# Patient Record
Sex: Male | Born: 1935
Health system: Southern US, Community
[De-identification: ages and names within clinical notes are randomized; demographics above are authoritative.]

## PROBLEM LIST (undated history)

## (undated) DIAGNOSIS — N529 Male erectile dysfunction, unspecified: Secondary | ICD-10-CM

## (undated) DIAGNOSIS — K219 Gastro-esophageal reflux disease without esophagitis: Secondary | ICD-10-CM

## (undated) DIAGNOSIS — N400 Enlarged prostate without lower urinary tract symptoms: Secondary | ICD-10-CM

## (undated) DIAGNOSIS — E785 Hyperlipidemia, unspecified: Secondary | ICD-10-CM

## (undated) DIAGNOSIS — D696 Thrombocytopenia, unspecified: Secondary | ICD-10-CM

## (undated) DIAGNOSIS — R351 Nocturia: Secondary | ICD-10-CM

## (undated) DIAGNOSIS — Z87820 Personal history of traumatic brain injury: Secondary | ICD-10-CM

## (undated) DIAGNOSIS — Z85828 Personal history of other malignant neoplasm of skin: Secondary | ICD-10-CM

## (undated) DIAGNOSIS — K222 Esophageal obstruction: Secondary | ICD-10-CM

## (undated) DIAGNOSIS — K759 Inflammatory liver disease, unspecified: Secondary | ICD-10-CM

## (undated) DIAGNOSIS — K5790 Diverticulosis of intestine, part unspecified, without perforation or abscess without bleeding: Secondary | ICD-10-CM

## (undated) DIAGNOSIS — E039 Hypothyroidism, unspecified: Secondary | ICD-10-CM

## (undated) DIAGNOSIS — I259 Chronic ischemic heart disease, unspecified: Secondary | ICD-10-CM

## (undated) DIAGNOSIS — I10 Essential (primary) hypertension: Secondary | ICD-10-CM

## (undated) DIAGNOSIS — I214 Non-ST elevation (NSTEMI) myocardial infarction: Secondary | ICD-10-CM

## (undated) DIAGNOSIS — Z87442 Personal history of urinary calculi: Secondary | ICD-10-CM

## (undated) DIAGNOSIS — R350 Frequency of micturition: Secondary | ICD-10-CM

## (undated) HISTORY — DX: Hypothyroidism, unspecified: E03.9

## (undated) HISTORY — DX: Essential (primary) hypertension: I10

## (undated) HISTORY — DX: Male erectile dysfunction, unspecified: N52.9

## (undated) HISTORY — DX: Hyperlipidemia, unspecified: E78.5

## (undated) HISTORY — PX: APPENDECTOMY: SHX54

## (undated) HISTORY — DX: Chronic ischemic heart disease, unspecified: I25.9

## (undated) HISTORY — DX: Gastro-esophageal reflux disease without esophagitis: K21.9

## (undated) HISTORY — DX: Benign prostatic hyperplasia without lower urinary tract symptoms: N40.0

## (undated) HISTORY — DX: Non-ST elevation (NSTEMI) myocardial infarction: I21.4

---
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
## 1995-05-16 HISTORY — PX: CORONARY ARTERY BYPASS GRAFT: SHX141

## 1997-09-13 ENCOUNTER — Emergency Department (HOSPITAL_COMMUNITY): Admission: EM | Admit: 1997-09-13 | Discharge: 1997-09-13 | Payer: Self-pay | Admitting: Emergency Medicine

## 2000-09-15 ENCOUNTER — Encounter: Payer: Self-pay | Admitting: Emergency Medicine

## 2000-09-15 ENCOUNTER — Emergency Department (HOSPITAL_COMMUNITY): Admission: EM | Admit: 2000-09-15 | Discharge: 2000-09-16 | Payer: Self-pay | Admitting: Emergency Medicine

## 2000-09-16 ENCOUNTER — Encounter: Payer: Self-pay | Admitting: Emergency Medicine

## 2000-11-02 ENCOUNTER — Encounter: Payer: Self-pay | Admitting: Urology

## 2000-11-02 ENCOUNTER — Encounter: Admission: RE | Admit: 2000-11-02 | Discharge: 2000-11-02 | Payer: Self-pay | Admitting: Urology

## 2000-11-27 ENCOUNTER — Encounter: Admission: RE | Admit: 2000-11-27 | Discharge: 2000-11-27 | Payer: Self-pay | Admitting: Urology

## 2000-11-27 ENCOUNTER — Encounter: Payer: Self-pay | Admitting: Urology

## 2000-12-31 ENCOUNTER — Ambulatory Visit (HOSPITAL_COMMUNITY): Admission: RE | Admit: 2000-12-31 | Discharge: 2000-12-31 | Payer: Self-pay | Admitting: Urology

## 2000-12-31 ENCOUNTER — Encounter: Payer: Self-pay | Admitting: Urology

## 2001-01-17 ENCOUNTER — Encounter: Payer: Self-pay | Admitting: Urology

## 2001-01-17 ENCOUNTER — Encounter: Admission: RE | Admit: 2001-01-17 | Discharge: 2001-01-17 | Payer: Self-pay | Admitting: Urology

## 2003-07-08 ENCOUNTER — Inpatient Hospital Stay (HOSPITAL_COMMUNITY): Admission: RE | Admit: 2003-07-08 | Discharge: 2003-07-12 | Payer: Self-pay | Admitting: Gastroenterology

## 2003-07-13 ENCOUNTER — Inpatient Hospital Stay (HOSPITAL_COMMUNITY): Admission: EM | Admit: 2003-07-13 | Discharge: 2003-07-16 | Payer: Self-pay | Admitting: *Deleted

## 2003-09-18 ENCOUNTER — Ambulatory Visit (HOSPITAL_COMMUNITY): Admission: RE | Admit: 2003-09-18 | Discharge: 2003-09-18 | Payer: Self-pay | Admitting: Gastroenterology

## 2003-09-29 ENCOUNTER — Encounter: Admission: RE | Admit: 2003-09-29 | Discharge: 2003-09-29 | Payer: Self-pay | Admitting: Gastroenterology

## 2006-01-03 ENCOUNTER — Inpatient Hospital Stay (HOSPITAL_BASED_OUTPATIENT_CLINIC_OR_DEPARTMENT_OTHER): Admission: RE | Admit: 2006-01-03 | Discharge: 2006-01-03 | Payer: Self-pay | Admitting: Cardiology

## 2008-05-15 DIAGNOSIS — I214 Non-ST elevation (NSTEMI) myocardial infarction: Secondary | ICD-10-CM

## 2008-05-15 HISTORY — DX: Non-ST elevation (NSTEMI) myocardial infarction: I21.4

## 2008-09-07 ENCOUNTER — Ambulatory Visit: Payer: Self-pay | Admitting: Cardiology

## 2008-09-08 ENCOUNTER — Inpatient Hospital Stay (HOSPITAL_COMMUNITY): Admission: EM | Admit: 2008-09-08 | Discharge: 2008-09-10 | Payer: Self-pay | Admitting: *Deleted

## 2008-09-08 HISTORY — PX: CARDIAC CATHETERIZATION: SHX172

## 2009-03-19 ENCOUNTER — Encounter: Admission: RE | Admit: 2009-03-19 | Discharge: 2009-03-19 | Payer: Self-pay | Admitting: Cardiology

## 2010-03-23 ENCOUNTER — Ambulatory Visit: Payer: Self-pay | Admitting: Cardiology

## 2010-04-27 ENCOUNTER — Ambulatory Visit: Payer: Self-pay | Admitting: Cardiology

## 2010-07-27 ENCOUNTER — Ambulatory Visit (INDEPENDENT_AMBULATORY_CARE_PROVIDER_SITE_OTHER): Payer: Self-pay | Admitting: Cardiology

## 2010-07-27 DIAGNOSIS — I2510 Atherosclerotic heart disease of native coronary artery without angina pectoris: Secondary | ICD-10-CM

## 2010-08-24 LAB — DIFFERENTIAL
Basophils Absolute: 0 10*3/uL (ref 0.0–0.1)
Basophils Relative: 0 % (ref 0–1)
Eosinophils Absolute: 0.1 10*3/uL (ref 0.0–0.7)
Eosinophils Relative: 1 % (ref 0–5)
Monocytes Absolute: 0.6 10*3/uL (ref 0.1–1.0)
Neutro Abs: 4.3 10*3/uL (ref 1.7–7.7)

## 2010-08-24 LAB — CK TOTAL AND CKMB (NOT AT ARMC)
CK, MB: 14.2 ng/mL — ABNORMAL HIGH (ref 0.3–4.0)
Relative Index: 5.2 — ABNORMAL HIGH (ref 0.0–2.5)
Total CK: 271 U/L — ABNORMAL HIGH (ref 7–232)

## 2010-08-24 LAB — LIPID PANEL
HDL: 53 mg/dL (ref >39)
Total CHOL/HDL Ratio: 2.4 RATIO
Triglycerides: 34 mg/dL (ref <150)

## 2010-08-24 LAB — BASIC METABOLIC PANEL
BUN: 17 mg/dL (ref 6–23)
CO2: 26 mEq/L (ref 19–32)
Chloride: 103 mEq/L (ref 96–112)
Chloride: 105 mEq/L (ref 96–112)
Creatinine, Ser: 1.2 mg/dL (ref 0.4–1.5)
Creatinine, Ser: 1.21 mg/dL (ref 0.4–1.5)
GFR calc Af Amer: >60 mL/min (ref >60)
GFR calc Af Amer: >60 mL/min (ref >60)
GFR calc non Af Amer: 58 mL/min — ABNORMAL LOW (ref >60)
Potassium: 3.8 mEq/L (ref 3.5–5.1)
Potassium: 4.2 mEq/L (ref 3.5–5.1)

## 2010-08-24 LAB — CBC
HCT: 40.7 % (ref 39.0–52.0)
Hemoglobin: 14 g/dL (ref 13.0–17.0)
MCHC: 33.4 g/dL (ref 30.0–36.0)
MCHC: 34.3 g/dL (ref 30.0–36.0)
MCV: 97.7 fL (ref 78.0–100.0)
MCV: 97.9 fL (ref 78.0–100.0)
MCV: 98.4 fL (ref 78.0–100.0)
Platelets: 104 10*3/uL — ABNORMAL LOW (ref 150–400)
RBC: 4.19 MIL/uL — ABNORMAL LOW (ref 4.22–5.81)
RBC: 4.77 MIL/uL (ref 4.22–5.81)
RDW: 14.3 % (ref 11.5–15.5)
WBC: 4.4 10*3/uL (ref 4.0–10.5)
WBC: 5 10*3/uL (ref 4.0–10.5)
WBC: 6.1 10*3/uL (ref 4.0–10.5)

## 2010-08-24 LAB — POCT I-STAT, CHEM 8
BUN: 19 mg/dL (ref 6–23)
Calcium, Ion: 1.13 mmol/L (ref 1.12–1.32)
Creatinine, Ser: 1.3 mg/dL (ref 0.4–1.5)
Glucose, Bld: 124 mg/dL — ABNORMAL HIGH (ref 70–99)
TCO2: 24 mmol/L (ref 0–100)

## 2010-08-24 LAB — CARDIAC PANEL(CRET KIN+CKTOT+MB+TROPI)
Relative Index: 3.8 — ABNORMAL HIGH (ref 0.0–2.5)
Troponin I: 1.51 ng/mL (ref 0.00–0.06)

## 2010-08-24 LAB — URINALYSIS, ROUTINE W REFLEX MICROSCOPIC
Hgb urine dipstick: NEGATIVE
Ketones, ur: NEGATIVE mg/dL
Protein, ur: NEGATIVE mg/dL
Urobilinogen, UA: 1 mg/dL (ref 0.0–1.0)

## 2010-08-24 LAB — MAGNESIUM: Magnesium: 1.9 mg/dL (ref 1.5–2.5)

## 2010-08-24 LAB — APTT: aPTT: 34 seconds (ref 24–37)

## 2010-08-24 LAB — TROPONIN I: Troponin I: 2.05 ng/mL (ref 0.00–0.06)

## 2010-09-27 NOTE — Discharge Summary (Signed)
NAME:  Patrick [name], [place]              ACCOUNT NO.:  [id number]   MEDICAL RECORD NO.:  [id number]           PATIENT TYPE:   LOCATION:                                 FACILITY:   PHYSICIAN:  Colleen Can. Deborah Chalk, M.D.DATE OF BIRTH:  26-Dec-1926   DATE OF ADMISSION:  DATE OF DISCHARGE:                               DISCHARGE SUMMARY   DISCHARGE DIAGNOSES:  1. Non-ST elevation myocardial infarction status post cardiac      catheterization showing normal left ventricular function, totally      occluded right coronary, totally occluded left circumflex, totally      occluded left anterior descending in the midportion with a proximal      diffuse 60% narrowing prior to a reasonably large first diagonal      vessel.  The left internal mammary graft is patent in diagonal 2      and the left anterior descending, saphenous vein graft to the acute      margin is patent, and there is a patent saphenous vein graft to the      intermediate obtuse marginal 2 and obtuse marginal 3 sequentially.      In summary, all vein grafts are open with the exception of the limb      of the vein graft to the posterior descending vessel that was a      sequential graft and the anastomosis of the left internal mammary      artery graft to the diagonal 1.  It does appear that both those      areas have what appear to be now satisfactory flow.  Mr. Laws      will continue with medical management.  2. Known ischemic heart disease with remote bypass surgery in 1997.  3. Significant situational stress.  4. Hyperlipidemia.  5. Hypothyroidism.  6. Benign prostatic hypertrophy.  7. Hypertension.  8. Gastroesophageal reflux disease.  9. History of gout.  10.History of peptic ulcer disease.   HISTORY OF PRESENT ILLNESS:  The patient is a very pleasant 77 year old  white male who has multiple medical problems.  He presented for elective  admission.  He several days prior to admission had had an episode of  significant chest  discomfort, described as a crampy and indigestion-like  sensation.  It was associated with shortness of breath.  This occurred  in midst of an argument with his significant other.  He did not take any  medicines for this discomfort and over the course of the 10-20 minutes,  it abated.  On the morning prior to admission, he again had significant  chest discomfort.  He was seen in the office.  His EKG was unremarkable.  He subsequently had labs drawn, and plans were made for him to have an  outpatient stress test.  Unfortunately, his troponins were positive.  The patient was then called and brought to the emergency room and  subsequently admitted.  He was currently pain free.   Please see the history and physical for further patient presentation and  profile.   LABORATORY DATA:  On admission, his white count was  6, hemoglobin 15,  hematocrit 46, platelets were 129.  Chemistry showed sodium 136,  potassium 3.8, chloride 103, CO2 21, BUN 17, creatinine 1.2, and a  glucose of 133.  His peak troponin was 2.0, peak CK-MB was 14.5.   His chest x-ray showed no active disease.  His EKG showed no acute  changes.   HOSPITAL COURSE:  The patient was admitted.  He was placed on IV  nitroglycerin and IV heparin.  We made plans for him to have cardiac  catheterization the following afternoon.  That procedure was performed  without any new known complications, the results are as noted above.  It  is felt that he can be managed medically.  Postprocedure, he was  transferred to 2500.  He has been placed on beta-blocker therapy and has  also had antiplatelet therapy reestablished as well.  He has basically  done well throughout the remainder of his hospitalization with no  further episodes of chest pain.  He has been up and ambulatory without  problems and felt to be a satisfactory candidate for discharge today.   DISCHARGE CONDITION:  Stable.   DISCHARGE DIET:  Low salt, heart healthy.   ACTIVITY:   Increased slowly.  He is not to drive or engage in sexual  activity.   He is to use an ice pack if needed to the groin.   DISCHARGE MEDICINES:  1. Uroxatral 10 mg a day.  2. Allopurinol 300 mg a day.  3. Lexapro 10 mg a day.  4. Synthroid 75 mcg a day.  5. Xanax p.r.n.  6. Calcium tablets as before.  7. Lipitor has been switched over to Crestor 10 mg a day.  8. Plavix 75 mg a day.  9. Enteric-coated aspirin 325 a day.  10.Nitroglycerin p.r.n.  11.Lopressor 25 b.i.d.  12.Pepcid 20 mg each night.   We plan on seeing him back in the office in 1 week, certainly sooner if  any problems would arise in the interim.   Greater than 30 minutes spent for discharge.      Sharlee Blew, N.P.      Colleen Can. Deborah Chalk, M.D.  Electronically Signed    LC/MEDQ  D:  09/10/2008  T:  09/10/2008  Job:  [id number]

## 2010-09-27 NOTE — Cardiovascular Report (Signed)
NAME:  Patrick [name], Patrick [name]              ACCOUNT NO.:  [id number]   MEDICAL RECORD NO.:  [id number]          PATIENT TYPE:  INP   LOCATION:  2502                         FACILITY:  [facility]   PHYSICIAN:  Colleen Can. Deborah Chalk, M.D.DATE OF BIRTH:  07/10/1926   DATE OF PROCEDURE:  09/08/2008  DATE OF DISCHARGE:                            CARDIAC CATHETERIZATION   PROCEDURE:  Left heart catheterization with selective coronary  angiography, left ventricular angiography, and saphenous vein graft  angiography x2 with angiography of the left internal mammary artery.   TYPE AND SITE OF ENTRY:  Percutaneous right femoral artery.   CATHETERS:  A 6-French 4-curved Judkins right and left coronary  catheter, 6-French pigtail ventriculographic catheter, and 6-French left  internal mammary graft catheter.   CONTRAST MATERIAL:  Omnipaque.   MEDICATIONS GIVEN PRIOR TO PROCEDURE:  Valium 10 mg p.o.   MEDICATIONS GIVEN DURING PROCEDURE:  Versed 2 mg IV.   COMMENTS:  The patient tolerated the procedure well.   ANGIOGRAPHIC DATA:  The aortic pressure is 125/64, LV was 133/5-21.  There is no aortic valve gradient noted on pullback.   The left ventricular angiogram was performed in the RAO position.  Overall cardiac size and silhouette were normal.  Left ventricular  function was normal with a global ejection fraction of 60%.   1. Left main coronary artery was normal.  2. Left anterior descending had proximal diffuse disease of      approximately 60% narrowing.  After this narrowing, a moderately      large diagonal vessel rose.  It had 50-60% narrowing in the      proximal portion of it.  The left anterior descending was      subsequently occluded after that but only after the origin of least      2 large septal perforating branches.  3. Left circumflex was totally occluded within the first proximal 2      cm.  4. Right coronary artery was totally occluded within the proximal 3-4      cm.  5. Saphenous  vein graft in a sequential manner to the intermediate,      obtuse marginal 2, and obtuse marginal 3 was widely patent.  In the      body of the graft, after the insertion in the second obtuse      marginal, there was 50% narrowing.  This was located at a valve in      the vein.  This was just proximal to the insertion into the obtuse      marginal 2.  There was good distal runoff.  6. Saphenous vein graft to the acute marginal and posterior      descending.  This saphenous vein graft has somewhat diffuse      proximal disease of 50-60% narrowing.  The vein graft inserted into      the large acute marginal and this supplied blood flow to the      inferior wall of the heart.  There was subsequent collaterals back      to the posterior descending vessel.  The limb of the saphenous  vein      graft that would have appeared to have gone directly to the      posterior descending is occluded just at the level of the acute      marginal vessel.  7. The left internal mammary graft originally went to the diagonal 1,      diagonal 2, and left anterior descending in a sequential manner.      The left internal mammary artery is patent to the diagonal 2 and to      the left anterior descending and has a good distal runoff.  There      does not appear to be any graft flow to the first diagonal vessel.      The first diagonal vessel receives its flow directly from the      native circulation.   OVERALL IMPRESSION:  1. Normal left ventricular function.  2. Totally occluded right coronary artery, totally occluded left      circumflex, and totally occluded left anterior descending in the      midportion with proximal diffuse 60% narrowing prior to a      reasonably large first diagonal vessel.  3. Patent left internal mammary graft to diagonal 2 and left anterior      descending, patent saphenous vein graft to the acute marginal, with      a patent saphenous vein graft to the intermediate, obtuse  marginal      2, and obtuse marginal 3 mL sequentially.   In summary, all the vein grafts are open with the exception of the limb  of the vein graft to the posterior descending vessel that was a  sequential graft and the anastomosis off the left internal mammary  artery graft to the diagonal 1.  It does appear that both of those areas  have what appeared to be satisfactory flow.   DISCUSSION:  It is felt that probably under the situation of stress,  collateral flow was diminished leading to the subendocardial infarction.  We will manage Patrick [name] with medical management.      Colleen Can. Deborah Chalk, M.D.  Electronically Signed     SNT/MEDQ  D:  09/08/2008  T:  09/09/2008  Job:  [id number]

## 2010-09-27 NOTE — H&P (Signed)
NAME:  Patrick [name], [place]              ACCOUNT NO.:  [id number]   MEDICAL RECORD NO.:  [id number]          PATIENT TYPE:  INP   LOCATION:  1823                         FACILITY:  [facility]   PHYSICIAN:  Darryl D. Prime, MD    DATE OF BIRTH:  11-Sep-1926   DATE OF ADMISSION:  09/07/2008  DATE OF DISCHARGE:                              HISTORY & PHYSICAL   CODE STATUS:  Full code.   PRIMARY CARE PHYSICIAN/CARDIOLOGIST:  Colleen Can. Deborah Chalk, M.D.   CHIEF COMPLAINT:  Chest pain.   HISTORY OF PRESENT ILLNESS:  Patrick [name] is a 77 year old male with a  history of coronary artery disease.  He is status post bypass grafting  10-15 years ago who comes in with chest pain.  The patient notes on  September 06, 2008, at around 9 p.m., he was arguing with his wife and notes  severe chest discomfort, cramp, left precordial area with no radiation.  No sweats or sickness to the stomach but did have associated shortness  of breath.  He did not take anything for it over the course of 10-20  minutes.  The patient notes he bent down his head in order to relieve  the pain.  He notes the chest pain, however, is similar to his anginal  equivalent.  The patient, this morning, again had the pain that is less  severe, but it reminded him that he had to get it taken care of.  He  called Dr. Ronnald Nian nurse and then went to clinic there and had EKG  done which was unremarkable.  Labs were drawn including cardiac markers.  Cardiac labs were called to me at around 11:00 p.m. from [facility] which got labs from [employer] who noted his troponin to be  2.6, MV 18.6.  His CK was 296.  I then got a hold of the patient and had  him come to the emergency room.  The patient, in the emergency room,  received 25 mg of aspirin and was placed on heparin drip.  He has had no  chest pain since this morning.   ALLERGIES:  HALCION CAUSES HALLUCINATION, ACE INHIBITOR CAUSE A COUGH.   MEDICATIONS:  He is unsure of what medications  he is on, did not bring  his list.  They are going to go get that list and hopefully we should  have it in the next hour or two.  In 2007, he was on:  1. Lipitor 10 mg daily.  2. Synthroid 0.075 mg daily.  3. Aspirin daily.  4. Diovan 160/12.5 mg daily.  5. Allopurinol daily.  6. Prilosec daily.   PAST MEDICAL HISTORY:  As above.  Last cardiac catheterization was on  January 03, 2006.  It showed total occluded left circumflex, totally  occluded left main coronary artery essentially normal.  LAD had a 70-80%  stenosis approximately just before the level of the first diagonal with  competitive flow distally from the bypass graft.  The saphenous vein  graft to the obtuse marginal PDA and a saphenous vein graft that had  mild proximal narrowing with tortuosity.  He had a saphenous vein graft  going to the obtuse marginal 1 and then to obtuse marginal 2 in a  sequential fashion with sequential saphenous vein graft with the vein  graft then largely patent with 50% narrowing at the level of the  insertion of OM 1.  No intervention was done at that time, although,  there was a question of whether or not he was having angina from the 80%  native LAD proximal lesion versus the lesion on the vein graft.  The  patient has done well since then.  The patient had last stress test he  cannot recall, but the last one he notes was unremarkable.  He was  scheduled for a stress test this Wednesday with the chest pain he was  having.  1. Status post appendectomy, has history of small bowel obstruction,      incarcerated hernia after the appendectomy.  2. History of hypertension.  3. Hyperlipidemia.  4. Gout.  5. Hypothyroidism.  6. Peptic ulcer disease.   SOCIAL HISTORY:  He is retired.  He is a former Conservation officer, nature, former  marine.  He also worked at [employer].  History of 5-6  years of two packs per day tobacco, quit 40 years ago.  No alcohol.   FAMILY HISTORY:  Positive for mother  who died due to complications of  congestive heart failure at the age of 39.   REVIEW OF SYSTEMS:  A 14-point review of systems is negative as stated  above.  He is full code.   PHYSICAL EXAMINATION:  VITAL SIGNS:  Temperature 98.2, pulse 79,  respirations 18, blood pressure 117/60.  GENERAL:  He is thin male lying flat in bed looking much younger than  his stated age in no acute distress.  HEENT:  Normocephalic, atraumatic.  Pupils equal, round and reactive to  light.  Extraocular movements intact.  Oropharynx is dry with no  thyromegaly, no lymphadenopathy.  No carotid bruits over the neck.  No  jugular venous distention.  LUNGS:  Clear to auscultation bilaterally.  CARDIOVASCULAR:  Regular rate and rhythm with no murmurs, rubs or  gallops, normal S1, S2.  No S3 or S4.  ABDOMEN:  Soft, nontender, nondistended.  There is no  hepatosplenomegaly.  EXTREMITIES:  No cyanosis, clubbing or edema .  NEUROLOGICAL:  Alert and oriented x4.  Cranial nerves II-XII grossly  intact.  Strength and sensation are grossly intact.  SKIN:  No rashes.   STUDIES:  Chest x-ray shows no acute cardiopulmonary disease.  EKG  showed normal sinus rhythm with event rate of 74 beats per minute with a  left anterior fascicular block.  There is a possible inferior posterior  infarct.  There is no major change compared to EKG done July 13, 2003.   LABORATORY DATA:  White count 6.1, hemoglobin 15.7, hematocrit 46.9,  platelets 129, segs of 71%, lymphocytes 18%.  Has previously platelet  count on July 14, 2003, showed a platelet count of 211.  Sodium 136,  potassium 3.8, chloride 103, bicarbonate 27, glucose 133.  Cardiac  markers at 30 minutes after midnight showed a CK MB of 9.1, troponin  0.41.  INR 1.1 with PT 14.5, PTT 34.   ASSESSMENT/PLAN:  This is a patient with a history of significant  coronary artery disease status post  coronary artery bypass grafting.  He has a history of his LV gram on August  2007 showed an ejection  fraction of 60% with normal wall  motion.  He has done very well since  his bypass surgery.  He now presents with chest pain, positive  troponins.  The story is concerning for unstable angina/NSTEMI.  At this  time, he will be admitted to the CCU and  be continued on heparin and will start Integrilin.  We will start a beta  blocker, metoprolol, 12.5 mg q.6 h and hold his ARB for now as he may  have a cardiac catheterization.  He will be on telemetry.  We will  continue his statin therapy, will increase Lipitor to 80 mg daily.  DVT  and GI prophylaxis will be ordered.      Darryl D. Prime, MD  Electronically Signed     DDP/MEDQ  D:  09/08/2008  T:  09/08/2008  Job:  [id number]

## 2010-09-30 NOTE — Consult Note (Signed)
NAME:  Patrick [name], Patrick [name] H                        ACCOUNT NO.:  [id number]   MEDICAL RECORD NO.:  [id number]                   PATIENT TYPE:  INP   LOCATION:  3701                                 FACILITY:  [facility]   PHYSICIAN:  Patrick Dare. Janee [name], M.D.             DATE OF BIRTH:  29-May-1926   DATE OF CONSULTATION:  07/09/2003  DATE OF DISCHARGE:                                   CONSULTATION   SURGERY CONSULTATION   REASON FOR CONSULTATION:  Abdominal wall hernia with small bowel  obstruction.   HISTORY OF PRESENT ILLNESS:  The patient is a 77 year old white male  admitted yesterday to Dr. Molly Maduro Buccini's service with nausea, vomiting,  abdominal pain.  This came on a couple of days ago with some associated  generalized weakness and malaise.  He had a large amount of vomiting  yesterday as well.  He has a little bit of dizziness associated with  weakness.  His last bowel movement was very small, was on Tuesday.  The  patient does note that he had a lump over his old appendectomy site down on  his abdomen for many years.  He has not noticed this getting significantly  bigger lately but he states it stuck out.  His work up in the hospital here  has included upper endoscopy by Dr. Matthias Hughs which showed hiatal hernia and a  large retained gastric content but no other significant abnormalities.  He  also underwent a CT scan of the abdomen and pelvis today which shows hernia  of small bowel through the abdominal wall with associated obstruction down  in the left lower quadrant.   PAST MEDICAL HISTORY:  Coronary artery disease, hypertension, kidney stones.   PAST SURGICAL HISTORY:  Coronary artery bypass grafting in 1998.  He had a  previous percutaneous transluminal coronary angioplasty prior to this.   SOCIAL HISTORY:  The patient does not drink or smoke.   CURRENT MEDICATIONS:  1. Toprol XL 25 mg p.o. daily.  2. Allopurinol 100 mg p.o. daily.  3. Protonix intravenously 40 mg daily.  4. Benadryl PRN.  5. Phenergan PRN.  6. Demerol PRN.   ALLERGIES:  HALCION.   REVIEW OF SYMPTOMS:  GENERAL:  He feels weak.  CARDIAC:  No current chest  pain.  PULMONARY:  No shortness of breath or other complaints.  GASTROINTESTINAL:  See history of present illness.  GENITOURINARY:  No  complaints.  MUSCULOSKELETAL:  No complaints.   PHYSICAL EXAMINATION:  VITAL SIGNS:  Temperature 98.8, blood pressure  120/74, heart rate 86, respirations 20, oxygen saturation 96% on room air.  GENERAL:  He is awake, alert and in no acute distress.  HEENT: Pupils equal, round, reactive to light.  Sclerae nonicteric.  NECK:  Supple with no masses.  LUNGS:  Clear to auscultation with normal respiratory excursion.  HEART:  Regular rate and rhythm with PMI palpable in left chest.  Remainder  of  the cardiothoracic examination has good distal pulses and no significant  peripheral edema.  ABDOMEN:  Moderately distended and tympanitic.  There are a few hyperactive  bowel sounds present.  There is mild tenderness throughout and he does have  a palpable incarcerated hernia over his old right lower quadrant incision at  the site of his old appendectomy site.  This does not reduce.  It is not red  or significantly tender to manipulation or palpation.  SKIN:  Warm and dry with no rashes.   LABORATORY DATA:  Review includes white blood cell count 11, hemoglobin  16.6, platelet count 182,000.  Liver function tests within normal limits.  Basic metabolic profile is within normal limits except for BUN of 26 and  glucose of 155.  Amylase is 107, lipase is 29   IMPRESSION:  Incarcerated incisional hernia of abdominal wall.   PLAN:  Plan is to take the patient to the operating room for release of his  small bowel obstruction and repair of this abdominal wall incisional hernia  at his old appendectomy site.  The procedure's risks and benefits were  discussed in detail with the patient as with Dr. Matthias Hughs and Dr.  Matthias Hughs  will speak with Dr. Deborah Chalk who is the patient's cardiologist as well.  The  patient is agreeable to surgery.  Questions were answered and we will  proceed this evening.                                               Patrick [name], M.D.    BET/MEDQ  D:  07/09/2003  T:  07/10/2003  Job:  [id number]

## 2010-09-30 NOTE — H&P (Signed)
NAME:  [place], [place] H                        ACCOUNT NO.:  [id number]   MEDICAL RECORD NO.:  [id number]                   PATIENT TYPE:  INP   LOCATION:  1823                                 FACILITY:  [facility]   PHYSICIAN:  Gabrielle Dare. Janee Morn, M.D.             DATE OF BIRTH:  22-Jul-1926   DATE OF ADMISSION:  07/13/2003  DATE OF DISCHARGE:                                HISTORY & PHYSICAL   CHIEF COMPLAINT:  Nausea and vomiting.   HISTORY OF PRESENT ILLNESS:  The patient is a 77 year old white male who  underwent repair of incarcerated right lower quadrant incisional hernia on  July 09, 2003 and a small bowel loop was trapped in the hernia and that  was reduced back into his abdomen. His hernia was repaired. Postoperatively  he recovered some GI function over the weekend and was quite insistent on  being discharged yesterday. Initially felt okay after he went home, but then  developed some vomiting during the night. He called our office and I advised  him to come to the emergency department where workup has revealed normal  white blood cell count, but an abdominal x-ray is consistent with small-  bowel obstruction. He denies any abdominal pain. There is no pain in his  incision site. No other complaints at this time.   PAST MEDICAL HISTORY:  1. Coronary artery disease, Dr. Duffy Rhody is his cardiologist.  2. Hypertension.  3. Kidney stones.   PAST SURGICAL HISTORY:  1. As above.  2. Coronary artery bypass grafting in 1998.  3. Multiple PTCAs prior to his coronary artery bypass graft.   SOCIAL HISTORY:  Does not drink or smoke.   CURRENT MEDICATIONS:  1. Toprol XL 25 mg p.o. daily.  2. Allopurinol 100 mg p.o. daily.  3. Pain medication he received on discharge.   ALLERGIES:  Halcion.   REVIEW OF SYSTEMS:  CARDIAC:  No chest pain. GENERAL:  Negative. PULMONARY:  No shortness of breath. GI:  Refer to the history of present illness. GU:  Negative. MUSCULOSKELETAL:   Negative.   PHYSICAL EXAMINATION:  GENERAL:  He is awake and alert.  VITAL SIGNS: Temperature 98.3, blood pressure 146/85, pulse 116,  respirations 18, saturations 99% on room air.  HEENT:  Pupils are equal and reactive. Sclerae is anicteric. He has a  nasogastric tube in place. Mucous membranes are somewhat dry.  NECK:  Supple.  LUNGS:  Clear to auscultation with good respiratory excursion.  HEART:  Regular S1, S2. PMI is palpable in the left chest.  ABDOMEN:  Moderately distended. Some bowel sounds are audible. There is no  significant tenderness. His incision is clean, dry, and intact with Steri-  Strips. No hepatosplenomegaly is noted. There is no palpable recurrence of  his hernia at this time.  SKIN:  Warm and dry with no rashes.  EXTREMITIES:  No large, significant edema.   LABORATORY DATA:  White blood cell count  6.0, sodium 136, potassium 3.6,  chloride 102, carbon dioxide 25, glucose 133. BUN 14, creatinine 1.3.  Calcium 8.7, platelets 200, hemoglobin 15.5. Abdominal series as above shows  distal small-bowel obstruction.   IMPRESSION/PLAN:  Recurrent distal small-bowel obstruction postoperatively.  He is not tender and has no white count elevation at this time. I will admit  him to the hospital, continue NG tube decompression, strict monitoring of  ins and outs. We will give some IV fluid resuscitation. I will do some  follow-up studies in the next day or two depending on how he progresses. I  have asked Dr. Roger Shelter to come by for management of his coronary  artery disease and hypertension. The above plan was discussed in detail with  the patient and his friend who is present. Questions were answered.                                                Gabrielle Dare Janee Morn, M.D.    BET/MEDQ  D:  07/13/2003  T:  07/13/2003  Job:  [id number]

## 2010-09-30 NOTE — Cardiovascular Report (Signed)
NAME:  [place], [place]              ACCOUNT NO.:  [id number]   MEDICAL RECORD NO.:  [id number]          PATIENT TYPE:  OIB   LOCATION:  1961                         FACILITY:  [facility]   PHYSICIAN:  Colleen Can. Deborah Chalk, M.D.DATE OF BIRTH:  1927-04-12   DATE OF PROCEDURE:  01/03/2006  DATE OF DISCHARGE:                              CARDIAC CATHETERIZATION   INDICATION FOR PROCEDURE:  Prolonged and severe chest pain with previous  bypass surgery 10 years earlier.   PROCEDURE:  Left heart catheterization with selective coronary angiography,  left ventricular angiography, saphenous vein graft angiography x2,  angiography of the left internal mammary artery.   TYPE OF SITE OF ENTRY:  Percutaneous right femoral artery.   CATHETERS:  A 4-French 4 curved Judkins right and left coronary catheters, 4-  [place] no-torque right catheter, 4-French left internal mammary graft  catheter, 4-French left coronary artery bypass graft catheter.   CONTRAST MATERIAL:  Omnipaque.   MEDICATIONS GIVEN PRIOR TO PROCEDURE:  Valium 10 mg.   MEDICATIONS GIVEN DURING THE PROCEDURE:  Versed 2 mg IV.   COMMENTS:  The patient tolerated the procedure well.   HEMODYNAMIC DATA:  Aortic pressure is 116/72, LV is 112/6-10.  There is no  aortic valve gradient noted on pullback.   ANGIOGRAPHIC DATA:  Left ventricular angiogram was performed in the RAO  position.  Overall cardiac size and silhouette are normal.  Global ejection  fraction is 60%.  Regional wall motion is normal.   CORONARY ARTERIES:  Coronary arteries arise and distribute normally.  1. Right coronary artery is totally occluded.  2. Left circumflex is totally occluded.  3. Left main coronary artery is essentially normal.  4. Left anterior descending has a 70-80% stenosis just before the level of      the first diagonal vessel.  There is competitive flow distally from      bypass graft.   Saphenous vein graft to the acute marginal posterior descending:   The  saphenous vein graft to the acute marginal posterior descending has mild  narrowing proximally with tortuosity.  It looks like there probably was a  native valve at this site.  Overall, it appears to be satisfactory flow into  the saphenous vein graft to the right coronary system with nice insertion  and good distal runoff.   Saphenous vein graft to the intermediate, obtuse marginal 1, and obtuse  marginal 2 in a sequential fashion.  The sequential saphenous vein graft all  three of these distal branches is patent and has a good insertions and  adequate distal runoff.  There is approximately 50% narrowing at the level  of the insertion into the first obtuse marginal (this would be the middle of  three distal anastomoses) but overall it is felt that there is satisfactory  distal flow and no real area of potential ischemia.   The left internal mammary graft in sequential fashion to the diagonal 1,  diagonal 2, and distal left anterior descending -- the sequential left  internal mammary graft has satisfactory flow to the distal left internal  mammary artery and to the second diagonal.  In the first diagonal there  appears to be rather robust antegrade flow and while I think that this  anastomoses is patent, the competitive flow by way of the left internal  mammary graft is relatively sparse.  However, I think that there is a degree  of patency and some late flow could be seen into the first diagonal vessel,  although there is more flow into the first diagonal vessel by injections  into the native coronary circulation.   OVERALL IMPRESSION:  1. Normal left ventricular function.  2. Severe three-vessel disease with totally occluded right coronary      artery, totally occluded left circumflex, and 80% proximal left      anterior descending.  3. Patent saphenous vein graft to sequential anastomoses in the right      coronary artery, patent saphenous vein graft to sequential  anastomosis      in the left circumflex system, but with mild to moderate narrowing with      insertion into what is labeled the first obtuse marginal (the middle      vessel of this sequential series); and patent left internal mammary      graft to the sequential distal lesions in the left anterior descending      system to include adequate flow into the left anterior descending and      second diagonal and probable competitive flow into the first diagonal,      but probably still persistent patency with the grafted vessel.   DISCUSSION:  The potential issues for Mr. Campoy include narrowing at that  anastomotic site of the marginal vessel and the narrowing in the proximal  left anterior descending.  However, it is felt that probably these areas do  not warrant intervention at this point in time.  It would be my impression  that the safest initial intervention would be in the left anterior  descending.  This would enhance antegrade flow into the septal perforating  systems off the left anterior descending.  The potential risk of that would  be occlusion of the first diagonal vessel but it is my opinion that there is  a patent distal left internal mammary graft to this distal diagonal that  would take overflow should that vessel be occluded during a stent placement.  It also raises the question as to whether or not there really is ischemia in  this distribution and whether the stent is really needed.  Overall, I think  it is wise to follow Mr. Resch medically at this point in time.  We may  want to consider a dual platelet inhibition.      Colleen Can. Deborah Chalk, M.D.  Electronically Signed     SNT/MEDQ  D:  01/03/2006  T:  01/03/2006  Job:  [id number]

## 2010-09-30 NOTE — H&P (Signed)
NAME:  [place], [place]              ACCOUNT NO.:  [id number]   MEDICAL RECORD NO.:  [id number]            PATIENT TYPE:   LOCATION:                                 FACILITY:   PHYSICIAN:  Colleen Can. Deborah Chalk, M.D.    DATE OF BIRTH:   DATE OF ADMISSION:  01/03/2006  DATE OF DISCHARGE:                                HISTORY & PHYSICAL   CHIEF COMPLAINT:  Chest pain.   HISTORY OF PRESENT ILLNESS:  Mr. Patrick [name] is a very pleasant 77 year old white  male. He was seen in our office as a work-in appointment on January 01, 2006  with new onset of chest discomfort. [place] It was clearly exertional in nature and was associated with  shortness of breath and profound fatigue. He was unsure if it was like his  previous chest pain syndrome. He does not use nitroglycerin due to severe  headache. He has had no symptoms at rest. He describes his discomfort as a  burning-type sensation. He was subsequently seen in our office and is now  referred for cardiac catheterization.   PAST MEDICAL HISTORY:  1. Known atherosclerotic cardiovascular disease. He had previous coronary      artery bypass grafting per Dr. Andrey Campanile in June of 1997 with left      internal mammary artery to the LAD, sequential graft to the first and      second diagonal, sequential graft to the intermediate first OM and      second OM, and a sequential graft to the acute marginal and posterior      descending. His last Cardiolite study was in September 2006 which      showed normal ejection fraction with no evidence of ischemia.  2. Hyperlipidemia. He is intolerant to Pravachol with myalgias.  3. Hypertension. He is intolerant to ACE inhibitors due to cough.  4. History of peptic ulcer disease.  5. History of gout.  6. Hypothyroidism.  7. Previous history of appendectomy.  8. Previous surgery for incarcerated hernia with subsequent small-bowel      obstruction in 2005.   ALLERGIES:   HALCION.   CURRENT MEDICATIONS:  1. Lipitor 10 mg a day.  2. Synthroid 0.075 mg daily.  3. Baby aspirin daily.  4. Diovan 160/12.5 daily.  5. Allopurinol daily.  6. Prilosec daily.   FAMILY HISTORY:  Noncontributory.   SOCIAL HISTORY:  He is divorced. He has no smoking history. He has social  alcohol use. 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   REVIEW OF SYSTEMS:  Basically as noted above and is otherwise unremarkable.  He has remained active. He has lost weight and notices a decrease in  appetite felt to be presumably due to the extreme weather. He has not been  lightheaded or dizzy and no complaints of syncope.   PHYSICAL EXAMINATION:  GENERAL:  On exam, he is a pleasant white male in no  acute distress.  VITAL SIGNS:  Blood pressure 116/60 sitting, 120/70 standing; heart rate  is  95; his weight is 166, down from 178; he is afebrile.  SKIN:  Warm and dry. Color is unremarkable.  LUNGS:  Clear.  HEART:  Shows a regular rhythm.  ABDOMEN:  Soft, positive bowel sounds, nontender.  EXTREMITIES:  Without edema.  NEUROLOGICAL:  Intact. There are no gross deficits.   STUDIES:  Chest x-ray shows previous bypass surgery with normal heart size.  EKG shows sinus rhythm. Other labs are pending.   OVERALL IMPRESSION:  1. New onset of chest pain.  2. Known atherosclerotic cardiovascular disease with previous history of      coronary artery bypass grafting x8 in June of 1997.  3. Hyperlipidemia.  4. Hypertension.   PLAN:  Will proceed on with repeat cardiac catheterization. The procedure  risks and benefits have been explained, and he is willing to proceed on  Wednesday, January 03, 2006.      Sharlee Blew, N.P.      Colleen Can. Deborah Chalk, M.D.  Electronically Signed    LC/MEDQ  D:  01/02/2006  T:  01/02/2006  Job:  [id number]

## 2010-09-30 NOTE — Discharge Summary (Signed)
NAME:  [place], [place] H                        ACCOUNT NO.:  [id number]   MEDICAL RECORD NO.:  [id number]                   PATIENT TYPE:  INP   LOCATION:  3701                                 FACILITY:  [facility]   PHYSICIAN:  Gabrielle Dare. Janee Morn, M.D.             DATE OF BIRTH:  1926-06-27   DATE OF ADMISSION:  07/08/2003  DATE OF DISCHARGE:  07/12/2003                                 DISCHARGE SUMMARY   DISCHARGE DIAGNOSES:  Status post repair of incarcerated incisional hernia.   HISTORY OF PRESENT ILLNESS:  The patient is a 77 year old white male who was  admitted by Dr. Bernette Redbird with nausea and vomiting with abdominal pain.  Included in his work up was a CT scan of the abdomen and pelvis which  demonstrated abdominal wall hernia at previous incision site of an old  appendectomy.  There was a small loop of small bowel within the hernia and  associated small bowel obstruction.   HOSPITAL COURSE:  I saw the patient in consultation after the above CT scan  was obtained.  He was subsequently taken urgently to the operating room that  evening where he underwent repair of the incisional hernia with release of  the small bowel obstruction.  Postoperatively he remained afebrile and  hemodynamically stable.  He had no further nausea or vomiting.  Diet was  gradually advanced.  Dr. Matthias Hughs signed off.  The patient did develop one  episode of recurrent vomiting on July 11, 2003 that subsequently went  away and he was kept NPO for a short time.  By the next day the patient had  moved his bowels with no further nausea.  His abdomen was flat and soft and  the patient was anxious to leave.  Patient was discharged home with  instructions for advancing his diet gradually on July 12, 2003.   DIET:  Liquids advanced to regular diet.   DISCHARGE MEDICATIONS:  Tylenol for any pain and resume home medications  which include Toprol XL 25 mg p.o. daily, aspirin 81 mg daily, also included  Allopurinol, Diovan and Synthroid.   FOLLOW UP:  The patient is to follow up with myself in one to two weeks.                                                Gabrielle Dare Janee Morn, M.D.    BET/MEDQ  D:  08/03/2003  T:  08/05/2003  Job:  [id number]

## 2010-09-30 NOTE — H&P (Signed)
NAME:  Patrick [name], [place] H                        ACCOUNT NO.:  [id number]   MEDICAL RECORD NO.:  [id number]                   PATIENT TYPE:  AMB   LOCATION:  ENDO                                 FACILITY:  [facility]   PHYSICIAN:  Bernette Redbird, M.D.                DATE OF BIRTH:  11-Sep-1926   DATE OF ADMISSION:  07/08/2003  DATE OF DISCHARGE:                                HISTORY & PHYSICAL   HISTORY OF PRESENT ILLNESS:  This 77 year old gentleman is being admitted to  the hospital because of nausea and severe weakness.   Patrick [name] has enjoyed generally good health apart from some coronary  disease - post previous PTCA's and bypass surgery in 1998, and some remote  ulcer disease.   With that background, he had super last night at [employer], and awakened  this morning with significant nausea and some nonspecific upper abdominal  soreness like a green apple stomach ache.  No actual severe pain.  He felt  like he could get relief if he vomited.  Note that he is on a daily aspirin.  He really could not get himself to vomit initially, but finally vomited once  some liquid brown material, and then subsequently some dark, tarry material.  He has felt very weak and dizzy.   In view of these symptoms and the way he looked at the office, inpatient  management was felt to be warranted.   ALLERGIES:  Halcion.   CURRENT MEDICATIONS:  1. __________.  2. Synthroid.  3. Daily baby aspirin.  4. Diovan.  5. Allopurinol.  6. Toprol XL.   OPERATIONS:  Remote appendectomy and coronary bypass surgery in  approximately 1998.  Also had previously had PTCA's in the early 1990's.   MEDICAL ILLNESSES:  1. Coronary disease, although no history of myocardial infarction.  2. Hypertension.  3. Kidney stones.  4. No history of chronic obstructive pulmonary disease or diabetes.   HABITS:  Nonsmoker, nondrinker.   FAMILY HISTORY:  Negative for gallstones, colon cancer, polyps, ulcers, or  alcoholism.   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   REVIEW OF SYSTEMS:  Negative for loss of appetite, loss of weight, dysphagia  symptoms, melena or any current melena or hematochezia, constipation or  diarrhea.  He had a normal brown stool this morning.  He does admit to some  exertional dyspnea.  To date, he is so weak that he can barely walk from one  chair to the next around his house.   PHYSICAL EXAMINATION:  GENERAL:  Unrevealing.  This is a well-developed,  well-nourished gentleman lying on the exam table when I cam in, appearing  somewhat weak and washed out, and in mild acute distress.  VITAL SIGNS:  Weight 182, blood pressure 130/80, pulse 52.  Temperature  96.7.  HEENT:  Oropharynx is benign.  Mucous  membranes are fairly moist.  He is  anicteric and without evidence pallor.  CHEST:  Clear to auscultation, no rales or wheezes.  HEART:  No gallops, rubs, murmurs, clicks or arrhythmias are appreciated.  ABDOMEN:  Bowel sounds are quiet.  Some slight fullness in the epigastric  area without significant tenderness.  No hepatosplenomegaly.  Certainly a  benign abdomen in the sense of no rebound or significant guarding.  RECTAL:  Shows no masses and light brown hemoccult negative stool.   LABORATORY DATA:  These are pending at the present time, but have been  drawn, including CBC, CMET, amylase and lipase.   Upper endoscopy:  Endoscopic evaluation was pertinent for a fairly-large  gastric residual, but no source of any GI bleeding, no anatomic gastric  outlet obstructing.  No source of he nausea was endoscopically evident.   IMPRESSION:  1. A 12-hour history of nausea, several episodes of emesis.  He has an     episode while here at the endoscopy unit.  Vague but not particularly     severe upper abdominal discomfort.  2. Remote history of ulcer disease, currently on aspirin.  3. Coronary disease, status  post percutaneous transluminal coronary     angioplasty and bypass surgery.  4. History of kidney stones.  5. Remote history of bypass surgery and appendectomy.  6. Large gastric residual without anatomic gastric outlet obstruction.  7. Small hiatal hernia with esophageal ring.   DISCUSSION:  It is not clear why the patient has developed the current  symptoms.  He could possibly have downstream obstruction of the small bowel,  food poisoning with a reactive bilious, or possibly acute pancreatitis,  although the minimal amount of pain would tend to go against that diagnosis.   PLAN:  1. Await lab results to see if they help clarify the picture (for example,     to see if there was elevation of amylase or lipase).  2. Symptomatic management with IV fluids, antiemetics as needed and pain     medication as needed.  3. We will cover the patient with PPI therapy despite the absence of any     ulcers seen on the current endoscopy.  4. Further management will depend on the findings of his labs, and his     clinical evolution, but I would have a low threshold for an abdominal CT     scan looking for re-current kidney stones and/or pancreatic disease if     his symptoms persist.                                                Bernette Redbird, M.D.    RB/MEDQ  D:  07/08/2003  T:  07/08/2003  Job:  [id number]   cc:   Colleen Can. Deborah Chalk, M.D.  Fax: [phone]

## 2010-09-30 NOTE — Consult Note (Signed)
NAME:  [place], [place] H                        ACCOUNT NO.:  [id number]   MEDICAL RECORD NO.:  [id number]                   PATIENT TYPE:  INP   LOCATION:  5742                                 FACILITY:  [facility]   PHYSICIAN:  Colleen Can. Deborah Chalk, M.D.            DATE OF BIRTH:  [birth year]   DATE OF CONSULTATION:  07/13/2003  DATE OF DISCHARGE:                                   CONSULTATION   Thank you very much for asking [place] to see Mr. Cervone.  He is a pleasant 77-  year-old male with known hypertension, coronary disease, and prior coronary  artery bypass grafting who presented last week with nausea, vomiting, and  abdominal pain.  Subsequent GI work-up demonstrated incarcerated hernia with  small-bowel obstruction.  He was operated on a Thursday, July 09, 2003,  and just discharged yesterday.  Unfortunately, he developed nausea and  vomiting again last evening and was readmitted.  He is on bowel rest with NG  drainage. He was referred for evaluation for management of medication.   PAST MEDICAL HISTORY:  1. He has a history of ischemic heart disease.  2. He had bypass surgery x8 in 1997.  His last Cardiolite was in July of     2004, with ejection fraction of 77% and no ischemia.  3. He has a history of hypercholesterolemia.  4. Hypertension.  5. Peptic ulcer disease.  6. Gout.  7. Hypothyroidism.  8. Appendectomy.   ALLERGIES:  None.   CURRENT MEDICATIONS:  1. Toprol XL 25 mg a day.  2. Lipitor 10 mg a day.  3. Synthroid 75 mcg a day.  4. Baby aspirin.  5. Diovan HCT 160-12.5.  6. Allopurinol.   FAMILY HISTORY:  Noncontributory.   SOCIAL HISTORY:  No smoking.  Occasional alcohol use.  He is married.   REVIEW OF SYMPTOMS:  No chest pain.  No shortness of breath.   PHYSICAL EXAMINATION:  VITAL SIGNS:  Blood pressure 146/85, heart rate 100,  respiratory rate 20, temperature 98.3.  SKIN:  Warm and dry.  Color is normal.  LUNGS:  Clear.  CARDIOVASCULAR:  Regular rate and  rhythm with a tachycardia.  ABDOMEN:  Somewhat protuberant.  Bowel sounds are present.  EXTREMITIES:  Without edema.  NEUROLOGIC:  Intact.   LABORATORY DATA:  Hematocrit is 45.  BUN is 14, creatinine 1.3, glucose 133,  potassium 3.6.   EKG shows sinus tachycardia.   IMPRESSION:  1. Recurrent small-bowel obstruction.  2. Recent surgery for incarcerated hernia.  3. Coronary artery disease with prior coronary artery bypass grafting.  4. Hypertension.  5. Hypercholesterolemia.   PLAN:  He is stable from our standpoint but will place him on IV medications  including IV Enalapril and IV beta-blocker.  Hold other medicines for the  short-term at this point in time.  Colleen Can. Deborah Chalk, M.D.    SNT/MEDQ  D:  07/13/2003  T:  07/13/2003  Job:  [id number]

## 2010-09-30 NOTE — Op Note (Signed)
NAME:  [place], [place] H                        ACCOUNT NO.:  [id number]   MEDICAL RECORD NO.:  [id number]                   PATIENT TYPE:  AMB   LOCATION:  ENDO                                 [facility]ACILITY:  [facility]   PHYSICIAN:  Bernette Redbird, M.D.                DATE OF BIRTH:  [birth year]   DATE OF PROCEDURE:  09/18/2003  DATE OF DISCHARGE:                                 OPERATIVE REPORT   PROCEDURE:  Colonoscopy.   INDICATIONS FOR PROCEDURE:  Screening for colon cancer in a 77 year old  asymptomatic individual without prior screening.   FINDINGS:  Extensive diverticulosis.  Possible tiny prostatic nodule.   DESCRIPTION OF PROCEDURE:  The nature, purpose and risk of the procedure had  been discussed with the patient who provided written consent. Sedation was  fentanyl 45 mcg and Versed 4 mg IV without arrhythmias or desaturation.  Digital exam of the prostate appeared to show very tiny superficial nodule  on the right lobe, almost mobile and certainly not tender or large.  The  remainder of the prostate was unremarkable. The Olympus adjustable tension  pediatric video colonoscope was readily advanced to the terminal ileum which  had a normal appearance and pullback was then performed throughout the  colon. The quality of the prep was excellent and it is felt that all areas  were well seen.   The patient had fairly extensive diverticulosis pretty much from the region  of the hepatic flexure distally.  No polyps, cancer, colitis or vascular  malformations were observed. Retroflexion of the rectum and reinspection of  the rectum was unremarkable. No biopsies were obtained.  The patient  tolerated the procedure well and there were no apparent complications.   IMPRESSION:  1. Screening colonoscopy without worrisome findings identified (V76.51).  2. Extensive moderate diverticulosis.  3. Possible right prostatic nodule.   PLAN:  1. Consider sigmoidoscopic evaluation in five years for  ongoing screening.  2. Consider urologic evaluation.                                               Bernette Redbird, M.D.    RB/MEDQ  D:  09/18/2003  T:  09/18/2003  Job:  [id number]   cc:   Colleen Can. Deborah Chalk, M.D.  Fax: [phone]

## 2010-09-30 NOTE — Op Note (Signed)
NAME:  Patrick [name], [place] H                        ACCOUNT NO.:  [id number]   MEDICAL RECORD NO.:  [id number]                   PATIENT TYPE:  INP   LOCATION:  3701                                 FACILITY:  [facility]   PHYSICIAN:  Bernette Redbird, M.D.                DATE OF BIRTH:  05-04-1927   DATE OF PROCEDURE:  07/08/2003  DATE OF DISCHARGE:                                 OPERATIVE REPORT   PROCEDURE:  Upper endoscopy.   ENDOSCOPIST:  Florencia Reasons, M.D.   INDICATIONS:  [age]-year-old gentleman who presented to the office in  referral from Dr. Deborah Chalk, because of a roughly 12 hour history of nausea,  several episodes of emesis (including one which was very dark), a history of  aspirin exposure and severe weakness and nonspecific upper abdominal pain.  The presumption was either GI bleeding secondary to ulcer disease or perhaps  gastric outlet obstruction related to ulcer disease.   FINDINGS:  Moderately large gastric residual but otherwise unremarkable  exam.   INFORMED CONSENT:  The nature, purpose and risks of the procedure have been  discussed with the patient who provided written consent.   DESCRIPTION OF PROCEDURE:  He went from our office to the endoscopy unit on  route to his hospital floor bed assignment.  Sedation was fentanyl 40 mcg  and Versed 6 mg IV without arrhythmias or desaturation.   The Olympus small caliber adult video endoscope was passed under direct  vision.  The vocal cords looked normal.   The esophagus had some material refluxing up into it and had focal red  telangiectatic-appearing lesions scattered here and there.  There was no  frank active esophagitis.  No evidence of Barrett's mucosa, no varices  although there were some prominent esophageal veins present consistent with  his advanced age.  There was a well-defined esophageal mucosal ring at the  squamocolumnar junction and below this was a 2 cm hiatal hernia.   The stomach was entered.   Even though the patient had just vomited about 400  mL of dark green material, there was an additional 200 mL of such fluid  within the stomach, most of which could be suctioned out.  However, there  was some particulate vegetable matter (? corn) present even though the  patient had not eaten for approximately 24 hours.  Thus, the entire residual  could not be suctioned out.   The gastric mucosa was benign.  I saw no evidence of aspirin gastropathy nor  any ulcers, erosions, diffuse gastritis, polyps or masses including a  retroflexed view of the proximal stomach.   Normal antral peristalsis was present.   The pylorus was patent, without any stenosis.  The duodenal bulb was  pristine, without any evidence of scarring from previous ulcer disease.  The  second duodenum looked normal.  These areas were rechecked, and again, no  evidence of ulcer disease could be seen, much  less any obstruction, edema,  inflammatory changes, etc.   The scope was removed from the patient.  He tolerated the procedure well,  and there were no apparent complications.   IMPRESSION:  1. Moderate gastric residual, despite recent vomiting, suggestive of     functional impairment of gastric emptying or less likely a downstream     obstruction.  2. No blood or coffee ground material, ulcer disease or other source of     bleeding despite recent history of black emesis.  3. No anatomic gastric outlet obstruction; the pyloric channel was widely     patent.  4. Small hiatal hernia with esophageal ring, which is apparently     asymptomatic.  5. Possible esophageal telangiectasia.   PLAN:  Await lab results to help guide further management.  In the meantime,  inpatient symptomatic management with IV fluids, anti-emetics and analgesics  if needed, and empiric coverage with PPI therapy.                                               Bernette Redbird, M.D.    RB/MEDQ  D:  07/08/2003  T:  07/09/2003  Job:  [id number]    cc:   Colleen Can. Deborah Chalk, M.D.  Fax: [phone]

## 2010-09-30 NOTE — Discharge Summary (Signed)
NAME:  Jachim, [place] H                        ACCOUNT NO.:  [id number]   MEDICAL RECORD NO.:  [id number]                   PATIENT TYPE:  INP   LOCATION:  5742                                 FACILITY:  [facility]   PHYSICIAN:  Gabrielle Dare. Janee Morn, M.D.             DATE OF BIRTH:  05/01/1927   DATE OF ADMISSION:  07/13/2003  DATE OF DISCHARGE:  07/16/2003                                 DISCHARGE SUMMARY   DISCHARGE DIAGNOSES:  Partial small-bowel obstruction, resolved.   HISTORY OF PRESENT ILLNESS:  The patient is a 77 year old white male who  underwent repair of an incarcerated right lower quadrant ventral hernia on  July 09, 2003. A small bowel loop had been trimmed with hernia. That was  reduced back into his abdomen and his hernia was repaired at that time.  Postoperatively he did well with return of bowel function. He developed some  recurrent nausea and vomiting at home and he was readmitted back to the  hospital with a partial small-bowel obstruction.   HOSPITAL COURSE:  The patient was admitted, given IV fluids, and NG tube to  bulb suction was inserted. Dr. Deborah Chalk, the patient's cardiologist, saw him  in consultation for management of his hypertension and coronary artery  disease. The patient tolerated clamping and removal of his nasogastric tube  the day after admission. He began having several loose bowel movements. He  was started on a clear liquid diet shortly thereafter and he remained  afebrile and hemodynamically stable. Diet was advanced to regular, which he  tolerated nicely. He was discharged home on July 16, 2003 in stable  condition.   DIET:  Regular.   DISCHARGE ACTIVITIES:  No lifting as per his postoperative instructions.   DISCHARGE MEDICATIONS:  He is to resume his previous at home medications,  which include:  1. Toprol XL 25 mg p.o. daily.  2. Allopurinol 100 mg p.o. daily.  3. Some pain medication.   DISCHARGE FOLLOW UP:  Follow-up in two  weeks.                                                Gabrielle Dare Janee Morn, M.D.    BET/MEDQ  D:  08/03/2003  T:  08/05/2003  Job:  [id number]

## 2010-09-30 NOTE — Op Note (Signed)
NAME:  Patrick [name], [place] H                        ACCOUNT NO.:  [id number]   MEDICAL RECORD NO.:  [id number]                   PATIENT TYPE:  INP   LOCATION:  3701                                 FACILITY:  [facility]   PHYSICIAN:  Gabrielle Dare. Janee Morn, M.D.             DATE OF BIRTH:  Feb 03, 1927   DATE OF PROCEDURE:  07/09/2003  DATE OF DISCHARGE:                                 OPERATIVE REPORT   PREOPERATIVE DIAGNOSIS:  Incarcerated incisional hernia with small bowel  obstruction.   POSTOPERATIVE DIAGNOSIS:  Incarcerated incisional hernia with small bowel  obstruction.   OPERATION PERFORMED:  Repair of incarcerated incisional hernia and release  of small bowel obstruction.   SURGEON:  Gabrielle Dare. Janee Morn, M.D.   ANESTHESIA:  General.   INDICATIONS FOR PROCEDURE:  The patient is a 77 year old white male who is  admitted to Dr. Matthias [name] from GI services with nausea and vomiting and  abdominal pain.  [name]  There was a loop of small bowel caught in the hernia  with an associated small bowel obstruction.  The patient is brought to the  operating room for repair of this hernia and release of his small bowel  obstruction.   DESCRIPTION OF PROCEDURE:  Informed consent was obtained.  Patient received  intravenous antibiotics and was brought to the operating room.  General  anesthesia was administered.  His abdomen was prepped and draped in sterile  fashion.  An incision was made along his previous scar at the right lower  quadrant.  Subcutaneous tissues were dissected down.  Meticulous dissection  ensued, circumferentially around the hernia mass which was incarcerated.  This was gradually brought down to the fascial edge which was  circumferentially dissected around the hernia which consisted of a little  bit of some omentum which was excised and  the loop of small bowel which was  caught.  Once this was carefully dissected and spontaneously reduced back  into the abdomen, it was easily brought back down. It was peristalsing  nicely.  It was completely pink and viable.  This was reduced down into the  abdomen and the fascial opening was cleared off on the inside  circumferentially in order to facilitate the repair.  Once there were at  least 2 cm back of fascia circumferentially around the fascial defect which  was about 2 cm in size across, the bowel was reinspected one more time and  appeared in great condition and the hernia repair was completed with a  series of interrupted 0 Prolene sutures.  These were all placed and then  tagged with hemostats in order to not trap any intra-abdominal contents with  each suture and once they were all placed, they were carefully tied again  with care not to trap any  intra-abdominal contents and this was  accomplished.  The area was copiously irrigated.  0.25% Marcaine was used in  the subcutaneous tissue and skin for postoperative pain relief.  Subcutaneous tissues were approximated with a series of interrupted 3-0  Vicryl sutures.  The area was again irrigated and the skin was closed with a  running 4-0 Monocryl subcuticular stitch.  Sponge, needle and instrument  counts were correct.  The patient tolerated the procedure well without  complications and was taken to recovery room in stable condition.                                               Gabrielle Dare Janee Morn, M.D.    BET/MEDQ  D:  07/09/2003  T:  07/10/2003  Job:  [id number]

## 2010-10-25 ENCOUNTER — Encounter: Payer: Self-pay | Admitting: Cardiology

## 2010-10-28 ENCOUNTER — Encounter: Payer: Self-pay | Admitting: Cardiology

## 2010-10-28 ENCOUNTER — Ambulatory Visit (INDEPENDENT_AMBULATORY_CARE_PROVIDER_SITE_OTHER): Payer: Medicare Other | Admitting: Cardiology

## 2010-10-28 VITALS — BP 118/86 | HR 76 | Ht 72.0 in | Wt 187.0 lb

## 2010-10-28 DIAGNOSIS — E785 Hyperlipidemia, unspecified: Secondary | ICD-10-CM

## 2010-10-28 DIAGNOSIS — E039 Hypothyroidism, unspecified: Secondary | ICD-10-CM | POA: Insufficient documentation

## 2010-10-28 DIAGNOSIS — I2510 Atherosclerotic heart disease of native coronary artery without angina pectoris: Secondary | ICD-10-CM

## 2010-10-28 LAB — BASIC METABOLIC PANEL
CO2: 24 mEq/L (ref 19–32)
Calcium: 8.9 mg/dL (ref 8.4–10.5)
Creatinine, Ser: 1.3 mg/dL (ref 0.4–1.5)
Glucose, Bld: 102 mg/dL — ABNORMAL HIGH (ref 70–99)

## 2010-10-28 LAB — HEPATIC FUNCTION PANEL
AST: 28 U/L (ref 0–37)
Alkaline Phosphatase: 53 U/L (ref 39–117)
Bilirubin, Direct: 0.1 mg/dL (ref 0.0–0.3)
Total Bilirubin: 0.8 mg/dL (ref 0.3–1.2)

## 2010-10-28 LAB — LIPID PANEL
Total CHOL/HDL Ratio: 3
Triglycerides: 78 mg/dL (ref 0.0–149.0)

## 2010-10-28 NOTE — Assessment & Plan Note (Signed)
We'll get lab work

## 2010-10-28 NOTE — Progress Notes (Signed)
Subjective:   Patrick [name] comes in today for followup visit. Overall, from a cardiac standpoint Patrick [name] is doing well. Patrick [name] had catheterization last in April 2000 at 12 Patrick [name] had an occluded saphenous vein graft to posterior descending that filled by collaterals and the remainder of his revascularization with satisfactory. Patrick [name] had normal ejection fraction. Patrick [name] is continued to do well since that time.  Patrick [name] walks on a daily basis. Patrick [name] does complain of some fatigue in the early afternoon but overall, Patrick [name] continues to do well. Patrick [name] has not had recurrent Gout. Patrick [name] has a history of hyperlipemia as well as hypothyroidism. Current Outpatient Prescriptions  Medication Sig Dispense Refill  . ALLOPURINOL PO Take by mouth daily.        Marland Kitchen aspirin 81 MG tablet Take 81 mg by mouth daily.        Marland Kitchen CALCIUM PO Take by mouth daily.        . clopidogrel (PLAVIX) 75 MG tablet Take 75 mg by mouth daily.        . Famotidine (PEPCID PO) Take by mouth daily.        Marland Kitchen levothyroxine (SYNTHROID, LEVOTHROID) 75 MCG tablet Take 75 mcg by mouth daily.        . nebivolol (BYSTOLIC) 5 MG tablet Take 5 mg by mouth daily.        . nitroGLYCERIN (NITROSTAT) 0.4 MG SL tablet Place 0.4 mg under the tongue every 5 (five) minutes as needed.        . rosuvastatin (CRESTOR) 10 MG tablet Take 10 mg by mouth daily.        Marland Kitchen alfuzosin (UROXATRAL) 10 MG 24 hr tablet Take 1 tablet by mouth Daily.      . metoprolol succinate (TOPROL-XL) 25 MG 24 hr tablet Take 1 tablet by mouth Daily.      Marland Kitchen DISCONTD: ALPRAZolam (XANAX PO) Take by mouth daily.        Marland Kitchen DISCONTD: MECLIZINE HCL PO Take by mouth as needed.        Marland Kitchen DISCONTD: omeprazole (PRILOSEC OTC) 20 MG tablet Take 20 mg by mouth daily.        Marland Kitchen DISCONTD: Solifenacin Succinate (VESICARE PO) Take by mouth daily.          Allergies  Allergen Reactions  . Pravachol     There is no problem list on file for this patient.   History  Smoking status  . Former Smoker -- 1.0 packs/day for 15 years  . Types:  Cigarettes  . Quit date: 05/15/1958  Smokeless tobacco  . Not on file    History  Alcohol Use No    No family history on file.  Review of Systems:   The patient denies any heat or cold intolerance.  No weight gain or weight loss.  The patient denies headaches or blurry vision.  There is no cough or sputum production.  The patient denies dizziness.  There is no hematuria or hematochezia.  The patient denies any muscle aches or arthritis.  The patient denies any rash.  The patient denies frequent falling or instability.  There is no history of depression or anxiety.  All other systems were reviewed and are negative.   Physical Exam:   Weight is 187. Blood pressure 118/86. Heart rate 76.The head is normocephalic and atraumatic.  Pupils are equally round and reactive to light.  Sclerae nonicteric.  Conjunctiva is clear.  Oropharynx is unremarkable.  There's adequate oral airway.  Neck is supple there  are no masses.  Thyroid is not enlarged.  There is no lymphadenopathy.  Lungs are clear.  Chest is symmetric.  Heart shows a regular rate and rhythm.  S1 and S2 are normal.  There is no murmur click or gallop.  Abdomen is soft normal bowel sounds.  There is no organomegaly.  Genital and rectal deferred.  Extremities are without edema.  Peripheral pulses are adequate.  Neurologically intact.  Full range of motion.  The patient is not depressed.  Skin is warm and dry.  Assessment / Plan:

## 2010-10-28 NOTE — Assessment & Plan Note (Signed)
We'll continue current plan. I will have him see Dr. Elease Hashimoto and Lawson Fiscal in followup.  He will see Lawson Fiscal in 6 months.

## 2010-11-01 ENCOUNTER — Telehealth: Payer: Self-pay | Admitting: *Deleted

## 2010-11-01 NOTE — Telephone Encounter (Signed)
Pt notified of labs and will f/u with Norma Fredrickson NP in six months.

## 2011-01-23 ENCOUNTER — Other Ambulatory Visit: Payer: Self-pay | Admitting: *Deleted

## 2011-01-24 ENCOUNTER — Telehealth: Payer: Self-pay | Admitting: *Deleted

## 2011-01-24 NOTE — Telephone Encounter (Signed)
Pt to call back for medication update, is he taking both bystolic and metoprolol. Received fax from [facility] for clarification, chart on jodette's desk.

## 2011-01-25 ENCOUNTER — Telehealth: Payer: Self-pay | Admitting: Cardiovascular Disease

## 2011-01-25 NOTE — Telephone Encounter (Signed)
echart states he is on bystolic and metoprolol. Pt called and clarified dr Deborah Chalk stopped metoprolol and started him on bystolic. Record changed and [facility] was updated.Alfonso Ramus RN

## 2011-01-25 NOTE — Telephone Encounter (Signed)
Pt rtn call 

## 2011-02-14 ENCOUNTER — Telehealth: Payer: Self-pay | Admitting: *Deleted

## 2011-02-14 DIAGNOSIS — E785 Hyperlipidemia, unspecified: Secondary | ICD-10-CM

## 2011-02-14 NOTE — Telephone Encounter (Signed)
Pt called and informed of app and labs,

## 2011-02-24 ENCOUNTER — Other Ambulatory Visit: Payer: Self-pay | Admitting: Nurse Practitioner

## 2011-03-20 ENCOUNTER — Ambulatory Visit (INDEPENDENT_AMBULATORY_CARE_PROVIDER_SITE_OTHER): Payer: Medicare Other | Admitting: Nurse Practitioner

## 2011-03-20 ENCOUNTER — Encounter: Payer: Self-pay | Admitting: Nurse Practitioner

## 2011-03-20 ENCOUNTER — Other Ambulatory Visit (INDEPENDENT_AMBULATORY_CARE_PROVIDER_SITE_OTHER): Payer: Medicare Other | Admitting: *Deleted

## 2011-03-20 VITALS — BP 132/72 | HR 64 | Ht 72.0 in | Wt 179.1 lb

## 2011-03-20 DIAGNOSIS — I2510 Atherosclerotic heart disease of native coronary artery without angina pectoris: Secondary | ICD-10-CM

## 2011-03-20 DIAGNOSIS — E785 Hyperlipidemia, unspecified: Secondary | ICD-10-CM

## 2011-03-20 DIAGNOSIS — Z8619 Personal history of other infectious and parasitic diseases: Secondary | ICD-10-CM

## 2011-03-20 LAB — BASIC METABOLIC PANEL
BUN: 19 mg/dL (ref 6–23)
CO2: 22 mEq/L (ref 19–32)
Calcium: 8.8 mg/dL (ref 8.4–10.5)
Creatinine, Ser: 1.2 mg/dL (ref 0.4–1.5)
Glucose, Bld: 100 mg/dL — ABNORMAL HIGH (ref 70–99)

## 2011-03-20 LAB — LIPID PANEL
HDL: 45.9 mg/dL (ref >39.00)
Triglycerides: 87 mg/dL (ref 0.0–149.0)

## 2011-03-20 LAB — HEPATIC FUNCTION PANEL
Albumin: 4.4 g/dL (ref 3.5–5.2)
Total Protein: 7 g/dL (ref 6.0–8.3)

## 2011-03-20 MED ORDER — NITROGLYCERIN 0.4 MG SL SUBL: 0.4000 mg | SUBLINGUAL_TABLET | SUBLINGUAL | Status: DC | PRN

## 2011-03-20 NOTE — Assessment & Plan Note (Signed)
Checking labs today

## 2011-03-20 NOTE — Progress Notes (Signed)
Patrick [name] H Kea Date of Birth: 07/02/1926 Medical Record #[id number]  History of Present Illness: Patrick [name] is seen back today for his 6 month visit. He is seen for Dr. Elease Hashimoto. He is a former patient of Dr. Ronnald Nian. He has known ischemic heart disease. Last cath in 2010. He has an occluded SVG to the PD that fills by collaterals and the remainder of his revascularization is satisfactory. He is managed medically.   He is getting ready to leave for [place]. He is doing well. No chest pain. No shortness of breath. Still walking for an hour each day. His only complaint is that of fatigue. He could be taking both Bystolic and Metoprolol. He is not sure. Still has some issues with ED but is working with [facility]. The ED meds give him a headache. He has seen Dr. Clelia Croft for primary medical care.   Current Outpatient Prescriptions on File Prior to Visit  Medication Sig Dispense Refill  . alfuzosin (UROXATRAL) 10 MG 24 hr tablet Take 1 tablet by mouth Daily.      Marland Kitchen allopurinol (ZYLOPRIM) 300 MG tablet TAKE 1 TABLET DAILY  90 tablet  2  . aspirin 81 MG tablet Take 81 mg by mouth daily.        . clopidogrel (PLAVIX) 75 MG tablet Take 75 mg by mouth daily.        . CRESTOR 10 MG tablet TAKE 1 TABLET DAILY  90 tablet  2  . Famotidine (PEPCID PO) Take by mouth daily.        . nebivolol (BYSTOLIC) 5 MG tablet Take 5 mg by mouth daily.        . nitroGLYCERIN (NITROSTAT) 0.4 MG SL tablet Place 0.4 mg under the tongue every 5 (five) minutes as needed.        Marland Kitchen levothyroxine (SYNTHROID, LEVOTHROID) 75 MCG tablet Take 75 mcg by mouth daily.        Marland Kitchen DISCONTD: ALLOPURINOL PO Take by mouth daily.          Allergies  Allergen Reactions  . Halcion (Triazolam)   . Pravachol     Past Medical History  Diagnosis Date  . Hyperlipidemia   . GERD (gastroesophageal reflux disease)   . ED (erectile dysfunction)   . BPH (benign prostatic hypertrophy)   . Gout   . Peptic ulcer disease   . MI, acute, non ST segment  elevation 2010    s/p cath with occluded SVG to PD that fills by collaterals and the remainder of his revasculsarization is satisfactory.   . IHD (ischemic heart disease)     Remote CABG in 1997  . Hypothyroidism   . Hypertension     Past Surgical History  Procedure Date  . Cardiac catheterization 09/08/2008    NORMAL. EF 60%; Occluded SVG to PD that fills by collaterals and the remainder of his revascularization is satisfactory. he is managed medically.   . Coronary artery bypass graft 1997    CABG x 8  . Appendectomy     History  Smoking status  . Former Smoker -- 1.0 packs/day for 15 years  . Types: Cigarettes  . Quit date: 05/15/1958  Smokeless tobacco  . Not on file    History  Alcohol Use No    Family History  Problem Relation Age of Onset  . Heart failure Mother 4    Review of Systems: The review of systems is per the HPI.  Has some fatigue. No gout. Some intermittent numbness  in his right thigh, but he says he is not bothered by it and does not want evaluation. Does have progressive BPH symptoms. All other systems were reviewed and are negative.  Physical Exam: BP 132/72  Pulse 64  Ht 6' (1.829 m)  Wt 179 lb 1.9 oz (81.248 kg)  BMI 24.29 kg/m2 Patient is very pleasant and in no acute distress. Skin is warm and dry. Color is normal.  HEENT is unremarkable. Normocephalic/atraumatic. PERRL. Sclera are nonicteric. Neck is supple. No masses. No JVD. Lungs are clear. Cardiac exam shows a regular rate and rhythm. Abdomen is soft. Extremities are without edema. Gait and ROM are intact. No gross neurologic deficits noted.   LABORATORY DATA: PENDING   Assessment / Plan:

## 2011-03-20 NOTE — Assessment & Plan Note (Signed)
He is doing well from our standpoint. We will see him back in 6 months. I did ask him to look at his medicine bottles. I do suspect that he is on both Metoprolol and Bystolic. If so, he will stop the Metoprolol. We will be checking labs today. He is to stay active. Patient is agreeable to this plan and will call if any problems develop in the interim.

## 2011-03-20 NOTE — Patient Instructions (Addendum)
I think you are doing well.  Stay on your current medicines. Make sure you are not taking the metoprolol. If you are, then stop.   We are going to check your labs today.   We will see you back in 6 months. You will see Dr. Delane Ginger.

## 2011-03-21 MED ORDER — ACYCLOVIR 5 % EX OINT: TOPICAL_OINTMENT | CUTANEOUS | Status: AC | PRN

## 2011-03-21 NOTE — Progress Notes (Signed)
Addended by: Rosalio Macadamia on: 03/21/2011 01:11 PM   Modules accepted: Orders

## 2011-03-22 DIAGNOSIS — Z8619 Personal history of other infectious and parasitic diseases: Secondary | ICD-10-CM | POA: Insufficient documentation

## 2011-03-22 NOTE — Assessment & Plan Note (Signed)
Has had some recurrent cold sores. Has used Acyclovir ointment in the past. I have refilled.

## 2011-04-11 ENCOUNTER — Other Ambulatory Visit: Payer: Self-pay | Admitting: Nurse Practitioner

## 2011-07-06 ENCOUNTER — Other Ambulatory Visit: Payer: Self-pay | Admitting: *Deleted

## 2011-07-06 MED ORDER — NEBIVOLOL HCL 5 MG PO TABS: 5.0000 mg | ORAL_TABLET | Freq: Every day | ORAL | Status: DC

## 2011-07-18 DIAGNOSIS — C4441 Basal cell carcinoma of skin of scalp and neck: Secondary | ICD-10-CM | POA: Diagnosis not present

## 2011-07-18 DIAGNOSIS — L570 Actinic keratosis: Secondary | ICD-10-CM | POA: Diagnosis not present

## 2011-08-08 DIAGNOSIS — C4441 Basal cell carcinoma of skin of scalp and neck: Secondary | ICD-10-CM | POA: Diagnosis not present

## 2011-08-22 DIAGNOSIS — I10 Essential (primary) hypertension: Secondary | ICD-10-CM | POA: Diagnosis not present

## 2011-08-22 DIAGNOSIS — Z0000 Encounter for general adult medical examination without abnormal findings: Secondary | ICD-10-CM | POA: Diagnosis not present

## 2011-08-22 DIAGNOSIS — Z125 Encounter for screening for malignant neoplasm of prostate: Secondary | ICD-10-CM | POA: Diagnosis not present

## 2011-08-22 DIAGNOSIS — M109 Gout, unspecified: Secondary | ICD-10-CM | POA: Diagnosis not present

## 2011-08-22 DIAGNOSIS — G2581 Restless legs syndrome: Secondary | ICD-10-CM | POA: Diagnosis not present

## 2011-08-22 DIAGNOSIS — E039 Hypothyroidism, unspecified: Secondary | ICD-10-CM | POA: Diagnosis not present

## 2011-08-22 DIAGNOSIS — E785 Hyperlipidemia, unspecified: Secondary | ICD-10-CM | POA: Diagnosis not present

## 2011-08-29 ENCOUNTER — Ambulatory Visit (INDEPENDENT_AMBULATORY_CARE_PROVIDER_SITE_OTHER): Payer: Medicare Other | Admitting: Cardiovascular Disease

## 2011-08-29 ENCOUNTER — Encounter: Payer: Self-pay | Admitting: Cardiovascular Disease

## 2011-08-29 VITALS — BP 142/72 | HR 65 | Ht 72.0 in | Wt 179.1 lb

## 2011-08-29 DIAGNOSIS — Z23 Encounter for immunization: Secondary | ICD-10-CM | POA: Diagnosis not present

## 2011-08-29 DIAGNOSIS — R059 Cough, unspecified: Secondary | ICD-10-CM | POA: Diagnosis not present

## 2011-08-29 DIAGNOSIS — R7301 Impaired fasting glucose: Secondary | ICD-10-CM | POA: Diagnosis not present

## 2011-08-29 DIAGNOSIS — R05 Cough: Secondary | ICD-10-CM | POA: Diagnosis not present

## 2011-08-29 DIAGNOSIS — Z0000 Encounter for general adult medical examination without abnormal findings: Secondary | ICD-10-CM | POA: Diagnosis not present

## 2011-08-29 DIAGNOSIS — I2510 Atherosclerotic heart disease of native coronary artery without angina pectoris: Secondary | ICD-10-CM | POA: Diagnosis not present

## 2011-08-29 DIAGNOSIS — E785 Hyperlipidemia, unspecified: Secondary | ICD-10-CM

## 2011-08-29 DIAGNOSIS — E039 Hypothyroidism, unspecified: Secondary | ICD-10-CM | POA: Diagnosis not present

## 2011-08-29 DIAGNOSIS — I10 Essential (primary) hypertension: Secondary | ICD-10-CM | POA: Diagnosis not present

## 2011-08-29 DIAGNOSIS — Z1212 Encounter for screening for malignant neoplasm of rectum: Secondary | ICD-10-CM | POA: Diagnosis not present

## 2011-08-29 NOTE — Assessment & Plan Note (Signed)
[place] was doing well. He's not had any episodes of angina. He complains of fatigue but his symptoms are not consistent with ischemia.  Continue current meds.

## 2011-08-29 NOTE — Progress Notes (Signed)
Patrick [name] H Sass Date of Birth  10-05-1926 [facility] Office  [address]    [address]   [address] [place], [place]  [address]    [place], [place]  [address] [phone]  Fax  [phone]  [phone]  Fax [phone]  Problems: 1. Coronary artery disease- status post CABG 1997.  2. Hyperlipidemia 3. Hypothyroidism   History of Present Illness:  Patrick [name] is an 78 yo with the above noted hx.  He complains of fatigue.  Current Outpatient Prescriptions on File Prior to Visit  Medication Sig Dispense Refill  . acyclovir (ZOVIRAX) 5 % ointment Apply topically as needed.  30 g  0  . alfuzosin (UROXATRAL) 10 MG 24 hr tablet TAKE 1 TABLET DAILY  90 tablet  2  . allopurinol (ZYLOPRIM) 300 MG tablet TAKE 1 TABLET DAILY  90 tablet  2  . aspirin 81 MG tablet Take 81 mg by mouth daily.        . clopidogrel (PLAVIX) 75 MG tablet TAKE 1 TABLET DAILY  90 tablet  2  . CRESTOR 10 MG tablet TAKE 1 TABLET DAILY  90 tablet  2  . famotidine (PEPCID) 20 MG tablet TAKE 1 TABLET DAILY AT BEDTIME  90 tablet  2  . levothyroxine (SYNTHROID, LEVOTHROID) 100 MCG tablet TAKE 1 TABLET DAILY  90 tablet  2  . nebivolol (BYSTOLIC) 5 MG tablet Take 1 tablet (5 mg total) by mouth daily.  90 tablet  2  . nitroGLYCERIN (NITROSTAT) 0.4 MG SL tablet Place 1 tablet (0.4 mg total) under the tongue every 5 (five) minutes as needed.  25 tablet  6    Allergies  Allergen Reactions  . Halcion (Triazolam)   . Pravachol     Past Medical History  Diagnosis Date  . Hyperlipidemia   . GERD (gastroesophageal reflux disease)   . ED (erectile dysfunction)   . BPH (benign prostatic hypertrophy)   . Gout   . Peptic ulcer disease   . MI, acute, non ST segment elevation 2010    s/p cath with occluded SVG to PD that fills by collaterals and the remainder of his revasculsarization is satisfactory.   . IHD (ischemic heart disease)     Remote CABG in 1997  . Hypothyroidism   .  Hypertension     Past Surgical History  Procedure Date  . Cardiac catheterization 09/08/2008    NORMAL. EF 60%; Occluded SVG to PD that fills by collaterals and the remainder of his revascularization is satisfactory. he is managed medically.   . Coronary artery bypass graft 1997    CABG x 8  . Appendectomy     History  Smoking status  . Former Smoker -- 1.0 packs/day for 15 years  . Types: Cigarettes  . Quit date: 05/15/1958  Smokeless tobacco  . Not on file    History  Alcohol Use No    Family History  Problem Relation Age of Onset  . Heart failure Mother 40    Reviw of Systems:  Reviewed in the HPI.  All other systems are negative.  Physical Exam: Blood pressure 142/72, pulse 65, height 6' (1.829 m), weight 179 lb 1.9 oz (81.248 kg). General: Well developed, well nourished, in no acute distress.  Head: Normocephalic, atraumatic, sclera non-icteric, mucus membranes are moist,   Neck: Supple. Carotids are 2 + without bruits. No JVD  Lungs: Clear bilaterally to auscultation.  Heart: regular rate.  normal  S1 S2. No murmurs, gallops or rubs.  Abdomen: Soft, non-tender, non-distended with normal bowel sounds. No hepatomegaly. No rebound/guarding. No masses.  Msk:  Strength and tone are normal  Extremities: No clubbing or cyanosis. No edema.  Distal pedal pulses are 2+ and equal bilaterally.  Neuro: Alert and oriented X 3. Moves all extremities spontaneously.  Psych:  Responds to questions appropriately with a normal affect.  ECG: NSR.  Old Inferior MI.    Assessment / Plan:

## 2011-08-29 NOTE — Assessment & Plan Note (Signed)
Will check fasting lipids when I see him again in 6 months.

## 2011-08-29 NOTE — Patient Instructions (Signed)
Your physician wants you to follow-up in: 6 months You will receive a reminder letter in the mail two months in advance. If you don't receive a letter, please call our office to schedule the follow-up appointment.  Your physician recommends that you return for a FASTING lipid profile: your next visit or bring a copy of cholesterol results with you if they were done else where.

## 2011-08-30 ENCOUNTER — Encounter: Payer: Self-pay | Admitting: Cardiovascular Disease

## 2011-09-06 ENCOUNTER — Ambulatory Visit (INDEPENDENT_AMBULATORY_CARE_PROVIDER_SITE_OTHER): Payer: Medicare Other | Admitting: *Deleted

## 2011-09-06 DIAGNOSIS — I10 Essential (primary) hypertension: Secondary | ICD-10-CM

## 2011-09-06 DIAGNOSIS — M899 Disorder of bone, unspecified: Secondary | ICD-10-CM | POA: Diagnosis not present

## 2011-09-06 DIAGNOSIS — M949 Disorder of cartilage, unspecified: Secondary | ICD-10-CM | POA: Diagnosis not present

## 2011-09-12 NOTE — Procedures (Unsigned)
DUPLEX ULTRASOUND OF ABDOMINAL AORTA  INDICATION:  AAA screening.  HISTORY: Diabetes:  No. Cardiac:  CABG. Hypertension:  Yes. Smoking:  Former smoker. Connective Tissue Disorder: Family History:  None. Previous Surgery:  None.  DUPLEX EXAM:         AP (cm)                   TRANSVERSE (cm) Proximal             1.65 cm                   1.69 cm Mid                  1.68 cm                   1.66 cm Distal               1.38 cm                   1.41 cm Right Iliac          1.13 cm                   1.29 cm Left Iliac           1.01 cm                   1.04 cm  PREVIOUS:  Date:  AP:  TRANSVERSE:  IMPRESSION:  No evidence of abdominal aortic aneurysm.  ___________________________________________ Quita Skye Hart [place], M.D.  EM/MEDQ  D:  09/06/2011  T:  09/06/2011  Job:  [id number]

## 2011-09-20 ENCOUNTER — Other Ambulatory Visit: Payer: Self-pay | Admitting: Gastroenterology

## 2011-09-20 DIAGNOSIS — R1310 Dysphagia, unspecified: Secondary | ICD-10-CM | POA: Diagnosis not present

## 2011-09-20 DIAGNOSIS — K219 Gastro-esophageal reflux disease without esophagitis: Secondary | ICD-10-CM | POA: Diagnosis not present

## 2011-09-22 ENCOUNTER — Ambulatory Visit
Admission: RE | Admit: 2011-09-22 | Discharge: 2011-09-22 | Disposition: A | Discharge status: Home / Self Care | Payer: Medicare Other | Source: Ambulatory Visit | Attending: Gastroenterology | Admitting: Gastroenterology

## 2011-09-22 DIAGNOSIS — K219 Gastro-esophageal reflux disease without esophagitis: Secondary | ICD-10-CM | POA: Diagnosis not present

## 2011-09-22 DIAGNOSIS — R1310 Dysphagia, unspecified: Secondary | ICD-10-CM | POA: Diagnosis not present

## 2011-09-22 DIAGNOSIS — K449 Diaphragmatic hernia without obstruction or gangrene: Secondary | ICD-10-CM | POA: Diagnosis not present

## 2011-10-18 DIAGNOSIS — L570 Actinic keratosis: Secondary | ICD-10-CM | POA: Diagnosis not present

## 2011-11-29 ENCOUNTER — Other Ambulatory Visit: Payer: Self-pay | Admitting: *Deleted

## 2011-11-29 MED ORDER — ROSUVASTATIN CALCIUM 10 MG PO TABS: 10.0000 mg | ORAL_TABLET | Freq: Every day | ORAL | Status: DC

## 2011-11-29 MED ORDER — ALLOPURINOL 300 MG PO TABS: 300.0000 mg | ORAL_TABLET | Freq: Every day | ORAL | Status: DC

## 2011-11-29 NOTE — Telephone Encounter (Signed)
Refilled allopurinol and crestor

## 2011-12-19 ENCOUNTER — Other Ambulatory Visit: Payer: Self-pay | Admitting: Cardiovascular Disease

## 2011-12-19 MED ORDER — LEVOTHYROXINE SODIUM 100 MCG PO TABS: 100.0000 ug | ORAL_TABLET | Freq: Every day | ORAL | Status: DC

## 2011-12-28 ENCOUNTER — Other Ambulatory Visit: Payer: Self-pay | Admitting: *Deleted

## 2011-12-28 MED ORDER — NEBIVOLOL HCL 5 MG PO TABS: 5.0000 mg | ORAL_TABLET | Freq: Every day | ORAL | Status: DC

## 2011-12-28 NOTE — Telephone Encounter (Signed)
Fax Received. Refill Completed. Jazzmin Newbold Chowoe (R.M.A)   

## 2012-01-03 DIAGNOSIS — L570 Actinic keratosis: Secondary | ICD-10-CM | POA: Diagnosis not present

## 2012-01-16 DIAGNOSIS — H518 Other specified disorders of binocular movement: Secondary | ICD-10-CM | POA: Diagnosis not present

## 2012-01-25 DIAGNOSIS — Z23 Encounter for immunization: Secondary | ICD-10-CM | POA: Diagnosis not present

## 2012-02-26 ENCOUNTER — Ambulatory Visit (INDEPENDENT_AMBULATORY_CARE_PROVIDER_SITE_OTHER): Payer: Medicare Other | Admitting: Cardiovascular Disease

## 2012-02-26 ENCOUNTER — Encounter: Payer: Self-pay | Admitting: Cardiovascular Disease

## 2012-02-26 VITALS — BP 130/74 | HR 67 | Ht 72.0 in | Wt 186.4 lb

## 2012-02-26 DIAGNOSIS — I2510 Atherosclerotic heart disease of native coronary artery without angina pectoris: Secondary | ICD-10-CM | POA: Diagnosis not present

## 2012-02-26 NOTE — Patient Instructions (Addendum)
Your physician wants you to follow-up in: 6 months  You will receive a reminder letter in the mail two months in advance. If you don't receive a letter, please call our office to schedule the follow-up appointment.  Your physician recommends that you return for a FASTING lipid profile: 6 months   

## 2012-02-26 NOTE — Progress Notes (Signed)
Patrick [name] H Pruss Date of Birth  29-Nov-1926 [facility] Office  [address]    [address]   [address] [place], [place]  [address]    [place], [place]  [address] [phone]  Fax  [phone]  [phone]  Fax [phone]  Problems: 1. Coronary artery disease- status post CABG 1997.  2. Hyperlipidemia 3. Hypothyroidism   History of Present Illness:  Patrick [name] is an 78 yo with the above noted hx.  He exercises every day ( walks for an hour a day).  He complains of fatigue when doing yard work.  He's not had any cardiac complaints.  Current Outpatient Prescriptions on File Prior to Visit  Medication Sig Dispense Refill  . acyclovir (ZOVIRAX) 5 % ointment Apply topically as needed.  30 g  0  . alfuzosin (UROXATRAL) 10 MG 24 hr tablet TAKE 1 TABLET DAILY  90 tablet  2  . allopurinol (ZYLOPRIM) 300 MG tablet Take 1 tablet (300 mg total) by mouth daily.  90 tablet  3  . aspirin 81 MG tablet Take 81 mg by mouth daily.        Marland Kitchen levothyroxine (SYNTHROID, LEVOTHROID) 100 MCG tablet Take 1 tablet (100 mcg total) by mouth daily.  90 tablet  1  . nebivolol (BYSTOLIC) 5 MG tablet Take 1 tablet (5 mg total) by mouth daily.  90 tablet  3  . nitroGLYCERIN (NITROSTAT) 0.4 MG SL tablet Place 1 tablet (0.4 mg total) under the tongue every 5 (five) minutes as needed.  25 tablet  6  . rosuvastatin (CRESTOR) 10 MG tablet Take 1 tablet (10 mg total) by mouth daily.  90 tablet  3    Allergies  Allergen Reactions  . Halcion (Triazolam)   . Pravachol     Past Medical History  Diagnosis Date  . Hyperlipidemia   . GERD (gastroesophageal reflux disease)   . ED (erectile dysfunction)   . BPH (benign prostatic hypertrophy)   . Gout   . Peptic ulcer disease   . MI, acute, non ST segment elevation 2010    s/p cath with occluded SVG to PD that fills by collaterals and the remainder of his revasculsarization is satisfactory.   . IHD (ischemic heart disease)     Remote  CABG in 1997  . Hypothyroidism   . Hypertension     Past Surgical History  Procedure Date  . Cardiac catheterization 09/08/2008    NORMAL. EF 60%; Occluded SVG to PD that fills by collaterals and the remainder of his revascularization is satisfactory. he is managed medically.   . Coronary artery bypass graft 1997    CABG x 8  . Appendectomy     History  Smoking status  . Former Smoker -- 1.0 packs/day for 15 years  . Types: Cigarettes  . Quit date: 05/15/1958  Smokeless tobacco  . Not on file    History  Alcohol Use No    Family History  Problem Relation Age of Onset  . Heart failure Mother 66    Reviw of Systems:  Reviewed in the HPI.  All other systems are negative.  Physical Exam: Blood pressure 130/74, pulse 67, height 6' (1.829 m), weight 186 lb 6.4 oz (84.55 kg). General: Well developed, well nourished, in no acute distress.  Head: Normocephalic, atraumatic, sclera non-icteric, mucus membranes are moist,   Neck: Supple. Carotids are 2 + without bruits. No JVD  Lungs: Clear bilaterally to auscultation.  Heart:  regular rate.  normal  S1 S2. No murmurs, gallops or rubs.  Abdomen: Soft, non-tender, non-distended with normal bowel sounds. No hepatomegaly. No rebound/guarding. No masses.  Msk:  Strength and tone are normal  Extremities: No clubbing or cyanosis. No edema.  Distal pedal pulses are 2+ and equal bilaterally.  Neuro: Alert and oriented X 3. Moves all extremities spontaneously.  Psych:  Responds to questions appropriately with a normal affect.  ECG:   Assessment / Plan:

## 2012-02-26 NOTE — Assessment & Plan Note (Signed)
Patrick [name] is doing very well. He's not had any episodes of chest pain or shortness of breath.  He has asked if he would be able to use viagra or a similar agent. I think that he is at relatively low risk and should be able to take any of these medications daily has not had to take any nitroglycerin.  I see him again in 6 months for an office visit and fasting lab work.

## 2012-02-28 DIAGNOSIS — R7301 Impaired fasting glucose: Secondary | ICD-10-CM | POA: Diagnosis not present

## 2012-02-28 DIAGNOSIS — I10 Essential (primary) hypertension: Secondary | ICD-10-CM | POA: Diagnosis not present

## 2012-02-28 DIAGNOSIS — E039 Hypothyroidism, unspecified: Secondary | ICD-10-CM | POA: Diagnosis not present

## 2012-02-28 DIAGNOSIS — E785 Hyperlipidemia, unspecified: Secondary | ICD-10-CM | POA: Diagnosis not present

## 2012-02-29 ENCOUNTER — Other Ambulatory Visit: Payer: Self-pay | Admitting: *Deleted

## 2012-02-29 DIAGNOSIS — L570 Actinic keratosis: Secondary | ICD-10-CM | POA: Diagnosis not present

## 2012-02-29 MED ORDER — ALFUZOSIN HCL ER 10 MG PO TB24: 10.0000 mg | ORAL_TABLET | Freq: Every day | ORAL | Status: DC

## 2012-03-04 ENCOUNTER — Encounter: Payer: Self-pay | Admitting: Cardiology

## 2012-04-15 DIAGNOSIS — N401 Enlarged prostate with lower urinary tract symptoms: Secondary | ICD-10-CM | POA: Diagnosis not present

## 2012-04-15 DIAGNOSIS — N529 Male erectile dysfunction, unspecified: Secondary | ICD-10-CM | POA: Diagnosis not present

## 2012-04-30 DIAGNOSIS — L570 Actinic keratosis: Secondary | ICD-10-CM | POA: Diagnosis not present

## 2012-05-06 ENCOUNTER — Other Ambulatory Visit: Payer: Self-pay | Admitting: *Deleted

## 2012-05-06 MED ORDER — LEVOTHYROXINE SODIUM 100 MCG PO TABS: 100.0000 ug | ORAL_TABLET | Freq: Every day | ORAL | Status: DC

## 2012-05-06 NOTE — Telephone Encounter (Signed)
Fax Received. Refill Completed. Patrick [name] (R.M.A)   

## 2012-07-04 ENCOUNTER — Other Ambulatory Visit: Payer: Self-pay

## 2012-07-08 ENCOUNTER — Other Ambulatory Visit: Payer: Self-pay | Admitting: Nurse Practitioner

## 2012-07-08 ENCOUNTER — Telehealth: Payer: Self-pay | Admitting: Nurse Practitioner

## 2012-07-08 NOTE — Telephone Encounter (Signed)
Had some issue with his Pepcid with [employer] He denies GI issues - will stop and see how he does. If needs, may pick up OTC.

## 2012-08-27 ENCOUNTER — Encounter: Payer: Self-pay | Admitting: Cardiovascular Disease

## 2012-08-27 DIAGNOSIS — E785 Hyperlipidemia, unspecified: Secondary | ICD-10-CM | POA: Diagnosis not present

## 2012-08-27 DIAGNOSIS — R7301 Impaired fasting glucose: Secondary | ICD-10-CM | POA: Diagnosis not present

## 2012-08-27 DIAGNOSIS — I2510 Atherosclerotic heart disease of native coronary artery without angina pectoris: Secondary | ICD-10-CM | POA: Diagnosis not present

## 2012-08-27 DIAGNOSIS — E039 Hypothyroidism, unspecified: Secondary | ICD-10-CM | POA: Diagnosis not present

## 2012-08-27 DIAGNOSIS — I10 Essential (primary) hypertension: Secondary | ICD-10-CM | POA: Diagnosis not present

## 2012-08-27 DIAGNOSIS — M109 Gout, unspecified: Secondary | ICD-10-CM | POA: Diagnosis not present

## 2012-08-28 ENCOUNTER — Encounter: Payer: Self-pay | Admitting: Cardiovascular Disease

## 2012-08-28 DIAGNOSIS — I10 Essential (primary) hypertension: Secondary | ICD-10-CM | POA: Diagnosis not present

## 2012-08-30 ENCOUNTER — Encounter: Payer: Self-pay | Admitting: Cardiovascular Disease

## 2012-08-30 ENCOUNTER — Ambulatory Visit (INDEPENDENT_AMBULATORY_CARE_PROVIDER_SITE_OTHER): Payer: Medicare Other | Admitting: Cardiovascular Disease

## 2012-08-30 VITALS — BP 130/72 | HR 75 | Ht 72.0 in | Wt 188.0 lb

## 2012-08-30 DIAGNOSIS — E785 Hyperlipidemia, unspecified: Secondary | ICD-10-CM

## 2012-08-30 DIAGNOSIS — I2510 Atherosclerotic heart disease of native coronary artery without angina pectoris: Secondary | ICD-10-CM

## 2012-08-30 NOTE — Assessment & Plan Note (Signed)
Patrick Bible  is doing well.  He has not had any episodes of chest pain or shortness breath. He's having some fatigue but I don't think that this is related to coronary artery disease. It sounds like what he's gone fatigue is just the fact that he takes a nap frequently in the afternoon. He sent to do all of his normal activities without significant problems. He'll discuss this fatigue with his medical doctor.

## 2012-08-30 NOTE — Patient Instructions (Addendum)
Your physician recommends that you return for a FASTING lipid profile: 6 MONTHS   Your physician wants you to follow-up in: 6 MONTHS  You will receive a reminder letter in the mail two months in advance. If you don't receive a letter, please call our office to schedule the follow-up appointment.

## 2012-08-30 NOTE — Assessment & Plan Note (Signed)
Continue current meds 

## 2012-08-30 NOTE — Progress Notes (Signed)
Patrick [name] H Broadwell Date of Birth  Apr 29, 1927 [facility] Office  [address]    [address]   [address] [place], [place]  [address]    [place], [place]  [address] [phone]  Fax  [phone]  [phone]  Fax [phone]  Problems: 1. Coronary artery disease- status post CABG 1997.  2. Hyperlipidemia 3. Hypothyroidism   History of Present Illness:  Patrick [name] is an 79 yo with the above noted hx.  He exercises every day ( walks for an hour a day).  He complains of fatigue when doing yard work.  He's not had any cardiac complaints.  August 30, 2012:  He still complains of generalized fatigue.   He has been having fatigued for the past several office visits.  He is really frustrated with his fatigue.  Questions whether or not it is due to his medications.  Current Outpatient Prescriptions on File Prior to Visit  Medication Sig Dispense Refill  . alfuzosin (UROXATRAL) 10 MG 24 hr tablet Take 1 tablet (10 mg total) by mouth daily.  90 tablet  2  . allopurinol (ZYLOPRIM) 300 MG tablet Take 1 tablet (300 mg total) by mouth daily.  90 tablet  3  . aspirin 81 MG tablet Take 81 mg by mouth daily.        Marland Kitchen levothyroxine (SYNTHROID, LEVOTHROID) 100 MCG tablet Take 1 tablet (100 mcg total) by mouth daily.  90 tablet  3  . nebivolol (BYSTOLIC) 5 MG tablet Take 1 tablet (5 mg total) by mouth daily.  90 tablet  3  . nitroGLYCERIN (NITROSTAT) 0.4 MG SL tablet Place 1 tablet (0.4 mg total) under the tongue every 5 (five) minutes as needed.  25 tablet  6  . rosuvastatin (CRESTOR) 10 MG tablet Take 1 tablet (10 mg total) by mouth daily.  90 tablet  3   No current facility-administered medications on file prior to visit.    Allergies  Allergen Reactions  . Halcion (Triazolam)   . Pravachol     Past Medical History  Diagnosis Date  . Hyperlipidemia   . GERD (gastroesophageal reflux disease)   . ED (erectile dysfunction)   . BPH (benign prostatic  hypertrophy)   . Gout   . Peptic ulcer disease   . MI, acute, non ST segment elevation 2010    s/p cath with occluded SVG to PD that fills by collaterals and the remainder of his revasculsarization is satisfactory.   . IHD (ischemic heart disease)     Remote CABG in 1997  . Hypothyroidism   . Hypertension     Past Surgical History  Procedure Laterality Date  . Cardiac catheterization  09/08/2008    NORMAL. EF 60%; Occluded SVG to PD that fills by collaterals and the remainder of his revascularization is satisfactory. he is managed medically.   . Coronary artery bypass graft  1997    CABG x 8  . Appendectomy      History  Smoking status  . Former Smoker -- 1.00 packs/day for 15 years  . Types: Cigarettes  . Quit date: 05/15/1958  Smokeless tobacco  . Not on file    History  Alcohol Use No    Family History  Problem Relation Age of Onset  . Heart failure Mother 52    Reviw of Systems:  Reviewed in the HPI.  All other systems are negative.  Physical Exam: Blood pressure 150/74, pulse 75, height 6' (1.829  m), weight 188 lb (85.276 kg). General: Well developed, well nourished, in no acute distress.  Head: Normocephalic, atraumatic, sclera non-icteric, mucus membranes are moist,   Neck: Supple. Carotids are 2 + without bruits. No JVD  Lungs: Clear bilaterally to auscultation.  Heart: regular rate.  normal  S1 S2. No murmurs, gallops or rubs.  Abdomen: Soft, non-tender, non-distended with normal bowel sounds. No hepatomegaly. No rebound/guarding. No masses.  Msk:  Strength and tone are normal  Extremities: No clubbing or cyanosis. No edema.  Distal pedal pulses are 2+ and equal bilaterally.  Neuro: Alert and oriented X 3. Moves all extremities spontaneously.  Psych:  Responds to questions appropriately with a normal affect.  ECG:  08/30/2012: Normal sinus rhythm at 75  with occasional premature atrial contractions. Possible inferolateral myocardial  infarction. Assessment / Plan:

## 2012-09-03 DIAGNOSIS — E039 Hypothyroidism, unspecified: Secondary | ICD-10-CM | POA: Diagnosis not present

## 2012-09-03 DIAGNOSIS — Z951 Presence of aortocoronary bypass graft: Secondary | ICD-10-CM | POA: Diagnosis not present

## 2012-09-03 DIAGNOSIS — Z0000 Encounter for general adult medical examination without abnormal findings: Secondary | ICD-10-CM | POA: Diagnosis not present

## 2012-09-03 DIAGNOSIS — R5381 Other malaise: Secondary | ICD-10-CM | POA: Diagnosis not present

## 2012-09-03 DIAGNOSIS — E538 Deficiency of other specified B group vitamins: Secondary | ICD-10-CM | POA: Diagnosis not present

## 2012-09-03 DIAGNOSIS — Z6825 Body mass index (BMI) 25.0-25.9, adult: Secondary | ICD-10-CM | POA: Diagnosis not present

## 2012-09-03 DIAGNOSIS — D7589 Other specified diseases of blood and blood-forming organs: Secondary | ICD-10-CM | POA: Diagnosis not present

## 2012-09-03 DIAGNOSIS — Z1212 Encounter for screening for malignant neoplasm of rectum: Secondary | ICD-10-CM | POA: Diagnosis not present

## 2012-09-03 DIAGNOSIS — R5383 Other fatigue: Secondary | ICD-10-CM | POA: Diagnosis not present

## 2012-09-03 DIAGNOSIS — M949 Disorder of cartilage, unspecified: Secondary | ICD-10-CM | POA: Diagnosis not present

## 2012-09-03 DIAGNOSIS — R7301 Impaired fasting glucose: Secondary | ICD-10-CM | POA: Diagnosis not present

## 2012-09-03 DIAGNOSIS — Z1331 Encounter for screening for depression: Secondary | ICD-10-CM | POA: Diagnosis not present

## 2012-09-03 DIAGNOSIS — I10 Essential (primary) hypertension: Secondary | ICD-10-CM | POA: Diagnosis not present

## 2012-09-03 DIAGNOSIS — M899 Disorder of bone, unspecified: Secondary | ICD-10-CM | POA: Diagnosis not present

## 2012-09-03 DIAGNOSIS — I259 Chronic ischemic heart disease, unspecified: Secondary | ICD-10-CM | POA: Diagnosis not present

## 2012-09-06 NOTE — Progress Notes (Signed)
Quick Note:  Preliminary report reviewed by triage nurse and sent to MD desk. ______ 

## 2012-09-10 DIAGNOSIS — E538 Deficiency of other specified B group vitamins: Secondary | ICD-10-CM | POA: Diagnosis not present

## 2012-10-03 DIAGNOSIS — Z85828 Personal history of other malignant neoplasm of skin: Secondary | ICD-10-CM | POA: Diagnosis not present

## 2012-10-03 DIAGNOSIS — D239 Other benign neoplasm of skin, unspecified: Secondary | ICD-10-CM | POA: Diagnosis not present

## 2012-10-03 DIAGNOSIS — D485 Neoplasm of uncertain behavior of skin: Secondary | ICD-10-CM | POA: Diagnosis not present

## 2012-10-03 DIAGNOSIS — C4442 Squamous cell carcinoma of skin of scalp and neck: Secondary | ICD-10-CM | POA: Diagnosis not present

## 2012-10-03 DIAGNOSIS — D230 Other benign neoplasm of skin of lip: Secondary | ICD-10-CM | POA: Diagnosis not present

## 2012-10-03 DIAGNOSIS — L570 Actinic keratosis: Secondary | ICD-10-CM | POA: Diagnosis not present

## 2012-10-15 DIAGNOSIS — E538 Deficiency of other specified B group vitamins: Secondary | ICD-10-CM | POA: Diagnosis not present

## 2012-10-31 DIAGNOSIS — E538 Deficiency of other specified B group vitamins: Secondary | ICD-10-CM | POA: Diagnosis not present

## 2012-11-05 ENCOUNTER — Other Ambulatory Visit: Payer: Self-pay | Admitting: *Deleted

## 2012-11-05 MED ORDER — ROSUVASTATIN CALCIUM 10 MG PO TABS: 10.0000 mg | ORAL_TABLET | Freq: Every day | ORAL | Status: DC

## 2012-11-05 MED ORDER — ALLOPURINOL 300 MG PO TABS: 300.0000 mg | ORAL_TABLET | Freq: Every day | ORAL | Status: DC

## 2012-11-05 NOTE — Telephone Encounter (Signed)
Fax Received. Refill Completed. Arlean Thies Chowoe (R.M.A)   

## 2012-11-26 ENCOUNTER — Other Ambulatory Visit: Payer: Self-pay | Admitting: Gastroenterology

## 2012-11-26 DIAGNOSIS — R1310 Dysphagia, unspecified: Secondary | ICD-10-CM

## 2012-12-03 ENCOUNTER — Ambulatory Visit
Admission: RE | Admit: 2012-12-03 | Discharge: 2012-12-03 | Disposition: A | Discharge status: Home / Self Care | Payer: Medicare Other | Source: Ambulatory Visit | Attending: Gastroenterology | Admitting: Gastroenterology

## 2012-12-03 DIAGNOSIS — K219 Gastro-esophageal reflux disease without esophagitis: Secondary | ICD-10-CM | POA: Diagnosis not present

## 2012-12-03 DIAGNOSIS — R1310 Dysphagia, unspecified: Secondary | ICD-10-CM

## 2012-12-03 DIAGNOSIS — K449 Diaphragmatic hernia without obstruction or gangrene: Secondary | ICD-10-CM | POA: Diagnosis not present

## 2012-12-04 DIAGNOSIS — K219 Gastro-esophageal reflux disease without esophagitis: Secondary | ICD-10-CM | POA: Diagnosis not present

## 2012-12-04 DIAGNOSIS — R1310 Dysphagia, unspecified: Secondary | ICD-10-CM | POA: Diagnosis not present

## 2012-12-04 DIAGNOSIS — Z79899 Other long term (current) drug therapy: Secondary | ICD-10-CM | POA: Diagnosis not present

## 2012-12-09 ENCOUNTER — Other Ambulatory Visit: Payer: Self-pay | Admitting: *Deleted

## 2012-12-09 MED ORDER — ALFUZOSIN HCL ER 10 MG PO TB24: 10.0000 mg | ORAL_TABLET | Freq: Every day | ORAL | Status: DC

## 2012-12-17 DIAGNOSIS — E538 Deficiency of other specified B group vitamins: Secondary | ICD-10-CM | POA: Diagnosis not present

## 2013-01-16 DIAGNOSIS — H518 Other specified disorders of binocular movement: Secondary | ICD-10-CM | POA: Diagnosis not present

## 2013-01-17 DIAGNOSIS — Z23 Encounter for immunization: Secondary | ICD-10-CM | POA: Diagnosis not present

## 2013-01-30 DIAGNOSIS — L570 Actinic keratosis: Secondary | ICD-10-CM | POA: Diagnosis not present

## 2013-01-30 DIAGNOSIS — Z85828 Personal history of other malignant neoplasm of skin: Secondary | ICD-10-CM | POA: Diagnosis not present

## 2013-02-12 DIAGNOSIS — E538 Deficiency of other specified B group vitamins: Secondary | ICD-10-CM | POA: Diagnosis not present

## 2013-03-05 ENCOUNTER — Ambulatory Visit: Payer: Medicare Other | Admitting: Cardiovascular Disease

## 2013-03-05 DIAGNOSIS — E538 Deficiency of other specified B group vitamins: Secondary | ICD-10-CM | POA: Diagnosis not present

## 2013-03-05 DIAGNOSIS — R5381 Other malaise: Secondary | ICD-10-CM | POA: Diagnosis not present

## 2013-03-05 DIAGNOSIS — I10 Essential (primary) hypertension: Secondary | ICD-10-CM | POA: Diagnosis not present

## 2013-03-05 DIAGNOSIS — F528 Other sexual dysfunction not due to a substance or known physiological condition: Secondary | ICD-10-CM | POA: Diagnosis not present

## 2013-03-05 DIAGNOSIS — Z6825 Body mass index (BMI) 25.0-25.9, adult: Secondary | ICD-10-CM | POA: Diagnosis not present

## 2013-03-05 DIAGNOSIS — E785 Hyperlipidemia, unspecified: Secondary | ICD-10-CM | POA: Diagnosis not present

## 2013-03-05 DIAGNOSIS — R7301 Impaired fasting glucose: Secondary | ICD-10-CM | POA: Diagnosis not present

## 2013-03-05 DIAGNOSIS — Z23 Encounter for immunization: Secondary | ICD-10-CM | POA: Diagnosis not present

## 2013-03-06 ENCOUNTER — Ambulatory Visit (INDEPENDENT_AMBULATORY_CARE_PROVIDER_SITE_OTHER): Payer: Medicare Other | Admitting: Cardiovascular Disease

## 2013-03-06 ENCOUNTER — Encounter: Payer: Self-pay | Admitting: Cardiovascular Disease

## 2013-03-06 VITALS — BP 118/62 | HR 80 | Ht 72.0 in | Wt 186.0 lb

## 2013-03-06 DIAGNOSIS — I2510 Atherosclerotic heart disease of native coronary artery without angina pectoris: Secondary | ICD-10-CM

## 2013-03-06 LAB — HEPATIC FUNCTION PANEL
AST: 18 U/L (ref 0–37)
Albumin: 4.4 g/dL (ref 3.5–5.2)
Total Bilirubin: 0.7 mg/dL (ref 0.3–1.2)

## 2013-03-06 LAB — LIPID PANEL
HDL: 43.7 mg/dL (ref >39.00)
LDL Cholesterol: 43 mg/dL (ref 0–99)
Total CHOL/HDL Ratio: 2
Triglycerides: 81 mg/dL (ref 0.0–149.0)

## 2013-03-06 LAB — BASIC METABOLIC PANEL
CO2: 26 mEq/L (ref 19–32)
Calcium: 9.1 mg/dL (ref 8.4–10.5)
Chloride: 105 mEq/L (ref 96–112)
Creatinine, Ser: 1.3 mg/dL (ref 0.4–1.5)
Glucose, Bld: 108 mg/dL — ABNORMAL HIGH (ref 70–99)
Sodium: 138 mEq/L (ref 135–145)

## 2013-03-06 NOTE — Patient Instructions (Signed)
Your physician recommends that you return for a FASTING lipid profile: today   Your physician wants you to follow-up in: 6 months  You will receive a reminder letter in the mail two months in advance. If you don't receive a letter, please call our office to schedule the follow-up appointment.   

## 2013-03-06 NOTE — Progress Notes (Signed)
[place] H Courter Date of Birth  1926-09-08 [facility] Office  [address]    [address]   [address] [place], [place]  [address]    [place], [place]  [address] [phone]  Fax  [phone]  [phone]  Fax [phone]  Problems: 1. Coronary artery disease- status post CABG 1997.  2. Hyperlipidemia 3. Hypothyroidism   History of Present Illness:  Patrick [name] is an 79 yo with the above noted hx.  He exercises every day ( walks for an hour a day).  He complains of fatigue when doing yard work.  He's not had any cardiac complaints.  August 30, 2012:  He still complains of generalized fatigue.   He has been having fatigued for the past several office visits.  He is really frustrated with his fatigue.  Questions whether or not it is due to his medications.  Oct. 23, 2014:  Patrick [name] is doing well.  Exercising regularly - energy is ok.    Current Outpatient Prescriptions on File Prior to Visit  Medication Sig Dispense Refill  . alfuzosin (UROXATRAL) 10 MG 24 hr tablet Take 1 tablet (10 mg total) by mouth daily.  90 tablet  3  . allopurinol (ZYLOPRIM) 300 MG tablet Take 1 tablet (300 mg total) by mouth daily.  90 tablet  3  . aspirin 81 MG tablet Take 81 mg by mouth daily.        . Cholecalciferol (VITAMIN D) 2000 UNITS tablet Take 2,000 Units by mouth daily.      Marland Kitchen levothyroxine (SYNTHROID, LEVOTHROID) 100 MCG tablet Take 1 tablet (100 mcg total) by mouth daily.  90 tablet  3  . nebivolol (BYSTOLIC) 5 MG tablet Take 1 tablet (5 mg total) by mouth daily.  90 tablet  3  . pantoprazole (PROTONIX) 40 MG tablet Take 40 mg by mouth daily.      . rosuvastatin (CRESTOR) 10 MG tablet Take 1 tablet (10 mg total) by mouth daily.  90 tablet  3  . acetaminophen (TYLENOL) 325 MG tablet Take 325 mg by mouth every 6 (six) hours as needed for pain.      . nitroGLYCERIN (NITROSTAT) 0.4 MG SL tablet Place 1 tablet (0.4 mg total) under the tongue every 5 (five) minutes as  needed.  25 tablet  6   No current facility-administered medications on file prior to visit.    Allergies  Allergen Reactions  . Halcion [Triazolam]   . Pravachol     Past Medical History  Diagnosis Date  . Hyperlipidemia   . GERD (gastroesophageal reflux disease)   . ED (erectile dysfunction)   . BPH (benign prostatic hypertrophy)   . Gout   . Peptic ulcer disease   . MI, acute, non ST segment elevation 2010    s/p cath with occluded SVG to PD that fills by collaterals and the remainder of his revasculsarization is satisfactory.   . IHD (ischemic heart disease)     Remote CABG in 1997  . Hypothyroidism   . Hypertension     Past Surgical History  Procedure Laterality Date  . Cardiac catheterization  09/08/2008    NORMAL. EF 60%; Occluded SVG to PD that fills by collaterals and the remainder of his revascularization is satisfactory. he is managed medically.   . Coronary artery bypass graft  1997    CABG x 8  . Appendectomy      History  Smoking status  . Former  Smoker -- 1.00 packs/day for 15 years  . Types: Cigarettes  . Quit date: 05/15/1958  Smokeless tobacco  . Not on file    History  Alcohol Use No    Family History  Problem Relation Age of Onset  . Heart failure Mother 26    Reviw of Systems:  Reviewed in the HPI.  All other systems are negative.  Physical Exam: Blood pressure 118/62, pulse 80, height 6' (1.829 m), weight 186 lb (84.369 kg). General: Well developed, well nourished, in no acute distress.  Head: Normocephalic, atraumatic, sclera non-icteric, mucus membranes are moist,   Neck: Supple. Carotids are 2 + without bruits. No JVD  Lungs: Clear bilaterally to auscultation.  Heart: regular rate.  normal  S1 S2. No murmurs, gallops or rubs.  Abdomen: Soft, non-tender, non-distended with normal bowel sounds. No hepatomegaly. No rebound/guarding. No masses.  Msk:  Strength and tone are normal  Extremities: No clubbing or cyanosis. No  edema.  Distal pedal pulses are 2+ and equal bilaterally.  Neuro: Alert and oriented X 3. Moves all extremities spontaneously.  Psych:  Responds to questions appropriately with a normal affect.  ECG:  08/30/2012: Normal sinus rhythm at 75  with occasional premature atrial contractions. Possible inferolateral myocardial infarction.  Assessment / Plan:

## 2013-03-06 NOTE — Assessment & Plan Note (Addendum)
Paranasal are stable. He's not had any episodes of angina. He's exercising on a fairly regular basis. He does complain of some fatigue but none of it sounds like ischemia  We'll make sure that his cholesterol is well controlled. I think that he may have had labs drawn at his medical doctors office yesterday. If not, we'll draw them  today

## 2013-03-18 DIAGNOSIS — E538 Deficiency of other specified B group vitamins: Secondary | ICD-10-CM | POA: Diagnosis not present

## 2013-04-15 DIAGNOSIS — E538 Deficiency of other specified B group vitamins: Secondary | ICD-10-CM | POA: Diagnosis not present

## 2013-04-22 DIAGNOSIS — E559 Vitamin D deficiency, unspecified: Secondary | ICD-10-CM | POA: Diagnosis not present

## 2013-04-22 DIAGNOSIS — N401 Enlarged prostate with lower urinary tract symptoms: Secondary | ICD-10-CM | POA: Diagnosis not present

## 2013-04-22 DIAGNOSIS — N529 Male erectile dysfunction, unspecified: Secondary | ICD-10-CM | POA: Diagnosis not present

## 2013-04-22 DIAGNOSIS — N139 Obstructive and reflux uropathy, unspecified: Secondary | ICD-10-CM | POA: Diagnosis not present

## 2013-04-23 ENCOUNTER — Telehealth: Payer: Self-pay | Admitting: Nurse Practitioner

## 2013-04-23 ENCOUNTER — Other Ambulatory Visit: Payer: Self-pay

## 2013-04-23 MED ORDER — NEBIVOLOL HCL 5 MG PO TABS: 5.0000 mg | ORAL_TABLET | Freq: Every day | ORAL | Status: DC

## 2013-04-23 NOTE — Telephone Encounter (Signed)
Called today to report diffuse aching - questions if it is related to his Crestor - will stop for 2 weeks and then call back with update. If symptoms resolve, will switch statin or restart Crestor at lower dose, if symptoms do not resolve, he may resume his Crestor and will be in touch with his PCP.

## 2013-05-12 ENCOUNTER — Telehealth: Payer: Self-pay | Admitting: Nurse Practitioner

## 2013-05-12 NOTE — Telephone Encounter (Signed)
Received phone call today in follow up of last call - has stopped his statin due to diffuse myalgias - some better but not totally resolved. He will make an appointment with PCP and stay off his statin in the interim.  Rosalio Macadamia, RN, ANP-C [facility] [address] [place], [place]  [address]

## 2013-05-14 DIAGNOSIS — E538 Deficiency of other specified B group vitamins: Secondary | ICD-10-CM | POA: Diagnosis not present

## 2013-05-14 DIAGNOSIS — E039 Hypothyroidism, unspecified: Secondary | ICD-10-CM | POA: Diagnosis not present

## 2013-05-14 DIAGNOSIS — E785 Hyperlipidemia, unspecified: Secondary | ICD-10-CM | POA: Diagnosis not present

## 2013-05-14 DIAGNOSIS — J069 Acute upper respiratory infection, unspecified: Secondary | ICD-10-CM | POA: Diagnosis not present

## 2013-05-14 DIAGNOSIS — IMO0001 Reserved for inherently not codable concepts without codable children: Secondary | ICD-10-CM | POA: Diagnosis not present

## 2013-05-14 DIAGNOSIS — Z6825 Body mass index (BMI) 25.0-25.9, adult: Secondary | ICD-10-CM | POA: Diagnosis not present

## 2013-05-20 DIAGNOSIS — E538 Deficiency of other specified B group vitamins: Secondary | ICD-10-CM | POA: Diagnosis not present

## 2013-06-17 DIAGNOSIS — E538 Deficiency of other specified B group vitamins: Secondary | ICD-10-CM | POA: Diagnosis not present

## 2013-06-19 DIAGNOSIS — Z85828 Personal history of other malignant neoplasm of skin: Secondary | ICD-10-CM | POA: Diagnosis not present

## 2013-06-19 DIAGNOSIS — D485 Neoplasm of uncertain behavior of skin: Secondary | ICD-10-CM | POA: Diagnosis not present

## 2013-06-19 DIAGNOSIS — L570 Actinic keratosis: Secondary | ICD-10-CM | POA: Diagnosis not present

## 2013-06-19 DIAGNOSIS — L821 Other seborrheic keratosis: Secondary | ICD-10-CM | POA: Diagnosis not present

## 2013-06-24 DIAGNOSIS — M2550 Pain in unspecified joint: Secondary | ICD-10-CM | POA: Diagnosis not present

## 2013-06-24 DIAGNOSIS — E039 Hypothyroidism, unspecified: Secondary | ICD-10-CM | POA: Diagnosis not present

## 2013-06-24 DIAGNOSIS — B351 Tinea unguium: Secondary | ICD-10-CM | POA: Diagnosis not present

## 2013-06-24 DIAGNOSIS — Z6825 Body mass index (BMI) 25.0-25.9, adult: Secondary | ICD-10-CM | POA: Diagnosis not present

## 2013-06-24 DIAGNOSIS — E538 Deficiency of other specified B group vitamins: Secondary | ICD-10-CM | POA: Diagnosis not present

## 2013-06-24 DIAGNOSIS — IMO0001 Reserved for inherently not codable concepts without codable children: Secondary | ICD-10-CM | POA: Diagnosis not present

## 2013-07-22 DIAGNOSIS — E538 Deficiency of other specified B group vitamins: Secondary | ICD-10-CM | POA: Diagnosis not present

## 2013-08-13 DIAGNOSIS — E538 Deficiency of other specified B group vitamins: Secondary | ICD-10-CM | POA: Diagnosis not present

## 2013-08-26 ENCOUNTER — Other Ambulatory Visit: Payer: Self-pay

## 2013-08-26 MED ORDER — ALFUZOSIN HCL ER 10 MG PO TB24: 10.0000 mg | ORAL_TABLET | Freq: Every day | ORAL | Status: DC

## 2013-08-27 ENCOUNTER — Ambulatory Visit (INDEPENDENT_AMBULATORY_CARE_PROVIDER_SITE_OTHER): Payer: Medicare Other | Admitting: Cardiovascular Disease

## 2013-08-27 ENCOUNTER — Encounter: Payer: Self-pay | Admitting: Cardiovascular Disease

## 2013-08-27 VITALS — BP 137/71 | HR 59 | Ht 72.0 in | Wt 182.6 lb

## 2013-08-27 DIAGNOSIS — E785 Hyperlipidemia, unspecified: Secondary | ICD-10-CM | POA: Diagnosis not present

## 2013-08-27 DIAGNOSIS — I2510 Atherosclerotic heart disease of native coronary artery without angina pectoris: Secondary | ICD-10-CM

## 2013-08-27 LAB — BASIC METABOLIC PANEL
BUN: 19 mg/dL (ref 6–23)
CALCIUM: 8.9 mg/dL (ref 8.4–10.5)
CO2: 26 mEq/L (ref 19–32)
CREATININE: 1.1 mg/dL (ref 0.4–1.5)
Chloride: 105 mEq/L (ref 96–112)
GFR: 64.97 mL/min (ref >60.00)
Glucose, Bld: 93 mg/dL (ref 70–99)
POTASSIUM: 4.1 meq/L (ref 3.5–5.1)
Sodium: 138 mEq/L (ref 135–145)

## 2013-08-27 LAB — HEPATIC FUNCTION PANEL
ALBUMIN: 4.2 g/dL (ref 3.5–5.2)
ALT: 13 U/L (ref 0–53)
AST: 17 U/L (ref 0–37)
Alkaline Phosphatase: 46 U/L (ref 39–117)
BILIRUBIN TOTAL: 0.9 mg/dL (ref 0.3–1.2)
Bilirubin, Direct: 0.1 mg/dL (ref 0.0–0.3)
Total Protein: 7 g/dL (ref 6.0–8.3)

## 2013-08-27 LAB — LIPID PANEL
CHOLESTEROL: 166 mg/dL (ref 0–200)
HDL: 38.8 mg/dL — ABNORMAL LOW (ref >39.00)
LDL Cholesterol: 108 mg/dL — ABNORMAL HIGH (ref 0–99)
Total CHOL/HDL Ratio: 4
Triglycerides: 94 mg/dL (ref 0.0–149.0)
VLDL: 18.8 mg/dL (ref 0.0–40.0)

## 2013-08-27 NOTE — Patient Instructions (Signed)
Your physician recommends that you have lab work today:  Lipid/liver/basic metabolic panel  Your physician recommends that you return for fasting lab work in: 6 months before you see Dr. Acie Fredrickson  Your physician wants you to follow-up in: 6 months with Dr. Acie Fredrickson.  You will receive a reminder letter in the mail two months in advance. If you don't receive a letter, please call our office to schedule the follow-up appointment.  Your physician recommends that you continue on your current medications as directed. Please refer to the Current Medication list given to you today.

## 2013-08-27 NOTE — Progress Notes (Signed)
[place] H Gentles Date of Birth  Oct 15, 1926 [place] [address]    [place]   [place], [place]  [address]    [place], [place]  [address] [phone]  Fax  [phone]  [phone]  Fax [phone]  Problems: 1. Coronary artery disease- status post CABG 1997.  2. Hyperlipidemia 3. Hypothyroidism   History of Present Illness:  Patrick [name] is an 80 yo with the above noted hx.  He exercises every day ( walks for an hour a day).  He complains of fatigue when doing yard work.  He's not had any cardiac complaints.  August 30, 2012:  He still complains of generalized fatigue.   He has been having fatigued for the past several office visits.  He is really frustrated with his fatigue.  Questions whether or not it is due to his medications.  Oct. 23, 2014:  Patrick [name] is doing well.  Exercising regularly - energy is ok.    August 27, 2013:  Patrick [name] is doing ok.  He walks occasionally but not regularly.  He knows that he needs to get back into it.   Fatigues easily.    Current Outpatient Prescriptions on File Prior to Visit  Medication Sig Dispense Refill  . alfuzosin (UROXATRAL) 10 MG 24 hr tablet Take 1 tablet (10 mg total) by mouth daily.  90 tablet  3  . allopurinol (ZYLOPRIM) 300 MG tablet Take 1 tablet (300 mg total) by mouth daily.  90 tablet  3  . aspirin 81 MG tablet Take 81 mg by mouth daily.        . Cholecalciferol (VITAMIN D) 2000 UNITS tablet Take 2,000 Units by mouth daily.      . nebivolol (BYSTOLIC) 5 MG tablet Take 1 tablet (5 mg total) by mouth daily.  90 tablet  1  . nitroGLYCERIN (NITROSTAT) 0.4 MG SL tablet Place 1 tablet (0.4 mg total) under the tongue every 5 (five) minutes as needed.  25 tablet  6  . pantoprazole (PROTONIX) 40 MG tablet Take 40 mg by mouth daily.       No current facility-administered medications on file prior to visit.    Allergies  Allergen Reactions  . Halcion [Triazolam]   . Pravachol      Past Medical History  Diagnosis Date  . Hyperlipidemia   . GERD (gastroesophageal reflux disease)   . ED (erectile dysfunction)   . BPH (benign prostatic hypertrophy)   . Gout   . Peptic ulcer disease   . MI, acute, non ST segment elevation 2010    s/p cath with occluded SVG to PD that fills by collaterals and the remainder of his revasculsarization is satisfactory.   . IHD (ischemic heart disease)     Remote CABG in 1997  . Hypothyroidism   . Hypertension     Past Surgical History  Procedure Laterality Date  . Cardiac catheterization  09/08/2008    NORMAL. EF 60%; Occluded SVG to PD that fills by collaterals and the remainder of his revascularization is satisfactory. he is managed medically.   . Coronary artery bypass graft  1997    CABG x 8  . Appendectomy      History  Smoking status  . Former Smoker -- 1.00 packs/day for 15 years  . Types: Cigarettes  . Quit date: 05/15/1958  Smokeless tobacco  . Not on file    History  Alcohol Use No  Family History  Problem Relation Age of Onset  . Heart failure Mother 9    Reviw of Systems:  Reviewed in the HPI.  All other systems are negative.  Physical Exam: Blood pressure 137/71, pulse 59, height 6' (1.829 m), weight 182 lb 9.6 oz (82.827 kg). General: Well developed, well nourished, in no acute distress.  Head: Normocephalic, atraumatic, sclera non-icteric, mucus membranes are moist,  Neck: Supple. Carotids are 2 + without bruits. No JVD Lungs: Clear bilaterally to auscultation. Heart: regular rate.  normal  S1 S2. No murmurs, gallops or rubs. Abdomen: Soft, non-tender, non-distended with normal bowel sounds. No hepatomegaly. No rebound/guarding. No masses. Msk:  Strength and tone are normal Extremities: No clubbing or cyanosis. No edema.  Distal pedal pulses are 2+ and equal bilaterally. Neuro: Alert and oriented X 3. Moves all extremities spontaneously. Psych:  Responds to questions appropriately with a  normal affect.  ECG:  August 27, 2013:  Sinus brady at 43.  LAD.   Assessment / Plan:

## 2013-08-27 NOTE — Assessment & Plan Note (Signed)
Patrick [name] has not had any troubles from a cardiac standpoint.   I've encouraged him to walk on a regular basis. . Will check fasting labs today.  I 'll see him in 6 months for ov and fasting labs.

## 2013-09-17 DIAGNOSIS — E538 Deficiency of other specified B group vitamins: Secondary | ICD-10-CM | POA: Diagnosis not present

## 2013-09-17 DIAGNOSIS — Z125 Encounter for screening for malignant neoplasm of prostate: Secondary | ICD-10-CM | POA: Diagnosis not present

## 2013-09-17 DIAGNOSIS — I10 Essential (primary) hypertension: Secondary | ICD-10-CM | POA: Diagnosis not present

## 2013-09-17 DIAGNOSIS — M109 Gout, unspecified: Secondary | ICD-10-CM | POA: Diagnosis not present

## 2013-09-17 DIAGNOSIS — E039 Hypothyroidism, unspecified: Secondary | ICD-10-CM | POA: Diagnosis not present

## 2013-09-17 DIAGNOSIS — M899 Disorder of bone, unspecified: Secondary | ICD-10-CM | POA: Diagnosis not present

## 2013-09-17 DIAGNOSIS — R7301 Impaired fasting glucose: Secondary | ICD-10-CM | POA: Diagnosis not present

## 2013-09-17 DIAGNOSIS — E785 Hyperlipidemia, unspecified: Secondary | ICD-10-CM | POA: Diagnosis not present

## 2013-09-17 DIAGNOSIS — M949 Disorder of cartilage, unspecified: Secondary | ICD-10-CM | POA: Diagnosis not present

## 2013-09-18 DIAGNOSIS — L570 Actinic keratosis: Secondary | ICD-10-CM | POA: Diagnosis not present

## 2013-09-18 DIAGNOSIS — Z85828 Personal history of other malignant neoplasm of skin: Secondary | ICD-10-CM | POA: Diagnosis not present

## 2013-09-18 DIAGNOSIS — I10 Essential (primary) hypertension: Secondary | ICD-10-CM | POA: Diagnosis not present

## 2013-09-19 DIAGNOSIS — M949 Disorder of cartilage, unspecified: Secondary | ICD-10-CM | POA: Diagnosis not present

## 2013-09-19 DIAGNOSIS — Z1331 Encounter for screening for depression: Secondary | ICD-10-CM | POA: Diagnosis not present

## 2013-09-19 DIAGNOSIS — I10 Essential (primary) hypertension: Secondary | ICD-10-CM | POA: Diagnosis not present

## 2013-09-19 DIAGNOSIS — Z0000 Encounter for general adult medical examination without abnormal findings: Secondary | ICD-10-CM | POA: Diagnosis not present

## 2013-09-19 DIAGNOSIS — I259 Chronic ischemic heart disease, unspecified: Secondary | ICD-10-CM | POA: Diagnosis not present

## 2013-09-19 DIAGNOSIS — E538 Deficiency of other specified B group vitamins: Secondary | ICD-10-CM | POA: Diagnosis not present

## 2013-09-19 DIAGNOSIS — E785 Hyperlipidemia, unspecified: Secondary | ICD-10-CM | POA: Diagnosis not present

## 2013-09-19 DIAGNOSIS — M899 Disorder of bone, unspecified: Secondary | ICD-10-CM | POA: Diagnosis not present

## 2013-09-19 DIAGNOSIS — R7301 Impaired fasting glucose: Secondary | ICD-10-CM | POA: Diagnosis not present

## 2013-09-19 DIAGNOSIS — E039 Hypothyroidism, unspecified: Secondary | ICD-10-CM | POA: Diagnosis not present

## 2013-10-21 DIAGNOSIS — E538 Deficiency of other specified B group vitamins: Secondary | ICD-10-CM | POA: Diagnosis not present

## 2013-10-24 ENCOUNTER — Other Ambulatory Visit: Payer: Self-pay | Admitting: *Deleted

## 2013-10-24 MED ORDER — NEBIVOLOL HCL 5 MG PO TABS: 5.0000 mg | ORAL_TABLET | Freq: Every day | ORAL | Status: DC

## 2013-10-28 DIAGNOSIS — M899 Disorder of bone, unspecified: Secondary | ICD-10-CM | POA: Diagnosis not present

## 2013-11-13 ENCOUNTER — Other Ambulatory Visit: Payer: Self-pay

## 2013-11-13 MED ORDER — ALLOPURINOL 300 MG PO TABS: 300.0000 mg | ORAL_TABLET | Freq: Every day | ORAL | Status: DC

## 2013-12-26 DIAGNOSIS — E039 Hypothyroidism, unspecified: Secondary | ICD-10-CM | POA: Diagnosis not present

## 2013-12-26 DIAGNOSIS — I10 Essential (primary) hypertension: Secondary | ICD-10-CM | POA: Diagnosis not present

## 2013-12-26 DIAGNOSIS — I259 Chronic ischemic heart disease, unspecified: Secondary | ICD-10-CM | POA: Diagnosis not present

## 2013-12-26 DIAGNOSIS — Z951 Presence of aortocoronary bypass graft: Secondary | ICD-10-CM | POA: Diagnosis not present

## 2013-12-26 DIAGNOSIS — E785 Hyperlipidemia, unspecified: Secondary | ICD-10-CM | POA: Diagnosis not present

## 2013-12-26 DIAGNOSIS — IMO0002 Reserved for concepts with insufficient information to code with codable children: Secondary | ICD-10-CM | POA: Diagnosis not present

## 2013-12-26 DIAGNOSIS — E538 Deficiency of other specified B group vitamins: Secondary | ICD-10-CM | POA: Diagnosis not present

## 2013-12-26 DIAGNOSIS — R7301 Impaired fasting glucose: Secondary | ICD-10-CM | POA: Diagnosis not present

## 2014-01-07 DIAGNOSIS — L570 Actinic keratosis: Secondary | ICD-10-CM | POA: Diagnosis not present

## 2014-01-07 DIAGNOSIS — Z85828 Personal history of other malignant neoplasm of skin: Secondary | ICD-10-CM | POA: Diagnosis not present

## 2014-01-07 DIAGNOSIS — C4442 Squamous cell carcinoma of skin of scalp and neck: Secondary | ICD-10-CM | POA: Diagnosis not present

## 2014-01-07 DIAGNOSIS — D485 Neoplasm of uncertain behavior of skin: Secondary | ICD-10-CM | POA: Diagnosis not present

## 2014-01-21 ENCOUNTER — Other Ambulatory Visit: Payer: Self-pay

## 2014-01-21 MED ORDER — NEBIVOLOL HCL 5 MG PO TABS: 5.0000 mg | ORAL_TABLET | Freq: Every day | ORAL | Status: DC

## 2014-02-10 DIAGNOSIS — E538 Deficiency of other specified B group vitamins: Secondary | ICD-10-CM | POA: Diagnosis not present

## 2014-02-10 DIAGNOSIS — Z85828 Personal history of other malignant neoplasm of skin: Secondary | ICD-10-CM | POA: Diagnosis not present

## 2014-02-10 DIAGNOSIS — Z23 Encounter for immunization: Secondary | ICD-10-CM | POA: Diagnosis not present

## 2014-02-10 DIAGNOSIS — L570 Actinic keratosis: Secondary | ICD-10-CM | POA: Diagnosis not present

## 2014-03-25 ENCOUNTER — Other Ambulatory Visit: Payer: Self-pay | Admitting: Nurse Practitioner

## 2014-03-25 MED ORDER — NITROGLYCERIN 0.4 MG SL SUBL: 0.4000 mg | SUBLINGUAL_TABLET | SUBLINGUAL | Status: DC | PRN

## 2014-04-28 ENCOUNTER — Other Ambulatory Visit: Payer: Self-pay | Admitting: Cardiovascular Disease

## 2014-05-14 DIAGNOSIS — L570 Actinic keratosis: Secondary | ICD-10-CM | POA: Diagnosis not present

## 2014-05-14 DIAGNOSIS — Z85828 Personal history of other malignant neoplasm of skin: Secondary | ICD-10-CM | POA: Diagnosis not present

## 2014-05-14 DIAGNOSIS — D692 Other nonthrombocytopenic purpura: Secondary | ICD-10-CM | POA: Diagnosis not present

## 2014-05-14 DIAGNOSIS — L821 Other seborrheic keratosis: Secondary | ICD-10-CM | POA: Diagnosis not present

## 2014-05-18 DIAGNOSIS — N401 Enlarged prostate with lower urinary tract symptoms: Secondary | ICD-10-CM | POA: Diagnosis not present

## 2014-05-18 DIAGNOSIS — N5201 Erectile dysfunction due to arterial insufficiency: Secondary | ICD-10-CM | POA: Diagnosis not present

## 2014-07-16 ENCOUNTER — Other Ambulatory Visit: Payer: Self-pay

## 2014-07-16 MED ORDER — NEBIVOLOL HCL 5 MG PO TABS: ORAL_TABLET | ORAL | Status: DC

## 2014-07-24 ENCOUNTER — Telehealth: Payer: Self-pay

## 2014-07-28 NOTE — Telephone Encounter (Signed)
Bystolic PA request to [facility] - Part D Coverage Determination department re-submitted (re-faxed) with both ICD Codes I25.10 and I10. (CAD/[employer] HTN). Forms in Medical Records if needed.

## 2014-08-04 ENCOUNTER — Telehealth: Payer: Self-pay | Admitting: Nurse Practitioner

## 2014-08-04 NOTE — Telephone Encounter (Signed)
Patient was advised to discontinue Bystolic by  Truitt Merle, NP and to monitor blood pressure.  She advised patient to call back with elevated BP or concerns

## 2014-08-04 NOTE — Telephone Encounter (Signed)
Phone call today. [employer] has denied his Bystolic 5 mg  BP has been stable. He has had chronic issues with fatigue.  Will stop the Bystolic and have him monitor at home.   Just Juluis Rainier - getting married tomorrow.  Burtis Junes, RN, [place] [address] [place], [place]  [address] [phone]

## 2014-10-14 DIAGNOSIS — Z85828 Personal history of other malignant neoplasm of skin: Secondary | ICD-10-CM | POA: Diagnosis not present

## 2014-10-14 DIAGNOSIS — L570 Actinic keratosis: Secondary | ICD-10-CM | POA: Diagnosis not present

## 2014-12-07 DIAGNOSIS — H5005 Alternating esotropia: Secondary | ICD-10-CM | POA: Diagnosis not present

## 2014-12-07 DIAGNOSIS — H5203 Hypermetropia, bilateral: Secondary | ICD-10-CM | POA: Diagnosis not present

## 2014-12-08 DIAGNOSIS — R350 Frequency of micturition: Secondary | ICD-10-CM | POA: Diagnosis not present

## 2014-12-08 DIAGNOSIS — N5201 Erectile dysfunction due to arterial insufficiency: Secondary | ICD-10-CM | POA: Diagnosis not present

## 2014-12-08 DIAGNOSIS — N401 Enlarged prostate with lower urinary tract symptoms: Secondary | ICD-10-CM | POA: Diagnosis not present

## 2014-12-14 ENCOUNTER — Other Ambulatory Visit: Payer: Self-pay | Admitting: Cardiovascular Disease

## 2015-01-04 DIAGNOSIS — E039 Hypothyroidism, unspecified: Secondary | ICD-10-CM | POA: Diagnosis not present

## 2015-01-04 DIAGNOSIS — M859 Disorder of bone density and structure, unspecified: Secondary | ICD-10-CM | POA: Diagnosis not present

## 2015-01-04 DIAGNOSIS — E785 Hyperlipidemia, unspecified: Secondary | ICD-10-CM | POA: Diagnosis not present

## 2015-01-04 DIAGNOSIS — Z125 Encounter for screening for malignant neoplasm of prostate: Secondary | ICD-10-CM | POA: Diagnosis not present

## 2015-01-04 DIAGNOSIS — R7301 Impaired fasting glucose: Secondary | ICD-10-CM | POA: Diagnosis not present

## 2015-01-04 DIAGNOSIS — E538 Deficiency of other specified B group vitamins: Secondary | ICD-10-CM | POA: Diagnosis not present

## 2015-01-11 DIAGNOSIS — Z23 Encounter for immunization: Secondary | ICD-10-CM | POA: Diagnosis not present

## 2015-01-11 DIAGNOSIS — Z1389 Encounter for screening for other disorder: Secondary | ICD-10-CM | POA: Diagnosis not present

## 2015-01-11 DIAGNOSIS — I10 Essential (primary) hypertension: Secondary | ICD-10-CM | POA: Diagnosis not present

## 2015-01-11 DIAGNOSIS — Z0000 Encounter for general adult medical examination without abnormal findings: Secondary | ICD-10-CM | POA: Diagnosis not present

## 2015-01-11 DIAGNOSIS — Z6824 Body mass index (BMI) 24.0-24.9, adult: Secondary | ICD-10-CM | POA: Diagnosis not present

## 2015-01-12 ENCOUNTER — Telehealth: Payer: Self-pay | Admitting: Cardiovascular Disease

## 2015-01-12 ENCOUNTER — Encounter: Payer: Self-pay | Admitting: Cardiovascular Disease

## 2015-01-12 ENCOUNTER — Ambulatory Visit (INDEPENDENT_AMBULATORY_CARE_PROVIDER_SITE_OTHER): Payer: Medicare Other | Admitting: Cardiovascular Disease

## 2015-01-12 VITALS — BP 120/80 | HR 65 | Ht 72.0 in | Wt 180.0 lb

## 2015-01-12 DIAGNOSIS — I2510 Atherosclerotic heart disease of native coronary artery without angina pectoris: Secondary | ICD-10-CM

## 2015-01-12 DIAGNOSIS — E785 Hyperlipidemia, unspecified: Secondary | ICD-10-CM | POA: Diagnosis not present

## 2015-01-12 NOTE — Telephone Encounter (Signed)
New message      Calling to tell Patrick [name]---medication list is correct.

## 2015-01-12 NOTE — Telephone Encounter (Signed)
Information given to Lyla Son, [place] who noted agreement

## 2015-01-12 NOTE — Progress Notes (Signed)
[place] H Lu[place] Date of Birth  Nov 25, 1926 [place] [address]    [place], [place]  [address]    [place], [place]  [address] [phone]  Fax  [phone]  [phone]  Fax [phone]  Problems: 1. Coronary artery disease- status post CABG 1997.  2. Hyperlipidemia 3. Hypothyroidism   History of Present Illness:  Patrick [name] is an 81 yo with the above noted hx.  He exercises every day ( walks for an hour a day).  He complains of fatigue when doing yard work.  He's not had any cardiac complaints.  August 30, 2012:  He still complains of generalized fatigue.   He has been having fatigued for the past several office visits.  He is really frustrated with his fatigue.  Questions whether or not it is due to his medications.  Oct. 23, 2014:  Patrick [name] is doing well.  Exercising regularly - energy is ok.    August 27, 2013:  Patrick [name] is doing ok.  He walks occasionally but not regularly.  He knows that he needs to get back into it.   Fatigues easily.    Aug. 30, 2016: Patrick [name] is doing well. No CP  Has had some night sweats - has  occurred several time Does his yard work without problems    Current Outpatient Prescriptions on File Prior to Visit  Medication Sig Dispense Refill  . alfuzosin (UROXATRAL) 10 MG 24 hr tablet Take 1 tablet (10 mg total) by mouth daily. 90 tablet 3  . aspirin 81 MG tablet Take 81 mg by mouth daily.      . Cholecalciferol (VITAMIN D) 2000 UNITS tablet Take 2,000 Units by mouth daily.    . nitroGLYCERIN (NITROSTAT) 0.4 MG SL tablet Place 1 tablet (0.4 mg total) under the tongue every 5 (five) minutes as needed. 25 tablet 6  . pantoprazole (PROTONIX) 40 MG tablet Take 40 mg by mouth daily.     No current facility-administered medications on file prior to visit.    Allergies  Allergen Reactions  . Halcion [Triazolam]   . Pravachol     Past Medical History  Diagnosis Date  . Hyperlipidemia   .  GERD (gastroesophageal reflux disease)   . ED (erectile dysfunction)   . BPH (benign prostatic hypertrophy)   . Gout   . Peptic ulcer disease   . MI, acute, non ST segment elevation 2010    s/p cath with occluded SVG to PD that fills by collaterals and the remainder of his revasculsarization is satisfactory.   . IHD (ischemic heart disease)     Remote CABG in 1997  . Hypothyroidism   . Hypertension     Past Surgical History  Procedure Laterality Date  . Cardiac catheterization  09/08/2008    NORMAL. EF 60%; Occluded SVG to PD that fills by collaterals and the remainder of his revascularization is satisfactory. he is managed medically.   . Coronary artery bypass graft  1997    CABG x 8  . Appendectomy      History  Smoking status  . Former Smoker -- 1.00 packs/day for 15 years  . Types: Cigarettes  . Quit date: 05/15/1958  Smokeless tobacco  . Not on file    History  Alcohol Use No    Family History  Problem Relation Age of Onset  . Heart failure Mother 32    Reviw of Systems:  Reviewed in the HPI.  All other systems are negative.  Physical Exam: Blood pressure 120/80, pulse 65, height 6' (1.829 m), weight 81.647 kg (180 lb). General: Well developed, well nourished, in no acute distress.  Head: Normocephalic, atraumatic, sclera non-icteric, mucus membranes are moist,  Neck: Supple. Carotids are 2 + without bruits. No JVD Lungs: Clear bilaterally to auscultation. Heart: regular rate.  normal  S1 S2. No murmurs, gallops or rubs. Abdomen: Soft, non-tender, non-distended with normal bowel sounds. No hepatomegaly. No rebound/guarding. No masses. Msk:  Strength and tone are normal Extremities: No clubbing or cyanosis. No edema.  Distal pedal pulses are 2+ and equal bilaterally. Neuro: Alert and oriented X 3. Moves all extremities spontaneously. Psych:  Responds to questions appropriately with a normal affect.  ECG:  Aug. 30, NSR at 1, old INf. MI   Assessment /  Plan:   1. Coronary artery disease- status post CABG 1997. - He's not having any episodes of angina. Continue current medications.  2. Hyperlipidemia-  his lipids are followed by Dr. Brigitte Pulse. Continue current medications   Brian Zeitlin, Wonda Cheng, MD  01/12/2015 8:44 AM    [place],  [place], [place]  [address] Pager [phone] Phone: [phone]; Fax: [phone]   [facility]  [address] [place], [place]  [address] [phone]   Fax [phone]

## 2015-01-12 NOTE — Patient Instructions (Signed)
Medication Instructions:  Your physician recommends that you continue on your current medications as directed. Please refer to the Current Medication list given to you today.   Labwork: None Ordered   Testing/Procedures: None Ordered   Follow-Up: Your physician wants you to follow-up in: 1 year with Dr. Nahser.  You will receive a reminder letter in the mail two months in advance. If you don't receive a letter, please call our office to schedule the follow-up appointment.      

## 2015-01-15 ENCOUNTER — Encounter: Payer: Self-pay | Admitting: Cardiovascular Disease

## 2015-02-09 DIAGNOSIS — Z1212 Encounter for screening for malignant neoplasm of rectum: Secondary | ICD-10-CM | POA: Diagnosis not present

## 2015-02-10 DIAGNOSIS — R3915 Urgency of urination: Secondary | ICD-10-CM | POA: Diagnosis not present

## 2015-02-10 DIAGNOSIS — N401 Enlarged prostate with lower urinary tract symptoms: Secondary | ICD-10-CM | POA: Diagnosis not present

## 2015-02-12 ENCOUNTER — Other Ambulatory Visit: Payer: Self-pay | Admitting: Urology

## 2015-02-12 DIAGNOSIS — I10 Essential (primary) hypertension: Secondary | ICD-10-CM | POA: Diagnosis not present

## 2015-02-12 DIAGNOSIS — Z6824 Body mass index (BMI) 24.0-24.9, adult: Secondary | ICD-10-CM | POA: Diagnosis not present

## 2015-03-12 NOTE — Patient Instructions (Addendum)
YOUR PROCEDURE IS SCHEDULED ON :  03/19/15  REPORT TO [facility] MAIN ENTRANCE FOLLOW SIGNS TO EAST ELEVATOR - GO TO 3rd FLOOR CHECK IN AT 3 EAST NURSES STATION (SHORT STAY) AT:   7:00 AM  CALL THIS NUMBER IF YOU HAVE PROBLEMS THE MORNING OF SURGERY [phone]  REMEMBER:ONLY 1 PER PERSON MAY GO TO SHORT STAY WITH YOU TO GET READY THE MORNING OF YOUR SURGERY  DO NOT EAT FOOD OR DRINK LIQUIDS AFTER MIDNIGHT  TAKE THESE MEDICINES THE MORNING OF SURGERY: METOPROLOL / PANTOPRAZOLE / SYNTHROID / ALLOPURINOL  YOU MAY NOT HAVE ANY METAL ON YOUR BODY INCLUDING HAIR PINS AND PIERCING'S. DO NOT WEAR JEWELRY, MAKEUP, LOTIONS, POWDERS OR PERFUMES. DO NOT WEAR NAIL POLISH. DO NOT SHAVE 48 HRS PRIOR TO SURGERY. MEN MAY SHAVE FACE AND NECK.  DO NOT [place]. [facility] IS NOT RESPONSIBLE FOR VALUABLES.  CONTACTS, DENTURES OR PARTIALS MAY NOT BE WORN TO SURGERY. LEAVE SUITCASE IN CAR. CAN BE BROUGHT TO ROOM AFTER SURGERY.  PATIENTS DISCHARGED THE DAY OF SURGERY WILL NOT BE ALLOWED TO DRIVE HOME.  PLEASE READ OVER THE FOLLOWING INSTRUCTION SHEETS _________________________________________________________________________________                                          [facility] - PREPARING FOR SURGERY  Before surgery, you can play an important role.  Because skin is not sterile, your skin needs to be as free of germs as possible.  You can reduce the number of germs on your skin by washing with CHG (chlorahexidine gluconate) soap before surgery.  CHG is an antiseptic cleaner which kills germs and bonds with the skin to continue killing germs even after washing. Please DO NOT use if you have an allergy to CHG or antibacterial soaps.  If your skin becomes reddened/irritated stop using the CHG and inform your nurse when you arrive at Short Stay. Do not shave (including legs and underarms) for at least 48 hours prior to the first CHG shower.  You may shave your  face. Please follow these instructions carefully:   1.  Shower with CHG Soap the night before surgery and the  morning of Surgery.   2.  If you choose to wash your hair, wash your hair first as usual with your  normal  Shampoo.   3.  After you shampoo, rinse your hair and body thoroughly to remove the  shampoo.                                         4.  Use CHG as you would any other liquid soap.  You can apply chg directly  to the skin and wash . Gently wash with scrungie or clean wascloth    5.  Apply the CHG Soap to your body ONLY FROM THE NECK DOWN.   Do not use on open                           Wound or open sores. Avoid contact with eyes, ears mouth and genitals (private parts).                        Genitals (private  parts) with your normal soap.              6.  Wash thoroughly, paying special attention to the area where your surgery  will be performed.   7.  Thoroughly rinse your body with warm water from the neck down.   8.  DO NOT shower/wash with your normal soap after using and rinsing off  the CHG Soap .                9.  Pat yourself dry with a clean towel.             10.  Wear clean night clothes to bed after shower             11.  Place clean sheets on your bed the night of your first shower and do not  sleep with pets.  Day of Surgery : Do not apply any lotions/deodorants the morning of surgery.  Please wear clean clothes to the hospital/surgery center.  FAILURE TO FOLLOW THESE INSTRUCTIONS MAY RESULT IN THE CANCELLATION OF YOUR SURGERY    PATIENT SIGNATURE_________________________________  ______________________________________________________________________

## 2015-03-15 ENCOUNTER — Encounter (HOSPITAL_COMMUNITY): Payer: Self-pay

## 2015-03-15 ENCOUNTER — Encounter (HOSPITAL_COMMUNITY)
Admission: RE | Admit: 2015-03-15 | Discharge: 2015-03-15 | Disposition: A | Discharge status: Home / Self Care | Payer: Medicare Other | Source: Ambulatory Visit | Attending: Urology | Admitting: Urology

## 2015-03-15 DIAGNOSIS — N400 Enlarged prostate without lower urinary tract symptoms: Secondary | ICD-10-CM | POA: Diagnosis not present

## 2015-03-15 DIAGNOSIS — Z01818 Encounter for other preprocedural examination: Secondary | ICD-10-CM | POA: Diagnosis not present

## 2015-03-15 HISTORY — DX: Diverticulosis of intestine, part unspecified, without perforation or abscess without bleeding: K57.90

## 2015-03-15 HISTORY — DX: Nocturia: R35.1

## 2015-03-15 HISTORY — DX: Inflammatory liver disease, unspecified: K75.9

## 2015-03-15 HISTORY — DX: Personal history of urinary calculi: Z87.442

## 2015-03-15 HISTORY — DX: Frequency of micturition: R35.0

## 2015-03-15 HISTORY — DX: Personal history of other malignant neoplasm of skin: Z85.828

## 2015-03-15 LAB — CBC
HCT: 44.1 % (ref 39.0–52.0)
HEMOGLOBIN: 14.9 g/dL (ref 13.0–17.0)
MCH: 32.7 pg (ref 26.0–34.0)
MCHC: 33.8 g/dL (ref 30.0–36.0)
MCV: 96.7 fL (ref 78.0–100.0)
Platelets: 105 10*3/uL — ABNORMAL LOW (ref 150–400)
RBC: 4.56 MIL/uL (ref 4.22–5.81)
RDW: 13.4 % (ref 11.5–15.5)
WBC: 3.6 10*3/uL — ABNORMAL LOW (ref 4.0–10.5)

## 2015-03-15 LAB — BASIC METABOLIC PANEL
Anion gap: 7 (ref 5–15)
BUN: 19 mg/dL (ref 6–20)
CHLORIDE: 105 mmol/L (ref 101–111)
CO2: 25 mmol/L (ref 22–32)
Calcium: 9.3 mg/dL (ref 8.9–10.3)
Creatinine, Ser: 1.2 mg/dL (ref 0.61–1.24)
GFR calc Af Amer: >60 mL/min (ref >60)
GFR calc non Af Amer: 53 mL/min — ABNORMAL LOW (ref >60)
GLUCOSE: 103 mg/dL — AB (ref 65–99)
POTASSIUM: 4 mmol/L (ref 3.5–5.1)
Sodium: 137 mmol/L (ref 135–145)

## 2015-03-15 NOTE — Progress Notes (Signed)
Abnormal CBC faxed to Dr.Tannenbaum

## 2015-03-19 ENCOUNTER — Encounter (HOSPITAL_COMMUNITY): Payer: Self-pay | Admitting: *Deleted

## 2015-03-19 ENCOUNTER — Ambulatory Visit (HOSPITAL_COMMUNITY): Payer: Medicare Other | Admitting: Anesthesiology

## 2015-03-19 ENCOUNTER — Encounter (HOSPITAL_COMMUNITY)
Admission: RE | Disposition: A | Discharge status: Home / Self Care | Payer: Self-pay | Source: Ambulatory Visit | Attending: Urology

## 2015-03-19 ENCOUNTER — Ambulatory Visit (HOSPITAL_COMMUNITY)
Admission: RE | Admit: 2015-03-19 | Discharge: 2015-03-19 | Disposition: A | Discharge status: Home / Self Care | Payer: Medicare Other | Source: Ambulatory Visit | Attending: Urology | Admitting: Urology

## 2015-03-19 DIAGNOSIS — N138 Other obstructive and reflux uropathy: Secondary | ICD-10-CM | POA: Diagnosis not present

## 2015-03-19 DIAGNOSIS — E039 Hypothyroidism, unspecified: Secondary | ICD-10-CM | POA: Insufficient documentation

## 2015-03-19 DIAGNOSIS — R3912 Poor urinary stream: Secondary | ICD-10-CM | POA: Diagnosis not present

## 2015-03-19 DIAGNOSIS — K219 Gastro-esophageal reflux disease without esophagitis: Secondary | ICD-10-CM | POA: Diagnosis not present

## 2015-03-19 DIAGNOSIS — Z7982 Long term (current) use of aspirin: Secondary | ICD-10-CM | POA: Diagnosis not present

## 2015-03-19 DIAGNOSIS — R350 Frequency of micturition: Secondary | ICD-10-CM | POA: Insufficient documentation

## 2015-03-19 DIAGNOSIS — Z79899 Other long term (current) drug therapy: Secondary | ICD-10-CM | POA: Insufficient documentation

## 2015-03-19 DIAGNOSIS — N400 Enlarged prostate without lower urinary tract symptoms: Secondary | ICD-10-CM | POA: Diagnosis not present

## 2015-03-19 DIAGNOSIS — I2510 Atherosclerotic heart disease of native coronary artery without angina pectoris: Secondary | ICD-10-CM | POA: Diagnosis not present

## 2015-03-19 DIAGNOSIS — Z87891 Personal history of nicotine dependence: Secondary | ICD-10-CM | POA: Insufficient documentation

## 2015-03-19 DIAGNOSIS — I252 Old myocardial infarction: Secondary | ICD-10-CM | POA: Insufficient documentation

## 2015-03-19 DIAGNOSIS — Z791 Long term (current) use of non-steroidal anti-inflammatories (NSAID): Secondary | ICD-10-CM | POA: Diagnosis not present

## 2015-03-19 DIAGNOSIS — R3915 Urgency of urination: Secondary | ICD-10-CM | POA: Insufficient documentation

## 2015-03-19 DIAGNOSIS — N401 Enlarged prostate with lower urinary tract symptoms: Secondary | ICD-10-CM | POA: Diagnosis not present

## 2015-03-19 DIAGNOSIS — Z87442 Personal history of urinary calculi: Secondary | ICD-10-CM | POA: Insufficient documentation

## 2015-03-19 DIAGNOSIS — Z951 Presence of aortocoronary bypass graft: Secondary | ICD-10-CM | POA: Insufficient documentation

## 2015-03-19 DIAGNOSIS — R351 Nocturia: Secondary | ICD-10-CM | POA: Insufficient documentation

## 2015-03-19 DIAGNOSIS — K759 Inflammatory liver disease, unspecified: Secondary | ICD-10-CM | POA: Diagnosis not present

## 2015-03-19 DIAGNOSIS — M109 Gout, unspecified: Secondary | ICD-10-CM | POA: Diagnosis not present

## 2015-03-19 DIAGNOSIS — I10 Essential (primary) hypertension: Secondary | ICD-10-CM | POA: Diagnosis not present

## 2015-03-19 HISTORY — PX: CYSTOSCOPY WITH INSERTION OF UROLIFT: SHX6678

## 2015-03-19 SURGERY — CYSTOSCOPY WITH INSERTION OF UROLIFT
Anesthesia: General

## 2015-03-19 MED ORDER — FENTANYL CITRATE (PF) 100 MCG/2ML IJ SOLN
INTRAMUSCULAR | Status: AC
  Filled 2015-03-19: qty 4

## 2015-03-19 MED ORDER — TRAMADOL-ACETAMINOPHEN 37.5-325 MG PO TABS: 1.0000 | ORAL_TABLET | Freq: Four times a day (QID) | ORAL | Status: DC | PRN

## 2015-03-19 MED ORDER — ONDANSETRON HCL 4 MG/2ML IJ SOLN
INTRAMUSCULAR | Status: AC
  Filled 2015-03-19: qty 2

## 2015-03-19 MED ORDER — PHENYLEPHRINE 40 MCG/ML (10ML) SYRINGE FOR IV PUSH (FOR BLOOD PRESSURE SUPPORT)
PREFILLED_SYRINGE | INTRAVENOUS | Status: AC
  Filled 2015-03-19: qty 10

## 2015-03-19 MED ORDER — CEFAZOLIN SODIUM-DEXTROSE 2-3 GM-% IV SOLR
2.0000 g | INTRAVENOUS | Status: AC
  Administered 2015-03-19: 2 g via INTRAVENOUS

## 2015-03-19 MED ORDER — FENTANYL CITRATE (PF) 100 MCG/2ML IJ SOLN
INTRAMUSCULAR | Status: DC | PRN
  Administered 2015-03-19 (×2): 25 ug via INTRAVENOUS

## 2015-03-19 MED ORDER — LIDOCAINE HCL (CARDIAC) 20 MG/ML IV SOLN
INTRAVENOUS | Status: DC | PRN
  Administered 2015-03-19: 50 mg via INTRAVENOUS

## 2015-03-19 MED ORDER — EPHEDRINE SULFATE 50 MG/ML IJ SOLN
INTRAMUSCULAR | Status: AC
  Filled 2015-03-19: qty 1

## 2015-03-19 MED ORDER — EPHEDRINE SULFATE 50 MG/ML IJ SOLN
INTRAMUSCULAR | Status: DC | PRN
  Administered 2015-03-19 (×3): 5 mg via INTRAVENOUS

## 2015-03-19 MED ORDER — FENTANYL CITRATE (PF) 100 MCG/2ML IJ SOLN
INTRAMUSCULAR | Status: AC
  Filled 2015-03-19: qty 2

## 2015-03-19 MED ORDER — TRAMADOL HCL 50 MG PO TABS
50.0000 mg | ORAL_TABLET | Freq: Four times a day (QID) | ORAL | Status: DC | PRN
  Administered 2015-03-19: 50 mg via ORAL
  Filled 2015-03-19: qty 1

## 2015-03-19 MED ORDER — PROPOFOL 10 MG/ML IV BOLUS
INTRAVENOUS | Status: AC
  Filled 2015-03-19: qty 20

## 2015-03-19 MED ORDER — CEFAZOLIN SODIUM-DEXTROSE 2-3 GM-% IV SOLR
INTRAVENOUS | Status: AC
  Filled 2015-03-19: qty 50

## 2015-03-19 MED ORDER — SODIUM CHLORIDE 0.9 % IJ SOLN
INTRAMUSCULAR | Status: AC
  Filled 2015-03-19: qty 10

## 2015-03-19 MED ORDER — FENTANYL CITRATE (PF) 100 MCG/2ML IJ SOLN
25.0000 ug | INTRAMUSCULAR | Status: DC | PRN
  Administered 2015-03-19: 25 ug via INTRAVENOUS

## 2015-03-19 MED ORDER — ONDANSETRON HCL 4 MG/2ML IJ SOLN
INTRAMUSCULAR | Status: DC | PRN
  Administered 2015-03-19: 4 mg via INTRAVENOUS

## 2015-03-19 MED ORDER — BELLADONNA ALKALOIDS-OPIUM 16.2-60 MG RE SUPP
RECTAL | Status: AC
  Filled 2015-03-19: qty 1

## 2015-03-19 MED ORDER — PHENYLEPHRINE HCL 10 MG/ML IJ SOLN
INTRAMUSCULAR | Status: DC | PRN
  Administered 2015-03-19: 40 ug via INTRAVENOUS

## 2015-03-19 MED ORDER — STERILE WATER FOR IRRIGATION IR SOLN
Status: DC | PRN
  Administered 2015-03-19: 6000 mL

## 2015-03-19 MED ORDER — LIDOCAINE HCL (CARDIAC) 20 MG/ML IV SOLN
INTRAVENOUS | Status: AC
  Filled 2015-03-19: qty 5

## 2015-03-19 MED ORDER — BELLADONNA ALKALOIDS-OPIUM 16.2-60 MG RE SUPP
RECTAL | Status: DC | PRN
  Administered 2015-03-19: 1 via RECTAL

## 2015-03-19 MED ORDER — LACTATED RINGERS IV SOLN
INTRAVENOUS | Status: DC
Start: 2015-03-19 — End: 2015-03-19
  Administered 2015-03-19: 10:00:00 via INTRAVENOUS
  Administered 2015-03-19: 1000 mL via INTRAVENOUS

## 2015-03-19 MED ORDER — PHENAZOPYRIDINE HCL 200 MG PO TABS
200.0000 mg | ORAL_TABLET | Freq: Three times a day (TID) | ORAL | Status: DC | PRN
Start: 2015-03-19 — End: 2016-01-14

## 2015-03-19 MED ORDER — CEPHALEXIN 500 MG PO CAPS: 500.0000 mg | ORAL_CAPSULE | Freq: Two times a day (BID) | ORAL | Status: DC

## 2015-03-19 MED ORDER — PROPOFOL 10 MG/ML IV BOLUS
INTRAVENOUS | Status: DC | PRN
  Administered 2015-03-19: 150 mg via INTRAVENOUS
  Administered 2015-03-19: 20 mg via INTRAVENOUS

## 2015-03-19 SURGICAL SUPPLY — 11 items
BAG URINE DRAINAGE (UROLOGICAL SUPPLIES) ×2 IMPLANT
BAG URO CATCHER STRL LF (DRAPE) ×3 IMPLANT
CATH FOLEY 2WAY SLVR  5CC 16FR (CATHETERS) ×2
CATH FOLEY 2WAY SLVR 5CC 16FR (CATHETERS) IMPLANT
GLOVE BIOGEL M STRL SZ7.5 (GLOVE) ×3 IMPLANT
GOWN STRL REUS W/TWL XL LVL3 (GOWN DISPOSABLE) ×6 IMPLANT
MANIFOLD NEPTUNE II (INSTRUMENTS) ×3 IMPLANT
PACK CYSTO (CUSTOM PROCEDURE TRAY) ×3 IMPLANT
SYSTEM UROLIFT (Male Continence) ×8 IMPLANT
TUBING CONNECTING 10 (TUBING) ×2 IMPLANT
TUBING CONNECTING 10' (TUBING) ×1

## 2015-03-19 NOTE — Progress Notes (Addendum)
Instructed patient and wife how to remove foley and care of foley cath and drainage bag. After getting up and dressed c/o epigastric pain.  2-3/10 according to patient. Wife distressed about patient having pain. Requested another pain pill. Pain med written for every 6 hours. Dr. Gaynelle Arabian paged. Offered patient ginger ale. He burped a few times and stated he feels better and wanted to go on home. Patient taken to car by RN.

## 2015-03-19 NOTE — Discharge Instructions (Signed)
Post transurethral resection of the prostate (TURP) instructions  Your recent prostate surgery requires very special post hospital care. Despite the fact that no skin incisions were used the area around the prostate incision is quite raw and is covered with a scab to promote healing and prevent bleeding. Certain cautions are needed to assure that the scab is not disturbed of the next 2-3 weeks while the healing proceeds.  Because the raw surface in your prostate and the irritating effects of urine you may expect frequency of urination and/or urgency (a stronger desire to urinate) and perhaps even getting up at night more often. This will usually resolve or improve slowly over the healing period. You may see some blood in your urine over the first 6 weeks. Do not be alarmed, even if the urine was clear for a while. Get off your feet and drink lots of fluids until clearing occurs. If you start to pass clots or don't improve call us.  Diet:  You may return to your normal diet immediately. Because of the raw surface of your bladder, alcohol, spicy foods, foods high in acid and drinks with caffeine may cause irritation or frequency and should be used in moderation. To keep your urine flowing freely and avoid constipation, drink plenty of fluids during the day (8-10 glasses). Tip: Avoid cranberry juice because it is very acidic.  Activity:  Your physical activity doesn't need to be restricted. However, if you are very active, you may see some blood in the urine. We suggest that you reduce your activity under the circumstances until the bleeding has stopped.  Bowels:  It is important to keep your bowels regular during the postoperative period. Straining with bowel movements can cause bleeding. A bowel movement every other day is reasonable. Use a mild laxative if needed, such as milk of magnesia 2-3 tablespoons, or 2 Dulcolax tablets. Call if you continue to have problems. If you had been taking narcotics  for pain, before, during or after your surgery, you may be constipated. Take a laxative if necessary.  Medication:  You should resume your pre-surgery medications unless told not to. In addition you may be given an antibiotic to prevent or treat infection. Antibiotics are not always necessary. All medication should be taken as prescribed until the bottles are finished unless you are having an unusual reaction to one of the drugs.     Problems you should report to [place]:  a. Fever greater than 101F. b. Heavy bleeding, or clots (see notes above about blood in urine). c. Inability to urinate. d. Drug reactions (hives, rash, nausea, vomiting, diarrhea). e. Severe burning or pain with urination that is not improving.  OK to STOP TAMSULOSIN when voiding well.       General Anesthesia, Adult, Care After Refer to this sheet in the next few weeks. These instructions provide you with information on caring for yourself after your procedure. Your health care provider may also give you more specific instructions. Your treatment has been planned according to current medical practices, but problems sometimes occur. Call your health care provider if you have any problems or questions after your procedure. WHAT TO EXPECT AFTER THE PROCEDURE After the procedure, it is typical to experience:  Sleepiness.  Nausea and vomiting. HOME CARE INSTRUCTIONS  For the first 24 hours after general anesthesia:  Have a responsible person with you.  Do not drive a car. If you are alone, do not take public transportation.  Do not drink alcohol.  Do  not take medicine that has not been prescribed by your health care provider.  Do not sign important papers or make important decisions.  You may resume a normal diet and activities as directed by your health care provider.  Change bandages (dressings) as directed.  If you have questions or problems that seem related to general anesthesia, call the hospital and  ask for the anesthetist or anesthesiologist on call. SEEK MEDICAL CARE IF:  You have nausea and vomiting that continue the day after anesthesia.  You develop a rash. SEEK IMMEDIATE MEDICAL CARE IF:   You have difficulty breathing.  You have chest pain.  You have any allergic problems.   This information is not intended to replace advice given to you by your health care provider. Make sure you discuss any questions you have with your health care provider.   Document Released: 08/07/2000 Document Revised: 05/22/2014 Document Reviewed: 08/30/2011 Elsevier Interactive Patient Education [employer].

## 2015-03-19 NOTE — Transfer of Care (Signed)
Immediate Anesthesia Transfer of Care Note  Patient: Patrick [name]  Procedure(s) Performed: Procedure(s): CYSTOSCOPY WITH INSERTION OF FOUR UROLIFTS (N/A)  Patient Location: PACU  Anesthesia Type:General  Level of Consciousness:  sedated, patient cooperative and responds to stimulation  Airway & Oxygen Therapy:Patient Spontanous Breathing and Patient connected to face mask oxgen  Post-op Assessment:  Report given to PACU RN and Post -op Vital signs reviewed and stable  Post vital signs:  Reviewed and stable  Last Vitals:  Filed Vitals:   03/19/15 0955  BP: 151/68  Pulse: 81  Temp: 36.4 C  Resp: 15    Complications: No apparent anesthesia complications

## 2015-03-19 NOTE — Anesthesia Procedure Notes (Signed)
Procedure Name: LMA Insertion Date/Time: 03/19/2015 9:11 AM Performed by: Anne Fu Pre-anesthesia Checklist: Patient identified, Emergency Drugs available, Suction available, Patient being monitored and Timeout performed Patient Re-evaluated:Patient Re-evaluated prior to inductionOxygen Delivery Method: Circle system utilized Preoxygenation: Pre-oxygenation with 100% oxygen Intubation Type: IV induction Ventilation: Mask ventilation without difficulty LMA: LMA inserted LMA Size: 4.0 Number of attempts: 1 Placement Confirmation: positive ETCO2 and breath sounds checked- equal and bilateral Tube secured with: Tape

## 2015-03-19 NOTE — H&P (Signed)
Reason For Visit Cystoscopy, flowrate, PVR and prostate u/s   Active Problems Problems  1. Benign prostatic hyperplasia with urinary obstruction (N40.1,N13.8) 2. Erectile dysfunction due to arterial insufficiency (N52.01) 3. Osteopenia (M85.80) 4. Urinary frequency (R35.0) 5. Vitamin D deficiency (E55.9)  History of Present Illness 81 yo male returns today for a cystoscopy, flowrate, PVR & prostate u/s because of BPH. Only occasional drip. IPSS 10 with weak stream. He has urinary urge, with voiding within 2 hrs, and occasional urge. We have discussed the anatomy of the prostate, and the evolution of treatments for BPH, including alpha blockers, and possible complications of medications, including dizziness, and retrograde ejaculation. In addition , we have discussed surgical options developed overt the years for BPH, and complications of these, including retrograde ejaculation and incontinence and ED.      We have discussed the recent development of urolift, and he may be a candidate for this, if his anatomy sets uop for it.He should have uroflo, cysto, PUS for size, and pvr by u/s, just to see if he would be a candidate for this.    Marland Kitchen Hx of BPH, ED, Osteopenia, and Vit D deficiency. Note also has a hx of history of kidney stones.    Currently on Tamsulosin and off Cialis. Cialis caused severe side effects, and does not work. He has failed Viagra. Staxyn not working. Currently has Levitra Rx but says they are all expensive.   We have discussed injection therapy also.   Past Medical History Problems  1. History of Coronary Artery Disease 2. History of Gout (M10.9) 3. History of hepatitis (Z86.19)  Surgical History Problems  1. History of Appendectomy 2. History of CABG (CABG) 3. History of Hernia Repair  Current Meds 1. Alfuzosin HCl ER 10 MG Oral Tablet Extended Release 24 Hour; 1HS - TAKE ONE  TABLET BY MOUTH AT BEDTIME; Last [id number] Ordered 2. Allopurinol 300 MG Oral  Tablet;  Therapy: 15Sep2007 to Recorded 3. Aspirin 81 MG TABS;  Therapy: (Recorded:13Feb2008) to Recorded 4. Bystolic 5 MG Oral Tablet;  Therapy: 21Feb2013 to Recorded 5. Celecoxib 200 MG Oral Capsule;  Therapy: ([id number]) to Recorded 6. Pantoprazole Sodium 40 MG Oral Tablet Delayed Release;  Therapy: (Recorded:02Dec2013) to Recorded 7. Prostaglandin E1 20ug/mL; Test dose. 10mg/5ml;  Therapy: [id number] (Last Rx:26Jul2016) Ordered 8. Synthroid 112 MCG Oral Tablet;  Therapy: 15Sep2007 to Recorded 9. Tamsulosin HCl - 0.4 MG Oral Capsule; Take 1 by mouth daily;  Therapy: [id number] (Evaluate:26Mar2017)  Requested for: 29Jun2016; Last  Rx:29Jun2016 Ordered  Allergies Medication  1. Halcion TABS  Family History Problems  1. Family history of Family Health Status Number Of Children   2 sons2 daughters 2. No pertinent family history : Mother  Social History Problems  1. Caffeine Use   2-3 per day 2. Former smoker [phone] 3. Marital History - Single 4. History of Tobacco Use   1 pack per day for 5 yearsquit for 47 years  Review of Systems Genitourinary, constitutional, skin, eye, otolaryngeal, hematologic/lymphatic, cardiovascular, pulmonary, endocrine, musculoskeletal, gastrointestinal, neurological and psychiatric system(s) were reviewed and pertinent findings if present are noted and are otherwise negative.  Genitourinary: urinary frequency, nocturia, weak urinary stream and urinary stream starts and stops, but no feelings of urinary urgency, no dysuria, no incontinence, no difficulty starting the urinary stream, no incomplete emptying of bladder, no post-void dribbling and initiating urination does not require straining.  Gastrointestinal: no nausea, no vomiting, no heartburn, no diarrhea and no constipation.  Constitutional: feeling tired (fatigue),  but no fever, no night sweats and no recent weight loss.  Integumentary: no new skin rashes or lesions and no  pruritus.  Eyes: no blurred vision and no diplopia.  ENT: sinus problems, but no sore throat.  Hematologic/Lymphatic: a tendency to easily bruise, but no swollen glands.  Cardiovascular: no chest pain and no leg swelling.  Respiratory: no shortness of breath and no cough.  Endocrine: no polydipsia.  Musculoskeletal: no back pain and no joint pain.  Neurological: no headache and no dizziness.  Psychiatric: no anxiety and no depression.    Vitals Vital Signs [Data Includes: Last 1 Day]  Recorded: 28Sep2016 11:06AM  Blood Pressure: 162 / 77 Temperature: 97 F Heart Rate: 69  Physical Exam Constitutional: Well nourished and well developed . No acute distress.  ENT:. The ears and nose are normal in appearance.  Neck: The appearance of the neck is normal and no neck mass is present.  Pulmonary: No respiratory distress and normal respiratory rhythm and effort.  Cardiovascular: Heart rate and rhythm are normal . No peripheral edema.  Abdomen: The abdomen is soft and nontender. No masses are palpated. No CVA tenderness. No hernias are palpable. No hepatosplenomegaly noted.  Rectal: Rectal exam demonstrates normal sphincter tone, no tenderness and no masses. Estimated prostate size is 3+. The prostate has no nodularity and is not tender. The left seminal vesicle is nonpalpable. The right seminal vesicle is nonpalpable. The perineum is normal on inspection.  Genitourinary: Examination of the penis demonstrates no discharge, no masses, no lesions and a normal meatus. The scrotum is without lesions. The right epididymis is palpably normal and non-tender. The left epididymis is palpably normal and non-tender. The right testis is non-tender and without masses. The left testis is non-tender and without masses.  Lymphatics: The femoral and inguinal nodes are not enlarged or tender.  Skin: Normal skin turgor, no visible rash and no visible skin lesions.  Neuro/Psych:. Mood and affect are appropriate.     Results/Data Selected Results  CREATININE with eGFR 26Jul2016 09:50AM Carolan Clines  SPECIMEN TYPE: BLOOD   Test Name Result Flag Reference  CREATININE 1.20 mg/dL  0.50-1.50  Est GFR, African American 63 mL/min  >=60  Est GFR, NonAfrican American 55 mL/min L >=60  THE ESTIMATED GFR IS A CALCULATION VALID FOR ADULTS (>=68 YEARS OLD) THAT USES THE CKD-EPI ALGORITHM TO ADJUST FOR AGE AND SEX. IT IS   NOT TO BE USED FOR CHILDREN, PREGNANT WOMEN, HOSPITALIZED PATIENTS,    PATIENTS ON DIALYSIS, OR WITH RAPIDLY CHANGING KIDNEY FUNCTION. ACCORDING TO THE NKDEP, EGFR >89 IS NORMAL, 60-89 SHOWS MILD IMPAIRMENT, 30-59 SHOWS MODERATE IMPAIRMENT, 15-29 SHOWS SEVERE IMPAIRMENT AND <15 IS ESRD.     FOOTNOTES:  (1) ** PLEASE NOTE CHANGE IN UNIT OF MEASURE AND REFERENCE RANGE(S). **   PSA REFLEX TO FREE 26Jul2016 09:50AM Carolan Clines  SPECIMEN TYPE: BLOOD   Test Name Result Flag Reference  PSA 4.15 ng/mL H <=4.00  TEST METHODOLOGY: ECLIA PSA (ELECTROCHEMILUMINESCENCE IMMUNOASSAY)  PSA, FREE 1.44 ng/mL    PSA, %FREE 35 %  > 25  PROBABILITY OF PROSTATE CANCER   (FOR MEN WITH NON-SUSPICIOUS DRE RESULTS AND PSA BETWEEN 4 AND   10 NG/ML, BY PATIENT AGE)     % FREE PSA                          PATIENT AGE  50 TO 59 YEARS  60 TO 69 YEARS  >70 YEARS    <=10%                  49.2%           57.5%          64.5%    11 - 18%               26.9%           33.9%          40.8%    19 - 25%               18.3%           23.9%          29.7%    >25%                    9.1%           12.2%          15.8%   Procedure prostate volume: 58cc. pvr=59cc. Flow rate: peak flow= 5cc/sec.   Procedure: Cystoscopy  Chaperone Present: laura.  Indication: Lower Urinary Tract Symptoms.  Informed Consent: Risks, benefits, and potential adverse events were discussed and informed consent was obtained from the patient.  Prep: The patient was prepped with betadine.  Anesthesia:.  Local anesthesia was administered intraurethrally with 2% lidocaine jelly.  Antibiotic prophylaxis: Ciprofloxacin.  Procedure Note:  Urethral meatus:. No abnormalities.  Anterior urethra: No abnormalities.  Prostatic urethra:. The lateral prostatic lobes were enlarged. No intravesical median lobe was visualized.  Bladder: Visulization was clear. A systematic survey of the bladder demonstrated no bladder tumors or stones. Examination of the bladder demonstrated trabeculation, but no clot within the bladder and no diverticulum cellules, but no fistula, no erythematous mucosa, no ulcer and no edema. The patient tolerated the procedure well.  Complications: None.    Assessment Pt is a good candidate for Urolift.   Plan Health Maintenance  1. UA With REFLEX; [Do Not Release]; Status:In Progress - Specimen/Data Collected;    Done: 28Sep2016  Urolift to tack the lateral lobes of the prostate. OP procedure.   Discussion/Summary cc: Dr. Marton Redwood     Signatures Electronically signed by : Carolan Clines, M.D.; Feb 10 2015 12:15PM EST  IPSS=10.

## 2015-03-19 NOTE — Interval H&P Note (Signed)
History and Physical Interval Note:  03/19/2015 9:46 AM  Patrick [name]  has presented today for surgery, with the diagnosis of bph  The various methods of treatment have been discussed with the patient and family. After consideration of risks, benefits and other options for treatment, the patient has consented to  Procedure(s): CYSTOSCOPY WITH INSERTION OF FOUR UROLIFT (N/A) as a surgical intervention .  The patient's history has been reviewed, patient examined, no change in status, stable for surgery.  I have reviewed the patient's chart and labs.  Questions were answered to the patient's satisfaction.     Lashawna Poche I Juston Goheen

## 2015-03-19 NOTE — Progress Notes (Addendum)
Patient arrives to short stay after urolift shifting around on stretcher. Vague compliants of GI discomfort and pain shifts around from upper abdomen to mid abdomen. Repositioned on stretcher. History of CABG. EKG done.Notified Dr Gaynelle Arabian and Dr Oletta Lamas of above.

## 2015-03-19 NOTE — Anesthesia Postprocedure Evaluation (Signed)
  Anesthesia Post-op Note  Patient: [place] H Betke  Procedure(s) Performed: Procedure(s): CYSTOSCOPY WITH INSERTION OF FOUR UROLIFTS (N/A)  Patient Location: PACU  Anesthesia Type:General  Level of Consciousness: awake  Airway and Oxygen Therapy: Patient Spontanous Breathing  Post-op Pain: mild  Post-op Assessment: Post-op Vital signs reviewed              Post-op Vital Signs: Reviewed  Last Vitals:  Filed Vitals:   03/19/15 1300  BP: 147/54  Pulse:   Temp:   Resp:     Complications: No apparent anesthesia complications

## 2015-03-19 NOTE — Anesthesia Preprocedure Evaluation (Addendum)
Anesthesia Evaluation  Patient identified by MRN, date of birth, ID band Patient awake    Reviewed: Allergy & Precautions, NPO status , Patient's Chart, lab work & pertinent test results  Airway Mallampati: II  TM Distance: >3 FB Neck ROM: Full    Dental   Pulmonary former smoker,    breath sounds clear to auscultation       Cardiovascular hypertension, + CAD and + Past MI   Rhythm:Regular Rate:Normal     Neuro/Psych    GI/Hepatic GERD  ,(+) Hepatitis -  Endo/Other  Hypothyroidism   Renal/GU      Musculoskeletal   Abdominal   Peds  Hematology   Anesthesia Other Findings   Reproductive/Obstetrics                           Anesthesia Physical Anesthesia Plan  ASA: III  Anesthesia Plan: General   Post-op Pain Management:    Induction: Intravenous  Airway Management Planned: LMA  Additional Equipment:   Intra-op Plan:   Post-operative Plan: Extubation in OR  Informed Consent: I have reviewed the patients History and Physical, chart, labs and discussed the procedure including the risks, benefits and alternatives for the proposed anesthesia with the patient or authorized representative who has indicated his/her understanding and acceptance.   Dental advisory given  Plan Discussed with: CRNA and Anesthesiologist  Anesthesia Plan Comments:        Anesthesia Quick Evaluation

## 2015-03-19 NOTE — Progress Notes (Signed)
C/O "feeling pressure", pointing to and rubbing diaphragm area. Abd soft and nondistended. BP 165/72 HR 63 NSR. Foley draining easily. Denies shortness of breath, restless, trying to get comfortable. Dr. Oletta Lamas aware. No new orders at this time.

## 2015-03-19 NOTE — Op Note (Signed)
Pre-operative diagnosis :   BPH  Postoperative diagnosis:Same  Operation:  Implantation of 4 Urolift implants  Surgeon:  S. Gaynelle Arabian, MD  First assistant:  None  Anesthesia:  General LMA  Preparation:  After appropriate preanesthesia, the patient was brought to the operative room, placed on the operating table in the dorsal supine position where general LMA anesthesia was introduced. He was then replaced in the dorsal lithotomy position with pubis was prepped with Betadine solution and draped in usual fashion. The arm and was double checked. The history was double checked.  Review history:  Active Problems Problems  1. Benign prostatic hyperplasia with urinary obstruction (N40.1,N13.8) 2. Erectile dysfunction due to arterial insufficiency (N52.01) 3. Osteopenia (M85.80) 4. Urinary frequency (R35.0) 5. Vitamin D deficiency (E55.9)  History of Present Illness 81 yo male returns today for a cystoscopy, flowrate, PVR & prostate u/s because of BPH. Only occasional drip. IPSS 10 with weak stream. He has urinary urge, with voiding within 2 hrs, and occasional urge. We have discussed the anatomy of the prostate, and the evolution of treatments for BPH, including alpha blockers, and possible complications of medications, including dizziness, and retrograde ejaculation. In addition , we have discussed surgical options developed overt the years for BPH, and complications of these, including retrograde ejaculation and incontinence and ED.   Statement of  Likelihood of Success: Excellent. TIME-OUT observed.:  Procedure:  Cystourethroscopy showed that the patient had bilobar BPH. There was no median lobe noted. 3+ trabeculation was noted, with cellule formation. There was no diverticular formation. There was no bladder stone or tumor formation noted.  The first set of your left implants were placed one 1/2 cm distal to the bladder neck at the 3:00 and 9:00 positions. Repeat cystoscopic examination  showed continued enlargement of the lateral lobes, and therefore, a second set of your left implants were placed by placing the your left implant device at the bladder neck, and withdrawing it to the level of the 0. The 0 was then tapped in standard fashion, raising the implant device to the 2:00 and 10:00 positions, and implanting devices bilaterally. Repeat cystoscopy revealed much improved passageway for urinary flow. Minimal bleeding was noted. I elected to proceed with catheter placement, and 37 French Foley catheter was easily placed into the bladder, without trauma. A 10 mL balloon was inflated and left to straight drainage for 24 hours.  The patient was awakened and taken to recovery room in good condition. He received IV Ancef during the procedure.

## 2015-03-20 ENCOUNTER — Emergency Department (HOSPITAL_COMMUNITY)
Admission: EM | Admit: 2015-03-20 | Discharge: 2015-03-20 | Disposition: A | Discharge status: Home / Self Care | Payer: Medicare Other | Attending: Emergency Medicine | Admitting: Emergency Medicine

## 2015-03-20 ENCOUNTER — Encounter (HOSPITAL_COMMUNITY): Payer: Self-pay | Admitting: Emergency Medicine

## 2015-03-20 DIAGNOSIS — E039 Hypothyroidism, unspecified: Secondary | ICD-10-CM | POA: Insufficient documentation

## 2015-03-20 DIAGNOSIS — Z951 Presence of aortocoronary bypass graft: Secondary | ICD-10-CM | POA: Diagnosis not present

## 2015-03-20 DIAGNOSIS — Z85828 Personal history of other malignant neoplasm of skin: Secondary | ICD-10-CM | POA: Insufficient documentation

## 2015-03-20 DIAGNOSIS — I10 Essential (primary) hypertension: Secondary | ICD-10-CM | POA: Insufficient documentation

## 2015-03-20 DIAGNOSIS — Z9889 Other specified postprocedural states: Secondary | ICD-10-CM | POA: Diagnosis not present

## 2015-03-20 DIAGNOSIS — N400 Enlarged prostate without lower urinary tract symptoms: Secondary | ICD-10-CM | POA: Insufficient documentation

## 2015-03-20 DIAGNOSIS — K219 Gastro-esophageal reflux disease without esophagitis: Secondary | ICD-10-CM | POA: Diagnosis not present

## 2015-03-20 DIAGNOSIS — Z87442 Personal history of urinary calculi: Secondary | ICD-10-CM | POA: Diagnosis not present

## 2015-03-20 DIAGNOSIS — R339 Retention of urine, unspecified: Secondary | ICD-10-CM | POA: Diagnosis not present

## 2015-03-20 DIAGNOSIS — Z79899 Other long term (current) drug therapy: Secondary | ICD-10-CM | POA: Diagnosis not present

## 2015-03-20 DIAGNOSIS — Z87891 Personal history of nicotine dependence: Secondary | ICD-10-CM | POA: Diagnosis not present

## 2015-03-20 DIAGNOSIS — M109 Gout, unspecified: Secondary | ICD-10-CM | POA: Insufficient documentation

## 2015-03-20 DIAGNOSIS — Z791 Long term (current) use of non-steroidal anti-inflammatories (NSAID): Secondary | ICD-10-CM | POA: Diagnosis not present

## 2015-03-20 DIAGNOSIS — Z7982 Long term (current) use of aspirin: Secondary | ICD-10-CM | POA: Insufficient documentation

## 2015-03-20 DIAGNOSIS — I252 Old myocardial infarction: Secondary | ICD-10-CM | POA: Diagnosis not present

## 2015-03-20 LAB — URINALYSIS, ROUTINE W REFLEX MICROSCOPIC
Glucose, UA: NEGATIVE mg/dL
KETONES UR: NEGATIVE mg/dL
NITRITE: POSITIVE — AB
PROTEIN: 30 mg/dL — AB
SPECIFIC GRAVITY, URINE: 1.005 (ref 1.005–1.030)
UROBILINOGEN UA: 1 mg/dL (ref 0.0–1.0)
pH: 6 (ref 5.0–8.0)

## 2015-03-20 LAB — URINE MICROSCOPIC-ADD ON

## 2015-03-20 NOTE — ED Notes (Signed)
EDP at pt bedside

## 2015-03-20 NOTE — ED Provider Notes (Addendum)
CSN: [id number]     Arrival date & time 03/20/15  1813 History   First MD Initiated Contact with Patient 03/20/15 1843     Chief Complaint  Patient presents with  . Urinary Retention     (Consider location/radiation/quality/duration/timing/severity/associated sxs/prior Treatment) HPI..... Status post urological procedure yesterday by Dr. Gaynelle Arabian.   Patient had 4 urolifts implanted for benign prostatic hypertrophy. Urinary catheter was removed at 11 AM today. Patient has been unable to void since that time.  No fever, sweats, chills, flank pain. He feels full in the suprapubic area.  Past Medical History  Diagnosis Date  . Hyperlipidemia   . GERD (gastroesophageal reflux disease)   . ED (erectile dysfunction)   . BPH (benign prostatic hypertrophy)   . Gout   . IHD (ischemic heart disease)     Remote CABG in 1997  . Hypothyroidism   . Hypertension   . MI, acute, non ST segment elevation ([place]) 2010    s/p cath with occluded SVG to PD that fills by collaterals and the remainder of his revasculsarization is satisfactory. "the first time they did the stent , the second time they did the graft 8 vessels"  . History of skin cancer   . Diverticulosis   . Hepatitis     hx hepatitis after mono as teenager  . Urinary frequency   . Nocturia   . History of kidney stones    Past Surgical History  Procedure Laterality Date  . Cardiac catheterization  09/08/2008    NORMAL. EF 60%; Occluded SVG to PD that fills by collaterals and the remainder of his revascularization is satisfactory. he is managed medically.   . Coronary artery bypass graft  1997    CABG x 8  . Appendectomy     Family History  Problem Relation Age of Onset  . Heart failure Mother 71   Social History  Substance Use Topics  . Smoking status: Former Smoker -- 1.00 packs/day for 15 years    Types: Cigarettes    Quit date: 05/15/1958  . Smokeless tobacco: None  . Alcohol Use: Yes     Comment: occasional beer     Review of Systems  All other systems reviewed and are negative.     Allergies  Halcion and Pravachol  Home Medications   Prior to Admission medications   Medication Sig Start Date End Date Taking? Authorizing Provider  allopurinol (ZYLOPRIM) 300 MG tablet Take 300 mg by mouth daily.    Historical Provider, MD  aspirin 81 MG tablet Take 81 mg by mouth daily.      Historical Provider, MD  celecoxib (CELEBREX) 200 MG capsule Take 200 mg by mouth daily.     Historical Provider, MD  cephALEXin (KEFLEX) 500 MG capsule Take 1 capsule (500 mg total) by mouth 2 (two) times daily. 03/19/15   Carolan Clines, MD  cyanocobalamin 1000 MCG tablet Take 100 mcg by mouth daily.    Historical Provider, MD  levothyroxine (SYNTHROID, LEVOTHROID) 112 MCG tablet Take 112 mcg by mouth daily before breakfast.    Historical Provider, MD  metoprolol succinate (TOPROL-XL) 25 MG 24 hr tablet Take 25 mg by mouth daily.    Historical Provider, MD  nitroGLYCERIN (NITROSTAT) 0.4 MG SL tablet Place 1 tablet (0.4 mg total) under the tongue every 5 (five) minutes as needed. 03/25/14   Burtis Junes, NP  pantoprazole (PROTONIX) 40 MG tablet Take 40 mg by mouth 2 (two) times daily.     Historical  Provider, MD  phenazopyridine (PYRIDIUM) 200 MG tablet Take 1 tablet (200 mg total) by mouth 3 (three) times daily as needed for pain. 03/19/15   Carolan Clines, MD  tamsulosin (FLOMAX) 0.4 MG CAPS capsule Take 0.4 mg by mouth daily.    Historical Provider, MD  traMADol-acetaminophen (ULTRACET) 37.5-325 MG tablet Take 1 tablet by mouth every 6 (six) hours as needed. 03/19/15   Carolan Clines, MD   BP 203/71 mmHg  Pulse 88  Temp(Src) 97.4 F (36.3 C) (Oral)  Resp 16  SpO2 95% Physical Exam  Constitutional: He is oriented to person, place, and time. He appears well-developed and well-nourished.  HENT:  Head: Normocephalic and atraumatic.  Eyes: Conjunctivae and EOM are normal. Pupils are equal, round, and  reactive to light.  Neck: Normal range of motion. Neck supple.  Cardiovascular: Normal rate and regular rhythm.   Pulmonary/Chest: Effort normal and breath sounds normal.  Abdominal:  Fullness in the suprapubic area  Musculoskeletal: Normal range of motion.  Neurological: He is alert and oriented to person, place, and time.  Skin: Skin is warm and dry.  Psychiatric: He has a normal mood and affect. His behavior is normal.  Nursing note and vitals reviewed.   ED Course  Procedures (including critical care time) Labs Review Labs Reviewed  URINALYSIS, ROUTINE W REFLEX MICROSCOPIC (NOT AT [facility])    Imaging Review No results found. I have personally reviewed and evaluated these images and lab results as part of my medical decision-making.   EKG Interpretation None      MDM   Final diagnoses:  Urinary retention    Foley catheter inserted. Large amount of urine retrieved. Will retain catheter over the weekend and reevaluate Monday morning at urology.    Nat Christen, MD 03/20/15 [place], MD 03/20/15 2031

## 2015-03-20 NOTE — ED Notes (Signed)
His wife is with him, and tells [place] pt. Underwent a precedure Dr. Gaynelle Arabian called a "urolift" which was explained to them as "opening the urethra to increase the urine flow".  Pt's. Wife states she removed his foley (specifically a #16 french) today per the instructions given by urologist.  Pt. Has been unable to void since catheter removed.

## 2015-03-20 NOTE — Discharge Instructions (Signed)
Urinalysis showed no serious issues. Keep foley catheter in until Monday morning and follow-up with [facility]

## 2015-03-20 NOTE — ED Notes (Signed)
Per pt urinary retention post removal of catheter at 1130 today; catheter use post urolift.

## 2015-03-22 ENCOUNTER — Other Ambulatory Visit: Payer: Self-pay | Admitting: Cardiovascular Disease

## 2015-03-22 NOTE — Telephone Encounter (Signed)
Ok to refill 

## 2015-03-23 DIAGNOSIS — R3915 Urgency of urination: Secondary | ICD-10-CM | POA: Diagnosis not present

## 2015-03-23 DIAGNOSIS — N401 Enlarged prostate with lower urinary tract symptoms: Secondary | ICD-10-CM | POA: Diagnosis not present

## 2015-03-25 DIAGNOSIS — Z85828 Personal history of other malignant neoplasm of skin: Secondary | ICD-10-CM | POA: Diagnosis not present

## 2015-03-25 DIAGNOSIS — C4442 Squamous cell carcinoma of skin of scalp and neck: Secondary | ICD-10-CM | POA: Diagnosis not present

## 2015-03-25 DIAGNOSIS — L570 Actinic keratosis: Secondary | ICD-10-CM | POA: Diagnosis not present

## 2015-03-25 DIAGNOSIS — D485 Neoplasm of uncertain behavior of skin: Secondary | ICD-10-CM | POA: Diagnosis not present

## 2015-03-25 DIAGNOSIS — C4441 Basal cell carcinoma of skin of scalp and neck: Secondary | ICD-10-CM | POA: Diagnosis not present

## 2015-03-26 DIAGNOSIS — R3915 Urgency of urination: Secondary | ICD-10-CM | POA: Diagnosis not present

## 2015-03-26 DIAGNOSIS — N401 Enlarged prostate with lower urinary tract symptoms: Secondary | ICD-10-CM | POA: Diagnosis not present

## 2015-03-31 ENCOUNTER — Other Ambulatory Visit: Payer: Self-pay | Admitting: Cardiovascular Disease

## 2015-03-31 NOTE — Telephone Encounter (Signed)
Ok to refill 

## 2015-04-02 ENCOUNTER — Encounter (HOSPITAL_COMMUNITY): Payer: Self-pay | Admitting: Urology

## 2015-04-07 DIAGNOSIS — R350 Frequency of micturition: Secondary | ICD-10-CM | POA: Diagnosis not present

## 2015-04-07 DIAGNOSIS — N401 Enlarged prostate with lower urinary tract symptoms: Secondary | ICD-10-CM | POA: Diagnosis not present

## 2015-04-13 DIAGNOSIS — N138 Other obstructive and reflux uropathy: Secondary | ICD-10-CM | POA: Diagnosis not present

## 2015-04-13 DIAGNOSIS — N401 Enlarged prostate with lower urinary tract symptoms: Secondary | ICD-10-CM | POA: Diagnosis not present

## 2015-04-15 DIAGNOSIS — L821 Other seborrheic keratosis: Secondary | ICD-10-CM | POA: Diagnosis not present

## 2015-04-15 DIAGNOSIS — L820 Inflamed seborrheic keratosis: Secondary | ICD-10-CM | POA: Diagnosis not present

## 2015-04-15 DIAGNOSIS — L570 Actinic keratosis: Secondary | ICD-10-CM | POA: Diagnosis not present

## 2015-04-15 DIAGNOSIS — Z85828 Personal history of other malignant neoplasm of skin: Secondary | ICD-10-CM | POA: Diagnosis not present

## 2015-07-07 DIAGNOSIS — N401 Enlarged prostate with lower urinary tract symptoms: Secondary | ICD-10-CM | POA: Diagnosis not present

## 2015-07-07 DIAGNOSIS — E559 Vitamin D deficiency, unspecified: Secondary | ICD-10-CM | POA: Diagnosis not present

## 2015-07-07 DIAGNOSIS — Z0000 Encounter for general adult medical examination without abnormal findings: Secondary | ICD-10-CM | POA: Diagnosis not present

## 2015-07-07 DIAGNOSIS — N5201 Erectile dysfunction due to arterial insufficiency: Secondary | ICD-10-CM | POA: Diagnosis not present

## 2015-07-07 DIAGNOSIS — R350 Frequency of micturition: Secondary | ICD-10-CM | POA: Diagnosis not present

## 2015-07-12 DIAGNOSIS — G3184 Mild cognitive impairment, so stated: Secondary | ICD-10-CM | POA: Diagnosis not present

## 2015-07-12 DIAGNOSIS — Z1389 Encounter for screening for other disorder: Secondary | ICD-10-CM | POA: Diagnosis not present

## 2015-07-12 DIAGNOSIS — E784 Other hyperlipidemia: Secondary | ICD-10-CM | POA: Diagnosis not present

## 2015-07-12 DIAGNOSIS — E538 Deficiency of other specified B group vitamins: Secondary | ICD-10-CM | POA: Diagnosis not present

## 2015-07-12 DIAGNOSIS — Z6824 Body mass index (BMI) 24.0-24.9, adult: Secondary | ICD-10-CM | POA: Diagnosis not present

## 2015-07-12 DIAGNOSIS — E038 Other specified hypothyroidism: Secondary | ICD-10-CM | POA: Diagnosis not present

## 2015-07-12 DIAGNOSIS — Z9861 Coronary angioplasty status: Secondary | ICD-10-CM | POA: Diagnosis not present

## 2015-07-12 DIAGNOSIS — I10 Essential (primary) hypertension: Secondary | ICD-10-CM | POA: Diagnosis not present

## 2015-08-09 DIAGNOSIS — E538 Deficiency of other specified B group vitamins: Secondary | ICD-10-CM | POA: Diagnosis not present

## 2015-08-09 DIAGNOSIS — I10 Essential (primary) hypertension: Secondary | ICD-10-CM | POA: Diagnosis not present

## 2015-08-09 DIAGNOSIS — G609 Hereditary and idiopathic neuropathy, unspecified: Secondary | ICD-10-CM | POA: Diagnosis not present

## 2015-09-09 DIAGNOSIS — E538 Deficiency of other specified B group vitamins: Secondary | ICD-10-CM | POA: Diagnosis not present

## 2015-10-13 DIAGNOSIS — E538 Deficiency of other specified B group vitamins: Secondary | ICD-10-CM | POA: Diagnosis not present

## 2015-10-18 DIAGNOSIS — Z85828 Personal history of other malignant neoplasm of skin: Secondary | ICD-10-CM | POA: Diagnosis not present

## 2015-10-18 DIAGNOSIS — B079 Viral wart, unspecified: Secondary | ICD-10-CM | POA: Diagnosis not present

## 2015-10-18 DIAGNOSIS — L570 Actinic keratosis: Secondary | ICD-10-CM | POA: Diagnosis not present

## 2015-10-18 DIAGNOSIS — D485 Neoplasm of uncertain behavior of skin: Secondary | ICD-10-CM | POA: Diagnosis not present

## 2015-10-26 DIAGNOSIS — R350 Frequency of micturition: Secondary | ICD-10-CM | POA: Diagnosis not present

## 2015-10-26 DIAGNOSIS — N401 Enlarged prostate with lower urinary tract symptoms: Secondary | ICD-10-CM | POA: Diagnosis not present

## 2015-11-10 DIAGNOSIS — D692 Other nonthrombocytopenic purpura: Secondary | ICD-10-CM | POA: Diagnosis not present

## 2015-11-10 DIAGNOSIS — R5383 Other fatigue: Secondary | ICD-10-CM | POA: Diagnosis not present

## 2015-11-10 DIAGNOSIS — Z6824 Body mass index (BMI) 24.0-24.9, adult: Secondary | ICD-10-CM | POA: Diagnosis not present

## 2015-11-10 DIAGNOSIS — G609 Hereditary and idiopathic neuropathy, unspecified: Secondary | ICD-10-CM | POA: Diagnosis not present

## 2015-11-10 DIAGNOSIS — E538 Deficiency of other specified B group vitamins: Secondary | ICD-10-CM | POA: Diagnosis not present

## 2015-12-14 DIAGNOSIS — E538 Deficiency of other specified B group vitamins: Secondary | ICD-10-CM | POA: Diagnosis not present

## 2016-01-11 DIAGNOSIS — E538 Deficiency of other specified B group vitamins: Secondary | ICD-10-CM | POA: Diagnosis not present

## 2016-01-14 ENCOUNTER — Ambulatory Visit (INDEPENDENT_AMBULATORY_CARE_PROVIDER_SITE_OTHER): Payer: Medicare Other | Admitting: Cardiovascular Disease

## 2016-01-14 ENCOUNTER — Encounter (INDEPENDENT_AMBULATORY_CARE_PROVIDER_SITE_OTHER): Payer: Self-pay

## 2016-01-14 ENCOUNTER — Encounter: Payer: Self-pay | Admitting: Cardiovascular Disease

## 2016-01-14 VITALS — BP 110/80 | HR 69 | Ht 66.0 in | Wt 180.2 lb

## 2016-01-14 DIAGNOSIS — R9431 Abnormal electrocardiogram [ECG] [EKG]: Secondary | ICD-10-CM

## 2016-01-14 DIAGNOSIS — I2510 Atherosclerotic heart disease of native coronary artery without angina pectoris: Secondary | ICD-10-CM

## 2016-01-14 DIAGNOSIS — I951 Orthostatic hypotension: Secondary | ICD-10-CM

## 2016-01-14 DIAGNOSIS — E785 Hyperlipidemia, unspecified: Secondary | ICD-10-CM | POA: Diagnosis not present

## 2016-01-14 MED ORDER — VALSARTAN 80 MG PO TABS: 80.0000 mg | ORAL_TABLET | Freq: Every day | ORAL | 11 refills | Status: DC

## 2016-01-14 NOTE — Patient Instructions (Signed)
Medication Instructions:  DECREASE Diovan to 80 mg once daily   Labwork: None Ordered   Testing/Procedures: Your physician has requested that you have a lexiscan myoview. For further information please visit [email or web address]. Please follow instruction sheet, as given.   Follow-Up: Your physician wants you to follow-up in: 6 months with Dr. Acie Fredrickson.  You will receive a reminder letter in the mail two months in advance. If you don't receive a letter, please call our office to schedule the follow-up appointment.   If you need a refill on your cardiac medications before your next appointment, please call your pharmacy.   Thank you for choosing [facility]! Christen Bame, RN [phone]

## 2016-01-14 NOTE — Progress Notes (Signed)
[place] H Dauphine Date of Birth  Feb 13, 1927 [place] [address]    [place]   [place], [place]  [address]    [place], [place]  [address] [phone]  Fax  [phone]  [phone]  Fax [phone]  Problems: 1. Coronary artery disease- status post CABG 1997.  2. Hyperlipidemia 3. Hypothyroidism   History of Present Illness:  Patrick [name] is an 82 yo with the above noted hx.  He exercises every day ( walks for an hour a day).  He complains of fatigue when doing yard work.  He's not had any cardiac complaints.  August 30, 2012:  He still complains of generalized fatigue.   He has been having fatigued for the past several office visits.  He is really frustrated with his fatigue.  Questions whether or not it is due to his medications.  Oct. 23, 2014:  Patrick [name] is doing well.  Exercising regularly - energy is ok.    August 27, 2013:  Patrick [name] is doing ok.  He walks occasionally but not regularly.  He knows that he needs to get back into it.   Fatigues easily.    Aug. 30, 2016: Patrick [name] is doing well. No CP  Has had some night sweats - has  occurred several time Does his yard work without problems   Sept. 1, 2017: Doing well from a cardiac standpoint Has more fatigue than he would like He has had an episode of orthostatic hypotension.  Has developed a peripheral neuropathy in his toes. Was started on Vit B12 .     Current Outpatient Prescriptions on File Prior to Visit  Medication Sig Dispense Refill  . allopurinol (ZYLOPRIM) 300 MG tablet Take 300 mg by mouth daily.    Marland Kitchen aspirin 81 MG tablet Take 81 mg by mouth daily.      . celecoxib (CELEBREX) 200 MG capsule Take 200 mg by mouth daily.     . cyanocobalamin 1000 MCG tablet Take 100 mcg by mouth daily.    Marland Kitchen levothyroxine (SYNTHROID, LEVOTHROID) 112 MCG tablet Take 112 mcg by mouth daily before breakfast.    . metoprolol succinate (TOPROL-XL) 25 MG 24 hr tablet Take 25 mg by mouth  daily.    . nitroGLYCERIN (NITROSTAT) 0.4 MG SL tablet Place 1 tablet (0.4 mg total) under the tongue every 5 (five) minutes as needed. 25 tablet 6  . pantoprazole (PROTONIX) 40 MG tablet Take 40 mg by mouth daily.     . tamsulosin (FLOMAX) 0.4 MG CAPS capsule Take 0.4 mg by mouth daily.     No current facility-administered medications on file prior to visit.     Allergies  Allergen Reactions  . Halcion [Triazolam]     "Makes me crazy"  . Pravachol     Unknown    Past Medical History:  Diagnosis Date  . BPH (benign prostatic hypertrophy)   . Diverticulosis   . ED (erectile dysfunction)   . GERD (gastroesophageal reflux disease)   . Gout   . Hepatitis    hx hepatitis after mono as teenager  . History of kidney stones   . History of skin cancer   . Hyperlipidemia   . Hypertension   . Hypothyroidism   . IHD (ischemic heart disease)    Remote CABG in 1997  . MI, acute, non ST segment elevation ([place]) 2010   s/p cath with occluded SVG to PD that fills by collaterals  and the remainder of his revasculsarization is satisfactory. "the first time they did the stent , the second time they did the graft 8 vessels"  . Nocturia   . Urinary frequency     Past Surgical History:  Procedure Laterality Date  . APPENDECTOMY    . CARDIAC CATHETERIZATION  09/08/2008   NORMAL. EF 60%; Occluded SVG to PD that fills by collaterals and the remainder of his revascularization is satisfactory. he is managed medically.   . CORONARY ARTERY BYPASS GRAFT  1997   CABG x 8  . CYSTOSCOPY WITH INSERTION OF UROLIFT N/A 03/19/2015   Procedure: CYSTOSCOPY WITH INSERTION OF FOUR UROLIFTS;  Surgeon: Carolan Clines, MD;  Location: [facility];  Service: Urology;  Laterality: N/A;    History  Smoking Status  . Former Smoker  . Packs/day: 1.00  . Years: 15.00  . Types: Cigarettes  . Quit date: 05/15/1958  Smokeless Tobacco  . Not on file    History  Alcohol Use  . Yes    Comment: occasional beer     Family History  Problem Relation Age of Onset  . Heart failure Mother 16    Reviw of Systems:  Reviewed in the HPI.  All other systems are negative.  Physical Exam: Blood pressure 110/80, pulse 69, height 5\' 6"  (1.676 m), weight 180 lb 3.2 oz (81.7 kg). General: Well developed, well nourished, in no acute distress.  Head: Normocephalic, atraumatic, sclera non-icteric, mucus membranes are moist,  Neck: Supple. Carotids are 2 + without bruits. No JVD Lungs: Clear bilaterally to auscultation. Heart: regular rate.  normal  S1 S2. No murmurs, gallops or rubs. Abdomen: Soft, non-tender, non-distended with normal bowel sounds. No hepatomegaly. No rebound/guarding. No masses. Msk:  Strength and tone are normal Extremities: No clubbing or cyanosis. No edema.  Distal pedal pulses are 2+ and equal bilaterally. Neuro: Alert and oriented X 3. Moves all extremities spontaneously. Psych:  Responds to questions appropriately with a normal affect.  ECG:  Sept. 1, 2017:   NSR at 69.    Previous Inf. MI.   Lateral Q waves -  The lateral mI changes are new since previous ECG    Assessment / Plan:   1. Coronary artery disease- status post CABG 1997. - He's not having any episodes of angina. Continue current medications. He has new changes that are consistent with a lateral wall myocardial infarction. Will get a [employer] Will see him in 6 months - sooner if needed.    2. Hyperlipidemia-  his lipids are followed by Dr. Brigitte Pulse. Continue current medications   Mertie Moores, MD  01/14/2016 10:46 AM    [place],  [place], [place]  [address] Pager [phone] Phone: [phone]; Fax: [phone]   [facility]  [address] [place], [place]  [address] [phone]   Fax [phone]

## 2016-01-19 DIAGNOSIS — E038 Other specified hypothyroidism: Secondary | ICD-10-CM | POA: Diagnosis not present

## 2016-01-19 DIAGNOSIS — E538 Deficiency of other specified B group vitamins: Secondary | ICD-10-CM | POA: Diagnosis not present

## 2016-01-19 DIAGNOSIS — M859 Disorder of bone density and structure, unspecified: Secondary | ICD-10-CM | POA: Diagnosis not present

## 2016-01-19 DIAGNOSIS — E784 Other hyperlipidemia: Secondary | ICD-10-CM | POA: Diagnosis not present

## 2016-01-19 DIAGNOSIS — R7301 Impaired fasting glucose: Secondary | ICD-10-CM | POA: Diagnosis not present

## 2016-01-19 DIAGNOSIS — Z125 Encounter for screening for malignant neoplasm of prostate: Secondary | ICD-10-CM | POA: Diagnosis not present

## 2016-01-19 DIAGNOSIS — I10 Essential (primary) hypertension: Secondary | ICD-10-CM | POA: Diagnosis not present

## 2016-01-19 DIAGNOSIS — M109 Gout, unspecified: Secondary | ICD-10-CM | POA: Diagnosis not present

## 2016-01-25 ENCOUNTER — Telehealth (HOSPITAL_COMMUNITY): Payer: Self-pay | Admitting: *Deleted

## 2016-01-25 NOTE — Telephone Encounter (Signed)
Patient given detailed instructions per Myocardial Perfusion Study Information Sheet for the test on 01/27/16. Patient notified to arrive 15 minutes early and that it is imperative to arrive on time for appointment to keep from having the test rescheduled.  If you need to cancel or reschedule your appointment, please call the office within 24 hours of your appointment. Failure to do so may result in a cancellation of your appointment, and a $50 no show fee. Patient verbalized understanding.  Iokepa Geffre J Nairi Oswald, RN  

## 2016-01-26 DIAGNOSIS — E784 Other hyperlipidemia: Secondary | ICD-10-CM | POA: Diagnosis not present

## 2016-01-26 DIAGNOSIS — Z23 Encounter for immunization: Secondary | ICD-10-CM | POA: Diagnosis not present

## 2016-01-26 DIAGNOSIS — Z0000 Encounter for general adult medical examination without abnormal findings: Secondary | ICD-10-CM | POA: Diagnosis not present

## 2016-01-26 DIAGNOSIS — E538 Deficiency of other specified B group vitamins: Secondary | ICD-10-CM | POA: Diagnosis not present

## 2016-01-26 DIAGNOSIS — Z9861 Coronary angioplasty status: Secondary | ICD-10-CM | POA: Diagnosis not present

## 2016-01-26 DIAGNOSIS — Z1389 Encounter for screening for other disorder: Secondary | ICD-10-CM | POA: Diagnosis not present

## 2016-01-26 DIAGNOSIS — I10 Essential (primary) hypertension: Secondary | ICD-10-CM | POA: Diagnosis not present

## 2016-01-26 DIAGNOSIS — Z6824 Body mass index (BMI) 24.0-24.9, adult: Secondary | ICD-10-CM | POA: Diagnosis not present

## 2016-01-26 DIAGNOSIS — Z7189 Other specified counseling: Secondary | ICD-10-CM | POA: Diagnosis not present

## 2016-01-26 DIAGNOSIS — E038 Other specified hypothyroidism: Secondary | ICD-10-CM | POA: Diagnosis not present

## 2016-01-26 DIAGNOSIS — Z955 Presence of coronary angioplasty implant and graft: Secondary | ICD-10-CM | POA: Diagnosis not present

## 2016-01-26 DIAGNOSIS — G609 Hereditary and idiopathic neuropathy, unspecified: Secondary | ICD-10-CM | POA: Diagnosis not present

## 2016-01-27 ENCOUNTER — Ambulatory Visit (HOSPITAL_COMMUNITY): Payer: Medicare Other | Attending: Cardiology

## 2016-01-27 DIAGNOSIS — R9431 Abnormal electrocardiogram [ECG] [EKG]: Secondary | ICD-10-CM | POA: Insufficient documentation

## 2016-01-27 LAB — MYOCARDIAL PERFUSION IMAGING
CSEPPHR: 85 {beats}/min
LV dias vol: 83 mL (ref 62–150)
LV sys vol: 29 mL
NUC STRESS TID: 1.01
RATE: 0.29
Rest HR: 62 {beats}/min
SDS: 3
SRS: 4
SSS: 7

## 2016-01-27 MED ORDER — TECHNETIUM TC 99M TETROFOSMIN IV KIT
32.4000 | PACK | Freq: Once | INTRAVENOUS | Status: AC | PRN
  Administered 2016-01-27: 32 via INTRAVENOUS
  Filled 2016-01-27: qty 32

## 2016-01-27 MED ORDER — REGADENOSON 0.4 MG/5ML IV SOLN
0.4000 mg | Freq: Once | INTRAVENOUS | Status: AC
  Administered 2016-01-27: 0.4 mg via INTRAVENOUS

## 2016-01-27 MED ORDER — TECHNETIUM TC 99M TETROFOSMIN IV KIT
10.9000 | PACK | Freq: Once | INTRAVENOUS | Status: AC | PRN
  Administered 2016-01-27: 10.9 via INTRAVENOUS
  Filled 2016-01-27: qty 11

## 2016-02-01 DIAGNOSIS — B351 Tinea unguium: Secondary | ICD-10-CM

## 2016-02-17 DIAGNOSIS — L570 Actinic keratosis: Secondary | ICD-10-CM | POA: Diagnosis not present

## 2016-02-17 DIAGNOSIS — Z85828 Personal history of other malignant neoplasm of skin: Secondary | ICD-10-CM | POA: Diagnosis not present

## 2016-02-17 DIAGNOSIS — L281 Prurigo nodularis: Secondary | ICD-10-CM | POA: Diagnosis not present

## 2016-04-04 ENCOUNTER — Encounter: Payer: Self-pay | Admitting: Cardiovascular Disease

## 2016-04-05 ENCOUNTER — Encounter: Payer: Self-pay | Admitting: Cardiovascular Disease

## 2016-04-17 ENCOUNTER — Ambulatory Visit: Payer: Medicare Other | Admitting: Cardiovascular Disease

## 2016-04-19 DIAGNOSIS — N5201 Erectile dysfunction due to arterial insufficiency: Secondary | ICD-10-CM | POA: Diagnosis not present

## 2016-04-19 DIAGNOSIS — N401 Enlarged prostate with lower urinary tract symptoms: Secondary | ICD-10-CM | POA: Diagnosis not present

## 2016-04-19 DIAGNOSIS — R350 Frequency of micturition: Secondary | ICD-10-CM | POA: Diagnosis not present

## 2016-04-19 DIAGNOSIS — F981 Encopresis not due to a substance or known physiological condition: Secondary | ICD-10-CM | POA: Diagnosis not present

## 2016-04-21 ENCOUNTER — Encounter (INDEPENDENT_AMBULATORY_CARE_PROVIDER_SITE_OTHER): Payer: Self-pay

## 2016-04-21 ENCOUNTER — Ambulatory Visit (INDEPENDENT_AMBULATORY_CARE_PROVIDER_SITE_OTHER): Payer: Medicare Other | Admitting: Cardiovascular Disease

## 2016-04-21 ENCOUNTER — Encounter: Payer: Self-pay | Admitting: Cardiovascular Disease

## 2016-04-21 VITALS — BP 132/70 | HR 64 | Ht 72.0 in | Wt 175.1 lb

## 2016-04-21 DIAGNOSIS — I2510 Atherosclerotic heart disease of native coronary artery without angina pectoris: Secondary | ICD-10-CM

## 2016-04-21 DIAGNOSIS — I739 Peripheral vascular disease, unspecified: Secondary | ICD-10-CM

## 2016-04-21 DIAGNOSIS — I25810 Atherosclerosis of coronary artery bypass graft(s) without angina pectoris: Secondary | ICD-10-CM

## 2016-04-21 NOTE — Progress Notes (Signed)
Patrick [name] Date of Birth  1927/02/05 [place] [address]    [place]   [place], [place]  [address]    [place], [place]  [address] [phone]  Fax  [phone]  [phone]  Fax [phone]  Problems: 1. Coronary artery disease- status post CABG 1997.  2. Hyperlipidemia 3. Hypothyroidism     Patrick [name] is an 82 yo with the above noted hx.  He exercises every day ( walks for an hour a day).  He complains of fatigue when doing yard work.  He's not had any cardiac complaints.  August 30, 2012:  He still complains of generalized fatigue.   He has been having fatigued for the past several office visits.  He is really frustrated with his fatigue.  Questions whether or not it is due to his medications.  Oct. 23, 2014:  Patrick [name] is doing well.  Exercising regularly - energy is ok.    August 27, 2013:  Patrick [name] is doing ok.  He walks occasionally but not regularly.  He knows that he needs to get back into it.   Fatigues easily.    Aug. 30, 2016: Patrick [name] is doing well. No CP  Has had some night sweats - has  occurred several time Does his yard work without problems   Sept. 1, 2017: Doing well from a cardiac standpoint Has more fatigued than he would like He has had an episode of orthostatic hypotension.  Has developed a peripheral neuropathy in his toes. Was started on Vit B12 .   Dec. 8, 2017:  Patrick [name] was seen with wife, Patrick [name].   More fatigue recently  Does not exercise anymore .    Current Outpatient Prescriptions on File Prior to Visit  Medication Sig Dispense Refill  . allopurinol (ZYLOPRIM) 300 MG tablet Take 300 mg by mouth daily.    Marland Kitchen aspirin 81 MG tablet Take 81 mg by mouth daily.      . celecoxib (CELEBREX) 200 MG capsule Take 200 mg by mouth daily.     . cyanocobalamin 1000 MCG tablet Take 100 mcg by mouth daily.    Marland Kitchen levothyroxine (SYNTHROID, LEVOTHROID) 112 MCG tablet Take 112 mcg by mouth daily before breakfast.     . metoprolol succinate (TOPROL-XL) 25 MG 24 hr tablet Take 25 mg by mouth daily.    . nitroGLYCERIN (NITROSTAT) 0.4 MG SL tablet Place 1 tablet (0.4 mg total) under the tongue every 5 (five) minutes as needed. 25 tablet 6  . pantoprazole (PROTONIX) 40 MG tablet Take 40 mg by mouth daily.     . tamsulosin (FLOMAX) 0.4 MG CAPS capsule Take 0.4 mg by mouth daily.    . valsartan (DIOVAN) 80 MG tablet Take 1 tablet (80 mg total) by mouth daily. 30 tablet 11   No current facility-administered medications on file prior to visit.     Allergies  Allergen Reactions  . Halcion [Triazolam]     "Makes me crazy"  . Pravachol     Unknown    Past Medical History:  Diagnosis Date  . BPH (benign prostatic hypertrophy)   . Diverticulosis   . ED (erectile dysfunction)   . GERD (gastroesophageal reflux disease)   . Gout   . Hepatitis    hx hepatitis after mono as teenager  . History of kidney stones   . History of skin cancer   . Hyperlipidemia   . Hypertension   .  Hypothyroidism   . IHD (ischemic heart disease)    Remote CABG in 1997  . MI, acute, non ST segment elevation ([place]) 2010   s/p cath with occluded SVG to PD that fills by collaterals and the remainder of his revasculsarization is satisfactory. "the first time they did the stent , the second time they did the graft 8 vessels"  . Nocturia   . Urinary frequency     Past Surgical History:  Procedure Laterality Date  . APPENDECTOMY    . CARDIAC CATHETERIZATION  09/08/2008   NORMAL. EF 60%; Occluded SVG to PD that fills by collaterals and the remainder of his revascularization is satisfactory. he is managed medically.   . CORONARY ARTERY BYPASS GRAFT  1997   CABG x 8  . CYSTOSCOPY WITH INSERTION OF UROLIFT N/A 03/19/2015   Procedure: CYSTOSCOPY WITH INSERTION OF FOUR UROLIFTS;  Surgeon: Carolan Clines, MD;  Location: [facility];  Service: Urology;  Laterality: N/A;    History  Smoking Status  . Former Smoker  . Packs/day: 1.00   . Years: 15.00  . Types: Cigarettes  . Quit date: 05/15/1958  Smokeless Tobacco  . Never Used    History  Alcohol Use  . Yes    Comment: occasional beer    Family History  Problem Relation Age of Onset  . Heart failure Mother 2    Reviw of Systems:  Reviewed in the HPI.  All other systems are negative.  Physical Exam: Blood pressure 132/70, pulse 64, height 6' (1.829 m), weight 175 lb 1.9 oz (79.4 kg). General: Well developed, well nourished, in no acute distress.  Head: Normocephalic, atraumatic, sclera non-icteric, mucus membranes are moist,  Neck: Supple. Carotids are 2 + without bruits. No JVD Lungs: Clear bilaterally to auscultation. Heart: regular rate.  normal  S1 S2. No murmurs, gallops or rubs. Abdomen: Soft, non-tender, non-distended with normal bowel sounds. No hepatomegaly. No rebound/guarding. No masses. Msk:  Strength and tone are normal Extremities: No clubbing or cyanosis. No edema.  Left foot posterior tibialis pulse is 1+ .   I was not able to feel his distal right foot pulses.  Toes are cyanotic.   Neuro: Alert and oriented X 3. Moves all extremities spontaneously. Psych:  Responds to questions appropriately with a normal affect.  ECG: NSR at 64.   Possible lateral MI , previous Inf. MI   Assessment / Plan:   1. Coronary artery disease- status post CABG 1997. - He's not having any episodes of angina But he does have significant fatigue whenever he does anything. His EKG shows a previous inferior and previous lateral wall myocardial infarction. We discussed the fact that his saphenous vein grafts are now 80 years old and he quite likely has occlusions of one or more of the grafts. He's not having any episodes of angina. We discussed doing a heart catheterization. We discussed the risks, benefits and options. We discussed the possible outcomes.  At this time he would like to wait and not have a cardiac catheterization. We will continue to have this  ongoing discussion. His creatinine is fairly normal so I do not think that he would be at significantly high risk for renal issues.  I'll see him again in 6 months.  2. Hyperlipidemia-  his lipids are followed by Dr. Brigitte Pulse. Continue current medications  3. Possible peripheral vascular disease. He complains of burning and tingling in his toes. These symptoms might be due to a peripheral neuropathy but it is difficult  to feel the pulses in his right foot. We will get bilateral lower extremity arterial Dopplers for further evaluation.  Mertie Moores, MD  04/21/2016 2:58 PM    [place],  [place], [place]  [address] Pager [phone] Phone: [phone]; Fax: [phone]   [facility]  [address] [place], [place]  [address] [phone]   Fax [phone]

## 2016-04-21 NOTE — Patient Instructions (Signed)
Medication Instructions:  Your physician recommends that you continue on your current medications as directed. Please refer to the Current Medication list given to you today.   Labwork: none  Testing/Procedures: Your physician has requested that you have a lower extremity arterial duplex. This test is an ultrasound of the arteries in the legs or arms. It looks at arterial blood flow in the legs and arms. Allow one hour for Lower and Upper Arterial scans. There are no restrictions or special instructions   Follow-Up: Your physician wants you to follow-up in: 6 months with Dr. Acie Fredrickson. You will receive a reminder letter in the mail two months in advance. If you don't receive a letter, please call our office to schedule the follow-up appointment.   Any Other Special Instructions Will Be Listed Below (If Applicable).     If you need a refill on your cardiac medications before your next appointment, please call your pharmacy.

## 2016-05-05 ENCOUNTER — Other Ambulatory Visit: Payer: Self-pay | Admitting: Cardiovascular Disease

## 2016-05-05 DIAGNOSIS — R0989 Other specified symptoms and signs involving the circulatory and respiratory systems: Secondary | ICD-10-CM

## 2016-05-16 ENCOUNTER — Ambulatory Visit (HOSPITAL_COMMUNITY)
Admission: RE | Admit: 2016-05-16 | Discharge: 2016-05-16 | Disposition: A | Discharge status: Home / Self Care | Payer: Medicare Other | Source: Ambulatory Visit | Attending: Cardiology | Admitting: Cardiology

## 2016-05-16 DIAGNOSIS — R9439 Abnormal result of other cardiovascular function study: Secondary | ICD-10-CM | POA: Diagnosis not present

## 2016-05-16 DIAGNOSIS — I739 Peripheral vascular disease, unspecified: Secondary | ICD-10-CM | POA: Insufficient documentation

## 2016-05-16 DIAGNOSIS — R0989 Other specified symptoms and signs involving the circulatory and respiratory systems: Secondary | ICD-10-CM

## 2016-05-19 ENCOUNTER — Telehealth: Payer: Self-pay | Admitting: Cardiovascular Disease

## 2016-05-19 NOTE — Telephone Encounter (Signed)
Patrick [name] is returning your call . Thanks

## 2016-05-19 NOTE — Telephone Encounter (Signed)
Reviewed results of LE arterial u/s with patient and forwarded results to Dr. Brigitte Pulse, PCP per patient's request. He thanked me for the call.

## 2016-05-23 DIAGNOSIS — C4442 Squamous cell carcinoma of skin of scalp and neck: Secondary | ICD-10-CM | POA: Diagnosis not present

## 2016-05-23 DIAGNOSIS — L570 Actinic keratosis: Secondary | ICD-10-CM | POA: Diagnosis not present

## 2016-05-23 DIAGNOSIS — Z85828 Personal history of other malignant neoplasm of skin: Secondary | ICD-10-CM | POA: Diagnosis not present

## 2016-05-23 DIAGNOSIS — D485 Neoplasm of uncertain behavior of skin: Secondary | ICD-10-CM | POA: Diagnosis not present

## 2016-06-02 DIAGNOSIS — R7301 Impaired fasting glucose: Secondary | ICD-10-CM | POA: Diagnosis not present

## 2016-06-02 DIAGNOSIS — G608 Other hereditary and idiopathic neuropathies: Secondary | ICD-10-CM | POA: Diagnosis not present

## 2016-06-02 DIAGNOSIS — E538 Deficiency of other specified B group vitamins: Secondary | ICD-10-CM | POA: Diagnosis not present

## 2016-06-02 DIAGNOSIS — Z6824 Body mass index (BMI) 24.0-24.9, adult: Secondary | ICD-10-CM | POA: Diagnosis not present

## 2016-07-04 DIAGNOSIS — D225 Melanocytic nevi of trunk: Secondary | ICD-10-CM | POA: Diagnosis not present

## 2016-07-04 DIAGNOSIS — Z85828 Personal history of other malignant neoplasm of skin: Secondary | ICD-10-CM | POA: Diagnosis not present

## 2016-07-04 DIAGNOSIS — L853 Xerosis cutis: Secondary | ICD-10-CM | POA: Diagnosis not present

## 2016-07-04 DIAGNOSIS — L821 Other seborrheic keratosis: Secondary | ICD-10-CM | POA: Diagnosis not present

## 2016-07-04 DIAGNOSIS — L570 Actinic keratosis: Secondary | ICD-10-CM | POA: Diagnosis not present

## 2016-08-01 DIAGNOSIS — D698 Other specified hemorrhagic conditions: Secondary | ICD-10-CM | POA: Diagnosis not present

## 2016-08-01 DIAGNOSIS — G608 Other hereditary and idiopathic neuropathies: Secondary | ICD-10-CM | POA: Diagnosis not present

## 2016-08-01 DIAGNOSIS — E784 Other hyperlipidemia: Secondary | ICD-10-CM | POA: Diagnosis not present

## 2016-08-01 DIAGNOSIS — Z9861 Coronary angioplasty status: Secondary | ICD-10-CM | POA: Diagnosis not present

## 2016-08-01 DIAGNOSIS — I10 Essential (primary) hypertension: Secondary | ICD-10-CM | POA: Diagnosis not present

## 2016-08-01 DIAGNOSIS — E538 Deficiency of other specified B group vitamins: Secondary | ICD-10-CM | POA: Diagnosis not present

## 2016-08-01 DIAGNOSIS — Z6824 Body mass index (BMI) 24.0-24.9, adult: Secondary | ICD-10-CM | POA: Diagnosis not present

## 2016-08-01 DIAGNOSIS — G3184 Mild cognitive impairment, so stated: Secondary | ICD-10-CM | POA: Diagnosis not present

## 2016-10-24 DIAGNOSIS — L570 Actinic keratosis: Secondary | ICD-10-CM | POA: Diagnosis not present

## 2016-10-24 DIAGNOSIS — Z85828 Personal history of other malignant neoplasm of skin: Secondary | ICD-10-CM | POA: Diagnosis not present

## 2016-10-24 DIAGNOSIS — D692 Other nonthrombocytopenic purpura: Secondary | ICD-10-CM | POA: Diagnosis not present

## 2016-10-24 DIAGNOSIS — L821 Other seborrheic keratosis: Secondary | ICD-10-CM | POA: Diagnosis not present

## 2016-12-28 ENCOUNTER — Ambulatory Visit (INDEPENDENT_AMBULATORY_CARE_PROVIDER_SITE_OTHER): Payer: Medicare Other | Admitting: Podiatry

## 2016-12-28 ENCOUNTER — Encounter: Payer: Self-pay | Admitting: Podiatry

## 2016-12-28 ENCOUNTER — Ambulatory Visit (INDEPENDENT_AMBULATORY_CARE_PROVIDER_SITE_OTHER): Payer: Medicare Other | Admitting: Cardiovascular Disease

## 2016-12-28 ENCOUNTER — Encounter: Payer: Self-pay | Admitting: Cardiovascular Disease

## 2016-12-28 VITALS — BP 138/60 | HR 68 | Ht 72.0 in | Wt 168.2 lb

## 2016-12-28 DIAGNOSIS — I2510 Atherosclerotic heart disease of native coronary artery without angina pectoris: Secondary | ICD-10-CM | POA: Diagnosis not present

## 2016-12-28 DIAGNOSIS — Z5181 Encounter for therapeutic drug level monitoring: Secondary | ICD-10-CM

## 2016-12-28 DIAGNOSIS — G629 Polyneuropathy, unspecified: Secondary | ICD-10-CM | POA: Diagnosis not present

## 2016-12-28 DIAGNOSIS — M779 Enthesopathy, unspecified: Secondary | ICD-10-CM | POA: Diagnosis not present

## 2016-12-28 MED ORDER — LOSARTAN POTASSIUM 50 MG PO TABS: 50.0000 mg | ORAL_TABLET | Freq: Every day | ORAL | 6 refills | Status: DC

## 2016-12-28 NOTE — Patient Instructions (Addendum)
Medication Instructions:    Your physician has recommended you make the following change in your medication:   Stop valsartan.  Start losartan 50 mg by mouth daily.  Continue all other medications the same.  Labwork:   Your physician recommends that you return for lab work in: 6 months just before your next visit to check your CMET.  Testing/Procedures:  NONE  Follow-Up:  Your physician recommends that you schedule a follow-up appointment in: 6 months. You will receive a reminder letter in the mail in about 4 months reminding you to call and schedule your appointment. If you don't receive this letter, please contact our office.  Any Other Special Instructions Will Be Listed Below (If Applicable).  If you need a refill on your cardiac medications before your next appointment, please call your pharmacy.

## 2016-12-28 NOTE — Progress Notes (Signed)
[place] Date of Birth  Apr 04, 1927 Patrick [name] [address]    [address]     [place], [place]  [address]         Problems: 1. Coronary artery disease- status post CABG 1997.  2. Hyperlipidemia 3. Hypothyroidism     Patrick [name] is an 83 yo with the above noted hx.  He exercises every day ( walks for an hour a day).  He complains of fatigue when doing yard work.  He's not had any cardiac complaints.  August 30, 2012:  He still complains of generalized fatigue.   He has been having fatigued for the past several office visits.  He is really frustrated with his fatigue.  Questions whether or not it is due to his medications.  Oct. 23, 2014:  Patrick [name] is doing well.  Exercising regularly - energy is ok.    August 27, 2013:  Patrick [name] is doing ok.  He walks occasionally but not regularly.  He knows that he needs to get back into it.   Fatigues easily.    Aug. 30, 2016: Patrick [name] is doing well. No CP  Has had some night sweats - has  occurred several time Does his yard work without problems   Sept. 1, 2017: Doing well from a cardiac standpoint Has more fatigued than he would like He has had an episode of orthostatic hypotension.  Has developed a peripheral neuropathy in his toes. Was started on Vit B12 .   Dec. 8, 2017:  Patrick [name] was seen with wife, Patrick [name].   More fatigue recently  Does not exercise anymore.    Aug. 16. 2018  Patrick [name] is doing well  No CP or dyspnea.  Had an episode of generalized weakness after incercourse on one occasion   Current Outpatient Prescriptions on File Prior to Visit  Medication Sig Dispense Refill  . allopurinol (ZYLOPRIM) 300 MG tablet Take 300 mg by mouth daily.    Marland Kitchen aspirin 81 MG tablet Take 81 mg by mouth daily.      . celecoxib (CELEBREX) 200 MG capsule Take 200 mg by mouth daily.     . cyanocobalamin 1000 MCG tablet Take 100 mcg by mouth daily.    Marland Kitchen levothyroxine (SYNTHROID, LEVOTHROID) 112 MCG tablet Take 112 mcg by  mouth daily before breakfast.    . metoprolol succinate (TOPROL-XL) 25 MG 24 hr tablet Take 25 mg by mouth daily.    . nitroGLYCERIN (NITROSTAT) 0.4 MG SL tablet Place 1 tablet (0.4 mg total) under the tongue every 5 (five) minutes as needed. 25 tablet 6  . pantoprazole (PROTONIX) 40 MG tablet Take 40 mg by mouth daily.     . tamsulosin (FLOMAX) 0.4 MG CAPS capsule Take 0.4 mg by mouth daily.    . valsartan (DIOVAN) 80 MG tablet Take 1 tablet (80 mg total) by mouth daily. 30 tablet 11   No current facility-administered medications on file prior to visit.     Allergies  Allergen Reactions  . Halcion [Triazolam]     "Makes me crazy"  . Pravachol     Unknown    Past Medical History:  Diagnosis Date  . BPH (benign prostatic hypertrophy)   . Diverticulosis   . ED (erectile dysfunction)   . GERD (gastroesophageal reflux disease)   . Gout   . Hepatitis    hx hepatitis after mono as teenager  . History of kidney stones   . History  of skin cancer   . Hyperlipidemia   . Hypertension   . Hypothyroidism   . IHD (ischemic heart disease)    Remote CABG in 1997  . MI, acute, non ST segment elevation ([place]) 2010   s/p cath with occluded SVG to PD that fills by collaterals and the remainder of his revasculsarization is satisfactory. "the first time they did the stent , the second time they did the graft 8 vessels"  . Nocturia   . Urinary frequency     Past Surgical History:  Procedure Laterality Date  . APPENDECTOMY    . CARDIAC CATHETERIZATION  09/08/2008   NORMAL. EF 60%; Occluded SVG to PD that fills by collaterals and the remainder of his revascularization is satisfactory. he is managed medically.   . CORONARY ARTERY BYPASS GRAFT  1997   CABG x 8  . CYSTOSCOPY WITH INSERTION OF UROLIFT N/A 03/19/2015   Procedure: CYSTOSCOPY WITH INSERTION OF FOUR UROLIFTS;  Surgeon: Carolan Clines, MD;  Location: [facility];  Service: Urology;  Laterality: N/A;    History  Smoking Status  .  Former Smoker  . Packs/day: 1.00  . Years: 15.00  . Types: Cigarettes  . Quit date: 05/15/1958  Smokeless Tobacco  . Never Used    History  Alcohol Use  . Yes    Comment: occasional beer    Family History  Problem Relation Age of Onset  . Heart failure Mother 33    Reviw of Systems:  Reviewed in the HPI.  All other systems are negative.  Physical Exam: Blood pressure 138/60, pulse 68, height 6' (1.829 m), weight 168 lb 3.2 oz (76.3 kg), SpO2 98 %. General: Well developed, well nourished, in no acute distress.  Head: Normocephalic, atraumatic, sclera non-icteric, mucus membranes are moist,  Neck: Supple. Carotids are 2 + without bruits. No JVD Lungs: Clear bilaterally to auscultation. Heart: regular rate.  normal  S1 S2. No murmurs, gallops or rubs. Abdomen: Soft, non-tender, non-distended with normal bowel sounds. No hepatomegaly. No rebound/guarding. No masses. Msk:  Strength and tone are normal Extremities: No clubbing or cyanosis. No edema.  Left foot posterior tibialis pulse is 1+ .   I was not able to feel his distal right foot pulses.  Toes are cyanotic.   Neuro: Alert and oriented X 3. Moves all extremities spontaneously. Psych:  Responds to questions appropriately with a normal affect.  ECG: NSR at 64.   Possible lateral MI , previous Inf. MI , rare PVCs   Assessment / Plan:   1. Coronary artery disease- status post CABG 1997. - He's not having any episodes of angina But he does have significant fatigue whenever he does anything. His EKG shows a previous inferior and previous lateral wall myocardial infarction. We discussed the fact that his saphenous vein grafts are now 81 years old and he quite likely has occlusions of one or more of the grafts. He's not having any episodes of angina. We discussed doing a heart catheterization. We discussed the risks, benefits and options. We discussed the possible outcomes.  At this time he would like to wait and not have a  cardiac catheterization. We will continue to have this ongoing discussion. His creatinine is fairly normal so I do not think that he would be at significantly high risk for renal issues.  I'll see him again in 6 months.  2. Hyperlipidemia-  his lipids are followed by Dr. Brigitte Pulse. Continue current medications  3. Possible peripheral vascular disease. He complains  of burning and tingling in his toes. These symptoms might be due to a peripheral neuropathy but it is difficult to feel the pulses in his right foot. We will get bilateral lower extremity arterial Dopplers for further evaluation.  4.  Essential HTN:   Is on Valsartan ( on recall at this point) Will DC Valsarta and start Losartan 50 mg a day  Check CMP in 6 months    Mertie Moores, MD  12/28/2016 9:47 AM    [facility] [address],  [place], [place]  [address] Pager [phone] Phone: [phone]; Fax: [phone]

## 2016-12-28 NOTE — Progress Notes (Signed)
Subjective:    Patient ID: Patrick [name], male   DOB: 83 y.o.   MRN: [id number]   HPI patient presents stating he has a block feeling in the bottom of his feet and that they also feel numb and it's been going on for at least a year    Review of Systems  All other systems reviewed and are negative.       Objective:  Physical Exam  Constitutional: He is oriented to person, place, and time. He appears well-developed and well-nourished.  Cardiovascular: Intact distal pulses.   Musculoskeletal: Normal range of motion.  Neurological: He is alert and oriented to person, place, and time.  Skin: Skin is warm and dry.  Nursing note and vitals reviewed.  neurovascular status intact muscle strength was adequate range of motion within normal limits with patient noted to have mild diminishment of sharp Dole vibratory with reduced that pad underneath the metatarsal heads of both feet with mild inflammation associated     Assessment:    Low grade capsulitis with possibility for neuropathic condition that is probably idiopathic in nature     Plan:    H&P on conditions reviewed at great length. At this point I have recommended a very soft type orthotic to try to diffuse weight off the metatarsal heads along with new types of tennis shoes as Reeboks are not what I would consider good for him. He will be seen bipolar with this for evaluation and orthotic casting

## 2016-12-28 NOTE — Progress Notes (Signed)
   Subjective:    Patient ID: Patrick [name], male    DOB: 06/09/1926, 83 y.o.   MRN: [id number]  HPI  Chief Complaint  Patient presents with  . Peripheral Neuropathy    Both feet/ toes and top of feet       Review of Systems  All other systems reviewed and are negative.      Objective:   Physical Exam        Assessment & Plan:

## 2017-01-03 ENCOUNTER — Other Ambulatory Visit: Payer: Medicare Other | Admitting: Orthotics

## 2017-01-29 DIAGNOSIS — I10 Essential (primary) hypertension: Secondary | ICD-10-CM | POA: Diagnosis not present

## 2017-01-29 DIAGNOSIS — E784 Other hyperlipidemia: Secondary | ICD-10-CM | POA: Diagnosis not present

## 2017-01-29 DIAGNOSIS — E538 Deficiency of other specified B group vitamins: Secondary | ICD-10-CM | POA: Diagnosis not present

## 2017-01-29 DIAGNOSIS — M859 Disorder of bone density and structure, unspecified: Secondary | ICD-10-CM | POA: Diagnosis not present

## 2017-01-29 DIAGNOSIS — R7301 Impaired fasting glucose: Secondary | ICD-10-CM | POA: Diagnosis not present

## 2017-01-29 DIAGNOSIS — M109 Gout, unspecified: Secondary | ICD-10-CM | POA: Diagnosis not present

## 2017-01-29 DIAGNOSIS — E038 Other specified hypothyroidism: Secondary | ICD-10-CM | POA: Diagnosis not present

## 2017-02-05 DIAGNOSIS — D696 Thrombocytopenia, unspecified: Secondary | ICD-10-CM | POA: Diagnosis not present

## 2017-02-05 DIAGNOSIS — Z0000 Encounter for general adult medical examination without abnormal findings: Secondary | ICD-10-CM | POA: Diagnosis not present

## 2017-02-05 DIAGNOSIS — E038 Other specified hypothyroidism: Secondary | ICD-10-CM | POA: Diagnosis not present

## 2017-02-05 DIAGNOSIS — Z23 Encounter for immunization: Secondary | ICD-10-CM | POA: Diagnosis not present

## 2017-02-05 DIAGNOSIS — G609 Hereditary and idiopathic neuropathy, unspecified: Secondary | ICD-10-CM | POA: Diagnosis not present

## 2017-02-05 DIAGNOSIS — Z6823 Body mass index (BMI) 23.0-23.9, adult: Secondary | ICD-10-CM | POA: Diagnosis not present

## 2017-02-05 DIAGNOSIS — E538 Deficiency of other specified B group vitamins: Secondary | ICD-10-CM | POA: Diagnosis not present

## 2017-02-05 DIAGNOSIS — E784 Other hyperlipidemia: Secondary | ICD-10-CM | POA: Diagnosis not present

## 2017-02-05 DIAGNOSIS — Z1389 Encounter for screening for other disorder: Secondary | ICD-10-CM | POA: Diagnosis not present

## 2017-02-05 DIAGNOSIS — R7301 Impaired fasting glucose: Secondary | ICD-10-CM | POA: Diagnosis not present

## 2017-02-05 DIAGNOSIS — G3184 Mild cognitive impairment, so stated: Secondary | ICD-10-CM | POA: Diagnosis not present

## 2017-02-12 ENCOUNTER — Ambulatory Visit (HOSPITAL_COMMUNITY): Payer: Medicare Other | Admitting: Anesthesiology

## 2017-02-12 ENCOUNTER — Encounter (HOSPITAL_BASED_OUTPATIENT_CLINIC_OR_DEPARTMENT_OTHER): Payer: Self-pay | Admitting: Emergency Medicine

## 2017-02-12 ENCOUNTER — Encounter (HOSPITAL_COMMUNITY): Admission: EM | Disposition: A | Discharge status: Home / Self Care | Payer: Self-pay | Source: Home / Self Care

## 2017-02-12 ENCOUNTER — Ambulatory Visit (HOSPITAL_BASED_OUTPATIENT_CLINIC_OR_DEPARTMENT_OTHER)
Admission: EM | Admit: 2017-02-12 | Discharge: 2017-02-12 | Disposition: A | Discharge status: Home / Self Care | Payer: Medicare Other | Attending: Gastroenterology | Admitting: Gastroenterology

## 2017-02-12 ENCOUNTER — Emergency Department (HOSPITAL_BASED_OUTPATIENT_CLINIC_OR_DEPARTMENT_OTHER): Payer: Medicare Other

## 2017-02-12 DIAGNOSIS — Z87442 Personal history of urinary calculi: Secondary | ICD-10-CM | POA: Insufficient documentation

## 2017-02-12 DIAGNOSIS — I2510 Atherosclerotic heart disease of native coronary artery without angina pectoris: Secondary | ICD-10-CM | POA: Diagnosis not present

## 2017-02-12 DIAGNOSIS — Z7982 Long term (current) use of aspirin: Secondary | ICD-10-CM | POA: Diagnosis not present

## 2017-02-12 DIAGNOSIS — E785 Hyperlipidemia, unspecified: Secondary | ICD-10-CM | POA: Diagnosis not present

## 2017-02-12 DIAGNOSIS — X58XXXA Exposure to other specified factors, initial encounter: Secondary | ICD-10-CM | POA: Diagnosis not present

## 2017-02-12 DIAGNOSIS — E039 Hypothyroidism, unspecified: Secondary | ICD-10-CM | POA: Diagnosis not present

## 2017-02-12 DIAGNOSIS — Z79899 Other long term (current) drug therapy: Secondary | ICD-10-CM | POA: Insufficient documentation

## 2017-02-12 DIAGNOSIS — K2950 Unspecified chronic gastritis without bleeding: Secondary | ICD-10-CM | POA: Insufficient documentation

## 2017-02-12 DIAGNOSIS — N400 Enlarged prostate without lower urinary tract symptoms: Secondary | ICD-10-CM | POA: Diagnosis not present

## 2017-02-12 DIAGNOSIS — I10 Essential (primary) hypertension: Secondary | ICD-10-CM | POA: Diagnosis not present

## 2017-02-12 DIAGNOSIS — Z85828 Personal history of other malignant neoplasm of skin: Secondary | ICD-10-CM | POA: Diagnosis not present

## 2017-02-12 DIAGNOSIS — K449 Diaphragmatic hernia without obstruction or gangrene: Secondary | ICD-10-CM | POA: Insufficient documentation

## 2017-02-12 DIAGNOSIS — T18128A Food in esophagus causing other injury, initial encounter: Secondary | ICD-10-CM | POA: Diagnosis not present

## 2017-02-12 DIAGNOSIS — K222 Esophageal obstruction: Secondary | ICD-10-CM | POA: Diagnosis not present

## 2017-02-12 DIAGNOSIS — Z951 Presence of aortocoronary bypass graft: Secondary | ICD-10-CM | POA: Diagnosis not present

## 2017-02-12 DIAGNOSIS — R1314 Dysphagia, pharyngoesophageal phase: Secondary | ICD-10-CM | POA: Diagnosis not present

## 2017-02-12 DIAGNOSIS — I252 Old myocardial infarction: Secondary | ICD-10-CM | POA: Diagnosis not present

## 2017-02-12 DIAGNOSIS — K210 Gastro-esophageal reflux disease with esophagitis: Secondary | ICD-10-CM | POA: Insufficient documentation

## 2017-02-12 DIAGNOSIS — R0989 Other specified symptoms and signs involving the circulatory and respiratory systems: Secondary | ICD-10-CM | POA: Diagnosis not present

## 2017-02-12 DIAGNOSIS — Z87891 Personal history of nicotine dependence: Secondary | ICD-10-CM | POA: Insufficient documentation

## 2017-02-12 DIAGNOSIS — K2970 Gastritis, unspecified, without bleeding: Secondary | ICD-10-CM | POA: Diagnosis not present

## 2017-02-12 HISTORY — PX: ESOPHAGOGASTRODUODENOSCOPY (EGD) WITH PROPOFOL: SHX5813

## 2017-02-12 LAB — CBC WITH DIFFERENTIAL/PLATELET
BASOS ABS: 0.2 10*3/uL — AB (ref 0.0–0.1)
Basophils Relative: 4 %
EOS ABS: 0 10*3/uL (ref 0.0–0.7)
Eosinophils Relative: 0 %
HCT: 46 % (ref 39.0–52.0)
Hemoglobin: 15.7 g/dL (ref 13.0–17.0)
LYMPHS ABS: 1.3 10*3/uL (ref 0.7–4.0)
Lymphocytes Relative: 28 %
MCH: 32.8 pg (ref 26.0–34.0)
MCHC: 34.1 g/dL (ref 30.0–36.0)
MCV: 96 fL (ref 78.0–100.0)
MONO ABS: 0.9 10*3/uL (ref 0.1–1.0)
MONOS PCT: 21 %
NEUTROS ABS: 2.1 10*3/uL (ref 1.7–7.7)
Neutrophils Relative %: 47 %
Platelets: 136 10*3/uL — ABNORMAL LOW (ref 150–400)
RBC: 4.79 MIL/uL (ref 4.22–5.81)
RDW: 13 % (ref 11.5–15.5)
WBC: 4.5 10*3/uL (ref 4.0–10.5)

## 2017-02-12 LAB — BASIC METABOLIC PANEL
ANION GAP: 7 (ref 5–15)
BUN: 25 mg/dL — AB (ref 6–20)
CHLORIDE: 107 mmol/L (ref 101–111)
CO2: 24 mmol/L (ref 22–32)
Calcium: 9.3 mg/dL (ref 8.9–10.3)
Creatinine, Ser: 1.25 mg/dL — ABNORMAL HIGH (ref 0.61–1.24)
GFR calc Af Amer: 58 mL/min — ABNORMAL LOW (ref >60)
GFR, EST NON AFRICAN AMERICAN: 50 mL/min — AB (ref >60)
GLUCOSE: 108 mg/dL — AB (ref 65–99)
POTASSIUM: 4.3 mmol/L (ref 3.5–5.1)
Sodium: 138 mmol/L (ref 135–145)

## 2017-02-12 LAB — TROPONIN I: Troponin I: <0.03 ng/mL (ref <0.03)

## 2017-02-12 SURGERY — ESOPHAGOGASTRODUODENOSCOPY (EGD) WITH PROPOFOL
Anesthesia: Monitor Anesthesia Care

## 2017-02-12 MED ORDER — PROPOFOL 10 MG/ML IV BOLUS
INTRAVENOUS | Status: AC
  Filled 2017-02-12: qty 40

## 2017-02-12 MED ORDER — LACTATED RINGERS IV SOLN
INTRAVENOUS | Status: DC | PRN
  Administered 2017-02-12: 13:00:00 via INTRAVENOUS

## 2017-02-12 MED ORDER — PHENYLEPHRINE 40 MCG/ML (10ML) SYRINGE FOR IV PUSH (FOR BLOOD PRESSURE SUPPORT)
PREFILLED_SYRINGE | INTRAVENOUS | Status: AC
  Filled 2017-02-12: qty 20

## 2017-02-12 MED ORDER — PROPOFOL 500 MG/50ML IV EMUL
INTRAVENOUS | Status: DC | PRN
  Administered 2017-02-12: 60 mg via INTRAVENOUS

## 2017-02-12 MED ORDER — PROPOFOL 10 MG/ML IV BOLUS
INTRAVENOUS | Status: AC
  Filled 2017-02-12: qty 20

## 2017-02-12 MED ORDER — PHENYLEPHRINE HCL 10 MG/ML IJ SOLN
INTRAMUSCULAR | Status: DC | PRN
  Administered 2017-02-12 (×2): 120 ug via INTRAVENOUS
  Administered 2017-02-12: 200 ug via INTRAVENOUS
  Administered 2017-02-12 (×2): 120 ug via INTRAVENOUS

## 2017-02-12 MED ORDER — PROPOFOL 500 MG/50ML IV EMUL
INTRAVENOUS | Status: DC | PRN
  Administered 2017-02-12: 125 ug/kg/min via INTRAVENOUS

## 2017-02-12 MED ORDER — PHENYLEPHRINE 40 MCG/ML (10ML) SYRINGE FOR IV PUSH (FOR BLOOD PRESSURE SUPPORT)
PREFILLED_SYRINGE | INTRAVENOUS | Status: AC
  Filled 2017-02-12: qty 30

## 2017-02-12 MED ORDER — SODIUM CHLORIDE 0.9 % IV SOLN: INTRAVENOUS | Status: DC

## 2017-02-12 SURGICAL SUPPLY — 15 items

## 2017-02-12 NOTE — ED Triage Notes (Signed)
Pt with perceived object stuck in throat. He reports this discomfort x4 days, especially after he eats. Pt has hoarseness and feels the need to vomit.  A/O. Speaking full sentences, no drooling and denies sore throat.

## 2017-02-12 NOTE — Discharge Instructions (Signed)

## 2017-02-12 NOTE — ED Provider Notes (Addendum)
[facility] Provider Note   CSN: [id number] Arrival date & time: 02/12/17  0913     History   Chief Complaint Chief Complaint  Patient presents with  . Swallowed Foreign Body    HPI [place] [name] is a 83 y.o. male.  HPI Patient presents with around 4 days of throat tightness. States it. It stuck there. States his been happening for a while now but just happens once every month or 2 and then clears up. No real chest pain. Has had difficulty keeping liquids down. Yesterday he was able to eat some breakfast and some lunch but unable to eat any dinner. States he drank tea this morning and his come back up. States his voice is been a little different also. Has seen Dr. Cristina Gong in the past, but not for this. Saw his primary care doctor couple weeks ago for his annual exam and did not tell his doctor about this. Note blood in the stool. Did have previous CABG 20 years ago and had some throat tightness at that time. Patient states he normally feels better after he makes himself vomit but is himself, without any improvement. Past Medical History:  Diagnosis Date  . BPH (benign prostatic hypertrophy)   . Diverticulosis   . ED (erectile dysfunction)   . GERD (gastroesophageal reflux disease)   . Gout   . Hepatitis    hx hepatitis after mono as teenager  . History of kidney stones   . History of skin cancer   . Hyperlipidemia   . Hypertension   . Hypothyroidism   . IHD (ischemic heart disease)    Remote CABG in 1997  . MI, acute, non ST segment elevation ([place]) 2010   s/p cath with occluded SVG to PD that fills by collaterals and the remainder of his revasculsarization is satisfactory. "the first time they did the stent , the second time they did the graft 8 vessels"  . Nocturia   . Urinary frequency     Patient Active Problem List   Diagnosis Date Noted  . Claudication ([place]) 04/21/2016  . History of cold sores 03/22/2011  . Coronary artery disease involving native  coronary artery of native heart without angina pectoris 10/28/2010  . Hyperlipemia 10/28/2010  . Hypothyroidism 10/28/2010    Past Surgical History:  Procedure Laterality Date  . APPENDECTOMY    . CARDIAC CATHETERIZATION  09/08/2008   NORMAL. EF 60%; Occluded SVG to PD that fills by collaterals and the remainder of his revascularization is satisfactory. he is managed medically.   . CORONARY ARTERY BYPASS GRAFT  1997   CABG x 8  . CYSTOSCOPY WITH INSERTION OF UROLIFT N/A 03/19/2015   Procedure: CYSTOSCOPY WITH INSERTION OF FOUR UROLIFTS;  Surgeon: Carolan Clines, MD;  Location: [facility];  Service: Urology;  Laterality: N/A;       Home Medications    Prior to Admission medications   Medication Sig Start Date End Date Taking? Authorizing Provider  allopurinol (ZYLOPRIM) 300 MG tablet Take 300 mg by mouth daily.   Yes [provider]  aspirin 81 MG tablet Take 81 mg by mouth daily.     Yes [provider]  celecoxib (CELEBREX) 200 MG capsule Take 200 mg by mouth daily.    Yes [provider]  cyanocobalamin 1000 MCG tablet Take 100 mcg by mouth daily.   Yes [provider]  levothyroxine (SYNTHROID, LEVOTHROID) 112 MCG tablet Take 112 mcg by mouth daily before breakfast.   Yes  [provider]  losartan (COZAAR) 50 MG tablet Take 1 tablet (50 mg total) by mouth daily. 12/28/16  Yes Nahser, Wonda Cheng, MD  metoprolol succinate (TOPROL-XL) 25 MG 24 hr tablet Take 25 mg by mouth daily.   Yes [provider]  pantoprazole (PROTONIX) 40 MG tablet Take 40 mg by mouth daily.    Yes [provider]  tamsulosin (FLOMAX) 0.4 MG CAPS capsule Take 0.4 mg by mouth daily.   Yes [provider]  nitroGLYCERIN (NITROSTAT) 0.4 MG SL tablet Place 1 tablet (0.4 mg total) under the tongue every 5 (five) minutes as needed. 03/25/14   Burtis Junes, NP    Family History Family History  Problem Relation Age of Onset  . Heart failure  Mother 78    Social History Social History  Substance Use Topics  . Smoking status: Former Smoker    Packs/day: 1.00    Years: 15.00    Types: Cigarettes    Quit date: 05/15/1958  . Smokeless tobacco: Never Used  . Alcohol use Yes     Comment: occasional beer     Allergies   Halcion [triazolam] and Pravachol   Review of Systems Review of Systems  Constitutional: Positive for appetite change. Negative for fever.  HENT: Positive for voice change. Negative for congestion.   Respiratory: Negative for shortness of breath.   Cardiovascular: Negative for chest pain.  Gastrointestinal: Negative for abdominal pain.  Genitourinary: Negative for flank pain.  Musculoskeletal: Negative for back pain.  Neurological: Negative for weakness and numbness.  Hematological: Negative for adenopathy.  Psychiatric/Behavioral: Negative for confusion.     Physical Exam Updated Vital Signs BP (!) 153/76 (BP Location: Right Arm)   Pulse 90   Temp 98.7 F (37.1 C) (Oral)   Resp 18   Ht 6' (1.829 m)   Wt 76.2 kg (168 lb)   SpO2 100%   BMI 22.78 kg/m   Physical Exam  Constitutional: He appears well-developed.  Patient has emesis bag in his lap  HENT:  Head: Atraumatic.  Eyes: Pupils are equal, round, and reactive to light.  Neck: Neck supple.  Cardiovascular: Normal rate.   Pulmonary/Chest: No respiratory distress. He has no rales.  Mildly harsh voice  Abdominal: Soft. There is no tenderness.  Musculoskeletal: He exhibits no edema.  Neurological: He is alert.  Skin: Skin is warm.     ED Treatments / Results  Labs (all labs ordered are listed, but only abnormal results are displayed) Labs Reviewed  CBC WITH DIFFERENTIAL/PLATELET  TROPONIN I  BASIC METABOLIC PANEL    EKG  EKG Interpretation  Date/Time:  Monday February 12 2017 09:26:49 EDT Ventricular Rate:  65 PR Interval:    QRS Duration: 90 QT Interval:  438 QTC Calculation: 456 R Axis:   -84 Text Interpretation:   Sinus rhythm Probable left atrial enlargement Abnormal R-wave progression, late transition Inferior infarct, old No significant change since last tracing Confirmed by Davonna Belling [phone]) on 02/12/2017 9:43:19 AM       Radiology No results found.  Procedures Procedures (including critical care time)  Medications Ordered in ED Medications - No data to display   Initial Impression / Assessment and Plan / ED Course  I have reviewed the triage vital signs and the nursing notes.  Pertinent labs & imaging results that were available during my care of the patient were reviewed by me and considered in my medical decision making (see chart for details).     Patient  presents with nausea and vomiting. Tightness in throat. Doubt this is an anginal equivalent. Did not have a specific food that got caught but likely has a stricture. Has had recurrent episodes. Unable to tolerate liquids here. Will discuss with GI for likely intervention. Has seen Dr. Cristina Gong in the past.  Final Clinical Impressions(s) / ED Diagnoses   Final diagnoses:  Esophageal stricture    New Prescriptions New Prescriptions   No medications on file     Davonna Belling, MD 02/12/17 1006  Discussed with Dr. Ilda Foil. Will go to [facility] long endoscopy. We'll likely get scoped around 12:30. Should be at [facility] at around 11:30. Patient elects to go by private vehicle.   Davonna Belling, MD 02/12/17 1041

## 2017-02-12 NOTE — ED Notes (Signed)
ED Provider at bedside. 

## 2017-02-12 NOTE — ED Notes (Signed)
Pt to report to [facility] for a 1230 EDG. EDP at bedside explaining this. Pt will travel by POV with IV.

## 2017-02-12 NOTE — Anesthesia Preprocedure Evaluation (Signed)
Anesthesia Evaluation  Patient identified by MRN, date of birth, ID band Patient awake    Reviewed: Allergy & Precautions, NPO status , Patient's Chart, lab work & pertinent test results  Airway Mallampati: II  TM Distance: >3 FB Neck ROM: Full    Dental   Pulmonary former smoker,    breath sounds clear to auscultation       Cardiovascular hypertension, + CAD and + Past MI   Rhythm:Regular Rate:Normal     Neuro/Psych    GI/Hepatic GERD  Medicated,(+) Hepatitis -  Endo/Other  Hypothyroidism   Renal/GU      Musculoskeletal   Abdominal   Peds  Hematology   Anesthesia Other Findings   Reproductive/Obstetrics                             Anesthesia Physical  Anesthesia Plan  ASA: III  Anesthesia Plan: MAC   Post-op Pain Management:    Induction: Intravenous  PONV Risk Score and Plan: 2 and Ondansetron, Dexamethasone, Treatment may vary due to age or medical condition and Midazolam  Airway Management Planned: Natural Airway, Nasal Cannula and Simple Face Mask  Additional Equipment:   Intra-op Plan:   Post-operative Plan: Extubation in OR  Informed Consent: I have reviewed the patients History and Physical, chart, labs and discussed the procedure including the risks, benefits and alternatives for the proposed anesthesia with the patient or authorized representative who has indicated his/her understanding and acceptance.   Dental advisory given  Plan Discussed with: CRNA and Anesthesiologist  Anesthesia Plan Comments:         Anesthesia Quick Evaluation

## 2017-02-12 NOTE — Anesthesia Postprocedure Evaluation (Signed)
Anesthesia Post Note  Patient: Patrick [name]  Procedure(s) Performed: ESOPHAGOGASTRODUODENOSCOPY (EGD) WITH PROPOFOL (N/A )     Patient location during evaluation: PACU Anesthesia Type: MAC Level of consciousness: awake and alert Pain management: pain level controlled Vital Signs Assessment: post-procedure vital signs reviewed and stable Respiratory status: spontaneous breathing, nonlabored ventilation, respiratory function stable and patient connected to nasal cannula oxygen Cardiovascular status: stable and blood pressure returned to baseline Postop Assessment: no apparent nausea or vomiting Anesthetic complications: no    Last Vitals:  Vitals:   02/12/17 1410 02/12/17 1415  BP: 123/60   Pulse: 62 63  Resp: 16 17  Temp:    SpO2: 97% 96%    Last Pain:  Vitals:   02/12/17 1340  TempSrc: Oral  PainSc:                  [name]

## 2017-02-12 NOTE — Transfer of Care (Signed)
Immediate Anesthesia Transfer of Care Note  Patient: [place] H Emigh  Procedure(s) Performed: ESOPHAGOGASTRODUODENOSCOPY (EGD) WITH PROPOFOL (N/A )  Patient Location: PACU  Anesthesia Type:MAC  Level of Consciousness: awake, alert  and oriented  Airway & Oxygen Therapy: Patient Spontanous Breathing and Patient connected to nasal cannula oxygen  Post-op Assessment: Report given to RN  Post vital signs: Reviewed and stable  Last Vitals:  Vitals:   02/12/17 0937 02/12/17 1220  BP:  (!) 185/57  Pulse:  63  Resp:  20  Temp:  (!) 36.4 C  SpO2: 100% 100%    Last Pain:  Vitals:   02/12/17 1220  TempSrc: Oral  PainSc:          Complications: No apparent anesthesia complications

## 2017-02-12 NOTE — Consult Note (Signed)
Referring Provider:  Dr. Alvino Chapel  Primary Care Physician:  Marton Redwood, MD Primary Gastroenterologist:  Dr. Cristina Gong   Reason for Consultation:  Food impaction  HPI: [place] Patrick [name] is a 83 y.o. male with past medical history of coronary artery disease, currently on medical management, history of GERD, history of intermittent dysphagia for many years presented to [facility] ER with chief complaint of dysphagia.  Patient has intermittent dysphagia to solids for many years. Her multiple barium swallows in the past which showed motility disorder. No previous EGD. On 4 days ago he started noticing worsening symptoms with not able to eat or drink anything . Patient was doing self induce vomiting to get relief of obstruction.  He denied abdominal pain. Denied diarrhea or constipation. Denied blood in the stool or black stool. Denied active chest pain or shortness of breath.  Past Medical History:  Diagnosis Date  . BPH (benign prostatic hypertrophy)   . Diverticulosis   . ED (erectile dysfunction)   . GERD (gastroesophageal reflux disease)   . Gout   . Hepatitis    hx hepatitis after mono as teenager  . History of kidney stones   . History of skin cancer   . Hyperlipidemia   . Hypertension   . Hypothyroidism   . IHD (ischemic heart disease)    Remote CABG in 1997  . MI, acute, non ST segment elevation ([place]) 2010   s/p cath with occluded SVG to PD that fills by collaterals and the remainder of his revasculsarization is satisfactory. "the first time they did the stent , the second time they did the graft 8 vessels"  . Nocturia   . Urinary frequency     Past Surgical History:  Procedure Laterality Date  . APPENDECTOMY    . CARDIAC CATHETERIZATION  09/08/2008   NORMAL. EF 60%; Occluded SVG to PD that fills by collaterals and the remainder of his revascularization is satisfactory. he is managed medically.   . CORONARY ARTERY BYPASS GRAFT  1997   CABG x 8  . CYSTOSCOPY WITH INSERTION  OF UROLIFT N/A 03/19/2015   Procedure: CYSTOSCOPY WITH INSERTION OF FOUR UROLIFTS;  Surgeon: Carolan Clines, MD;  Location: [facility];  Service: Urology;  Laterality: N/A;    Prior to Admission medications   Medication Sig Start Date End Date Taking? Authorizing Provider  allopurinol (ZYLOPRIM) 300 MG tablet Take 300 mg by mouth daily.   Yes [provider]  aspirin 81 MG tablet Take 81 mg by mouth daily.     Yes [provider]  celecoxib (CELEBREX) 200 MG capsule Take 200 mg by mouth daily.    Yes [provider]  cyanocobalamin 1000 MCG tablet Take 100 mcg by mouth daily.   Yes [provider]  levothyroxine (SYNTHROID, LEVOTHROID) 112 MCG tablet Take 112 mcg by mouth daily before breakfast.   Yes [provider]  losartan (COZAAR) 50 MG tablet Take 1 tablet (50 mg total) by mouth daily. 12/28/16  Yes Nahser, Wonda Cheng, MD  metoprolol succinate (TOPROL-XL) 25 MG 24 hr tablet Take 25 mg by mouth daily.   Yes [provider]  pantoprazole (PROTONIX) 40 MG tablet Take 40 mg by mouth daily.    Yes [provider]  tamsulosin (FLOMAX) 0.4 MG CAPS capsule Take 0.4 mg by mouth daily.   Yes [provider]  nitroGLYCERIN (NITROSTAT) 0.4 MG SL tablet Place 1 tablet (0.4 mg total) under the tongue every 5 (five) minutes as needed. 03/25/14  Burtis Junes, NP    Scheduled Meds: Continuous Infusions: PRN Meds:.  Allergies as of 02/12/2017 - Review Complete 02/12/2017  Allergen Reaction Noted  . Halcion [triazolam]  03/20/2011  . Pravachol  10/25/2010    Family History  Problem Relation Age of Onset  . Heart failure Mother 34    Social History   Social History  . Marital status: Married    Spouse name: N/A  . Number of children: N/A  . Years of education: N/A   Occupational History  . Retired Retired   Social History Main Topics  . Smoking status: Former Smoker    Packs/day: 1.00    Years: 15.00    Types:  Cigarettes    Quit date: 05/15/1958  . Smokeless tobacco: Never Used  . Alcohol use Yes     Comment: occasional beer  . Drug use: No  . Sexual activity: Yes   Other Topics Concern  . Not on file   Social History Narrative  . No narrative on file    Review of Systems: Review of Systems  Constitutional: Positive for weight loss. Negative for chills and fever.  HENT: Negative for hearing loss and tinnitus.   Eyes: Negative for blurred vision and double vision.  Respiratory: Negative for cough, hemoptysis and sputum production.   Cardiovascular: Negative for chest pain and palpitations.  Gastrointestinal: Positive for heartburn, nausea and vomiting. Negative for abdominal pain, blood in stool, diarrhea and melena.  Genitourinary: Negative for dysuria and urgency.  Musculoskeletal: Negative for myalgias and neck pain.  Skin: Negative for rash.  Neurological: Negative for seizures and loss of consciousness.  Endo/Heme/Allergies: Does not bruise/bleed easily.  Psychiatric/Behavioral: Negative for hallucinations and suicidal ideas.    Physical Exam: Vital signs: Vitals:   02/12/17 0930 02/12/17 0937  BP: (!) 153/76   Pulse: 90   Resp: 18   Temp: 98.7 F (37.1 C)   SpO2: 99% 100%     Physical Exam  Constitutional: He is oriented to person, place, and time. He appears well-developed and well-nourished. No distress.  HENT:  Head: Normocephalic and atraumatic.  Mouth/Throat: Oropharynx is clear and moist.  Eyes: EOM are normal. No scleral icterus.  Neck: Normal range of motion. Neck supple. No thyromegaly present.  Cardiovascular: Normal rate, regular rhythm and normal heart sounds.   Pulmonary/Chest: Effort normal and breath sounds normal. No respiratory distress.  Abdominal: Soft. Bowel sounds are normal. He exhibits no distension. There is no rebound and no guarding.  Musculoskeletal: Normal range of motion. He exhibits no edema.  Neurological: He is alert and oriented to  person, place, and time.  Skin: Skin is warm. No erythema.  Psychiatric: He has a normal mood and affect. His behavior is normal. Judgment normal.  Vitals reviewed.   GI:  Lab Results:  Recent Labs  02/12/17 1021  WBC 4.5  HGB 15.7  HCT 46.0  PLT 136*   BMET  Recent Labs  02/12/17 1021  NA 138  K 4.3  CL 107  CO2 24  GLUCOSE 108*  BUN 25*  CREATININE 1.25*  CALCIUM 9.3   LFT No results for input(s): PROT, ALBUMIN, AST, ALT, ALKPHOS, BILITOT, BILIDIR, IBILI in the last 72 hours. PT/INR No results for input(s): LABPROT, INR in the last 72 hours.   Studies/Results: Dg Chest 2 View  Result Date: 02/12/2017 CLINICAL DATA:  Foreign body sensation in the throat. EXAM: CHEST  2 VIEW COMPARISON:  Chest x-ray dated September 08, 2008. FINDINGS: Postsurgical  changes related to prior CABG. The cardiomediastinal silhouette is normal in size. Normal pulmonary vascularity. No focal consolidation, pleural effusion, or pneumothorax. No acute osseous abnormality. IMPRESSION: No active cardiopulmonary disease.  No radiopaque foreign body seen. Electronically Signed   By: Titus Dubin M.D.   On: 02/12/2017 10:22    Impression/Plan: - Esophageal dysphagia with possible food impaction. - Weight loss  Recommendations -------------------------- - EGD today for further evaluation. Risk benefits alternatives discussed with the patient. He verbalized understanding. Further plan based on EGD findings.    LOS: 0 days   Otis Brace  MD, FACP 02/12/2017, 12:23 PM  Pager [phone] If no answer or after 5 PM call [phone]

## 2017-02-12 NOTE — H&P (Signed)
Please see today's consult note

## 2017-02-12 NOTE — ED Notes (Signed)
Pt verbalized understanding of discharge instructions and denies any further questions at this time.   

## 2017-02-12 NOTE — ED Notes (Signed)
Pt returned from X-ray.  

## 2017-02-12 NOTE — ED Notes (Signed)
This RN spoke with Santiago Glad in [facility] about patients arrival.

## 2017-02-12 NOTE — Op Note (Signed)
[facility] Patient Name: Patrick [name] Procedure Date: 02/12/2017 MRN: [id number] Attending MD: Otis Brace , MD Date of Birth: [birth year] CSN: [id number] Age: 83 Admit Type: Outpatient Procedure:                Upper GI endoscopy Indications:              Dysphagia, Foreign body in the esophagus Providers:                Otis Brace, MD, Cleda Daub, RN, Cherylynn Ridges, Technician, Herbie Drape, CRNA Referring MD:              Medicines:                Sedation Administered by an Anesthesia Professional Complications:            No immediate complications. Estimated Blood Loss:     Estimated blood loss was minimal. Procedure:                Pre-Anesthesia Assessment:                           - Prior to the procedure, a History and Physical                            was performed, and patient medications and                            allergies were reviewed. The patient's tolerance of                            previous anesthesia was also reviewed. The risks                            and benefits of the procedure and the sedation                            options and risks were discussed with the patient.                            All questions were answered, and informed consent                            was obtained. Prior Anticoagulants: The patient has                            taken aspirin, last dose was day of procedure. ASA                            Grade Assessment: III - A patient with severe                            systemic disease. After reviewing the risks and  benefits, the patient was deemed in satisfactory                            condition to undergo the procedure.                           After obtaining informed consent, the endoscope was                            passed under direct vision. Throughout the                            procedure, the patient's blood  pressure, pulse, and                            oxygen saturations were monitored continuously. The                            Endoscope was introduced through the mouth, and                            advanced to the second part of duodenum. The upper                            GI endoscopy was technically difficult and complex                            due to presence of food. The patient tolerated the                            procedure well. Scope In: Scope Out: Findings:      Food was found in the lower third of the esophagus. Removal of food was       accomplished using a Roth net, tolen grasper and rat-tooth with multiple       passes. Small amount of remaining food was pushed down into the stomach.       There were significant tertiary contraction during the procedure      Esophagitis with no bleeding was found at the gastroesophageal junction.      A medium-sized hiatal hernia was present.      A moderate Schatzki ring (acquired) was found in the lower third of the       esophagus.      Scattered mild inflammation characterized by congestion (edema) and       erosions was found in the gastric antrum and in the prepyloric region of       the stomach. Biopsies were taken with a cold forceps for histology.      The cardia and gastric fundus were normal on retroflexion.      The duodenal bulb, first portion of the duodenum and second portion of       the duodenum were normal. Impression:               - Food in the lower third of the esophagus. Removal  was successful.                           - Reflux esophagitis.                           - Medium-sized hiatal hernia.                           - Moderate Schatzki ring.                           - Gastritis. Biopsied.                           - Normal duodenal bulb, first portion of the                            duodenum and second portion of the duodenum. Moderate Sedation:      Moderate  (conscious) sedation was personally administered by an       anesthesia professional. The following parameters were monitored: oxygen       saturation, heart rate, blood pressure, and response to care. Recommendation:           - Patient has a contact number available for                            emergencies. The signs and symptoms of potential                            delayed complications were discussed with the                            patient. Return to normal activities tomorrow.                            Written discharge instructions were provided to the                            patient.                           - Resume previous diet. Chew food well and moist                            food before swallowing                           - Continue present medications.                           - Await pathology results.                           - Repeat upper endoscopy in 2 months for  retreatment if symptoms persist                           - Return to GI office in 6 weeks. Procedure Code(s):        --- Professional ---                           [phone], Esophagogastroduodenoscopy, flexible,                            transoral; with removal of foreign body(s)                           43239, Esophagogastroduodenoscopy, flexible,                            transoral; with biopsy, single or multiple Diagnosis Code(s):        --- Professional ---                           G38.756E, Food in esophagus causing other injury,                            initial encounter                           K21.0, Gastro-esophageal reflux disease with                            esophagitis                           K22.2, Esophageal obstruction                           K29.70, Gastritis, unspecified, without bleeding                           R13.10, Dysphagia, unspecified                           T18.108A, Unspecified foreign body in esophagus                             causing other injury, initial encounter CPT copyright 2016 [employer]. All rights reserved. The codes documented in this report are preliminary and upon coder review may  be revised to meet current compliance requirements. Otis Brace, MD Otis Brace, MD 02/12/2017 1:36:11 PM Number of Addenda: 0

## 2017-02-13 ENCOUNTER — Encounter (HOSPITAL_COMMUNITY): Payer: Self-pay | Admitting: Gastroenterology

## 2017-02-14 DIAGNOSIS — Z1212 Encounter for screening for malignant neoplasm of rectum: Secondary | ICD-10-CM | POA: Diagnosis not present

## 2017-03-08 DIAGNOSIS — R1310 Dysphagia, unspecified: Secondary | ICD-10-CM | POA: Diagnosis not present

## 2017-03-08 DIAGNOSIS — T18128D Food in esophagus causing other injury, subsequent encounter: Secondary | ICD-10-CM | POA: Diagnosis not present

## 2017-03-14 DIAGNOSIS — K224 Dyskinesia of esophagus: Secondary | ICD-10-CM | POA: Diagnosis not present

## 2017-03-14 DIAGNOSIS — K449 Diaphragmatic hernia without obstruction or gangrene: Secondary | ICD-10-CM | POA: Diagnosis not present

## 2017-03-14 DIAGNOSIS — R1310 Dysphagia, unspecified: Secondary | ICD-10-CM | POA: Diagnosis not present

## 2017-03-14 DIAGNOSIS — K222 Esophageal obstruction: Secondary | ICD-10-CM | POA: Diagnosis not present

## 2017-04-02 DIAGNOSIS — H5032 Intermittent alternating esotropia: Secondary | ICD-10-CM | POA: Diagnosis not present

## 2017-04-12 DIAGNOSIS — D1801 Hemangioma of skin and subcutaneous tissue: Secondary | ICD-10-CM | POA: Diagnosis not present

## 2017-04-12 DIAGNOSIS — Z85828 Personal history of other malignant neoplasm of skin: Secondary | ICD-10-CM | POA: Diagnosis not present

## 2017-04-12 DIAGNOSIS — L570 Actinic keratosis: Secondary | ICD-10-CM | POA: Diagnosis not present

## 2017-06-05 DIAGNOSIS — W19XXXA Unspecified fall, initial encounter: Secondary | ICD-10-CM | POA: Diagnosis not present

## 2017-06-05 DIAGNOSIS — S0083XA Contusion of other part of head, initial encounter: Secondary | ICD-10-CM | POA: Diagnosis not present

## 2017-06-05 DIAGNOSIS — S0990XA Unspecified injury of head, initial encounter: Secondary | ICD-10-CM | POA: Diagnosis not present

## 2017-06-05 DIAGNOSIS — Z87891 Personal history of nicotine dependence: Secondary | ICD-10-CM | POA: Diagnosis not present

## 2017-06-05 DIAGNOSIS — Z951 Presence of aortocoronary bypass graft: Secondary | ICD-10-CM | POA: Diagnosis not present

## 2017-06-05 DIAGNOSIS — S0101XA Laceration without foreign body of scalp, initial encounter: Secondary | ICD-10-CM | POA: Diagnosis not present

## 2017-06-05 DIAGNOSIS — S61412A Laceration without foreign body of left hand, initial encounter: Secondary | ICD-10-CM | POA: Diagnosis not present

## 2017-06-05 DIAGNOSIS — S0181XA Laceration without foreign body of other part of head, initial encounter: Secondary | ICD-10-CM | POA: Diagnosis not present

## 2017-06-05 DIAGNOSIS — I252 Old myocardial infarction: Secondary | ICD-10-CM | POA: Diagnosis not present

## 2017-06-05 DIAGNOSIS — S50812A Abrasion of left forearm, initial encounter: Secondary | ICD-10-CM | POA: Diagnosis not present

## 2017-06-05 DIAGNOSIS — I2510 Atherosclerotic heart disease of native coronary artery without angina pectoris: Secondary | ICD-10-CM | POA: Diagnosis not present

## 2017-07-03 ENCOUNTER — Other Ambulatory Visit: Payer: Self-pay | Admitting: Cardiovascular Disease

## 2017-07-18 DIAGNOSIS — R209 Unspecified disturbances of skin sensation: Secondary | ICD-10-CM | POA: Diagnosis not present

## 2017-07-18 DIAGNOSIS — Z6823 Body mass index (BMI) 23.0-23.9, adult: Secondary | ICD-10-CM | POA: Diagnosis not present

## 2017-07-18 DIAGNOSIS — G608 Other hereditary and idiopathic neuropathies: Secondary | ICD-10-CM | POA: Diagnosis not present

## 2017-07-30 ENCOUNTER — Other Ambulatory Visit: Payer: Self-pay | Admitting: Cardiovascular Disease

## 2017-07-30 MED ORDER — NITROGLYCERIN 0.4 MG SL SUBL: 0.4000 mg | SUBLINGUAL_TABLET | SUBLINGUAL | 4 refills | Status: DC | PRN

## 2017-08-28 ENCOUNTER — Other Ambulatory Visit: Payer: Self-pay | Admitting: Cardiovascular Disease

## 2017-10-02 ENCOUNTER — Other Ambulatory Visit: Payer: Self-pay | Admitting: Cardiovascular Disease

## 2017-10-22 ENCOUNTER — Other Ambulatory Visit: Payer: Self-pay | Admitting: Cardiovascular Disease

## 2017-10-23 DIAGNOSIS — D485 Neoplasm of uncertain behavior of skin: Secondary | ICD-10-CM | POA: Diagnosis not present

## 2017-10-23 DIAGNOSIS — C44319 Basal cell carcinoma of skin of other parts of face: Secondary | ICD-10-CM | POA: Diagnosis not present

## 2017-10-23 DIAGNOSIS — L570 Actinic keratosis: Secondary | ICD-10-CM | POA: Diagnosis not present

## 2017-10-23 DIAGNOSIS — Z85828 Personal history of other malignant neoplasm of skin: Secondary | ICD-10-CM | POA: Diagnosis not present

## 2017-10-25 ENCOUNTER — Other Ambulatory Visit: Payer: Medicare Other

## 2017-10-25 ENCOUNTER — Encounter: Payer: Self-pay | Admitting: Cardiovascular Disease

## 2017-10-25 ENCOUNTER — Ambulatory Visit (INDEPENDENT_AMBULATORY_CARE_PROVIDER_SITE_OTHER): Payer: Medicare Other | Admitting: Cardiovascular Disease

## 2017-10-25 VITALS — BP 142/62 | HR 62 | Ht 71.0 in | Wt 167.0 lb

## 2017-10-25 DIAGNOSIS — I2510 Atherosclerotic heart disease of native coronary artery without angina pectoris: Secondary | ICD-10-CM | POA: Diagnosis not present

## 2017-10-25 DIAGNOSIS — E782 Mixed hyperlipidemia: Secondary | ICD-10-CM | POA: Diagnosis not present

## 2017-10-25 LAB — BASIC METABOLIC PANEL
BUN/Creatinine Ratio: 16 (ref 10–24)
BUN: 19 mg/dL (ref 8–27)
CALCIUM: 9.4 mg/dL (ref 8.6–10.2)
CHLORIDE: 104 mmol/L (ref 96–106)
CO2: 22 mmol/L (ref 20–29)
Creatinine, Ser: 1.22 mg/dL (ref 0.76–1.27)
GFR calc Af Amer: 61 mL/min/{1.73_m2} (ref >59)
GFR calc non Af Amer: 53 mL/min/{1.73_m2} — ABNORMAL LOW (ref >59)
Glucose: 98 mg/dL (ref 65–99)
Potassium: 4.5 mmol/L (ref 3.5–5.2)
Sodium: 140 mmol/L (ref 134–144)

## 2017-10-25 LAB — HEPATIC FUNCTION PANEL
ALBUMIN: 4.7 g/dL (ref 3.5–4.7)
ALT: 10 IU/L (ref 0–44)
AST: 17 IU/L (ref 0–40)
Alkaline Phosphatase: 66 IU/L (ref 39–117)
BILIRUBIN TOTAL: 0.7 mg/dL (ref 0.0–1.2)
Bilirubin, Direct: 0.16 mg/dL (ref 0.00–0.40)
TOTAL PROTEIN: 7 g/dL (ref 6.0–8.5)

## 2017-10-25 LAB — LIPID PANEL
CHOLESTEROL TOTAL: 155 mg/dL (ref 100–199)
Chol/HDL Ratio: 4 ratio (ref 0.0–5.0)
HDL: 39 mg/dL — ABNORMAL LOW (ref >39)
LDL Calculated: 93 mg/dL (ref 0–99)
TRIGLYCERIDES: 113 mg/dL (ref 0–149)
VLDL Cholesterol Cal: 23 mg/dL (ref 5–40)

## 2017-10-25 NOTE — Progress Notes (Signed)
Patrick [name] Date of Birth  11/06/1926 [facility] [address]    [address]     [place], [place]  [address]         Problems: 1. Coronary artery disease- status post CABG 1997.  2. Hyperlipidemia 3. Hypothyroidism     Patrick [name] is an 84 yo with the above noted hx.  He exercises every day ( walks for an hour a day).  He complains of fatigue when doing yard work.  He's not had any cardiac complaints.  August 30, 2012:  He still complains of generalized fatigue.   He has been having fatigued for the past several office visits.  He is really frustrated with his fatigue.  Questions whether or not it is due to his medications.  Oct. 23, 2014:  Patrick [name] is doing well.  Exercising regularly - energy is ok.    August 27, 2013:  Patrick [name] is doing ok.  He walks occasionally but not regularly.  He knows that he needs to get back into it.   Fatigues easily.    Aug. 30, 2016: Patrick [name] is doing well. No CP  Has had some night sweats - has  occurred several time Does his yard work without problems   Sept. 1, 2017: Doing well from a cardiac standpoint Has more fatigued than he would like He has had an episode of orthostatic hypotension.  Has developed a peripheral neuropathy in his toes. Was started on Vit B12 .   Dec. 8, 2017:  Patrick [name] was seen with wife, Patrick [name].   More fatigue recently  Does not exercise anymore.    Aug. 16. 2018  Patrick [name] is doing well  No CP or dyspnea.  Had an episode of generalized weakness after incercourse on one occasion  October 25, 2017:  Has developed peripheraph neuropathy. No angina .  No dyspnea.   Still very active.   Current Outpatient Medications on File Prior to Visit  Medication Sig Dispense Refill  . allopurinol (ZYLOPRIM) 300 MG tablet Take 300 mg by mouth daily.    Marland Kitchen aspirin 81 MG tablet Take 81 mg by mouth daily.      . celecoxib (CELEBREX) 200 MG capsule Take 200 mg by mouth daily.     . cyanocobalamin 1000 MCG tablet  Take 100 mcg by mouth daily.    Marland Kitchen levothyroxine (SYNTHROID, LEVOTHROID) 112 MCG tablet Take 112 mcg by mouth daily before breakfast.    . losartan (COZAAR) 50 MG tablet Take 50 mg by mouth daily.    . metoprolol succinate (TOPROL-XL) 25 MG 24 hr tablet Take 25 mg by mouth daily.    . nitroGLYCERIN (NITROSTAT) 0.4 MG SL tablet PLACE 1 TABLET (0.4 MG TOTAL) UNDER THE TONGUE EVERY 5 (FIVE) MINUTES AS NEEDED. 25 tablet 2  . pantoprazole (PROTONIX) 40 MG tablet Take 40 mg by mouth daily.     . tamsulosin (FLOMAX) 0.4 MG CAPS capsule Take 0.4 mg by mouth daily.    . Vitamin D, Ergocalciferol, (DRISDOL) 50000 units CAPS capsule Take 50,000 Units by mouth once a week.  3   No current facility-administered medications on file prior to visit.     Allergies  Allergen Reactions  . Halcion [Triazolam]     "Makes me crazy"  . Pravachol     Unknown    Past Medical History:  Diagnosis Date  . BPH (benign prostatic hypertrophy)   . Diverticulosis   . ED (  erectile dysfunction)   . GERD (gastroesophageal reflux disease)   . Gout   . Hepatitis    hx hepatitis after mono as teenager  . History of kidney stones   . History of skin cancer   . Hyperlipidemia   . Hypertension   . Hypothyroidism   . IHD (ischemic heart disease)    Remote CABG in 1997  . MI, acute, non ST segment elevation ([place]) 2010   s/p cath with occluded SVG to PD that fills by collaterals and the remainder of his revasculsarization is satisfactory. "the first time they did the stent , the second time they did the graft 8 vessels"  . Nocturia   . Urinary frequency     Past Surgical History:  Procedure Laterality Date  . APPENDECTOMY    . CARDIAC CATHETERIZATION  09/08/2008   NORMAL. EF 60%; Occluded SVG to PD that fills by collaterals and the remainder of his revascularization is satisfactory. he is managed medically.   . CORONARY ARTERY BYPASS GRAFT  1997   CABG x 8  . CYSTOSCOPY WITH INSERTION OF UROLIFT N/A 03/19/2015    Procedure: CYSTOSCOPY WITH INSERTION OF FOUR UROLIFTS;  Surgeon: Carolan Clines, MD;  Location: [facility];  Service: Urology;  Laterality: N/A;  . ESOPHAGOGASTRODUODENOSCOPY (EGD) WITH PROPOFOL N/A 02/12/2017   Procedure: ESOPHAGOGASTRODUODENOSCOPY (EGD) WITH PROPOFOL;  Surgeon: Otis Brace, MD;  Location: [facility];  Service: Gastroenterology;  Laterality: N/A;    Social History   Tobacco Use  Smoking Status Former Smoker  . Packs/day: 1.00  . Years: 15.00  . Pack years: 15.00  . Types: Cigarettes  . Last attempt to quit: 05/15/1958  . Years since quitting: 59.4  Smokeless Tobacco Never Used    Social History   Substance and Sexual Activity  Alcohol Use Yes   Comment: occasional beer    Family History  Problem Relation Age of Onset  . Heart failure Mother 62    Reviw of Systems:  Reviewed in the HPI.  All other systems are negative.  Physical Exam: Blood pressure (!) 142/62, pulse 62, height 5\' 11"  (1.803 m), weight 167 lb (75.8 kg), SpO2 99 %. General: Well developed, well nourished, in no acute distress.  Head: Normocephalic, atraumatic, sclera non-icteric, mucus membranes are moist,  Neck: Supple. Carotids are 2 + without bruits. No JVD Lungs: Clear bilaterally to auscultation. Heart: regular rate.  normal  S1 S2. No murmurs, gallops or rubs. Abdomen: Soft, non-tender, non-distended with normal bowel sounds. No hepatomegaly. No rebound/guarding. No masses. Msk:  Strength and tone are normal Extremities: No clubbing or cyanosis. No edema.  Left foot posterior tibialis pulse is 1+ .   I was not able to feel his distal right foot pulses.  Toes are cyanotic.   Neuro: Alert and oriented X 3. Moves all extremities spontaneously. Psych:  Responds to questions appropriately with a normal affect.  ECG:    Assessment / Plan:   1. Coronary artery disease-   He is not having any episodes of angina. Check fasting lipids today.  2. Hyperlipidemia-   his last LDL  was 110.  While this is not all that high he does have a history of coronary artery disease.  His goal is around 70.  Even at age 9 he still very young and fairly healthy.  I think he would benefit from being on a statin.  Will check fasting labs today.   3.  Leg pain/peripheral neuropathy:.  Is been found to have peripheral  neuropathy.  His symptoms are not limiting at this point.  4.  Essential HTN:   Blood pressures typically well controlled.  Tends to go up slightly when he comes to the doctor.  Continue to follow.    Mertie Moores, MD  10/25/2017 10:19 AM    [address],  [place], [place]  [address] Pager [phone] Phone: [phone]; Fax: [phone]

## 2017-10-25 NOTE — Patient Instructions (Signed)
Medication Instructions:  Your physician recommends that you continue on your current medications as directed. Please refer to the Current Medication list given to you today.   Labwork: TODAY - basic metabolic panel, liver panel, cholesterol   Testing/Procedures: None Ordered   Follow-Up: Your physician wants you to follow-up in: 1 year with Dr. Acie Fredrickson. You will receive a reminder letter in the mail two months in advance. If you don't receive a letter, please call our office to schedule the follow-up appointment.   If you need a refill on your cardiac medications before your next appointment, please call your pharmacy.   Thank you for choosing [facility]! Christen Bame, RN [phone]

## 2017-11-23 ENCOUNTER — Other Ambulatory Visit: Payer: Self-pay | Admitting: Cardiovascular Disease

## 2017-12-18 DIAGNOSIS — R3912 Poor urinary stream: Secondary | ICD-10-CM | POA: Diagnosis not present

## 2017-12-18 DIAGNOSIS — N401 Enlarged prostate with lower urinary tract symptoms: Secondary | ICD-10-CM | POA: Diagnosis not present

## 2017-12-18 DIAGNOSIS — R351 Nocturia: Secondary | ICD-10-CM | POA: Diagnosis not present

## 2017-12-18 DIAGNOSIS — N5201 Erectile dysfunction due to arterial insufficiency: Secondary | ICD-10-CM | POA: Diagnosis not present

## 2017-12-24 ENCOUNTER — Other Ambulatory Visit: Payer: Self-pay | Admitting: Cardiovascular Disease

## 2018-02-06 DIAGNOSIS — M859 Disorder of bone density and structure, unspecified: Secondary | ICD-10-CM | POA: Diagnosis not present

## 2018-02-06 DIAGNOSIS — M109 Gout, unspecified: Secondary | ICD-10-CM | POA: Diagnosis not present

## 2018-02-06 DIAGNOSIS — R7301 Impaired fasting glucose: Secondary | ICD-10-CM | POA: Diagnosis not present

## 2018-02-06 DIAGNOSIS — E538 Deficiency of other specified B group vitamins: Secondary | ICD-10-CM | POA: Diagnosis not present

## 2018-02-06 DIAGNOSIS — I10 Essential (primary) hypertension: Secondary | ICD-10-CM | POA: Diagnosis not present

## 2018-02-06 DIAGNOSIS — E038 Other specified hypothyroidism: Secondary | ICD-10-CM | POA: Diagnosis not present

## 2018-02-06 DIAGNOSIS — R82998 Other abnormal findings in urine: Secondary | ICD-10-CM | POA: Diagnosis not present

## 2018-02-06 DIAGNOSIS — E7849 Other hyperlipidemia: Secondary | ICD-10-CM | POA: Diagnosis not present

## 2018-02-13 DIAGNOSIS — Z23 Encounter for immunization: Secondary | ICD-10-CM | POA: Diagnosis not present

## 2018-02-13 DIAGNOSIS — Z1389 Encounter for screening for other disorder: Secondary | ICD-10-CM | POA: Diagnosis not present

## 2018-02-13 DIAGNOSIS — I10 Essential (primary) hypertension: Secondary | ICD-10-CM | POA: Diagnosis not present

## 2018-02-13 DIAGNOSIS — E038 Other specified hypothyroidism: Secondary | ICD-10-CM | POA: Diagnosis not present

## 2018-02-13 DIAGNOSIS — E538 Deficiency of other specified B group vitamins: Secondary | ICD-10-CM | POA: Diagnosis not present

## 2018-02-13 DIAGNOSIS — E7849 Other hyperlipidemia: Secondary | ICD-10-CM | POA: Diagnosis not present

## 2018-02-13 DIAGNOSIS — G608 Other hereditary and idiopathic neuropathies: Secondary | ICD-10-CM | POA: Diagnosis not present

## 2018-02-13 DIAGNOSIS — R7301 Impaired fasting glucose: Secondary | ICD-10-CM | POA: Diagnosis not present

## 2018-02-13 DIAGNOSIS — Z0000 Encounter for general adult medical examination without abnormal findings: Secondary | ICD-10-CM | POA: Diagnosis not present

## 2018-02-13 DIAGNOSIS — Z6823 Body mass index (BMI) 23.0-23.9, adult: Secondary | ICD-10-CM | POA: Diagnosis not present

## 2018-02-13 DIAGNOSIS — G3184 Mild cognitive impairment, so stated: Secondary | ICD-10-CM | POA: Diagnosis not present

## 2018-02-13 DIAGNOSIS — N183 Chronic kidney disease, stage 3 (moderate): Secondary | ICD-10-CM | POA: Diagnosis not present

## 2018-02-13 DIAGNOSIS — I208 Other forms of angina pectoris: Secondary | ICD-10-CM | POA: Diagnosis not present

## 2018-02-18 ENCOUNTER — Telehealth: Payer: Self-pay | Admitting: Cardiovascular Disease

## 2018-02-18 NOTE — Telephone Encounter (Signed)
Follow up: ° ° °Patient returning call  ° ° ° °

## 2018-02-18 NOTE — Telephone Encounter (Signed)
Spoke with patient's wife who states someone called the patient from our office to schedule an appointment. She states patient saw PCP, Dr. Brigitte Pulse recently and complained of chest pain with exertion. She states Dr. Brigitte Pulse sent a request for an appointment with Dr. Acie Fredrickson to our office. I scheduled patient to see Robbie Lis, PA on Wednesday due to Dr. Acie Fredrickson does not return to the office until 10/21. [name] thanked me for the call.

## 2018-02-20 ENCOUNTER — Ambulatory Visit: Payer: Medicare Other | Admitting: Physician Assistant

## 2018-02-22 ENCOUNTER — Encounter: Payer: Self-pay | Admitting: Cardiology

## 2018-02-25 ENCOUNTER — Ambulatory Visit (INDEPENDENT_AMBULATORY_CARE_PROVIDER_SITE_OTHER): Payer: Medicare Other | Admitting: Cardiology

## 2018-02-25 ENCOUNTER — Encounter: Payer: Self-pay | Admitting: Cardiology

## 2018-02-25 VITALS — BP 154/60 | HR 62 | Ht 71.0 in | Wt 166.4 lb

## 2018-02-25 DIAGNOSIS — E782 Mixed hyperlipidemia: Secondary | ICD-10-CM

## 2018-02-25 DIAGNOSIS — I10 Essential (primary) hypertension: Secondary | ICD-10-CM | POA: Diagnosis not present

## 2018-02-25 DIAGNOSIS — I2510 Atherosclerotic heart disease of native coronary artery without angina pectoris: Secondary | ICD-10-CM

## 2018-02-25 DIAGNOSIS — R079 Chest pain, unspecified: Secondary | ICD-10-CM | POA: Diagnosis not present

## 2018-02-25 MED ORDER — ATORVASTATIN CALCIUM 20 MG PO TABS: 20.0000 mg | ORAL_TABLET | Freq: Every day | ORAL | 3 refills | Status: DC

## 2018-02-25 MED ORDER — LOSARTAN POTASSIUM 100 MG PO TABS: 100.0000 mg | ORAL_TABLET | Freq: Every day | ORAL | 3 refills | Status: DC

## 2018-02-25 NOTE — Progress Notes (Signed)
Cardiology Office Note:    Date:  02/25/2018   ID:  Patrick [name], DOB [birth year], MRN [id number]  PCP:  Patrick Redwood, MD  Cardiologist:  Patrick Moores, MD  Referring MD: Patrick Redwood, MD   Chief Complaint  Patient presents with  . Chest Pain    with exertion    History of Present Illness:    Patrick [name] is a 84 y.o. male with a past medical history significant for CAD s/p CABG 1997, [place] 2010 with occluded SVG to PD, HLD, HTN, hypothyroidism, peripheral neuropathy  and GERD.  He is here today with complaints of chest pain with exertion while at PCP office. His wife is with him.   When [place] he has developed chest burning/stinging on the way back up. Has been occurring for the last month, not every time he makes that climb. Sometimes he has to sit down to recover. It resolves with rest. He denies shortness of breath or overt pain. Outside at this one location is the only place that this occurs, not with climbing stairs inside or walking on flat ground.   He has neuropathy in his feet and hands, now going up his legs. No orthopnea, PND, palpitations, lightheadedness, syncope.   Past Medical History:  Diagnosis Date  . BPH (benign prostatic hypertrophy)   . Diverticulosis   . ED (erectile dysfunction)   . GERD (gastroesophageal reflux disease)   . Gout   . Hepatitis    hx hepatitis after mono as teenager  . History of kidney stones   . History of skin cancer   . Hyperlipidemia   . Hypertension   . Hypothyroidism   . IHD (ischemic heart disease)    Remote CABG in 1997  . MI, acute, non ST segment elevation ([place]) 2010   s/p cath with occluded SVG to PD that fills by collaterals and the remainder of his revasculsarization is satisfactory. "the first time they did the stent , the second time they did the graft 8 vessels"  . Nocturia   . Urinary frequency     Past Surgical History:  Procedure  Laterality Date  . APPENDECTOMY    . CARDIAC CATHETERIZATION  09/08/2008   NORMAL. EF 60%; Occluded SVG to PD that fills by collaterals and the remainder of his revascularization is satisfactory. he is managed medically.   . CORONARY ARTERY BYPASS GRAFT  1997   CABG x 8  . CYSTOSCOPY WITH INSERTION OF UROLIFT N/A 03/19/2015   Procedure: CYSTOSCOPY WITH INSERTION OF FOUR UROLIFTS;  Surgeon: Patrick Clines, MD;  Location: [facility];  Service: Urology;  Laterality: N/A;  . ESOPHAGOGASTRODUODENOSCOPY (EGD) WITH PROPOFOL N/A 02/12/2017   Procedure: ESOPHAGOGASTRODUODENOSCOPY (EGD) WITH PROPOFOL;  Surgeon: Patrick Brace, MD;  Location[facility];  Service: Gastroenterology;  Laterality: N/A;    Current Medications: Current Meds  Medication Sig  . allopurinol (ZYLOPRIM) 300 MG tablet Take 300 mg by mouth daily.  Marland Kitchen aspirin 81 MG tablet Take 81 mg by mouth daily.    . celecoxib (CELEBREX) 200 MG capsule Take 200 mg by mouth daily.   . cyanocobalamin 1000 MCG tablet Take 100 mcg by mouth daily.  Marland Kitchen donepezil (ARICEPT) 5 MG tablet TAKE 1 TABLET BY MOUTH AT BEDTIME FOR 1 MONTH  . levothyroxine (SYNTHROID, LEVOTHROID) 112 MCG tablet Take 112 mcg by mouth daily before breakfast.  . losartan (COZAAR) 100 MG tablet Take 1 tablet (100  mg total) by mouth daily.  . metoprolol succinate (TOPROL-XL) 25 MG 24 hr tablet Take 25 mg by mouth daily.  . nitroGLYCERIN (NITROSTAT) 0.4 MG SL tablet PLACE 1 TABLET (0.4 MG TOTAL) UNDER THE TONGUE EVERY 5 (FIVE) MINUTES AS NEEDED.  Marland Kitchen pantoprazole (PROTONIX) 40 MG tablet Take 40 mg by mouth daily.   . tamsulosin (FLOMAX) 0.4 MG CAPS capsule Take 0.4 mg by mouth daily.  . Vitamin D, Ergocalciferol, (DRISDOL) 50000 units CAPS capsule Take 50,000 Units by mouth once a week.  . [DISCONTINUED] losartan (COZAAR) 50 MG tablet Take 1 tablet (50 mg total) by mouth daily.     Allergies:   Halcion [triazolam] and Pravachol   Social History   Socioeconomic History  .  Marital status: Married    Spouse name: Not on file  . Number of children: Not on file  . Years of education: Not on file  . Highest education level: Not on file  Occupational History  . Occupation: Retired    Fish farm manager: RETIRED  Social Needs  . Financial resource strain: Not on file  . Food insecurity:    Worry: Not on file    Inability: Not on file  . Transportation needs:    Medical: Not on file    Non-medical: Not on file  Tobacco Use  . Smoking status: Former Smoker    Packs/day: 1.00    Years: 15.00    Pack years: 15.00    Types: Cigarettes    Last attempt to quit: 05/15/1958    Years since quitting: 59.8  . Smokeless tobacco: Never Used  Substance and Sexual Activity  . Alcohol use: Yes    Comment: occasional beer  . Drug use: No  . Sexual activity: Yes  Lifestyle  . Physical activity:    Days per week: Not on file    Minutes per session: Not on file  . Stress: Not on file  Relationships  . Social connections:    Talks on phone: Not on file    Gets together: Not on file    Attends religious service: Not on file    Active member of club or organization: Not on file    Attends meetings of clubs or organizations: Not on file    Relationship status: Not on file  Other Topics Concern  . Not on file  Social History Narrative  . Not on file     Family History: The patient's family history includes Heart failure (age of onset: 76) in his mother. ROS:   Please see the history of present illness.     All other systems reviewed and are negative.  EKGs/Labs/Other Studies Reviewed:    The following studies were reviewed today:  Myocardial perfusion study 01/27/16  Nuclear stress EF: 66%.  There was no ST segment deviation noted during stress.  No T wave inversion was noted during stress.  Defect 1: There is a small defect of mild severity present in the mid inferolateral location.  Findings consistent with ischemia.  This is a low risk study. Low risk  stress nuclear study with a small area of very mild inferolateral ischemia, otherwise normal perfusion and normal left ventricular regional and global systolic function.    EKG:  EKG is ordered today.  The ekg ordered today demonstrates NSR, LAD, inferior Q waves- chronic, 62 bpm  Recent Labs: 10/25/2017: ALT 10; BUN 19; Creatinine, Ser 1.22; Potassium 4.5; Sodium 140   Recent Lipid Panel    Component Value Date/Time  CHOL 155 10/25/2017 1036   TRIG 113 10/25/2017 1036   HDL 39 (L) 10/25/2017 1036   CHOLHDL 4.0 10/25/2017 1036   CHOLHDL 4 08/27/2013 1125   VLDL 18.8 08/27/2013 1125   LDLCALC 93 10/25/2017 1036    Physical Exam:    VS:  BP (!) 154/60   Pulse 62   Ht 5\' 11"  (1.803 m)   Wt 166 lb 6.4 oz (75.5 kg)   SpO2 99%   BMI 23.21 kg/m     Wt Readings from Last 3 Encounters:  02/25/18 166 lb 6.4 oz (75.5 kg)  10/25/17 167 lb (75.8 kg)  02/12/17 168 lb (76.2 kg)     Physical Exam  Constitutional: He is oriented to person, place, and time. He appears well-developed and well-nourished. No distress.  HENT:  Head: Normocephalic and atraumatic.  Neck: Normal range of motion. Neck supple. No JVD present.  Cardiovascular: Normal rate and regular rhythm. Exam reveals no gallop and no friction rub.  Murmur heard.  Harsh midsystolic murmur is present with a grade of 2/6 at the upper right sternal border. Pulmonary/Chest: Effort normal and breath sounds normal. No respiratory distress. He has no rales.  Abdominal: Soft. Bowel sounds are normal.  Musculoskeletal: Normal range of motion. He exhibits no edema.  Neurological: He is alert and oriented to person, place, and time.  Skin: Skin is warm and dry.  Psychiatric: He has a normal mood and affect. His behavior is normal. Judgment and thought content normal.  Vitals reviewed.   ASSESSMENT:    1. Chest pain on exertion   2. Coronary artery disease involving native coronary artery of native heart without angina pectoris    3. Essential (primary) hypertension   4. Mixed hyperlipidemia    PLAN:    In order of problems listed above:  CAD/ chest pain with exertion: Pt with mid chest burning/stinging with exertion over the last month. No shortness of breath or orthopnea. He is an active 84 yr old. Will check lexiscan myoview and compare to prior study in 2017 per pt and his wife's desire. He and his wife understand that a high risk study would lead to a cardiac cath and he would want that done.   HTN: BP is mildly elevated. May be contributing to his symptoms. Will increase losartan to 100 mg daily. His wife will monitor BP at home periodically and notify us if he gets any lightheadedness as would decrease dose back to 50 mg.   HLD: LDL is 93. Goal LDL <70. He did not tolerate Pravachol in the past. He agrees to try atorvastatin 20 mg daily. He can decrease to every other day if develops muscle pain.    Medication Adjustments/Labs and Tests Ordered: Current medicines are reviewed at length with the patient today.  Concerns regarding medicines are outlined above. Labs and tests ordered and medication changes are outlined in the patient instructions below:  Patient Instructions  Medication Instructions:  START: Atorvastatin 20 mg daily INCREASE: Losartan to 100 mg daily  If you need a refill on your cardiac medications before your next appointment, please call your pharmacy.   Lab work: None  If you have labs (blood work) drawn today and your tests are completely normal, you will receive your results only by: Marland Kitchen MyChart Message (if you have MyChart) OR . A paper copy in the mail If you have any lab test that is abnormal or we need to change your treatment, we will call you to review  the results.  Testing/Procedures: Your physician has requested that you have a lexiscan myoview. For further information please visit [email or web address]. Please follow instruction sheet, as given.    Follow-Up: At [facility] you and your health needs are our priority.  As part of our continuing mission to provide you with exceptional heart care, we have created designated Provider Care Teams.  These Care Teams include your primary Cardiologist (physician) and Advanced Practice Providers (APPs -  Physician Assistants and Nurse Practitioners) who all work together to provide you with the care you need, when you need it. You will need a follow up appointment in:  3-4 months.  Please call our office 2 months in advance to schedule this appointment.  You may see Patrick Moores, MD or one of the following Advanced Practice Providers on your designated Care Team: Richardson Dopp, PA-C [place], [place] . Daune Perch, NP  Any Other Special Instructions Will Be Listed Below (If Applicable).       Signed, Daune Perch, NP  02/25/2018 4:34 PM    [facility]

## 2018-02-25 NOTE — Patient Instructions (Signed)
Medication Instructions:  START: Atorvastatin 20 mg daily INCREASE: Losartan to 100 mg daily  If you need a refill on your cardiac medications before your next appointment, please call your pharmacy.   Lab work: None  If you have labs (blood work) drawn today and your tests are completely normal, you will receive your results only by: Marland Kitchen MyChart Message (if you have MyChart) OR . A paper copy in the mail If you have any lab test that is abnormal or we need to change your treatment, we will call you to review the results.  Testing/Procedures: Your physician has requested that you have a lexiscan myoview. For further information please visit [email or web address]. Please follow instruction sheet, as given.    Follow-Up: At [facility], you and your health needs are our priority.  As part of our continuing mission to provide you with exceptional heart care, we have created designated Provider Care Teams.  These Care Teams include your primary Cardiologist (physician) and Advanced Practice Providers (APPs -  Physician Assistants and Nurse Practitioners) who all work together to provide you with the care you need, when you need it. You will need a follow up appointment in:  3-4 months.  Please call our office 2 months in advance to schedule this appointment.  You may see Mertie Moores, MD or one of the following Advanced Practice Providers on your designated Care Team: Richardson Dopp, PA-C [place], [place] . Daune Perch, NP  Any Other Special Instructions Will Be Listed Below (If Applicable).

## 2018-03-05 ENCOUNTER — Telehealth (HOSPITAL_COMMUNITY): Payer: Self-pay

## 2018-03-05 NOTE — Telephone Encounter (Signed)
Pt contacted and given detailed instructions. Pt stated that he would be here for his appointment. [name] EMTP

## 2018-03-07 ENCOUNTER — Ambulatory Visit (HOSPITAL_COMMUNITY): Payer: Medicare Other | Attending: Cardiology

## 2018-03-07 VITALS — Ht 71.0 in | Wt 166.0 lb

## 2018-03-07 DIAGNOSIS — I2510 Atherosclerotic heart disease of native coronary artery without angina pectoris: Secondary | ICD-10-CM | POA: Diagnosis not present

## 2018-03-07 DIAGNOSIS — R079 Chest pain, unspecified: Secondary | ICD-10-CM

## 2018-03-07 LAB — MYOCARDIAL PERFUSION IMAGING
LVDIAVOL: 81 mL (ref 62–150)
LVSYSVOL: 28 mL
Peak HR: 85 {beats}/min
Rest HR: 58 {beats}/min
SDS: 4
SRS: 0
SSS: 4
TID: 1.13

## 2018-03-07 MED ORDER — REGADENOSON 0.4 MG/5ML IV SOLN
0.4000 mg | Freq: Once | INTRAVENOUS | Status: AC
  Administered 2018-03-07: 0.4 mg via INTRAVENOUS

## 2018-03-07 MED ORDER — TECHNETIUM TC 99M TETROFOSMIN IV KIT
32.8000 | PACK | Freq: Once | INTRAVENOUS | Status: AC | PRN
  Administered 2018-03-07: 32.8 via INTRAVENOUS
  Filled 2018-03-07: qty 33

## 2018-03-07 MED ORDER — TECHNETIUM TC 99M TETROFOSMIN IV KIT
10.3000 | PACK | Freq: Once | INTRAVENOUS | Status: AC | PRN
  Administered 2018-03-07: 10.3 via INTRAVENOUS
  Filled 2018-03-07: qty 11

## 2018-03-21 ENCOUNTER — Ambulatory Visit (INDEPENDENT_AMBULATORY_CARE_PROVIDER_SITE_OTHER): Payer: Medicare Other | Admitting: Podiatry

## 2018-03-21 ENCOUNTER — Ambulatory Visit (INDEPENDENT_AMBULATORY_CARE_PROVIDER_SITE_OTHER): Payer: Medicare Other

## 2018-03-21 VITALS — BP 171/78 | HR 63

## 2018-03-21 DIAGNOSIS — G629 Polyneuropathy, unspecified: Secondary | ICD-10-CM

## 2018-03-21 DIAGNOSIS — M779 Enthesopathy, unspecified: Secondary | ICD-10-CM | POA: Diagnosis not present

## 2018-03-21 DIAGNOSIS — I2510 Atherosclerotic heart disease of native coronary artery without angina pectoris: Secondary | ICD-10-CM

## 2018-03-21 DIAGNOSIS — R2681 Unsteadiness on feet: Secondary | ICD-10-CM

## 2018-03-21 MED ORDER — GABAPENTIN 100 MG PO CAPS: 100.0000 mg | ORAL_CAPSULE | Freq: Every day | ORAL | 3 refills | Status: DC

## 2018-03-21 NOTE — Progress Notes (Signed)
Subjective:   Patient ID: Patrick [name], male   DOB: 84 y.o.   MRN: [id number]   HPI Patient states he is been developing progressive neuropathy for the last 10 years with gradual worsening of situation with what appears to be some moderate balance issues with some tingling burning occurring at night.  States is gradually becoming more aggravating for him   Review of Systems  All other systems reviewed and are negative.       Objective:  Physical Exam  Constitutional: He appears well-developed and well-nourished.  Cardiovascular: Intact distal pulses.  Pulmonary/Chest: Effort normal.  Musculoskeletal: Normal range of motion.  Neurological: He is alert.  Skin: Skin is warm.  Nursing note and vitals reviewed.   Vascular status diminished but intact with neurological diminished both sharp dull vibratory bilateral.  Patient is noted to have instability of a moderate nature and does have a slow gait pattern secondary to the neuropathy and gait imbalance     Assessment:  Gait and balance with neuropathy and mild capsulitis tendinitis-like symptoms     Plan:  H&P conditions reviewed discussed possible balance bracing in future and today were to start him on very low-dose gabapentin 100 mg at night to try to see if this will help the disc comfort that he achieves.  Reappoint as indicated may consider balance bracing in future or possible other treatments for neuropathy  X-ray indicates there is no signs of stress fracture fracture or advanced arthritis bilateral signed visit

## 2018-05-23 DIAGNOSIS — H5213 Myopia, bilateral: Secondary | ICD-10-CM | POA: Diagnosis not present

## 2018-05-23 DIAGNOSIS — H5032 Intermittent alternating esotropia: Secondary | ICD-10-CM | POA: Diagnosis not present

## 2018-05-23 DIAGNOSIS — H16223 Keratoconjunctivitis sicca, not specified as Sjogren's, bilateral: Secondary | ICD-10-CM | POA: Diagnosis not present

## 2018-05-28 ENCOUNTER — Ambulatory Visit (INDEPENDENT_AMBULATORY_CARE_PROVIDER_SITE_OTHER): Payer: Medicare Other | Admitting: Cardiovascular Disease

## 2018-05-28 ENCOUNTER — Encounter (INDEPENDENT_AMBULATORY_CARE_PROVIDER_SITE_OTHER): Payer: Self-pay

## 2018-05-28 ENCOUNTER — Encounter: Payer: Self-pay | Admitting: Cardiovascular Disease

## 2018-05-28 VITALS — BP 160/70 | HR 81 | Ht 71.0 in | Wt 167.6 lb

## 2018-05-28 DIAGNOSIS — I2510 Atherosclerotic heart disease of native coronary artery without angina pectoris: Secondary | ICD-10-CM

## 2018-05-28 DIAGNOSIS — E782 Mixed hyperlipidemia: Secondary | ICD-10-CM

## 2018-05-28 MED ORDER — SILDENAFIL CITRATE 50 MG PO TABS: 50.0000 mg | ORAL_TABLET | Freq: Every day | ORAL | 3 refills | Status: DC | PRN

## 2018-05-28 NOTE — Patient Instructions (Addendum)
Medication Instructions:  Your physician has recommended you make the following change in your medication:  You may take Sildenafil 50 mg as needed for sexual intercourse NEVER take Nitroglycerin for 48 hours after Sildenafil  If you need a refill on your cardiac medications before your next appointment, please call your pharmacy.   Lab work: None Ordered   Testing/Procedures: None Ordered   Follow-Up: At [facility], you and your health needs are our priority.  As part of our continuing mission to provide you with exceptional heart care, we have created designated Provider Care Teams.  These Care Teams include your primary Cardiologist (physician) and Advanced Practice Providers (APPs -  Physician Assistants and Nurse Practitioners) who all work together to provide you with the care you need, when you need it. You will need a follow up appointment in:  6 months.  Please call our office 2 months in advance to schedule this appointment.  You may see Mertie Moores, MD or one of the following Advanced Practice Providers on your designated Care Team: Richardson Dopp, PA-C [place], [place] . Daune Perch, NP

## 2018-05-28 NOTE — Progress Notes (Signed)
Patrick [name] Date of Birth  07/01/1926 [place] [address]    [address]     [place], [place]  [id number]         Problems: 1. Coronary artery disease- status post CABG 1997.  2. Hyperlipidemia 3. Hypothyroidism     Patrick [name] is an 85 yo with the above noted hx.  He exercises every day ( walks for an hour a day).  He complains of fatigue when doing yard work.  He's not had any cardiac complaints.  August 30, 2012:  He still complains of generalized fatigue.   He has been having fatigued for the past several office visits.  He is really frustrated with his fatigue.  Questions whether or not it is due to his medications.  Oct. 23, 2014:  Patrick [name] is doing well.  Exercising regularly - energy is ok.    August 27, 2013:  Patrick [name] is doing ok.  He walks occasionally but not regularly.  He knows that he needs to get back into it.   Fatigues easily.    Aug. 30, 2016: Patrick [name] is doing well. No CP  Has had some night sweats - has  occurred several time Does his yard work without problems   Sept. 1, 2017: Doing well from a cardiac standpoint Has more fatigued than he would like He has had an episode of orthostatic hypotension.  Has developed a peripheral neuropathy in his toes. Was started on Vit B12 .   Dec. 8, 2017:  Patrick [name] was seen with wife, Patrick [name].   More fatigue recently  Does not exercise anymore.    Aug. 16. 2018  Patrick [name] is doing well  No CP or dyspnea.  Had an episode of generalized weakness after incercourse on one occasion  October 25, 2017:  Has developed peripheraph neuropathy. No angina .  No dyspnea.   Still very active.   May 28, 2018:  Still very active.    Discussed his ED issues.  Offered to give a script for sildenifil  No chest tightness.  He does have some shortness of breath when he is out walking around his yard.  [place] and gets short of breath when he is walking from [place] to  his [place].    Current Outpatient Medications on File Prior to Visit  Medication Sig Dispense Refill  . allopurinol (ZYLOPRIM) 300 MG tablet Take 300 mg by mouth daily.    Marland Kitchen aspirin 81 MG tablet Take 81 mg by mouth daily.      Marland Kitchen atorvastatin (LIPITOR) 20 MG tablet Take 1 tablet (20 mg total) by mouth daily. 90 tablet 3  . celecoxib (CELEBREX) 200 MG capsule Take 200 mg by mouth daily.     . cyanocobalamin 1000 MCG tablet Take 100 mcg by mouth daily.    Marland Kitchen donepezil (ARICEPT) 5 MG tablet TAKE 1 TABLET BY MOUTH AT BEDTIME FOR 1 MONTH  0  . gabapentin (NEURONTIN) 100 MG capsule Take 1 capsule (100 mg total) by mouth at bedtime. 90 capsule 3  . levothyroxine (SYNTHROID, LEVOTHROID) 112 MCG tablet Take 112 mcg by mouth daily before breakfast.    . losartan (COZAAR) 50 MG tablet Take 50 mg by mouth daily.    . metoprolol succinate (TOPROL-XL) 25 MG 24 hr tablet Take 25 mg by mouth daily.    . nitroGLYCERIN (NITROSTAT) 0.4 MG SL tablet PLACE 1 TABLET (  0.4 MG TOTAL) UNDER THE TONGUE EVERY 5 (FIVE) MINUTES AS NEEDED. 25 tablet 6  . pantoprazole (PROTONIX) 40 MG tablet Take 40 mg by mouth daily.     . tamsulosin (FLOMAX) 0.4 MG CAPS capsule Take 0.4 mg by mouth daily.    . Vitamin D, Ergocalciferol, (DRISDOL) 50000 units CAPS capsule Take 50,000 Units by mouth once a week.  3   No current facility-administered medications on file prior to visit.     Allergies  Allergen Reactions  . Halcion [Triazolam]     "Makes me crazy"  . Pravachol     Unknown    Past Medical History:  Diagnosis Date  . BPH (benign prostatic hypertrophy)   . Diverticulosis   . ED (erectile dysfunction)   . GERD (gastroesophageal reflux disease)   . Gout   . Hepatitis    hx hepatitis after mono as teenager  . History of kidney stones   . History of skin cancer   . Hyperlipidemia   . Hypertension   . Hypothyroidism   . IHD (ischemic heart disease)    Remote CABG in 1997  . MI, acute, non ST segment elevation  ([place]) 2010   s/p cath with occluded SVG to PD that fills by collaterals and the remainder of his revasculsarization is satisfactory. "the first time they did the stent , the second time they did the graft 8 vessels"  . Nocturia   . Urinary frequency     Past Surgical History:  Procedure Laterality Date  . APPENDECTOMY    . CARDIAC CATHETERIZATION  09/08/2008   NORMAL. EF 60%; Occluded SVG to PD that fills by collaterals and the remainder of his revascularization is satisfactory. he is managed medically.   . CORONARY ARTERY BYPASS GRAFT  1997   CABG x 8  . CYSTOSCOPY WITH INSERTION OF UROLIFT N/A 03/19/2015   Procedure: CYSTOSCOPY WITH INSERTION OF FOUR UROLIFTS;  Surgeon: Carolan Clines, MD;  Location: [facility];  Service: Urology;  Laterality: N/A;  . ESOPHAGOGASTRODUODENOSCOPY (EGD) WITH PROPOFOL N/A 02/12/2017   Procedure: ESOPHAGOGASTRODUODENOSCOPY (EGD) WITH PROPOFOL;  Surgeon: Otis Brace, MD;  [facility];  Service: Gastroenterology;  Laterality: N/A;    Social History   Tobacco Use  Smoking Status Former Smoker  . Packs/day: 1.00  . Years: 15.00  . Pack years: 15.00  . Types: Cigarettes  . Last attempt to quit: 05/15/1958  . Years since quitting: 60.0  Smokeless Tobacco Never Used    Social History   Substance and Sexual Activity  Alcohol Use Yes   Comment: occasional beer    Family History  Problem Relation Age of Onset  . Heart failure Mother 106    Reviw of Systems:  Reviewed in the HPI.  All other systems are negative.   Physical Exam: Blood pressure (!) 160/70, pulse 81, height 5\' 11"  (1.803 m), weight 167 lb 9.6 oz (76 kg), SpO2 96 %.  GEN:   Elderly male, NAD  HEENT: Normal NECK: No JVD; No carotid bruits LYMPHATICS: No lymphadenopathy CARDIAC: RRR   RESPIRATORY:  Clear to auscultation without rales, wheezing or rhonchi  ABDOMEN: Soft, non-tender, non-distended MUSCULOSKELETAL:  No edema; No deformity  SKIN: Warm and  dry NEUROLOGIC:  Alert and oriented x 3   ECG:    Assessment / Plan:   1. Coronary artery disease-   Doing well No angina . Is not taking NTG. Wants script for Sildenafil. I think it should work for him    2.  Hyperlipidemia-  Lipids in June look great  Will recheck in 6 months      3.  Leg pain/peripheral neuropathy:.  Is been found to have peripheral neuropathy.  His symptoms are not limiting at this point.  4.  Essential HTN:     BP is mildly elevated. Encouraged salt restriction     Mertie Moores, MD  05/28/2018 11:45 AM    [place] [address],  [place], [place]  [id number] Pager [phone] Phone: [phone]; Fax: [phone]

## 2018-05-30 DIAGNOSIS — R159 Full incontinence of feces: Secondary | ICD-10-CM | POA: Diagnosis not present

## 2018-05-30 DIAGNOSIS — R198 Other specified symptoms and signs involving the digestive system and abdomen: Secondary | ICD-10-CM | POA: Diagnosis not present

## 2018-05-30 DIAGNOSIS — K5900 Constipation, unspecified: Secondary | ICD-10-CM | POA: Diagnosis not present

## 2018-05-30 DIAGNOSIS — R150 Incomplete defecation: Secondary | ICD-10-CM | POA: Diagnosis not present

## 2018-06-22 ENCOUNTER — Other Ambulatory Visit: Payer: Self-pay | Admitting: Cardiovascular Disease

## 2018-07-13 ENCOUNTER — Ambulatory Visit (HOSPITAL_COMMUNITY)
Admission: RE | Admit: 2018-07-13 | Discharge: 2018-07-13 | Disposition: A | Discharge status: Home / Self Care | Payer: Medicare Other | Source: Ambulatory Visit | Attending: Gastroenterology | Admitting: Gastroenterology

## 2018-07-13 ENCOUNTER — Ambulatory Visit (HOSPITAL_COMMUNITY): Payer: Medicare Other | Admitting: Anesthesiology

## 2018-07-13 ENCOUNTER — Encounter (HOSPITAL_COMMUNITY): Payer: Self-pay | Admitting: Anesthesiology

## 2018-07-13 ENCOUNTER — Encounter (HOSPITAL_COMMUNITY)
Admission: RE | Disposition: A | Discharge status: Home / Self Care | Payer: Self-pay | Source: Ambulatory Visit | Attending: Gastroenterology

## 2018-07-13 DIAGNOSIS — I252 Old myocardial infarction: Secondary | ICD-10-CM | POA: Insufficient documentation

## 2018-07-13 DIAGNOSIS — Z87891 Personal history of nicotine dependence: Secondary | ICD-10-CM | POA: Insufficient documentation

## 2018-07-13 DIAGNOSIS — K449 Diaphragmatic hernia without obstruction or gangrene: Secondary | ICD-10-CM | POA: Diagnosis not present

## 2018-07-13 DIAGNOSIS — Z951 Presence of aortocoronary bypass graft: Secondary | ICD-10-CM | POA: Insufficient documentation

## 2018-07-13 DIAGNOSIS — T18108A Unspecified foreign body in esophagus causing other injury, initial encounter: Secondary | ICD-10-CM | POA: Diagnosis not present

## 2018-07-13 DIAGNOSIS — K269 Duodenal ulcer, unspecified as acute or chronic, without hemorrhage or perforation: Secondary | ICD-10-CM | POA: Insufficient documentation

## 2018-07-13 DIAGNOSIS — T18128A Food in esophagus causing other injury, initial encounter: Secondary | ICD-10-CM | POA: Diagnosis not present

## 2018-07-13 DIAGNOSIS — Z85828 Personal history of other malignant neoplasm of skin: Secondary | ICD-10-CM | POA: Insufficient documentation

## 2018-07-13 DIAGNOSIS — I10 Essential (primary) hypertension: Secondary | ICD-10-CM | POA: Insufficient documentation

## 2018-07-13 DIAGNOSIS — X58XXXA Exposure to other specified factors, initial encounter: Secondary | ICD-10-CM | POA: Diagnosis not present

## 2018-07-13 DIAGNOSIS — K219 Gastro-esophageal reflux disease without esophagitis: Secondary | ICD-10-CM | POA: Insufficient documentation

## 2018-07-13 DIAGNOSIS — I2510 Atherosclerotic heart disease of native coronary artery without angina pectoris: Secondary | ICD-10-CM | POA: Diagnosis not present

## 2018-07-13 DIAGNOSIS — K222 Esophageal obstruction: Secondary | ICD-10-CM | POA: Diagnosis not present

## 2018-07-13 HISTORY — PX: FOREIGN BODY REMOVAL: SHX962

## 2018-07-13 HISTORY — PX: ESOPHAGOGASTRODUODENOSCOPY (EGD) WITH PROPOFOL: SHX5813

## 2018-07-13 SURGERY — ESOPHAGOGASTRODUODENOSCOPY (EGD) WITH PROPOFOL
Anesthesia: Monitor Anesthesia Care

## 2018-07-13 MED ORDER — PROPOFOL 10 MG/ML IV BOLUS
INTRAVENOUS | Status: DC | PRN
  Administered 2018-07-13 (×3): 40 mg via INTRAVENOUS

## 2018-07-13 MED ORDER — LIDOCAINE 2% (20 MG/ML) 5 ML SYRINGE
INTRAMUSCULAR | Status: DC | PRN
  Administered 2018-07-13 (×2): 40 mg via INTRAVENOUS

## 2018-07-13 MED ORDER — PROPOFOL 10 MG/ML IV BOLUS
INTRAVENOUS | Status: AC
  Filled 2018-07-13: qty 60

## 2018-07-13 MED ORDER — LACTATED RINGERS IV SOLN
INTRAVENOUS | Status: AC | PRN
  Administered 2018-07-13: 1000 mL via INTRAVENOUS

## 2018-07-13 MED ORDER — SODIUM CHLORIDE 0.9 % IV SOLN: INTRAVENOUS | Status: DC

## 2018-07-13 SURGICAL SUPPLY — 15 items

## 2018-07-13 NOTE — Anesthesia Postprocedure Evaluation (Signed)
Anesthesia Post Note  Patient: [place] H [place]  Procedure(s) Performed: ESOPHAGOGASTRODUODENOSCOPY (EGD) WITH PROPOFOL (N/A ) FOREIGN BODY REMOVAL (N/A )     Patient location during evaluation: PACU Anesthesia Type: MAC Level of consciousness: awake and alert and oriented Pain management: pain level controlled Vital Signs Assessment: post-procedure vital signs reviewed and stable Respiratory status: spontaneous breathing, nonlabored ventilation and respiratory function stable Cardiovascular status: stable and blood pressure returned to baseline Postop Assessment: no apparent nausea or vomiting Anesthetic complications: no    Last Vitals:  Vitals:   07/13/18 1212 07/13/18 1224  BP: (!) 113/48 (!) 123/55  Pulse: 74 80  Resp: 16 18  Temp: 36.6 C   SpO2: 100% 100%    Last Pain:  Vitals:   07/13/18 1212  TempSrc: Oral  PainSc: 0-No pain                 Contrell Ballentine A.

## 2018-07-13 NOTE — Anesthesia Procedure Notes (Signed)
Procedure Name: MAC Date/Time: 07/13/2018 11:52 AM Performed by: Cynda Familia, CRNA Pre-anesthesia Checklist: Patient identified, Emergency Drugs available, Suction available, Patient being monitored and Timeout performed Patient Re-evaluated:Patient Re-evaluated prior to induction Oxygen Delivery Method: Nasal cannula Placement Confirmation: positive ETCO2 and breath sounds checked- equal and bilateral Dental Injury: Teeth and Oropharynx as per pre-operative assessment

## 2018-07-13 NOTE — Op Note (Signed)
[facility] Patient Name: Patrick [name] Procedure Date: 07/13/2018 MRN: [id number] Attending MD: Clarene Essex , MD Date of Birth: 27-Feb-1927 CSN: [id number] Age: 85 Admit Type: Outpatient Procedure:                Upper GI endoscopy Indications:              Foreign body in the esophagus Providers:                Clarene Essex, MD, Carlyn Reichert, RN, Laverda Sorenson,                            Technician, Glenis Smoker, CRNA Referring MD:              Medicines:                Propofol total dose 125 mg IV, 80 mg IV lidocaine Complications:            No immediate complications. Estimated Blood Loss:     Estimated blood loss: none. Procedure:                Pre-Anesthesia Assessment:                           - Prior to the procedure, a History and Physical                            was performed, and patient medications and                            allergies were reviewed. The patient's tolerance of                            previous anesthesia was also reviewed. The risks                            and benefits of the procedure and the sedation                            options and risks were discussed with the patient.                            All questions were answered, and informed consent                            was obtained. Prior Anticoagulants: The patient has                            taken no previous anticoagulant or antiplatelet                            agents. ASA Grade Assessment: II - A patient with                            mild systemic disease. After reviewing the risks  and benefits, the patient was deemed in                            satisfactory condition to undergo the procedure.                           After obtaining informed consent, the endoscope was                            passed under direct vision. Throughout the                            procedure, the patient's blood pressure, pulse, and                       oxygen saturations were monitored continuously. The                            GIF-H190 ([id number]) Olympus gastroscope was                            introduced through the mouth, and advanced to the                            second part of duodenum. The upper GI endoscopy was                            accomplished without difficulty. The patient                            tolerated the procedure well. Scope In: Scope Out: Findings:      The larynx was normal.      Food was found in the lower third of the esophagus. Removal of food was       accomplished. Removal of food was accomplished. Using the snare x3 and       removing the pieces and the scope x3 as well      A medium-sized hiatal hernia was present.      One benign-appearing, intrinsic mild stenosis was found. The stenosis       was traversed.      The entire examined stomach was normal.      The duodenal bulb was normal.      Few non-bleeding superficial duodenal ulcers were found in the second       portion of the duodenum.      The exam was otherwise without abnormality. Impression:               - Normal larynx.                           - Food in the lower third of the esophagus. Removal                            was successful.                           - Medium-sized  hiatal hernia.                           - Benign-appearing esophageal stenosis.                           - Normal stomach.                           - Normal duodenal bulb.                           - Multiple non-bleeding duodenal ulcers.                           - The examination was otherwise normal. Moderate Sedation:      Not Applicable - Patient had care per Anesthesia. Recommendation:           - Patient has a contact number available for                            emergencies. The signs and symptoms of potential                            delayed complications were discussed with the                            patient.  Return to normal activities tomorrow.                            Written discharge instructions were provided to the                            patient.                           - Full liquid diet for 4 hours. Then have soft                            solids for the rest of the day                           - Continue present medications.                           - Return to GI clinic in 2-3 weeks.                           - Telephone GI clinic if symptomatic PRN. Procedure Code(s):        --- Professional ---                           [phone], Esophagogastroduodenoscopy, flexible,                            transoral; with removal of foreign body(s) Diagnosis Code(s):        ---  Professional ---                           S17.793J, Food in esophagus causing other injury,                            initial encounter                           K44.9, Diaphragmatic hernia without obstruction or                            gangrene                           K22.2, Esophageal obstruction                           K26.9, Duodenal ulcer, unspecified as acute or                            chronic, without hemorrhage or perforation                           T18.108A, Unspecified foreign body in esophagus                            causing other injury, initial encounter CPT copyright 2018 [employer]. All rights reserved. The codes documented in this report are preliminary and upon coder review may  be revised to meet current compliance requirements. Clarene Essex, MD 07/13/2018 12:21:11 PM This report has been signed electronically. Number of Addenda: 0

## 2018-07-13 NOTE — Consult Note (Signed)
Reason for Consult: Food impaction Referring Physician: None  Patrick [name] is an 85 y.o. male.  HPI: [name] called me this morning and said her husband has had a food impaction since last night and he was eating roast beef and he has had this before in his hospital computer chart and previous endoscopy was reviewed he did have a dilation a few months after his previous food impaction has not had any problems with the endoscopy and has no other specific complaints  Past Medical History:  Diagnosis Date  . BPH (benign prostatic hypertrophy)   . Diverticulosis   . ED (erectile dysfunction)   . GERD (gastroesophageal reflux disease)   . Gout   . Hepatitis    hx hepatitis after mono as teenager  . History of kidney stones   . History of skin cancer   . Hyperlipidemia   . Hypertension   . Hypothyroidism   . IHD (ischemic heart disease)    Remote CABG in 1997  . MI, acute, non ST segment elevation ([place]) 2010   s/p cath with occluded SVG to PD that fills by collaterals and the remainder of his revasculsarization is satisfactory. "the first time they did the stent , the second time they did the graft 8 vessels"  . Nocturia   . Urinary frequency     Past Surgical History:  Procedure Laterality Date  . APPENDECTOMY    . CARDIAC CATHETERIZATION  09/08/2008   NORMAL. EF 60%; Occluded SVG to PD that fills by collaterals and the remainder of his revascularization is satisfactory. he is managed medically.   . CORONARY ARTERY BYPASS GRAFT  1997   CABG x 8  . CYSTOSCOPY WITH INSERTION OF UROLIFT N/A 03/19/2015   Procedure: CYSTOSCOPY WITH INSERTION OF FOUR UROLIFTS;  Surgeon: Carolan Clines, MD;  Location: [facility];  Service: Urology;  Laterality: N/A;  . ESOPHAGOGASTRODUODENOSCOPY (EGD) WITH PROPOFOL N/A 02/12/2017   Procedure: ESOPHAGOGASTRODUODENOSCOPY (EGD) WITH PROPOFOL;  Surgeon: Otis Brace, MD;  Location: [facility];  Service: Gastroenterology;  Laterality: N/A;     Family History  Problem Relation Age of Onset  . Heart failure Mother 77    Social History:  reports that he quit smoking about 60 years ago. His smoking use included cigarettes. He has a 15.00 pack-year smoking history. He has never used smokeless tobacco. He reports current alcohol use. He reports that he does not use drugs.  Allergies:  Allergies  Allergen Reactions  . Halcion [Triazolam]     "Makes me crazy"  . Pravachol     Unknown    Medications: I have reviewed the patient's current medications.  No results found for this or any previous visit (from the past 48 hour(s)).  No results found.  ROSThere were no vitals taken for this visit. Physical Exam Vital signs stable afebrile no acute distress exam please see preassessment evaluation Assessment/Plan: Food impaction in patient with history of hiatal hernia and ring and previous food impaction and dilation Plan: We discussed endoscopy including the risks with the patient and his wife and will proceed today with anesthesia assistance with further work-up and plans pending those findings  [place] E 07/13/2018, 10:34 AM

## 2018-07-13 NOTE — Anesthesia Preprocedure Evaluation (Addendum)
Anesthesia Evaluation  Patient identified by MRN, date of birth, ID band Patient awake    Reviewed: Allergy & Precautions, NPO status , Patient's Chart, lab work & pertinent test results, reviewed documented beta blocker date and time   Airway Mallampati: II  TM Distance: >3 FB Neck ROM: Full    Dental no notable dental hx. (+) Teeth Intact   Pulmonary former smoker,    Pulmonary exam normal breath sounds clear to auscultation       Cardiovascular hypertension, Pt. on medications and Pt. on home beta blockers + CAD, + Past MI, + Cardiac Stents and + CABG  Normal cardiovascular exam Rhythm:Regular Rate:Normal  LVEF 65% EKG - inferior wall MI Myocardial Perfusion Imaging 03/07/2018  Nuclear stress EF: 66%.  There was no ST segment deviation noted during stress.  Defect 1: There is a small defect of mild severity present in the basal inferolateral location.  This is a low risk study.  Findings consistent with ischemia.  The left ventricular ejection fraction is hyperdynamic (>65%).   Mildly abnormal, low risk stress nuclear study with very mild ischemia in the basal inferolateral wall.  Gated ejection fraction 66% with normal wall motion.   Neuro/Psych On ariceptnegative neurological ROS     GI/Hepatic GERD  Medicated and Controlled,(+) Hepatitis -, Epstein-BarrEsophageal foreign body- food impaction   Endo/Other  Hypothyroidism Hyperlipidemia  Renal/GU   negative genitourinary   Musculoskeletal  (+) Arthritis , Osteoarthritis,  Gout   Abdominal   Peds  Hematology negative hematology ROS (+)   Anesthesia Other Findings   Reproductive/Obstetrics                            Anesthesia Physical Anesthesia Plan  ASA: III  Anesthesia Plan: MAC   Post-op Pain Management:    Induction: Intravenous  PONV Risk Score and Plan: 1 and Propofol infusion, Ondansetron and Treatment may vary  due to age or medical condition  Airway Management Planned: Natural Airway  Additional Equipment:   Intra-op Plan:   Post-operative Plan:   Informed Consent: I have reviewed the patients History and Physical, chart, labs and discussed the procedure including the risks, benefits and alternatives for the proposed anesthesia with the patient or authorized representative who has indicated his/her understanding and acceptance.     Dental advisory given  Plan Discussed with: CRNA and Surgeon  Anesthesia Plan Comments:        Anesthesia Quick Evaluation

## 2018-07-13 NOTE — Transfer of Care (Signed)
Immediate Anesthesia Transfer of Care Note  Patient: [place] H Thaw  Procedure(s) Performed: ESOPHAGOGASTRODUODENOSCOPY (EGD) WITH PROPOFOL (N/A ) FOREIGN BODY REMOVAL (N/A )  Patient Location: PACU and Endoscopy Unit  Anesthesia Type:MAC  Level of Consciousness: sedated  Airway & Oxygen Therapy: Patient Spontanous Breathing and Patient connected to nasal cannula oxygen  Post-op Assessment: Report given to RN and Post -op Vital signs reviewed and stable  Post vital signs: Reviewed and stable  Last Vitals:  Vitals Value Taken Time  BP    Temp    Pulse 74 07/13/2018 12:13 PM  Resp 16 07/13/2018 12:13 PM  SpO2 100 % 07/13/2018 12:13 PM  Vitals shown include unvalidated device data.  Last Pain:  Vitals:   07/13/18 1044  TempSrc: Oral  PainSc: 0-No pain         Complications: No apparent anesthesia complications

## 2018-07-13 NOTE — Discharge Instructions (Signed)
YOU HAD AN ENDOSCOPIC PROCEDURE TODAY: Refer to the procedure report and other information in the discharge instructions given to you for any specific questions about what was found during the examination. If this information does not answer your questions, please call [facility] office at [phone] to clarify.   YOU SHOULD EXPECT: Some feelings of bloating in the abdomen. Passage of more gas than usual. Walking can help get rid of the air that was put into your GI tract during the procedure and reduce the bloating. If you had a lower endoscopy (such as a colonoscopy or flexible sigmoidoscopy) you may notice spotting of blood in your stool or on the toilet paper. Some abdominal soreness may be present for a day or two, also.  DIET: Your first meal following the procedure should be a light meal and then it is ok to progress to your normal diet. A half-sandwich or bowl of soup is an example of a good first meal. Heavy or fried foods are harder to digest and may make you feel nauseous or bloated. Drink plenty of fluids but you should avoid alcoholic beverages for 24 hours. If you had a esophageal dilation, please see attached instructions for diet.   ACTIVITY: Your care partner should take you home directly after the procedure. You should plan to take it easy, moving slowly for the rest of the day. You can resume normal activity the day after the procedure however YOU SHOULD NOT DRIVE, use power tools, machinery or perform tasks that involve climbing or major physical exertion for 24 hours (because of the sedation medicines used during the test).   SYMPTOMS TO REPORT IMMEDIATELY: A gastroenterologist can be reached at any hour. Please call [phone]  for any of the following symptoms:   Following lower endoscopy (colonoscopy, flexible sigmoidoscopy) Excessive amounts of blood in the stool  Significant tenderness, worsening of abdominal pains  Swelling of the abdomen that is new, acute  Fever of 100  or higher   Following upper endoscopy (EGD, EUS, ERCP, esophageal dilation) Vomiting of blood or coffee ground material  New, significant abdominal pain  New, significant chest pain or pain under the shoulder blades  Painful or persistently difficult swallowing  New shortness of breath  Black, tarry-looking or red, bloody stools  FOLLOW UP:  If any biopsies were taken you will be contacted by phone or by letter within the next 1-3 weeks. Call [phone]  if you have not heard about the biopsies in 3 weeks.  Please also call with any specific questions about appointments or follow up tests. Call if question or problem otherwise liquids only first meal today and then have soft solids the rest of today and remember to chew your food well eat slowly drink plenty of liquids and follow-up with me or Buccini in a few weeks

## 2018-07-15 ENCOUNTER — Encounter (HOSPITAL_COMMUNITY): Payer: Self-pay | Admitting: Gastroenterology

## 2018-07-30 ENCOUNTER — Other Ambulatory Visit: Payer: Self-pay | Admitting: Gastroenterology

## 2018-07-30 DIAGNOSIS — R1310 Dysphagia, unspecified: Secondary | ICD-10-CM

## 2018-07-30 DIAGNOSIS — R198 Other specified symptoms and signs involving the digestive system and abdomen: Secondary | ICD-10-CM | POA: Diagnosis not present

## 2018-07-30 DIAGNOSIS — R150 Incomplete defecation: Secondary | ICD-10-CM | POA: Diagnosis not present

## 2018-07-30 DIAGNOSIS — K5900 Constipation, unspecified: Secondary | ICD-10-CM | POA: Diagnosis not present

## 2018-08-01 ENCOUNTER — Other Ambulatory Visit: Payer: Medicare Other

## 2018-08-01 DIAGNOSIS — Z85828 Personal history of other malignant neoplasm of skin: Secondary | ICD-10-CM | POA: Diagnosis not present

## 2018-08-01 DIAGNOSIS — L570 Actinic keratosis: Secondary | ICD-10-CM | POA: Diagnosis not present

## 2018-08-01 DIAGNOSIS — D692 Other nonthrombocytopenic purpura: Secondary | ICD-10-CM | POA: Diagnosis not present

## 2018-08-27 DIAGNOSIS — R1314 Dysphagia, pharyngoesophageal phase: Secondary | ICD-10-CM | POA: Diagnosis not present

## 2018-08-30 ENCOUNTER — Telehealth: Payer: Self-pay | Admitting: Nurse Practitioner

## 2018-08-30 NOTE — Telephone Encounter (Signed)
Received message from patient's wife regarding bruising - concern for etiology - ? Meds/age.   He is on aspirin and celebrex - this along with his age are contributing factors.   She was appreciative for the input.   Burtis Junes, RN, [place] [address] [place], [place]  [phone]

## 2018-09-27 DIAGNOSIS — H16223 Keratoconjunctivitis sicca, not specified as Sjogren's, bilateral: Secondary | ICD-10-CM | POA: Diagnosis not present

## 2018-09-27 DIAGNOSIS — H532 Diplopia: Secondary | ICD-10-CM | POA: Diagnosis not present

## 2018-10-18 DIAGNOSIS — H532 Diplopia: Secondary | ICD-10-CM | POA: Diagnosis not present

## 2018-10-23 DIAGNOSIS — Z6822 Body mass index (BMI) 22.0-22.9, adult: Secondary | ICD-10-CM | POA: Diagnosis not present

## 2018-10-23 DIAGNOSIS — G609 Hereditary and idiopathic neuropathy, unspecified: Secondary | ICD-10-CM | POA: Diagnosis not present

## 2018-10-23 DIAGNOSIS — G3184 Mild cognitive impairment, so stated: Secondary | ICD-10-CM | POA: Diagnosis not present

## 2018-10-23 DIAGNOSIS — E039 Hypothyroidism, unspecified: Secondary | ICD-10-CM | POA: Diagnosis not present

## 2018-10-23 DIAGNOSIS — E538 Deficiency of other specified B group vitamins: Secondary | ICD-10-CM | POA: Diagnosis not present

## 2018-10-23 DIAGNOSIS — R634 Abnormal weight loss: Secondary | ICD-10-CM | POA: Diagnosis not present

## 2018-10-23 DIAGNOSIS — E7849 Other hyperlipidemia: Secondary | ICD-10-CM | POA: Diagnosis not present

## 2018-10-23 DIAGNOSIS — F329 Major depressive disorder, single episode, unspecified: Secondary | ICD-10-CM | POA: Diagnosis not present

## 2018-10-23 DIAGNOSIS — D696 Thrombocytopenia, unspecified: Secondary | ICD-10-CM | POA: Diagnosis not present

## 2018-10-23 DIAGNOSIS — K5909 Other constipation: Secondary | ICD-10-CM | POA: Diagnosis not present

## 2018-11-14 ENCOUNTER — Other Ambulatory Visit: Payer: Self-pay

## 2018-11-14 ENCOUNTER — Inpatient Hospital Stay (HOSPITAL_COMMUNITY): Payer: Medicare Other

## 2018-11-14 ENCOUNTER — Inpatient Hospital Stay (HOSPITAL_COMMUNITY)
Admission: EM | Admit: 2018-11-14 | Discharge: 2018-11-19 | DRG: 247 | Disposition: A | Discharge status: Home / Self Care | Payer: Medicare Other | Attending: Cardiovascular Disease | Admitting: Cardiovascular Disease

## 2018-11-14 ENCOUNTER — Telehealth: Payer: Self-pay | Admitting: Nurse Practitioner

## 2018-11-14 DIAGNOSIS — K5790 Diverticulosis of intestine, part unspecified, without perforation or abscess without bleeding: Secondary | ICD-10-CM | POA: Diagnosis present

## 2018-11-14 DIAGNOSIS — D696 Thrombocytopenia, unspecified: Secondary | ICD-10-CM | POA: Diagnosis present

## 2018-11-14 DIAGNOSIS — Z951 Presence of aortocoronary bypass graft: Secondary | ICD-10-CM | POA: Diagnosis not present

## 2018-11-14 DIAGNOSIS — N400 Enlarged prostate without lower urinary tract symptoms: Secondary | ICD-10-CM | POA: Diagnosis present

## 2018-11-14 DIAGNOSIS — Z85828 Personal history of other malignant neoplasm of skin: Secondary | ICD-10-CM

## 2018-11-14 DIAGNOSIS — Z8249 Family history of ischemic heart disease and other diseases of the circulatory system: Secondary | ICD-10-CM

## 2018-11-14 DIAGNOSIS — I25110 Atherosclerotic heart disease of native coronary artery with unstable angina pectoris: Principal | ICD-10-CM | POA: Diagnosis present

## 2018-11-14 DIAGNOSIS — E0590 Thyrotoxicosis, unspecified without thyrotoxic crisis or storm: Secondary | ICD-10-CM | POA: Diagnosis not present

## 2018-11-14 DIAGNOSIS — E785 Hyperlipidemia, unspecified: Secondary | ICD-10-CM | POA: Diagnosis present

## 2018-11-14 DIAGNOSIS — I10 Essential (primary) hypertension: Secondary | ICD-10-CM | POA: Diagnosis not present

## 2018-11-14 DIAGNOSIS — Z7982 Long term (current) use of aspirin: Secondary | ICD-10-CM | POA: Diagnosis not present

## 2018-11-14 DIAGNOSIS — Z955 Presence of coronary angioplasty implant and graft: Secondary | ICD-10-CM | POA: Diagnosis not present

## 2018-11-14 DIAGNOSIS — I252 Old myocardial infarction: Secondary | ICD-10-CM | POA: Diagnosis not present

## 2018-11-14 DIAGNOSIS — E039 Hypothyroidism, unspecified: Secondary | ICD-10-CM | POA: Diagnosis not present

## 2018-11-14 DIAGNOSIS — Z79890 Hormone replacement therapy (postmenopausal): Secondary | ICD-10-CM

## 2018-11-14 DIAGNOSIS — Z87891 Personal history of nicotine dependence: Secondary | ICD-10-CM | POA: Diagnosis not present

## 2018-11-14 DIAGNOSIS — F0390 Unspecified dementia without behavioral disturbance: Secondary | ICD-10-CM | POA: Diagnosis present

## 2018-11-14 DIAGNOSIS — M109 Gout, unspecified: Secondary | ICD-10-CM | POA: Diagnosis not present

## 2018-11-14 DIAGNOSIS — Z888 Allergy status to other drugs, medicaments and biological substances status: Secondary | ICD-10-CM

## 2018-11-14 DIAGNOSIS — Z1159 Encounter for screening for other viral diseases: Secondary | ICD-10-CM | POA: Diagnosis not present

## 2018-11-14 DIAGNOSIS — R079 Chest pain, unspecified: Secondary | ICD-10-CM

## 2018-11-14 DIAGNOSIS — I200 Unstable angina: Secondary | ICD-10-CM | POA: Diagnosis not present

## 2018-11-14 DIAGNOSIS — K219 Gastro-esophageal reflux disease without esophagitis: Secondary | ICD-10-CM | POA: Diagnosis present

## 2018-11-14 DIAGNOSIS — I351 Nonrheumatic aortic (valve) insufficiency: Secondary | ICD-10-CM | POA: Diagnosis not present

## 2018-11-14 LAB — COMPREHENSIVE METABOLIC PANEL
ALT: 32 U/L (ref 0–44)
AST: 29 U/L (ref 15–41)
Albumin: 4.3 g/dL (ref 3.5–5.0)
Alkaline Phosphatase: 74 U/L (ref 38–126)
Anion gap: 9 (ref 5–15)
BUN: 22 mg/dL (ref 8–23)
CO2: 24 mmol/L (ref 22–32)
Calcium: 9.1 mg/dL (ref 8.9–10.3)
Chloride: 104 mmol/L (ref 98–111)
Creatinine, Ser: 1.21 mg/dL (ref 0.61–1.24)
GFR calc Af Amer: >60 mL/min (ref >60)
GFR calc non Af Amer: 53 mL/min — ABNORMAL LOW (ref >60)
Glucose, Bld: 107 mg/dL — ABNORMAL HIGH (ref 70–99)
Potassium: 4.2 mmol/L (ref 3.5–5.1)
Sodium: 137 mmol/L (ref 135–145)
Total Bilirubin: 1.1 mg/dL (ref 0.3–1.2)
Total Protein: 6.8 g/dL (ref 6.5–8.1)

## 2018-11-14 LAB — CBC WITH DIFFERENTIAL/PLATELET
Abs Immature Granulocytes: 0.09 10*3/uL — ABNORMAL HIGH (ref 0.00–0.07)
Basophils Absolute: 0 10*3/uL (ref 0.0–0.1)
Basophils Relative: 0 %
Eosinophils Absolute: 0 10*3/uL (ref 0.0–0.5)
Eosinophils Relative: 0 %
HCT: 40.7 % (ref 39.0–52.0)
Hemoglobin: 13.4 g/dL (ref 13.0–17.0)
Immature Granulocytes: 2 %
Lymphocytes Relative: 21 %
Lymphs Abs: 1 10*3/uL (ref 0.7–4.0)
MCH: 32 pg (ref 26.0–34.0)
MCHC: 32.9 g/dL (ref 30.0–36.0)
MCV: 97.1 fL (ref 80.0–100.0)
Monocytes Absolute: 1.2 10*3/uL — ABNORMAL HIGH (ref 0.1–1.0)
Monocytes Relative: 25 %
Neutro Abs: 2.6 10*3/uL (ref 1.7–7.7)
Neutrophils Relative %: 52 %
Platelets: 100 10*3/uL — ABNORMAL LOW (ref 150–400)
RBC: 4.19 MIL/uL — ABNORMAL LOW (ref 4.22–5.81)
RDW: 12.7 % (ref 11.5–15.5)
WBC: 5 10*3/uL (ref 4.0–10.5)
nRBC: 0 % (ref 0.0–0.2)

## 2018-11-14 LAB — BRAIN NATRIURETIC PEPTIDE: B Natriuretic Peptide: 82 pg/mL (ref 0.0–100.0)

## 2018-11-14 LAB — TROPONIN I (HIGH SENSITIVITY)
Troponin I (High Sensitivity): 8 ng/L (ref <18)
Troponin I (High Sensitivity): 8 ng/L (ref <18)

## 2018-11-14 LAB — D-DIMER, QUANTITATIVE (NOT AT ARMC): D-Dimer, Quant: 0.38 ug/mL-FEU (ref 0.00–0.50)

## 2018-11-14 LAB — LIPASE, BLOOD: Lipase: 34 U/L (ref 11–51)

## 2018-11-14 MED ORDER — ASPIRIN EC 81 MG PO TBEC
81.0000 mg | DELAYED_RELEASE_TABLET | Freq: Every day | ORAL | Status: AC
  Administered 2018-11-15 – 2018-11-17 (×3): 81 mg via ORAL
  Filled 2018-11-14 (×3): qty 1

## 2018-11-14 MED ORDER — CELECOXIB 200 MG PO CAPS
200.0000 mg | ORAL_CAPSULE | Freq: Every day | ORAL | Status: DC
  Administered 2018-11-14 – 2018-11-17 (×4): 200 mg via ORAL
  Filled 2018-11-14 (×5): qty 1

## 2018-11-14 MED ORDER — ALLOPURINOL 300 MG PO TABS
300.0000 mg | ORAL_TABLET | Freq: Every day | ORAL | Status: DC
  Administered 2018-11-14 – 2018-11-19 (×6): 300 mg via ORAL
  Filled 2018-11-14 (×6): qty 1

## 2018-11-14 MED ORDER — NITROGLYCERIN 0.4 MG SL SUBL: 0.4000 mg | SUBLINGUAL_TABLET | SUBLINGUAL | Status: DC | PRN

## 2018-11-14 MED ORDER — HEPARIN BOLUS VIA INFUSION
4000.0000 [IU] | Freq: Once | INTRAVENOUS | Status: AC
  Administered 2018-11-14: 4000 [IU] via INTRAVENOUS
  Filled 2018-11-14: qty 4000

## 2018-11-14 MED ORDER — ASPIRIN 300 MG RE SUPP: 300.0000 mg | RECTAL | Status: AC

## 2018-11-14 MED ORDER — ASPIRIN 81 MG PO TABS: 81.0000 mg | ORAL_TABLET | Freq: Every day | ORAL | Status: DC

## 2018-11-14 MED ORDER — METOPROLOL SUCCINATE ER 25 MG PO TB24
25.0000 mg | ORAL_TABLET | Freq: Every day | ORAL | Status: DC
  Administered 2018-11-14 – 2018-11-19 (×6): 25 mg via ORAL
  Filled 2018-11-14 (×6): qty 1

## 2018-11-14 MED ORDER — GABAPENTIN 100 MG PO CAPS
100.0000 mg | ORAL_CAPSULE | Freq: Every day | ORAL | Status: DC
  Administered 2018-11-14 – 2018-11-18 (×5): 100 mg via ORAL
  Filled 2018-11-14 (×5): qty 1

## 2018-11-14 MED ORDER — ACETAMINOPHEN 325 MG PO TABS
650.0000 mg | ORAL_TABLET | ORAL | Status: DC | PRN
  Administered 2018-11-17: 650 mg via ORAL
  Filled 2018-11-14: qty 2

## 2018-11-14 MED ORDER — TAMSULOSIN HCL 0.4 MG PO CAPS
0.4000 mg | ORAL_CAPSULE | Freq: Every day | ORAL | Status: DC
  Administered 2018-11-14 – 2018-11-19 (×6): 0.4 mg via ORAL
  Filled 2018-11-14 (×6): qty 1

## 2018-11-14 MED ORDER — ATORVASTATIN CALCIUM 10 MG PO TABS
20.0000 mg | ORAL_TABLET | Freq: Every day | ORAL | Status: DC
  Administered 2018-11-14 – 2018-11-19 (×6): 20 mg via ORAL
  Filled 2018-11-14 (×6): qty 2

## 2018-11-14 MED ORDER — VITAMIN D (ERGOCALCIFEROL) 1.25 MG (50000 UNIT) PO CAPS
50000.0000 [IU] | ORAL_CAPSULE | ORAL | Status: DC
  Administered 2018-11-18: 50000 [IU] via ORAL
  Filled 2018-11-14: qty 1

## 2018-11-14 MED ORDER — ASPIRIN 81 MG PO CHEW
324.0000 mg | CHEWABLE_TABLET | ORAL | Status: AC
  Administered 2018-11-14: 324 mg via ORAL
  Filled 2018-11-14: qty 4

## 2018-11-14 MED ORDER — ONDANSETRON HCL 4 MG/2ML IJ SOLN: 4.0000 mg | Freq: Four times a day (QID) | INTRAMUSCULAR | Status: DC | PRN

## 2018-11-14 MED ORDER — LOSARTAN POTASSIUM 50 MG PO TABS
50.0000 mg | ORAL_TABLET | Freq: Every day | ORAL | Status: DC
  Administered 2018-11-14 – 2018-11-19 (×6): 50 mg via ORAL
  Filled 2018-11-14 (×6): qty 1

## 2018-11-14 MED ORDER — HEPARIN (PORCINE) 25000 UT/250ML-% IV SOLN
1000.0000 [IU]/h | INTRAVENOUS | Status: DC
  Administered 2018-11-14: 900 [IU]/h via INTRAVENOUS
  Administered 2018-11-17: 1000 [IU]/h via INTRAVENOUS
  Filled 2018-11-14 (×4): qty 250

## 2018-11-14 MED ORDER — VITAMIN B-12 100 MCG PO TABS
100.0000 ug | ORAL_TABLET | Freq: Every day | ORAL | Status: DC
  Administered 2018-11-14 – 2018-11-19 (×6): 100 ug via ORAL
  Filled 2018-11-14 (×6): qty 1

## 2018-11-14 MED ORDER — SODIUM CHLORIDE 0.9 % IV SOLN
INTRAVENOUS | Status: DC
  Administered 2018-11-14: 22:00:00 via INTRAVENOUS

## 2018-11-14 MED ORDER — LEVOTHYROXINE SODIUM 112 MCG PO TABS
112.0000 ug | ORAL_TABLET | Freq: Every day | ORAL | Status: DC
  Administered 2018-11-15: 112 ug via ORAL
  Filled 2018-11-14: qty 1

## 2018-11-14 MED ORDER — DONEPEZIL HCL 5 MG PO TABS
5.0000 mg | ORAL_TABLET | Freq: Every day | ORAL | Status: DC
  Administered 2018-11-14 – 2018-11-18 (×5): 5 mg via ORAL
  Filled 2018-11-14 (×5): qty 1

## 2018-11-14 MED ORDER — PANTOPRAZOLE SODIUM 40 MG PO TBEC
40.0000 mg | DELAYED_RELEASE_TABLET | Freq: Every day | ORAL | Status: DC
  Administered 2018-11-14 – 2018-11-19 (×6): 40 mg via ORAL
  Filled 2018-11-14 (×6): qty 1

## 2018-11-14 NOTE — ED Notes (Signed)
[place] pts wife wants update

## 2018-11-14 NOTE — Telephone Encounter (Signed)
Wife [name] reached out to me this afternoon by phone. I.H. has developed exertional chest discomfort and progressive DOE. He has not been doing well over the past month due to extreme fatigue and DOE. He has not been able to do basic activities without developing extreme DOE.   He was short of breath just with conversation on the phone.   He was advised to go to the [facility] for further evaluation.   Burtis Junes, RN, [place] [address] [place], [place]  [phone]

## 2018-11-14 NOTE — H&P (Signed)
Cardiology Admission History and Physical:   Patient ID: Patrick [name] MRN: [id number]; DOB: 10/07/1926   Admission date: 11/14/2018  Primary Care Provider: Marton Redwood, MD Primary Cardiologist: Mertie Moores, MD  Primary Electrophysiologist:  None   Chief Complaint:   Chest pain , dyspnea   Patient Profile:   Patrick Rica H Sells is a 85 y.o. male with history of coronary artery disease-status post coronary artery bypass grafting.  He also has a history of hypertension, hyperlipidemia and gastroesophageal reflux disease.  He presents to the emergency room after developing chest pain last night.    History of Present Illness:    Patrick [name] is a 85 y.o. male with history of coronary artery disease-status post coronary artery bypass grafting.  He also has a history of hypertension, hyperlipidemia and gastroesophageal reflux disease.  He presents to the emergency room after developing chest pain last night.  The pain was a central chest pain.  There was no radiation.  It was a mild ache.  He got up and walked around and the pain resolved.  He woke up this morning with the same discomfort.  It lasted approximately 30 minutes.    EKG here in the emergency room reveals normal sinus rhythm at a rate of 64.  He has a previous anterior wall myocardial infarction.  There are no acute ST or T wave changes.  Upon questioning, his cp is a dull pressure , ache sensation , mid sternal. No radiation.   No sweats  Woke up with the pain on 2 occasions.   Main complaint is progressive weakness.  Last cath was 2010 ( tennant)    Heart Pathway Score:     Past Medical History:  Diagnosis Date  . BPH (benign prostatic hypertrophy)   . Diverticulosis   . ED (erectile dysfunction)   . GERD (gastroesophageal reflux disease)   . Gout   . Hepatitis    hx hepatitis after mono as teenager  . History of kidney stones   . History of skin cancer   . Hyperlipidemia   . Hypertension   .  Hypothyroidism   . IHD (ischemic heart disease)    Remote CABG in 1997  . MI, acute, non ST segment elevation ([place]) 2010   s/p cath with occluded SVG to PD that fills by collaterals and the remainder of his revasculsarization is satisfactory. "the first time they did the stent , the second time they did the graft 8 vessels"  . Nocturia   . Urinary frequency     Past Surgical History:  Procedure Laterality Date  . APPENDECTOMY    . CARDIAC CATHETERIZATION  09/08/2008   NORMAL. EF 60%; Occluded SVG to PD that fills by collaterals and the remainder of his revascularization is satisfactory. he is managed medically.   . CORONARY ARTERY BYPASS GRAFT  1997   CABG x 8  . CYSTOSCOPY WITH INSERTION OF UROLIFT N/A 03/19/2015   Procedure: CYSTOSCOPY WITH INSERTION OF FOUR UROLIFTS;  Surgeon: Carolan Clines, MD;  Location: [facility];  Service: Urology;  Laterality: N/A;  . ESOPHAGOGASTRODUODENOSCOPY (EGD) WITH PROPOFOL N/A 02/12/2017   Procedure: ESOPHAGOGASTRODUODENOSCOPY (EGD) WITH PROPOFOL;  Surgeon: Otis Brace, MD;  [facility];  Service: Gastroenterology;  Laterality: N/A;  . ESOPHAGOGASTRODUODENOSCOPY (EGD) WITH PROPOFOL N/A 07/13/2018   Procedure: ESOPHAGOGASTRODUODENOSCOPY (EGD) WITH PROPOFOL;  Surgeon: Clarene Essex, MD;  [facility];  Service: Endoscopy;  Laterality: N/A;  . FOREIGN BODY REMOVAL N/A 07/13/2018   Procedure: FOREIGN BODY REMOVAL;  Surgeon: Clarene Essex, MD;  Location: [facility];  Service: Endoscopy;  Laterality: N/A;     Medications Prior to Admission: Prior to Admission medications   Medication Sig Start Date End Date Taking? Authorizing Provider  allopurinol (ZYLOPRIM) 300 MG tablet Take 300 mg by mouth daily.    [provider]  aspirin 81 MG tablet Take 81 mg by mouth daily.      [provider]  atorvastatin (LIPITOR) 20 MG tablet Take 1 tablet (20 mg total) by mouth daily. 02/25/18 02/20/19  Daune Perch, NP  celecoxib  (CELEBREX) 200 MG capsule Take 200 mg by mouth daily.     [provider]  cyanocobalamin 1000 MCG tablet Take 100 mcg by mouth daily.    [provider]  donepezil (ARICEPT) 5 MG tablet TAKE 1 TABLET BY MOUTH AT BEDTIME FOR 1 MONTH 02/13/18   [provider]  gabapentin (NEURONTIN) 100 MG capsule Take 1 capsule (100 mg total) by mouth at bedtime. 03/21/18   Wallene Huh, DPM  levothyroxine (SYNTHROID, LEVOTHROID) 112 MCG tablet Take 112 mcg by mouth daily before breakfast.    [provider]  losartan (COZAAR) 50 MG tablet Take 50 mg by mouth daily.    [provider]  metoprolol succinate (TOPROL-XL) 25 MG 24 hr tablet Take 25 mg by mouth daily.    [provider]  nitroGLYCERIN (NITROSTAT) 0.4 MG SL tablet PLACE 1 TABLET (0.4 MG TOTAL) UNDER THE TONGUE EVERY 5 (FIVE) MINUTES AS NEEDED. 06/24/18   Zayde Stroupe, Wonda Cheng, MD  pantoprazole (PROTONIX) 40 MG tablet Take 40 mg by mouth daily.     [provider]  sildenafil (VIAGRA) 50 MG tablet Take 1 tablet (50 mg total) by mouth daily as needed for erectile dysfunction. 05/28/18   Kemani Heidel, Wonda Cheng, MD  tamsulosin (FLOMAX) 0.4 MG CAPS capsule Take 0.4 mg by mouth daily.    [provider]  Vitamin D, Ergocalciferol, (DRISDOL) 50000 units CAPS capsule Take 50,000 Units by mouth once a week. 07/31/17   [provider]     Allergies:    Allergies  Allergen Reactions  . Halcion [Triazolam]     "Makes me crazy"  . Pravachol     Unknown    Social History:   Social History   Socioeconomic History  . Marital status: Married    Spouse name: Not on file  . Number of children: Not on file  . Years of education: Not on file  . Highest education level: Not on file  Occupational History  . Occupation: Retired    Fish farm manager: RETIRED  Social Needs  . Financial resource strain: Not on file  . Food insecurity    Worry: Not on file    Inability: Not on file  . Transportation  needs    Medical: Not on file    Non-medical: Not on file  Tobacco Use  . Smoking status: Former Smoker    Packs/day: 1.00    Years: 15.00    Pack years: 15.00    Types: Cigarettes    Quit date: 05/15/1958    Years since quitting: 60.5  . Smokeless tobacco: Never Used  Substance and Sexual Activity  . Alcohol use: Yes    Comment: occasional beer  . Drug use: No  . Sexual activity: Yes  Lifestyle  . Physical activity    Days per week: Not on file    Minutes per session: Not on file  . Stress: Not on file  Relationships  . Social Herbalist on phone: Not on file    Gets together: Not on file    Attends religious service: Not on file    Active member of club or organization: Not on file    Attends meetings of clubs or organizations: Not on file    Relationship status: Not on file  . Intimate partner violence    Fear of current or ex partner: Not on file    Emotionally abused: Not on file    Physically abused: Not on file    Forced sexual activity: Not on file  Other Topics Concern  . Not on file  Social History Narrative  . Not on file    Family History:   CHF  The patient's family history includes Heart failure (age of onset: 20) in his mother.    ROS:  Please see the history of present illness.    All other ROS reviewed and negative.     Physical Exam/Data:   Vitals:   11/14/18 1623 11/14/18 1715 11/14/18 1745  BP: (!) 130/59 (!) 146/62 (!) 138/54  Pulse: 63 60 62  Resp: 16 12 15   Temp: 97.7 F (36.5 C)    TempSrc: Oral    SpO2: 100% 99% 100%   No intake or output data in the 24 hours ending 11/14/18 1756 Last 3 Weights 07/13/2018 05/28/2018 03/07/2018  Weight (lbs) 165 lb 167 lb 9.6 oz 166 lb  Weight (kg) 74.844 kg 76.023 kg 75.297 kg     There is no height or weight on file to calculate BMI.  General:   Elderly male,  Nad, pain free at present  HEENT: normal Lymph: no adenopathy Neck: no JVD Endocrine:  No thryomegaly Vascular: No carotid  bruits; FA pulses 2+ bilaterally without bruits  Cardiac:  normal S1, S2; RRR; no murmur  Lungs:  clear to auscultation bilaterally, no wheezing, rhonchi or rales  Abd: soft, nontender, no hepatomegaly  Ext: no edema Musculoskeletal:  No deformities, BUE and BLE strength normal and equal Skin: warm and dry  Neuro:  CNs 2-12 intact, no focal abnormalities noted Psych:  Normal affect    EKG:    NSR , old ant. MI , no acute ST or T wave changes.  Relevant CV Studies:   Laboratory Data:  High Sensitivity Troponin:  No results for input(s): TROPONINIHS in the last 720 hours.    Cardiac EnzymesNo results for input(s): TROPONINI in the last 168 hours. No results for input(s): TROPIPOC in the last 168 hours.  ChemistryNo results for input(s): NA, K, CL, CO2, GLUCOSE, BUN, CREATININE, CALCIUM, GFRNONAA, GFRAA, ANIONGAP in the last 168 hours.  No results for input(s): PROT, ALBUMIN, AST, ALT, ALKPHOS, BILITOT in the last 168 hours. HematologyNo results for input(s): WBC, RBC, HGB, HCT, MCV, MCH, MCHC, RDW, PLT in the last 168 hours. BNPNo results for input(s): BNP, PROBNP in the last 168 hours.  DDimer No results for input(s): DDIMER in the last 168 hours.   Radiology/Studies:  No results found.  Assessment and Plan:   1. Unstable angina:   myoview in Oct. 2019 showed an old inf . Lat scar with no significant ischemia.   Now presents with symptoms worrisome for UAP.   Will admit,  IV heparin .   Low threshold for IV NTG if he develops more cp.   Check troponins. Q 6 hrs.    Echo  I suspect he would benefit from a cath.  He is a fairly health 85 yo and I think would be a good candidate for PCI.  Will discuss with him tomorrow am   2.  Hyperlipidemia:  Continue atorva.   Check lipids tomorrow   Severity of Illness: The appropriate patient status for this patient is INPATIENT. Inpatient status is judged to be reasonable and necessary in order to provide the required intensity of service  to ensure the patient's safety. The patient's presenting symptoms, physical exam findings, and initial radiographic and laboratory data in the context of their chronic comorbidities is felt to place them at high risk for further clinical deterioration. Furthermore, it is not anticipated that the patient will be medically stable for discharge from the hospital within 2 midnights of admission. The following factors support the patient status of inpatient.   " The patient's presenting symptoms include unstable angina . " The worrisome physical exam findings include . " The initial radiographic and laboratory data are worrisome because of  " The chronic co-morbidities include  Hx of CAD .   * I certify that at the point of admission it is my clinical judgment that the patient will require inpatient hospital care spanning beyond 2 midnights from the point of admission due to high intensity of service, high risk for further deterioration and high frequency of surveillance required.*    For questions or updates, please contact [facility] Please consult [email or web address] for contact info under        Signed, Mertie Moores, MD  11/14/2018 5:56 PM

## 2018-11-14 NOTE — ED Notes (Signed)
ED TO INPATIENT HANDOFF REPORT  ED Nurse Name and Phone #:  Arby Barrette, RN -[phone]  S Name/Age/Gender Patrick [name] 85 y.o. male Room/Bed: 033C/033C  Code Status   Code Status: Not on file  Home/SNF/Other Home Patient oriented to: self, place, time and situation Is this baseline? Yes      Chief Complaint Sent by doctor,Chest Pain   Triage Note No notes on file   Allergies Allergies  Allergen Reactions  . Halcion [Triazolam]     "Makes me crazy"  . Pravachol     Unknown    Level of Care/Admitting Diagnosis ED Disposition    ED Disposition Condition Comment   Admit  Hospital Area: [facility] [[id number]]  Level of Care: Progressive [102]  Covid Evaluation: Confirmed COVID Negative  Diagnosis: Unstable angina [facility] [place]) [[id number]]  Admitting Physician: Thayer Headings [phone]  Attending Physician: Thayer Headings [phone]  Estimated length of stay: past midnight tomorrow  Certification:: I certify this patient will need inpatient services for at least 2 midnights  Bed request comments: 6E  PT Class (Do Not Modify): Inpatient [101]  PT Acc Code (Do Not Modify): Private [1]       B Medical/Surgery History Past Medical History:  Diagnosis Date  . BPH (benign prostatic hypertrophy)   . Diverticulosis   . ED (erectile dysfunction)   . GERD (gastroesophageal reflux disease)   . Gout   . Hepatitis    hx hepatitis after mono as teenager  . History of kidney stones   . History of skin cancer   . Hyperlipidemia   . Hypertension   . Hypothyroidism   . IHD (ischemic heart disease)    Remote CABG in 1997  . MI, acute, non ST segment elevation ([place]) 2010   s/p cath with occluded SVG to PD that fills by collaterals and the remainder of his revasculsarization is satisfactory. "the first time they did the stent , the second time they did the graft 8 vessels"  . Nocturia   . Urinary frequency    Past Surgical History:  Procedure Laterality Date  .  APPENDECTOMY    . CARDIAC CATHETERIZATION  09/08/2008   NORMAL. EF 60%; Occluded SVG to PD that fills by collaterals and the remainder of his revascularization is satisfactory. he is managed medically.   . CORONARY ARTERY BYPASS GRAFT  1997   CABG x 8  . CYSTOSCOPY WITH INSERTION OF UROLIFT N/A 03/19/2015   Procedure: CYSTOSCOPY WITH INSERTION OF FOUR UROLIFTS;  Surgeon: Carolan Clines, MD;  [facility];  Service: Urology;  Laterality: N/A;  . ESOPHAGOGASTRODUODENOSCOPY (EGD) WITH PROPOFOL N/A 02/12/2017   Procedure: ESOPHAGOGASTRODUODENOSCOPY (EGD) WITH PROPOFOL;  Surgeon: Otis Brace, MD;  [facility];  Service: Gastroenterology;  Laterality: N/A;  . ESOPHAGOGASTRODUODENOSCOPY (EGD) WITH PROPOFOL N/A 07/13/2018   Procedure: ESOPHAGOGASTRODUODENOSCOPY (EGD) WITH PROPOFOL;  Surgeon: Clarene Essex, MD;  [facility];  Service: Endoscopy;  Laterality: N/A;  . FOREIGN BODY REMOVAL N/A 07/13/2018   Procedure: FOREIGN BODY REMOVAL;  Surgeon: Clarene Essex, MD;  [facility];  Service: Endoscopy;  Laterality: N/A;     A IV Location/Drains/Wounds Patient Lines/Drains/Airways Status   Active Line/Drains/Airways    Name:   Placement date:   Placement time:   Site:   Days:   Peripheral IV 11/14/18 Left Forearm   11/14/18    1830    Forearm   less than 1          Intake/Output Last 24  hours No intake or output data in the 24 hours ending 11/14/18 1858  Labs/Imaging Results for orders placed or performed during the hospital encounter of 11/14/18 (from the past 48 hour(s))  Comprehensive metabolic panel     Status: Abnormal   Collection Time: 11/14/18  5:17 PM  Result Value Ref Range   Sodium 137 135 - 145 mmol/L   Potassium 4.2 3.5 - 5.1 mmol/L   Chloride 104 98 - 111 mmol/L   CO2 24 22 - 32 mmol/L   Glucose, Bld 107 (H) 70 - 99 mg/dL   BUN 22 8 - 23 mg/dL   Creatinine, Ser 1.21 0.61 - 1.24 mg/dL   Calcium 9.1 8.9 - 10.3 mg/dL   Total Protein 6.8 6.5  - 8.1 g/dL   Albumin 4.3 3.5 - 5.0 g/dL   AST 29 15 - 41 U/L   ALT 32 0 - 44 U/L   Alkaline Phosphatase 74 38 - 126 U/L   Total Bilirubin 1.1 0.3 - 1.2 mg/dL   GFR calc non Af Amer 53 (L) >60 mL/min   GFR calc Af Amer >60 >60 mL/min   Anion gap 9 5 - 15    Comment: Performed at [facility], [address] [place], [place] [address]  Lipase, blood     Status: None   Collection Time: 11/14/18  5:17 PM  Result Value Ref Range   Lipase 34 11 - 51 U/L    Comment: Performed at [facility] [address], [place], [place] [address]  CBC with Differential     Status: Abnormal   Collection Time: 11/14/18  5:17 PM  Result Value Ref Range   WBC 5.0 4.0 - 10.5 K/uL   RBC 4.19 (L) 4.22 - 5.81 MIL/uL   Hemoglobin 13.4 13.0 - 17.0 g/dL   HCT 40.7 39.0 - 52.0 %   MCV 97.1 80.0 - 100.0 fL   MCH 32.0 26.0 - 34.0 pg   MCHC 32.9 30.0 - 36.0 g/dL   RDW 12.7 11.5 - 15.5 %   Platelets 100 (L) 150 - 400 K/uL    Comment: REPEATED TO VERIFY PLATELET COUNT CONFIRMED BY SMEAR SPECIMEN CHECKED FOR CLOTS Immature Platelet Fraction may be clinically indicated, consider ordering this additional test [id number]    nRBC 0.0 0.0 - 0.2 %   Neutrophils Relative % 52 %   Neutro Abs 2.6 1.7 - 7.7 K/uL   Lymphocytes Relative 21 %   Lymphs Abs 1.0 0.7 - 4.0 K/uL   Monocytes Relative 25 %   Monocytes Absolute 1.2 (H) 0.1 - 1.0 K/uL   Eosinophils Relative 0 %   Eosinophils Absolute 0.0 0.0 - 0.5 K/uL   Basophils Relative 0 %   Basophils Absolute 0.0 0.0 - 0.1 K/uL   Immature Granulocytes 2 %   Abs Immature Granulocytes 0.09 (H) 0.00 - 0.07 K/uL    Comment: Performed at [facility] [address] [place],  [address]  Troponin I (High Sensitivity)     Status: None   Collection Time: 11/14/18  5:17 PM  Result Value Ref Range   Troponin I (High Sensitivity) 8 <18 ng/L    Comment: (NOTE) Elevated high sensitivity troponin I (hsTnI) values and significant  changes across serial  measurements may suggest ACS but many other  chronic and acute conditions are known to elevate hsTnI results.  Refer to the "Links" section for chest pain algorithms and additional  guidance. Performed at [facility], [place] [address], [place], [place] [address]   D-dimer, quantitative     Status: None   Collection Time: 11/14/18  5:28 PM  Result Value Ref Range   D-Dimer, Quant 0.38 0.00 - 0.50 ug/mL-FEU    Comment: (NOTE) At the manufacturer cut-off of 0.50 ug/mL FEU, this assay has been documented to exclude PE with a sensitivity and negative predictive value of 97 to 99%.  At this time, this assay has not been approved by the FDA to exclude DVT/VTE. Results should be correlated with clinical presentation. Performed at [facility], [place] [address], [place], [place] [address]    No results found.  Pending Labs Unresulted Labs (From admission, onward)    Start     Ordered   11/16/18 0500  Heparin level (unfractionated)  Daily,   R     11/14/18 1835   11/15/18 0500  Troponin I (High Sensitivity)  ONCE - STAT,   STAT    Question:  Indication  Answer:  Suspect ACS   11/14/18 1832   11/15/18 0500  CBC  Daily,   R     11/14/18 1835   11/15/18 0300  Heparin level (unfractionated)  Once-Timed,   STAT     11/14/18 1835   11/14/18 2300  Troponin I (High Sensitivity)  ONCE - STAT,   STAT    Question:  Indication  Answer:  Suspect ACS   11/14/18 1832   11/14/18 1717  Brain natriuretic peptide  Once,   STAT     11/14/18 1723   11/14/18 1717  Troponin I (High Sensitivity)  STAT Now then every 2 hours,   STAT     11/14/18 1723   Signed and Held  TSH  Once,   R     Signed and Held   Signed and Held  T4, free  Once,   R     Signed and Held   Signed and Held  Magnesium  Once,   R     Signed and Held   Signed and Held  Basic metabolic panel  Tomorrow morning,   R     Signed and Held   Signed and Held  Lipid panel  Tomorrow morning,   R     Signed and Held   Signed and  Held  CBC  Tomorrow morning,   R     Signed and Held          Vitals/Pain Today's Vitals   11/14/18 1744 11/14/18 1745 11/14/18 1800 11/14/18 1815  BP:  (!) 138/54 116/72 (!) 164/84  Pulse:  62 (!) 56 68  Resp:  15 17 (!) 21  Temp:      TempSrc:      SpO2:  100% 100% 100%  Weight:   74.8 kg   Height:   5\' 11"  (1.803 m)   PainSc: 0-No pain       Isolation Precautions No active isolations  Medications Medications  heparin ADULT infusion 100 units/mL (25000 units/287mL sodium chloride 0.45%) (900 Units/hr Intravenous New Bag/Given 11/14/18 1847)  heparin bolus via infusion 4,000 Units (4,000 Units Intravenous Bolus from Bag 11/14/18 1848)    Mobility walks Low fall risk   Focused Assessments Cardiac Assessment Handoff:    Lab Results  Component Value Date   CKTOTAL 186 09/08/2008   CKMB 7.1 (H) 09/08/2008   TROPONINI <0.03 02/12/2017   Lab Results  Component Value Date   DDIMER 0.38 11/14/2018   Does  the Patient currently have chest pain? No     R Recommendations: See Admitting Provider Note  Report given to: 6E28  Additional Notes:  Pt is A/O, very pleasant, Heparin gtt is actively good at this time.

## 2018-11-14 NOTE — Progress Notes (Signed)
ANTICOAGULATION CONSULT NOTE - Initial Consult  Pharmacy Consult for heparin Indication: chest pain/ACS  Allergies  Allergen Reactions  . Halcion [Triazolam]     "Makes me crazy"  . Pravachol     Unknown    Patient Measurements: Height: 5\' 11"  (180.3 cm) Weight: 165 lb (74.8 kg) IBW/kg (Calculated) : 75.3 Heparin Dosing Weight: 74.8kg  Vital Signs: Temp: 97.7 F (36.5 C) (07/02 1623) Temp Source: Oral (07/02 1623) BP: 164/84 (07/02 1815) Pulse Rate: 68 (07/02 1815)  Labs: Recent Labs    11/14/18 1717  HGB 13.4  HCT 40.7  PLT 100*  CREATININE 1.21  TROPONINIHS 8    Estimated Creatinine Clearance: 43.8 mL/min (by C-G formula based on SCr of 1.21 mg/dL).   Medical History: Past Medical History:  Diagnosis Date  . BPH (benign prostatic hypertrophy)   . Diverticulosis   . ED (erectile dysfunction)   . GERD (gastroesophageal reflux disease)   . Gout   . Hepatitis    hx hepatitis after mono as teenager  . History of kidney stones   . History of skin cancer   . Hyperlipidemia   . Hypertension   . Hypothyroidism   . IHD (ischemic heart disease)    Remote CABG in 1997  . MI, acute, non ST segment elevation ([place]) 2010   s/p cath with occluded SVG to PD that fills by collaterals and the remainder of his revasculsarization is satisfactory. "the first time they did the stent , the second time they did the graft 8 vessels"  . Nocturia   . Urinary frequency     Medications:  Infusions:  . heparin      Assessment: 89 yom presented to the ED with CP and dyspnea. To start IV heparin. Baseline CBC is reported with a normal Hgb but platelets are low. This is consistent with past results. Will require close monitoring. He is not on anticoagulation PTA.   Goal of Therapy:  Heparin level 0.3-0.7 units/ml Monitor platelets by anticoagulation protocol: Yes   Plan:  Heparin bolus 4000 units IV x 1 Heparin gtt 900 units/hr Check an 8 hr heparin level Daily heparin  level and CBC Watch platelets closely and monitor for any bleeding  Kerryn Tennant, Rande Lawman 11/14/2018,6:37 PM

## 2018-11-14 NOTE — ED Notes (Signed)
Attempted report.  Nurse to call back when available. 

## 2018-11-14 NOTE — ED Provider Notes (Signed)
[facility] EMERGENCY DEPARTMENT Provider Note   CSN: [id number] Arrival date & time: 11/14/18  1612     History   Chief Complaint No chief complaint on file.   HPI Patrick [name] is a 85 y.o. male.     85yo M w/ PMH including CAD s/p CABG, HTN, HLD, GERD who p/w chest pain.  Last night around the time that the patient was going to bed, he began having central, nonradiating chest discomfort which he describes as an achiness that was mild.  He thought that it was reflux and he got up and walked around and it went away.  This morning when he woke up, he was having the same discomfort again.  It lasted approximately 30 minutes. No pain since then and none currently. No radiation of the pain and no associated nausea, vomiting, diaphoresis, or lightheadedness.  He has had shortness of breath for a while, at least the past year, he thinks it may be a Gevin Perea worse recently.  He notes getting very fatigued with activities. No fevers, cough/cold sx, vomiting, diarrhea, recent illness, LE edema, or recent travel. No med changes recently.   The history is provided by the patient.    Past Medical History:  Diagnosis Date  . BPH (benign prostatic hypertrophy)   . Diverticulosis   . ED (erectile dysfunction)   . GERD (gastroesophageal reflux disease)   . Gout   . Hepatitis    hx hepatitis after mono as teenager  . History of kidney stones   . History of skin cancer   . Hyperlipidemia   . Hypertension   . Hypothyroidism   . IHD (ischemic heart disease)    Remote CABG in 1997  . MI, acute, non ST segment elevation ([place]) 2010   s/p cath with occluded SVG to PD that fills by collaterals and the remainder of his revasculsarization is satisfactory. "the first time they did the stent , the second time they did the graft 8 vessels"  . Nocturia   . Urinary frequency     Patient Active Problem List   Diagnosis Date Noted  . Essential (primary) hypertension 02/25/2018  .  Claudication ([place]) 04/21/2016  . History of cold sores 03/22/2011  . Coronary artery disease involving native coronary artery of native heart without angina pectoris 10/28/2010  . Hyperlipemia 10/28/2010  . Hypothyroidism 10/28/2010    Past Surgical History:  Procedure Laterality Date  . APPENDECTOMY    . CARDIAC CATHETERIZATION  09/08/2008   NORMAL. EF 60%; Occluded SVG to PD that fills by collaterals and the remainder of his revascularization is satisfactory. he is managed medically.   . CORONARY ARTERY BYPASS GRAFT  1997   CABG x 8  . CYSTOSCOPY WITH INSERTION OF UROLIFT N/A 03/19/2015   Procedure: CYSTOSCOPY WITH INSERTION OF FOUR UROLIFTS;  Surgeon: Carolan Clines, MD;  Location: [facility];  Service: Urology;  Laterality: N/A;  . ESOPHAGOGASTRODUODENOSCOPY (EGD) WITH PROPOFOL N/A 02/12/2017   Procedure: ESOPHAGOGASTRODUODENOSCOPY (EGD) WITH PROPOFOL;  Surgeon: Otis Brace, MD;  Location: [facility];  Service: Gastroenterology;  Laterality: N/A;  . ESOPHAGOGASTRODUODENOSCOPY (EGD) WITH PROPOFOL N/A 07/13/2018   Procedure: ESOPHAGOGASTRODUODENOSCOPY (EGD) WITH PROPOFOL;  Surgeon: Clarene Essex, MD;  Location: [facility];  Service: Endoscopy;  Laterality: N/A;  . FOREIGN BODY REMOVAL N/A 07/13/2018   Procedure: FOREIGN BODY REMOVAL;  Surgeon: Clarene Essex, MD;  Location: [facility];  Service: Endoscopy;  Laterality: N/A;        Home Medications  Prior to Admission medications   Medication Sig Start Date End Date Taking? Authorizing Provider  allopurinol (ZYLOPRIM) 300 MG tablet Take 300 mg by mouth daily.    [provider]  aspirin 81 MG tablet Take 81 mg by mouth daily.      [provider]  atorvastatin (LIPITOR) 20 MG tablet Take 1 tablet (20 mg total) by mouth daily. 02/25/18 02/20/19  Daune Perch, NP  celecoxib (CELEBREX) 200 MG capsule Take 200 mg by mouth daily.     [provider]  cyanocobalamin 1000 MCG tablet Take 100 mcg by mouth  daily.    [provider]  donepezil (ARICEPT) 5 MG tablet TAKE 1 TABLET BY MOUTH AT BEDTIME FOR 1 MONTH 02/13/18   [provider]  gabapentin (NEURONTIN) 100 MG capsule Take 1 capsule (100 mg total) by mouth at bedtime. 03/21/18   Wallene Huh, DPM  levothyroxine (SYNTHROID, LEVOTHROID) 112 MCG tablet Take 112 mcg by mouth daily before breakfast.    [provider]  losartan (COZAAR) 50 MG tablet Take 50 mg by mouth daily.    [provider]  metoprolol succinate (TOPROL-XL) 25 MG 24 hr tablet Take 25 mg by mouth daily.    [provider]  nitroGLYCERIN (NITROSTAT) 0.4 MG SL tablet PLACE 1 TABLET (0.4 MG TOTAL) UNDER THE TONGUE EVERY 5 (FIVE) MINUTES AS NEEDED. 06/24/18   Nahser, Wonda Cheng, MD  pantoprazole (PROTONIX) 40 MG tablet Take 40 mg by mouth daily.     [provider]  sildenafil (VIAGRA) 50 MG tablet Take 1 tablet (50 mg total) by mouth daily as needed for erectile dysfunction. 05/28/18   Nahser, Wonda Cheng, MD  tamsulosin (FLOMAX) 0.4 MG CAPS capsule Take 0.4 mg by mouth daily.    [provider]  Vitamin D, Ergocalciferol, (DRISDOL) 50000 units CAPS capsule Take 50,000 Units by mouth once a week. 07/31/17   [provider]    Family History Family History  Problem Relation Age of Onset  . Heart failure Mother 81    Social History Social History   Tobacco Use  . Smoking status: Former Smoker    Packs/day: 1.00    Years: 15.00    Pack years: 15.00    Types: Cigarettes    Quit date: 05/15/1958    Years since quitting: 60.5  . Smokeless tobacco: Never Used  Substance Use Topics  . Alcohol use: Yes    Comment: occasional beer  . Drug use: No     Allergies   Halcion [triazolam] and Pravachol   Review of Systems Review of Systems All other systems reviewed and are negative except that which was mentioned in HPI   Physical Exam Updated Vital Signs BP (!) 130/59 (BP Location: Right Arm)   Pulse 63    Temp 97.7 F (36.5 C) (Oral)   Resp 16   SpO2 100%   Physical Exam Vitals signs and nursing note reviewed.  Constitutional:      General: He is not in acute distress.    Appearance: He is well-developed.  HENT:     Head: Normocephalic and atraumatic.     Nose: Nose normal.  Eyes:     Conjunctiva/sclera: Conjunctivae normal.  Neck:     Musculoskeletal: Neck supple.  Cardiovascular:     Rate and Rhythm: Normal rate and regular rhythm.     Heart sounds: Normal heart sounds. No murmur.  Pulmonary:     Effort: Pulmonary effort is normal.  Breath sounds: Normal breath sounds.  Abdominal:     General: Bowel sounds are normal. There is no distension.     Palpations: Abdomen is soft.     Tenderness: There is no abdominal tenderness.  Musculoskeletal:     Right lower leg: No edema.     Left lower leg: No edema.  Skin:    General: Skin is warm and dry.  Neurological:     Mental Status: He is alert and oriented to person, place, and time.     Comments: Fluent speech  Psychiatric:        Judgment: Judgment normal.      ED Treatments / Results  Labs (all labs ordered are listed, but only abnormal results are displayed) Labs Reviewed  COMPREHENSIVE METABOLIC PANEL - Abnormal; Notable for the following components:      Result Value   Glucose, Bld 107 (*)    GFR calc non Af Amer 53 (*)    All other components within normal limits  CBC WITH DIFFERENTIAL/PLATELET - Abnormal; Notable for the following components:   RBC 4.19 (*)    Platelets 100 (*)    Monocytes Absolute 1.2 (*)    Abs Immature Granulocytes 0.09 (*)    All other components within normal limits  LIPASE, BLOOD  TROPONIN I (HIGH SENSITIVITY)  TROPONIN I (HIGH SENSITIVITY)  D-DIMER, QUANTITATIVE (NOT AT [facility])  BRAIN NATRIURETIC PEPTIDE  TROPONIN I (HIGH SENSITIVITY)  TROPONIN I (HIGH SENSITIVITY)  HEPARIN LEVEL (UNFRACTIONATED)  CBC    EKG EKG Interpretation  Date/Time:  Thursday November 14 2018  16:21:43 EDT Ventricular Rate:  64 PR Interval:  182 QRS Duration: 86 QT Interval:  436 QTC Calculation: 449 R Axis:   -65 Text Interpretation:  Sinus rhythm with Premature atrial complexes Left axis deviation Anterolateral infarct , age undetermined Abnormal ECG some artifact but overall similar to previous Confirmed by Theotis Burrow [phone]) on 11/14/2018 5:24:03 PM   Radiology No results found.  Procedures Procedures (including critical care time)  Medications Ordered in ED Medications - No data to display   Initial Impression / Assessment and Plan / ED Course  I have reviewed the triage vital signs and the nursing notes.  Pertinent labs & imaging results that were available during my care of the patient were reviewed by me and considered in my medical decision making (see chart for details).       Comfortable on exam, no complaints of pain.  EKG without acute ischemic changes.  Vital signs reassuring.  No abdominal tenderness. Basic labs normal. Trop and d-dimer negative. CXR is pending.  Description and intermittent nature of symptoms does not suggest aortic dissection or intra-abdominal process.  Concern for unstable angina given extensive cardiac history.  Patient admitted by cardiology, Dr. Acie Fredrickson.  Final Clinical Impressions(s) / ED Diagnoses   Final diagnoses:  None    ED Discharge Orders    None       Aryani Daffern, Wenda Overland, MD 11/14/18 [phone]

## 2018-11-15 ENCOUNTER — Inpatient Hospital Stay (HOSPITAL_COMMUNITY): Payer: Medicare Other

## 2018-11-15 ENCOUNTER — Encounter (HOSPITAL_COMMUNITY): Payer: Self-pay

## 2018-11-15 DIAGNOSIS — E0590 Thyrotoxicosis, unspecified without thyrotoxic crisis or storm: Secondary | ICD-10-CM

## 2018-11-15 DIAGNOSIS — I351 Nonrheumatic aortic (valve) insufficiency: Secondary | ICD-10-CM

## 2018-11-15 LAB — SARS CORONAVIRUS 2 BY RT PCR (HOSPITAL ORDER, PERFORMED IN ~~LOC~~ HOSPITAL LAB): SARS Coronavirus 2: NEGATIVE

## 2018-11-15 LAB — LIPID PANEL
Cholesterol: 91 mg/dL (ref 0–200)
HDL: 42 mg/dL (ref >40)
LDL Cholesterol: 41 mg/dL (ref 0–99)
Total CHOL/HDL Ratio: 2.2 RATIO
Triglycerides: 39 mg/dL (ref <150)
VLDL: 8 mg/dL (ref 0–40)

## 2018-11-15 LAB — CBC
HCT: 37.8 % — ABNORMAL LOW (ref 39.0–52.0)
Hemoglobin: 12.7 g/dL — ABNORMAL LOW (ref 13.0–17.0)
MCH: 32.5 pg (ref 26.0–34.0)
MCHC: 33.6 g/dL (ref 30.0–36.0)
MCV: 96.7 fL (ref 80.0–100.0)
Platelets: 90 10*3/uL — ABNORMAL LOW (ref 150–400)
RBC: 3.91 MIL/uL — ABNORMAL LOW (ref 4.22–5.81)
RDW: 12.8 % (ref 11.5–15.5)
WBC: 4.3 10*3/uL (ref 4.0–10.5)
nRBC: 0 % (ref 0.0–0.2)

## 2018-11-15 LAB — TROPONIN I (HIGH SENSITIVITY)
Troponin I (High Sensitivity): 16 ng/L (ref <18)
Troponin I (High Sensitivity): 11 ng/L (ref <18)

## 2018-11-15 LAB — ECHOCARDIOGRAM COMPLETE
Height: 71 in
Weight: 2449.6 oz

## 2018-11-15 LAB — BASIC METABOLIC PANEL
Anion gap: 9 (ref 5–15)
BUN: 17 mg/dL (ref 8–23)
CO2: 23 mmol/L (ref 22–32)
Calcium: 9 mg/dL (ref 8.9–10.3)
Chloride: 106 mmol/L (ref 98–111)
Creatinine, Ser: 1.05 mg/dL (ref 0.61–1.24)
Glucose, Bld: 93 mg/dL (ref 70–99)
Potassium: 3.9 mmol/L (ref 3.5–5.1)
Sodium: 138 mmol/L (ref 135–145)

## 2018-11-15 LAB — MAGNESIUM: Magnesium: 2 mg/dL (ref 1.7–2.4)

## 2018-11-15 LAB — TSH: TSH: <0.01 u[IU]/mL — ABNORMAL LOW (ref 0.350–4.500)

## 2018-11-15 LAB — HEPARIN LEVEL (UNFRACTIONATED)
Heparin Unfractionated: 0.4 IU/mL (ref 0.30–0.70)
Heparin Unfractionated: 0.38 IU/mL (ref 0.30–0.70)

## 2018-11-15 LAB — T4, FREE: Free T4: 1.53 ng/dL — ABNORMAL HIGH (ref 0.61–1.12)

## 2018-11-15 MED ORDER — LEVOTHYROXINE SODIUM 50 MCG PO TABS
50.0000 ug | ORAL_TABLET | Freq: Every day | ORAL | Status: DC
  Administered 2018-11-15 – 2018-11-19 (×5): 50 ug via ORAL
  Filled 2018-11-15 (×4): qty 1

## 2018-11-15 NOTE — Progress Notes (Signed)
[facility] for Heparin Indication: chest pain/ACS  Allergies  Allergen Reactions  . Halcion [Triazolam]     "Makes me crazy"  . Pravachol     Unknown   Patient Measurements: Height: 5\' 11"  (180.3 cm) Weight: 157 lb 1.6 oz (71.3 kg) IBW/kg (Calculated) : 75.3 Heparin Dosing Weight: 74.8kg  Vital Signs: Temp: 97.7 F (36.5 C) (07/02 2046) Temp Source: Oral (07/02 2046) BP: 169/96 (07/02 2046) Pulse Rate: 79 (07/02 2046)  Labs: Recent Labs    11/14/18 1717 11/14/18 1806 11/15/18 0022 11/15/18 0250  HGB 13.4  --   --   --   HCT 40.7  --   --   --   PLT 100*  --   --   --   HEPARINUNFRC  --   --   --  0.40  CREATININE 1.21  --   --   --   TROPONINIHS 8 8 16   --     Estimated Creatinine Clearance: 41.7 mL/min (by C-G formula based on SCr of 1.21 mg/dL).   Medical History: Past Medical History:  Diagnosis Date  . BPH (benign prostatic hypertrophy)   . Diverticulosis   . ED (erectile dysfunction)   . GERD (gastroesophageal reflux disease)   . Gout   . Hepatitis    hx hepatitis after mono as teenager  . History of kidney stones   . History of skin cancer   . Hyperlipidemia   . Hypertension   . Hypothyroidism   . IHD (ischemic heart disease)    Remote CABG in 1997  . MI, acute, non ST segment elevation ([place]) 2010   s/p cath with occluded SVG to PD that fills by collaterals and the remainder of his revasculsarization is satisfactory. "the first time they did the stent , the second time they did the graft 8 vessels"  . Nocturia   . Urinary frequency     Medications:  Infusions:  . sodium chloride 10 mL/hr at 11/14/18 2225  . heparin 900 Units/hr (11/14/18 1847)   Assessment: 33 yom presented to the ED with CP and dyspnea. To start IV heparin. Baseline CBC is reported with a normal Hgb but platelets are low. This is consistent with past results. Will require close monitoring. He is not on anticoagulation PTA.   7/3 AM  update: initial heparin level therapeutic   Goal of Therapy:  Heparin level 0.3-0.7 units/ml Monitor platelets by anticoagulation protocol: Yes   Plan:  Cont heparin at 900 units/hr Confirmatory heparin level at 1200 Daily heparin level and CBC Watch platelets closely and monitor for any bleeding  Narda Bonds, PharmD, BCPS Clinical Pharmacist Phone: [phone]

## 2018-11-15 NOTE — Progress Notes (Signed)
[place] for Heparin Indication: chest pain/ACS  Allergies  Allergen Reactions  . Halcion [Triazolam]     "Makes me crazy"  . Pravachol     Unknown   Patient Measurements: Height: 5\' 11"  (180.3 cm) Weight: 153 lb 1.6 oz (69.4 kg) IBW/kg (Calculated) : 75.3 Heparin Dosing Weight: 74.8kg  Vital Signs: Temp: 97.6 F (36.4 C) (07/03 1223) Temp Source: Oral (07/03 1223) BP: 127/65 (07/03 1223) Pulse Rate: 61 (07/03 1223)  Labs: Recent Labs    11/14/18 1717 11/14/18 1806 11/15/18 0022 11/15/18 0250 11/15/18 0617 11/15/18 1125  HGB 13.4  --   --   --  12.7*  --   HCT 40.7  --   --   --  37.8*  --   PLT 100*  --   --   --  90*  --   HEPARINUNFRC  --   --   --  0.40  --  0.38  CREATININE 1.21  --   --   --  1.05  --   TROPONINIHS 8 8 16   --  11  --     Estimated Creatinine Clearance: 46.8 mL/min (by C-G formula based on SCr of 1.05 mg/dL).    Assessment: 66 yom presented to the ED with CP and dyspnea. To start IV heparin. Baseline CBC is reported with a normal Hgb but platelets are low. This is consistent with past results. Will require close monitoring. He is not on anticoagulation PTA.   7/3 AM update: initial heparin level therapeutic x 2   Goal of Therapy:  Heparin level 0.3-0.7 units/ml Monitor platelets by anticoagulation protocol: Yes   Plan:  Cont heparin at 900 units/hr  Daily heparin level and CBC Watch platelets closely and monitor for any bleeding  Thank you Anette Guarneri, PharmD

## 2018-11-15 NOTE — Progress Notes (Signed)
Progress Note  Patient Name: Patrick [name] Date of Encounter: 11/15/2018  Primary Cardiologist: Mertie Moores, MD   Subjective   85 yo with CAD.  Admitted with 3 episodes of worrisome chest pressure over the past 3-4 days  He is very healthy for 85 yo. Does have some dementia and tends to understate his symptoms.   His wife, [name] , was in the room and helped greatly with the history .    Hx of hypothyroidism - was on Synthroid 112 mcg a day ,  Was recently lowered to 88 mcg. TSH today is still < 0.010 .   Free T4 is 1.53   Inpatient Medications    Scheduled Meds: . allopurinol  300 mg Oral Daily  . aspirin EC  81 mg Oral Daily  . atorvastatin  20 mg Oral Daily  . celecoxib  200 mg Oral Daily  . donepezil  5 mg Oral QHS  . gabapentin  100 mg Oral QHS  . losartan  50 mg Oral Daily  . metoprolol succinate  25 mg Oral Daily  . pantoprazole  40 mg Oral Daily  . tamsulosin  0.4 mg Oral Daily  . cyanocobalamin  100 mcg Oral Daily  . [START ON 11/18/2018] Vitamin D (Ergocalciferol)  50,000 Units Oral Weekly   Continuous Infusions: . sodium chloride 10 mL/hr at 11/15/18 0417  . heparin 900 Units/hr (11/15/18 0417)   PRN Meds: acetaminophen, nitroGLYCERIN, nitroGLYCERIN, ondansetron (ZOFRAN) IV   Vital Signs    Vitals:   11/14/18 1959 11/14/18 2018 11/14/18 2046 11/15/18 0655  BP:  123/70 (!) 169/96 (!) 158/71  Pulse:  82 79 63  Resp:  16 16 16   Temp: 98.6 F (37 C) 97.8 F (36.6 C) 97.7 F (36.5 C) 97.6 F (36.4 C)  TempSrc: Oral Oral Oral Oral  SpO2: 98% 97% 100% 97%  Weight:   71.3 kg 69.4 kg  Height:   5\' 11"  (1.803 m)     Intake/Output Summary (Last 24 hours) at 11/15/2018 0916 Last data filed at 11/15/2018 0417 Gross per 24 hour  Intake 183.66 ml  Output -  Net 183.66 ml   Last 3 Weights 11/15/2018 11/14/2018 11/14/2018  Weight (lbs) 153 lb 1.6 oz 157 lb 1.6 oz 165 lb  Weight (kg) 69.446 kg 71.26 kg 74.844 kg      Telemetry    NSR  - Personally  Reviewed  ECG     NSR  - Personally Reviewed  Physical Exam   GEN:  elderly male,  NAD ,    Neck: No JVD Cardiac: RRR, no murmurs, rubs, or gallops.  Respiratory: Clear to auscultation bilaterally. GI: Soft, nontender, non-distended  MS: No edema; No deformity. Neuro:  Nonfocal  Psych: Normal affect   Labs    High Sensitivity Troponin:   Recent Labs  Lab 11/14/18 1717 11/14/18 1806 11/15/18 0022 11/15/18 0617  TROPONINIHS 8 8 16 11       Cardiac EnzymesNo results for input(s): TROPONINI in the last 168 hours. No results for input(s): TROPIPOC in the last 168 hours.   Chemistry Recent Labs  Lab 11/14/18 1717 11/15/18 0617  NA 137 138  K 4.2 3.9  CL 104 106  CO2 24 23  GLUCOSE 107* 93  BUN 22 17  CREATININE 1.21 PENDING  CALCIUM 9.1 9.0  PROT 6.8  --   ALBUMIN 4.3  --   AST 29  --   ALT 32  --   ALKPHOS 74  --  BILITOT 1.1  --   GFRNONAA 53* NOT CALCULATED  GFRAA >60 NOT CALCULATED  ANIONGAP 9 9     Hematology Recent Labs  Lab 11/14/18 1717 11/15/18 0617  WBC 5.0 4.3  RBC 4.19* 3.91*  HGB 13.4 12.7*  HCT 40.7 37.8*  MCV 97.1 96.7  MCH 32.0 32.5  MCHC 32.9 33.6  RDW 12.7 12.8  PLT 100* 90*    BNP Recent Labs  Lab 11/14/18 1717  BNP 82.0     DDimer  Recent Labs  Lab 11/14/18 1728  DDIMER 0.38     Radiology    Dg Chest Port 1 View  Result Date: 11/14/2018 CLINICAL DATA:  Chest pain EXAM: PORTABLE CHEST 1 VIEW COMPARISON:  02/12/2017 FINDINGS: The patient is status post prior median sternotomy. There is no pneumothorax. No large pleural effusion. There is a minimal amount of scarring at the lung bases bilaterally. No acute osseous abnormality. IMPRESSION: No active disease. Electronically Signed   By: Constance Holster M.D.   On: 11/14/2018 21:22    Cardiac Studies     Patient Profile     85 y.o. male  With CAD   Assessment & Plan    1.  CAD:  He has had significant fatigue and doe for past several weeks.    3-4 days of  worrisome chest pressure with minimal exertion. HS troponin is normal.   ECG does not show any significant abn.   He is stable on IV heparin  Given his symptoms, I think he needs to stay and have a cardiac cath with possible PCI on Monday    I've discussed this with him and his wife. We have discussed risks, benefits, options.  He understands and agrees to proceed.   2.  Hypothyroidism:   TSH is still very low.   Will reduce synthroid to 50 mcg a day for now.   Further recs per Dr. Brigitte Pulse [facility]. )     For questions or updates, please contact [place] Please consult [email or web address] for contact info under        Signed, Mertie Moores, MD  11/15/2018, 9:16 AM

## 2018-11-15 NOTE — Progress Notes (Signed)
This RN spoke with patient's wife last night.  [name] concerned that patient would not be able to relay information accurately to wife due to patient having dementia.  [name] requested to at least be at bedside this morning when MD rounds.  RN spoke with [facility] and received approval for patient's wife to be at bedside this morning.  RN explained to patient's wife that after MD rounded and plan of care was determined, patient's wife staying at bedside would be re-evaluated due to current hospital visitor restrictions.  [name] agreeable.

## 2018-11-15 NOTE — Progress Notes (Signed)
  Echocardiogram 2D Echocardiogram has been performed.  Patrick [name] 11/15/2018, 10:25 AM

## 2018-11-16 LAB — CBC
HCT: 38.2 % — ABNORMAL LOW (ref 39.0–52.0)
Hemoglobin: 13 g/dL (ref 13.0–17.0)
MCH: 32.7 pg (ref 26.0–34.0)
MCHC: 34 g/dL (ref 30.0–36.0)
MCV: 96 fL (ref 80.0–100.0)
Platelets: 91 10*3/uL — ABNORMAL LOW (ref 150–400)
RBC: 3.98 MIL/uL — ABNORMAL LOW (ref 4.22–5.81)
RDW: 12.5 % (ref 11.5–15.5)
WBC: 4.6 10*3/uL (ref 4.0–10.5)
nRBC: 0 % (ref 0.0–0.2)

## 2018-11-16 LAB — HEPARIN LEVEL (UNFRACTIONATED): Heparin Unfractionated: 0.42 IU/mL (ref 0.30–0.70)

## 2018-11-16 NOTE — Progress Notes (Signed)
Progress Note  Patient Name: Patrick Rica H Redcay Date of Encounter: 11/16/2018  Primary Cardiologist: Mertie Moores, MD   Subjective   "I feel better." Denies chest pain.  Inpatient Medications    Scheduled Meds: . allopurinol  300 mg Oral Daily  . aspirin EC  81 mg Oral Daily  . atorvastatin  20 mg Oral Daily  . celecoxib  200 mg Oral Daily  . donepezil  5 mg Oral QHS  . gabapentin  100 mg Oral QHS  . levothyroxine  50 mcg Oral Q0600  . losartan  50 mg Oral Daily  . metoprolol succinate  25 mg Oral Daily  . pantoprazole  40 mg Oral Daily  . tamsulosin  0.4 mg Oral Daily  . cyanocobalamin  100 mcg Oral Daily  . [START ON 11/18/2018] Vitamin D (Ergocalciferol)  50,000 Units Oral Weekly   Continuous Infusions: . sodium chloride 10 mL/hr at 11/15/18 0417  . heparin 900 Units/hr (11/15/18 0417)   PRN Meds: acetaminophen, nitroGLYCERIN, nitroGLYCERIN, ondansetron (ZOFRAN) IV   Vital Signs    Vitals:   11/15/18 1223 11/15/18 2133 11/16/18 0452 11/16/18 0909  BP: 127/65 (!) 167/66 (!) 158/75 (!) 165/67  Pulse: 61 60 70 70  Resp:  18 18   Temp: 97.6 F (36.4 C) 98 F (36.7 C) 98 F (36.7 C)   TempSrc: Oral Oral Oral   SpO2:  99% 96%   Weight:   68.9 kg   Height:        Intake/Output Summary (Last 24 hours) at 11/16/2018 0959 Last data filed at 11/16/2018 1287 Gross per 24 hour  Intake 720 ml  Output -  Net 720 ml   Filed Weights   11/14/18 2046 11/15/18 0655 11/16/18 0452  Weight: 71.3 kg 69.4 kg 68.9 kg    Telemetry    nsr - Personally Reviewed  ECG    none - Personally Reviewed  Physical Exam   GEN: No acute distress.   Neck: No JVD Cardiac: RRR Respiratory: no increased work of breathing GI: Soft, nontender, non-distended  MS: No edema; No deformity. Neuro:  Nonfocal  Psych: Normal affect   Labs    Chemistry Recent Labs  Lab 11/14/18 1717 11/15/18 0617  NA 137 138  K 4.2 3.9  CL 104 106  CO2 24 23  GLUCOSE 107* 93  BUN 22 17   CREATININE 1.21 1.05  CALCIUM 9.1 9.0  PROT 6.8  --   ALBUMIN 4.3  --   AST 29  --   ALT 32  --   ALKPHOS 74  --   BILITOT 1.1  --   GFRNONAA 53* NOT CALCULATED  GFRAA >60 NOT CALCULATED  ANIONGAP 9 9     Hematology Recent Labs  Lab 11/14/18 1717 11/15/18 0617 11/16/18 0232  WBC 5.0 4.3 4.6  RBC 4.19* 3.91* 3.98*  HGB 13.4 12.7* 13.0  HCT 40.7 37.8* 38.2*  MCV 97.1 96.7 96.0  MCH 32.0 32.5 32.7  MCHC 32.9 33.6 34.0  RDW 12.7 12.8 12.5  PLT 100* 90* 91*    Cardiac EnzymesNo results for input(s): TROPONINI in the last 168 hours. No results for input(s): TROPIPOC in the last 168 hours.   BNP Recent Labs  Lab 11/14/18 1717  BNP 82.0     DDimer  Recent Labs  Lab 11/14/18 1728  DDIMER 0.38     Radiology    Dg Chest Port 1 View  Result Date: 11/14/2018 CLINICAL DATA:  Chest pain EXAM: PORTABLE  CHEST 1 VIEW COMPARISON:  02/12/2017 FINDINGS: The patient is status post prior median sternotomy. There is no pneumothorax. No large pleural effusion. There is a minimal amount of scarring at the lung bases bilaterally. No acute osseous abnormality. IMPRESSION: No active disease. Electronically Signed   By: Constance Holster M.D.   On: 11/14/2018 21:22    Cardiac Studies   none  Patient Profile     85 y.o. male admitted with chest pressure like prior angina with a h/o CABG 23 years ago.   Assessment & Plan    1. [place] - he is pending coronary angio's on Monday. Continue IV heparin for now. 2. Hyperthyroidism - his free T4 is high and TSH is low. His dose of synthroid has been reduced.  For questions or updates, please contact [place] Please consult [email or web address] for contact info under Cardiology/STEMI.   Signed, Cristopher Peru, MD  11/16/2018, 9:59 AM  Patient ID: Patrick [name], male   DOB: 1926/07/22, 85 y.o.   MRN: [id number]

## 2018-11-16 NOTE — Progress Notes (Signed)
[place] for Heparin Indication: chest pain/ACS  Allergies  Allergen Reactions  . Halcion [Triazolam]     "Makes me crazy"  . Pravachol     Unknown   Patient Measurements: Height: 5\' 11"  (180.3 cm) Weight: 151 lb 14.4 oz (68.9 kg) IBW/kg (Calculated) : 75.3 Heparin Dosing Weight: 74.8kg  Vital Signs: Temp: 98 F (36.7 C) (07/04 0452) Temp Source: Oral (07/04 0452) BP: 158/75 (07/04 0452) Pulse Rate: 70 (07/04 0452)  Labs: Recent Labs    11/14/18 1717 11/14/18 1806 11/15/18 0022 11/15/18 0250 11/15/18 0617 11/15/18 1125 11/16/18 0232  HGB 13.4  --   --   --  12.7*  --  13.0  HCT 40.7  --   --   --  37.8*  --  38.2*  PLT 100*  --   --   --  90*  --  91*  HEPARINUNFRC  --   --   --  0.40  --  0.38 0.42  CREATININE 1.21  --   --   --  1.05  --   --   TROPONINIHS 8 8 16   --  11  --   --     Estimated Creatinine Clearance: 46.5 mL/min (by C-G formula based on SCr of 1.05 mg/dL).   Assessment: 4 yom presented to the ED with chest pain and dyspnea. Pharmacy consulted to start IV heparin. Baseline CBC is reported with a normal Hgb but platelets are low. This is consistent with past results. Will require close monitoring. He is not on anticoagulation PTA.   Heparin level therapeutic at 0.42. CBC stable, pltc 91. No overt bleeding noted.   Goal of Therapy:  Heparin level 0.3-0.7 units/ml Monitor platelets by anticoagulation protocol: Yes   Plan:  Cont heparin at 900 units/hr Daily heparin level and CBC Watch platelets closely and monitor for any bleeding F/u cath on 7/6  Vertis Kelch, PharmD PGY2 Cardiology Pharmacy Resident Phone [phone] 11/16/2018       8:33 AM  Please check [email or web address] for unit-specific pharmacist phone numbers

## 2018-11-17 LAB — CBC
HCT: 36.9 % — ABNORMAL LOW (ref 39.0–52.0)
Hemoglobin: 12.5 g/dL — ABNORMAL LOW (ref 13.0–17.0)
MCH: 32.6 pg (ref 26.0–34.0)
MCHC: 33.9 g/dL (ref 30.0–36.0)
MCV: 96.1 fL (ref 80.0–100.0)
Platelets: 85 10*3/uL — ABNORMAL LOW (ref 150–400)
RBC: 3.84 MIL/uL — ABNORMAL LOW (ref 4.22–5.81)
RDW: 12.7 % (ref 11.5–15.5)
WBC: 4.9 10*3/uL (ref 4.0–10.5)
nRBC: 0 % (ref 0.0–0.2)

## 2018-11-17 LAB — HEPARIN LEVEL (UNFRACTIONATED): Heparin Unfractionated: 0.25 IU/mL — ABNORMAL LOW (ref 0.30–0.70)

## 2018-11-17 MED ORDER — SODIUM CHLORIDE 0.9% FLUSH: 3.0000 mL | Freq: Two times a day (BID) | INTRAVENOUS | Status: DC

## 2018-11-17 MED ORDER — SODIUM CHLORIDE 0.9 % WEIGHT BASED INFUSION
3.0000 mL/kg/h | INTRAVENOUS | Status: AC
  Administered 2018-11-18: 3 mL/kg/h via INTRAVENOUS

## 2018-11-17 MED ORDER — ASPIRIN EC 81 MG PO TBEC
81.0000 mg | DELAYED_RELEASE_TABLET | Freq: Every day | ORAL | Status: DC
  Administered 2018-11-19: 81 mg via ORAL
  Filled 2018-11-17: qty 1

## 2018-11-17 MED ORDER — SODIUM CHLORIDE 0.9% FLUSH: 3.0000 mL | INTRAVENOUS | Status: DC | PRN

## 2018-11-17 MED ORDER — SODIUM CHLORIDE 0.9 % IV SOLN: 250.0000 mL | INTRAVENOUS | Status: DC | PRN

## 2018-11-17 MED ORDER — ASPIRIN 81 MG PO CHEW
81.0000 mg | CHEWABLE_TABLET | ORAL | Status: AC
  Administered 2018-11-18: 81 mg via ORAL
  Filled 2018-11-17: qty 1

## 2018-11-17 MED ORDER — SODIUM CHLORIDE 0.9 % WEIGHT BASED INFUSION: 1.0000 mL/kg/h | INTRAVENOUS | Status: DC

## 2018-11-17 NOTE — Progress Notes (Signed)
[facility] for Heparin Indication: chest pain/ACS  Allergies  Allergen Reactions  . Halcion [Triazolam]     "Makes me crazy"  . Pravachol     Unknown   Patient Measurements: Height: 5\' 11"  (180.3 cm) Weight: 152 lb 8.9 oz (69.2 kg) IBW/kg (Calculated) : 75.3 Heparin Dosing Weight: 74.8kg  Vital Signs: Temp: 97.6 F (36.4 C) (07/05 0453) Temp Source: Oral (07/05 0453) BP: 146/65 (07/05 0453) Pulse Rate: 63 (07/05 0453)  Labs: Recent Labs    11/14/18 1717 11/14/18 1806 11/15/18 0022  11/15/18 0617 11/15/18 1125 11/16/18 0232 11/17/18 0446  HGB 13.4  --   --   --  12.7*  --  13.0 12.5*  HCT 40.7  --   --   --  37.8*  --  38.2* 36.9*  PLT 100*  --   --   --  90*  --  91* 85*  HEPARINUNFRC  --   --   --    < >  --  0.38 0.42 0.25*  CREATININE 1.21  --   --   --  1.05  --   --   --   TROPONINIHS 8 8 16   --  11  --   --   --    < > = values in this interval not displayed.    Estimated Creatinine Clearance: 46.7 mL/min (by C-G formula based on SCr of 1.05 mg/dL).   Assessment: 85 y.o. male with chest pain for heparin   Goal of Therapy:  Heparin level 0.3-0.7 units/ml Monitor platelets by anticoagulation protocol: Yes   Plan:  Increase Heparin 1000 units/hr  Phillis Knack, PharmD, BCPS  11/17/2018       5:45 AM

## 2018-11-17 NOTE — Progress Notes (Signed)
Progress Note  Patient Name: Patrick [name] Date of Encounter: 11/17/2018  Primary Cardiologist: Mertie Moores, MD   Subjective   No chest pain or sob.  Inpatient Medications    Scheduled Meds: . allopurinol  300 mg Oral Daily  . [START ON 11/18/2018] aspirin  81 mg Oral Pre-Cath  . [START ON 11/19/2018] aspirin EC  81 mg Oral Daily  . atorvastatin  20 mg Oral Daily  . celecoxib  200 mg Oral Daily  . donepezil  5 mg Oral QHS  . gabapentin  100 mg Oral QHS  . levothyroxine  50 mcg Oral Q0600  . losartan  50 mg Oral Daily  . metoprolol succinate  25 mg Oral Daily  . pantoprazole  40 mg Oral Daily  . sodium chloride flush  3 mL Intravenous Q12H  . tamsulosin  0.4 mg Oral Daily  . cyanocobalamin  100 mcg Oral Daily  . [START ON 11/18/2018] Vitamin D (Ergocalciferol)  50,000 Units Oral Weekly   Continuous Infusions: . sodium chloride 10 mL/hr at 11/15/18 0417  . sodium chloride    . [START ON 11/18/2018] sodium chloride     Followed by  . [START ON 11/19/2018] sodium chloride    . heparin 1,000 Units/hr (11/17/18 0549)   PRN Meds: sodium chloride, acetaminophen, nitroGLYCERIN, nitroGLYCERIN, ondansetron (ZOFRAN) IV, sodium chloride flush   Vital Signs    Vitals:   11/16/18 1208 11/16/18 2059 11/17/18 0453 11/17/18 0954  BP: (!) 145/61 (!) 164/92 (!) 146/65 (!) 127/55  Pulse: 68 64 63 68  Resp:  19 19   Temp: 98.2 F (36.8 C) 98 F (36.7 C) 97.6 F (36.4 C)   TempSrc: Oral Oral Oral   SpO2: 100% 99% 100%   Weight:   69.2 kg   Height:        Intake/Output Summary (Last 24 hours) at 11/17/2018 1027 Last data filed at 11/16/2018 1700 Gross per 24 hour  Intake 465 ml  Output -  Net 465 ml   Filed Weights   11/15/18 0655 11/16/18 0452 11/17/18 0453  Weight: 69.4 kg 68.9 kg 69.2 kg    Telemetry    NSR - Personally Reviewed  ECG    none - Personally Reviewed  Physical Exam   GEN: No acute distress.   Neck: No JVD Cardiac: RRR, no murmurs, rubs, or gallops.   Respiratory: Clear to auscultation bilaterally. GI: Soft, nontender, non-distended  MS: No edema; No deformity. Neuro:  Nonfocal  Psych: Normal affect   Labs    Chemistry Recent Labs  Lab 11/14/18 1717 11/15/18 0617  NA 137 138  K 4.2 3.9  CL 104 106  CO2 24 23  GLUCOSE 107* 93  BUN 22 17  CREATININE 1.21 1.05  CALCIUM 9.1 9.0  PROT 6.8  --   ALBUMIN 4.3  --   AST 29  --   ALT 32  --   ALKPHOS 74  --   BILITOT 1.1  --   GFRNONAA 53* NOT CALCULATED  GFRAA >60 NOT CALCULATED  ANIONGAP 9 9     Hematology Recent Labs  Lab 11/15/18 0617 11/16/18 0232 11/17/18 0446  WBC 4.3 4.6 4.9  RBC 3.91* 3.98* 3.84*  HGB 12.7* 13.0 12.5*  HCT 37.8* 38.2* 36.9*  MCV 96.7 96.0 96.1  MCH 32.5 32.7 32.6  MCHC 33.6 34.0 33.9  RDW 12.8 12.5 12.7  PLT 90* 91* 85*    Cardiac EnzymesNo results for input(s): TROPONINI in the last 168  hours. No results for input(s): TROPIPOC in the last 168 hours.   BNP Recent Labs  Lab 11/14/18 1717  BNP 82.0     DDimer  Recent Labs  Lab 11/14/18 1728  DDIMER 0.38     Radiology    No results found.  Cardiac Studies   none  Patient Profile     85 y.o. male admitted with chest pain/[place]  Assessment & Plan    1. [place] - he will undergo left heart cath on Monday. Continue IV heparin 2. Thyroid dysfunction - dose of synthroid has been reduced. We will follow.  For questions or updates, please contact [place] Please consult [email or web address] for contact info under Cardiology/STEMI.   Signed, Cristopher Peru, MD  11/17/2018, 10:27 AM  Patient ID: Patrick [name], male   DOB: 12-29-1926, 85 y.o.   MRN: [id number]

## 2018-11-18 ENCOUNTER — Encounter (HOSPITAL_COMMUNITY)
Admission: EM | Disposition: A | Discharge status: Home / Self Care | Payer: Self-pay | Source: Home / Self Care | Attending: Cardiovascular Disease

## 2018-11-18 ENCOUNTER — Encounter (HOSPITAL_COMMUNITY): Payer: Self-pay | Admitting: Cardiovascular Disease

## 2018-11-18 DIAGNOSIS — E785 Hyperlipidemia, unspecified: Secondary | ICD-10-CM

## 2018-11-18 DIAGNOSIS — E039 Hypothyroidism, unspecified: Secondary | ICD-10-CM

## 2018-11-18 HISTORY — PX: CORONARY STENT INTERVENTION: CATH118234

## 2018-11-18 HISTORY — PX: LEFT HEART CATH AND CORS/GRAFTS ANGIOGRAPHY: CATH118250

## 2018-11-18 LAB — CBC
HCT: 37.8 % — ABNORMAL LOW (ref 39.0–52.0)
Hemoglobin: 12.5 g/dL — ABNORMAL LOW (ref 13.0–17.0)
MCH: 32.2 pg (ref 26.0–34.0)
MCHC: 33.1 g/dL (ref 30.0–36.0)
MCV: 97.4 fL (ref 80.0–100.0)
Platelets: 84 10*3/uL — ABNORMAL LOW (ref 150–400)
RBC: 3.88 MIL/uL — ABNORMAL LOW (ref 4.22–5.81)
RDW: 12.9 % (ref 11.5–15.5)
WBC: 4.5 10*3/uL (ref 4.0–10.5)
nRBC: 0 % (ref 0.0–0.2)

## 2018-11-18 LAB — POCT ACTIVATED CLOTTING TIME: Activated Clotting Time: 406 seconds

## 2018-11-18 LAB — HEPARIN LEVEL (UNFRACTIONATED): Heparin Unfractionated: 0.34 IU/mL (ref 0.30–0.70)

## 2018-11-18 SURGERY — LEFT HEART CATH AND CORS/GRAFTS ANGIOGRAPHY
Anesthesia: LOCAL

## 2018-11-18 MED ORDER — CLOPIDOGREL BISULFATE 75 MG PO TABS
75.0000 mg | ORAL_TABLET | Freq: Every day | ORAL | Status: DC
  Administered 2018-11-19: 75 mg via ORAL
  Filled 2018-11-18: qty 1

## 2018-11-18 MED ORDER — LIDOCAINE HCL (PF) 1 % IJ SOLN
INTRAMUSCULAR | Status: AC
  Filled 2018-11-18: qty 30

## 2018-11-18 MED ORDER — SODIUM CHLORIDE 0.9% FLUSH: 3.0000 mL | Freq: Two times a day (BID) | INTRAVENOUS | Status: DC

## 2018-11-18 MED ORDER — BIVALIRUDIN BOLUS VIA INFUSION - CUPID
INTRAVENOUS | Status: DC | PRN
  Administered 2018-11-18: 52.575 mg via INTRAVENOUS

## 2018-11-18 MED ORDER — LIDOCAINE HCL (PF) 1 % IJ SOLN
INTRAMUSCULAR | Status: DC | PRN
  Administered 2018-11-18: 20 mL via INTRADERMAL

## 2018-11-18 MED ORDER — MIDAZOLAM HCL 2 MG/2ML IJ SOLN
INTRAMUSCULAR | Status: AC
  Filled 2018-11-18: qty 2

## 2018-11-18 MED ORDER — HEPARIN (PORCINE) IN NACL 1000-0.9 UT/500ML-% IV SOLN
INTRAVENOUS | Status: AC
  Filled 2018-11-18: qty 1000

## 2018-11-18 MED ORDER — IOHEXOL 350 MG/ML SOLN
INTRAVENOUS | Status: DC | PRN
  Administered 2018-11-18: 105 mL via INTRA_ARTERIAL

## 2018-11-18 MED ORDER — FENTANYL CITRATE (PF) 100 MCG/2ML IJ SOLN
INTRAMUSCULAR | Status: DC | PRN
  Administered 2018-11-18 (×2): 25 ug via INTRAVENOUS

## 2018-11-18 MED ORDER — SODIUM CHLORIDE 0.9% FLUSH: 3.0000 mL | INTRAVENOUS | Status: DC | PRN

## 2018-11-18 MED ORDER — MIDAZOLAM HCL 2 MG/2ML IJ SOLN
INTRAMUSCULAR | Status: DC | PRN
  Administered 2018-11-18 (×2): 1 mg via INTRAVENOUS

## 2018-11-18 MED ORDER — LABETALOL HCL 5 MG/ML IV SOLN: 10.0000 mg | INTRAVENOUS | Status: AC | PRN

## 2018-11-18 MED ORDER — SODIUM CHLORIDE 0.9 % IV SOLN: 250.0000 mL | INTRAVENOUS | Status: DC | PRN

## 2018-11-18 MED ORDER — HEPARIN (PORCINE) IN NACL 1000-0.9 UT/500ML-% IV SOLN
INTRAVENOUS | Status: DC | PRN
  Administered 2018-11-18 (×2): 500 mL

## 2018-11-18 MED ORDER — SODIUM CHLORIDE 0.9 % IV SOLN
INTRAVENOUS | Status: DC | PRN
  Administered 2018-11-18: 1.75 mg/kg/h via INTRAVENOUS

## 2018-11-18 MED ORDER — SODIUM CHLORIDE 0.9 % WEIGHT BASED INFUSION
1.0000 mL/kg/h | INTRAVENOUS | Status: AC
  Administered 2018-11-18: 1 mL/kg/h via INTRAVENOUS

## 2018-11-18 MED ORDER — FENTANYL CITRATE (PF) 100 MCG/2ML IJ SOLN
INTRAMUSCULAR | Status: AC
  Filled 2018-11-18: qty 2

## 2018-11-18 MED ORDER — CLOPIDOGREL BISULFATE 300 MG PO TABS
ORAL_TABLET | ORAL | Status: DC | PRN
  Administered 2018-11-18: 600 mg via ORAL

## 2018-11-18 MED ORDER — BIVALIRUDIN TRIFLUOROACETATE 250 MG IV SOLR
INTRAVENOUS | Status: AC
  Filled 2018-11-18: qty 250

## 2018-11-18 MED ORDER — HYDRALAZINE HCL 20 MG/ML IJ SOLN: 10.0000 mg | INTRAMUSCULAR | Status: AC | PRN

## 2018-11-18 MED ORDER — CLOPIDOGREL BISULFATE 300 MG PO TABS
ORAL_TABLET | ORAL | Status: AC
  Filled 2018-11-18: qty 2

## 2018-11-18 SURGICAL SUPPLY — 19 items
BALLN EMERGE MR 2.5X12 (BALLOONS) ×2
BALLN SAPPHIRE ~~LOC~~ 3.75X15 (BALLOONS) ×1 IMPLANT
BALLOON EMERGE MR 2.5X12 (BALLOONS) IMPLANT
CATH INFINITI 5FR MULTPACK ANG (CATHETERS) ×1 IMPLANT
CATH VISTA GUIDE RCB (CATHETERS) ×1 IMPLANT
DEVICE CLOSURE PERCLS PRGLD 6F (VASCULAR PRODUCTS) IMPLANT
DEVICE SPIDERFX EMB PROT 5MM (WIRE) ×1 IMPLANT
KIT ENCORE 26 ADVANTAGE (KITS) ×1 IMPLANT
KIT HEART LEFT (KITS) ×2 IMPLANT
PACK CARDIAC CATHETERIZATION (CUSTOM PROCEDURE TRAY) ×2 IMPLANT
PERCLOSE PROGLIDE 6F (VASCULAR PRODUCTS) ×2
SHEATH PINNACLE 5F 10CM (SHEATH) ×1 IMPLANT
SHEATH PINNACLE 6F 10CM (SHEATH) ×1 IMPLANT
SHEATH PROBE COVER 6X72 (BAG) ×1 IMPLANT
STENT SYNERGY DES 3.5X20 (Permanent Stent) ×1 IMPLANT
TRANSDUCER W/STOPCOCK (MISCELLANEOUS) ×2 IMPLANT
TUBING CIL FLEX 10 FLL-RA (TUBING) ×2 IMPLANT
WIRE COUGAR XT STRL 190CM (WIRE) ×1 IMPLANT
WIRE EMERALD 3MM-J .035X150CM (WIRE) ×1 IMPLANT

## 2018-11-18 NOTE — H&P (View-Only) (Signed)
Progress Note  Patient Name: Patrick [name] Date of Encounter: 11/18/2018  Primary Cardiologist: Patrick Moores, MD   Subjective   No significant overnight events. Patient reports very mild shortness of breath occasionally but denies any chest pain. No palpitations, lightheadedness, or dizziness.   Patient is scheduled for left heart catheterization later today. Answered all of his questions about the procedure.   Inpatient Medications    Scheduled Meds: . allopurinol  300 mg Oral Daily  . [START ON 11/19/2018] aspirin EC  81 mg Oral Daily  . atorvastatin  20 mg Oral Daily  . celecoxib  200 mg Oral Daily  . donepezil  5 mg Oral QHS  . gabapentin  100 mg Oral QHS  . levothyroxine  50 mcg Oral Q0600  . losartan  50 mg Oral Daily  . metoprolol succinate  25 mg Oral Daily  . pantoprazole  40 mg Oral Daily  . sodium chloride flush  3 mL Intravenous Q12H  . tamsulosin  0.4 mg Oral Daily  . cyanocobalamin  100 mcg Oral Daily  . Vitamin D (Ergocalciferol)  50,000 Units Oral Weekly   Continuous Infusions: . sodium chloride 10 mL/hr at 11/15/18 0417  . sodium chloride    . [START ON 11/19/2018] sodium chloride    . heparin 1,000 Units/hr (11/17/18 2042)   PRN Meds: sodium chloride, acetaminophen, nitroGLYCERIN, nitroGLYCERIN, ondansetron (ZOFRAN) IV, sodium chloride flush   Vital Signs    Vitals:   11/17/18 0954 11/17/18 1339 11/17/18 2100 11/18/18 0458  BP: (!) 127/55 129/65 (!) 133/51 133/68  Pulse: 68 (!) 56 67 70  Resp:  16    Temp:  98 F (36.7 C) 97.7 F (36.5 C) 97.8 F (36.6 C)  TempSrc:  Oral Oral Oral  SpO2:  99% 100% 98%  Weight:    70.1 kg  Height:        Intake/Output Summary (Last 24 hours) at 11/18/2018 0840 Last data filed at 11/17/2018 2000 Gross per 24 hour  Intake 360 ml  Output -  Net 360 ml   Last 3 Weights 11/18/2018 11/17/2018 11/16/2018  Weight (lbs) 154 lb 9.6 oz 152 lb 8.9 oz 151 lb 14.4 oz  Weight (kg) 70.126 kg 69.2 kg 68.901 kg       Telemetry    Sinus rhythm with rates ranging from the high 50's to 90's and PVCs. - Personally Reviewed  ECG    No new ECG tracing today. - Personally Reviewed  Physical Exam   GEN: Elderly Caucasian male resting comfortably in no acute distress. Pleasant and cooperative.  Neck: Supple.  Cardiac: RRR. Distinct S1 and S2. No murmurs, rubs, or gallops.  Respiratory: No increased work of breathing. Clear to auscultation bilaterally. No wheezes, rhonchi, or rales.  GI: Soft, non-distended and non-tender. Bowel sounds present.  MS: No lower extremity edema. No deformities.  Neuro:  No focal deficits.  Psych: Normal affect. Responds appropriately.   Labs    High Sensitivity Troponin:   Recent Labs  Lab 11/14/18 1717 11/14/18 1806 11/15/18 0022 11/15/18 0617  TROPONINIHS _0 Cardiac EnzymesNo results for input(s): TROPONINI in the last 168 hours. No results for input(s): TROPIPOC in the last 168 hours.   Chemistry Recent Labs  Lab 11/14/18 1717 11/15/18 0617  NA 137 138  K 4.2 3.9  CL 104 106  CO2 24 23  GLUCOSE 107* 93  BUN 22 17  CREATININE 1.21 1.05  CALCIUM 9.1  9.0  PROT 6.8  --   ALBUMIN 4.3  --   AST 29  --   ALT 32  --   ALKPHOS 74  --   BILITOT 1.1  --   GFRNONAA 53* NOT CALCULATED  GFRAA >60 NOT CALCULATED  ANIONGAP 9 9     Hematology Recent Labs  Lab 11/16/18 0232 11/17/18 0446 11/18/18 0500  WBC 4.6 4.9 4.5  RBC 3.98* 3.84* 3.88*  HGB 13.0 12.5* 12.5*  HCT 38.2* 36.9* 37.8*  MCV 96.0 96.1 97.4  MCH 32.7 32.6 32.2  MCHC 34.0 33.9 33.1  RDW 12.5 12.7 12.9  PLT 91* 85* 84*    BNP Recent Labs  Lab 11/14/18 1717  BNP 82.0     DDimer  Recent Labs  Lab 11/14/18 1728  DDIMER 0.38     Radiology    No results found.  Cardiac Studies   Echocardiogram 11/15/2018: Impressions:  1. The left ventricle has normal systolic function with an ejection fraction of 60-65%. The cavity size was normal. Left ventricular diastolic  Doppler parameters are consistent with impaired relaxation. Elevated left atrial and left ventricular  end-diastolic pressures The E/e' is >15. No evidence of left ventricular regional wall motion abnormalities.  2. Left atrial size was moderately dilated.  3. The mitral valve is abnormal. Mild thickening of the mitral valve leaflet. Mild calcification of the mitral valve leaflet. There is mild to moderate mitral annular calcification present.  4. The tricuspid valve is grossly normal.  5. The aortic valve is abnormal. Moderate calcification of the aortic valve. Aortic valve regurgitation is mild to moderate by color flow Doppler. No stenosis of the aortic valve.  Summary: LVEF 60-65%, normal wall thickness, normal wall motion, grade 1 DD, elevated LV filling pressure, moderate LAE, MAC with mitral leaflet thickening and mild MR, calcified aortic valve with mild to moderate AI, normal IVC.  Patient Profile   Mr. Patrick [name] is a 85 y.o. male with a history of CAD s/p remote CABG in 1997, hypertension, hyperlipidemia, hypothyroidism, and GERD, who was admitted with unstable angina on 11/14/2018.   Assessment & Plan    Unstable Angina with Known CAD - Patient presented with unstable angina. - High-sensitivity troponin negative x4.  - Echo showed LVEF of 60-65% with normal wall motion and grade 1 diastolic dysfunction.  - Continue aspirin, beta-blocker, and statin. - Continue IV Heparin. - Scheduled for left heart catheterization later today.   Hypothyroidism - Patient has history of hypothyroidism. TSH low and free T4 high this  Admission. Home Synthroid was reduced from 88 mcg to 50 mcg.  Hypertension - Most recent BP 133/68 this morning. - Continue Losartan 43m daily and Toprol-XL 26mdaily.   Hyperlipidemia - Lipid panel this admission: Cholesterol 91, Triglycerides 39, HDL 42, LDL 41. - LDL goal <60 given CAD. - Continue home Lipitor 2097maily.    Thrombocytopenia - Platelets  this admission: 100 >> 90 >> 91 >> 85 >> 84. - Continue to monitor.   For questions or updates, please contact Patrick [name] consult [email or web address] for contact info under        Signed, Patrick [name]  11/18/2018, 8:40 AM    Personally seen and examined. Agree with above.   [age]ar old with unstable angina underlying coronary artery disease awaiting cardiac catheterization later today.  Also has comorbidities of hypertension hyperlipidemia and mild thrombocytopenia, 86 currently.  EF has been normal.  Very pleasant.  Heart regular rate and rhythm, lungs clear,  family member at bedside.  Lab work reviewed.  Candee Furbish, MD

## 2018-11-18 NOTE — Progress Notes (Addendum)
Progress Note  Patient Name: Patrick [name] Date of Encounter: 11/18/2018  Primary Cardiologist: Mertie Moores, MD   Subjective   No significant overnight events. Patient reports very mild shortness of breath occasionally but denies any chest pain. No palpitations, lightheadedness, or dizziness.   Patient is scheduled for left heart catheterization later today. Answered all of his questions about the procedure.   Inpatient Medications    Scheduled Meds: . allopurinol  300 mg Oral Daily  . [START ON 11/19/2018] aspirin EC  81 mg Oral Daily  . atorvastatin  20 mg Oral Daily  . celecoxib  200 mg Oral Daily  . donepezil  5 mg Oral QHS  . gabapentin  100 mg Oral QHS  . levothyroxine  50 mcg Oral Q0600  . losartan  50 mg Oral Daily  . metoprolol succinate  25 mg Oral Daily  . pantoprazole  40 mg Oral Daily  . sodium chloride flush  3 mL Intravenous Q12H  . tamsulosin  0.4 mg Oral Daily  . cyanocobalamin  100 mcg Oral Daily  . Vitamin D (Ergocalciferol)  50,000 Units Oral Weekly   Continuous Infusions: . sodium chloride 10 mL/hr at 11/15/18 0417  . sodium chloride    . [START ON 11/19/2018] sodium chloride    . heparin 1,000 Units/hr (11/17/18 2042)   PRN Meds: sodium chloride, acetaminophen, nitroGLYCERIN, nitroGLYCERIN, ondansetron (ZOFRAN) IV, sodium chloride flush   Vital Signs    Vitals:   11/17/18 0954 11/17/18 1339 11/17/18 2100 11/18/18 0458  BP: (!) 127/55 129/65 (!) 133/51 133/68  Pulse: 68 (!) 56 67 70  Resp:  16    Temp:  98 F (36.7 C) 97.7 F (36.5 C) 97.8 F (36.6 C)  TempSrc:  Oral Oral Oral  SpO2:  99% 100% 98%  Weight:    70.1 kg  Height:        Intake/Output Summary (Last 24 hours) at 11/18/2018 0840 Last data filed at 11/17/2018 2000 Gross per 24 hour  Intake 360 ml  Output -  Net 360 ml   Last 3 Weights 11/18/2018 11/17/2018 11/16/2018  Weight (lbs) 154 lb 9.6 oz 152 lb 8.9 oz 151 lb 14.4 oz  Weight (kg) 70.126 kg 69.2 kg 68.901 kg       Telemetry    Sinus rhythm with rates ranging from the high 50's to 90's and PVCs. - Personally Reviewed  ECG    No new ECG tracing today. - Personally Reviewed  Physical Exam   GEN: Elderly Caucasian male resting comfortably in no acute distress. Pleasant and cooperative.  Neck: Supple.  Cardiac: RRR. Distinct S1 and S2. No murmurs, rubs, or gallops.  Respiratory: No increased work of breathing. Clear to auscultation bilaterally. No wheezes, rhonchi, or rales.  GI: Soft, non-distended and non-tender. Bowel sounds present.  MS: No lower extremity edema. No deformities.  Neuro:  No focal deficits.  Psych: Normal affect. Responds appropriately.   Labs    High Sensitivity Troponin:   Recent Labs  Lab 11/14/18 1717 11/14/18 1806 11/15/18 0022 11/15/18 0617  TROPONINIHS _0 Cardiac EnzymesNo results for input(s): TROPONINI in the last 168 hours. No results for input(s): TROPIPOC in the last 168 hours.   Chemistry Recent Labs  Lab 11/14/18 1717 11/15/18 0617  NA 137 138  K 4.2 3.9  CL 104 106  CO2 24 23  GLUCOSE 107* 93  BUN 22 17  CREATININE 1.21 1.05  CALCIUM 9.1  9.0  PROT 6.8  --   ALBUMIN 4.3  --   AST 29  --   ALT 32  --   ALKPHOS 74  --   BILITOT 1.1  --   GFRNONAA 53* NOT CALCULATED  GFRAA >60 NOT CALCULATED  ANIONGAP 9 9     Hematology Recent Labs  Lab 11/16/18 0232 11/17/18 0446 11/18/18 0500  WBC 4.6 4.9 4.5  RBC 3.98* 3.84* 3.88*  HGB 13.0 12.5* 12.5*  HCT 38.2* 36.9* 37.8*  MCV 96.0 96.1 97.4  MCH 32.7 32.6 32.2  MCHC 34.0 33.9 33.1  RDW 12.5 12.7 12.9  PLT 91* 85* 84*    BNP Recent Labs  Lab 11/14/18 1717  BNP 82.0     DDimer  Recent Labs  Lab 11/14/18 1728  DDIMER 0.38     Radiology    No results found.  Cardiac Studies   Echocardiogram 11/15/2018: Impressions:  1. The left ventricle has normal systolic function with an ejection fraction of 60-65%. The cavity size was normal. Left ventricular diastolic  Doppler parameters are consistent with impaired relaxation. Elevated left atrial and left ventricular  end-diastolic pressures The E/e' is >15. No evidence of left ventricular regional wall motion abnormalities.  2. Left atrial size was moderately dilated.  3. The mitral valve is abnormal. Mild thickening of the mitral valve leaflet. Mild calcification of the mitral valve leaflet. There is mild to moderate mitral annular calcification present.  4. The tricuspid valve is grossly normal.  5. The aortic valve is abnormal. Moderate calcification of the aortic valve. Aortic valve regurgitation is mild to moderate by color flow Doppler. No stenosis of the aortic valve.  Summary: LVEF 60-65%, normal wall thickness, normal wall motion, grade 1 DD, elevated LV filling pressure, moderate LAE, MAC with mitral leaflet thickening and mild MR, calcified aortic valve with mild to moderate AI, normal IVC.  Patient Profile   Patrick [name] is a 85 y.o. male with a history of CAD s/p remote CABG in 1997, hypertension, hyperlipidemia, hypothyroidism, and GERD, who was admitted with unstable angina on 11/14/2018.   Assessment & Plan    Unstable Angina with Known CAD - Patient presented with unstable angina. - High-sensitivity troponin negative x4.  - Echo showed LVEF of 60-65% with normal wall motion and grade 1 diastolic dysfunction.  - Continue aspirin, beta-blocker, and statin. - Continue IV Heparin. - Scheduled for left heart catheterization later today.   Hypothyroidism - Patient has history of hypothyroidism. TSH low and free T4 high this  Admission. Home Synthroid was reduced from 88 mcg to 50 mcg.  Hypertension - Most recent BP 133/68 this morning. - Continue Losartan 20m daily and Toprol-XL 229mdaily.   Hyperlipidemia - Lipid panel this admission: Cholesterol 91, Triglycerides 39, HDL 42, LDL 41. - LDL goal <60 given CAD. - Continue home Lipitor 2071maily.    Thrombocytopenia - Platelets  this admission: 100 >> 90 >> 91 >> 85 >> 84. - Continue to monitor.   For questions or updates, please contact [place] [place] consult [email or web address] for contact info under        Signed, CalDarreld McleanA-C  11/18/2018, 8:40 AM    Personally seen and examined. Agree with above.   [age]ar old with unstable angina underlying coronary artery disease awaiting cardiac catheterization later today.  Also has comorbidities of hypertension hyperlipidemia and mild thrombocytopenia, 86 currently.  EF has been normal.  Very pleasant.  Heart regular rate and rhythm, lungs clear,  family member at bedside.  Lab work reviewed.  Candee Furbish, MD

## 2018-11-18 NOTE — Progress Notes (Signed)
[facility] for Heparin Indication: chest pain/ACS  Allergies  Allergen Reactions  . Halcion [Triazolam]     "Makes me crazy"  . Pravachol     Unknown   Patient Measurements: Height: 5\' 11"  (180.3 cm) Weight: 154 lb 9.6 oz (70.1 kg) IBW/kg (Calculated) : 75.3 Heparin Dosing Weight: 74.8kg  Vital Signs: Temp: 97.8 F (36.6 C) (07/06 0458) Temp Source: Oral (07/06 0458) BP: 133/68 (07/06 0458) Pulse Rate: 70 (07/06 0458)  Labs: Recent Labs    11/16/18 0232 11/17/18 0446 11/18/18 0500  HGB 13.0 12.5* 12.5*  HCT 38.2* 36.9* 37.8*  PLT 91* 85* 84*  HEPARINUNFRC 0.42 0.25* 0.34    Estimated Creatinine Clearance: 47.3 mL/min (by C-G formula based on SCr of 1.05 mg/dL).   Assessment: 85 y.o. male with chest pain for IV Heparin. Heparin level is therapeutic for goal. Platelets are 82 - low but stable. Hgb stable at 12.5. No bleeding reported. Plan for cath today.   Goal of Therapy:  Heparin level 0.3-0.7 units/ml Monitor platelets by anticoagulation protocol: Yes   Plan:  Continue Heparin 1000 units/hr Daily Heparin level and CBC while on therapy.  Follow-up Cath today  Sloan Leiter, PharmD, BCPS, BCCCP Clinical Pharmacist Please refer to [facility] for [place] numbers 11/18/2018       9:43 AM

## 2018-11-18 NOTE — Interval H&P Note (Signed)
Cath Lab Visit (complete for each Cath Lab visit)  Clinical Evaluation Leading to the Procedure:   ACS: Yes.    Non-ACS:    Anginal Classification: CCS III  Anti-ischemic medical therapy: Minimal Therapy (1 class of medications)  Non-Invasive Test Results: No non-invasive testing performed  Prior CABG: No previous CABG      History and Physical Interval Note:  11/18/2018 11:00 AM  Patrick [name]  has presented today for surgery, with the diagnosis of unstable angina.  The various methods of treatment have been discussed with the patient and family. After consideration of risks, benefits and other options for treatment, the patient has consented to  Procedure(s): LEFT HEART CATH AND CORS/GRAFTS ANGIOGRAPHY (N/A) as a surgical intervention.  The patient's history has been reviewed, patient examined, no change in status, stable for surgery.  I have reviewed the patient's chart and labs.  Questions were answered to the patient's satisfaction.     Sherren Mocha

## 2018-11-18 NOTE — Care Management Important Message (Signed)
Important Message  Patient Details  Name: Patrick [name] MRN: [id number] Date of Birth: [birth year]   Medicare Important Message Given:  Yes  Patient  Wife signed for the patient.     [place], [place] 11/18/2018, 1:41 PM

## 2018-11-19 ENCOUNTER — Encounter (HOSPITAL_COMMUNITY): Payer: Self-pay | Admitting: Physician Assistant

## 2018-11-19 DIAGNOSIS — D696 Thrombocytopenia, unspecified: Secondary | ICD-10-CM | POA: Diagnosis present

## 2018-11-19 DIAGNOSIS — Z955 Presence of coronary angioplasty implant and graft: Secondary | ICD-10-CM

## 2018-11-19 HISTORY — DX: Thrombocytopenia, unspecified: D69.6

## 2018-11-19 LAB — BASIC METABOLIC PANEL
Anion gap: 8 (ref 5–15)
BUN: 11 mg/dL (ref 8–23)
CO2: 25 mmol/L (ref 22–32)
Calcium: 9.2 mg/dL (ref 8.9–10.3)
Chloride: 104 mmol/L (ref 98–111)
Creatinine, Ser: 0.97 mg/dL (ref 0.61–1.24)
GFR calc Af Amer: >60 mL/min (ref >60)
GFR calc non Af Amer: >60 mL/min (ref >60)
Glucose, Bld: 97 mg/dL (ref 70–99)
Potassium: 3.9 mmol/L (ref 3.5–5.1)
Sodium: 137 mmol/L (ref 135–145)

## 2018-11-19 LAB — CBC
HCT: 37.6 % — ABNORMAL LOW (ref 39.0–52.0)
Hemoglobin: 12.7 g/dL — ABNORMAL LOW (ref 13.0–17.0)
MCH: 32.6 pg (ref 26.0–34.0)
MCHC: 33.8 g/dL (ref 30.0–36.0)
MCV: 96.4 fL (ref 80.0–100.0)
Platelets: 86 10*3/uL — ABNORMAL LOW (ref 150–400)
RBC: 3.9 MIL/uL — ABNORMAL LOW (ref 4.22–5.81)
RDW: 12.9 % (ref 11.5–15.5)
WBC: 5.5 10*3/uL (ref 4.0–10.5)
nRBC: 0 % (ref 0.0–0.2)

## 2018-11-19 MED ORDER — LOSARTAN POTASSIUM 50 MG PO TABS: 75.0000 mg | ORAL_TABLET | Freq: Every day | ORAL | 11 refills | Status: DC

## 2018-11-19 MED ORDER — CLOPIDOGREL BISULFATE 75 MG PO TABS: 75.0000 mg | ORAL_TABLET | Freq: Every day | ORAL | 11 refills | Status: DC

## 2018-11-19 MED ORDER — LOSARTAN POTASSIUM 50 MG PO TABS: 75.0000 mg | ORAL_TABLET | Freq: Every day | ORAL | Status: DC

## 2018-11-19 MED ORDER — LEVOTHYROXINE SODIUM 88 MCG PO TABS: 88.0000 ug | ORAL_TABLET | Freq: Every day | ORAL | 1 refills | Status: DC

## 2018-11-19 MED ORDER — LOSARTAN POTASSIUM 25 MG PO TABS
25.0000 mg | ORAL_TABLET | Freq: Once | ORAL | Status: AC
  Administered 2018-11-19: 25 mg via ORAL
  Filled 2018-11-19: qty 1

## 2018-11-19 MED ORDER — LEVOTHYROXINE SODIUM 50 MCG PO TABS: 50.0000 ug | ORAL_TABLET | Freq: Every day | ORAL | 1 refills | Status: DC

## 2018-11-19 NOTE — Discharge Instructions (Signed)
IF YOU NEED SOMETHING FOR INDIGESTION OR REFLUX, TAKE GENERIC PEPCID OR OTHER H2 BLOCKER, NOT PRILOSEC. CAN TAKE PROTONIX BUT PREFER PEPCID IF THAT WORKS.  PLEASE REMEMBER TO BRING ALL OF YOUR MEDICATIONS TO EACH OF YOUR FOLLOW-UP OFFICE VISITS.  PLEASE ATTEND ALL SCHEDULED FOLLOW-UP APPOINTMENTS.   Activity: Increase activity slowly as tolerated. You may shower, but no soaking baths (or swimming) for 1 week. No driving for 2 days. No lifting over 5 lbs for 1 week. No sexual activity for 1 week.   Wound Care: You may wash cath site gently with soap and water. Keep cath site clean and dry. If you notice pain, swelling, bleeding or pus at your cath site, please call [phone].    Cardiac Cath Site Care Refer to this sheet in the next few weeks. These instructions provide you with information on caring for yourself after your procedure. Your caregiver may also give you more specific instructions. Your treatment has been planned according to current medical practices, but problems sometimes occur. Call your caregiver if you have any problems or questions after your procedure. HOME CARE INSTRUCTIONS  You may shower 24 hours after the procedure. Remove the bandage (dressing) and gently wash the site with plain soap and water. Gently pat the site dry.   Do not apply powder or lotion to the site.   Do not sit in a bathtub, swimming pool, or whirlpool for 5 to 7 days.   No bending, squatting, or lifting anything over 10 pounds (4.5 kg) as directed by your caregiver.   Inspect the site at least twice daily.   Do not drive home if you are discharged the same day of the procedure. Have someone else drive you.   You may drive 24 hours after the procedure unless otherwise instructed by your caregiver.  What to expect:  Any bruising will usually fade within 1 to 2 weeks.   Blood that collects in the tissue (hematoma) may be painful to the touch. It should usually decrease in size and tenderness  within 1 to 2 weeks.  SEEK IMMEDIATE MEDICAL CARE IF:  You have unusual pain at the site or down the affected limb.   You have redness, warmth, swelling, or pain at the site.   You have drainage (other than a small amount of blood on the dressing).   You have chills.   You have a fever or persistent symptoms for more than 72 hours.   You have a fever and your symptoms suddenly get worse.   Your leg becomes pale, cool, tingly, or numb.   You have heavy bleeding from the site. Hold pressure on the site.  Document Released: 06/03/2010 Document Revised: 04/20/2011 Document Reviewed: 06/03/2010 [facility] Patient Information 2012 [place].

## 2018-11-19 NOTE — Progress Notes (Addendum)
CARDIAC REHAB PHASE I   PRE:  Rate/Rhythm: 90 SR  BP:  Supine:   Sitting: 145/78  Standing:    SaO2:   MODE:  Ambulation: 470 ft   POST:  Rate/Rhythm: 91 SR  BP:  Supine:   Sitting: 149/56  Standing:    SaO2: 98%RA 0845-0930 Pt shaving at sink and wife in room. Education completed with pt and wife who voiced understanding. Stressed importance of plavix with stent. Reviewed NTG [place], walking for ex, CRP 2 and gave heart healthy diet. Pt does not eat much per wife. Encouraged to watch sodium. Referred to [place] CRP 2 but pt not interested in attending. Looked for stent card. Pt stated he saw it earlier this morning. Encouraged him to discuss with RN.   Graylon Good, RN BSN  11/19/2018 9:25 AM

## 2018-11-19 NOTE — Plan of Care (Signed)
  Problem: Clinical Measurements: Goal: Will remain free from infection Outcome: Progressing Note: No s/s of infection noted.   Problem: Pain Managment: Goal: General experience of comfort will improve Outcome: Progressing Note: No s/s of pain or discomfort.   Problem: Cardiovascular: Goal: Vascular access site(s) Level 0-1 will be maintained Outcome: Progressing Note: Right groin cath site remains level 0.

## 2018-11-19 NOTE — Progress Notes (Addendum)
Progress Note  Patient Name: Patrick [name] Date of Encounter: 11/19/2018  Primary Cardiologist: Mertie Moores, MD   Subjective   No chest pain or SOB overnight. Feels well, wants to go home.  Inpatient Medications    Scheduled Meds: . allopurinol  300 mg Oral Daily  . aspirin EC  81 mg Oral Daily  . atorvastatin  20 mg Oral Daily  . clopidogrel  75 mg Oral Q breakfast  . donepezil  5 mg Oral QHS  . gabapentin  100 mg Oral QHS  . levothyroxine  50 mcg Oral Q0600  . losartan  50 mg Oral Daily  . metoprolol succinate  25 mg Oral Daily  . pantoprazole  40 mg Oral Daily  . sodium chloride flush  3 mL Intravenous Q12H  . tamsulosin  0.4 mg Oral Daily  . cyanocobalamin  100 mcg Oral Daily  . Vitamin D (Ergocalciferol)  50,000 Units Oral Weekly   Continuous Infusions: . sodium chloride 100 mL/hr at 11/18/18 1230  . sodium chloride     PRN Meds: sodium chloride, acetaminophen, nitroGLYCERIN, nitroGLYCERIN, ondansetron (ZOFRAN) IV, sodium chloride flush   Vital Signs    Vitals:   11/18/18 1238 11/18/18 1342 11/18/18 2154 11/19/18 0357  BP: (!) 162/80 (!) 181/74 (!) 169/64 (!) 156/64  Pulse: (!) 58 (!) 59 64 65  Resp: 14 15    Temp:  97.6 F (36.4 C) 97.9 F (36.6 C) 97.9 F (36.6 C)  TempSrc:  Oral Oral Oral  SpO2: 100% 100% 99% 95%  Weight:    69.4 kg  Height:        Intake/Output Summary (Last 24 hours) at 11/19/2018 0825 Last data filed at 11/18/2018 2359 Gross per 24 hour  Intake 922.31 ml  Output -  Net 922.31 ml   Last 3 Weights 11/19/2018 11/18/2018 11/17/2018  Weight (lbs) 153 lb 154 lb 9.6 oz 152 lb 8.9 oz  Weight (kg) 69.4 kg 70.126 kg 69.2 kg      Telemetry    SR, occ sinus brady 50s - Personally Reviewed  ECG    SR, HR 68, inf Q waves are old - Personally Reviewed  Physical Exam   General: Well developed, well nourished, male in no acute distress Head: Eyes PERRLA, No xanthomas.   Normocephalic and atraumatic Lungs: Clear bilaterally to  auscultation. Heart: HRRR S1 S2, without RG. Soft SEM  Pulses are 2+ & equal. No JVD. Abdomen: Bowel sounds are present, abdomen soft and non-tender without masses or  hernias noted. Msk: Normal strength and tone for age. Extremities: No clubbing, cyanosis or edema. R groin cath site w/out ecchymosis, hematoma or bruit    Skin:  No rashes or lesions noted. Neuro: Alert and oriented X 3. Psych:  Good affect, responds appropriately   Labs    High Sensitivity Troponin:   Recent Labs  Lab 11/14/18 1717 11/14/18 1806 11/15/18 0022 11/15/18 0617  TROPONINIHS _0 Chemistry Recent Labs  Lab 11/14/18 1717 11/15/18 0617 11/19/18 0337  NA 137 138 137  K 4.2 3.9 3.9  CL 104 106 104  CO2 _1 GLUCOSE 107* 93 97  BUN _2 CREATININE 1.21 1.05 0.97  CALCIUM 9.1 9.0 9.2  PROT 6.8  --   --   ALBUMIN 4.3  --   --   AST 29  --   --   ALT 32  --   --  ALKPHOS 74  --   --   BILITOT 1.1  --   --   GFRNONAA 53* NOT CALCULATED >60  GFRAA >60 NOT CALCULATED >60  ANIONGAP _0 Hematology Recent Labs  Lab 11/17/18 0446 11/18/18 0500 11/19/18 0337  WBC 4.9 4.5 5.5  RBC 3.84* 3.88* 3.90*  HGB 12.5* 12.5* 12.7*  HCT 36.9* 37.8* 37.6*  MCV 96.1 97.4 96.4  MCH 32.6 32.2 32.6  MCHC 33.9 33.1 33.8  RDW 12.7 12.9 12.9  PLT 85* 84* 86*    BNP Recent Labs  Lab 11/14/18 1717  BNP 82.0     DDimer  Recent Labs  Lab 11/14/18 1728  DDIMER 0.38    Lab Results  Component Value Date   TSH <0.010 (L) 11/15/2018   Free T4  Date Value Ref Range Status  11/15/2018 1.53 (H) 0.61 - 1.12 ng/dL Final    Comment:    (NOTE) Biotin ingestion may interfere with free T4 tests. If the results are inconsistent with the TSH level, previous test results, or the clinical presentation, then consider biotin interference. If needed, order repeat testing after stopping biotin. Performed at [facility], [place] [address], [place], [place] [address]    Lab  Results  Component Value Date   CHOL 91 11/15/2018   HDL 42 11/15/2018   LDLCALC 41 11/15/2018   TRIG 39 11/15/2018   CHOLHDL 2.2 11/15/2018     Radiology    No results found.  Cardiac Studies   CARDIAC CATH: 11/18/2018 1.  Severe native vessel coronary artery disease with total occlusion of the RCA, total occlusion of left circumflex, and severe stenosis leading into total occlusion of the mid LAD 2.  Status post multivessel coronary bypass surgery with continued patency of the saphenous vein graft RCA, sequential saphenous vein graft to OM1 and distal circumflex, and LIMA to diagonal and mid LAD 3.  Severe stenosis involving the ostium/proximal portion of the SVG to distal RCA, treated successfully with a 3.5 x 20 mm Synergy DES utilizing distal embolic protection  Recommendations: Dual antiplatelet therapy with aspirin and clopidogrel 12 months as tolerated Intervention     Echocardiogram 11/15/2018: Impressions:  1. The left ventricle has normal systolic function with an ejection fraction of 60-65%. The cavity size was normal. Left ventricular diastolic Doppler parameters are consistent with impaired relaxation. Elevated left atrial and left ventricular  end-diastolic pressures The E/e' is >15. No evidence of left ventricular regional wall motion abnormalities.  2. Left atrial size was moderately dilated.  3. The mitral valve is abnormal. Mild thickening of the mitral valve leaflet. Mild calcification of the mitral valve leaflet. There is mild to moderate mitral annular calcification present.  4. The tricuspid valve is grossly normal.  5. The aortic valve is abnormal. Moderate calcification of the aortic valve. Aortic valve regurgitation is mild to moderate by color flow Doppler. No stenosis of the aortic valve.  Summary: LVEF 60-65%, normal wall thickness, normal wall motion, grade 1 DD, elevated LV filling pressure, moderate LAE, MAC with mitral leaflet thickening and  mild MR, calcified aortic valve with mild to moderate AI, normal IVC.  Patient Profile   Mr. Delrossi is a 85 y.o. male with a history of CAD s/p remote CABG in 1997, hypertension, hyperlipidemia, hypothyroidism, and GERD, who was admitted with unstable angina on 11/14/2018.   Assessment & Plan    Unstable Angina with Known CAD - Unstable angina w/ neg trop -  s/p cath & DES SVG-RCA - EF nl on echo w/ grade 1 dd - on ASA, BB, high-dose statin - cardiac rehab to see  Hypothyroidism - Synthroid dose reduced due to low TSH & high free T4 - f/u PCP  Hypertension - BP generally elevated SBP 133-181 last 24 hr - no increase in Toprol w/ resting SB - will increase losartan to 75 mg qd  Hyperlipidemia - lipid profile above, LDL at goal -continue statin   Thrombocytopenia - Plt 100 on admit, nadir 84, today 86 - no bleeding issues   For questions or updates, please contact [facility] Please consult [email or web address] for contact info under    Possible d/c    Signed, Rosaria Ferries, PA-C  11/19/2018, 8:25 AM    Personally seen and examined. Agree with above.  SVG to RCA stent-wife at bedside. DAPT Cath site unremarkable. Okay with increasing losartan to 75. I think given his advanced age that atorvastatin 20 mg is reasonable. Cardiac rehab, follow-up 2 weeks.  OK with DC  Candee Furbish, MD

## 2018-11-19 NOTE — Discharge Summary (Addendum)
Discharge Summary    Patient ID: Patrick [name],  MRN: [id number], DOB/AGE: 1926-12-01 85 y.o.  Admit date: 11/14/2018 Discharge date: 11/19/2018  Primary Care Provider: Marton Redwood Primary Cardiologist: Mertie Moores, MD   Discharge Diagnoses    Principal Problem:   Unstable angina [facility]) Active Problems:   Hyperlipemia   Hypothyroidism   Essential (primary) hypertension   Thrombocytopenia ([name])   Allergies Allergies  Allergen Reactions  . Halcion [Triazolam]     "Makes me crazy"  . Pravachol     Unknown    Diagnostic Studies/Procedures    CARDIAC CATH: 11/18/2018 1.  Severe native vessel coronary artery disease with total occlusion of the RCA, total occlusion of left circumflex, and severe stenosis leading into total occlusion of the mid LAD 2.  Status post multivessel coronary bypass surgery with continued patency of the saphenous vein graft RCA, sequential saphenous vein graft to OM1 and distal circumflex, and LIMA to diagonal and mid LAD 3.  Severe stenosis involving the ostium/proximal portion of the SVG to distal RCA, treated successfully with a 3.5 x 20 mm Synergy DES utilizing distal embolic protection  Recommendations: Dual antiplatelet therapy with aspirin and clopidogrel 12 months as tolerated Intervention   ECHO: 11/15/2018   1. The left ventricle has normal systolic function with an ejection fraction of 60-65%. The cavity size was normal. Left ventricular diastolic Doppler parameters are consistent with impaired relaxation. Elevated left atrial and left ventricular  end-diastolic pressures The E/e' is >15. No evidence of left ventricular regional wall motion abnormalities.  2. Left atrial size was moderately dilated.  3. The mitral valve is abnormal. Mild thickening of the mitral valve leaflet. Mild calcification of the mitral valve leaflet. There is mild to moderate mitral annular calcification present.  4. The tricuspid valve is grossly normal.   5. The aortic valve is abnormal. Moderate calcification of the aortic valve. Aortic valve regurgitation is mild to moderate by color flow Doppler. No stenosis of the aortic valve.  _____________   History of Present Illness     Patrick [name] is a 85 y.o. male with a history of CAD s/p remote CABG in 1997, hypertension, hyperlipidemia, hypothyroidism, and GERD, who was admitted with unstable angina on 11/14/2018.   Hospital Course     Consultants: None   Mr. Mensch was pain-free on aspirin, heparin and as needed nitrates.  His cardiac enzymes were negative for MI and his ECG was nonacute.  An echocardiogram showed preserved left ventricular function with no wall motion abnormalities, but he has calcium on both mitral and aortic valves.    He was taken to the Cath Lab on 11/18/2018, results are above.  He had DES to the SVG-distal RCA, and tolerated the procedure well.  He was noted to have thrombocytopenia, and his platelets dropped from 100 down to a nadir of 84, were back up to 86 at discharge.  He will need a CBC at his follow-up appointment.  There were no bleeding issues.  The patient and his wife made aware of the thrombocytopenia, and told to watch for bleeding.  His blood pressure was elevated, he was on metoprolol, but the dose could not be increased because of resting sinus bradycardia.  His losartan was increased for better blood pressure control with further titration as needed as an outpatient.  A lipid profile was checked, results are below.  His LDL is at goal, continue current statin dose.  On 7/7, he was seen by Dr.  Hartley Wyke and all data were reviewed.  No further inpatient work-up was indicated and he is considered stable for discharge, to follow-up as an outpatient.  _____________  Discharge Vitals Blood pressure (!) 156/64, pulse 65, temperature 97.9 F (36.6 C), temperature source Oral, resp. rate 15, height 5\' 11"  (1.803 m), weight 69.4 kg, SpO2 95 %.  Filed Weights    11/17/18 0453 11/18/18 0458 11/19/18 0357  Weight: 69.2 kg 70.1 kg 69.4 kg    Labs & Radiologic Studies    CBC Recent Labs    11/18/18 0500 11/19/18 0337  WBC 4.5 5.5  HGB 12.5* 12.7*  HCT 37.8* 37.6*  MCV 97.4 96.4  PLT 84* 86*   Basic Metabolic Panel Recent Labs    11/19/18 0337  NA 137  K 3.9  CL 104  CO2 25  GLUCOSE 97  BUN 11  CREATININE 0.97  CALCIUM 9.2    High Sensitivity Troponin:   Recent Labs  Lab 11/14/18 1717 11/14/18 1806 11/15/18 0022 11/15/18 0617  TROPONINIHS 8 8 16 11      BNP    Component Value Date/Time   BNP 82.0 11/14/2018 1717   Fasting Lipid Panel Cholesterol, Total  Date Value Ref Range Status  10/25/2017 155 100 - 199 mg/dL Final   Cholesterol  Date Value Ref Range Status  11/15/2018 91 0 - 200 mg/dL Final   HDL  Date Value Ref Range Status  11/15/2018 42 >40 mg/dL Final  10/25/2017 39 (L) >39 mg/dL Final   LDL Calculated  Date Value Ref Range Status  10/25/2017 93 0 - 99 mg/dL Final   LDL Cholesterol  Date Value Ref Range Status  11/15/2018 41 0 - 99 mg/dL Final    Comment:           Total Cholesterol/HDL:CHD Risk Coronary Heart Disease Risk Table                     Men   Women  1/2 Average Risk   3.4   3.3  Average Risk       5.0   4.4  2 X Average Risk   9.6   7.1  3 X Average Risk  23.4   11.0        Use the calculated Patient Ratio above and the CHD Risk Table to determine the patient's CHD Risk.        ATP III CLASSIFICATION (LDL):  <100     mg/dL   Optimal  100-129  mg/dL   Near or Above                    Optimal  130-159  mg/dL   Borderline  160-189  mg/dL   High  >190     mg/dL   Very High Performed at [place] [address] [place], [place] [address]    Triglycerides  Date Value Ref Range Status  11/15/2018 39 <150 mg/dL Final   Thyroid Function Tests TSH  Date Value Ref Range Status  11/15/2018 <0.010 (L) 0.350 - 4.500 uIU/mL Final    Comment:    Performed by a 3rd  Generation assay with a functional sensitivity of <=0.01 uIU/mL. REPEATED TO VERIFY Performed at [facility], [place] [address], [place], [place] [address]   09/08/2008 8.355Test methodology is 3rd generation TSH** (H) 0.350 - 4.500 uIU/mL Final   _____________  Dg Chest Port 1 View  Result Date: 11/14/2018 CLINICAL DATA:  Chest pain EXAM: PORTABLE CHEST 1 VIEW COMPARISON:  02/12/2017 FINDINGS: The patient is status post prior median sternotomy. There is no pneumothorax. No large pleural effusion. There is a minimal amount of scarring at the lung bases bilaterally. No acute osseous abnormality. IMPRESSION: No active disease. Electronically Signed   By: Constance Holster M.D.   On: 11/14/2018 21:22   Disposition   Pt is being discharged home today in good condition.  Follow-up Plans & Appointments     Discharge Instructions    Amb Referral to Cardiac Rehabilitation   Complete by: As directed    Referring to [place] CRP 2   Diagnosis: Coronary Stents   After initial evaluation and assessments completed: Virtual Based Care may be provided alone or in conjunction with Phase 2 Cardiac Rehab based on patient barriers.: Yes   Diet - low sodium heart healthy   Complete by: As directed    Increase activity slowly   Complete by: As directed       Discharge Medications   Allergies as of 11/19/2018      Reactions   Halcion [triazolam]    "Makes me crazy"   Pravachol    Unknown      Medication List    STOP taking these medications   celecoxib 200 MG capsule Commonly known as: CELEBREX     TAKE these medications   allopurinol 300 MG tablet Commonly known as: ZYLOPRIM Take 300 mg by mouth daily.   aspirin 81 MG tablet Take 81 mg by mouth daily.   atorvastatin 20 MG tablet Commonly known as: LIPITOR Take 1 tablet (20 mg total) by mouth daily.   clopidogrel 75 MG tablet Commonly known as: PLAVIX Take 1 tablet (75 mg total) by mouth daily with breakfast. Start  taking on: November 20, 2018   donepezil 10 MG tablet Commonly known as: ARICEPT Take 10 mg by mouth at bedtime.   DULoxetine 60 MG capsule Commonly known as: CYMBALTA Take 60 mg by mouth daily.   levothyroxine 50 MCG tablet Commonly known as: SYNTHROID Take 1 tablet (50 mcg total) by mouth daily before breakfast. What changed:   medication strength  how much to take   losartan 50 MG tablet Commonly known as: COZAAR Take 1.5 tablets (75 mg total) by mouth daily. What changed: how much to take   metoprolol succinate 25 MG 24 hr tablet Commonly known as: TOPROL-XL Take 25 mg by mouth daily.   nitroGLYCERIN 0.4 MG SL tablet Commonly known as: NITROSTAT PLACE 1 TABLET (0.4 MG TOTAL) UNDER THE TONGUE EVERY 5 (FIVE) MINUTES AS NEEDED.   tamsulosin 0.4 MG Caps capsule Commonly known as: FLOMAX Take 0.8 mg by mouth daily.   Vitamin D (Ergocalciferol) 1.25 MG (50000 UT) Caps capsule Commonly known as: DRISDOL Take 50,000 Units by mouth once a week.         Outstanding Labs/Studies   None  Duration of Discharge Encounter   Greater than 30 minutes including physician time.  Alcario Drought Barrett NP 11/19/2018, 12:54 PM  Personally seen and examined. Agree with above.  SVG to RCA stent-wife at bedside. DAPT Cath site unremarkable. Okay with increasing losartan to 75. I think given his advanced age that atorvastatin 20 mg is reasonable. Cardiac rehab, follow-up 2 weeks.  OK with DC  Candee Furbish, MD

## 2018-11-26 ENCOUNTER — Telehealth: Payer: Self-pay | Admitting: Nurse Practitioner

## 2018-11-26 NOTE — Telephone Encounter (Signed)
[name] [name] has reached out to me personally this morning. Fraser Din is not doing well. Very weak and washed out. Seems to have had no improvement with PCI in regards to his symptoms.   Will see him tomorrow on my schedule.   Burtis Junes, RN, [place] [address] [place], [place]  [phone]

## 2018-11-26 NOTE — Progress Notes (Signed)
CARDIOLOGY OFFICE NOTE  Date:  11/27/2018    Patrick [name] Date of Birth: 1926/12/01 Medical Record #[id number]  PCP:  Marton Redwood, MD  Cardiologist:  [facility]    Chief Complaint  Patient presents with  . Shortness of Breath  . Fatigue    Post hospital visit - seen for Dr. Acie Fredrickson    History of Present Illness: Patrick [name] is a 85 y.o. male who presents today for a post hospital/work in visit. Seen for Dr. Acie Fredrickson. He is a former patient of Dr. Susa Simmonds.   He has a known history of CAD with remote CABG from 31 (Dr. Redmond Pulling), HTN, HLD, hypothyroidism and GERD.   He was last seen by Dr. Acie Fredrickson in January of 2020.   His wife reached out to me just before the July 4th holiday weekend - he was having marked SOB and unstable angina - had not been doing well for several weeks - he was referred for admission.  His cardiac enzymes were negative for MI and his ECG was nonacute.  An echocardiogram showed preserved left ventricular function with no wall motion abnormalities, but there is calcium on both mitral and aortic valves.   He underwent cardiac cath with Dr. Burt Knack on 11/18/2018 with DES to the SVG-distal RCA. He was noted to have thrombocytopenia - with no bleeding issues. BP was elevated and his ARB was increased. He has resting bradycardia and is on beta blocker therapy - this was not changed.  His TSH was markedly low - looks like his dose was cut back. Looks like he was on 112 mcg of Synthroid and this was cut to 50 mcg.   Wife reached out to me yesterday - still with marked fatigue, no endurance and continued DOE. Thus added to my schedule for today. Concern was for dehydration - Toprol and Losartan were to be held. Synthroid was held this morning.   The patient does not have symptoms concerning for COVID-19 infection (fever, chills, cough, or new shortness of breath).   Comes in today. Here with his wife today. Medicines are a little mixed up. She got  mixed up about the Losartan - thought the discharge paper said to NOT take - so he has not been taking. She also has not been giving him the Cymbalta for the past 3 days. He remains weak. Took 3 tries to get out of bed. Takes an hour to shave. Just exhausted. Sleeping too much. Significant weight loss. Says the dose of Synthroid has been cut back - they are to see Dr. Brigitte Pulse tomorrow. He has no more "cramps" in his chest - which sounds like that was his angina. Not able to be very active. Bruising. No falls.   Past Medical History:  Diagnosis Date  . BPH (benign prostatic hypertrophy)   . Diverticulosis   . ED (erectile dysfunction)   . GERD (gastroesophageal reflux disease)   . Gout   . Hepatitis    hx hepatitis after mono as teenager  . History of kidney stones   . History of skin cancer   . Hyperlipidemia   . Hypertension   . Hypothyroidism   . IHD (ischemic heart disease)    Remote CABG in 1997  . MI, acute, non ST segment elevation ([place]) 2010   s/p cath with occluded SVG to PD that fills by collaterals and the remainder of his revasculsarization is satisfactory. "the first time they did the stent , the second time  they did the graft 8 vessels"  . Nocturia   . Thrombocytopenia ([place]) 11/19/2018  . Urinary frequency     Past Surgical History:  Procedure Laterality Date  . APPENDECTOMY    . CARDIAC CATHETERIZATION  09/08/2008   NORMAL. EF 60%; Occluded SVG to PD that fills by collaterals and the remainder of his revascularization is satisfactory. he is managed medically.   . CORONARY ARTERY BYPASS GRAFT  1997   CABG x 8  . CORONARY STENT INTERVENTION N/A 11/18/2018   Procedure: CORONARY STENT INTERVENTION;  Surgeon: Sherren Mocha, MD;  Location: [facility];  Service: Cardiovascular;  Laterality: N/A;  . CYSTOSCOPY WITH INSERTION OF UROLIFT N/A 03/19/2015   Procedure: CYSTOSCOPY WITH INSERTION OF FOUR UROLIFTS;  Surgeon: Carolan Clines, MD;  Location: [facility];  Service:  Urology;  Laterality: N/A;  . ESOPHAGOGASTRODUODENOSCOPY (EGD) WITH PROPOFOL N/A 02/12/2017   Procedure: ESOPHAGOGASTRODUODENOSCOPY (EGD) WITH PROPOFOL;  Surgeon: Otis Brace, MD;  Location: [facility];  Service: Gastroenterology;  Laterality: N/A;  . ESOPHAGOGASTRODUODENOSCOPY (EGD) WITH PROPOFOL N/A 07/13/2018   Procedure: ESOPHAGOGASTRODUODENOSCOPY (EGD) WITH PROPOFOL;  Surgeon: Clarene Essex, MD;  Location: [facility];  Service: Endoscopy;  Laterality: N/A;  . FOREIGN BODY REMOVAL N/A 07/13/2018   Procedure: FOREIGN BODY REMOVAL;  Surgeon: Clarene Essex, MD;  Location: [facility];  Service: Endoscopy;  Laterality: N/A;  . LEFT HEART CATH AND CORS/GRAFTS ANGIOGRAPHY N/A 11/18/2018   Procedure: LEFT HEART CATH AND CORS/GRAFTS ANGIOGRAPHY;  Surgeon: Sherren Mocha, MD;  Location: [facility];  Service: Cardiovascular;  Laterality: N/A;     Medications: Current Meds  Medication Sig  . allopurinol (ZYLOPRIM) 300 MG tablet Take 300 mg by mouth daily.  Marland Kitchen aspirin 81 MG tablet Take 81 mg by mouth daily.    Marland Kitchen atorvastatin (LIPITOR) 20 MG tablet Take 1 tablet (20 mg total) by mouth daily.  . clopidogrel (PLAVIX) 75 MG tablet Take 1 tablet (75 mg total) by mouth daily with breakfast.  . donepezil (ARICEPT) 10 MG tablet Take 10 mg by mouth at bedtime.   . nitroGLYCERIN (NITROSTAT) 0.4 MG SL tablet PLACE 1 TABLET (0.4 MG TOTAL) UNDER THE TONGUE EVERY 5 (FIVE) MINUTES AS NEEDED.  Marland Kitchen tamsulosin (FLOMAX) 0.4 MG CAPS capsule Take 0.8 mg by mouth daily.   . Vitamin D, Ergocalciferol, (DRISDOL) 50000 units CAPS capsule Take 50,000 Units by mouth once a week.  . [DISCONTINUED] DULoxetine (CYMBALTA) 60 MG capsule Take 60 mg by mouth daily.  . [DISCONTINUED] levothyroxine (SYNTHROID) 50 MCG tablet Take 1 tablet (50 mcg total) by mouth daily before breakfast.  . [DISCONTINUED] metoprolol succinate (TOPROL-XL) 25 MG 24 hr tablet Take 25 mg by mouth daily.     Allergies: Allergies  Allergen  Reactions  . Halcion [Triazolam]     "Makes me crazy"  . Pravachol     Unknown    Social History: The patient  reports that he quit smoking about 60 years ago. His smoking use included cigarettes. He has a 15.00 pack-year smoking history. He has never used smokeless tobacco. He reports current alcohol use. He reports that he does not use drugs.   Family History: The patient's family history includes Heart failure (age of onset: 46) in his mother.   Review of Systems: Please see the history of present illness.   All other systems are reviewed and negative.   Physical Exam: VS:  Ht 5\' 10"  (1.778 m)   Wt 153 lb 12.8 oz (69.8 kg)   BMI 22.07  kg/m  .  BMI Body mass index is 22.07 kg/m.  Wt Readings from Last 3 Encounters:  11/27/18 153 lb 12.8 oz (69.8 kg)  11/19/18 153 lb (69.4 kg)  07/13/18 165 lb (74.8 kg)   Lying BP is 121/61 with HR of 58 Sitting BP is 110/58 with HR of 60 Standing Bp is 88/46 with HR of 71 Not able to stand for 3 minutes for BP/HR  General: Pleasant. Quite thin.  HEENT: Normal.  Neck: Supple, no JVD, carotid bruits, or masses noted.  Cardiac: Regular rate and rhythm. No murmurs, rubs, or gallops. No edema.  Respiratory:  Lungs are clear to auscultation bilaterally with normal work of breathing.  GI: Soft and nontender.  MS: No deformity or atrophy. Gait and ROM intact.  Skin: Warm and dry. Color is normal. Lots of bruising.  Neuro:  Strength and sensation are intact and no gross focal deficits noted.  Psych: Alert, appropriate and with normal affect.   LABORATORY DATA:  EKG:  EKG is ordered today. This demonstrates sinus bradycardia - HR of 58, inferior Q's noted. Unchanged from prior tracings. No acute changes.   Lab Results  Component Value Date   WBC 5.5 11/19/2018   HGB 12.7 (L) 11/19/2018   HCT 37.6 (L) 11/19/2018   PLT 86 (L) 11/19/2018   GLUCOSE 97 11/19/2018   CHOL 91 11/15/2018   TRIG 39 11/15/2018   HDL 42 11/15/2018   LDLCALC 41  11/15/2018   ALT 32 11/14/2018   AST 29 11/14/2018   NA 137 11/19/2018   K 3.9 11/19/2018   CL 104 11/19/2018   CREATININE 0.97 11/19/2018   BUN 11 11/19/2018   CO2 25 11/19/2018   TSH <0.010 (L) 11/15/2018   INR 1.1 09/08/2008     BNP (last 3 results) Recent Labs    11/14/18 1717  BNP 82.0    ProBNP (last 3 results) No results for input(s): PROBNP in the last 8760 hours.   Other Studies Reviewed Today:  CARDIAC CATH: 11/18/2018 1. Severe native vessel coronary artery disease with total occlusion of the RCA, total occlusion of left circumflex, and severe stenosis leading into total occlusion of the mid LAD 2. Status post multivessel coronary bypass surgery with continued patency of the saphenous vein graft RCA, sequential saphenous vein graft to OM1 and distal circumflex, and LIMA to diagonal and mid LAD 3. Severe stenosis involving the ostium/proximal portion of the SVG to distal RCA, treated successfully with a 3.5 x 20 mm Synergy DES utilizing distal embolic protection  Recommendations: Dual antiplatelet therapy with aspirin and clopidogrel 12 months as tolerated Intervention   ECHO: 11/15/2018  1. The left ventricle has normal systolic function with an ejection fraction of 60-65%. The cavity size was normal. Left ventricular diastolic Doppler parameters are consistent with impaired relaxation. Elevated left atrial and left ventricular  end-diastolic pressures The E/e' is >15. No evidence of left ventricular regional wall motion abnormalities. 2. Left atrial size was moderately dilated. 3. The mitral valve is abnormal. Mild thickening of the mitral valve leaflet. Mild calcification of the mitral valve leaflet. There is mild to moderate mitral annular calcification present. 4. The tricuspid valve is grossly normal. 5. The aortic valve is abnormal. Moderate calcification of the aortic valve. Aortic valve regurgitation is mild to moderate by color flow Doppler.  No stenosis of the aortic valve.  Assessment/Plan:  1. Unstable angina - s/p cath with PCI/DES to the SVG-distal RCA - sounds like his angina is  now resolved.   2. Persistent pre syncope - he is orthostatic - he has not been on his ARB - stopping beta blocker today. Ok to liberalize salt for now.   3. Profound fatigue - stopping beta blocker and continue to stay off Cymbalta   4. Markedly abnormal TSH - looks very hyperthyroid - repeat lab today - dose is being held - he has follow up with Dr. Brigitte Pulse tomorrow - hopefully our labs will be back and we can forward on  5. HTN - now orthostatic - see above.   6. HLD - on statin  7. Memory disorder - has been on Aricept for about a year now.   8. COVID-19 Education: The signs and symptoms of COVID-19 were discussed with the patient and how to seek care for testing (follow up with PCP or arrange E-visit).  The importance of social distancing, staying at home, hand hygiene and wearing a mask when out in public were discussed today.  Current medicines are reviewed with the patient today.  The patient does not have concerns regarding medicines other than what has been noted above.  The following changes have been made:  See above.  Labs/ tests ordered today include:    Orders Placed This Encounter  Procedures  . Comprehensive metabolic panel  . TSH  . CBC  . T3, free     Disposition:   FU with me in about 2 weeks.   Patient is agreeable to this plan and will call if any problems develop in the interim.   SignedTruitt Merle, NP  11/27/2018 3:41 PM  [facility] [address] [place], [place]  [address] Phone: [phone] Fax: [phone]

## 2018-11-26 NOTE — Telephone Encounter (Signed)
New Message     Called to confirm appt and answer covid questions  Call was answered and appt was confirmed and call disconnected when I asked the Covid questions  Unable to reach back

## 2018-11-27 ENCOUNTER — Encounter: Payer: Self-pay | Admitting: Nurse Practitioner

## 2018-11-27 ENCOUNTER — Ambulatory Visit (INDEPENDENT_AMBULATORY_CARE_PROVIDER_SITE_OTHER): Payer: Medicare Other | Admitting: Nurse Practitioner

## 2018-11-27 ENCOUNTER — Other Ambulatory Visit: Payer: Self-pay

## 2018-11-27 ENCOUNTER — Ambulatory Visit: Payer: Medicare Other | Admitting: Physician Assistant

## 2018-11-27 VITALS — Ht 70.0 in | Wt 153.8 lb

## 2018-11-27 DIAGNOSIS — I2510 Atherosclerotic heart disease of native coronary artery without angina pectoris: Secondary | ICD-10-CM | POA: Diagnosis not present

## 2018-11-27 DIAGNOSIS — I951 Orthostatic hypotension: Secondary | ICD-10-CM | POA: Diagnosis not present

## 2018-11-27 DIAGNOSIS — R5383 Other fatigue: Secondary | ICD-10-CM | POA: Diagnosis not present

## 2018-11-27 DIAGNOSIS — E782 Mixed hyperlipidemia: Secondary | ICD-10-CM

## 2018-11-27 DIAGNOSIS — E0590 Thyrotoxicosis, unspecified without thyrotoxic crisis or storm: Secondary | ICD-10-CM | POA: Diagnosis not present

## 2018-11-27 DIAGNOSIS — I200 Unstable angina: Secondary | ICD-10-CM

## 2018-11-27 NOTE — Patient Instructions (Addendum)
After Visit Summary:  We will be checking the following labs today - CMET, CBC, TSH, T3   Medication Instructions:    Continue with your current medicines. BUT  I am stopping Toprol  Stay off Losartan  Stay off Synthroid for now  Stay off Cymbalta   If you need a refill on your cardiac medications before your next appointment, please call your pharmacy.     Testing/Procedures To Be Arranged:  N/A  Follow-Up:   See me in about 2 weeks    At [facility], you and your health needs are our priority.  As part of our continuing mission to provide you with exceptional heart care, we have created designated Provider Care Teams.  These Care Teams include your primary Cardiologist (physician) and Advanced Practice Providers (APPs -  Physician Assistants and Nurse Practitioners) who all work together to provide you with the care you need, when you need it.  Special Instructions:  . Stay safe, stay home, wash your hands for at least 20 seconds and wear a mask when out in public.  . It was good to talk with you today.    Call the [place] office at [phone] if you have any questions, problems or concerns.

## 2018-11-28 DIAGNOSIS — I10 Essential (primary) hypertension: Secondary | ICD-10-CM | POA: Diagnosis not present

## 2018-11-28 DIAGNOSIS — Z9861 Coronary angioplasty status: Secondary | ICD-10-CM | POA: Diagnosis not present

## 2018-11-28 DIAGNOSIS — R634 Abnormal weight loss: Secondary | ICD-10-CM | POA: Diagnosis not present

## 2018-11-28 DIAGNOSIS — E538 Deficiency of other specified B group vitamins: Secondary | ICD-10-CM | POA: Diagnosis not present

## 2018-11-28 DIAGNOSIS — E0590 Thyrotoxicosis, unspecified without thyrotoxic crisis or storm: Secondary | ICD-10-CM | POA: Diagnosis not present

## 2018-11-28 DIAGNOSIS — D696 Thrombocytopenia, unspecified: Secondary | ICD-10-CM | POA: Diagnosis not present

## 2018-11-28 DIAGNOSIS — F329 Major depressive disorder, single episode, unspecified: Secondary | ICD-10-CM | POA: Diagnosis not present

## 2018-11-28 DIAGNOSIS — R5383 Other fatigue: Secondary | ICD-10-CM | POA: Diagnosis not present

## 2018-11-28 LAB — COMPREHENSIVE METABOLIC PANEL
ALT: 14 IU/L (ref 0–44)
AST: 12 IU/L (ref 0–40)
Albumin/Globulin Ratio: 2.4 — ABNORMAL HIGH (ref 1.2–2.2)
Albumin: 4.6 g/dL (ref 3.6–4.6)
Alkaline Phosphatase: 75 IU/L (ref 39–117)
BUN/Creatinine Ratio: 17 (ref 10–24)
BUN: 18 mg/dL (ref 8–27)
Bilirubin Total: 0.7 mg/dL (ref 0.0–1.2)
CO2: 24 mmol/L (ref 20–29)
Calcium: 9.1 mg/dL (ref 8.6–10.2)
Chloride: 101 mmol/L (ref 96–106)
Creatinine, Ser: 1.07 mg/dL (ref 0.76–1.27)
GFR calc Af Amer: 71 mL/min/{1.73_m2} (ref >59)
GFR calc non Af Amer: 61 mL/min/{1.73_m2} (ref >59)
Globulin, Total: 1.9 g/dL (ref 1.5–4.5)
Glucose: 107 mg/dL — ABNORMAL HIGH (ref 65–99)
Potassium: 4.3 mmol/L (ref 3.5–5.2)
Sodium: 138 mmol/L (ref 134–144)
Total Protein: 6.5 g/dL (ref 6.0–8.5)

## 2018-11-28 LAB — CBC
Hematocrit: 39.4 % (ref 37.5–51.0)
Hemoglobin: 13.5 g/dL (ref 13.0–17.7)
MCH: 32 pg (ref 26.6–33.0)
MCHC: 34.3 g/dL (ref 31.5–35.7)
MCV: 93 fL (ref 79–97)
Platelets: 131 10*3/uL — ABNORMAL LOW (ref 150–450)
RBC: 4.22 x10E6/uL (ref 4.14–5.80)
RDW: 12.9 % (ref 11.6–15.4)
WBC: 6 10*3/uL (ref 3.4–10.8)

## 2018-11-28 LAB — TSH: TSH: 0.018 u[IU]/mL — ABNORMAL LOW (ref 0.450–4.500)

## 2018-11-28 LAB — T3, FREE: T3, Free: 3 pg/mL (ref 2.0–4.4)

## 2018-12-02 ENCOUNTER — Encounter (HOSPITAL_BASED_OUTPATIENT_CLINIC_OR_DEPARTMENT_OTHER): Payer: Self-pay

## 2018-12-02 ENCOUNTER — Other Ambulatory Visit: Payer: Self-pay

## 2018-12-02 ENCOUNTER — Emergency Department (HOSPITAL_BASED_OUTPATIENT_CLINIC_OR_DEPARTMENT_OTHER)
Admission: EM | Admit: 2018-12-02 | Discharge: 2018-12-02 | Disposition: A | Discharge status: Home / Self Care | Payer: Medicare Other | Attending: Emergency Medicine | Admitting: Emergency Medicine

## 2018-12-02 ENCOUNTER — Telehealth: Payer: Self-pay | Admitting: Nurse Practitioner

## 2018-12-02 ENCOUNTER — Emergency Department (HOSPITAL_BASED_OUTPATIENT_CLINIC_OR_DEPARTMENT_OTHER): Payer: Medicare Other

## 2018-12-02 DIAGNOSIS — Z7982 Long term (current) use of aspirin: Secondary | ICD-10-CM | POA: Insufficient documentation

## 2018-12-02 DIAGNOSIS — E039 Hypothyroidism, unspecified: Secondary | ICD-10-CM | POA: Insufficient documentation

## 2018-12-02 DIAGNOSIS — I10 Essential (primary) hypertension: Secondary | ICD-10-CM | POA: Diagnosis not present

## 2018-12-02 DIAGNOSIS — Z951 Presence of aortocoronary bypass graft: Secondary | ICD-10-CM | POA: Insufficient documentation

## 2018-12-02 DIAGNOSIS — I951 Orthostatic hypotension: Secondary | ICD-10-CM

## 2018-12-02 DIAGNOSIS — Z87891 Personal history of nicotine dependence: Secondary | ICD-10-CM | POA: Diagnosis not present

## 2018-12-02 DIAGNOSIS — I25110 Atherosclerotic heart disease of native coronary artery with unstable angina pectoris: Secondary | ICD-10-CM | POA: Diagnosis not present

## 2018-12-02 DIAGNOSIS — Z79899 Other long term (current) drug therapy: Secondary | ICD-10-CM | POA: Diagnosis not present

## 2018-12-02 DIAGNOSIS — R55 Syncope and collapse: Secondary | ICD-10-CM | POA: Diagnosis not present

## 2018-12-02 LAB — CBC WITH DIFFERENTIAL/PLATELET
Abs Immature Granulocytes: 0.14 10*3/uL — ABNORMAL HIGH (ref 0.00–0.07)
Basophils Absolute: 0 10*3/uL (ref 0.0–0.1)
Basophils Relative: 0 %
Eosinophils Absolute: 0 10*3/uL (ref 0.0–0.5)
Eosinophils Relative: 0 %
HCT: 42 % (ref 39.0–52.0)
Hemoglobin: 13.6 g/dL (ref 13.0–17.0)
Immature Granulocytes: 2 %
Lymphocytes Relative: 19 %
Lymphs Abs: 1.1 10*3/uL (ref 0.7–4.0)
MCH: 31.9 pg (ref 26.0–34.0)
MCHC: 32.4 g/dL (ref 30.0–36.0)
MCV: 98.4 fL (ref 80.0–100.0)
Monocytes Absolute: 1.5 10*3/uL — ABNORMAL HIGH (ref 0.1–1.0)
Monocytes Relative: 26 %
Neutro Abs: 3.1 10*3/uL (ref 1.7–7.7)
Neutrophils Relative %: 53 %
Platelets: 138 10*3/uL — ABNORMAL LOW (ref 150–400)
RBC: 4.27 MIL/uL (ref 4.22–5.81)
RDW: 12.9 % (ref 11.5–15.5)
WBC: 5.8 10*3/uL (ref 4.0–10.5)
nRBC: 0 % (ref 0.0–0.2)

## 2018-12-02 LAB — BASIC METABOLIC PANEL
Anion gap: 10 (ref 5–15)
BUN: 16 mg/dL (ref 8–23)
CO2: 24 mmol/L (ref 22–32)
Calcium: 9.2 mg/dL (ref 8.9–10.3)
Chloride: 102 mmol/L (ref 98–111)
Creatinine, Ser: 1.17 mg/dL (ref 0.61–1.24)
GFR calc Af Amer: >60 mL/min (ref >60)
GFR calc non Af Amer: 55 mL/min — ABNORMAL LOW (ref >60)
Glucose, Bld: 101 mg/dL — ABNORMAL HIGH (ref 70–99)
Potassium: 4.7 mmol/L (ref 3.5–5.1)
Sodium: 136 mmol/L (ref 135–145)

## 2018-12-02 MED ORDER — SODIUM CHLORIDE 0.9 % IV BOLUS (SEPSIS)
500.0000 mL | Freq: Once | INTRAVENOUS | Status: AC
  Administered 2018-12-02: 500 mL via INTRAVENOUS

## 2018-12-02 MED ORDER — SODIUM CHLORIDE 0.9 % IV SOLN: 1000.0000 mL | INTRAVENOUS | Status: DC

## 2018-12-02 NOTE — Telephone Encounter (Signed)
Wife has reached out to me this morning. Patrick [name] has had several fainting/passing out spells. She has been doing orthostatic BP - dropping 40 points. He is on NO BP medicines - these were all stopped last week. He is not eating or drinking.   He remains off thyroid replacement per PCP's recommendation. His Cymbalta however was restarted.   I have looked up Cymbalta again - orthostatic hypotension is listed - also with anorexia/weight loss. I would not take this anymore. He apparently did improve some last week while he was off.   She has been instructed to take him to the [facility] for IV fluids, probable CT of the head (he did fall and hit his head) and further evaluation.   Burtis Junes, RN, [facility] [address] [place], [place]  [address] [phone]

## 2018-12-02 NOTE — Discharge Instructions (Addendum)
Make sure you are not taking any of your blood pressure medications as previously instructed.  Stop taking the Cymbalta as well as instructed.  Make sure to drink fluids and stay well-hydrated.  Follow-up with your primary doctor and cardiologist.

## 2018-12-02 NOTE — ED Provider Notes (Signed)
[facility] EMERGENCY DEPARTMENT Provider Note   CSN: [id number] Arrival date & time: 12/02/18  1202    History   Chief Complaint Chief Complaint  Patient presents with  . Loss of Consciousness    HPI Patrick [name] is a 85 y.o. male.     HPI Patient was recently admitted to the hospital on July 2 for unstable angina.  He was discharged on July 7.  Patient has not been having any issues with chest pain since that time but he has been having difficulties with feeling weak and fatigued.  Patient states he saw his primary care doctor as his thyroid levels were abnormal while he was in the hospital.  He was told that his TSH was very low so he was taken off his thyroid medications.  Patient was still having fatigue and dyspnea on exertion when he followed up with NP Servando Snare in the cardiology office.  Patient was taken off both Toprol and losartan.  This morning patient went to go to the bathroom and he had a syncopal episode.  He does not remember what happened.  He did hit his head.  He is denying any pain now.  He is not having any headache.  He denies any fevers or chills.  No chest pain or shortness of breath.  He called the cardiology office and was instructed to come to the ED. Past Medical History:  Diagnosis Date  . BPH (benign prostatic hypertrophy)   . Diverticulosis   . ED (erectile dysfunction)   . GERD (gastroesophageal reflux disease)   . Gout   . Hepatitis    hx hepatitis after mono as teenager  . History of kidney stones   . History of skin cancer   . Hyperlipidemia   . Hypertension   . Hypothyroidism   . IHD (ischemic heart disease)    Remote CABG in 1997  . MI, acute, non ST segment elevation ([place]) 2010   s/p cath with occluded SVG to PD that fills by collaterals and the remainder of his revasculsarization is satisfactory. "the first time they did the stent , the second time they did the graft 8 vessels"  . Nocturia   . Thrombocytopenia ([place])  11/19/2018  . Urinary frequency     Patient Active Problem List   Diagnosis Date Noted  . Thrombocytopenia ([place]) 11/19/2018  . Unstable angina ([place]) 11/14/2018  . Essential (primary) hypertension 02/25/2018  . Claudication ([place]) 04/21/2016  . History of cold sores 03/22/2011  . Coronary artery disease involving native coronary artery of native heart without angina pectoris 10/28/2010  . Hyperlipemia 10/28/2010  . Hypothyroidism 10/28/2010    Past Surgical History:  Procedure Laterality Date  . APPENDECTOMY    . CARDIAC CATHETERIZATION  09/08/2008   NORMAL. EF 60%; Occluded SVG to PD that fills by collaterals and the remainder of his revascularization is satisfactory. he is managed medically.   . CORONARY ARTERY BYPASS GRAFT  1997   CABG x 8  . CORONARY STENT INTERVENTION N/A 11/18/2018   Procedure: CORONARY STENT INTERVENTION;  Surgeon: Sherren Mocha, MD;  Location: [facility];  Service: Cardiovascular;  Laterality: N/A;  . CYSTOSCOPY WITH INSERTION OF UROLIFT N/A 03/19/2015   Procedure: CYSTOSCOPY WITH INSERTION OF FOUR UROLIFTS;  Surgeon: Carolan Clines, MD;  Location: [facility];  Service: Urology;  Laterality: N/A;  . ESOPHAGOGASTRODUODENOSCOPY (EGD) WITH PROPOFOL N/A 02/12/2017   Procedure: ESOPHAGOGASTRODUODENOSCOPY (EGD) WITH PROPOFOL;  Surgeon: Otis Brace, MD;  Location: [facility];  Service: Gastroenterology;  Laterality: N/A;  . ESOPHAGOGASTRODUODENOSCOPY (EGD) WITH PROPOFOL N/A 07/13/2018   Procedure: ESOPHAGOGASTRODUODENOSCOPY (EGD) WITH PROPOFOL;  Surgeon: Clarene Essex, MD;  [facility];  Service: Endoscopy;  Laterality: N/A;  . FOREIGN BODY REMOVAL N/A 07/13/2018   Procedure: FOREIGN BODY REMOVAL;  Surgeon: Clarene Essex, MD;  [facility];  Service: Endoscopy;  Laterality: N/A;  . LEFT HEART CATH AND CORS/GRAFTS ANGIOGRAPHY N/A 11/18/2018   Procedure: LEFT HEART CATH AND CORS/GRAFTS ANGIOGRAPHY;  Surgeon: Sherren Mocha, MD;  Location: [facility];  Service: Cardiovascular;  Laterality: N/A;        Home Medications    Prior to Admission medications   Medication Sig Start Date End Date Taking? Authorizing Provider  allopurinol (ZYLOPRIM) 300 MG tablet Take 300 mg by mouth daily.    [provider]  aspirin 81 MG tablet Take 81 mg by mouth daily.      [provider]  atorvastatin (LIPITOR) 20 MG tablet Take 1 tablet (20 mg total) by mouth daily. 02/25/18 02/20/19  Daune Perch, NP  clopidogrel (PLAVIX) 75 MG tablet Take 1 tablet (75 mg total) by mouth daily with breakfast. 11/20/18   Barrett, Evelene Croon, PA-C  donepezil (ARICEPT) 10 MG tablet Take 10 mg by mouth at bedtime.  02/13/18   [provider]  nitroGLYCERIN (NITROSTAT) 0.4 MG SL tablet PLACE 1 TABLET (0.4 MG TOTAL) UNDER THE TONGUE EVERY 5 (FIVE) MINUTES AS NEEDED. 06/24/18   Nahser, Wonda Cheng, MD  tamsulosin (FLOMAX) 0.4 MG CAPS capsule Take 0.8 mg by mouth daily.     [provider]  Vitamin D, Ergocalciferol, (DRISDOL) 50000 units CAPS capsule Take 50,000 Units by mouth once a week. 07/31/17   [provider]    Family History Family History  Problem Relation Age of Onset  . Heart failure Mother 40    Social History Social History   Tobacco Use  . Smoking status: Former Smoker    Packs/day: 1.00    Years: 15.00    Pack years: 15.00    Types: Cigarettes    Quit date: 05/15/1958    Years since quitting: 60.5  . Smokeless tobacco: Never Used  Substance Use Topics  . Alcohol use: Yes    Comment: occasional beer  . Drug use: No     Allergies   Halcion [triazolam] and Pravachol   Review of Systems Review of Systems  All other systems reviewed and are negative.    Physical Exam Updated Vital Signs BP (!) 145/58   Pulse 63   Temp 97.9 F (36.6 C) (Oral)   Resp 13   Ht 1.829 m (6')   Wt 69.4 kg   SpO2 99%   BMI 20.75 kg/m   Physical Exam Vitals signs and nursing note reviewed.   Constitutional:      General: He is not in acute distress.    Appearance: He is well-developed.  HENT:     Head: Normocephalic and atraumatic.     Right Ear: External ear normal.     Left Ear: External ear normal.  Eyes:     General: No scleral icterus.       Right eye: No discharge.        Left eye: No discharge.     Conjunctiva/sclera: Conjunctivae normal.  Neck:     Musculoskeletal: Neck supple.     Trachea: No tracheal deviation.  Cardiovascular:     Rate and Rhythm: Normal rate and regular rhythm.  Pulmonary:     Effort: Pulmonary effort is normal. No respiratory distress.     Breath sounds: Normal breath sounds. No stridor. No wheezing or rales.  Abdominal:     General: Bowel sounds are normal. There is no distension.     Palpations: Abdomen is soft.     Tenderness: There is no abdominal tenderness. There is no guarding or rebound.  Musculoskeletal:        General: No tenderness.  Skin:    General: Skin is warm and dry.     Findings: No rash.  Neurological:     Mental Status: He is alert.     Cranial Nerves: No cranial nerve deficit (no facial droop, extraocular movements intact, no slurred speech).     Sensory: No sensory deficit.     Motor: No abnormal muscle tone or seizure activity.     Coordination: Coordination normal.      ED Treatments / Results  Labs (all labs ordered are listed, but only abnormal results are displayed) Labs Reviewed  CBC WITH DIFFERENTIAL/PLATELET - Abnormal; Notable for the following components:      Result Value   Platelets 138 (*)    Monocytes Absolute 1.5 (*)    Abs Immature Granulocytes 0.14 (*)    All other components within normal limits  BASIC METABOLIC PANEL - Abnormal; Notable for the following components:   Glucose, Bld 101 (*)    GFR calc non Af Amer 55 (*)    All other components within normal limits    EKG EKG Interpretation  Date/Time:  Monday December 02 2018 13:08:17 EDT Ventricular Rate:  68 PR Interval:     QRS Duration: 90 QT Interval:  411 QTC Calculation: 438 R Axis:   -81 Text Interpretation:  Sinus rhythm Consider right ventricular hypertrophy Inferior infarct, old Abnormal lateral Q waves No significant change since last tracing Confirmed by Dorie Rank [phone]) on 12/02/2018 1:55:16 PM   Radiology Ct Head Wo Contrast  Result Date: 12/02/2018 CLINICAL DATA:  Multiple syncopal episodes last night, last at 0500 hours, struck back of head, history coronary artery disease post MI and CABG, hypertension, thyroid issues, hyperlipidemia EXAM: CT HEAD WITHOUT CONTRAST TECHNIQUE: Contiguous axial images were obtained from the base of the skull through the vertex without intravenous contrast. Sagittal and coronal MPR images reconstructed from axial data set. COMPARISON:  03/19/2009 FINDINGS: Brain: Generalized atrophy. Normal ventricular morphology. No midline shift or mass effect. Small vessel chronic ischemic changes of deep cerebral white matter. Asymmetric prominence of CSF overlying the LEFT hemisphere especially in frontal region, question chronic subdural hygroma up to 11 mm thick versus asymmetric atrophy, unchanged. No intracranial hemorrhage, mass lesion, or evidence of acute infarction. Vascular: Minimal atherosclerotic calcifications of internal carotid arteries at skull base Skull: Intact Sinuses/Orbits: Clear Other: N/A IMPRESSION: Atrophy with small vessel chronic ischemic changes of deep cerebral white matter. Chronic asymmetric extra-axial CSF attenuation fluid at the LEFT greater than RIGHT frontal regions, may related to generalized atrophy or a chronic LEFT subdural hygroma. No acute intracranial abnormalities. Electronically Signed   By: Lavonia Dana M.D.   On: 12/02/2018 13:47    Procedures Procedures (including critical care time)  Medications Ordered in ED Medications  sodium chloride 0.9 % bolus 500 mL (0 mLs Intravenous Stopped 12/02/18 1437)    Followed by  0.9 %  sodium chloride  infusion (has no administration in time range)     Initial Impression / Assessment and Plan / ED Course  I have reviewed the triage vital signs and the nursing notes.  Pertinent labs & imaging results that were available during my care of the patient were reviewed by me and considered in my medical decision making (see chart for details).   Patient presented to the ED after having a syncopal episode this morning.  Patient is essentially asymptomatic here.  Denies trouble with headache, no chest pain, no shortness of breath.  He has not been having any chest pain vomiting or diarrhea.  Patient appears to be having issues with orthostatic hypotension.  This may be related to his medications.  Patient has been treated with IV fluids.  Patient and family are comfortable discharge.  Will reassess to make sure he feels steady on his feet and is not lightheaded after he receives his fluid bolus.  Final Clinical Impressions(s) / ED Diagnoses   Final diagnoses:  Orthostatic hypotension    ED Discharge Orders    None       Dorie Rank, MD 12/02/18 1529

## 2018-12-02 NOTE — ED Provider Notes (Signed)
Patient received his IV fluids.  He is feeling better and is able to ambulate without difficulty.  He has no ongoing dizziness.  No lightheadedness.  No chest pain or shortness of breath.  He was discharged per Dr. Johnsie Kindred instructions.   Patrick Johns, MD 12/02/18 [phone]

## 2018-12-02 NOTE — ED Notes (Signed)
Pt ambulated in hall w/o difficulty

## 2018-12-02 NOTE — ED Triage Notes (Signed)
Per wife pt with multiple syncopal episodes-last event 5am-pt struck back of head-recent hosp for cardiac stent and issues with thyroid (see notes)-pt NAD-steady gait

## 2018-12-06 ENCOUNTER — Telehealth: Payer: Self-pay | Admitting: *Deleted

## 2018-12-06 NOTE — Telephone Encounter (Signed)
Lvm on phone and cell for contact back for covid-19 screen. If not able to call back  Before 5 patient will be screened  On the first floor before coming up  

## 2018-12-06 NOTE — Progress Notes (Addendum)
CARDIOLOGY OFFICE NOTE  Date:  12/09/2018    Patrick [name] Date of Birth: 26-Dec-1926 Medical Record #[id number]  PCP:  Marton Redwood, MD  Cardiologist:  Servando Snare & Nahser    Chief Complaint  Patient presents with  . Follow-up    Seen for Dr. Acie Fredrickson    History of Present Illness: Patrick [name] is a 85 y.o. male who presents today for a follow up visit. Seen for Dr. Acie Fredrickson. He is a former patient of Dr. Susa Simmonds.   He has a known history of CAD with remote CABG from 54 (Dr. Redmond Pulling), HTN, HLD, hypothyroidism and GERD.   He was last seen by Dr. Acie Fredrickson in January of 2020.   His wife reached out to me just before the July 4th holiday weekend - he was having marked SOB and unstable angina - had not been doing well for several weeks - he was referred for admission. His cardiac enzymes were negative for MI and his ECG was nonacute. An echocardiogram showed preserved left ventricular function with no wall motion abnormalities, but there is calciumon both mitral and aortic valves.   He underwent cardiac cath with Dr. Burt Knack on 11/18/2018 with DES to the SVG-distal RCA. He was noted to have thrombocytopenia - with no bleeding issues. BP was elevated and his ARB was increased. He has resting bradycardia and is on beta blocker therapy - this was not changed.  His TSH was markedly low - looks like his dose was cut back. Looks like he was on 112 mcg of Synthroid and this was cut to 50 mcg.   I then saw him earlier this weekend for a work in visit - still with marked fatigue - continued DOE and was orthostatic. Most of hs medicines were stopped. Cymbalta was restarted by his PCP - he then developed recurrent symptoms of orthostasis - fell and hit his head and ended up in the ER.   The patient does not have symptoms concerning for COVID-19 infection (fever, chills, cough, or new shortness of breath).   Comes in today. Here with his wife Patrick [name]. He still has marked fatigue.  Very unsteady gait. No more falls. Clearly no improvement with despite his recent cath/PCI other than no more chest pain and not really short of breath. Seems very deconditioned.  [place] notes marked fatigue. They are getting ready to close on a new house with 16 acres. He admits his mind is telling him to do things that his body can't do. He is basically off all cardiac medicines other than DAPT and statin. Has been on Lipitor for years. Has been on Aricept for over a year. Little dizzy at times. He tells me things/activities he is doing - but he is really not according to [place]. He will be turning 89 in October. He remains OFF Cymbalta and his thyroid medicine.   Past Medical History:  Diagnosis Date  . BPH (benign prostatic hypertrophy)   . Diverticulosis   . ED (erectile dysfunction)   . GERD (gastroesophageal reflux disease)   . Gout   . Hepatitis    hx hepatitis after mono as teenager  . History of kidney stones   . History of skin cancer   . Hyperlipidemia   . Hypertension   . Hypothyroidism   . IHD (ischemic heart disease)    Remote CABG in 1997  . MI, acute, non ST segment elevation ([place]) 2010   s/p cath with occluded SVG to PD that fills  by collaterals and the remainder of his revasculsarization is satisfactory. "the first time they did the stent , the second time they did the graft 8 vessels"  . Nocturia   . Thrombocytopenia ([place]) 11/19/2018  . Urinary frequency     Past Surgical History:  Procedure Laterality Date  . APPENDECTOMY    . CARDIAC CATHETERIZATION  09/08/2008   NORMAL. EF 60%; Occluded SVG to PD that fills by collaterals and the remainder of his revascularization is satisfactory. he is managed medically.   . CORONARY ARTERY BYPASS GRAFT  1997   CABG x 8  . CORONARY STENT INTERVENTION N/A 11/18/2018   Procedure: CORONARY STENT INTERVENTION;  Surgeon: Sherren Mocha, MD;  Location: [facility];  Service: Cardiovascular;  Laterality: N/A;  . CYSTOSCOPY WITH  INSERTION OF UROLIFT N/A 03/19/2015   Procedure: CYSTOSCOPY WITH INSERTION OF FOUR UROLIFTS;  Surgeon: Carolan Clines, MD;  [facility];  Service: Urology;  Laterality: N/A;  . ESOPHAGOGASTRODUODENOSCOPY (EGD) WITH PROPOFOL N/A 02/12/2017   Procedure: ESOPHAGOGASTRODUODENOSCOPY (EGD) WITH PROPOFOL;  Surgeon: Otis Brace, MD;  [facility];  Service: Gastroenterology;  Laterality: N/A;  . ESOPHAGOGASTRODUODENOSCOPY (EGD) WITH PROPOFOL N/A 07/13/2018   Procedure: ESOPHAGOGASTRODUODENOSCOPY (EGD) WITH PROPOFOL;  Surgeon: Clarene Essex, MD;  [facility];  Service: Endoscopy;  Laterality: N/A;  . FOREIGN BODY REMOVAL N/A 07/13/2018   Procedure: FOREIGN BODY REMOVAL;  Surgeon: Clarene Essex, MD;  [facility];  Service: Endoscopy;  Laterality: N/A;  . LEFT HEART CATH AND CORS/GRAFTS ANGIOGRAPHY N/A 11/18/2018   Procedure: LEFT HEART CATH AND CORS/GRAFTS ANGIOGRAPHY;  Surgeon: Sherren Mocha, MD;  Location: [facility];  Service: Cardiovascular;  Laterality: N/A;     Medications: Current Meds  Medication Sig  . allopurinol (ZYLOPRIM) 300 MG tablet Take 300 mg by mouth daily.  Marland Kitchen aspirin 81 MG tablet Take 81 mg by mouth daily.    Marland Kitchen atorvastatin (LIPITOR) 20 MG tablet Take 1 tablet (20 mg total) by mouth daily.  . clopidogrel (PLAVIX) 75 MG tablet Take 1 tablet (75 mg total) by mouth daily with breakfast.  . donepezil (ARICEPT) 10 MG tablet Take 10 mg by mouth at bedtime.   . nitroGLYCERIN (NITROSTAT) 0.4 MG SL tablet PLACE 1 TABLET (0.4 MG TOTAL) UNDER THE TONGUE EVERY 5 (FIVE) MINUTES AS NEEDED.  Marland Kitchen tamsulosin (FLOMAX) 0.4 MG CAPS capsule Take 0.8 mg by mouth daily.   . Vitamin D, Ergocalciferol, (DRISDOL) 50000 units CAPS capsule Take 50,000 Units by mouth once a week.     Allergies: Allergies  Allergen Reactions  . Halcion [Triazolam]     "Makes me crazy"  . Pravachol     Unknown    Social History: The patient  reports that he quit smoking  about 60 years ago. His smoking use included cigarettes. He has a 15.00 pack-year smoking history. He has never used smokeless tobacco. He reports current alcohol use. He reports that he does not use drugs.   Family History: The patient's family history includes Heart failure (age of onset: 79) in his mother.   Review of Systems: Please see the history of present illness.   All other systems are reviewed and negative.   Physical Exam: VS:  BP (!) 142/62   Pulse 78   Ht 6' (1.829 m)   Wt 153 lb (69.4 kg)   SpO2 98%   BMI 20.75 kg/m  .  BMI Body mass index is 20.75 kg/m.  Wt Readings from Last 3 Encounters:  12/09/18 153 lb (69.4 kg)  12/02/18 153 lb (69.4 kg)  11/27/18 153 lb 12.8 oz (69.8 kg)    General: Alert and in no acute distress.   HEENT: Normal.  Neck: Supple, no JVD, carotid bruits, or masses noted.  Cardiac: Regular rate and rhythm. No murmurs, rubs, or gallops. No edema.  Respiratory:  Lungs are clear to auscultation bilaterally with normal work of breathing.  GI: Soft and nontender.  MS: No deformity or atrophy. Gait and ROM intact.  Skin: Warm and dry. Color is normal. Lots of bruising noted.  Neuro:  Strength and sensation are intact and no gross focal deficits noted.  Psych: Alert, appropriate and with normal affect.   LABORATORY DATA:  EKG:  EKG is not ordered today.  Lab Results  Component Value Date   WBC 5.8 12/02/2018   HGB 13.6 12/02/2018   HCT 42.0 12/02/2018   PLT 138 (L) 12/02/2018   GLUCOSE 101 (H) 12/02/2018   CHOL 91 11/15/2018   TRIG 39 11/15/2018   HDL 42 11/15/2018   LDLCALC 41 11/15/2018   ALT 14 11/27/2018   AST 12 11/27/2018   NA 136 12/02/2018   K 4.7 12/02/2018   CL 102 12/02/2018   CREATININE 1.17 12/02/2018   BUN 16 12/02/2018   CO2 24 12/02/2018   TSH 0.018 (L) 11/27/2018   INR 1.1 09/08/2008     BNP (last 3 results) Recent Labs    11/14/18 1717  BNP 82.0    ProBNP (last 3 results) No results for input(s):  PROBNP in the last 8760 hours.   Other Studies Reviewed Today:  CARDIAC CATH: 11/18/2018 1. Severe native vessel coronary artery disease with total occlusion of the RCA, total occlusion of left circumflex, and severe stenosis leading into total occlusion of the mid LAD 2. Status post multivessel coronary bypass surgery with continued patency of the saphenous vein graft RCA, sequential saphenous vein graft to OM1 and distal circumflex, and LIMA to diagonal and mid LAD 3. Severe stenosis involving the ostium/proximal portion of the SVG to distal RCA, treated successfully with a 3.5 x 20 mm Synergy DES utilizing distal embolic protection  Recommendations: Dual antiplatelet therapy with aspirin and clopidogrel 12 months as tolerated Intervention   ECHO: 11/15/2018  1. The left ventricle has normal systolic function with an ejection fraction of 60-65%. The cavity size was normal. Left ventricular diastolic Doppler parameters are consistent with impaired relaxation. Elevated left atrial and left ventricular  end-diastolic pressures The E/e' is >15. No evidence of left ventricular regional wall motion abnormalities. 2. Left atrial size was moderately dilated. 3. The mitral valve is abnormal. Mild thickening of the mitral valve leaflet. Mild calcification of the mitral valve leaflet. There is mild to moderate mitral annular calcification present. 4. The tricuspid valve is grossly normal. 5. The aortic valve is abnormal. Moderate calcification of the aortic valve. Aortic valve regurgitation is mild to moderate by color flow Doppler. No stenosis of the aortic valve.  Assessment/Plan:  1. Unstable angina - s/p cath with PCI/DES to the SVG-distal RCA - sounds like his angina is now resolved. Unfortunately, his PCI has not really improved his fatigue.   2. Pre syncope - was orthostatic - off beta blocker and off ARB and off Cymbalta.   3. Profound fatigue - on very little medicine -  has been on Aricept for over a year - may need to consider cutting dose back. Suspect this is multifactorial now - age, deconditioning, etc.  I do not think further testing is warranted at this time - need to focus on nutrition, activity level etc. Unfortunately, I think his age is playing a role and he is not really wanting to be receptive of that.   4. Markedly abnormal TSH - was very hyperthyroid - repeating lab today.   5. HTN - off medicines. BP is currently acceptable .  6. HLD - on statin  7. Memory disorder - has been on Aricept for about a year now.   8. COVID-19 Education: The signs and symptoms of COVID-19 were discussed with the patient and how to seek care for testing (follow up with PCP or arrange E-visit).  The importance of social distancing, staying at home, hand hygiene and wearing a mask when out in public were discussed today.  Current medicines are reviewed with the patient today.  The patient does not have concerns regarding medicines other than what has been noted above.  The following changes have been made:  See above.  Labs/ tests ordered today include:    Orders Placed This Encounter  Procedures  . Basic metabolic panel  . CBC  . TSH  . Sedimentation rate     Disposition:   FU with me in about 2 weeks.   Patient is agreeable to this plan and will call if any problems develop in the interim.   SignedTruitt Merle, NP  12/09/2018 3:49 PM  [facility] [address] [place], [place]  [address] Phone: [phone] Fax: [phone]      Addendum: I asked Dr. Burt Knack to review the most recent cath study -   Sherren Mocha, MD sent to Burtis Junes, NP        Bary Castilla - just reviewed. I don't see any other targets for PCI. Sorry he's not feeling better.    Wife has been updated.   Burtis Junes, RN, [facility] [address] [place],  [place]  [address] [phone]

## 2018-12-09 ENCOUNTER — Ambulatory Visit (INDEPENDENT_AMBULATORY_CARE_PROVIDER_SITE_OTHER): Payer: Medicare Other | Admitting: Nurse Practitioner

## 2018-12-09 ENCOUNTER — Encounter: Payer: Self-pay | Admitting: Nurse Practitioner

## 2018-12-09 ENCOUNTER — Other Ambulatory Visit: Payer: Self-pay

## 2018-12-09 VITALS — BP 142/62 | HR 78 | Ht 72.0 in | Wt 153.0 lb

## 2018-12-09 DIAGNOSIS — E0590 Thyrotoxicosis, unspecified without thyrotoxic crisis or storm: Secondary | ICD-10-CM | POA: Diagnosis not present

## 2018-12-09 DIAGNOSIS — E782 Mixed hyperlipidemia: Secondary | ICD-10-CM

## 2018-12-09 DIAGNOSIS — I200 Unstable angina: Secondary | ICD-10-CM | POA: Diagnosis not present

## 2018-12-09 DIAGNOSIS — I2510 Atherosclerotic heart disease of native coronary artery without angina pectoris: Secondary | ICD-10-CM | POA: Diagnosis not present

## 2018-12-09 DIAGNOSIS — R5383 Other fatigue: Secondary | ICD-10-CM

## 2018-12-09 DIAGNOSIS — I10 Essential (primary) hypertension: Secondary | ICD-10-CM

## 2018-12-09 DIAGNOSIS — I951 Orthostatic hypotension: Secondary | ICD-10-CM

## 2018-12-09 NOTE — Patient Instructions (Addendum)
After Visit Summary:  We will be checking the following labs today - BMET, CBC, TSh and sed rate   Medication Instructions:    Continue with your current medicines.    If you need a refill on your cardiac medications before your next appointment, please call your pharmacy.     Testing/Procedures To Be Arranged:  N/A  Follow-Up:   See me in 2 weeks.     At [facility], you and your health needs are our priority.  As part of our continuing mission to provide you with exceptional heart care, we have created designated Provider Care Teams.  These Care Teams include your primary Cardiologist (physician) and Advanced Practice Providers (APPs -  Physician Assistants and Nurse Practitioners) who all work together to provide you with the care you need, when you need it.  Special Instructions:  . Stay safe, stay home, wash your hands for at least 20 seconds and wear a mask when out in public.  . It was good to talk with you today.    Call the [facility] office at [phone] if you have any questions, problems or concerns.

## 2018-12-10 LAB — BASIC METABOLIC PANEL
BUN/Creatinine Ratio: 14 (ref 10–24)
BUN: 16 mg/dL (ref 8–27)
CO2: 23 mmol/L (ref 20–29)
Calcium: 9 mg/dL (ref 8.6–10.2)
Chloride: 102 mmol/L (ref 96–106)
Creatinine, Ser: 1.12 mg/dL (ref 0.76–1.27)
GFR calc Af Amer: 67 mL/min/{1.73_m2} (ref >59)
GFR calc non Af Amer: 58 mL/min/{1.73_m2} — ABNORMAL LOW (ref >59)
Glucose: 92 mg/dL (ref 65–99)
Potassium: 4.5 mmol/L (ref 3.5–5.2)
Sodium: 140 mmol/L (ref 134–144)

## 2018-12-10 LAB — TSH: TSH: 1.02 u[IU]/mL (ref 0.450–4.500)

## 2018-12-10 LAB — CBC
Hematocrit: 40.1 % (ref 37.5–51.0)
Hemoglobin: 14 g/dL (ref 13.0–17.7)
MCH: 32.3 pg (ref 26.6–33.0)
MCHC: 34.9 g/dL (ref 31.5–35.7)
MCV: 92 fL (ref 79–97)
Platelets: 131 10*3/uL — ABNORMAL LOW (ref 150–450)
RBC: 4.34 x10E6/uL (ref 4.14–5.80)
RDW: 12.7 % (ref 11.6–15.4)
WBC: 5.3 10*3/uL (ref 3.4–10.8)

## 2018-12-10 LAB — SEDIMENTATION RATE: Sed Rate: 3 mm/hr (ref 0–30)

## 2018-12-12 ENCOUNTER — Other Ambulatory Visit: Payer: Self-pay | Admitting: Physician Assistant

## 2018-12-18 ENCOUNTER — Telehealth: Payer: Self-pay | Admitting: Nurse Practitioner

## 2018-12-18 MED ORDER — FLUDROCORTISONE ACETATE 0.1 MG PO TABS: 0.1000 mg | ORAL_TABLET | Freq: Every day | ORAL | 3 refills | Status: DC

## 2018-12-18 NOTE — Telephone Encounter (Signed)
[name] has reached out to me - Fraser Din is still having considerable issues with standing up and having marked dizziness. He is eating better and trying to move more but quite limited due to the dizziness and a feeling of passing out.   She was able to check orthostatics on him at home:  Lying BP was 151/72 with HR of 72 Sitting BP was 125/57 with HR of 76 Standing BP 100/47 with HR 86 and very wobbly/swaying and dizzy.   He has liberalized his salt.   I have advised her to get knee high support stockings and wear during the day - off at night.  We will start Florinef 0.1mg  daily. She is to monitor the BP and understands that resting BP will most likely be higher.   He has an appointment with me on Tuesday next week.   Burtis Junes, RN, [place] [address] [place], [place]  [address] [phone]

## 2018-12-22 NOTE — Progress Notes (Deleted)
CARDIOLOGY OFFICE NOTE  Date:  12/23/2018    Patrick Rica H Eggert Date of Birth: Mar 13, 1927 Medical Record #[id number]  PCP:  Marton Redwood, MD  Cardiologist:  [facility]    No chief complaint on file.   History of Present Illness: Patrick [name] is a 85 y.o. male who presents today for a follow up visit.  Seen for Dr. Acie Fredrickson. He is a former patient of Dr. Susa Simmonds.   He has a known history of CAD with remote CABG from 68 (Dr. Redmond Pulling), HTN, HLD, hypothyroidism and GERD.   He was last seen by Dr. Acie Fredrickson in [place] 2020.   His wife reached out to me just before the July 4th holiday weekend - he was having marked SOB and unstable angina -had not been doing well for several weeks -he was referred for admission.His cardiac enzymes were negative for MI and his ECG was nonacute. An echocardiogram showed preserved left ventricular function with no wall motion abnormalities, butthere iscalciumon both mitral and aortic valves.   He underwent cardiac cath with Dr. Burt Knack on7/10/2018 withDES to the SVG-distal RCA. He was noted to have thrombocytopenia -withno bleeding issues. BP was elevated and his ARB was increased. He has resting bradycardia and is on beta blocker therapy- this was not changed.  His TSH was markedly low -looks like his dose was cut back. Looks like he was on 112 mcg of Synthroid and this was cut to 50 mcg.  I saw him twice last month - he has not done well - marked orthostasis - have ended up starting Florinef. 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   The patient {does/does not:200015} have symptoms concerning for COVID-19 infection (fever, chills, cough, or new shortness of breath).   Comes in today. Here with   Past Medical History:  Diagnosis Date  . BPH (benign prostatic hypertrophy)   . Diverticulosis   . ED (erectile dysfunction)   . GERD (gastroesophageal  reflux disease)   . Gout   . Hepatitis    hx hepatitis after mono as teenager  . History of kidney stones   . History of skin cancer   . Hyperlipidemia   . Hypertension   . Hypothyroidism   . IHD (ischemic heart disease)    Remote CABG in 1997  . MI, acute, non ST segment elevation ([place]) 2010   s/p cath with occluded SVG to PD that fills by collaterals and the remainder of his revasculsarization is satisfactory. "the first time they did the stent , the second time they did the graft 8 vessels"  . Nocturia   . Thrombocytopenia ([place]) 11/19/2018  . Urinary frequency     Past Surgical History:  Procedure Laterality Date  . APPENDECTOMY    . CARDIAC CATHETERIZATION  09/08/2008   NORMAL. EF 60%; Occluded SVG to PD that fills by collaterals and the remainder of his revascularization is satisfactory. he is managed medically.   . CORONARY ARTERY BYPASS GRAFT  1997   CABG x 8  . CORONARY STENT INTERVENTION N/A 11/18/2018   Procedure: CORONARY STENT INTERVENTION;  Surgeon: Sherren Mocha, MD;  Location: [facility];  Service: Cardiovascular;  Laterality: N/A;  . CYSTOSCOPY WITH INSERTION OF UROLIFT N/A 03/19/2015   Procedure: CYSTOSCOPY WITH INSERTION OF FOUR UROLIFTS;  Surgeon: Carolan Clines, MD;  [facility];  Service: Urology;  Laterality: N/A;  . ESOPHAGOGASTRODUODENOSCOPY (  EGD) WITH PROPOFOL N/A 02/12/2017   Procedure: ESOPHAGOGASTRODUODENOSCOPY (EGD) WITH PROPOFOL;  Surgeon: Otis Brace, MD;  [facility];  Service: Gastroenterology;  Laterality: N/A;  . ESOPHAGOGASTRODUODENOSCOPY (EGD) WITH PROPOFOL N/A 07/13/2018   Procedure: ESOPHAGOGASTRODUODENOSCOPY (EGD) WITH PROPOFOL;  Surgeon: Clarene Essex, MD;  [facility];  Service: Endoscopy;  Laterality: N/A;  . FOREIGN BODY REMOVAL N/A 07/13/2018   Procedure: FOREIGN BODY REMOVAL;  Surgeon: Clarene Essex, MD;  [facility];  Service: Endoscopy;  Laterality: N/A;  . LEFT HEART CATH AND CORS/GRAFTS  ANGIOGRAPHY N/A 11/18/2018   Procedure: LEFT HEART CATH AND CORS/GRAFTS ANGIOGRAPHY;  Surgeon: Sherren Mocha, MD;  Location: [facility];  Service: Cardiovascular;  Laterality: N/A;     Medications: No outpatient medications have been marked as taking for the 12/24/18 encounter (Appointment) with Burtis Junes, NP.     Allergies: Allergies  Allergen Reactions  . Halcion [Triazolam]     "Makes me crazy"  . Pravachol     Unknown    Social History: The patient  reports that he quit smoking about 60 years ago. His smoking use included cigarettes. He has a 15.00 pack-year smoking history. He has never used smokeless tobacco. He reports current alcohol use. He reports that he does not use drugs.   Family History: The patient's ***family history includes Heart failure (age of onset: 79) in his mother.   Review of Systems: Please see the history of present illness.   All other systems are reviewed and negative.   Physical Exam: VS:  There were no vitals taken for this visit. Marland Kitchen  BMI There is no height or weight on file to calculate BMI.  Wt Readings from Last 3 Encounters:  12/09/18 153 lb (69.4 kg)  12/02/18 153 lb (69.4 kg)  11/27/18 153 lb 12.8 oz (69.8 kg)    General: Pleasant. Well developed, well nourished and in no acute distress.   HEENT: Normal.  Neck: Supple, no JVD, carotid bruits, or masses noted.  Cardiac: ***Regular rate and rhythm. No murmurs, rubs, or gallops. No edema.  Respiratory:  Lungs are clear to auscultation bilaterally with normal work of breathing.  GI: Soft and nontender.  MS: No deformity or atrophy. Gait and ROM intact.  Skin: Warm and dry. Color is normal.  Neuro:  Strength and sensation are intact and no gross focal deficits noted.  Psych: Alert, appropriate and with normal affect.   LABORATORY DATA:  EKG:  EKG {ACTION; IS/IS JSE:[id number]} ordered today. This demonstrates ***.  Lab Results  Component Value Date   WBC 5.3 12/09/2018    HGB 14.0 12/09/2018   HCT 40.1 12/09/2018   PLT 131 (L) 12/09/2018   GLUCOSE 92 12/09/2018   CHOL 91 11/15/2018   TRIG 39 11/15/2018   HDL 42 11/15/2018   LDLCALC 41 11/15/2018   ALT 14 11/27/2018   AST 12 11/27/2018   NA 140 12/09/2018   K 4.5 12/09/2018   CL 102 12/09/2018   CREATININE 1.12 12/09/2018   BUN 16 12/09/2018   CO2 23 12/09/2018   TSH 1.020 12/09/2018   INR 1.1 09/08/2008     BNP (last 3 results) Recent Labs    11/14/18 1717  BNP 82.0    ProBNP (last 3 results) No results for input(s): PROBNP in the last 8760 hours.   Other Studies Reviewed Today:  CARDIAC CATH: 11/18/2018 1. Severe native vessel coronary artery disease with total occlusion of the RCA, total occlusion of left circumflex, and severe  stenosis leading into total occlusion of the mid LAD 2. Status post multivessel coronary bypass surgery with continued patency of the saphenous vein graft RCA, sequential saphenous vein graft to OM1 and distal circumflex, and LIMA to diagonal and mid LAD 3. Severe stenosis involving the ostium/proximal portion of the SVG to distal RCA, treated successfully with a 3.5 x 20 mm Synergy DES utilizing distal embolic protection  Recommendations: Dual antiplatelet therapy with aspirin and clopidogrel 12 months as tolerated Intervention   ECHO: 11/15/2018  1. The left ventricle has normal systolic function with an ejection fraction of 60-65%. The cavity size was normal. Left ventricular diastolic Doppler parameters are consistent with impaired relaxation. Elevated left atrial and left ventricular  end-diastolic pressures The E/e' is >15. No evidence of left ventricular regional wall motion abnormalities. 2. Left atrial size was moderately dilated. 3. The mitral valve is abnormal. Mild thickening of the mitral valve leaflet. Mild calcification of the mitral valve leaflet. There is mild to moderate mitral annular calcification present. 4. The tricuspid  valve is grossly normal. 5. The aortic valve is abnormal. Moderate calcification of the aortic valve. Aortic valve regurgitation is mild to moderate by color flow Doppler. No stenosis of the aortic valve.  Assessment/Plan:  1. Unstable angina - s/p cath with PCI/DES to theSVG-distal RCA- sounds like his angina is now resolved.Unfortunately, his PCI has not really improved his fatigue.   2. Orthostatic hypotension -    3. Profound fatigue- on very little medicine - has been on Aricept for over a year - may need to consider cutting dose back. Suspect this is multifactorial now - age, deconditioning, etc.  I do not think further testing is warranted at this time - need to focus on nutrition, activity level etc. Unfortunately, I think his age is playing a role and he is not really wanting to be receptive of that.   4.Markedly abnormal TSH - was very hyperthyroid -repeating lab today.   5.HTN- off medicines. BP is currently acceptable .  6. HLD- on statin  7. Memory disorder- has been on Aricept for about a year now.   8. COVID-19 Education: The signs and symptoms of COVID-19 were discussed with the patient and how to seek care for testing (follow up with PCP or arrange E-visit).  The importance of social distancing, staying at home, hand hygiene and wearing a mask when out in public were discussed today.  Current medicines are reviewed with the patient today.  The patient does not have concerns regarding medicines other than what has been noted above.  The following changes have been made:  See above.  Labs/ tests ordered today include:   No orders of the defined types were placed in this encounter.    Disposition:   FU with *** in {gen number 4-09:735329} {Days to years:10300}.   Patient is agreeable to this plan and will call if any problems develop in the interim.   SignedTruitt Merle, NP  12/23/2018 1:01 PM  [facility] [address] [place], [place]  [address] Phone: [phone] Fax: [phone]

## 2018-12-23 DIAGNOSIS — R634 Abnormal weight loss: Secondary | ICD-10-CM | POA: Diagnosis not present

## 2018-12-23 DIAGNOSIS — E0590 Thyrotoxicosis, unspecified without thyrotoxic crisis or storm: Secondary | ICD-10-CM | POA: Diagnosis not present

## 2018-12-23 DIAGNOSIS — G609 Hereditary and idiopathic neuropathy, unspecified: Secondary | ICD-10-CM | POA: Diagnosis not present

## 2018-12-23 DIAGNOSIS — Z9861 Coronary angioplasty status: Secondary | ICD-10-CM | POA: Diagnosis not present

## 2018-12-24 ENCOUNTER — Ambulatory Visit: Payer: Medicare Other | Admitting: Nurse Practitioner

## 2018-12-24 DIAGNOSIS — I10 Essential (primary) hypertension: Secondary | ICD-10-CM | POA: Diagnosis not present

## 2018-12-24 DIAGNOSIS — E0590 Thyrotoxicosis, unspecified without thyrotoxic crisis or storm: Secondary | ICD-10-CM | POA: Diagnosis not present

## 2018-12-24 DIAGNOSIS — E538 Deficiency of other specified B group vitamins: Secondary | ICD-10-CM | POA: Diagnosis not present

## 2018-12-24 DIAGNOSIS — R634 Abnormal weight loss: Secondary | ICD-10-CM | POA: Diagnosis not present

## 2019-01-01 DIAGNOSIS — R5383 Other fatigue: Secondary | ICD-10-CM | POA: Diagnosis not present

## 2019-01-01 DIAGNOSIS — F329 Major depressive disorder, single episode, unspecified: Secondary | ICD-10-CM | POA: Diagnosis not present

## 2019-01-01 DIAGNOSIS — R42 Dizziness and giddiness: Secondary | ICD-10-CM | POA: Diagnosis not present

## 2019-01-01 DIAGNOSIS — E039 Hypothyroidism, unspecified: Secondary | ICD-10-CM | POA: Diagnosis not present

## 2019-01-01 DIAGNOSIS — E46 Unspecified protein-calorie malnutrition: Secondary | ICD-10-CM | POA: Diagnosis not present

## 2019-01-02 DIAGNOSIS — Z85828 Personal history of other malignant neoplasm of skin: Secondary | ICD-10-CM | POA: Diagnosis not present

## 2019-01-02 DIAGNOSIS — C4442 Squamous cell carcinoma of skin of scalp and neck: Secondary | ICD-10-CM | POA: Diagnosis not present

## 2019-01-04 NOTE — Progress Notes (Signed)
Patrick [name] Date of Birth  [birth year] [place] [address]    [address]     [place], [place]  [address]         Problems: 1. Coronary artery disease- status post CABG 1997.  2. Hyperlipidemia 3. Hypothyroidism     Patrick [name] is an 85 yo with the above noted hx.  He exercises every day ( walks for an hour a day).  He complains of fatigue when doing yard work.  He's not had any cardiac complaints.  August 30, 2012:  He still complains of generalized fatigue.   He has been having fatigued for the past several office visits.  He is really frustrated with his fatigue.  Questions whether or not it is due to his medications.  Oct. 23, 2014:  Patrick [name] is doing well.  Exercising regularly - energy is ok.    August 27, 2013:  Patrick [name] is doing ok.  He walks occasionally but not regularly.  He knows that he needs to get back into it.   Fatigues easily.    Aug. 30, 2016: Patrick [name] is doing well. No CP  Has had some night sweats - has  occurred several time Does his yard work without problems   Sept. 1, 2017: Doing well from a cardiac standpoint Has more fatigued than he would like He has had an episode of orthostatic hypotension.  Has developed a peripheral neuropathy in his toes. Was started on Vit B12 .   Dec. 8, 2017:  Patrick [name] was seen with wife, Patrick [name].   More fatigue recently  Does not exercise anymore.    Aug. 16. 2018  Patrick [name] is doing well  No CP or dyspnea.  Had an episode of generalized weakness after incercourse on one occasion  October 25, 2017:  Has developed peripheraph neuropathy. No angina .  No dyspnea.   Still very active.   May 28, 2018:  Still very active.    Discussed his ED issues.  Offered to give a script for sildenifil  No chest tightness.  He does have some shortness of breath when he is out walking around his yard.  [place] and gets short of breath when he is walking from the [place] up to  his [place].  Aug. 24, 2020   Seen with Patrick [name] today  Patrick [name] developed severe shortness of breath with exertion in July.  He contacted Truitt Merle,  NP.  Heart catheterization on November 18, 2018 revealed significant disease in the saphenous vein graft to the distal RCA.  He had drug-eluting stenting of the saphenous vein graft.    I have personally reviewed cath films.   He has severe native CAD  PCI to the SVG to RCA .    His TSH was very low.  His Synthroid has been cut back.  He continues to have marked fatigue.  Current Outpatient Medications on File Prior to Visit  Medication Sig Dispense Refill  . allopurinol (ZYLOPRIM) 300 MG tablet Take 300 mg by mouth daily.    Marland Kitchen aspirin 81 MG tablet Take 81 mg by mouth daily.      Marland Kitchen atorvastatin (LIPITOR) 20 MG tablet Take 1 tablet (20 mg total) by mouth daily. 90 tablet 3  . clopidogrel (PLAVIX) 75 MG tablet Take 1 tablet (75 mg total) by mouth daily with breakfast. 30 tablet 11  . donepezil (ARICEPT) 10 MG tablet Take 10  mg by mouth at bedtime.   0  . fludrocortisone (FLORINEF) 0.1 MG tablet Take 1 tablet (0.1 mg total) by mouth daily. 30 tablet 3  . nitroGLYCERIN (NITROSTAT) 0.4 MG SL tablet PLACE 1 TABLET (0.4 MG TOTAL) UNDER THE TONGUE EVERY 5 (FIVE) MINUTES AS NEEDED. 75 tablet 2  . tamsulosin (FLOMAX) 0.4 MG CAPS capsule Take 0.8 mg by mouth daily.     . Vitamin D, Ergocalciferol, (DRISDOL) 50000 units CAPS capsule Take 50,000 Units by mouth once a week.  3   No current facility-administered medications on file prior to visit.     Allergies  Allergen Reactions  . Halcion [Triazolam]     "Makes me crazy"  . Pravachol     Unknown    Past Medical History:  Diagnosis Date  . BPH (benign prostatic hypertrophy)   . Diverticulosis   . ED (erectile dysfunction)   . GERD (gastroesophageal reflux disease)   . Gout   . Hepatitis    hx hepatitis after mono as teenager  . History of kidney stones   . History of skin cancer   .  Hyperlipidemia   . Hypertension   . Hypothyroidism   . IHD (ischemic heart disease)    Remote CABG in 1997  . MI, acute, non ST segment elevation ([place]) 2010   s/p cath with occluded SVG to PD that fills by collaterals and the remainder of his revasculsarization is satisfactory. "the first time they did the stent , the second time they did the graft 8 vessels"  . Nocturia   . Thrombocytopenia ([place]) 11/19/2018  . Urinary frequency     Past Surgical History:  Procedure Laterality Date  . APPENDECTOMY    . CARDIAC CATHETERIZATION  09/08/2008   NORMAL. EF 60%; Occluded SVG to PD that fills by collaterals and the remainder of his revascularization is satisfactory. he is managed medically.   . CORONARY ARTERY BYPASS GRAFT  1997   CABG x 8  . CORONARY STENT INTERVENTION N/A 11/18/2018   Procedure: CORONARY STENT INTERVENTION;  Surgeon: Sherren Mocha, MD;  Location: [facility];  Service: Cardiovascular;  Laterality: N/A;  . CYSTOSCOPY WITH INSERTION OF UROLIFT N/A 03/19/2015   Procedure: CYSTOSCOPY WITH INSERTION OF FOUR UROLIFTS;  Surgeon: Carolan Clines, MD;  [facility];  Service: Urology;  Laterality: N/A;  . ESOPHAGOGASTRODUODENOSCOPY (EGD) WITH PROPOFOL N/A 02/12/2017   Procedure: ESOPHAGOGASTRODUODENOSCOPY (EGD) WITH PROPOFOL;  Surgeon: Otis Brace, MD;  [facility];  Service: Gastroenterology;  Laterality: N/A;  . ESOPHAGOGASTRODUODENOSCOPY (EGD) WITH PROPOFOL N/A 07/13/2018   Procedure: ESOPHAGOGASTRODUODENOSCOPY (EGD) WITH PROPOFOL;  Surgeon: Clarene Essex, MD;  [facility];  Service: Endoscopy;  Laterality: N/A;  . FOREIGN BODY REMOVAL N/A 07/13/2018   Procedure: FOREIGN BODY REMOVAL;  Surgeon: Clarene Essex, MD;  [facility];  Service: Endoscopy;  Laterality: N/A;  . LEFT HEART CATH AND CORS/GRAFTS ANGIOGRAPHY N/A 11/18/2018   Procedure: LEFT HEART CATH AND CORS/GRAFTS ANGIOGRAPHY;  Surgeon: Sherren Mocha, MD;  Location: [facility];  Service: Cardiovascular;  Laterality: N/A;    Social History   Tobacco Use  Smoking Status Former Smoker  . Packs/day: 1.00  . Years: 15.00  . Pack years: 15.00  . Types: Cigarettes  . Quit date: 05/15/1958  . Years since quitting: 60.6  Smokeless Tobacco Never Used    Social History   Substance and Sexual Activity  Alcohol Use Yes   Comment: occasional beer    Family History  Problem Relation Age of Onset  . Heart failure Mother 61    Reviw of Systems:  Reviewed in the HPI.  All other systems are negative.   Physical Exam: There were no vitals taken for this visit.  GEN:  Elderly male,  Appears younger than his age.  HEENT: Normal NECK: No JVD; No carotid bruits LYMPHATICS: No lymphadenopathy CARDIAC: RRR , no murmurs, rubs, gallops RESPIRATORY:  Clear to auscultation without rales, wheezing or rhonchi  ABDOMEN: Soft, non-tender, non-distended MUSCULOSKELETAL:  No edema; No deformity  SKIN: Warm and dry NEUROLOGIC:  Alert and oriented x 3   ECG:    Assessment / Plan:   1. Coronary artery disease-   I have personally reviewed Pat's cath films.  He has severe diffuse coronary artery disease.  He is status post PCI of the saphenous vein graft to the right coronary artery.  Unfortunately this did not help any of his symptoms.  At this point I suspect that a lot of his fatigue is due to his severe diffuse coronary artery disease.  We will start him on Ranexa 500 mg twice a day. He will return in 1 month for a nurse visit, blood pressure and EKG.  Anticipate increasing his Ranexa to 1000 mg twice a day at that time as long as his QT interval is not prolonged.  I will see him again in 2 to 3 months for follow-up visit.  We will anticipate adding isosorbide at that point.    2. Hyperlipidemia-last lipid levels look okay.  His LDL is 41.  HDL is 42.  Total cholesterol is 91.  3.  Leg pain/peripheral neuropathy:.   4.  Essential HTN:      BP is well  controlled.   5.  Hyperthyroidism: due to overreplacement .   His TSH was very low.  Is being corrected.     Mertie Moores, MD  01/04/2019 1:21 PM    [facility] [address]  [place], [place]  [address] Pager [phone] Phone: [phone]; Fax: [phone]

## 2019-01-07 ENCOUNTER — Other Ambulatory Visit: Payer: Self-pay

## 2019-01-07 ENCOUNTER — Encounter: Payer: Self-pay | Admitting: Cardiovascular Disease

## 2019-01-07 ENCOUNTER — Ambulatory Visit (INDEPENDENT_AMBULATORY_CARE_PROVIDER_SITE_OTHER): Payer: Medicare Other | Admitting: Cardiovascular Disease

## 2019-01-07 VITALS — BP 130/80 | HR 71 | Ht 72.0 in | Wt 155.6 lb

## 2019-01-07 DIAGNOSIS — E782 Mixed hyperlipidemia: Secondary | ICD-10-CM | POA: Diagnosis not present

## 2019-01-07 DIAGNOSIS — I200 Unstable angina: Secondary | ICD-10-CM

## 2019-01-07 DIAGNOSIS — I2510 Atherosclerotic heart disease of native coronary artery without angina pectoris: Secondary | ICD-10-CM | POA: Diagnosis not present

## 2019-01-07 MED ORDER — RANOLAZINE ER 500 MG PO TB12: 500.0000 mg | ORAL_TABLET | Freq: Two times a day (BID) | ORAL | 1 refills | Status: DC

## 2019-01-07 NOTE — Patient Instructions (Signed)
Medication Instructions:  1) START RANEXA 500 mg twice daily   Follow-Up: You are scheduled for an EKG on 02/05/2019 at 2:00PM.  You are scheduled for a visit with Dr. Acie Fredrickson on 03/19/2019 at 11:20AM.

## 2019-01-15 DIAGNOSIS — G609 Hereditary and idiopathic neuropathy, unspecified: Secondary | ICD-10-CM | POA: Diagnosis not present

## 2019-01-15 DIAGNOSIS — E46 Unspecified protein-calorie malnutrition: Secondary | ICD-10-CM | POA: Diagnosis not present

## 2019-01-15 DIAGNOSIS — E039 Hypothyroidism, unspecified: Secondary | ICD-10-CM | POA: Diagnosis not present

## 2019-01-15 DIAGNOSIS — R269 Unspecified abnormalities of gait and mobility: Secondary | ICD-10-CM | POA: Diagnosis not present

## 2019-01-15 DIAGNOSIS — Z9861 Coronary angioplasty status: Secondary | ICD-10-CM | POA: Diagnosis not present

## 2019-01-15 DIAGNOSIS — E538 Deficiency of other specified B group vitamins: Secondary | ICD-10-CM | POA: Diagnosis not present

## 2019-01-15 DIAGNOSIS — Z23 Encounter for immunization: Secondary | ICD-10-CM | POA: Diagnosis not present

## 2019-01-15 DIAGNOSIS — H02401 Unspecified ptosis of right eyelid: Secondary | ICD-10-CM | POA: Diagnosis not present

## 2019-01-24 NOTE — Progress Notes (Signed)
CARDIOLOGY OFFICE NOTE  Date:  01/29/2019    Patrick [name] Date of Birth: [birth year] Medical Record [id number]  PCP:  Patrick Redwood, MD  Cardiologist:  Patrick [name] & Patrick [name]   Chief Complaint  Patient presents with  . Follow-up    History of Present Illness: Patrick [name] is a 85 y.o. male who presents today for a work in visit. Seen for Patrick [name]. He is a former patient of Dr. Susa [name].   He has a known history of CAD with remote CABG from 42 (Dr. Redmond [name]), HTN, HLD, hypothyroidism and GERD.   He was last seen by Patrick [name] in [place] 2020.   His wife reached out to me just before the July 4th holiday weekend - he was having marked SOB and unstable angina -had not been doing well for several weeks -he was referred for admission.His cardiac enzymes were negative for MI and his ECG was nonacute. An echocardiogram showed preserved left ventricular function with no wall motion abnormalities, butthere iscalciumon both mitral and aortic valves.   He underwent cardiac cath with Dr. Burt [name] on7/10/2018 withDES to the SVG-distal RCA. He was noted to have thrombocytopenia -withno bleeding issues. BP was elevated and his ARB was increased. He has resting bradycardia and is on beta blocker therapy- this was not changed.  His TSH was markedly low -he has had his dose of Patrick [name] reduced, then stopped and now back on low dose.   I have seen him as a work in a few times - he has developed marked orthostasis - not clear to me as to why - he is off most of his medicines - was placed on Patrick [name] with some improvement. He saw Patrick [name] just a few weeks ago - cath films again reviewed - lots of native disease - trying to manage medically - he has been placed on Patrick [name].  His wife reached out to me again last week - actually sent me pictures - he looked like he had fallen - had a left black eye - lots of bruising on his arms and legs - sleeping more - overall decline  noted and they were quite concerned. He is quite scared to die. They have put their house on the market and have bought a new home in [facility].   The patient does not have symptoms concerning for COVID-19 infection (fever, chills, cough, or new shortness of breath).   Comes in today. Here with his wife. They have just found out that he is blind in his right eye - has a detached retina - apparently happened sometime in the past - this is not new. Apparently not anything to do that will help his vision - now legally blind - no longer able to drive. His balance is poor. The vision issue is probably impacting this as well. He did admit to having a fall - 2 weeks ago - still with lots of bruising on his arms and legs. His black eye looks better. He is not really able to give details about this fall. Patrick [name] wishes he would use a walker - he is resistant to this. No chest pain. This profound fatigue continues. BP is ok. Does not seem like the Patrick [name] has helped but willing to stay on. Back on some thyroid medicine - due for labs and physical with Dr. Brigitte [name] in early October. He is very tearful about his situation - wishes he was able to do more and upset that he can't.  Past Medical History:  Diagnosis Date  . BPH (benign prostatic hypertrophy)   . Diverticulosis   . ED (erectile dysfunction)   . GERD (gastroesophageal reflux disease)   . Gout   . Hepatitis    hx hepatitis after mono as teenager  . History of kidney stones   . History of skin cancer   . Hyperlipidemia   . Hypertension   . Hypothyroidism   . IHD (ischemic heart disease)    Remote CABG in 1997  . MI, acute, non ST segment elevation ([place]) 2010   s/p cath with occluded SVG to PD that fills by collaterals and the remainder of his revasculsarization is satisfactory. "the first time they did the stent , the second time they did the graft 8 vessels"  . Nocturia   . Thrombocytopenia ([place]) 11/19/2018  . Urinary frequency     Past Surgical  History:  Procedure Laterality Date  . APPENDECTOMY    . CARDIAC CATHETERIZATION  09/08/2008   NORMAL. EF 60%; Occluded SVG to PD that fills by collaterals and the remainder of his revascularization is satisfactory. he is managed medically.   . CORONARY ARTERY BYPASS GRAFT  1997   CABG x 8  . CORONARY STENT INTERVENTION N/A 11/18/2018   Procedure: CORONARY STENT INTERVENTION;  Surgeon: Patrick Mocha, MD;  Location: [facility];  Service: Cardiovascular;  Laterality: N/A;  . CYSTOSCOPY WITH INSERTION OF UROLIFT N/A 03/19/2015   Procedure: CYSTOSCOPY WITH INSERTION OF FOUR UROLIFTS;  Surgeon: Patrick Clines, MD;  Location: [facility];  Service: Urology;  Laterality: N/A;  . ESOPHAGOGASTRODUODENOSCOPY (EGD) WITH PROPOFOL N/A 02/12/2017   Procedure: ESOPHAGOGASTRODUODENOSCOPY (EGD) WITH PROPOFOL;  Surgeon: Patrick Brace, MD;  Location: [facility];  Service: Gastroenterology;  Laterality: N/A;  . ESOPHAGOGASTRODUODENOSCOPY (EGD) WITH PROPOFOL N/A 07/13/2018   Procedure: ESOPHAGOGASTRODUODENOSCOPY (EGD) WITH PROPOFOL;  Surgeon: Patrick Essex, MD;  Location: [facility];  Service: Endoscopy;  Laterality: N/A;  . FOREIGN BODY REMOVAL N/A 07/13/2018   Procedure: FOREIGN BODY REMOVAL;  Surgeon: Patrick Essex, MD;  Location: [facility];  Service: Endoscopy;  Laterality: N/A;  . LEFT HEART CATH AND CORS/GRAFTS ANGIOGRAPHY N/A 11/18/2018   Procedure: LEFT HEART CATH AND CORS/GRAFTS ANGIOGRAPHY;  Surgeon: Patrick Mocha, MD;  Location: [facility];  Service: Cardiovascular;  Laterality: N/A;     Medications: Current Meds  Medication Sig  . allopurinol (ZYLOPRIM) 300 MG tablet Take 300 mg by mouth daily.  Marland Kitchen aspirin 81 MG tablet Take 81 mg by mouth daily.    Marland Kitchen atorvastatin (LIPITOR) 20 MG tablet Take 1 tablet (20 mg total) by mouth daily.  . clopidogrel (PLAVIX) 75 MG tablet Take 1 tablet (75 mg total) by mouth daily with breakfast.  . donepezil (ARICEPT) 10 MG tablet Take 10 mg by mouth at  bedtime.   . fludrocortisone (Patrick [name]) 0.1 MG tablet Take 1 tablet (0.1 mg total) by mouth daily.  Marland Kitchen levothyroxine (Patrick [name]) 50 MCG tablet Take 50 mcg by mouth daily before breakfast.  . nitroGLYCERIN (NITROSTAT) 0.4 MG SL tablet PLACE 1 TABLET (0.4 MG TOTAL) UNDER THE TONGUE EVERY 5 (FIVE) MINUTES AS NEEDED.  Marland Kitchen tamsulosin (FLOMAX) 0.4 MG CAPS capsule Take 0.8 mg by mouth daily.   . Vitamin D, Ergocalciferol, (DRISDOL) 50000 units CAPS capsule Take 50,000 Units by mouth once a week.  . [DISCONTINUED] ranolazine (Patrick [name]) 500 MG 12 hr tablet Take 1 tablet (500 mg total) by mouth 2 (two) times daily.     Allergies: Allergies  Allergen Reactions  .  Halcion [Triazolam]     "Makes me crazy"  . Pravachol     Unknown    Social History: The patient  reports that he quit smoking about 60 years ago. His smoking use included cigarettes. He has a 15.00 pack-year smoking history. He has never used smokeless tobacco. He reports current alcohol use. He reports that he does not use drugs.   Family History: The patient's family history includes Heart failure (age of onset: 9) in his mother.   Review of Systems: Please see the history of present illness.   All other systems are reviewed and negative.   Physical Exam: VS:  BP (!) 160/72   [name] 68   Ht 6' (1.829 m)   Wt 157 lb 1.9 oz (71.3 kg)   SpO2 97%   BMI 21.31 kg/m  .  BMI Body mass index is 21.31 kg/m.  Wt Readings from Last 3 Encounters:  01/29/19 157 lb 1.9 oz (71.3 kg)  01/07/19 155 lb 9.6 oz (70.6 kg)  12/09/18 153 lb (69.4 kg)   General: Alert and in no acute distress. He is rather tearful today.    HEENT: Normal.  Neck: Supple, no JVD, carotid bruits, or masses noted.  Cardiac: Regular rate and rhythm.  No edema.  Respiratory:  Lungs are clear to auscultation bilaterally with normal work of breathing.  GI: Soft and nontender.  MS: No deformity or atrophy. Gait and ROM intact. He is unsteady.  Skin: Warm and dry. Color  is normal. He has lots of residual bruising.  Neuro:  Strength and sensation are intact and no gross focal deficits noted.  Psych: Alert, appropriate and with normal affect.   LABORATORY DATA:  EKG:  EKG is ordered today. This demonstrates NSR - inferior Q's.  Lab Results  Component Value Date   WBC 5.3 12/09/2018   HGB 14.0 12/09/2018   HCT 40.1 12/09/2018   PLT 131 (L) 12/09/2018   GLUCOSE 92 12/09/2018   CHOL 91 11/15/2018   TRIG 39 11/15/2018   HDL 42 11/15/2018   LDLCALC 41 11/15/2018   ALT 14 11/27/2018   AST 12 11/27/2018   NA 140 12/09/2018   K 4.5 12/09/2018   CL 102 12/09/2018   CREATININE 1.12 12/09/2018   BUN 16 12/09/2018   CO2 23 12/09/2018   TSH 1.020 12/09/2018   INR 1.1 09/08/2008     BNP (last 3 results) Recent Labs    11/14/18 1717  BNP 82.0    ProBNP (last 3 results) No results for input(s): PROBNP in the last 8760 hours.   Other Studies Reviewed Today:   CARDIAC CATH: 11/18/2018 1. Severe native vessel coronary artery disease with total occlusion of the RCA, total occlusion of left circumflex, and severe stenosis leading into total occlusion of the mid LAD 2. Status post multivessel coronary bypass surgery with continued patency of the saphenous vein graft RCA, sequential saphenous vein graft to OM1 and distal circumflex, and LIMA to diagonal and mid LAD 3. Severe stenosis involving the ostium/proximal portion of the SVG to distal RCA, treated successfully with a 3.5 x 20 mm Synergy DES utilizing distal embolic protection  Recommendations: Dual antiplatelet therapy with aspirin and clopidogrel 12 months as tolerated Intervention   ECHO: 11/15/2018  1. The left ventricle has normal systolic function with an ejection fraction of 60-65%. The cavity size was normal. Left ventricular diastolic Doppler parameters are consistent with impaired relaxation. Elevated left atrial and left ventricular  end-diastolic pressures The E/e'  is >15.  No evidence of left ventricular regional wall motion abnormalities. 2. Left atrial size was moderately dilated. 3. The mitral valve is abnormal. Mild thickening of the mitral valve leaflet. Mild calcification of the mitral valve leaflet. There is mild to moderate mitral annular calcification present. 4. The tricuspid valve is grossly normal. 5. The aortic valve is abnormal. Moderate calcification of the aortic valve. Aortic valve regurgitation is mild to moderate by color flow Doppler. No stenosis of the aortic valve.  Assessment/Plan:  1. Unstable angina/CAD - s/p cath with recent PCI/DES to theSVG-distal RCA- sounds like his angina is now resolved.Unfortunately, his PCI has not really improved his fatigue. Has extensive diffuse native CAD - no further options felt to be needed for revascularization - trying to manage medically and overall a difficult situation - he has been placed on Patrick [name] - I do not see that this has helped - he is open to trying Imdur - we will start low dose. Overall prognosis is tenuous.    2. Pre syncope/orthostasis - now on Patrick [name] - remains off beta blocker and off ARB and off Cymbalta.   3. Profound fatigue- felt to be multifactorial - age is most certainly playing a role - in my opinion, he has not really made peace with the fact that he is older and that he has limitations - this was discussed at length.   4.Markedly abnormal TSH - now being managed by PCP  5.HTN- BP ok at home - little higher here today but he is quite upset.   6. HLD- on statin  7. Memory disorder- has been on Aricept for about a year now.   8. Legally blind now - not able to drive anymore - this certainly impacts his situation.   9. COVID-19 Education: The signs and symptoms of COVID-19 were discussed with the patient and how to seek care for testing (follow up with PCP or arrange E-visit).  The importance of social distancing, staying at home, hand hygiene and wearing a  mask when out in public were discussed today.  Current medicines are reviewed with the patient today.  The patient does not have concerns regarding medicines other than what has been noted above.  The following changes have been made:  See above.  Labs/ tests ordered today include:    Orders Placed This Encounter  Procedures  . EKG 12-Lead     Disposition:   FU with me in a month.   Patient is agreeable to this plan and will call if any problems develop in the interim.   SignedTruitt Merle, NP  01/29/2019 4:52 PM  [facility] [address] [place], [place]  [address] Phone: [phone] Fax: [phone]

## 2019-01-28 DIAGNOSIS — H33001 Unspecified retinal detachment with retinal break, right eye: Secondary | ICD-10-CM | POA: Diagnosis not present

## 2019-01-28 DIAGNOSIS — Z961 Presence of intraocular lens: Secondary | ICD-10-CM | POA: Diagnosis not present

## 2019-01-29 ENCOUNTER — Encounter: Payer: Self-pay | Admitting: Nurse Practitioner

## 2019-01-29 ENCOUNTER — Other Ambulatory Visit: Payer: Self-pay

## 2019-01-29 ENCOUNTER — Ambulatory Visit (INDEPENDENT_AMBULATORY_CARE_PROVIDER_SITE_OTHER): Payer: Medicare Other | Admitting: Nurse Practitioner

## 2019-01-29 ENCOUNTER — Other Ambulatory Visit: Payer: Self-pay | Admitting: Cardiovascular Disease

## 2019-01-29 VITALS — BP 160/72 | HR 68 | Ht 72.0 in | Wt 157.1 lb

## 2019-01-29 DIAGNOSIS — I200 Unstable angina: Secondary | ICD-10-CM

## 2019-01-29 DIAGNOSIS — I2510 Atherosclerotic heart disease of native coronary artery without angina pectoris: Secondary | ICD-10-CM

## 2019-01-29 DIAGNOSIS — E782 Mixed hyperlipidemia: Secondary | ICD-10-CM

## 2019-01-29 DIAGNOSIS — I10 Essential (primary) hypertension: Secondary | ICD-10-CM

## 2019-01-29 DIAGNOSIS — R5383 Other fatigue: Secondary | ICD-10-CM

## 2019-01-29 MED ORDER — ISOSORBIDE MONONITRATE ER 30 MG PO TB24: 15.0000 mg | ORAL_TABLET | Freq: Every day | ORAL | 3 refills | Status: DC

## 2019-01-29 NOTE — Patient Instructions (Addendum)
After Visit Summary:  We will be checking the following labs today - NONE   Medication Instructions:    Continue with your current medicines.   I am starting some low dose Imdur 30 mg - just 1/2 tablet at night - may cause a headache - ok to use Tylenol    If you need a refill on your cardiac medications before your next appointment, please call your pharmacy.     Testing/Procedures To Be Arranged:  N/A  Follow-Up:   See me in one month  No need to come next week for the nurse visit and EKG - this has been done today.     At [facility], you and your health needs are our priority.  As part of our continuing mission to provide you with exceptional heart care, we have created designated Provider Care Teams.  These Care Teams include your primary Cardiologist (physician) and Advanced Practice Providers (APPs -  Physician Assistants and Nurse Practitioners) who all work together to provide you with the care you need, when you need it.  Special Instructions:  . Stay safe, stay home, wash your hands for at least 20 seconds and wear a mask when out in public.  . It was good to talk with you today.  . Go to the [facility] - one is on [address] - get a walker   Call the [place] office at [phone] if you have any questions, problems or concerns.

## 2019-01-30 DIAGNOSIS — H43813 Vitreous degeneration, bilateral: Secondary | ICD-10-CM | POA: Diagnosis not present

## 2019-01-30 DIAGNOSIS — H43393 Other vitreous opacities, bilateral: Secondary | ICD-10-CM | POA: Diagnosis not present

## 2019-01-30 DIAGNOSIS — H33051 Total retinal detachment, right eye: Secondary | ICD-10-CM | POA: Diagnosis not present

## 2019-01-30 DIAGNOSIS — H35372 Puckering of macula, left eye: Secondary | ICD-10-CM | POA: Diagnosis not present

## 2019-01-31 ENCOUNTER — Telehealth: Payer: Self-pay | Admitting: Nurse Practitioner

## 2019-01-31 NOTE — Telephone Encounter (Signed)
Can you please call the Dr. Iona Hansen at [facility] [phone].  Please let them know that the above patient's wife reached out to me to ask about surgery next week for a detached retina and to stop Plavix for 5 days. Dr. Acie Fredrickson and I both have advised against this at this time.   He has had recent DES stent placement. He is NOT to have his Plavix interrupted and will not be able to proceed.   [name] is aware and will continue with the current medicines.   Thanks Burtis Junes, RN, [facility] [address] [place], [place]  [address] [phone]

## 2019-01-31 NOTE — Telephone Encounter (Signed)
Melissa with Dr Iona Hansen was notified pt CANNOT  proceed with procedure at this time This message was sent via epic ./cy

## 2019-02-05 ENCOUNTER — Telehealth: Payer: Self-pay | Admitting: Nurse Practitioner

## 2019-02-05 ENCOUNTER — Ambulatory Visit: Payer: Medicare Other

## 2019-02-05 DIAGNOSIS — E538 Deficiency of other specified B group vitamins: Secondary | ICD-10-CM | POA: Diagnosis not present

## 2019-02-05 NOTE — Telephone Encounter (Signed)
Forward to [employer]

## 2019-02-05 NOTE — Telephone Encounter (Signed)
New message  Drake Leach would like a call to discuss patient having a procedure done this afternoon. Please call.

## 2019-02-05 NOTE — Telephone Encounter (Signed)
S/w Drake Leach at the surgical center,  pt was  to have a vitrectomy today but due to pt's cardiac hx needs to be done at the hospital.  Cecille Rubin is aware and agrees with recommendation's.

## 2019-02-10 DIAGNOSIS — H33021 Retinal detachment with multiple breaks, right eye: Secondary | ICD-10-CM | POA: Diagnosis not present

## 2019-02-10 DIAGNOSIS — E039 Hypothyroidism, unspecified: Secondary | ICD-10-CM | POA: Diagnosis not present

## 2019-02-10 DIAGNOSIS — Z961 Presence of intraocular lens: Secondary | ICD-10-CM | POA: Diagnosis not present

## 2019-02-10 DIAGNOSIS — I252 Old myocardial infarction: Secondary | ICD-10-CM | POA: Diagnosis not present

## 2019-02-10 DIAGNOSIS — Z888 Allergy status to other drugs, medicaments and biological substances status: Secondary | ICD-10-CM | POA: Diagnosis not present

## 2019-02-10 DIAGNOSIS — I10 Essential (primary) hypertension: Secondary | ICD-10-CM | POA: Diagnosis not present

## 2019-02-10 DIAGNOSIS — Z951 Presence of aortocoronary bypass graft: Secondary | ICD-10-CM | POA: Diagnosis not present

## 2019-02-10 DIAGNOSIS — Z7902 Long term (current) use of antithrombotics/antiplatelets: Secondary | ICD-10-CM | POA: Diagnosis not present

## 2019-02-10 DIAGNOSIS — H33051 Total retinal detachment, right eye: Secondary | ICD-10-CM | POA: Diagnosis not present

## 2019-02-10 DIAGNOSIS — Z79899 Other long term (current) drug therapy: Secondary | ICD-10-CM | POA: Diagnosis not present

## 2019-02-10 DIAGNOSIS — Z7982 Long term (current) use of aspirin: Secondary | ICD-10-CM | POA: Diagnosis not present

## 2019-02-10 DIAGNOSIS — Z87891 Personal history of nicotine dependence: Secondary | ICD-10-CM | POA: Diagnosis not present

## 2019-02-11 DIAGNOSIS — H33051 Total retinal detachment, right eye: Secondary | ICD-10-CM | POA: Diagnosis not present

## 2019-02-12 DIAGNOSIS — R7301 Impaired fasting glucose: Secondary | ICD-10-CM | POA: Diagnosis not present

## 2019-02-12 DIAGNOSIS — E7849 Other hyperlipidemia: Secondary | ICD-10-CM | POA: Diagnosis not present

## 2019-02-12 DIAGNOSIS — E0590 Thyrotoxicosis, unspecified without thyrotoxic crisis or storm: Secondary | ICD-10-CM | POA: Diagnosis not present

## 2019-02-12 DIAGNOSIS — M859 Disorder of bone density and structure, unspecified: Secondary | ICD-10-CM | POA: Diagnosis not present

## 2019-02-12 DIAGNOSIS — M109 Gout, unspecified: Secondary | ICD-10-CM | POA: Diagnosis not present

## 2019-02-12 DIAGNOSIS — M8580 Other specified disorders of bone density and structure, unspecified site: Secondary | ICD-10-CM | POA: Diagnosis not present

## 2019-02-19 ENCOUNTER — Other Ambulatory Visit: Payer: Self-pay

## 2019-02-19 ENCOUNTER — Inpatient Hospital Stay (HOSPITAL_BASED_OUTPATIENT_CLINIC_OR_DEPARTMENT_OTHER)
Admission: EM | Admit: 2019-02-19 | Discharge: 2019-02-21 | DRG: 392 | Disposition: A | Discharge status: Home / Self Care | Payer: Medicare Other | Attending: Student | Admitting: Student

## 2019-02-19 ENCOUNTER — Ambulatory Visit: Payer: Medicare Other | Admitting: Nurse Practitioner

## 2019-02-19 ENCOUNTER — Telehealth: Payer: Self-pay | Admitting: Nurse Practitioner

## 2019-02-19 ENCOUNTER — Emergency Department (HOSPITAL_BASED_OUTPATIENT_CLINIC_OR_DEPARTMENT_OTHER): Payer: Medicare Other

## 2019-02-19 ENCOUNTER — Encounter (HOSPITAL_BASED_OUTPATIENT_CLINIC_OR_DEPARTMENT_OTHER): Payer: Self-pay | Admitting: Emergency Medicine

## 2019-02-19 DIAGNOSIS — K5900 Constipation, unspecified: Secondary | ICD-10-CM | POA: Diagnosis present

## 2019-02-19 DIAGNOSIS — Z66 Do not resuscitate: Secondary | ICD-10-CM | POA: Diagnosis present

## 2019-02-19 DIAGNOSIS — D72825 Bandemia: Secondary | ICD-10-CM | POA: Diagnosis not present

## 2019-02-19 DIAGNOSIS — I2510 Atherosclerotic heart disease of native coronary artery without angina pectoris: Secondary | ICD-10-CM | POA: Diagnosis present

## 2019-02-19 DIAGNOSIS — N401 Enlarged prostate with lower urinary tract symptoms: Secondary | ICD-10-CM | POA: Diagnosis not present

## 2019-02-19 DIAGNOSIS — N400 Enlarged prostate without lower urinary tract symptoms: Secondary | ICD-10-CM | POA: Diagnosis present

## 2019-02-19 DIAGNOSIS — E039 Hypothyroidism, unspecified: Secondary | ICD-10-CM | POA: Diagnosis present

## 2019-02-19 DIAGNOSIS — Z79899 Other long term (current) drug therapy: Secondary | ICD-10-CM

## 2019-02-19 DIAGNOSIS — N183 Chronic kidney disease, stage 3 unspecified: Secondary | ICD-10-CM | POA: Diagnosis present

## 2019-02-19 DIAGNOSIS — D696 Thrombocytopenia, unspecified: Secondary | ICD-10-CM | POA: Diagnosis not present

## 2019-02-19 DIAGNOSIS — I951 Orthostatic hypotension: Secondary | ICD-10-CM | POA: Diagnosis present

## 2019-02-19 DIAGNOSIS — I25810 Atherosclerosis of coronary artery bypass graft(s) without angina pectoris: Secondary | ICD-10-CM | POA: Diagnosis present

## 2019-02-19 DIAGNOSIS — M109 Gout, unspecified: Secondary | ICD-10-CM | POA: Diagnosis present

## 2019-02-19 DIAGNOSIS — Z7982 Long term (current) use of aspirin: Secondary | ICD-10-CM

## 2019-02-19 DIAGNOSIS — Z79890 Hormone replacement therapy (postmenopausal): Secondary | ICD-10-CM

## 2019-02-19 DIAGNOSIS — Z7902 Long term (current) use of antithrombotics/antiplatelets: Secondary | ICD-10-CM

## 2019-02-19 DIAGNOSIS — K219 Gastro-esophageal reflux disease without esophagitis: Secondary | ICD-10-CM | POA: Diagnosis present

## 2019-02-19 DIAGNOSIS — K5732 Diverticulitis of large intestine without perforation or abscess without bleeding: Principal | ICD-10-CM | POA: Diagnosis present

## 2019-02-19 DIAGNOSIS — D638 Anemia in other chronic diseases classified elsewhere: Secondary | ICD-10-CM | POA: Diagnosis present

## 2019-02-19 DIAGNOSIS — Z20828 Contact with and (suspected) exposure to other viral communicable diseases: Secondary | ICD-10-CM | POA: Diagnosis not present

## 2019-02-19 DIAGNOSIS — Z7189 Other specified counseling: Secondary | ICD-10-CM | POA: Diagnosis not present

## 2019-02-19 DIAGNOSIS — K5792 Diverticulitis of intestine, part unspecified, without perforation or abscess without bleeding: Secondary | ICD-10-CM

## 2019-02-19 DIAGNOSIS — K5730 Diverticulosis of large intestine without perforation or abscess without bleeding: Secondary | ICD-10-CM | POA: Diagnosis not present

## 2019-02-19 DIAGNOSIS — Z87891 Personal history of nicotine dependence: Secondary | ICD-10-CM

## 2019-02-19 DIAGNOSIS — R9431 Abnormal electrocardiogram [ECG] [EKG]: Secondary | ICD-10-CM | POA: Diagnosis not present

## 2019-02-19 DIAGNOSIS — I129 Hypertensive chronic kidney disease with stage 1 through stage 4 chronic kidney disease, or unspecified chronic kidney disease: Secondary | ICD-10-CM | POA: Diagnosis present

## 2019-02-19 DIAGNOSIS — F0390 Unspecified dementia without behavioral disturbance: Secondary | ICD-10-CM | POA: Diagnosis present

## 2019-02-19 DIAGNOSIS — Z955 Presence of coronary angioplasty implant and graft: Secondary | ICD-10-CM | POA: Diagnosis not present

## 2019-02-19 DIAGNOSIS — Z8249 Family history of ischemic heart disease and other diseases of the circulatory system: Secondary | ICD-10-CM

## 2019-02-19 DIAGNOSIS — Z951 Presence of aortocoronary bypass graft: Secondary | ICD-10-CM | POA: Diagnosis not present

## 2019-02-19 DIAGNOSIS — E876 Hypokalemia: Secondary | ICD-10-CM | POA: Diagnosis present

## 2019-02-19 DIAGNOSIS — E785 Hyperlipidemia, unspecified: Secondary | ICD-10-CM | POA: Diagnosis present

## 2019-02-19 DIAGNOSIS — I10 Essential (primary) hypertension: Secondary | ICD-10-CM | POA: Diagnosis not present

## 2019-02-19 LAB — CBC WITH DIFFERENTIAL/PLATELET
Abs Immature Granulocytes: 0.32 10*3/uL — ABNORMAL HIGH (ref 0.00–0.07)
Basophils Absolute: 0 10*3/uL (ref 0.0–0.1)
Basophils Relative: 0 %
Eosinophils Absolute: 0 10*3/uL (ref 0.0–0.5)
Eosinophils Relative: 0 %
HCT: 46 % (ref 39.0–52.0)
Hemoglobin: 15.2 g/dL (ref 13.0–17.0)
Immature Granulocytes: 1 %
Lymphocytes Relative: 6 %
Lymphs Abs: 1.3 10*3/uL (ref 0.7–4.0)
MCH: 33 pg (ref 26.0–34.0)
MCHC: 33 g/dL (ref 30.0–36.0)
MCV: 100 fL (ref 80.0–100.0)
Monocytes Absolute: 4 10*3/uL — ABNORMAL HIGH (ref 0.1–1.0)
Monocytes Relative: 17 %
Neutro Abs: 18.3 10*3/uL — ABNORMAL HIGH (ref 1.7–7.7)
Neutrophils Relative %: 76 %
Platelets: 106 10*3/uL — ABNORMAL LOW (ref 150–400)
RBC: 4.6 MIL/uL (ref 4.22–5.81)
RDW: 13.8 % (ref 11.5–15.5)
Smear Review: NORMAL
WBC: 24 10*3/uL — ABNORMAL HIGH (ref 4.0–10.5)
nRBC: 0 % (ref 0.0–0.2)

## 2019-02-19 LAB — URINALYSIS, ROUTINE W REFLEX MICROSCOPIC
Bilirubin Urine: NEGATIVE
Glucose, UA: NEGATIVE mg/dL
Hgb urine dipstick: NEGATIVE
Ketones, ur: NEGATIVE mg/dL
Leukocytes,Ua: NEGATIVE
Nitrite: NEGATIVE
Protein, ur: NEGATIVE mg/dL
Specific Gravity, Urine: 1.015 (ref 1.005–1.030)
pH: 6 (ref 5.0–8.0)

## 2019-02-19 LAB — SARS CORONAVIRUS 2 BY RT PCR (HOSPITAL ORDER, PERFORMED IN ~~LOC~~ HOSPITAL LAB): SARS Coronavirus 2: NEGATIVE

## 2019-02-19 LAB — COMPREHENSIVE METABOLIC PANEL
ALT: 19 U/L (ref 0–44)
AST: 23 U/L (ref 15–41)
Albumin: 4.4 g/dL (ref 3.5–5.0)
Alkaline Phosphatase: 51 U/L (ref 38–126)
Anion gap: 14 (ref 5–15)
BUN: 26 mg/dL — ABNORMAL HIGH (ref 8–23)
CO2: 26 mmol/L (ref 22–32)
Calcium: 8.9 mg/dL (ref 8.9–10.3)
Chloride: 95 mmol/L — ABNORMAL LOW (ref 98–111)
Creatinine, Ser: 1.15 mg/dL (ref 0.61–1.24)
GFR calc Af Amer: >60 mL/min (ref >60)
GFR calc non Af Amer: 56 mL/min — ABNORMAL LOW (ref >60)
Glucose, Bld: 131 mg/dL — ABNORMAL HIGH (ref 70–99)
Potassium: 3.3 mmol/L — ABNORMAL LOW (ref 3.5–5.1)
Sodium: 135 mmol/L (ref 135–145)
Total Bilirubin: 1.5 mg/dL — ABNORMAL HIGH (ref 0.3–1.2)
Total Protein: 7.2 g/dL (ref 6.5–8.1)

## 2019-02-19 LAB — LACTIC ACID, PLASMA
Lactic Acid, Venous: 2 mmol/L (ref 0.5–1.9)
Lactic Acid, Venous: 1.6 mmol/L (ref 0.5–1.9)

## 2019-02-19 LAB — LIPASE, BLOOD: Lipase: 20 U/L (ref 11–51)

## 2019-02-19 MED ORDER — OFLOXACIN 0.3 % OP SOLN
1.0000 [drp] | Freq: Four times a day (QID) | OPHTHALMIC | Status: DC
  Administered 2019-02-19 – 2019-02-21 (×7): 1 [drp] via OPHTHALMIC
  Filled 2019-02-19: qty 5

## 2019-02-19 MED ORDER — IOHEXOL 300 MG/ML  SOLN
100.0000 mL | Freq: Once | INTRAMUSCULAR | Status: AC | PRN
  Administered 2019-02-19: 100 mL via INTRAVENOUS

## 2019-02-19 MED ORDER — ONDANSETRON HCL 4 MG/2ML IJ SOLN
4.0000 mg | Freq: Once | INTRAMUSCULAR | Status: AC
  Administered 2019-02-19: 4 mg via INTRAVENOUS
  Filled 2019-02-19: qty 2

## 2019-02-19 MED ORDER — RANOLAZINE ER 500 MG PO TB12
500.0000 mg | ORAL_TABLET | Freq: Two times a day (BID) | ORAL | Status: DC
  Administered 2019-02-19 – 2019-02-21 (×4): 500 mg via ORAL
  Filled 2019-02-19 (×5): qty 1

## 2019-02-19 MED ORDER — ONDANSETRON HCL 4 MG/2ML IJ SOLN: 4.0000 mg | Freq: Four times a day (QID) | INTRAMUSCULAR | Status: DC | PRN

## 2019-02-19 MED ORDER — LEVOTHYROXINE SODIUM 75 MCG PO TABS
75.0000 ug | ORAL_TABLET | Freq: Every day | ORAL | Status: DC
  Administered 2019-02-20 – 2019-02-21 (×2): 75 ug via ORAL
  Filled 2019-02-19 (×2): qty 1

## 2019-02-19 MED ORDER — DORZOLAMIDE HCL-TIMOLOL MAL 2-0.5 % OP SOLN
1.0000 [drp] | Freq: Two times a day (BID) | OPHTHALMIC | Status: DC
  Administered 2019-02-19 – 2019-02-21 (×4): 1 [drp] via OPHTHALMIC
  Filled 2019-02-19: qty 10

## 2019-02-19 MED ORDER — KCL IN DEXTROSE-NACL 20-5-0.45 MEQ/L-%-% IV SOLN
INTRAVENOUS | Status: DC
  Administered 2019-02-19 – 2019-02-21 (×2): via INTRAVENOUS
  Filled 2019-02-19 (×2): qty 1000

## 2019-02-19 MED ORDER — ALLOPURINOL 300 MG PO TABS
300.0000 mg | ORAL_TABLET | Freq: Every day | ORAL | Status: DC
  Administered 2019-02-19 – 2019-02-21 (×3): 300 mg via ORAL
  Filled 2019-02-19 (×3): qty 1

## 2019-02-19 MED ORDER — POLYETHYLENE GLYCOL 3350 17 G PO PACK
17.0000 g | PACK | Freq: Every day | ORAL | Status: DC | PRN
  Administered 2019-02-20: 17 g via ORAL
  Filled 2019-02-19: qty 1

## 2019-02-19 MED ORDER — PIPERACILLIN-TAZOBACTAM 3.375 G IVPB 30 MIN
3.3750 g | Freq: Once | INTRAVENOUS | Status: AC
  Administered 2019-02-19: 3.375 g via INTRAVENOUS
  Filled 2019-02-19 (×2): qty 50

## 2019-02-19 MED ORDER — SODIUM CHLORIDE 0.9 % IV SOLN
Freq: Once | INTRAVENOUS | Status: AC
  Administered 2019-02-19: 13:00:00 via INTRAVENOUS

## 2019-02-19 MED ORDER — PREDNISOLONE ACETATE 1 % OP SUSP
1.0000 [drp] | Freq: Four times a day (QID) | OPHTHALMIC | Status: DC
  Administered 2019-02-19 – 2019-02-21 (×7): 1 [drp] via OPHTHALMIC
  Filled 2019-02-19: qty 5

## 2019-02-19 MED ORDER — ACETAMINOPHEN 325 MG PO TABS
650.0000 mg | ORAL_TABLET | Freq: Once | ORAL | Status: AC
  Administered 2019-02-19: 650 mg via ORAL
  Filled 2019-02-19: qty 2

## 2019-02-19 MED ORDER — TAMSULOSIN HCL 0.4 MG PO CAPS
0.4000 mg | ORAL_CAPSULE | Freq: Every day | ORAL | Status: DC
  Administered 2019-02-19 – 2019-02-21 (×3): 0.4 mg via ORAL
  Filled 2019-02-19 (×3): qty 1

## 2019-02-19 MED ORDER — SODIUM CHLORIDE 0.9% FLUSH
3.0000 mL | Freq: Two times a day (BID) | INTRAVENOUS | Status: DC
  Administered 2019-02-19 – 2019-02-20 (×2): 3 mL via INTRAVENOUS

## 2019-02-19 MED ORDER — FENTANYL CITRATE (PF) 100 MCG/2ML IJ SOLN
50.0000 ug | INTRAMUSCULAR | Status: DC | PRN
  Administered 2019-02-19: 50 ug via INTRAVENOUS
  Filled 2019-02-19: qty 2

## 2019-02-19 MED ORDER — PIPERACILLIN-TAZOBACTAM 3.375 G IVPB
3.3750 g | Freq: Three times a day (TID) | INTRAVENOUS | Status: DC
  Administered 2019-02-19 – 2019-02-21 (×6): 3.375 g via INTRAVENOUS
  Filled 2019-02-19 (×6): qty 50

## 2019-02-19 MED ORDER — FENTANYL CITRATE (PF) 100 MCG/2ML IJ SOLN
50.0000 ug | Freq: Once | INTRAMUSCULAR | Status: AC
  Administered 2019-02-19: 50 ug via INTRAVENOUS
  Filled 2019-02-19: qty 2

## 2019-02-19 MED ORDER — ENOXAPARIN SODIUM 40 MG/0.4ML ~~LOC~~ SOLN
40.0000 mg | SUBCUTANEOUS | Status: DC
  Administered 2019-02-19: 40 mg via SUBCUTANEOUS
  Filled 2019-02-19: qty 0.4

## 2019-02-19 MED ORDER — ACETAMINOPHEN 325 MG PO TABS: 650.0000 mg | ORAL_TABLET | Freq: Four times a day (QID) | ORAL | Status: DC | PRN

## 2019-02-19 MED ORDER — OXYCODONE HCL 5 MG PO TABS
5.0000 mg | ORAL_TABLET | ORAL | Status: DC | PRN
  Administered 2019-02-19: 5 mg via ORAL
  Filled 2019-02-19: qty 1

## 2019-02-19 MED ORDER — ATROPINE SULFATE 1 % OP SOLN
1.0000 [drp] | Freq: Every day | OPHTHALMIC | Status: DC
  Administered 2019-02-19 – 2019-02-20 (×2): 1 [drp] via OPHTHALMIC
  Filled 2019-02-19: qty 2

## 2019-02-19 MED ORDER — ACETAMINOPHEN 650 MG RE SUPP: 650.0000 mg | Freq: Four times a day (QID) | RECTAL | Status: DC | PRN

## 2019-02-19 MED ORDER — ASPIRIN EC 81 MG PO TBEC
81.0000 mg | DELAYED_RELEASE_TABLET | Freq: Every day | ORAL | Status: DC
  Administered 2019-02-19 – 2019-02-21 (×3): 81 mg via ORAL
  Filled 2019-02-19 (×3): qty 1

## 2019-02-19 MED ORDER — SODIUM CHLORIDE 0.9 % IV BOLUS
1000.0000 mL | Freq: Once | INTRAVENOUS | Status: AC
  Administered 2019-02-19: 1000 mL via INTRAVENOUS

## 2019-02-19 MED ORDER — DONEPEZIL HCL 10 MG PO TABS
10.0000 mg | ORAL_TABLET | Freq: Every day | ORAL | Status: DC
  Administered 2019-02-19 – 2019-02-20 (×2): 10 mg via ORAL
  Filled 2019-02-19 (×2): qty 1

## 2019-02-19 MED ORDER — ONDANSETRON HCL 4 MG PO TABS: 4.0000 mg | ORAL_TABLET | Freq: Four times a day (QID) | ORAL | Status: DC | PRN

## 2019-02-19 NOTE — Telephone Encounter (Signed)
Received messages from patient's wife last night - [name]'s BP was in the 99991111 systolic.   I stopped the Florinef. She was able to find an old bottle of Losartan 100 mg - I instructed her to give him a half dose of Losartan.   An hour later BP was 208/97 - he was not able to walk - was shaky and having significant headache - advised to call EMS.   She sent me another message later last night - EMS had come out - they did NOT transport him to the hospital but told her they thought he needed fluids and was dehydrated; that his shaking was from the thyroid med being increased earlier that day; that his blood sugar was high and to see him in the office on Wednesday.   This morning, I have called [name] - tentatively going to try and see [name] later this afternoon - his BP is 167/80 - he is very weak, not eating or drinking and having dry heaves. At some point he has begun to have excruciating pain under the umbilicus. She is going to try and get him to the ER on [address] for further evaluation.   Burtis Junes, RN, [facility] [address] [place], [place]  [address] [phone]

## 2019-02-19 NOTE — ED Triage Notes (Signed)
Multiple complaints.  Abdominal pain, constipation, High blood pressure.  Wife called pmd and then they called EMS.  They came out and checked him out and said he was dehydrated.  Wife called pmd back this morning and they said to bring him here.

## 2019-02-19 NOTE — H&P (Signed)
[facility] History and Physical   Patient: Patrick [name] [id number]   PCP: Marton Redwood, MD DOB: 09-04-1926   DOA: 02/19/2019   DOS: 02/19/2019   DOS: the patient was seen and examined on 02/19/2019  Patient coming from: The patient is coming from Home  Chief Complaint: Abdominal pain  HPI: Patrick Rica H Tien is a 85 y.o. male with Past medical history of BPH, GERD, ED, gout, CAD S/P CABG and recent PCI in July 2020.  HLD. Patient presents with complaints of abdominal pain. For last 2 days patient has been having some abdominal pain. For last 1 week patient has been suffering from constipation. Patient is not passing gas. Patient did try to use some suppository yesterday after which his pain was extreme. Patient actually called his cardiology due to high blood pressure. Patient had some nausea and vomiting when he woke up in the morning and he was brought to the hospital as recommended by the cardiology. Patient denied any fever or chills at the time of my evaluation. Denies any nausea. Denies any chest pain shortness of breath cough dizziness or lightheadedness. Patient recently underwent eye surgery office left eye for retinal detachment and has to lay on his left lateral side.  ED Course: Presented at [facility].  With abdominal pain.  CT abdomen pelvis was suggestive of diverticulitis with micropore.  Patient was referred for admission.  At his baseline ambulates without assistance independent for most of his ADL;  manages his medication on his own.  Review of Systems: as mentioned in the history of present illness.  All other systems reviewed and are negative.  Past Medical History:  Diagnosis Date   BPH (benign prostatic hypertrophy)    Diverticulosis    ED (erectile dysfunction)    GERD (gastroesophageal reflux disease)    Gout    Hepatitis    hx hepatitis after mono as teenager   History of kidney stones    History of skin cancer     Hyperlipidemia    Hypertension    Hypothyroidism    IHD (ischemic heart disease)    Remote CABG in 1997   MI, acute, non ST segment elevation ([place]) 2010   s/p cath with occluded SVG to PD that fills by collaterals and the remainder of his revasculsarization is satisfactory. "the first time they did the stent , the second time they did the graft 8 vessels"   Nocturia    Thrombocytopenia ([place]) 11/19/2018   Urinary frequency    Past Surgical History:  Procedure Laterality Date   APPENDECTOMY     CARDIAC CATHETERIZATION  09/08/2008   NORMAL. EF 60%; Occluded SVG to PD that fills by collaterals and the remainder of his revascularization is satisfactory. he is managed medically.    CORONARY ARTERY BYPASS GRAFT  1997   CABG x 8   CORONARY STENT INTERVENTION N/A 11/18/2018   Procedure: CORONARY STENT INTERVENTION;  Surgeon: Sherren Mocha, MD;  Location: [place] [facility];  Service: Cardiovascular;  Laterality: N/A;   CYSTOSCOPY WITH INSERTION OF UROLIFT N/A 03/19/2015   Procedure: CYSTOSCOPY WITH INSERTION OF FOUR UROLIFTS;  Surgeon: Carolan Clines, MD;  Location: [facility];  Service: Urology;  Laterality: N/A;   ESOPHAGOGASTRODUODENOSCOPY (EGD) WITH PROPOFOL N/A 02/12/2017   Procedure: ESOPHAGOGASTRODUODENOSCOPY (EGD) WITH PROPOFOL;  Surgeon: Otis Brace, MD;  Location: [facility];  Service: Gastroenterology;  Laterality: N/A;   ESOPHAGOGASTRODUODENOSCOPY (EGD) WITH PROPOFOL N/A 07/13/2018   Procedure: ESOPHAGOGASTRODUODENOSCOPY (EGD) WITH PROPOFOL;  Surgeon: Watt Climes,  Altamese Dilling, MD;  Location: [facility];  Service: Endoscopy;  Laterality: N/A;   FOREIGN BODY REMOVAL N/A 07/13/2018   Procedure: FOREIGN BODY REMOVAL;  Surgeon: Clarene Essex, MD;  Location: [facility];  Service: Endoscopy;  Laterality: N/A;   LEFT HEART CATH AND CORS/GRAFTS ANGIOGRAPHY N/A 11/18/2018   Procedure: LEFT HEART CATH AND CORS/GRAFTS ANGIOGRAPHY;  Surgeon: Sherren Mocha, MD;  Location: [place] [facility];   Service: Cardiovascular;  Laterality: N/A;   Social History:  reports that he quit smoking about 60 years ago. His smoking use included cigarettes. He has a 15.00 pack-year smoking history. He has never used smokeless tobacco. He reports current alcohol use. He reports that he does not use drugs.  Allergies  Allergen Reactions   Halcion [Triazolam]     "Makes me crazy"   Pravachol     Unknown     Family history reviewed and not pertinent Family History  Problem Relation Age of Onset   Heart failure Mother 52     Prior to Admission medications   Medication Sig Start Date End Date Taking? Authorizing Provider  allopurinol (ZYLOPRIM) 300 MG tablet Take 300 mg by mouth daily.   Yes [provider]  aspirin 81 MG tablet Take 81 mg by mouth daily.     Yes [provider]  atorvastatin (LIPITOR) 20 MG tablet Take 1 tablet (20 mg total) by mouth daily. 02/25/18 02/20/19 Yes Daune Perch, NP  atropine 1 % ophthalmic solution Place 1 drop into the right eye daily. Does at bedtime 02/11/19  Yes [provider]  clopidogrel (PLAVIX) 75 MG tablet Take 1 tablet (75 mg total) by mouth daily with breakfast. 11/20/18  Yes Barrett, Rhonda G, PA-C  donepezil (ARICEPT) 10 MG tablet Take 10 mg by mouth at bedtime.  02/13/18  Yes [provider]  dorzolamide-timolol (COSOPT) 22.3-6.8 MG/ML ophthalmic solution Place 1 drop into the right eye 2 (two) times daily. Does in morning and at dinner time. 02/11/19  Yes [provider]  fludrocortisone (FLORINEF) 0.1 MG tablet Take 1 tablet (0.1 mg total) by mouth daily. 12/18/18  Yes Burtis Junes, NP  levothyroxine (SYNTHROID) 75 MCG tablet Take 75 mcg by mouth daily before breakfast.    Yes [provider]  ofloxacin (OCUFLOX) 0.3 % ophthalmic solution Place 1 drop into the right eye 4 (four) times daily. Do drops at breakfast,lunch,dinner & bedtime. 01/30/19  Yes [provider]  prednisoLONE acetate  (PRED FORTE) 1 % ophthalmic suspension Place 1 drop into the right eye 4 (four) times daily. Do drops at breakfast,lunch,dinner and bedtime. 01/30/19  Yes [provider]  ranolazine (RANEXA) 500 MG 12 hr tablet TAKE 1 TABLET BY MOUTH TWICE A DAY Patient taking differently: Take 500 mg by mouth 2 (two) times daily.  01/29/19  Yes Nahser, Wonda Cheng, MD  tamsulosin (FLOMAX) 0.4 MG CAPS capsule Take 0.4 mg by mouth daily.    Yes [provider]  Vitamin D, Ergocalciferol, (DRISDOL) 50000 units CAPS capsule Take 50,000 Units by mouth once a week. 07/31/17  Yes [provider]  isosorbide mononitrate (IMDUR) 30 MG 24 hr tablet Take 0.5 tablets (15 mg total) by mouth daily. Patient not taking: Reported on 02/19/2019 01/29/19 04/29/19  Burtis Junes, NP  nitroGLYCERIN (NITROSTAT) 0.4 MG SL tablet PLACE 1 TABLET (0.4 MG TOTAL) UNDER THE TONGUE EVERY 5 (FIVE) MINUTES AS NEEDED. 06/24/18   Nahser, Wonda Cheng, MD    Physical Exam: Vitals:   02/19/19 1330  02/19/19 1454 02/19/19 1502 02/19/19 1603  BP: (!) 141/85  (!) 103/49   Pulse:   80   Resp:   14   Temp:    97.6 F (36.4 C)  TempSrc:    Oral  SpO2:   94%   Weight:  71.3 kg    Height:  6' (1.829 m)      General: alert and oriented to time, place, and person. Appear in mild distress, affect appropriate Eyes: PERRL, Conjunctiva normal ENT: Oral Mucosa Clear, moist  Neck: no JVD, no Abnormal Mass Or lumps Cardiovascular: S1 and S2 Present, no Murmur, peripheral pulses symmetrical Respiratory: good respiratory effort, Bilateral Air entry equal and Decreased, no signs of accessory muscle use, Clear to Auscultation, no Crackles, no wheezes Abdomen: Bowel Sound present, Soft and mild tenderness, no hernia Skin: no rashes  Extremities: no Pedal edema, no calf tenderness Neurologic: without any new focal findings Gait not checked due to patient safety concerns  Data Reviewed: I have personally reviewed and interpreted labs,  imaging as discussed below.  CBC: Recent Labs  Lab 02/19/19 0903  WBC 24.0*  NEUTROABS 18.3*  HGB 15.2  HCT 46.0  MCV 100.0  PLT [id number]*   Basic Metabolic Panel: Recent Labs  Lab 02/19/19 0903  NA 135  K 3.3*  CL 95*  CO2 26  GLUCOSE 131*  BUN 26*  CREATININE 1.15  CALCIUM 8.9   GFR: Estimated Creatinine Clearance: 43.9 mL/min (by C-G formula based on SCr of 1.15 mg/dL). Liver Function Tests: Recent Labs  Lab 02/19/19 0903  AST 23  ALT 19  ALKPHOS 51  BILITOT 1.5*  PROT 7.2  ALBUMIN 4.4   Recent Labs  Lab 02/19/19 0903  LIPASE 20   No results for input(s): AMMONIA in the last 168 hours. Coagulation Profile: No results for input(s): INR, PROTIME in the last 168 hours. Cardiac Enzymes: No results for input(s): CKTOTAL, CKMB, CKMBINDEX, TROPONINI in the last 168 hours. BNP (last 3 results) No results for input(s): PROBNP in the last 8760 hours. HbA1C: No results for input(s): HGBA1C in the last 72 hours. CBG: No results for input(s): GLUCAP in the last 168 hours. Lipid Profile: No results for input(s): CHOL, HDL, LDLCALC, TRIG, CHOLHDL, LDLDIRECT in the last 72 hours. Thyroid Function Tests: No results for input(s): TSH, T4TOTAL, FREET4, T3FREE, THYROIDAB in the last 72 hours. Anemia Panel: No results for input(s): VITAMINB12, FOLATE, FERRITIN, TIBC, IRON, RETICCTPCT in the last 72 hours. Urine analysis:    Component Value Date/Time   COLORURINE YELLOW 02/19/2019 1130   APPEARANCEUR CLEAR 02/19/2019 1130   LABSPEC 1.015 02/19/2019 1130   PHURINE 6.0 02/19/2019 1130   GLUCOSEU NEGATIVE 02/19/2019 1130   HGBUR NEGATIVE 02/19/2019 1130   BILIRUBINUR NEGATIVE 02/19/2019 1130   KETONESUR NEGATIVE 02/19/2019 1130   PROTEINUR NEGATIVE 02/19/2019 1130   UROBILINOGEN 1.0 03/20/2015 1813   NITRITE NEGATIVE 02/19/2019 1130   LEUKOCYTESUR NEGATIVE 02/19/2019 1130    Radiological Exams on Admission: Ct Abdomen Pelvis W Contrast  Result Date:  02/19/2019 CLINICAL DATA:  Abdominal pain in the left lower quadrant. EXAM: CT ABDOMEN AND PELVIS WITH CONTRAST TECHNIQUE: Multidetector CT imaging of the abdomen and pelvis was performed using the standard protocol following bolus administration of intravenous contrast. CONTRAST:  164mL OMNIPAQUE IOHEXOL 300 MG/ML  SOLN COMPARISON:  None. FINDINGS: Lower chest: There is bibasilar atelectasis. Vascular calcifications are seen in the coronary arteries. Hepatobiliary: No focal liver abnormality is seen. No gallstones, gallbladder wall thickening, or biliary  dilatation. Pancreas: Unremarkable. No pancreatic ductal dilatation or surrounding inflammatory changes. Spleen: A crescent-shaped hypoechoic area along the medial splenic capsule measures 3.0 x 2.7 x 1.0 cm. Adrenals/Urinary Tract: Adrenal glands are unremarkable. Other than bilateral renal cysts, the kidneys are normal, without renal calculi, focal lesion, or hydronephrosis. Bladder is unremarkable. Stomach/Bowel: Stomach is within normal limits. Contrast extends to the midportion of the small bowel. The appendix is not definitely identified, however no pericecal inflammatory changes are identified to suggest acute appendicitis. There are diverticula involving the descending and sigmoid colon. There is bowel wall thickening and mild associated fat stranding of the sigmoid colon, suggestive of acute diverticulitis. There is a single locule of intraperitoneal gas seen in the anterior aspect of the left lower quadrant (series 2, image 61). There is no evidence of abscess or fistula formation. Vascular/Lymphatic: Aortic atherosclerosis. No enlarged abdominal or pelvic lymph nodes. Reproductive: The prostate is enlarged, measuring 5.3 cm in transverse dimension. Metallic material within the prostate likely represents prior urolift procedure. Other: No abdominal wall hernia or abnormality. No free fluid is seen in the pelvis. Musculoskeletal: No fracture is seen.  Degenerative changes are seen in the lumbar spine including bilateral L5 pars defects with grade 1 anterolisthesis of L5 on S1. IMPRESSION: 1. Acute diverticulosis of the descending and sigmoid colon with a tiny amount of nearby intraperitoneal gas. No evidence of abscess or fistula formation. 2. Indeterminate crescent-shaped hypoechoic area along the medial splenic capsule. This could represent prior trauma or a small amount of intraperitoneal fluid. Follow-up MR abdomen in 6 months recommended. This recommendation follows ACR consensus guidelines: White Paper of the ACR Incidental Findings Committee II on Splenic and Nodal Findings. [place] [phone]. Aortic Atherosclerosis (ICD10-I70.0). Critical Value/emergent results were called by telephone at the time of interpretation on 02/19/2019 at 12:05 pm to providerMICHAEL BUTLER , who verbally acknowledged these results. Electronically Signed   By: Zerita Boers M.D.   On: 02/19/2019 12:10   I reviewed all nursing notes, pharmacy notes, vitals, pertinent old records.  Assessment/Plan 1.  Acute diverticulitis CT scan shows mild thickening of the abdomen. There is also microperforation noted. No significant abscess. At present patient hemodynamically stable. Significant leukocytosis on lab work. We will treat with IV Zosyn and monitor. If the patient does not improve with current treatment will require input from surgery as well as IR. Currently n.p.o. IV Zosyn and IV fluids.  IV pain medication as well.  2.  CAD S/P CABG. Recent PCI in July 2020. Discussed with cardiology on-call with Dr. [place]. Patient recently had a PCI done with synergy stent. Patient has been recommended to be on aspirin and Plavix for 1 year. Patient is 3 months out of the procedure. Currently if the patient is going to require a procedure in that case we will be holding the Plavix. As soon as it is identified that the patient is going to be managed  conservatively on an ongoing basis we should resume Plavix.  3.  Recent eye surgery for retinal detachment. Continue home eyedrops as well as maintain left lateral position protection patient is unable to sit up secondary to the same and will complicate recovery due to poor mobility.  4.  Hypothyroidism. Orthostatic hypotension. Patient was recently started on 66 MCG of Synthroid. Prior to this patient was on 44 MCG and a few weeks ago patient was on 25 MCG of Synthroid. Patient was also started on Florinef for orthostatic hypotension by cardiology. Continue to monitor  for now.  5.  Constipation. We will continue with MiraLAX.  6.  Essential hypertension. Holding antihypertensive regimen pretension continue Ranexa for his CAD and so monitor.  7.  BPH. Continuing Flomax for now. Monitor for orthostasis.  Nutrition: N.p.o. DVT Prophylaxis: Subcutaneous Lovenox  Advance goals of care discussion: DNR   Consults: none  I personally Discussed with cardiology on-call  Family Communication: family was present at bedside, at the time of interview.  Opportunity was given to ask question and all questions were answered satisfactorily.  Disposition: Admitted as inpatient, med surge unit. Likely to be discharged home, in 3 days.  I have discussed plan of care as described above with RN and patient/family.  Author: Berle Mull, MD [facility] 02/19/2019 7:29 PM   To reach On-call, see care teams to locate the attending and reach out to them via [email or web address]. If 7PM-7AM, please contact night-coverage If you still have difficulty reaching the attending provider, please page the [facility] (Director on Call) for [facility] on amion for assistance.

## 2019-02-19 NOTE — ED Notes (Signed)
Patient transported to CT 

## 2019-02-19 NOTE — ED Provider Notes (Signed)
[facility] EMERGENCY DEPARTMENT Provider Note   CSN: [id number] Arrival date & time: 02/19/19  [id number]     History   Chief Complaint Chief Complaint  Patient presents with   Abdominal Pain    HPI Patrick Rica H Remmel is a 85 y.o. male.  He is brought in by his daughter for evaluation of multiple symptoms.  He has been constipated for a few days and starting yesterday had moderate crampy low abdominal pain.  Worse with palpation.  She also noticed his blood pressure was high.  He was trembling and shaky walking and EMS evaluated him yesterday but did not transport him.  She said he still felt unwell today and so brought him to the emergency department.  He recently had retina surgery for detached retina and still has some right eye pain.  No fevers or chills no cough no chest pain no nausea vomiting or diarrhea or urinary symptoms.  No rashes or swollen joints.     The history is provided by the patient.  Abdominal Pain Pain location:  LLQ, RLQ and suprapubic Pain quality: aching   Pain radiates to:  Does not radiate Pain severity:  Moderate Onset quality:  Gradual Duration:  24 hours Timing:  Constant Progression:  Unchanged Chronicity:  New Context: laxative use   Context: not recent illness, not recent travel, not retching, not sick contacts, not suspicious food intake and not trauma   Relieved by:  Nothing Worsened by:  Palpation Ineffective treatments: laxative. Associated symptoms: constipation   Associated symptoms: no chest pain, no chills, no cough, no diarrhea, no dysuria, no fever, no hematemesis, no hematochezia, no hematuria, no melena, no nausea, no shortness of breath, no sore throat and no vomiting   Risk factors: being elderly     Past Medical History:  Diagnosis Date   BPH (benign prostatic hypertrophy)    Diverticulosis    ED (erectile dysfunction)    GERD (gastroesophageal reflux disease)    Gout    Hepatitis    hx hepatitis after mono as  teenager   History of kidney stones    History of skin cancer    Hyperlipidemia    Hypertension    Hypothyroidism    IHD (ischemic heart disease)    Remote CABG in 1997   MI, acute, non ST segment elevation ([place]) 2010   s/p cath with occluded SVG to PD that fills by collaterals and the remainder of his revasculsarization is satisfactory. "the first time they did the stent , the second time they did the graft 8 vessels"   Nocturia    Thrombocytopenia ([place]) 11/19/2018   Urinary frequency     Patient Active Problem List   Diagnosis Date Noted   Thrombocytopenia ([place]) 11/19/2018   Unstable angina ([place]) 11/14/2018   Essential (primary) hypertension 02/25/2018   Claudication ([place]) 04/21/2016   History of cold sores 03/22/2011   Coronary artery disease involving native coronary artery of native heart without angina pectoris 10/28/2010   Hyperlipemia 10/28/2010   Hypothyroidism 10/28/2010    Past Surgical History:  Procedure Laterality Date   APPENDECTOMY     CARDIAC CATHETERIZATION  09/08/2008   NORMAL. EF 60%; Occluded SVG to PD that fills by collaterals and the remainder of his revascularization is satisfactory. he is managed medically.    CORONARY ARTERY BYPASS GRAFT  1997   CABG x 8   CORONARY STENT INTERVENTION N/A 11/18/2018   Procedure: CORONARY STENT INTERVENTION;  Surgeon: Sherren Mocha, MD;  Location:  [facility];  Service: Cardiovascular;  Laterality: N/A;   CYSTOSCOPY WITH INSERTION OF UROLIFT N/A 03/19/2015   Procedure: CYSTOSCOPY WITH INSERTION OF FOUR UROLIFTS;  Surgeon: Carolan Clines, MD;  Location: [facility];  Service: Urology;  Laterality: N/A;   ESOPHAGOGASTRODUODENOSCOPY (EGD) WITH PROPOFOL N/A 02/12/2017   Procedure: ESOPHAGOGASTRODUODENOSCOPY (EGD) WITH PROPOFOL;  Surgeon: Otis Brace, MD;  Location: [facility];  Service: Gastroenterology;  Laterality: N/A;   ESOPHAGOGASTRODUODENOSCOPY (EGD) WITH PROPOFOL N/A 07/13/2018    Procedure: ESOPHAGOGASTRODUODENOSCOPY (EGD) WITH PROPOFOL;  Surgeon: Clarene Essex, MD;  Location: [facility];  Service: Endoscopy;  Laterality: N/A;   FOREIGN BODY REMOVAL N/A 07/13/2018   Procedure: FOREIGN BODY REMOVAL;  Surgeon: Clarene Essex, MD;  Location: [facility];  Service: Endoscopy;  Laterality: N/A;   LEFT HEART CATH AND CORS/GRAFTS ANGIOGRAPHY N/A 11/18/2018   Procedure: LEFT HEART CATH AND CORS/GRAFTS ANGIOGRAPHY;  Surgeon: Sherren Mocha, MD;  Location: [facility];  Service: Cardiovascular;  Laterality: N/A;        Home Medications    Prior to Admission medications   Medication Sig Start Date End Date Taking? Authorizing Provider  allopurinol (ZYLOPRIM) 300 MG tablet Take 300 mg by mouth daily.    [provider]  aspirin 81 MG tablet Take 81 mg by mouth daily.      [provider]  atorvastatin (LIPITOR) 20 MG tablet Take 1 tablet (20 mg total) by mouth daily. 02/25/18 02/20/19  Daune Perch, NP  clopidogrel (PLAVIX) 75 MG tablet Take 1 tablet (75 mg total) by mouth daily with breakfast. 11/20/18   Barrett, Evelene Croon, PA-C  donepezil (ARICEPT) 10 MG tablet Take 10 mg by mouth at bedtime.  02/13/18   [provider]  fludrocortisone (FLORINEF) 0.1 MG tablet Take 1 tablet (0.1 mg total) by mouth daily. 12/18/18   Burtis Junes, NP  isosorbide mononitrate (IMDUR) 30 MG 24 hr tablet Take 0.5 tablets (15 mg total) by mouth daily. 01/29/19 04/29/19  Burtis Junes, NP  levothyroxine (SYNTHROID) 50 MCG tablet Take 50 mcg by mouth daily before breakfast.    [provider]  nitroGLYCERIN (NITROSTAT) 0.4 MG SL tablet PLACE 1 TABLET (0.4 MG TOTAL) UNDER THE TONGUE EVERY 5 (FIVE) MINUTES AS NEEDED. 06/24/18   Nahser, Wonda Cheng, MD  ranolazine (RANEXA) 500 MG 12 hr tablet TAKE 1 TABLET BY MOUTH TWICE A DAY 01/29/19   Nahser, Wonda Cheng, MD  tamsulosin (FLOMAX) 0.4 MG CAPS capsule Take 0.8 mg by mouth daily.     [provider]  Vitamin D,  Ergocalciferol, (DRISDOL) 50000 units CAPS capsule Take 50,000 Units by mouth once a week. 07/31/17   [provider]    Family History Family History  Problem Relation Age of Onset   Heart failure Mother 6    Social History Social History   Tobacco Use   Smoking status: Former Smoker    Packs/day: 1.00    Years: 15.00    Pack years: 15.00    Types: Cigarettes    Quit date: 05/15/1958    Years since quitting: 60.8   Smokeless tobacco: Never Used  Substance Use Topics   Alcohol use: Yes    Comment: occasional beer   Drug use: No     Allergies   Halcion [triazolam] and Pravachol   Review of Systems Review of Systems  Constitutional: Negative for chills and fever.  HENT: Negative for sore throat.   Eyes: Positive for visual disturbance (blind r eye).  Respiratory:  Negative for cough and shortness of breath.   Cardiovascular: Negative for chest pain.  Gastrointestinal: Positive for abdominal pain and constipation. Negative for diarrhea, hematemesis, hematochezia, melena, nausea and vomiting.  Endocrine: Positive for polyuria.  Genitourinary: Negative for dysuria and hematuria.  Musculoskeletal: Negative for neck pain.  Skin: Negative for rash.  Neurological: Positive for tremors.     Physical Exam Updated Vital Signs BP (!) 103/49 (BP Location: Right Arm)    Pulse 80    Temp 97.6 F (36.4 C) (Oral)    Resp 14    Ht 6' (1.829 m)    Wt 71.3 kg    SpO2 94%    BMI 21.31 kg/m   Physical Exam Vitals signs and nursing note reviewed.  Constitutional:      Appearance: He is well-developed.  HENT:     Head: Normocephalic and atraumatic.  Eyes:     Conjunctiva/sclera: Conjunctivae normal.  Neck:     Musculoskeletal: Neck supple.  Cardiovascular:     Rate and Rhythm: Normal rate and regular rhythm.     Pulses: Normal pulses.     Heart sounds: No murmur.  Pulmonary:     Effort: Pulmonary effort is normal. No respiratory distress.     Breath sounds:  Normal breath sounds.  Abdominal:     Palpations: Abdomen is soft. There is no pulsatile mass.     Tenderness: There is abdominal tenderness in the right lower quadrant, suprapubic area and left lower quadrant. There is no guarding or rebound.  Musculoskeletal: Normal range of motion.     Right lower leg: No edema.     Left lower leg: No edema.  Skin:    General: Skin is warm and dry.     Capillary Refill: Capillary refill takes less than 2 seconds.  Neurological:     General: No focal deficit present.     Mental Status: He is alert. Mental status is at baseline.      ED Treatments / Results  Labs (all labs ordered are listed, but only abnormal results are displayed) Labs Reviewed  COMPREHENSIVE METABOLIC PANEL - Abnormal; Notable for the following components:      Result Value   Potassium 3.3 (*)    Chloride 95 (*)    Glucose, Bld 131 (*)    BUN 26 (*)    Total Bilirubin 1.5 (*)    GFR calc non Af Amer 56 (*)    All other components within normal limits  CBC WITH DIFFERENTIAL/PLATELET - Abnormal; Notable for the following components:   WBC 24.0 (*)    Platelets 106 (*)    Neutro Abs 18.3 (*)    Monocytes Absolute 4.0 (*)    Abs Immature Granulocytes 0.32 (*)    All other components within normal limits  LACTIC ACID, PLASMA - Abnormal; Notable for the following components:   Lactic Acid, Venous 2.0 (*)    All other components within normal limits  SARS CORONAVIRUS 2 (HOSPITAL ORDER, [facility])  LIPASE, BLOOD  LACTIC ACID, PLASMA  URINALYSIS, ROUTINE W REFLEX MICROSCOPIC  PATHOLOGIST SMEAR REVIEW    EKG EKG Interpretation  Date/Time:  Wednesday February 19 2019 08:46:56 EDT Ventricular Rate:  92 PR Interval:    QRS Duration: 96 QT Interval:  386 QTC Calculation: 478 R Axis:   -89 Text Interpretation:  Sinus rhythm Probable left atrial enlargement Abnormal R-wave progression, late transition Inferior infarct, old similar to prior 6/20  Confirmed by Aletta Edouard (  [id number]) on 02/19/2019 8:53:51 AM   Radiology Ct Abdomen Pelvis W Contrast  Result Date: 02/19/2019 CLINICAL DATA:  Abdominal pain in the left lower quadrant. EXAM: CT ABDOMEN AND PELVIS WITH CONTRAST TECHNIQUE: Multidetector CT imaging of the abdomen and pelvis was performed using the standard protocol following bolus administration of intravenous contrast. CONTRAST:  141mL OMNIPAQUE IOHEXOL 300 MG/ML  SOLN COMPARISON:  None. FINDINGS: Lower chest: There is bibasilar atelectasis. Vascular calcifications are seen in the coronary arteries. Hepatobiliary: No focal liver abnormality is seen. No gallstones, gallbladder wall thickening, or biliary dilatation. Pancreas: Unremarkable. No pancreatic ductal dilatation or surrounding inflammatory changes. Spleen: A crescent-shaped hypoechoic area along the medial splenic capsule measures 3.0 x 2.7 x 1.0 cm. Adrenals/Urinary Tract: Adrenal glands are unremarkable. Other than bilateral renal cysts, the kidneys are normal, without renal calculi, focal lesion, or hydronephrosis. Bladder is unremarkable. Stomach/Bowel: Stomach is within normal limits. Contrast extends to the midportion of the small bowel. The appendix is not definitely identified, however no pericecal inflammatory changes are identified to suggest acute appendicitis. There are diverticula involving the descending and sigmoid colon. There is bowel wall thickening and mild associated fat stranding of the sigmoid colon, suggestive of acute diverticulitis. There is a single locule of intraperitoneal gas seen in the anterior aspect of the left lower quadrant (series 2, image 61). There is no evidence of abscess or fistula formation. Vascular/Lymphatic: Aortic atherosclerosis. No enlarged abdominal or pelvic lymph nodes. Reproductive: The prostate is enlarged, measuring 5.3 cm in transverse dimension. Metallic material within the prostate likely represents prior urolift procedure. Other:  No abdominal wall hernia or abnormality. No free fluid is seen in the pelvis. Musculoskeletal: No fracture is seen. Degenerative changes are seen in the lumbar spine including bilateral L5 pars defects with grade 1 anterolisthesis of L5 on S1. IMPRESSION: 1. Acute diverticulosis of the descending and sigmoid colon with a tiny amount of nearby intraperitoneal gas. No evidence of abscess or fistula formation. 2. Indeterminate crescent-shaped hypoechoic area along the medial splenic capsule. This could represent prior trauma or a small amount of intraperitoneal fluid. Follow-up MR abdomen in 6 months recommended. This recommendation follows ACR consensus guidelines: White Paper of the ACR Incidental Findings Committee II on Splenic and Nodal Findings. [place] [phone]. Aortic Atherosclerosis (ICD10-I70.0). Critical Value/emergent results were called by telephone at the time of interpretation on 02/19/2019 at 12:05 pm to providerMICHAEL Demitra Danley , who verbally acknowledged these results. Electronically Signed   By: Zerita Boers M.D.   On: 02/19/2019 12:10    Procedures Procedures (including critical care time)  Medications Ordered in ED Medications  fentaNYL (SUBLIMAZE) injection 50 mcg (50 mcg Intravenous Given 02/19/19 1325)  aspirin EC tablet 81 mg (has no administration in time range)  allopurinol (ZYLOPRIM) tablet 300 mg (has no administration in time range)  atropine 1 % ophthalmic solution 1 drop (has no administration in time range)  donepezil (ARICEPT) tablet 10 mg (has no administration in time range)  dorzolamide-timolol (COSOPT) 22.3-6.8 MG/ML ophthalmic solution 1 drop (has no administration in time range)  levothyroxine (SYNTHROID) tablet 75 mcg (has no administration in time range)  ofloxacin (OCUFLOX) 0.3 % ophthalmic solution 1 drop (has no administration in time range)  prednisoLONE acetate (PRED FORTE) 1 % ophthalmic suspension 1 drop (has no administration in time range)    ranolazine (RANEXA) 12 hr tablet 500 mg (has no administration in time range)  tamsulosin (FLOMAX) capsule 0.4 mg (has no administration in time range)  enoxaparin (  LOVENOX) injection 40 mg (has no administration in time range)  sodium chloride flush (NS) 0.9 % injection 3 mL (has no administration in time range)  dextrose 5 % and 0.45 % NaCl with KCl 20 mEq/L infusion (has no administration in time range)  acetaminophen (TYLENOL) tablet 650 mg (has no administration in time range)    Or  acetaminophen (TYLENOL) suppository 650 mg (has no administration in time range)  oxyCODONE (Oxy IR/ROXICODONE) immediate release tablet 5 mg (has no administration in time range)  polyethylene glycol (MIRALAX / GLYCOLAX) packet 17 g (has no administration in time range)  ondansetron (ZOFRAN) tablet 4 mg (has no administration in time range)    Or  ondansetron (ZOFRAN) injection 4 mg (has no administration in time range)  piperacillin-tazobactam (ZOSYN) IVPB 3.375 g (has no administration in time range)  sodium chloride 0.9 % bolus 1,000 mL (0 mLs Intravenous Stopped 02/19/19 1125)  ondansetron (ZOFRAN) injection 4 mg (4 mg Intravenous Given 02/19/19 0908)  fentaNYL (SUBLIMAZE) injection 50 mcg (50 mcg Intravenous Given 02/19/19 0908)  iohexol (OMNIPAQUE) 300 MG/ML solution 100 mL (100 mLs Intravenous Contrast Given 02/19/19 1101)  piperacillin-tazobactam (ZOSYN) IVPB 3.375 g ( Intravenous Stopped 02/19/19 1248)  0.9 %  sodium chloride infusion ( Intravenous Transfusing/Transfer 02/19/19 1351)  acetaminophen (TYLENOL) tablet 650 mg (650 mg Oral Given 02/19/19 1321)     Initial Impression / Assessment and Plan / ED Course  I have reviewed the triage vital signs and the nursing notes.  Pertinent labs & imaging results that were available during my care of the patient were reviewed by me and considered in my medical decision making (see chart for details).  Clinical Course as of Feb 19 1628  Wed Feb 19, 4716   [id number] 85 year old male here with constipation low abdominal pain tremors weakness generalized.  Differential includes constipation, diverticulitis, intra-abdominal abscess, UTI, mesenteric ischemia, sepsis   [MB]  1212 Received a call from radiology reviewing the patient's CAT scan findings with me.  I also reviewed the CT independently.  He has inflammatory changes in his left lower quadrant consistent with diverticulitis.  Radiology also said he is got up may be microperforation see a small dot of air but no gross abscess noted.  He also had some fluid around the spleen that is not sure of the etiology.  I reviewed this with the patient and his daughter.  They are comfortable plan for admission on IV antibiotics.  [facility] has been contacted to talk to hospitalist for admission.   [MB]  1213 Covid screening test is been ordered.   [MB]  [id number] Discussed with Dr. Posey Pronto from the hospitalist service who accepts the patient in transfer for admission.   [MB]    Clinical Course User Index [MB] Hayden Rasmussen, MD   Patrick [name] was evaluated in Emergency Department on 02/19/2019 for the symptoms described in the history of present illness. He was evaluated in the context of the global COVID-19 pandemic, which necessitated consideration that the patient might be at risk for infection with the SARS-CoV-2 virus that causes COVID-19. Institutional protocols and algorithms that pertain to the evaluation of patients at risk for COVID-19 are in a state of rapid change based on information released by regulatory bodies including the CDC and federal and state organizations. These policies and algorithms were followed during the patient's care in the ED.      Final Clinical Impressions(s) / ED Diagnoses   Final diagnoses:  Acute diverticulitis  ED Discharge Orders    None       Hayden Rasmussen, MD 02/19/19 [id number]

## 2019-02-19 NOTE — Progress Notes (Deleted)
CARDIOLOGY OFFICE NOTE  Date:  02/19/2019    Patrick Rica H Jenkinson Date of Birth: [birth year] Medical Record [id number]  PCP:  Marton Redwood, MD  Cardiologist:  Servando Snare & ***    No chief complaint on file.   History of Present Illness: Patrick [name] is a 85 y.o. male who presents today for a *** Seen for Dr. Acie Fredrickson. He is a former patient of Dr. Susa Simmonds.   He has a known history of CAD with remote CABG from 41 (Dr. Redmond Pulling), HTN, HLD, hypothyroidism and GERD.   He was last seen by Dr. Acie Fredrickson in [place] 2020.   His wife reached out to me just before the July 4th holiday weekend - he was having marked SOB and unstable angina -had not been doing well for several weeks -he was referred for admission.His cardiac enzymes were negative for MI and his ECG was nonacute. An echocardiogram showed preserved left ventricular function with no wall motion abnormalities, butthere iscalciumon both mitral and aortic valves.   He underwent cardiac cath with Dr. Burt Knack on7/10/2018 withDES to the SVG-distal RCA. He was noted to have thrombocytopenia -withno bleeding issues. BP was elevated and his ARB was increased. He has resting bradycardia and is on beta blocker therapy- this was not changed.  His TSH was markedly low -he has had his dose of Synthroid reduced, then stopped and now back on low dose.   I have seen him as a work in a few times - he has developed marked orthostasis - not clear to me as to why - he is off most of his medicines - was placed on Florinef with some improvement. He saw Dr. Acie Fredrickson just a few weeks ago - cath films again reviewed - lots of native disease - trying to manage medically - he has been placed on Ranexa.  His wife reached out to me again last week - actually sent me pictures - he looked like he had fallen - had a left black eye - lots of bruising on his arms and legs - sleeping more - overall decline noted and they were quite concerned. He  is quite scared to die. They have put their house on the market and have bought a new home in [facility].   The patient does not have symptoms concerning for COVID-19 infection (fever, chills, cough, or new shortness of breath).   Comes in today. Here with his wife. They have just found out that he is blind in his right eye - has a detached retina - apparently happened sometime in the past - this is not new. Apparently not anything to do that will help his vision - now legally blind - no longer able to drive. His balance is poor. The vision issue is probably impacting this as well. He did admit to having a fall - 2 weeks ago - still with lots of bruising on his arms and legs. His black eye looks better. He is not really able to give details about this fall. [name] wishes he would use a walker - he is resistant to this. No chest pain. This profound fatigue continues. BP is ok. Does not seem like the Ranexa has helped but willing to stay on. Back on some thyroid medicine - due for labs and physical with Dr. Brigitte Pulse in early October. He is very tearful about his situation - wishes he was able to do more and upset that he can't.  The patient {does/does not:200015} have symptoms  concerning for COVID-19 infection (fever, chills, cough, or new shortness of breath).   Comes in today. Here with   Past Medical History:  Diagnosis Date   BPH (benign prostatic hypertrophy)    Diverticulosis    ED (erectile dysfunction)    GERD (gastroesophageal reflux disease)    Gout    Hepatitis    hx hepatitis after mono as teenager   History of kidney stones    History of skin cancer    Hyperlipidemia    Hypertension    Hypothyroidism    IHD (ischemic heart disease)    Remote CABG in 1997   MI, acute, non ST segment elevation ([place]) 2010   s/p cath with occluded SVG to PD that fills by collaterals and the remainder of his revasculsarization is satisfactory. "the first time they did the stent , the  second time they did the graft 8 vessels"   Nocturia    Thrombocytopenia ([place]) 11/19/2018   Urinary frequency     Past Surgical History:  Procedure Laterality Date   APPENDECTOMY     CARDIAC CATHETERIZATION  09/08/2008   NORMAL. EF 60%; Occluded SVG to PD that fills by collaterals and the remainder of his revascularization is satisfactory. he is managed medically.    CORONARY ARTERY BYPASS GRAFT  1997   CABG x 8   CORONARY STENT INTERVENTION N/A 11/18/2018   Procedure: CORONARY STENT INTERVENTION;  Surgeon: Sherren Mocha, MD;  Location: [facility];  Service: Cardiovascular;  Laterality: N/A;   CYSTOSCOPY WITH INSERTION OF UROLIFT N/A 03/19/2015   Procedure: CYSTOSCOPY WITH INSERTION OF FOUR UROLIFTS;  Surgeon: Carolan Clines, MD;  Location: [facility];  Service: Urology;  Laterality: N/A;   ESOPHAGOGASTRODUODENOSCOPY (EGD) WITH PROPOFOL N/A 02/12/2017   Procedure: ESOPHAGOGASTRODUODENOSCOPY (EGD) WITH PROPOFOL;  Surgeon: Otis Brace, MD;  Location: [facility];  Service: Gastroenterology;  Laterality: N/A;   ESOPHAGOGASTRODUODENOSCOPY (EGD) WITH PROPOFOL N/A 07/13/2018   Procedure: ESOPHAGOGASTRODUODENOSCOPY (EGD) WITH PROPOFOL;  Surgeon: Clarene Essex, MD;  Location: [facility];  Service: Endoscopy;  Laterality: N/A;   FOREIGN BODY REMOVAL N/A 07/13/2018   Procedure: FOREIGN BODY REMOVAL;  Surgeon: Clarene Essex, MD;  Location: [facility];  Service: Endoscopy;  Laterality: N/A;   LEFT HEART CATH AND CORS/GRAFTS ANGIOGRAPHY N/A 11/18/2018   Procedure: LEFT HEART CATH AND CORS/GRAFTS ANGIOGRAPHY;  Surgeon: Sherren Mocha, MD;  Location: [facility];  Service: Cardiovascular;  Laterality: N/A;     Medications: No outpatient medications have been marked as taking for the 02/19/19 encounter (Appointment) with Burtis Junes, NP.     Allergies: Allergies  Allergen Reactions   Halcion [Triazolam]     "Makes me crazy"   Pravachol     Unknown    Social  History: The patient  reports that he quit smoking about 60 years ago. His smoking use included cigarettes. He has a 15.00 pack-year smoking history. He has never used smokeless tobacco. He reports current alcohol use. He reports that he does not use drugs.   Family History: The patient's ***family history includes Heart failure (age of onset: 59) in his mother.   Review of Systems: Please see the history of present illness.   All other systems are reviewed and negative.   Physical Exam: VS:  There were no vitals taken for this visit. Marland Kitchen  BMI There is no height or weight on file to calculate BMI.  Wt Readings from Last 3 Encounters:  01/29/19 157 lb 1.9 oz (71.3 kg)  01/07/19  155 lb 9.6 oz (70.6 kg)  12/09/18 153 lb (69.4 kg)    General: Pleasant. Well developed, well nourished and in no acute distress.   HEENT: Normal.  Neck: Supple, no JVD, carotid bruits, or masses noted.  Cardiac: ***Regular rate and rhythm. No murmurs, rubs, or gallops. No edema.  Respiratory:  Lungs are clear to auscultation bilaterally with normal work of breathing.  GI: Soft and nontender.  MS: No deformity or atrophy. Gait and ROM intact.  Skin: Warm and dry. Color is normal.  Neuro:  Strength and sensation are intact and no gross focal deficits noted.  Psych: Alert, appropriate and with normal affect.   LABORATORY DATA:  EKG:  EKG {ACTION; IS/IS [id number] ordered today. This demonstrates ***.  Lab Results  Component Value Date   WBC 5.3 12/09/2018   HGB 14.0 12/09/2018   HCT 40.1 12/09/2018   PLT 131 (L) 12/09/2018   GLUCOSE 92 12/09/2018   CHOL 91 11/15/2018   TRIG 39 11/15/2018   HDL 42 11/15/2018   LDLCALC 41 11/15/2018   ALT 14 11/27/2018   AST 12 11/27/2018   NA 140 12/09/2018   K 4.5 12/09/2018   CL 102 12/09/2018   CREATININE 1.12 12/09/2018   BUN 16 12/09/2018   CO2 23 12/09/2018   TSH 1.020 12/09/2018   INR 1.1 09/08/2008       BNP (last 3 results) Recent Labs     11/14/18 1717  BNP 82.0    ProBNP (last 3 results) No results for input(s): PROBNP in the last 8760 hours.   Other Studies Reviewed Today:   Assessment/Plan: CARDIAC CATH: 11/18/2018 1. Severe native vessel coronary artery disease with total occlusion of the RCA, total occlusion of left circumflex, and severe stenosis leading into total occlusion of the mid LAD 2. Status post multivessel coronary bypass surgery with continued patency of the saphenous vein graft RCA, sequential saphenous vein graft to OM1 and distal circumflex, and LIMA to diagonal and mid LAD 3. Severe stenosis involving the ostium/proximal portion of the SVG to distal RCA, treated successfully with a 3.5 x 20 mm Synergy DES utilizing distal embolic protection  Recommendations: Dual antiplatelet therapy with aspirin and clopidogrel 12 months as tolerated Intervention   ECHO: 11/15/2018  1. The left ventricle has normal systolic function with an ejection fraction of 60-65%. The cavity size was normal. Left ventricular diastolic Doppler parameters are consistent with impaired relaxation. Elevated left atrial and left ventricular  end-diastolic pressures The E/e' is >15. No evidence of left ventricular regional wall motion abnormalities. 2. Left atrial size was moderately dilated. 3. The mitral valve is abnormal. Mild thickening of the mitral valve leaflet. Mild calcification of the mitral valve leaflet. There is mild to moderate mitral annular calcification present. 4. The tricuspid valve is grossly normal. 5. The aortic valve is abnormal. Moderate calcification of the aortic valve. Aortic valve regurgitation is mild to moderate by color flow Doppler. No stenosis of the aortic valve.  Assessment/Plan:  1. Unstable angina/CAD - s/p cath with recent PCI/DES to theSVG-distal RCA- sounds like his angina is now resolved.Unfortunately, his PCI has not really improved his fatigue.Has extensive diffuse native  CAD - no further options felt to be needed for revascularization - trying to manage medically and overall a difficult situation - he has been placed on Ranexa - I do not see that this has helped - he is open to trying Imdur - we will start low dose. Overall prognosis is tenuous.  2.Pre syncope/orthostasis - now on Florinef - remains off beta blocker and off ARB and off Cymbalta.  3. Profound fatigue- felt to be multifactorial - age is most certainly playing a role - in my opinion, he has not really made peace with the fact that he is older and that he has limitations - this was discussed at length.   4.Markedly abnormal TSH -now being managed by PCP  5.HTN-BP ok at home - little higher here today but he is quite upset.   6. HLD- on statin  7. Memory disorder- has been on Aricept for about a year now.  8. Legally blind now - not able to drive anymore - this certainly impacts his situation.   Marland Kitchen COVID-19 Education: The signs and symptoms of COVID-19 were discussed with the patient and how to seek care for testing (follow up with PCP or arrange E-visit).  The importance of social distancing, staying at home, hand hygiene and wearing a mask when out in public were discussed today.  Current medicines are reviewed with the patient today.  The patient does not have concerns regarding medicines other than what has been noted above.  The following changes have been made:  See above.  Labs/ tests ordered today include:   No orders of the defined types were placed in this encounter.    Disposition:   FU with *** in {gen number [id number] {Days to years:10300}.   Patient is agreeable to this plan and will call if any problems develop in the interim.   SignedTruitt Merle, NP  02/19/2019 7:37 AM  [facility] [address] [place], [place]  [address] Phone: [phone] Fax: [phone]

## 2019-02-19 NOTE — ED Notes (Signed)
ED Provider at bedside. 

## 2019-02-19 NOTE — Progress Notes (Signed)
Pharmacy Antibiotic Note  [place] H Foulkes is a 85 y.o. male admitted on 02/19/2019 with IAI.  Pharmacy has been consulted for zosyn dosing.  Plan: Zosyn 3.375g IV q8h (4 hour infusion).  Pharmacy to sign off  Height: 6' (182.9 cm) Weight: 157 lb 1.9 oz (71.3 kg) IBW/kg (Calculated) : 77.6  Temp (24hrs), Avg:97.6 F (36.4 C), Min:97.6 F (36.4 C), Max:97.6 F (36.4 C)  Recent Labs  Lab 02/19/19 0903 02/19/19 1130  WBC 24.0*  --   CREATININE 1.15  --   LATICACIDVEN 2.0* 1.6    Estimated Creatinine Clearance: 43.9 mL/min (by C-G formula based on SCr of 1.15 mg/dL).    Allergies  Allergen Reactions  . Halcion [Triazolam]     "Makes me crazy"  . Pravachol     Unknown    Thank you for allowing pharmacy to be a part of this patient's care.  Eudelia Bunch, Pharm.D [phone] 02/19/2019 4:29 PM

## 2019-02-20 DIAGNOSIS — E876 Hypokalemia: Secondary | ICD-10-CM

## 2019-02-20 DIAGNOSIS — N401 Enlarged prostate with lower urinary tract symptoms: Secondary | ICD-10-CM

## 2019-02-20 DIAGNOSIS — Z7189 Other specified counseling: Secondary | ICD-10-CM

## 2019-02-20 DIAGNOSIS — F0390 Unspecified dementia without behavioral disturbance: Secondary | ICD-10-CM

## 2019-02-20 DIAGNOSIS — D72825 Bandemia: Secondary | ICD-10-CM

## 2019-02-20 DIAGNOSIS — D696 Thrombocytopenia, unspecified: Secondary | ICD-10-CM

## 2019-02-20 LAB — URINALYSIS, ROUTINE W REFLEX MICROSCOPIC
Bacteria, UA: NONE SEEN
Bilirubin Urine: NEGATIVE
Glucose, UA: NEGATIVE mg/dL
Ketones, ur: NEGATIVE mg/dL
Leukocytes,Ua: NEGATIVE
Nitrite: NEGATIVE
Protein, ur: NEGATIVE mg/dL
Specific Gravity, Urine: 1.029 (ref 1.005–1.030)
pH: 6 (ref 5.0–8.0)

## 2019-02-20 LAB — CBC
HCT: 35.1 % — ABNORMAL LOW (ref 39.0–52.0)
Hemoglobin: 11.3 g/dL — ABNORMAL LOW (ref 13.0–17.0)
MCH: 32.9 pg (ref 26.0–34.0)
MCHC: 32.2 g/dL (ref 30.0–36.0)
MCV: 102.3 fL — ABNORMAL HIGH (ref 80.0–100.0)
Platelets: 83 10*3/uL — ABNORMAL LOW (ref 150–400)
RBC: 3.43 MIL/uL — ABNORMAL LOW (ref 4.22–5.81)
RDW: 13.9 % (ref 11.5–15.5)
WBC: 11.8 10*3/uL — ABNORMAL HIGH (ref 4.0–10.5)
nRBC: 0 % (ref 0.0–0.2)

## 2019-02-20 LAB — COMPREHENSIVE METABOLIC PANEL
ALT: 21 U/L (ref 0–44)
AST: 25 U/L (ref 15–41)
Albumin: 3.6 g/dL (ref 3.5–5.0)
Alkaline Phosphatase: 37 U/L — ABNORMAL LOW (ref 38–126)
Anion gap: 11 (ref 5–15)
BUN: 23 mg/dL (ref 8–23)
CO2: 24 mmol/L (ref 22–32)
Calcium: 7.9 mg/dL — ABNORMAL LOW (ref 8.9–10.3)
Chloride: 101 mmol/L (ref 98–111)
Creatinine, Ser: 1.31 mg/dL — ABNORMAL HIGH (ref 0.61–1.24)
GFR calc Af Amer: 56 mL/min — ABNORMAL LOW (ref >60)
GFR calc non Af Amer: 48 mL/min — ABNORMAL LOW (ref >60)
Glucose, Bld: 105 mg/dL — ABNORMAL HIGH (ref 70–99)
Potassium: 3.2 mmol/L — ABNORMAL LOW (ref 3.5–5.1)
Sodium: 136 mmol/L (ref 135–145)
Total Bilirubin: 1 mg/dL (ref 0.3–1.2)
Total Protein: 5.6 g/dL — ABNORMAL LOW (ref 6.5–8.1)

## 2019-02-20 LAB — MAGNESIUM: Magnesium: 2.1 mg/dL (ref 1.7–2.4)

## 2019-02-20 MED ORDER — LIP MEDEX EX OINT
TOPICAL_OINTMENT | CUTANEOUS | Status: AC
  Administered 2019-02-20: 11:00:00
  Filled 2019-02-20: qty 7

## 2019-02-20 MED ORDER — POLYETHYLENE GLYCOL 3350 17 G PO PACK: 17.0000 g | PACK | Freq: Two times a day (BID) | ORAL | Status: DC

## 2019-02-20 MED ORDER — POLYETHYLENE GLYCOL 3350 17 G PO PACK: 17.0000 g | PACK | Freq: Every day | ORAL | Status: DC | PRN

## 2019-02-20 MED ORDER — POTASSIUM CHLORIDE CRYS ER 20 MEQ PO TBCR
40.0000 meq | EXTENDED_RELEASE_TABLET | ORAL | Status: AC
  Administered 2019-02-20 (×2): 40 meq via ORAL
  Filled 2019-02-20: qty 2
  Filled 2019-02-20: qty 4

## 2019-02-20 MED ORDER — CLOPIDOGREL BISULFATE 75 MG PO TABS
75.0000 mg | ORAL_TABLET | Freq: Every day | ORAL | Status: DC
  Administered 2019-02-20 – 2019-02-21 (×2): 75 mg via ORAL
  Filled 2019-02-20 (×2): qty 1

## 2019-02-20 NOTE — Progress Notes (Signed)
Per patient's chart he is a full code but patient has DNR bracelet on. RN explained what the bracelet means and if he still wanted it on, he said no and bracelet was removed.

## 2019-02-20 NOTE — Progress Notes (Signed)
PT Cancellation Note  Patient Details Name: [place] H Yin MRN: [id number] DOB: 13-Oct-1926   Cancelled Treatment:    Reason Eval/Treat Not Completed: Other (comment)(pt eating lunch, requested PT try later as he hasn't eaten in 48 hours.) Will follow.   Blondell Reveal Kistler PT 02/20/2019  Acute Rehabilitation Services Pager [phone] Office [phone]

## 2019-02-20 NOTE — Progress Notes (Signed)
PT Cancellation Note  Patient Details Name: Patrick [name] MRN: [id number] DOB: 1926/09/12   Cancelled Treatment:    PT eval re-attempted but deferred - RN advises pt with ongoing diarrhea.  Will follow in am.   Rhoda Waldvogel 02/20/2019, 4:18 PM

## 2019-02-20 NOTE — Evaluation (Signed)
Occupational Therapy Evaluation Patient Details Name: Patrick [name] MRN: [id number] DOB: 04-Oct-1926 Today's Date: 02/20/2019    History of Present Illness Pt is an 85 yo male admitted for constipation, increased BP an found to have diverticulitis.  Pt recently had R retinal surgery for detached retina and had PCI in July 2020.  Pt is weak and unsteady on his feet.    Clinical Impression   Pt admitted with the above diagnosis and has the deficits listed below. Pt would benefit from continued OT to increase balance and safety with all basic adls in the framework of poor vision and to continue to complete vision related exercises so pt can feel safer and more comfortable on his feet. Pt has had balance issues since July when he was in the hospital for cardiac issues, followed by retinal surgery and now diverticulitis.  Feel pt would benefit from OPOT to address visual rehab in context of adls and to improve general strength to help his balance.      Follow Up Recommendations  Outpatient OT;Supervision/Assistance - 24 hour    Equipment Recommendations  Tub/shower seat    Recommendations for Other Services       Precautions / Restrictions Precautions Precautions: Fall Restrictions Weight Bearing Restrictions: No      Mobility Bed Mobility Overal bed mobility: Modified Independent             General bed mobility comments: Pt requires extra time and moved slow but did not require assist with bed mobility.  Transfers Overall transfer level: Needs assistance Equipment used: 1 person hand held assist Transfers: Sit to/from [facility] Sit to Stand: Min assist Stand pivot transfers: Min assist       General transfer comment: Pt unsteady on his feet.  Takes small steps.  Slightly hunched over when walking.    Balance Overall balance assessment: Needs assistance Sitting-balance support: Feet supported Sitting balance-Leahy Scale: Good     Standing  balance support: During functional activity;Bilateral upper extremity supported Standing balance-Leahy Scale: Fair Standing balance comment: Pt really requires outside support to ambulate safely.  Pt can stand for short amounts of time to groom without support.                           ADL either performed or assessed with clinical judgement   ADL Overall ADL's : Needs assistance/impaired Eating/Feeding: NPO Eating/Feeding Details (indicate cue type and reason): should physically be able to feed self Ily. Grooming: Wash/dry hands;Wash/dry face;Oral care;Supervision/safety;Standing   Upper Body Bathing: Set up;Sitting   Lower Body Bathing: Minimal assistance;Sit to/from stand Lower Body Bathing Details (indicate cue type and reason): min assist for unsteadiness on his feet. Upper Body Dressing : Set up;Sitting   Lower Body Dressing: Minimal assistance;Sit to/from stand Lower Body Dressing Details (indicate cue type and reason): assist when standing Toilet Transfer: Minimal assistance;Ambulation;RW Toilet Transfer Details (indicate cue type and reason): Pt walked to bathroom with min assist.  Toileting- Clothing Manipulation and Hygiene: Min guard;Sit to/from stand       Functional mobility during ADLs: Minimal assistance;Rolling walker General ADL Comments: Pt unsteady on his feet when up.  This appears to be from decreased vision/perception from loss of vision in R eye and from general debility.     Vision Baseline Vision/History: Retinopathy Vision Assessment?: Vision impaired- to be further tested in functional context Additional Comments: Pt is blind in the R eye which has made him feel  unsteady as he sees a black spot in this eye that wiggles and makes him feel off balance.     Perception Perception Perception Tested?: Yes   Praxis Praxis Praxis tested?: Within functional limits    Pertinent Vitals/Pain Pain Assessment: Faces Faces Pain Scale: Hurts little  more Pain Location: abdomen Pain Descriptors / Indicators: Aching Pain Intervention(s): Monitored during session;Repositioned     Hand Dominance Right   Extremity/Trunk Assessment Upper Extremity Assessment Upper Extremity Assessment: Overall WFL for tasks assessed   Lower Extremity Assessment Lower Extremity Assessment: Defer to PT evaluation   Cervical / Trunk Assessment Cervical / Trunk Assessment: Normal   Communication Communication Communication: No difficulties   Cognition Arousal/Alertness: Awake/alert Behavior During Therapy: WFL for tasks assessed/performed Overall Cognitive Status: Within Functional Limits for tasks assessed                                     General Comments  Pt overall needs an outpatient tune up.  Appears weak and frail for what is normally a very active man.  Feel his vision is affecting balance as well as his general weakness.    Exercises     Shoulder Instructions      Home Living Family/patient expects to be discharged to:: Private residence Living Arrangements: Spouse/significant other Available Help at Discharge: Family;Available 24 hours/day Type of Home: House Home Access: Stairs to enter [employer] of Steps: 4 Entrance Stairs-Rails: Right Home Layout: One level     Bathroom Shower/Tub: Walk-in shower;Door   [employer]: Standard         Additional Comments: needs to use walker or cane (defer to PT)      Prior Functioning/Environment Level of Independence: Needs assistance  Gait / Transfers Assistance Needed: was walking without assistive device b/c he does not want to use one but wife would like him to use something. ADL's / Homemaking Assistance Needed: Pt can do basic adls. Not driving due to eye surgery.  Was very active but over last few months has become weaker and does less.            OT Problem List: Decreased activity tolerance;Impaired balance (sitting and/or  standing);Impaired vision/perception;Decreased knowledge of use of DME or AE;Pain      OT Treatment/Interventions: Self-care/ADL training;Therapeutic activities    OT Goals(Current goals can be found in the care plan section) Acute Rehab OT Goals Patient Stated Goal: to be steadier on my feet OT Goal Formulation: With patient/family Time For Goal Achievement: 03/06/19 Potential to Achieve Goals: Good ADL Goals Additional ADL Goal #1: Pt will walk to bathroom with walker, toilet, groom and return to chair with mod I. Additional ADL Goal #2: Pt will walk down hallway with walker and distant supervision finding all letters of alphabet taped to walls or identifiy location signs with extra time to find them. Additional ADL Goal #3: Pt will gather all clothes in room with walker, bathe in shower with seat and dress with mod I.  OT Frequency: Min 2X/week   Barriers to D/C:            Co-evaluation              AM-PAC OT "6 Clicks" Daily Activity     Outcome Measure Help from another person eating meals?: None Help from another person taking care of personal grooming?: None Help from another person toileting, which includes using toliet,  bedpan, or urinal?: A Little Help from another person bathing (including washing, rinsing, drying)?: A Little Help from another person to put on and taking off regular upper body clothing?: None Help from another person to put on and taking off regular lower body clothing?: A Little 6 Click Score: 21   End of Session Equipment Utilized During Treatment: Rolling walker Nurse Communication: Mobility status  Activity Tolerance: Patient limited by fatigue Patient left: in bed;with call bell/phone within reach;with family/visitor present  OT Visit Diagnosis: Unsteadiness on feet (R26.81);Pain;History of falling (Z91.81) Pain - part of body: (abdomen)                Time: [id number] OT Time Calculation (min): 30 min Charges:  OT General Charges $OT  Visit: 1 Visit OT Evaluation $OT Eval Moderate Complexity: 1 Mod OT Treatments $Self Care/Home Management : 8-22 mins   Glenford Peers 02/20/2019, 1:47 PM

## 2019-02-20 NOTE — Progress Notes (Signed)
PROGRESS NOTE  Patrick [name] [id number] DOB: Dec 25, 1926   PCP: Marton Redwood, MD  Patient is from: Home.  Independent for ADLs and ambulation at baseline.  Managed on medications.  DOA: 02/19/2019 LOS: 1  Brief Narrative / Interim history: 85 year old male with history of CAD/CABG and recent PCI with stent placement in 11/2018, CKD-3, Gout, BPH,, thrombocytopenia, dementia, hypothyroidism and GERD presenting with abdominal pain, nausea, vomiting and constipation and admitted old 10/7 with acute diverticulitis with micropore.  CT abdomen and pelvis revealed acute diverticulosis of the descending and sigmoid colon with bowel wall thickening and mild associated fat stranding of sigmoid colon with a tiny amount of nearby intraperitoneal gas but no abscess or fistula formation concerning for acute diverticulitis.  Also incidental finding of indeterminate crescent-shaped hypoechoic area along the medial splenic capsule which could represent prior trauma or a small amount of intraperitoneal fluid.  Subjective: No major events overnight of this morning.  Reports improvement in his abdominal pain.  Denies nausea or vomiting this morning.  Has not had a bowel movement yet.  Denies melena or hematochezia.  Likes to try some diet.  Wife at bedside.  Objective: Vitals:   02/19/19 2014 02/20/19 0543 02/20/19 0643 02/20/19 1343  BP: (!) 120/58 (!) 110/54  (!) 145/62  Pulse: 81 74  62  Resp: 17 17  18   Temp: 99.5 F (37.5 C) 99.7 F (37.6 C)  98.9 F (37.2 C)  TempSrc: Oral Oral  Oral  SpO2: 93% 95%  98%  Weight:   71.3 kg   Height:        Intake/Output Summary (Last 24 hours) at 02/20/2019 1558 Last data filed at 02/20/2019 1400 Gross per 24 hour  Intake 1068.21 ml  Output 1350 ml  Net -281.79 ml   Filed Weights   02/19/19 1454 02/20/19 0643  Weight: 71.3 kg 71.3 kg    Examination:  GENERAL: No acute distress.  Appears well.  HEENT: MMM.  Vision and hearing grossly intact.  NECK:  Supple.  No apparent JVD.  RESP:  No IWOB. Good air movement bilaterally. CVS:  RRR. Heart sounds normal.  ABD/GI/GU: Bowel sounds present. Soft.  Mild tenderness to palpation over LLQ.  No rebound or guarding. MSK/EXT:  Moves extremities. No apparent deformity or edema.  SKIN: no apparent skin lesion or wound NEURO: Awake, alert and oriented appropriately.  No gross deficit.  PSYCH: Calm. Normal affect.   Assessment & Plan: Acute sigmoid diverticulitis with possible micropore without abscess or fistula: Pain improved.  No nausea or vomiting.  Leukocytosis improved. -Noted on CT abdomen and pelvis as above. -Continue IV Zosyn -Start clear liquid diet. -Continue IV fluid. -Needs GI follow-up in 4 to 6 weeks.  CAD/CABG/recent PCI in 11/2018: Stable.  No anginal symptoms. -Resume home Plavix. Diverticulitis improving with conservative care. -Continue other home atorvastatin and aspirin. -Resume Imdur and Ranexa once orthostatic hypotension improves.  Fluid collection around the spleen: Incidental finding of indeterminate crescent-shaped hypoechoic area along the medial splenic capsule which could represent prior trauma or a small amount of intraperitoneal fluid.  -Follow-up MR abdomen in 6 months recommended  BPH/LUTS: Reported dysuria to RN.  Also concern about hematuria also UA was negative. -Continue home Flomax -Repeat UA  Hypothyroidism -Continue home Synthroid -Recheck TSH outpatient after acute illness given recent adjustments to his Synthroid.  Orthostatic hypotension: -Continue Florinef and IV fluid -We will check orthostatic vitals -We will try TED hose if needed.  CKD-3: Renal function relatively stable. -Continue  monitoring  Constipation -MiraLAX  Essential hypertension: Continue holding Imdur and Ranexa. -We will resume as BP allows.  Hypokalemia: Likely due to IV fluid. -Replenish and recheck.  Magnesium was normal.  Thrombocytopenia: Platelets dropped from  106-83.  Likely dilutional. -Continue monitoring -SCD for VTE prophylaxis.  Gout: -Continue home allopurinol.  Dementia without behavioral disturbance: Fairly oriented. -Delirium precautions. -Continue home Aricept.  DVT prophylaxis: SCD Code Status: Full code-patient was DNR on admission but likes to be full code.  Wife at bedside in agreement. Family Communication: Patient and/or RN. Available if any question.  Disposition Plan: Remains inpatient Consultants: Cardiology over the phone by admitting provider.  Procedures:  None  Microbiology summarized: COVID-19 screen negative.  Sch Meds:  Scheduled Meds: . allopurinol  300 mg Oral Daily  . aspirin EC  81 mg Oral Daily  . atropine  1 drop Right Eye QHS  . clopidogrel  75 mg Oral Daily  . donepezil  10 mg Oral QHS  . dorzolamide-timolol  1 drop Right Eye BID  . enoxaparin (LOVENOX) injection  40 mg Subcutaneous Q24H  . levothyroxine  75 mcg Oral QAC breakfast  . ofloxacin  1 drop Right Eye QID  . prednisoLONE acetate  1 drop Right Eye QID  . ranolazine  500 mg Oral BID  . sodium chloride flush  3 mL Intravenous Q12H  . tamsulosin  0.4 mg Oral Daily   Continuous Infusions: . dextrose 5 % and 0.45 % NaCl with KCl 20 mEq/L 75 mL/hr at 02/19/19 1659  . piperacillin-tazobactam (ZOSYN)  IV 3.375 g (02/20/19 0908)   PRN Meds:.acetaminophen **OR** acetaminophen, fentaNYL (SUBLIMAZE) injection, ondansetron **OR** ondansetron (ZOFRAN) IV, oxyCODONE, polyethylene glycol  Antimicrobials: Anti-infectives (From admission, onward)   Start     Dose/Rate Route Frequency Ordered Stop   02/19/19 1800  piperacillin-tazobactam (ZOSYN) IVPB 3.375 g     3.375 g 12.5 mL/hr over 240 Minutes Intravenous Every 8 hours 02/19/19 1627     02/19/19 1215  piperacillin-tazobactam (ZOSYN) IVPB 3.375 g     3.375 g 100 mL/hr over 30 Minutes Intravenous  Once 02/19/19 1207 02/19/19 1248       I have personally reviewed the following labs and  images: CBC: Recent Labs  Lab 02/19/19 0903 02/20/19 0256  WBC 24.0* 11.8*  NEUTROABS 18.3*  --   HGB 15.2 11.3*  HCT 46.0 35.1*  MCV 100.0 102.3*  PLT 106* 83*   BMP &GFR Recent Labs  Lab 02/19/19 0903 02/20/19 0256 02/20/19 0259  NA 135 136  --   K 3.3* 3.2*  --   CL 95* 101  --   CO2 26 24  --   GLUCOSE 131* 105*  --   BUN 26* 23  --   CREATININE 1.15 1.31*  --   CALCIUM 8.9 7.9*  --   MG  --   --  2.1   Estimated Creatinine Clearance: 38.6 mL/min (A) (by C-G formula based on SCr of 1.31 mg/dL (H)). Liver & Pancreas: Recent Labs  Lab 02/19/19 0903 02/20/19 0256  AST 23 25  ALT 19 21  ALKPHOS 51 37*  BILITOT 1.5* 1.0  PROT 7.2 5.6*  ALBUMIN 4.4 3.6   Recent Labs  Lab 02/19/19 0903  LIPASE 20   No results for input(s): AMMONIA in the last 168 hours. Diabetic: No results for input(s): HGBA1C in the last 72 hours. No results for input(s): GLUCAP in the last 168 hours. Cardiac Enzymes: No results for input(s): CKTOTAL, CKMB, CKMBINDEX,  TROPONINI in the last 168 hours. No results for input(s): PROBNP in the last 8760 hours. Coagulation Profile: No results for input(s): INR, PROTIME in the last 168 hours. Thyroid Function Tests: No results for input(s): TSH, T4TOTAL, FREET4, T3FREE, THYROIDAB in the last 72 hours. Lipid Profile: No results for input(s): CHOL, HDL, LDLCALC, TRIG, CHOLHDL, LDLDIRECT in the last 72 hours. Anemia Panel: No results for input(s): VITAMINB12, FOLATE, FERRITIN, TIBC, IRON, RETICCTPCT in the last 72 hours. Urine analysis:    Component Value Date/Time   COLORURINE YELLOW 02/19/2019 1130   APPEARANCEUR CLEAR 02/19/2019 1130   LABSPEC 1.015 02/19/2019 1130   PHURINE 6.0 02/19/2019 1130   GLUCOSEU NEGATIVE 02/19/2019 1130   HGBUR NEGATIVE 02/19/2019 1130   BILIRUBINUR NEGATIVE 02/19/2019 1130   KETONESUR NEGATIVE 02/19/2019 1130   PROTEINUR NEGATIVE 02/19/2019 1130   UROBILINOGEN 1.0 03/20/2015 1813   NITRITE NEGATIVE  02/19/2019 1130   LEUKOCYTESUR NEGATIVE 02/19/2019 1130   Sepsis Labs: Invalid input(s): PROCALCITONIN, [place]  Microbiology: Recent Results (from the past 240 hour(s))  SARS Coronavirus 2 [facility] order, Performed in [facility] hospital lab) Nasopharyngeal Nasopharyngeal Swab     Status: None   Collection Time: 02/19/19 12:40 PM   Specimen: Nasopharyngeal Swab  Result Value Ref Range Status   SARS Coronavirus 2 NEGATIVE NEGATIVE Final    Comment: (NOTE) If result is NEGATIVE SARS-CoV-2 target nucleic acids are NOT DETECTED. The SARS-CoV-2 RNA is generally detectable in upper and lower  respiratory specimens during the acute phase of infection. The lowest  concentration of SARS-CoV-2 viral copies this assay can detect is 250  copies / mL. A negative result does not preclude SARS-CoV-2 infection  and should not be used as the sole basis for treatment or other  patient management decisions.  A negative result may occur with  improper specimen collection / handling, submission of specimen other  than nasopharyngeal swab, presence of viral mutation(s) within the  areas targeted by this assay, and inadequate number of viral copies  (<250 copies / mL). A negative result must be combined with clinical  observations, patient history, and epidemiological information. If result is POSITIVE SARS-CoV-2 target nucleic acids are DETECTED. The SARS-CoV-2 RNA is generally detectable in upper and lower  respiratory specimens dur ing the acute phase of infection.  Positive  results are indicative of active infection with SARS-CoV-2.  Clinical  correlation with patient history and other diagnostic information is  necessary to determine patient infection status.  Positive results do  not rule out bacterial infection or co-infection with other viruses. If result is PRESUMPTIVE POSTIVE SARS-CoV-2 nucleic acids MAY BE PRESENT.   A presumptive positive result was obtained on the submitted  specimen  and confirmed on repeat testing.  While 2019 novel coronavirus  (SARS-CoV-2) nucleic acids may be present in the submitted sample  additional confirmatory testing may be necessary for epidemiological  and / or clinical management purposes  to differentiate between  SARS-CoV-2 and other Sarbecovirus currently known to infect humans.  If clinically indicated additional testing with an alternate test  methodology [phone]) is advised. The SARS-CoV-2 RNA is generally  detectable in upper and lower respiratory sp ecimens during the acute  phase of infection. The expected result is Negative. Fact Sheet for Patients:  StrictlyIdeas.no Fact Sheet for Healthcare Providers: BankingDealers.co.za This test is not yet approved or cleared by the [place] FDA and has been authorized for detection and/or diagnosis of SARS-CoV-2 by FDA under an Emergency Use Authorization (EUA).  This  EUA will remain in effect (meaning this test can be used) for the duration of the COVID-19 declaration under Section 564(b)(1) of the Act, 21 U.S.C. section 360bbb-3(b)(1), unless the authorization is terminated or revoked sooner. Performed at [facility], [address], [place], [place] [address]     Radiology Studies: No results found.  35 minutes with more than 50% spent in reviewing records, counseling patient and coordinating care.  Abriella Filkins T. [place]  If 7PM-7AM, please contact night-coverage [email or web address] Password [email or web address] 02/20/2019, 3:58 PM

## 2019-02-20 NOTE — Progress Notes (Signed)
   02/20/19 1000  Clinical Encounter Type  Visited With Patient  Visit Type Initial;Psychological support;Spiritual support  Referral From Nurse  Consult/Referral To Chaplain  Spiritual Encounters  Spiritual Needs Emotional;Other (Comment) (Spiritual Care Conversation/Support)  Stress Factors  Patient Stress Factors Health changes;Major life changes;Other (Comment)   I visited with Patrick [name] per spiritual care consult to discuss an Advance Directive, which he declined to complete. Patrick [name] had questions about his code status, DNR. Patrick [name] states that he doesn't want to be a "vegetable," but if there is a chance to save his life he wants an attempt to be made by medical staff.  I let his nurse know his concerns about his DNR status.  Patrick [name] talked about his trips to [place] and memories of people. Patrick [name] states that he would love to be able to go back to [place] one day.   I will continue to follow up with him while he is at [facility].   Please, contact spiritual care for further assistance.   Chaplain Shanon Ace M.Div., [facility]

## 2019-02-21 DIAGNOSIS — I10 Essential (primary) hypertension: Secondary | ICD-10-CM

## 2019-02-21 DIAGNOSIS — D638 Anemia in other chronic diseases classified elsewhere: Secondary | ICD-10-CM

## 2019-02-21 DIAGNOSIS — Z951 Presence of aortocoronary bypass graft: Secondary | ICD-10-CM

## 2019-02-21 LAB — CBC
HCT: 36 % — ABNORMAL LOW (ref 39.0–52.0)
Hemoglobin: 11.9 g/dL — ABNORMAL LOW (ref 13.0–17.0)
MCH: 33.2 pg (ref 26.0–34.0)
MCHC: 33.1 g/dL (ref 30.0–36.0)
MCV: 100.6 fL — ABNORMAL HIGH (ref 80.0–100.0)
Platelets: 81 10*3/uL — ABNORMAL LOW (ref 150–400)
RBC: 3.58 MIL/uL — ABNORMAL LOW (ref 4.22–5.81)
RDW: 13.4 % (ref 11.5–15.5)
WBC: 8.4 10*3/uL (ref 4.0–10.5)
nRBC: 0 % (ref 0.0–0.2)

## 2019-02-21 LAB — BASIC METABOLIC PANEL
Anion gap: 10 (ref 5–15)
BUN: 16 mg/dL (ref 8–23)
CO2: 21 mmol/L — ABNORMAL LOW (ref 22–32)
Calcium: 8.2 mg/dL — ABNORMAL LOW (ref 8.9–10.3)
Chloride: 103 mmol/L (ref 98–111)
Creatinine, Ser: 1.11 mg/dL (ref 0.61–1.24)
GFR calc Af Amer: >60 mL/min (ref >60)
GFR calc non Af Amer: 59 mL/min — ABNORMAL LOW (ref >60)
Glucose, Bld: 112 mg/dL — ABNORMAL HIGH (ref 70–99)
Potassium: 3.7 mmol/L (ref 3.5–5.1)
Sodium: 134 mmol/L — ABNORMAL LOW (ref 135–145)

## 2019-02-21 LAB — PATHOLOGIST SMEAR REVIEW

## 2019-02-21 MED ORDER — AMOXICILLIN-POT CLAVULANATE 875-125 MG PO TABS: 1.0000 | ORAL_TABLET | Freq: Two times a day (BID) | ORAL | 0 refills | Status: DC

## 2019-02-21 MED ORDER — ISOSORBIDE MONONITRATE ER 30 MG PO TB24
15.0000 mg | ORAL_TABLET | Freq: Every day | ORAL | Status: DC
  Administered 2019-02-21: 15 mg via ORAL
  Filled 2019-02-21: qty 1

## 2019-02-21 MED ORDER — SACCHAROMYCES BOULARDII 250 MG PO CAPS: 250.0000 mg | ORAL_CAPSULE | Freq: Two times a day (BID) | ORAL | 0 refills | Status: DC

## 2019-02-21 MED ORDER — DOCUSATE SODIUM 100 MG PO CAPS: 100.0000 mg | ORAL_CAPSULE | Freq: Every day | ORAL | 2 refills | Status: AC | PRN

## 2019-02-21 MED ORDER — POLYETHYLENE GLYCOL 3350 17 G PO PACK: 17.0000 g | PACK | Freq: Every day | ORAL | 0 refills | Status: DC | PRN

## 2019-02-21 NOTE — Care Management Important Message (Signed)
Important Message  Patient Details IM Letter given to Velva Harman RN to present to the Patient Name: Patrick [name] MRN: [id number] Date of Birth: 21-Apr-1927   Medicare Important Message Given:  Yes     Kerin Salen 02/21/2019, 10:06 AM

## 2019-02-21 NOTE — Evaluation (Signed)
Physical Therapy Evaluation Patient Details Name: Patrick [name] MRN: [id number] DOB: June 07, 1926 Today's Date: 02/21/2019   History of Present Illness  Pt is an 85 yo male admitted for constipation, increased BP an found to have diverticulitis.  Pt recently had R retinal surgery for detached retina and had PCI in July 2020.  Pt is weak and unsteady on his feet.   Clinical Impression  Pt admitted with above diagnosis. Pt currently with functional limitations due to the deficits listed below (see PT Problem List). Pt will benefit from skilled PT to increase their independence and safety with mobility to allow discharge to the venue listed below.  Pt ambulated a lap around the unit with RW. Pt normally ambulates without AD and would benefit from outpatient PT and RW.     Follow Up Recommendations Outpatient PT    Equipment Recommendations  Rolling walker with 5" wheels    Recommendations for Other Services       Precautions / Restrictions Precautions Precautions: Fall Precaution Comments: decreased vision R eye Restrictions Weight Bearing Restrictions: No      Mobility  Bed Mobility Overal bed mobility: Modified Independent             General bed mobility comments: Increased time required  Transfers Overall transfer level: Needs assistance Equipment used: Rolling walker (2 wheeled) Transfers: Sit to/from Stand Sit to Stand: Min guard         General transfer comment: cues for hand placement and not to pull on RW  Ambulation/Gait Ambulation/Gait assistance: Min guard Gait Distance (Feet): 350 Feet Assistive device: Rolling walker (2 wheeled) Gait Pattern/deviations: Step-through pattern;Decreased step length - right;Decreased step length - left     General Gait Details: tends to stay towards the R of RW.  Cues for posture and positioning within RW. No LOB.  Stairs            Wheelchair Mobility    Modified Rankin (Stroke Patients Only)        Balance Overall balance assessment: Needs assistance Sitting-balance support: Feet supported Sitting balance-Leahy Scale: Good     Standing balance support: During functional activity;Bilateral upper extremity supported Standing balance-Leahy Scale: Fair Standing balance comment: Requires UE support for dynamic movement                             Pertinent Vitals/Pain Pain Assessment: Faces Faces Pain Scale: No hurt    Home Living Family/patient expects to be discharged to:: Private residence Living Arrangements: Spouse/significant other Available Help at Discharge: Family;Available 24 hours/day Type of Home: House Home Access: Stairs to enter Entrance Stairs-Rails: Right Entrance Stairs-Number of Steps: 4 Home Layout: One level        Prior Function Level of Independence: Needs assistance   Gait / Transfers Assistance Needed: was walking without assistive device b/c he does not want to use one but wife would like him to use something.  ADL's / Homemaking Assistance Needed: Pt can do basic adls. Not driving due to eye surgery.  Was very active but over last few months has become weaker and does less.        Hand Dominance   Dominant Hand: Right    Extremity/Trunk Assessment   Upper Extremity Assessment Upper Extremity Assessment: Defer to OT evaluation    Lower Extremity Assessment Lower Extremity Assessment: Overall WFL for tasks assessed    Cervical / Trunk Assessment Cervical / Trunk Assessment: Normal  Communication  Communication: No difficulties  Cognition Arousal/Alertness: Awake/alert Behavior During Therapy: WFL for tasks assessed/performed Overall Cognitive Status: Within Functional Limits for tasks assessed                                        General Comments General comments (skin integrity, edema, etc.): Pt appears weak, yet active and deconditioned to his normal state.    Exercises     Assessment/Plan     PT Assessment Patient needs continued PT services  PT Problem List Decreased strength;Decreased activity tolerance;Decreased balance;Decreased mobility;Decreased knowledge of use of DME       PT Treatment Interventions DME instruction;Gait training;Stair training;Functional mobility training;Balance training;Therapeutic exercise;Therapeutic activities;Patient/family education    PT Goals (Current goals can be found in the Care Plan section)  Acute Rehab PT Goals Patient Stated Goal: to go home PT Goal Formulation: With patient Time For Goal Achievement: 03/07/19 Potential to Achieve Goals: Good    Frequency Min 3X/week   Barriers to discharge        Co-evaluation               AM-PAC PT "6 Clicks" Mobility  Outcome Measure Help needed turning from your back to your side while in a flat bed without using bedrails?: None Help needed moving from lying on your back to sitting on the side of a flat bed without using bedrails?: None Help needed moving to and from a bed to a chair (including a wheelchair)?: A Little Help needed standing up from a chair using your arms (e.g., wheelchair or bedside chair)?: A Little Help needed to walk in hospital room?: A Little Help needed climbing 3-5 steps with a railing? : A Little 6 Click Score: 20    End of Session Equipment Utilized During Treatment: Gait belt Activity Tolerance: Patient tolerated treatment well Patient left: in chair;with call bell/phone within reach;with nursing/sitter in room Nurse Communication: Mobility status PT Visit Diagnosis: Difficulty in walking, not elsewhere classified (R26.2);Muscle weakness (generalized) (M62.81)    Time: 0913(no charge for time pt on phone)-0939 PT Time Calculation (min) (ACUTE ONLY): 26 min   Charges:   PT Evaluation $PT Eval Low Complexity: 1 Low          Gaetan Spieker L. Tamala Julian, [place] Pager [phone] 02/21/2019   Galen Manila 02/21/2019, 10:44 AM

## 2019-02-21 NOTE — TOC Progression Note (Signed)
Transition of Care [facility]) - Progression Note    Patient Details  Name: [place] H Trinidad MRN: [id number] Date of Birth: August 31, 1926  [facility]) CM/SW Contact  Leeroy Cha, RN Phone Number: 02/21/2019, 2:49 PM  Clinical Narrative:     Rolling walker ordered through [employer].       Expected Discharge Plan and Services           Expected Discharge Date: 02/21/19               DME Arranged: Gilford Rile rolling DME Agency: [employer] Date DME Agency Contacted: 02/21/19 Time DME Agency Contacted: [phone] Representative spoke with at DME Agency: Zack             Social Determinants of Health ([place]) Interventions    Readmission Risk Interventions No flowsheet data found.

## 2019-02-21 NOTE — Progress Notes (Signed)
Discharge instruction given to patient and wife. Patient or wife had no questions. NT or writer will wheel patient out

## 2019-02-21 NOTE — Discharge Summary (Signed)
Physician Discharge Summary  [place] H Werth [id number] DOB: 03-Oct-1926 DOA: 02/19/2019  PCP: Marton Redwood, MD  Admit date: 02/19/2019 Discharge date: 02/21/2019  Admitted From: Home Disposition: Home  Recommendations for Outpatient Follow-up:  1. Follow up with PCP in 1-2 weeks 2. Please obtain CBC/BMP/Mag at follow up 3. Please follow up on the following pending results: None  Home Health: Outpatient PT/OT Equipment/Devices: Rolling walker/Shower seat  Discharge Condition: Stable CODE STATUS: Full code  Hospital Course: 85 year old male with history of CAD/CABG and recent PCI with stent placement in 11/2018, CKD-3, Gout, BPH,, thrombocytopenia, dementia, hypothyroidism and GERD presenting with abdominal pain, nausea, vomiting and constipation and admitted old 10/7 with acute diverticulitis with micropore.  CT abdomen and pelvis revealed acute diverticulosis of the descending and sigmoid colon with bowel wall thickening and mild associated fat stranding of sigmoid colon with a tiny amount of nearby intraperitoneal gas but no abscess or fistula formation concerning for acute diverticulitis.  Also incidental finding of indeterminate crescent-shaped hypoechoic area along the medial splenic capsule which could represent prior trauma or a small amount of intraperitoneal fluid.  Patient was started on IV Zosyn, bowel rest and admitted.  Symptoms improved the next day.  Had a bowel movement with bowel regimen.  Started on clear liquid diet.  Tolerated soft diet prior to discharge.  Discharged on Augmentin for 10 more days.  Recommend GI referral for colonoscopy.  Evaluated by PT/OT who recommended outpatient PT/OT and DME as above.   See individual problem list below for more on hospital course.  Discharge Diagnoses:  Acute sigmoid diverticulitis with possible micropore without abscess or fistula: Abdominal pain and tenderness improved.  Nausea, vomiting and leukocytosis resolved.   Tolerated soft diet.   -Discharged on oral Augmentin for 10 more days to complete treatment course -Recommend outpatient GI referral for colonoscopy in 4 to 6 weeks.  CAD/CABG/recent PCI in 11/2018: Stable.  No anginal symptoms. -Discharged on home medications.  Fluid collection around the spleen: Incidental finding of indeterminate crescent-shaped hypoechoic area along the medial splenic capsule which could represent prior trauma or a small amount of intraperitoneal fluid.  -Follow-up MRI abdomen in 6 months recommended  BPH/LUTS: hald dysuria and hematuria once but resolved.  Initial UA negative.  Repeat UA with moderate Hgb. -Discharged on home Flomax -Recommend repeat UA in 2 to 3 weeks.  May consider urology referral if needed.  Hypothyroidism: TSH is slightly elevated -Continue home Synthroid -Recheck TSH outpatient after acute illness given recent adjustments to his Synthroid.  Orthostatic hypotension: Ambulated in the hallway without symptoms. -Continue on Florinef -TED hose -Outpatient PT/OT -DME supplies as above.  CKD-3: Renal function improved. -Continue monitoring  Constipation: Resolved. -PRN Colace and MiraLAX  Essential hypertension: BP slightly elevated. -Resumed home Imdur.  Continue on Ranexa.  Hypokalemia: Likely due to IV fluid.  Resolved. -Replenish and recheck.  Magnesium was normal.  Anemia of chronic disease/thrombocytopenia: Stable. -Recheck CBC at follow-up.  Gout: -Continue home allopurinol.  Dementia without behavioral disturbance: Fairly oriented. -Continue home Aricept.  Discharge Instructions  Discharge Instructions    Call MD for:  extreme fatigue   Complete by: As directed    Call MD for:  persistant dizziness or light-headedness   Complete by: As directed    Call MD for:  persistant nausea and vomiting   Complete by: As directed    Call MD for:  severe uncontrolled pain   Complete by: As directed    Diet - low sodium  heart healthy   Complete by: As directed    Discharge instructions   Complete by: As directed    It has been a pleasure taking care of you! You were admitted with abdominal pain, nausea and constipation likely due to acute diverticulitis (infection of your colon).  You were treated with IV antibiotics and your symptoms improved.  We are discharging you on more antibiotics to complete treatment course.  Please follow-up with your primary care doctor in 1 to 2 weeks and ask for referral to gastroenterologist for colonoscopy in 4 to 6 weeks. Please review your new medication list and the directions before you take your medications.  Take care,   Increase activity slowly   Complete by: As directed      Allergies as of 02/21/2019      Reactions   Halcion [triazolam]    "Makes me crazy"   Pravachol    Unknown      Medication List    TAKE these medications   allopurinol 300 MG tablet Commonly known as: ZYLOPRIM Take 300 mg by mouth daily.   amoxicillin-clavulanate 875-125 MG tablet Commonly known as: Augmentin Take 1 tablet by mouth 2 (two) times daily for 10 days.   aspirin 81 MG tablet Take 81 mg by mouth daily.   atorvastatin 20 MG tablet Commonly known as: LIPITOR Take 1 tablet (20 mg total) by mouth daily.   atropine 1 % ophthalmic solution Place 1 drop into the right eye daily. Does at bedtime   clopidogrel 75 MG tablet Commonly known as: PLAVIX Take 1 tablet (75 mg total) by mouth daily with breakfast.   docusate sodium 100 MG capsule Commonly known as: Colace Take 1 capsule (100 mg total) by mouth daily as needed for mild constipation.   donepezil 10 MG tablet Commonly known as: ARICEPT Take 10 mg by mouth at bedtime.   dorzolamide-timolol 22.3-6.8 MG/ML ophthalmic solution Commonly known as: COSOPT Place 1 drop into the right eye 2 (two) times daily. Does in morning and at dinner time.   fludrocortisone 0.1 MG tablet Commonly known as: FLORINEF Take 1  tablet (0.1 mg total) by mouth daily.   isosorbide mononitrate 30 MG 24 hr tablet Commonly known as: IMDUR Take 0.5 tablets (15 mg total) by mouth daily.   levothyroxine 75 MCG tablet Commonly known as: SYNTHROID Take 75 mcg by mouth daily before breakfast.   nitroGLYCERIN 0.4 MG SL tablet Commonly known as: NITROSTAT PLACE 1 TABLET (0.4 MG TOTAL) UNDER THE TONGUE EVERY 5 (FIVE) MINUTES AS NEEDED.   ofloxacin 0.3 % ophthalmic solution Commonly known as: OCUFLOX Place 1 drop into the right eye 4 (four) times daily. Do drops at breakfast,lunch,dinner & bedtime.   polyethylene glycol 17 g packet Commonly known as: MiraLax Take 17 g by mouth daily as needed for moderate constipation.   prednisoLONE acetate 1 % ophthalmic suspension Commonly known as: PRED FORTE Place 1 drop into the right eye 4 (four) times daily. Do drops at breakfast,lunch,dinner and bedtime.   ranolazine 500 MG 12 hr tablet Commonly known as: RANEXA TAKE 1 TABLET BY MOUTH TWICE A DAY   saccharomyces boulardii 250 MG capsule Commonly known as: Florastor Take 1 capsule (250 mg total) by mouth 2 (two) times daily.   tamsulosin 0.4 MG Caps capsule Commonly known as: FLOMAX Take 0.4 mg by mouth daily.   Vitamin D (Ergocalciferol) 1.25 MG (50000 UT) Caps capsule Commonly known as: DRISDOL Take 50,000 Units by mouth once a week.  Durable Medical Equipment  (From admission, onward)         Start     Ordered   02/21/19 1205  For home use only DME Walker  Once    Comments: 5" Walker  Question:  Patient needs a walker to treat with the following condition  Answer:  Unsteady gait   02/21/19 1204         Follow-up Information    Marton Redwood, MD. Schedule an appointment as soon as possible for a visit in 1 week(s).   Specialty: Internal Medicine Contact information: [place] [address] [phone]           Consultations:  None  Procedures/Studies:  2D  Echo: None  Ct Abdomen Pelvis W Contrast  Result Date: 02/19/2019 CLINICAL DATA:  Abdominal pain in the left lower quadrant. EXAM: CT ABDOMEN AND PELVIS WITH CONTRAST TECHNIQUE: Multidetector CT imaging of the abdomen and pelvis was performed using the standard protocol following bolus administration of intravenous contrast. CONTRAST:  156mL OMNIPAQUE IOHEXOL 300 MG/ML  SOLN COMPARISON:  None. FINDINGS: Lower chest: There is bibasilar atelectasis. Vascular calcifications are seen in the coronary arteries. Hepatobiliary: No focal liver abnormality is seen. No gallstones, gallbladder wall thickening, or biliary dilatation. Pancreas: Unremarkable. No pancreatic ductal dilatation or surrounding inflammatory changes. Spleen: A crescent-shaped hypoechoic area along the medial splenic capsule measures 3.0 x 2.7 x 1.0 cm. Adrenals/Urinary Tract: Adrenal glands are unremarkable. Other than bilateral renal cysts, the kidneys are normal, without renal calculi, focal lesion, or hydronephrosis. Bladder is unremarkable. Stomach/Bowel: Stomach is within normal limits. Contrast extends to the midportion of the small bowel. The appendix is not definitely identified, however no pericecal inflammatory changes are identified to suggest acute appendicitis. There are diverticula involving the descending and sigmoid colon. There is bowel wall thickening and mild associated fat stranding of the sigmoid colon, suggestive of acute diverticulitis. There is a single locule of intraperitoneal gas seen in the anterior aspect of the left lower quadrant (series 2, image 61). There is no evidence of abscess or fistula formation. Vascular/Lymphatic: Aortic atherosclerosis. No enlarged abdominal or pelvic lymph nodes. Reproductive: The prostate is enlarged, measuring 5.3 cm in transverse dimension. Metallic material within the prostate likely represents prior urolift procedure. Other: No abdominal wall hernia or abnormality. No free fluid is seen  in the pelvis. Musculoskeletal: No fracture is seen. Degenerative changes are seen in the lumbar spine including bilateral L5 pars defects with grade 1 anterolisthesis of L5 on S1. IMPRESSION: 1. Acute diverticulosis of the descending and sigmoid colon with a tiny amount of nearby intraperitoneal gas. No evidence of abscess or fistula formation. 2. Indeterminate crescent-shaped hypoechoic area along the medial splenic capsule. This could represent prior trauma or a small amount of intraperitoneal fluid. Follow-up MR abdomen in 6 months recommended. This recommendation follows ACR consensus guidelines: White Paper of the ACR Incidental Findings Committee II on Splenic and Nodal Findings. [place] [phone]. Aortic Atherosclerosis (ICD10-I70.0). Critical Value/emergent results were called by telephone at the time of interpretation on 02/19/2019 at 12:05 pm to providerMICHAEL BUTLER , who verbally acknowledged these results. Electronically Signed   By: Zerita Boers M.D.   On: 02/19/2019 12:10      Subjective: No major events overnight of this morning.  Denies abdominal pain.  Very mild tenderness.  Denies nausea, vomiting, dysuria or hematuria this morning.  Tolerated soft diet.  Ambulated with PT in the hallway without issue. Wife at bedside.  Discharge  planning discussed.  All questions answered.   Discharge Exam: Vitals:   02/21/19 0620 02/21/19 1354  BP: (!) 155/69 124/60  Pulse: 61 74  Resp: 18 18  Temp: 98.4 F (36.9 C) 98.5 F (36.9 C)  SpO2: 98% 97%    GENERAL: No acute distress.  Appears well.  HEENT: MMM.  Vision and hearing grossly intact.  NECK: Supple.  No JVD.  LUNGS:  No IWOB. Good air movement bilaterally. HEART:  RRR. Heart sounds normal.  ABD: Bowel sounds present. Soft. Non tender.  MSK/EXT:  Moves all extremities. No apparent deformity. No edema bilaterally. SKIN: no apparent skin lesion or wound NEURO: Awake, alert and oriented appropriately.  No gross  deficit.  PSYCH: Calm. Normal affect.     The results of significant diagnostics from this hospitalization (including imaging, microbiology, ancillary and laboratory) are listed below for reference.     Microbiology: Recent Results (from the past 240 hour(s))  SARS Coronavirus 2 [facility] order, Performed in [facility] hospital lab) Nasopharyngeal Nasopharyngeal Swab     Status: None   Collection Time: 02/19/19 12:40 PM   Specimen: Nasopharyngeal Swab  Result Value Ref Range Status   SARS Coronavirus 2 NEGATIVE NEGATIVE Final    Comment: (NOTE) If result is NEGATIVE SARS-CoV-2 target nucleic acids are NOT DETECTED. The SARS-CoV-2 RNA is generally detectable in upper and lower  respiratory specimens during the acute phase of infection. The lowest  concentration of SARS-CoV-2 viral copies this assay can detect is 250  copies / mL. A negative result does not preclude SARS-CoV-2 infection  and should not be used as the sole basis for treatment or other  patient management decisions.  A negative result may occur with  improper specimen collection / handling, submission of specimen other  than nasopharyngeal swab, presence of viral mutation(s) within the  areas targeted by this assay, and inadequate number of viral copies  (<250 copies / mL). A negative result must be combined with clinical  observations, patient history, and epidemiological information. If result is POSITIVE SARS-CoV-2 target nucleic acids are DETECTED. The SARS-CoV-2 RNA is generally detectable in upper and lower  respiratory specimens dur ing the acute phase of infection.  Positive  results are indicative of active infection with SARS-CoV-2.  Clinical  correlation with patient history and other diagnostic information is  necessary to determine patient infection status.  Positive results do  not rule out bacterial infection or co-infection with other viruses. If result is PRESUMPTIVE POSTIVE SARS-CoV-2 nucleic  acids MAY BE PRESENT.   A presumptive positive result was obtained on the submitted specimen  and confirmed on repeat testing.  While 2019 novel coronavirus  (SARS-CoV-2) nucleic acids may be present in the submitted sample  additional confirmatory testing may be necessary for epidemiological  and / or clinical management purposes  to differentiate between  SARS-CoV-2 and other Sarbecovirus currently known to infect humans.  If clinically indicated additional testing with an alternate test  methodology [phone]) is advised. The SARS-CoV-2 RNA is generally  detectable in upper and lower respiratory sp ecimens during the acute  phase of infection. The expected result is Negative. Fact Sheet for Patients:  StrictlyIdeas.no Fact Sheet for Healthcare Providers: BankingDealers.co.za This test is not yet approved or cleared by the [place] FDA and has been authorized for detection and/or diagnosis of SARS-CoV-2 by FDA under an Emergency Use Authorization (EUA).  This EUA will remain in effect (meaning this test can be used) for the duration of the  COVID-19 declaration under Section 564(b)(1) of the Act, 21 U.S.C. section 360bbb-3(b)(1), unless the authorization is terminated or revoked sooner. Performed at [facility], [address] [place], [place] [address]      Labs: BNP (last 3 results) Recent Labs    11/14/18 1717  BNP [id number]   Basic Metabolic Panel: Recent Labs  Lab 02/19/19 0903 02/20/19 0256 02/20/19 0259 02/21/19 0252  NA 135 136  --  134*  K 3.3* 3.2*  --  3.7  CL 95* 101  --  103  CO2 26 24  --  21*  GLUCOSE 131* 105*  --  112*  BUN 26* 23  --  16  CREATININE 1.15 1.31*  --  1.11  CALCIUM 8.9 7.9*  --  8.2*  MG  --   --  2.1  --    Liver Function Tests: Recent Labs  Lab 02/19/19 0903 02/20/19 0256  AST 23 25  ALT 19 21  ALKPHOS 51 37*  BILITOT 1.5* 1.0  PROT 7.2 5.6*  ALBUMIN 4.4 3.6    Recent Labs  Lab 02/19/19 0903  LIPASE 20   No results for input(s): AMMONIA in the last 168 hours. CBC: Recent Labs  Lab 02/19/19 0903 02/20/19 0256 02/21/19 0252  WBC 24.0* 11.8* 8.4  NEUTROABS 18.3*  --   --   HGB 15.2 11.3* 11.9*  HCT 46.0 35.1* 36.0*  MCV 100.0 102.3* 100.6*  PLT 106* 83* 81*   Cardiac Enzymes: No results for input(s): CKTOTAL, CKMB, CKMBINDEX, TROPONINI in the last 168 hours. BNP: Invalid input(s): POCBNP CBG: No results for input(s): GLUCAP in the last 168 hours. D-Dimer No results for input(s): DDIMER in the last 72 hours. Hgb A1c No results for input(s): HGBA1C in the last 72 hours. Lipid Profile No results for input(s): CHOL, HDL, LDLCALC, TRIG, CHOLHDL, LDLDIRECT in the last 72 hours. Thyroid function studies No results for input(s): TSH, T4TOTAL, T3FREE, THYROIDAB in the last 72 hours.  Invalid input(s): FREET3 Anemia work up No results for input(s): VITAMINB12, FOLATE, FERRITIN, TIBC, IRON, RETICCTPCT in the last 72 hours. Urinalysis    Component Value Date/Time   COLORURINE YELLOW 02/20/2019 1545   APPEARANCEUR CLEAR 02/20/2019 1545   LABSPEC 1.029 02/20/2019 1545   PHURINE 6.0 02/20/2019 1545   GLUCOSEU NEGATIVE 02/20/2019 1545   HGBUR MODERATE (A) 02/20/2019 1545   BILIRUBINUR NEGATIVE 02/20/2019 1545   KETONESUR NEGATIVE 02/20/2019 1545   PROTEINUR NEGATIVE 02/20/2019 1545   UROBILINOGEN 1.0 03/20/2015 1813   NITRITE NEGATIVE 02/20/2019 1545   LEUKOCYTESUR NEGATIVE 02/20/2019 1545   Sepsis Labs Invalid input(s): PROCALCITONIN,  WBC,  LACTICIDVEN   Time coordinating discharge: 35 minutes  SIGNED:  Mercy Riding, MD  [facility] 02/21/2019, 3:18 PM  If 7PM-7AM, please contact night-coverage [email or web address] Password [email or web address]

## 2019-02-21 NOTE — Progress Notes (Signed)
Occupational Therapy Treatment Patient Details Name: Patrick [name] MRN: [id number] DOB: [birth year] Today's Date: 02/21/2019    History of present illness Pt is an 85 yo male admitted for constipation, increased BP an found to have diverticulitis.  Pt recently had R retinal surgery for detached retina and had PCI in July 2020.  Pt is weak and unsteady on his feet.    OT comments  Pt. Seen for skilled OT treatment session.  Able to return demo for safe shower stall transfer min guard A.  Reviewed compensatory strategies for current visual deficits. Answered questions about DME and out patient therapy questions the wife had.  Eager for d/c home and also both pt. And spouse state they are very interested in out patient therapy for continued higher level therapies for pt.  Follow Up Recommendations  Outpatient OT;Supervision/Assistance - 24 hour    Equipment Recommendations  Tub/shower seat    Recommendations for Other Services      Precautions / Restrictions Precautions Precautions: Fall Precaution Comments: decreased vision R eye Restrictions Weight Bearing Restrictions: No       Mobility Bed Mobility Overal bed mobility: Modified Independent             General bed mobility comments: seated in chair at beginning and end of session  Transfers Overall transfer level: Needs assistance Equipment used: Rolling walker (2 wheeled) Transfers: Sit to/from Stand Sit to Stand: Min guard Stand pivot transfers: Min guard       General transfer comment: cues for hand placement and not to pull on RW    Balance Overall balance assessment: Needs assistance Sitting-balance support: Feet supported Sitting balance-Leahy Scale: Good     Standing balance support: During functional activity;Bilateral upper extremity supported Standing balance-Leahy Scale: Fair Standing balance comment: Requires UE support for dynamic movement                           ADL either  performed or assessed with clinical judgement   ADL Overall ADL's : Needs assistance/impaired                                 Tub/ Shower Transfer: Walk-in shower;Cueing for safety;Cueing for sequencing;Min guard;Rolling walker;Grab bars;Ambulation Tub/Shower Transfer Details (indicate cue type and reason): provided demo and had pt. return demo for shower stall transfer stepping backwards over ledge. Functional mobility during ADLs: Minimal assistance;Rolling walker General ADL Comments: discussed compensatory techniques for vision as pt. and spouse report he is R hand dominant and when he reaches for cup with R hand and tries to place it on counter he drops the cup. states he has been trying to use L hand more for beverages.  also reviewed scanning, turning plate or bowl to identify remaining food on plate.  discussed limiting distractions if needed as he states he has to make "cautious effort" to remember to use L hand vs. R to prevent spills     Vision       Perception     Praxis      Cognition Arousal/Alertness: Awake/alert Behavior During Therapy: WFL for tasks assessed/performed Overall Cognitive Status: Within Functional Limits for tasks assessed                                 General Comments: jokes A LOT!! wife confirms he  ALWAYS jokes and does not answer questions directly.        Exercises     Shoulder Instructions       General Comments Pt. And wife report that pt. Does not have a lot of current hobbies. (could have been joking). Mostly pt. Watches tv.  They have 3 dogs but I could not decipher if he helps feed or walk them, again due to joking.  Both did report that he LOVES to do laundry and does all of the laundry for them.  Reports having a military background.  Previous occupation involved beer distribution, owner.      Pertinent Vitals/ Pain       Pain Assessment: No/denies pain Faces Pain Scale: No hurt  Home Living  Family/patient expects to be discharged to:: Private residence Living Arrangements: Spouse/significant other Available Help at Discharge: Family;Available 24 hours/day Type of Home: House Home Access: Stairs to enter [employer] of Steps: 4 Entrance Stairs-Rails: Right Home Layout: One level     Bathroom Shower/Tub: Walk-in shower;Door   [employer]: Standard                Prior Functioning/Environment Level of Independence: Needs assistance  Gait / Transfers Assistance Needed: was walking without assistive device b/c he does not want to use one but wife would like him to use something. ADL's / Homemaking Assistance Needed: Pt can do basic adls. Not driving due to eye surgery.  Was very active but over last few months has become weaker and does less.       Frequency  Min 2X/week        Progress Toward Goals  OT Goals(current goals can now be found in the care plan section)  Progress towards OT goals: Progressing toward goals  Acute Rehab OT Goals Patient Stated Goal: to go home  Plan Discharge plan remains appropriate    Co-evaluation                 AM-PAC OT "6 Clicks" Daily Activity     Outcome Measure   Help from another person eating meals?: None Help from another person taking care of personal grooming?: None Help from another person toileting, which includes using toliet, bedpan, or urinal?: A Little Help from another person bathing (including washing, rinsing, drying)?: A Little Help from another person to put on and taking off regular upper body clothing?: None Help from another person to put on and taking off regular lower body clothing?: A Little 6 Click Score: 21    End of Session Equipment Utilized During Treatment: Rolling walker  OT Visit Diagnosis: Unsteadiness on feet (R26.81);Pain;History of falling (Z91.81)   Activity Tolerance Patient tolerated treatment well   Patient Left in chair;with call bell/phone within  reach;with family/visitor present   Nurse Communication          Time: [id number] OT Time Calculation (min): 27 min  Charges: OT General Charges $OT Visit: 1 Visit OT Treatments $Self Care/Home Management : 23-37 mins  Patrick [name], COTA/L 02/21/2019, 12:55 PM

## 2019-02-22 NOTE — TOC Transition Note (Signed)
Transition of Care [facility]) - CM/SW Discharge Note   Patient Details  Name: [place] H Milner MRN: [id number] Date of Birth: [birth year]  [facility] CM/SW Contact:  Dessa Phi, RN Phone Number: 02/22/2019, 12:45 PM   Clinical Narrative: Received call from nurse-patient didn't receive rw ordered-TC [employer] rep Keon aware to deliver rw to patients home on face sheet. No further CM needs.            Patient Goals and CMS Choice        Discharge Placement                       Discharge Plan and Services                DME Arranged: Walker rolling DME Agency: [employer] Date DME Agency Contacted: 02/21/19 Time DME Agency Contacted: [phone] Representative spoke with at DME Agency: [place] Interventions     Readmission Risk Interventions No flowsheet data found.

## 2019-02-24 ENCOUNTER — Other Ambulatory Visit: Payer: Self-pay | Admitting: *Deleted

## 2019-02-24 NOTE — Patient Outreach (Signed)
Patrick [name]  02/24/2019  Patrick [name] [birth year] [id number]    Patrick [name]  RED ON EMMI ALERT Day # 1 Date: 02/23/2019 Red Alert Reason: No [name] paperwork   Outreach attempt # 1 RN spoke with pt who granted permission to speak with his wife Patrick [name]) who completed the EMMI call. Wife states she has several concerns and wanted someone to reach out for information. Wife has verified they received paperwork however has other concerns. RN inquired if pt has spoken with any of his providers. Wife verified they have spoken with two providers including Patrick [name] (primary office) since [name].   Other inquires about compliant during the pt's hospitalization--RN provider a contact number for Patrick [name]. States a walker was suppose to be provider prior to [name] however this did not take place and pt was discharged. States she has attempted to contact [name] however unsuccessful--RN encouraged caregiver to contact the pt's provider with her request for a rolling walker.   RN attempted to assist further however caregiver very admant with her conversation however appreciative but declined further conversation (call disconnected). No other needs presented to assist with at this time.   Plan: RN CM will close this case with no additional needs.   Patrick Mina, RN [name] Coordinator [place] [facility] [phone]

## 2019-02-26 ENCOUNTER — Encounter (HOSPITAL_BASED_OUTPATIENT_CLINIC_OR_DEPARTMENT_OTHER): Payer: Self-pay

## 2019-02-26 ENCOUNTER — Inpatient Hospital Stay (HOSPITAL_BASED_OUTPATIENT_CLINIC_OR_DEPARTMENT_OTHER)
Admission: EM | Admit: 2019-02-26 | Discharge: 2019-02-28 | DRG: 378 | Disposition: A | Discharge status: Home / Self Care | Payer: Medicare Other | Attending: Internal Medicine | Admitting: Internal Medicine

## 2019-02-26 ENCOUNTER — Other Ambulatory Visit: Payer: Self-pay

## 2019-02-26 ENCOUNTER — Emergency Department (HOSPITAL_BASED_OUTPATIENT_CLINIC_OR_DEPARTMENT_OTHER): Payer: Medicare Other

## 2019-02-26 DIAGNOSIS — I2510 Atherosclerotic heart disease of native coronary artery without angina pectoris: Secondary | ICD-10-CM | POA: Diagnosis present

## 2019-02-26 DIAGNOSIS — E876 Hypokalemia: Secondary | ICD-10-CM | POA: Diagnosis present

## 2019-02-26 DIAGNOSIS — E782 Mixed hyperlipidemia: Secondary | ICD-10-CM | POA: Diagnosis not present

## 2019-02-26 DIAGNOSIS — N400 Enlarged prostate without lower urinary tract symptoms: Secondary | ICD-10-CM | POA: Diagnosis present

## 2019-02-26 DIAGNOSIS — Z9049 Acquired absence of other specified parts of digestive tract: Secondary | ICD-10-CM | POA: Diagnosis not present

## 2019-02-26 DIAGNOSIS — Z955 Presence of coronary angioplasty implant and graft: Secondary | ICD-10-CM | POA: Diagnosis not present

## 2019-02-26 DIAGNOSIS — Z20828 Contact with and (suspected) exposure to other viral communicable diseases: Secondary | ICD-10-CM | POA: Diagnosis present

## 2019-02-26 DIAGNOSIS — I080 Rheumatic disorders of both mitral and aortic valves: Secondary | ICD-10-CM | POA: Diagnosis present

## 2019-02-26 DIAGNOSIS — Z79899 Other long term (current) drug therapy: Secondary | ICD-10-CM

## 2019-02-26 DIAGNOSIS — N183 Chronic kidney disease, stage 3 unspecified: Secondary | ICD-10-CM | POA: Diagnosis present

## 2019-02-26 DIAGNOSIS — H5462 Unqualified visual loss, left eye, normal vision right eye: Secondary | ICD-10-CM | POA: Diagnosis present

## 2019-02-26 DIAGNOSIS — Z7902 Long term (current) use of antithrombotics/antiplatelets: Secondary | ICD-10-CM

## 2019-02-26 DIAGNOSIS — H3322 Serous retinal detachment, left eye: Secondary | ICD-10-CM | POA: Diagnosis present

## 2019-02-26 DIAGNOSIS — K5780 Diverticulitis of intestine, part unspecified, with perforation and abscess without bleeding: Secondary | ICD-10-CM | POA: Diagnosis present

## 2019-02-26 DIAGNOSIS — E785 Hyperlipidemia, unspecified: Secondary | ICD-10-CM | POA: Diagnosis present

## 2019-02-26 DIAGNOSIS — K5732 Diverticulitis of large intestine without perforation or abscess without bleeding: Secondary | ICD-10-CM | POA: Diagnosis not present

## 2019-02-26 DIAGNOSIS — Z7982 Long term (current) use of aspirin: Secondary | ICD-10-CM

## 2019-02-26 DIAGNOSIS — I130 Hypertensive heart and chronic kidney disease with heart failure and stage 1 through stage 4 chronic kidney disease, or unspecified chronic kidney disease: Secondary | ICD-10-CM | POA: Diagnosis present

## 2019-02-26 DIAGNOSIS — K0889 Other specified disorders of teeth and supporting structures: Secondary | ICD-10-CM | POA: Diagnosis present

## 2019-02-26 DIAGNOSIS — R195 Other fecal abnormalities: Secondary | ICD-10-CM | POA: Diagnosis not present

## 2019-02-26 DIAGNOSIS — D696 Thrombocytopenia, unspecified: Secondary | ICD-10-CM | POA: Diagnosis present

## 2019-02-26 DIAGNOSIS — Z79890 Hormone replacement therapy (postmenopausal): Secondary | ICD-10-CM

## 2019-02-26 DIAGNOSIS — Z87442 Personal history of urinary calculi: Secondary | ICD-10-CM

## 2019-02-26 DIAGNOSIS — I10 Essential (primary) hypertension: Secondary | ICD-10-CM

## 2019-02-26 DIAGNOSIS — I5020 Unspecified systolic (congestive) heart failure: Secondary | ICD-10-CM | POA: Diagnosis not present

## 2019-02-26 DIAGNOSIS — Z87891 Personal history of nicotine dependence: Secondary | ICD-10-CM

## 2019-02-26 DIAGNOSIS — K5720 Diverticulitis of large intestine with perforation and abscess without bleeding: Secondary | ICD-10-CM

## 2019-02-26 DIAGNOSIS — M109 Gout, unspecified: Secondary | ICD-10-CM | POA: Diagnosis present

## 2019-02-26 DIAGNOSIS — F0390 Unspecified dementia without behavioral disturbance: Secondary | ICD-10-CM | POA: Diagnosis present

## 2019-02-26 DIAGNOSIS — K5721 Diverticulitis of large intestine with perforation and abscess with bleeding: Principal | ICD-10-CM | POA: Diagnosis present

## 2019-02-26 DIAGNOSIS — I5032 Chronic diastolic (congestive) heart failure: Secondary | ICD-10-CM | POA: Diagnosis present

## 2019-02-26 DIAGNOSIS — Z8249 Family history of ischemic heart disease and other diseases of the circulatory system: Secondary | ICD-10-CM

## 2019-02-26 DIAGNOSIS — I951 Orthostatic hypotension: Secondary | ICD-10-CM | POA: Diagnosis present

## 2019-02-26 DIAGNOSIS — I252 Old myocardial infarction: Secondary | ICD-10-CM | POA: Diagnosis not present

## 2019-02-26 DIAGNOSIS — Z951 Presence of aortocoronary bypass graft: Secondary | ICD-10-CM | POA: Diagnosis not present

## 2019-02-26 DIAGNOSIS — Z888 Allergy status to other drugs, medicaments and biological substances status: Secondary | ICD-10-CM

## 2019-02-26 DIAGNOSIS — R109 Unspecified abdominal pain: Secondary | ICD-10-CM | POA: Diagnosis not present

## 2019-02-26 DIAGNOSIS — E039 Hypothyroidism, unspecified: Secondary | ICD-10-CM | POA: Diagnosis present

## 2019-02-26 LAB — COMPREHENSIVE METABOLIC PANEL
ALT: 21 U/L (ref 0–44)
AST: 17 U/L (ref 15–41)
Albumin: 4 g/dL (ref 3.5–5.0)
Alkaline Phosphatase: 53 U/L (ref 38–126)
Anion gap: 8 (ref 5–15)
BUN: 13 mg/dL (ref 8–23)
CO2: 27 mmol/L (ref 22–32)
Calcium: 8.8 mg/dL — ABNORMAL LOW (ref 8.9–10.3)
Chloride: 102 mmol/L (ref 98–111)
Creatinine, Ser: 1 mg/dL (ref 0.61–1.24)
GFR calc Af Amer: >60 mL/min (ref >60)
GFR calc non Af Amer: >60 mL/min (ref >60)
Glucose, Bld: 94 mg/dL (ref 70–99)
Potassium: 3.4 mmol/L — ABNORMAL LOW (ref 3.5–5.1)
Sodium: 137 mmol/L (ref 135–145)
Total Bilirubin: 0.7 mg/dL (ref 0.3–1.2)
Total Protein: 7 g/dL (ref 6.5–8.1)

## 2019-02-26 LAB — URINALYSIS, ROUTINE W REFLEX MICROSCOPIC
Bilirubin Urine: NEGATIVE
Glucose, UA: NEGATIVE mg/dL
Hgb urine dipstick: NEGATIVE
Ketones, ur: NEGATIVE mg/dL
Leukocytes,Ua: NEGATIVE
Nitrite: NEGATIVE
Protein, ur: NEGATIVE mg/dL
Specific Gravity, Urine: 1.02 (ref 1.005–1.030)
pH: 6.5 (ref 5.0–8.0)

## 2019-02-26 LAB — CBC WITH DIFFERENTIAL/PLATELET
Abs Immature Granulocytes: 0.34 10*3/uL — ABNORMAL HIGH (ref 0.00–0.07)
Basophils Absolute: 0 10*3/uL (ref 0.0–0.1)
Basophils Relative: 0 %
Eosinophils Absolute: 0 10*3/uL (ref 0.0–0.5)
Eosinophils Relative: 0 %
HCT: 40.8 % (ref 39.0–52.0)
Hemoglobin: 13.4 g/dL (ref 13.0–17.0)
Immature Granulocytes: 7 %
Lymphocytes Relative: 17 %
Lymphs Abs: 0.8 10*3/uL (ref 0.7–4.0)
MCH: 33.2 pg (ref 26.0–34.0)
MCHC: 32.8 g/dL (ref 30.0–36.0)
MCV: 101 fL — ABNORMAL HIGH (ref 80.0–100.0)
Monocytes Absolute: 1.3 10*3/uL — ABNORMAL HIGH (ref 0.1–1.0)
Monocytes Relative: 28 %
Neutro Abs: 2.3 10*3/uL (ref 1.7–7.7)
Neutrophils Relative %: 48 %
Platelets: 150 10*3/uL (ref 150–400)
RBC: 4.04 MIL/uL — ABNORMAL LOW (ref 4.22–5.81)
RDW: 13.2 % (ref 11.5–15.5)
WBC: 4.8 10*3/uL (ref 4.0–10.5)
nRBC: 0 % (ref 0.0–0.2)

## 2019-02-26 LAB — OCCULT BLOOD X 1 CARD TO LAB, STOOL: Fecal Occult Bld: POSITIVE — AB

## 2019-02-26 LAB — MAGNESIUM: Magnesium: 2 mg/dL (ref 1.7–2.4)

## 2019-02-26 LAB — SARS CORONAVIRUS 2 BY RT PCR (HOSPITAL ORDER, PERFORMED IN ~~LOC~~ HOSPITAL LAB): SARS Coronavirus 2: NEGATIVE

## 2019-02-26 MED ORDER — ASPIRIN EC 81 MG PO TBEC
81.0000 mg | DELAYED_RELEASE_TABLET | Freq: Every day | ORAL | Status: DC
  Administered 2019-02-26 – 2019-02-28 (×3): 81 mg via ORAL
  Filled 2019-02-26 (×3): qty 1

## 2019-02-26 MED ORDER — DONEPEZIL HCL 10 MG PO TABS
10.0000 mg | ORAL_TABLET | Freq: Every day | ORAL | Status: DC
  Administered 2019-02-26 – 2019-02-27 (×2): 10 mg via ORAL
  Filled 2019-02-26 (×2): qty 1

## 2019-02-26 MED ORDER — CLOPIDOGREL BISULFATE 75 MG PO TABS
75.0000 mg | ORAL_TABLET | Freq: Every day | ORAL | Status: DC
  Administered 2019-02-27 – 2019-02-28 (×2): 75 mg via ORAL
  Filled 2019-02-26 (×3): qty 1

## 2019-02-26 MED ORDER — FLUDROCORTISONE ACETATE 0.1 MG PO TABS
0.1000 mg | ORAL_TABLET | Freq: Every day | ORAL | Status: DC
  Filled 2019-02-26: qty 1

## 2019-02-26 MED ORDER — POTASSIUM CHLORIDE CRYS ER 20 MEQ PO TBCR
20.0000 meq | EXTENDED_RELEASE_TABLET | Freq: Once | ORAL | Status: AC
  Administered 2019-02-26: 20 meq via ORAL
  Filled 2019-02-26: qty 1

## 2019-02-26 MED ORDER — HYDROCODONE-ACETAMINOPHEN 5-325 MG PO TABS: 1.0000 | ORAL_TABLET | ORAL | Status: DC | PRN

## 2019-02-26 MED ORDER — ONDANSETRON HCL 4 MG/2ML IJ SOLN: 4.0000 mg | Freq: Four times a day (QID) | INTRAMUSCULAR | Status: DC | PRN

## 2019-02-26 MED ORDER — LEVOTHYROXINE SODIUM 75 MCG PO TABS
75.0000 ug | ORAL_TABLET | Freq: Every day | ORAL | Status: DC
  Administered 2019-02-27 – 2019-02-28 (×2): 75 ug via ORAL
  Filled 2019-02-26 (×2): qty 1

## 2019-02-26 MED ORDER — PIPERACILLIN-TAZOBACTAM 3.375 G IVPB
3.3750 g | Freq: Three times a day (TID) | INTRAVENOUS | Status: DC
  Administered 2019-02-27 – 2019-02-28 (×5): 3.375 g via INTRAVENOUS
  Filled 2019-02-26 (×5): qty 50

## 2019-02-26 MED ORDER — PREDNISOLONE ACETATE 1 % OP SUSP
1.0000 [drp] | Freq: Two times a day (BID) | OPHTHALMIC | Status: DC
  Administered 2019-02-27 – 2019-02-28 (×3): 1 [drp] via OPHTHALMIC
  Filled 2019-02-26: qty 5

## 2019-02-26 MED ORDER — IOHEXOL 300 MG/ML  SOLN
100.0000 mL | Freq: Once | INTRAMUSCULAR | Status: AC | PRN
  Administered 2019-02-26: 100 mL via INTRAVENOUS

## 2019-02-26 MED ORDER — MORPHINE SULFATE (PF) 2 MG/ML IV SOLN: 2.0000 mg | INTRAVENOUS | Status: DC | PRN

## 2019-02-26 MED ORDER — ATORVASTATIN CALCIUM 20 MG PO TABS
20.0000 mg | ORAL_TABLET | Freq: Every day | ORAL | Status: DC
  Administered 2019-02-26 – 2019-02-27 (×2): 20 mg via ORAL
  Filled 2019-02-26 (×2): qty 1

## 2019-02-26 MED ORDER — ACETAMINOPHEN 650 MG RE SUPP: 650.0000 mg | Freq: Four times a day (QID) | RECTAL | Status: DC | PRN

## 2019-02-26 MED ORDER — SODIUM CHLORIDE 0.9 % IV SOLN
INTRAVENOUS | Status: AC
  Administered 2019-02-26: 23:00:00 via INTRAVENOUS

## 2019-02-26 MED ORDER — PIPERACILLIN-TAZOBACTAM 3.375 G IVPB 30 MIN
3.3750 g | Freq: Once | INTRAVENOUS | Status: AC
  Administered 2019-02-26: 3.375 g via INTRAVENOUS
  Filled 2019-02-26 (×2): qty 50

## 2019-02-26 MED ORDER — DORZOLAMIDE HCL-TIMOLOL MAL 2-0.5 % OP SOLN
1.0000 [drp] | Freq: Two times a day (BID) | OPHTHALMIC | Status: DC
  Administered 2019-02-27 – 2019-02-28 (×3): 1 [drp] via OPHTHALMIC
  Filled 2019-02-26: qty 10

## 2019-02-26 MED ORDER — TAMSULOSIN HCL 0.4 MG PO CAPS
0.4000 mg | ORAL_CAPSULE | Freq: Every day | ORAL | Status: DC
  Administered 2019-02-26 – 2019-02-28 (×3): 0.4 mg via ORAL
  Filled 2019-02-26 (×3): qty 1

## 2019-02-26 MED ORDER — SODIUM CHLORIDE 0.9 % IV BOLUS
500.0000 mL | Freq: Once | INTRAVENOUS | Status: AC
  Administered 2019-02-26: 500 mL via INTRAVENOUS

## 2019-02-26 MED ORDER — ACETAMINOPHEN 325 MG PO TABS
650.0000 mg | ORAL_TABLET | Freq: Four times a day (QID) | ORAL | Status: DC | PRN
  Administered 2019-02-27: 650 mg via ORAL
  Filled 2019-02-26: qty 2

## 2019-02-26 MED ORDER — ALLOPURINOL 300 MG PO TABS
300.0000 mg | ORAL_TABLET | Freq: Every day | ORAL | Status: DC
  Administered 2019-02-26 – 2019-02-28 (×3): 300 mg via ORAL
  Filled 2019-02-26 (×3): qty 1

## 2019-02-26 MED ORDER — ONDANSETRON HCL 4 MG PO TABS: 4.0000 mg | ORAL_TABLET | Freq: Four times a day (QID) | ORAL | Status: DC | PRN

## 2019-02-26 NOTE — ED Triage Notes (Signed)
Per wife pt with cont'd abd pain, dark tarry stool-recent hosp for diverticulitis-NAD-steady gait

## 2019-02-26 NOTE — ED Notes (Signed)
[facility] notified (Kim) - patient ready for transport 

## 2019-02-26 NOTE — Progress Notes (Signed)
Pharmacy Antibiotic Note  Patrick [name] is a 85 y.o. male admitted on 02/26/2019 with intra-abdominal infection.  Pharmacy has been consulted for zosyn dosing.  Plan: Zosyn 3.375g IV q8h (4 hour infusion).  F/u scr/cultures  Height: 6' (182.9 cm) Weight: 157 lb (71.2 kg) IBW/kg (Calculated) : 77.6  Temp (24hrs), Avg:98.1 F (36.7 C), Min:97.7 F (36.5 C), Max:98.5 F (36.9 C)  Recent Labs  Lab 02/20/19 0256 02/21/19 0252 02/26/19 1441  WBC 11.8* 8.4 4.8  CREATININE 1.31* 1.11 1.00    Estimated Creatinine Clearance: 50.4 mL/min (by C-G formula based on SCr of 1 mg/dL).    Allergies  Allergen Reactions  . Halcion [Triazolam]     "Makes me crazy"  . Pravachol     Unknown    Antimicrobials this admission: 10/14 zosyn >>    >>   Dose adjustments this admission:   Microbiology results:  BCx:   UCx:    Sputum:    MRSA PCR:   Thank you for allowing pharmacy to be a part of this patient's care.  Dorrene German 02/26/2019 10:19 PM

## 2019-02-26 NOTE — H&P (Signed)
Patrick [name] [id number] DOB: 1926/12/21 DOA: 02/26/2019     PCP: Marton Redwood, MD   Outpatient Specialists:  CARDS:  Dr.Nahser   GI  Dr. Cristina Gong  Sadie Haber )    Patient arrived to ER on 02/26/19 at 1345  Patient coming from: home Lives  With family   Chief Complaint:   Chief Complaint  Patient presents with   Abdominal Pain    HPI: Patrick [name] is a 85 y.o. male with medical history significant of CAD/CABG and recent PCI with stent placement in 11/2018, CKD-3,Gout, BPH thrombocytopenia, dementia, hypothyroidism and GERD    Presented with   Worsening abdominal pain since discharge per family Recently admitted from seventh to 9 October with diverticulosis of descending and sigmoid colon with microperforation He received IV Zosyn with good improvement and was able to be discharged home on the ninth on Augmentin 10 more days GI outpatient referral for colonoscopy  Given that patient had worsening abdominal pain and overall fatigue family called GI Dr. Dr. Cristina Gong recommended to be evaluated emergency department Black tarry stools although color has improved today and he became more formed Patient had recent episode of hematuria while hospitalized since it is getting a little better Otherwise no nausea no vomiting no fever  Infectious risk factors:  Reports none   In  ER RAPID COVID TEST NEGATIVE  Lab Results  Component Value Date   [place] NEGATIVE 02/26/2019   [place] NEGATIVE 02/19/2019   [place] NEGATIVE 11/15/2018     Regarding pertinent Chronic problems:     Hyperlipidemia -  on statins lipitor 20 mg a day   HTN on isosorbide   CHF diastolic  - last echo [id number] ejection fraction of 60-65%. grade 1 DD, elevated LV filling pressure,   dementia on Aricept    CAD  - On Aspirin, statin, betablocker, Plavix                  -  followed by cardiology                - last cardiac cath PCI in 11/2018:  CARDIAC CATH: 11/18/2018 1.  Severe native vessel coronary artery disease with total occlusion of the RCA, total occlusion of left circumflex, and severe stenosis leading into total occlusion of the mid LAD 2. Status post multivessel coronary bypass surgery with continued patency of the saphenous vein graft RCA, sequential saphenous vein graft to OM1 and distal circumflex, and LIMA to diagonal and mid LAD 3. Severe stenosis involving the ostium/proximal portion of the SVG to distal RCA, treated successfully with a 3.5 x 20 mm Synergy DES utilizing distal embolic protection  Recommendations: Dual antiplatelet therapy with aspirin and clopidogrel 12 months as tolerated 3.5 x 20 mm Synergy DES utilizing distal embolic protection    Hypothyroidism:  Lab Results  Component Value Date   TSH 1.020 12/09/2018   on synthroid      BPH - on Flomax  While in ER: CT abdomen now showing 2.6 cm abscess which is new from prior General surgery has been consulted recommended admission to medicine with IV antibiotics Stool was brown no visible blood but Hemoccult positive   The following Work up has been ordered so far:  Orders Placed This Encounter  Procedures   Culture, blood (routine x 2)   SARS Coronavirus 2 by RT PCR (hospital order, performed in [facility] hospital lab) Nasopharyngeal Nasopharyngeal Swab   CT ABDOMEN PELVIS W CONTRAST   CBC  with Differential/Platelet   Comprehensive metabolic panel   Urinalysis, Routine w reflex microscopic   Occult blood card to lab, stool Provider will collect   Consult to general surgery  ALL PATIENTS BEING ADMITTED/HAVING PROCEDURES NEED COVID-19 SCREENING   Consult to hospitalist  ALL PATIENTS BEING ADMITTED/HAVING PROCEDURES NEED COVID-19 SCREENING   POC occult blood, ED Provider will collect   Admit to Inpatient (patient's expected length of stay will be greater than 2 midnights or inpatient only procedure)     Following Medications were ordered in ER: Medications    sodium chloride 0.9 % bolus 500 mL (0 mLs Intravenous Stopped 02/26/19 1535)  iohexol (OMNIPAQUE) 300 MG/ML solution 100 mL (100 mLs Intravenous Contrast Given 02/26/19 1646)  piperacillin-tazobactam (ZOSYN) IVPB 3.375 g (0 g Intravenous Stopped 02/26/19 1842)        Consult Orders  (From admission, onward)         Start     Ordered   02/26/19 1800  Consult to hospitalist  ALL PATIENTS BEING ADMITTED/HAVING PROCEDURES NEED COVID-19 SCREENING Call made via [place] , spoke with Thmas @ [phone]  Once    Comments: ALL PATIENTS BEING ADMITTED/HAVING PROCEDURES NEED COVID-19 SCREENING  Provider:  (Not yet assigned)  Question Answer Comment  Place call to: [facility]   Reason for Consult Admit      02/26/19 1800          ER Provider Called:   General surgery   Dr.Blackman  They Recommend admit to medicine   Will see in AM    Significant initial  Findings: Abnormal Labs Reviewed  CBC WITH DIFFERENTIAL/PLATELET - Abnormal; Notable for the following components:      Result Value   RBC 4.04 (*)    MCV 101.0 (*)    Monocytes Absolute 1.3 (*)    Abs Immature Granulocytes 0.34 (*)    All other components within normal limits  COMPREHENSIVE METABOLIC PANEL - Abnormal; Notable for the following components:   Potassium 3.4 (*)    Calcium 8.8 (*)    All other components within normal limits  OCCULT BLOOD X 1 CARD TO LAB, STOOL - Abnormal; Notable for the following components:   Fecal Occult Bld POSITIVE (*)    All other components within normal limits     Otherwise labs showing:    Recent Labs  Lab 02/20/19 0256 02/20/19 0259 02/21/19 0252 02/26/19 1441  NA 136  --  134* 137  K 3.2*  --  3.7 3.4*  CO2 24  --  21* 27  GLUCOSE 105*  --  112* 94  BUN 23  --  16 13  CREATININE 1.31*  --  1.11 1.00  CALCIUM 7.9*  --  8.2* 8.8*  MG  --  2.1  --   --     Cr  stable,    Lab Results  Component Value Date   CREATININE 1.00 02/26/2019   CREATININE 1.11 02/21/2019    CREATININE 1.31 (H) 02/20/2019    Recent Labs  Lab 02/20/19 0256 02/26/19 1441  AST 25 17  ALT 21 21  ALKPHOS 37* 53  BILITOT 1.0 0.7  PROT 5.6* 7.0  ALBUMIN 3.6 4.0   Lab Results  Component Value Date   CALCIUM 8.8 (L) 02/26/2019       WBC      Component Value Date/Time   WBC 4.8 02/26/2019 1441   ANC    Component Value Date/Time   NEUTROABS 2.3 02/26/2019 1441  ALC No components found for: LYMPHAB    Plt: Lab Results  Component Value Date   PLT 150 02/26/2019     Lactic Acid, Venous    Component Value Date/Time   LATICACIDVEN 1.6 02/19/2019 1130      COVID-19 Labs  No results for input(s): DDIMER, FERRITIN, LDH, CRP in the last 72 hours.  Lab Results  Component Value Date   SARSCOV2NAA NEGATIVE 02/26/2019   SARSCOV2NAA NEGATIVE 02/19/2019   [place] NEGATIVE 11/15/2018      HG/HCT stable,       Component Value Date/Time   HGB 13.4 02/26/2019 1441   HGB 14.0 12/09/2018 1534   HCT 40.8 02/26/2019 1441   HCT 40.1 12/09/2018 1534      ECG: not Ordered    BNP (last 3 results) Recent Labs    11/14/18 1717  BNP 82.0        UA  no evidence of UTI    Urine analysis:    Component Value Date/Time   COLORURINE YELLOW 02/26/2019 1630   APPEARANCEUR CLEAR 02/26/2019 1630   LABSPEC 1.020 02/26/2019 1630   PHURINE 6.5 02/26/2019 1630   GLUCOSEU NEGATIVE 02/26/2019 1630   HGBUR NEGATIVE 02/26/2019 1630   BILIRUBINUR NEGATIVE 02/26/2019 1630   KETONESUR NEGATIVE 02/26/2019 1630   PROTEINUR NEGATIVE 02/26/2019 1630   UROBILINOGEN 1.0 03/20/2015 1813   NITRITE NEGATIVE 02/26/2019 1630   LEUKOCYTESUR NEGATIVE 02/26/2019 1630      CTabd/pelvis - Mild sigmoid diverticulitis, with overall improvement of wall thickening/inflammatory changes, but now with associated 2.6 cm pericolonic fluid and gas collection, reflecting localized perforation.      ED Triage Vitals  Enc Vitals Group     BP 02/26/19 1359 (!) 185/65     Pulse Rate  02/26/19 1359 72     Resp 02/26/19 1359 18     Temp 02/26/19 1359 98.5 F (36.9 C)     Temp Source 02/26/19 1359 Oral     SpO2 02/26/19 1359 98 %     Weight 02/26/19 1358 157 lb (71.2 kg)     Height 02/26/19 1358 6' (1.829 m)     Head Circumference --      Peak Flow --      Pain Score 02/26/19 1355 1     Pain Loc --      Pain Edu? --      Excl. in [place]? --   TMAX(24)@       Latest  Blood pressure (!) 181/64, pulse (!) 56, temperature 97.7 F (36.5 C), temperature source Oral, resp. rate 18, height 6' (1.829 m), weight 71.2 kg, SpO2 100 %.     Hospitalist was called for admission for diverticulitis now with 2.6 cm abscess formation   Review of Systems:    Pertinent positives include: fatigue, tarry stools, abdominal pain,   Constitutional:  No weight loss, night sweats, Fevers, chills,  weight loss  HEENT:  No headaches, Difficulty swallowing,Tooth/dental problems,Sore throat,  No sneezing, itching, ear ache, nasal congestion, post nasal drip,  Cardio-vascular:  No chest pain, Orthopnea, PND, anasarca, dizziness, palpitations.no Bilateral lower extremity swelling  GI:  No heartburn, indigestion, nausea, vomiting, diarrhea, change in bowel habits, loss of appetite, melena, blood in stool, hematemesis Resp:  no shortness of breath at rest. No dyspnea on exertion, No excess mucus, no productive cough, No non-productive cough, No coughing up of blood.No change in color of mucus.No wheezing. Skin:  no rash or lesions. No jaundice GU:  no dysuria, change  in color of urine, no urgency or frequency. No straining to urinate.  No flank pain.  Musculoskeletal:  No joint pain or no joint swelling. No decreased range of motion. No back pain.  Psych:  No change in mood or affect. No depression or anxiety. No memory loss.  Neuro: no localizing neurological complaints, no tingling, no weakness, no double vision, no gait abnormality, no slurred speech, no confusion  All systems  reviewed and apart from [place] all are negative  Past Medical History:   Past Medical History:  Diagnosis Date   BPH (benign prostatic hypertrophy)    Diverticulosis    ED (erectile dysfunction)    GERD (gastroesophageal reflux disease)    Gout    Hepatitis    hx hepatitis after mono as teenager   History of kidney stones    History of skin cancer    Hyperlipidemia    Hypertension    Hypothyroidism    IHD (ischemic heart disease)    Remote CABG in 1997   MI, acute, non ST segment elevation ([place]) 2010   s/p cath with occluded SVG to PD that fills by collaterals and the remainder of his revasculsarization is satisfactory. "the first time they did the stent , the second time they did the graft 8 vessels"   Nocturia    Thrombocytopenia ([place]) 11/19/2018   Urinary frequency       Past Surgical History:  Procedure Laterality Date   APPENDECTOMY     CARDIAC CATHETERIZATION  09/08/2008   NORMAL. EF 60%; Occluded SVG to PD that fills by collaterals and the remainder of his revascularization is satisfactory. he is managed medically.    CORONARY ARTERY BYPASS GRAFT  1997   CABG x 8   CORONARY STENT INTERVENTION N/A 11/18/2018   Procedure: CORONARY STENT INTERVENTION;  Surgeon: Sherren Mocha, MD;  Location: [facility];  Service: Cardiovascular;  Laterality: N/A;   CYSTOSCOPY WITH INSERTION OF UROLIFT N/A 03/19/2015   Procedure: CYSTOSCOPY WITH INSERTION OF FOUR UROLIFTS;  Surgeon: Carolan Clines, MD;  Location: [facility];  Service: Urology;  Laterality: N/A;   ESOPHAGOGASTRODUODENOSCOPY (EGD) WITH PROPOFOL N/A 02/12/2017   Procedure: ESOPHAGOGASTRODUODENOSCOPY (EGD) WITH PROPOFOL;  Surgeon: Otis Brace, MD;  Location: [facility];  Service: Gastroenterology;  Laterality: N/A;   ESOPHAGOGASTRODUODENOSCOPY (EGD) WITH PROPOFOL N/A 07/13/2018   Procedure: ESOPHAGOGASTRODUODENOSCOPY (EGD) WITH PROPOFOL;  Surgeon: Clarene Essex, MD;  Location: [facility];   Service: Endoscopy;  Laterality: N/A;   FOREIGN BODY REMOVAL N/A 07/13/2018   Procedure: FOREIGN BODY REMOVAL;  Surgeon: Clarene Essex, MD;  Location: [facility];  Service: Endoscopy;  Laterality: N/A;   LEFT HEART CATH AND CORS/GRAFTS ANGIOGRAPHY N/A 11/18/2018   Procedure: LEFT HEART CATH AND CORS/GRAFTS ANGIOGRAPHY;  Surgeon: Sherren Mocha, MD;  Location: [facility];  Service: Cardiovascular;  Laterality: N/A;    Social History:  Ambulatory cane,       reports that he quit smoking about 60 years ago. His smoking use included cigarettes. He has a 15.00 pack-year smoking history. He has never used smokeless tobacco. He reports current alcohol use. He reports that he does not use drugs.   Family History:   Family History  Problem Relation Age of Onset   Heart failure Mother 54    Allergies: Allergies  Allergen Reactions   Halcion [Triazolam]     "Makes me crazy"   Pravachol     Unknown     Prior to Admission medications   Medication Sig Start  Date End Date Taking? Authorizing Provider  allopurinol (ZYLOPRIM) 300 MG tablet Take 300 mg by mouth daily.   Yes [provider]  amoxicillin-clavulanate (AUGMENTIN) 875-125 MG tablet Take 1 tablet by mouth 2 (two) times daily for 10 days. 02/21/19 03/03/19 Yes Mercy Riding, MD  aspirin 81 MG tablet Take 81 mg by mouth daily.     Yes [provider]  atorvastatin (LIPITOR) 20 MG tablet Take 1 tablet (20 mg total) by mouth daily. 02/25/18 02/26/19 Yes Daune Perch, NP  clopidogrel (PLAVIX) 75 MG tablet Take 1 tablet (75 mg total) by mouth daily with breakfast. 11/20/18  Yes Barrett, Evelene Croon, PA-C  docusate sodium (COLACE) 100 MG capsule Take 1 capsule (100 mg total) by mouth daily as needed for mild constipation. 02/21/19 05/22/19 Yes Mercy Riding, MD  donepezil (ARICEPT) 10 MG tablet Take 10 mg by mouth at bedtime.  02/13/18  Yes [provider]  dorzolamide-timolol (COSOPT) 22.3-6.8 MG/ML  ophthalmic solution Place 1 drop into the right eye 2 (two) times daily. Does in morning and at dinner time. 02/11/19  Yes [provider]  fludrocortisone (FLORINEF) 0.1 MG tablet Take 1 tablet (0.1 mg total) by mouth daily. 12/18/18  Yes Burtis Junes, NP  levothyroxine (SYNTHROID) 75 MCG tablet Take 75 mcg by mouth daily before breakfast.    Yes [provider]  nitroGLYCERIN (NITROSTAT) 0.4 MG SL tablet PLACE 1 TABLET (0.4 MG TOTAL) UNDER THE TONGUE EVERY 5 (FIVE) MINUTES AS NEEDED. 06/24/18  Yes Nahser, Wonda Cheng, MD  polyethylene glycol (MIRALAX) 17 g packet Take 17 g by mouth daily as needed for moderate constipation. 02/21/19  Yes Mercy Riding, MD  prednisoLONE acetate (PRED FORTE) 1 % ophthalmic suspension Place 1 drop into the right eye 2 (two) times daily.  01/30/19  Yes [provider]  tamsulosin (FLOMAX) 0.4 MG CAPS capsule Take 0.4 mg by mouth daily.    Yes [provider]  Vitamin D, Ergocalciferol, (DRISDOL) 50000 units CAPS capsule Take 50,000 Units by mouth once a week. 07/31/17  Yes [provider]  isosorbide mononitrate (IMDUR) 30 MG 24 hr tablet Take 0.5 tablets (15 mg total) by mouth daily. Patient not taking: Reported on 02/19/2019 01/29/19 04/29/19  Burtis Junes, NP  ranolazine (RANEXA) 500 MG 12 hr tablet TAKE 1 TABLET BY MOUTH TWICE A DAY Patient not taking: No sig reported 01/29/19   Nahser, Wonda Cheng, MD  saccharomyces boulardii (FLORASTOR) 250 MG capsule Take 1 capsule (250 mg total) by mouth 2 (two) times daily. 02/21/19   Mercy Riding, MD   Physical Exam: Blood pressure (!) 181/64, pulse (!) 56, temperature 97.7 F (36.5 C), temperature source Oral, resp. rate 18, height 6' (1.829 m), weight 71.2 kg, SpO2 100 %. 1. General:  in No   Acute distress    Chronically ill   -appearing 2. Psychological: Alert and Oriented 3. Head/ENT:    Dry Mucous Membranes                          Head Non traumatic, neck supple                           Poor Dentition 4. SKIN:  decreased Skin turgor,  Skin clean Dry and intact no rash 5. Heart: Regular rate and rhythm no Murmur, no Rub or gallop 6. Lungs:  no wheezes or crackles  7. Abdomen: Soft,   non-tender, Non distended  bowel sounds present 8. Lower extremities: no clubbing, cyanosis, no  edema 9. Neurologically Grossly intact, moving all 4 extremities equally   10. MSK: Normal range of motion   All other LABS:     Recent Labs  Lab 02/20/19 0256 02/21/19 0252 02/26/19 1441  WBC 11.8* 8.4 4.8  NEUTROABS  --   --  2.3  HGB 11.3* 11.9* 13.4  HCT 35.1* 36.0* 40.8  MCV 102.3* 100.6* 101.0*  PLT 83* 81* 150     Recent Labs  Lab 02/20/19 0256 02/20/19 0259 02/21/19 0252 02/26/19 1441  NA 136  --  134* 137  K 3.2*  --  3.7 3.4*  CL 101  --  103 102  CO2 24  --  21* 27  GLUCOSE 105*  --  112* 94  BUN 23  --  16 13  CREATININE 1.31*  --  1.11 1.00  CALCIUM 7.9*  --  8.2* 8.8*  MG  --  2.1  --   --      Recent Labs  Lab 02/20/19 0256 02/26/19 1441  AST 25 17  ALT 21 21  ALKPHOS 37* 53  BILITOT 1.0 0.7  PROT 5.6* 7.0  ALBUMIN 3.6 4.0       Cultures: No results found for: SDES, SPECREQUEST, CULT, REPTSTATUS   Radiological Exams on Admission: Ct Abdomen Pelvis W Contrast  Result Date: 02/26/2019 CLINICAL DATA:  Abdominal pain, recent diverticulitis EXAM: CT ABDOMEN AND PELVIS WITH CONTRAST TECHNIQUE: Multidetector CT imaging of the abdomen and pelvis was performed using the standard protocol following bolus administration of intravenous contrast. CONTRAST:  176mL OMNIPAQUE IOHEXOL 300 MG/ML  SOLN COMPARISON:  02/19/2019 FINDINGS: Lower chest: Lung bases are clear. Hepatobiliary: Liver is within normal limits. Gallbladder is unremarkable. No hepatic or extrahepatic duct dilatation. Pancreas: Within normal limits. Spleen: Trace posterior perisplenic fluid (series 2/image 19), unchanged. Adrenals/Urinary Tract: Adrenal glands are within normal limits.  Small bilateral renal cysts, measuring up to 17 mm in the left lower kidney. No hydronephrosis. Bladder is within normal limits. Stomach/Bowel: Stomach is notable for a small hiatal hernia. No evidence of bowel obstruction. Appendix is not discretely visualized. Extensive left colonic diverticulosis. While the proximal sigmoid colonic wall thickening/inflammatory changes are improved, there is now a 14 x 26 mm fluid and gas collection adjacent to the proximal sigmoid colon and beneath the left anterior abdominal wall (series 2/image 85), reflecting localized perforation. No free air. Vascular/Lymphatic: No evidence of abdominal aortic aneurysm. Atherosclerotic calcifications of the abdominal aorta and branch vessels. No suspicious abdominopelvic lymphadenopathy. Reproductive: Prostatomegaly. Other: No abdominopelvic ascites. Musculoskeletal: Grade 1 spondylolisthesis at L5-S1. Degenerative changes of the visualized thoracolumbar spine. IMPRESSION: Mild sigmoid diverticulitis, with overall improvement of wall thickening/inflammatory changes, but now with associated 2.6 cm pericolonic fluid and gas collection, reflecting localized perforation. No free air. Electronically Signed   By: Julian Hy M.D.   On: 02/26/2019 17:21    Chart has been reviewed    Assessment/Plan  85 y.o. male with medical history significant of CAD/CABG and recent PCI with stent placement in 11/2018, CKD-3,Gout, BPH thrombocytopenia, dementia, hypothyroidism and GERD Admitted for diverticulitis with   abscess formation  Present on Admission:  Diverticulitis of intestine with perforation and abscess without bleeding-  Patient is on Plavix and aspirin in the setting of a recent stenting Restart antibiotics as patient clinically was improving on Zosyn Will need IR consult to see if they will be  comfortable drainage Need to discuss with cardiology in the morning regarding long-term aspirin and Plavix in the setting of recent  DES with significant CAD   Coronary artery disease involving native coronary artery of native heart without angina pectoris Currently stable continue aspirin Plavix statin Will need to discuss with cardiology we will be safe to discontinue Plavix if possible for purposes of procedure   Hyperlipemia -stable continue home medications   Hypothyroidism - -    continue home medications at current dose   Essential (primary) hypertension -restart home medications   Hypokalemia -replace and check magnesium level   Occult blood positive stool -stable hemoglobin patient has recently status post DES stenting around 3 months ago on aspirin and Plavix. Will need to discuss with cardiology regarding continuation in the setting of Hemoccult positive stools but stable hemoglobin may need further procedures to investigate Tarry stools have resolved but still a bit loose 2 BM a day  History of diastolic CHF currently appears to be somewhat on the dry side will rehydrate gently  Dementia chronic continue home medications watch out for any signs of sundowning  BPH continue home medications chronic  History of orthostatic hypotension continue Florinef  Other plan as per orders.  DVT prophylaxis:  SCD     Code Status:  FULL CODE  as per patient   I had personally discussed CODE STATUS with patient    Family Communication:   Family not at  Bedside    Disposition Plan:   To home once workup is complete and patient is stable                     Would benefit from PT/OT eval prior to DC  Ordered                                       Consults called: General surgery is aware, will email Cardiology   Admission status:  ED Disposition    ED Disposition Condition [place]: [place] [[id number]]  Level of Care: Med-Surg [16]  Covid Evaluation: Asymptomatic Screening Protocol (No Symptoms)  Diagnosis: Diverticulitis of intestine with perforation and  abscess without bleeding, unspecified part of intestinal tract [id number]  Admitting Physician: Toy Baker [[id number]]  Attending Physician: Toy Baker [[id number]]  Estimated length of stay: 3 - 4 days  Certification:: I certify this patient will need inpatient services for at least 2 midnights  PT Class (Do Not Modify): Inpatient [101]  PT Acc Code (Do Not Modify): Private [1]         inpatient     Expect 2 midnight stay secondary to severity of patient's current illness including   Severe lab/radiological/exam abnormalities including: Diverticulitis with abscess   and extensive comorbidities including:  CHF  CAD  dementia     That are currently affecting medical management.   I expect  patient to be hospitalized for 2 midnights requiring inpatient medical care.  Patient is at high risk for adverse outcome (such as loss of life or disability) if not treated.  Indication for inpatient stay as follows:    inability to maintain oral hydration    Need for operative/procedural  intervention    Need for IV antibiotics, IV fluids,       Level of care  medical floor     Precautions:  NONE  No active  isolations  PPE: Used by the provider:   P100  eye Goggles,  Gloves    Patrick [name] 02/26/2019, 10:19 PM    [facility]     after 2 AM please page floor coverage PA If 7AM-7PM, please contact the day team taking care of the patient using [email or web address]

## 2019-02-26 NOTE — ED Provider Notes (Signed)
[facility]ERGENC[facility] DEPARTMENT Provider Note   CSN: [id number] Arrival date & time: 02/26/19  1345     History   Chief Complaint Chief Complaint  Patient presents with  . Abdominal Pain    HPI Patrick [name] is a 85 y.o. male.     The history is provided by the patient and the spouse.  Abdominal Pain Pain location:  Suprapubic Pain quality: cramping and shooting   Pain radiates to:  Does not radiate Pain severity:  Moderate Onset quality:  Gradual Duration:  2 weeks Timing:  Constant Progression:  Partially resolved Chronicity: persistent. Context comment:  Hx of diverticulitis with microperforation with recent hospitalization and home for 5 days Relieved by:  Not moving Worsened by:  Palpation Ineffective treatments: has been on augmentin since d/c. Associated symptoms comment:  Wife feels pt is not eating or drinking enough.  Pt states he feels like he is eating fine.  Wife states pt has had black tarry stools since the episodes and continues.  Pt states color improved today and more formed today.  When he was in the hospital he was having blood in his urine from an unknown source.  Patient states he has not noticed that in the last few days.  They spoke with their the eye doctor Dr. Cristina Gong who recommended he come to the emergency room for evaluation.  Patient denies any nausea or vomiting and no fever at this time. Risk factors: being elderly     Past Medical History:  Diagnosis Date  . BPH (benign prostatic hypertrophy)   . Diverticulosis   . ED (erectile dysfunction)   . GERD (gastroesophageal reflux disease)   . Gout   . Hepatitis    hx hepatitis after mono as teenager  . History of kidney stones   . History of skin cancer   . Hyperlipidemia   . Hypertension   . Hypothyroidism   . IHD (ischemic heart disease)    Remote CABG in 1997  . MI, acute, non ST segment elevation ([place]) 2010   s/p cath with occluded SVG to PD that fills by  collaterals and the remainder of his revasculsarization is satisfactory. "the first time they did the stent , the second time they did the graft 8 vessels"  . Nocturia   . Thrombocytopenia ([place]) 11/19/2018  . Urinary frequency     Patient Active Problem List   Diagnosis Date Noted  . Acute diverticulitis 02/19/2019  . Thrombocytopenia ([place]) 11/19/2018  . Unstable angina ([place]) 11/14/2018  . Essential (primary) hypertension 02/25/2018  . Claudication ([place]) 04/21/2016  . History of cold sores 03/22/2011  . Coronary artery disease involving native coronary artery of native heart without angina pectoris 10/28/2010  . Hyperlipemia 10/28/2010  . Hypothyroidism 10/28/2010    Past Surgical History:  Procedure Laterality Date  . APPENDECTOMY    . CARDIAC CATHETERIZATION  09/08/2008   NORMAL. EF 60%; Occluded SVG to PD that fills by collaterals and the remainder of his revascularization is satisfactory. he is managed medically.   . CORONARY ARTERY BYPASS GRAFT  1997   CABG x 8  . CORONARY STENT INTERVENTION N/A 11/18/2018   Procedure: CORONARY STENT INTERVENTION;  Surgeon: Sherren Mocha, MD;  Location: [facility];  Service: Cardiovascular;  Laterality: N/A;  . CYSTOSCOPY WITH INSERTION OF UROLIFT N/A 03/19/2015   Procedure: CYSTOSCOPY WITH INSERTION OF FOUR UROLIFTS;  Surgeon: Carolan Clines, MD;  Location: [facility];  Service: Urology;  Laterality: N/A;  .  ESOPHAGOGASTRODUODENOSCOPY (EGD) WITH PROPOFOL N/A 02/12/2017   Procedure: ESOPHAGOGASTRODUODENOSCOPY (EGD) WITH PROPOFOL;  Surgeon: Otis Brace, MD;  Location: [facility];  Service: Gastroenterology;  Laterality: N/A;  . ESOPHAGOGASTRODUODENOSCOPY (EGD) WITH PROPOFOL N/A 07/13/2018   Procedure: ESOPHAGOGASTRODUODENOSCOPY (EGD) WITH PROPOFOL;  Surgeon: Clarene Essex, MD;  Location: [facility];  Service: Endoscopy;  Laterality: N/A;  . FOREIGN BODY REMOVAL N/A 07/13/2018   Procedure: FOREIGN BODY REMOVAL;  Surgeon: Clarene Essex,  MD;  Location: [facility];  Service: Endoscopy;  Laterality: N/A;  . LEFT HEART CATH AND CORS/GRAFTS ANGIOGRAPHY N/A 11/18/2018   Procedure: LEFT HEART CATH AND CORS/GRAFTS ANGIOGRAPHY;  Surgeon: Sherren Mocha, MD;  Location: [facility];  Service: Cardiovascular;  Laterality: N/A;        Home Medications    Prior to Admission medications   Medication Sig Start Date End Date Taking? Authorizing Provider  allopurinol (ZYLOPRIM) 300 MG tablet Take 300 mg by mouth daily.    [provider]  amoxicillin-clavulanate (AUGMENTIN) 875-125 MG tablet Take 1 tablet by mouth 2 (two) times daily for 10 days. 02/21/19 03/03/19  Mercy Riding, MD  aspirin 81 MG tablet Take 81 mg by mouth daily.      [provider]  atorvastatin (LIPITOR) 20 MG tablet Take 1 tablet (20 mg total) by mouth daily. 02/25/18 02/20/19  Daune Perch, NP  atropine 1 % ophthalmic solution Place 1 drop into the right eye daily. Does at bedtime 02/11/19   [provider]  clopidogrel (PLAVIX) 75 MG tablet Take 1 tablet (75 mg total) by mouth daily with breakfast. 11/20/18   Barrett, Evelene Croon, PA-C  docusate sodium (COLACE) 100 MG capsule Take 1 capsule (100 mg total) by mouth daily as needed for mild constipation. 02/21/19 05/22/19  Mercy Riding, MD  donepezil (ARICEPT) 10 MG tablet Take 10 mg by mouth at bedtime.  02/13/18   [provider]  dorzolamide-timolol (COSOPT) 22.3-6.8 MG/ML ophthalmic solution Place 1 drop into the right eye 2 (two) times daily. Does in morning and at dinner time. 02/11/19   [provider]  fludrocortisone (FLORINEF) 0.1 MG tablet Take 1 tablet (0.1 mg total) by mouth daily. 12/18/18   Burtis Junes, NP  isosorbide mononitrate (IMDUR) 30 MG 24 hr tablet Take 0.5 tablets (15 mg total) by mouth daily. Patient not taking: Reported on 02/19/2019 01/29/19 04/29/19  Burtis Junes, NP  levothyroxine (SYNTHROID) 75 MCG tablet Take 75 mcg by mouth daily before  breakfast.     [provider]  nitroGLYCERIN (NITROSTAT) 0.4 MG SL tablet PLACE 1 TABLET (0.4 MG TOTAL) UNDER THE TONGUE EVERY 5 (FIVE) MINUTES AS NEEDED. 06/24/18   Nahser, Wonda Cheng, MD  ofloxacin (OCUFLOX) 0.3 % ophthalmic solution Place 1 drop into the right eye 4 (four) times daily. Do drops at breakfast,lunch,dinner & bedtime. 01/30/19   [provider]  polyethylene glycol (MIRALAX) 17 g packet Take 17 g by mouth daily as needed for moderate constipation. 02/21/19   Mercy Riding, MD  prednisoLONE acetate (PRED FORTE) 1 % ophthalmic suspension Place 1 drop into the right eye 4 (four) times daily. Do drops at breakfast,lunch,dinner and bedtime. 01/30/19   [provider]  ranolazine (RANEXA) 500 MG 12 hr tablet TAKE 1 TABLET BY MOUTH TWICE A DAY Patient taking differently: Take 500 mg by mouth 2 (two) times daily.  01/29/19   Nahser, Wonda Cheng, MD  saccharomyces boulardii (FLORASTOR) 250 MG capsule Take 1 capsule (250 mg total) by  mouth 2 (two) times daily. 02/21/19   Mercy Riding, MD  tamsulosin (FLOMAX) 0.4 MG CAPS capsule Take 0.4 mg by mouth daily.     [provider]  Vitamin D, Ergocalciferol, (DRISDOL) 50000 units CAPS capsule Take 50,000 Units by mouth once a week. 07/31/17   [provider]    Family History Family History  Problem Relation Age of Onset  . Heart failure Mother 82    Social History Social History   Tobacco Use  . Smoking status: Former Smoker    Packs/day: 1.00    Years: 15.00    Pack years: 15.00    Types: Cigarettes    Quit date: 05/15/1958    Years since quitting: 60.8  . Smokeless tobacco: Never Used  Substance Use Topics  . Alcohol use: Yes    Comment: occasional beer  . Drug use: No     Allergies   Halcion [triazolam] and Pravachol   Review of Systems Review of Systems  Gastrointestinal: Positive for abdominal pain.  All other systems reviewed and are negative.    Physical Exam Updated Vital  Signs BP (!) 185/65 (BP Location: Left Arm)   Pulse 72   Temp 98.5 F (36.9 C) (Oral)   Resp 18   Ht 6' (1.829 m)   Wt 71.2 kg   SpO2 98%   BMI 21.29 kg/m   Physical Exam Vitals signs and nursing note reviewed.  Constitutional:      General: He is not in acute distress.    Appearance: He is well-developed and normal weight.  HENT:     Head: Normocephalic and atraumatic.  Eyes:     Conjunctiva/sclera: Conjunctivae normal.     Pupils: Pupils are equal, round, and reactive to light.  Neck:     Musculoskeletal: Normal range of motion and neck supple.  Cardiovascular:     Rate and Rhythm: Normal rate and regular rhythm.     Heart sounds: No murmur.  Pulmonary:     Effort: Pulmonary effort is normal. No respiratory distress.     Breath sounds: Normal breath sounds. No wheezing or rales.  Abdominal:     General: There is no distension.     Palpations: Abdomen is soft.     Tenderness: There is abdominal tenderness in the suprapubic area. There is no guarding or rebound.  Genitourinary:    Comments: Stool is light brown and no visible blood Musculoskeletal: Normal range of motion.        General: No tenderness.  Skin:    General: Skin is warm and dry.     Findings: No erythema or rash.  Neurological:     Mental Status: He is alert and oriented to person, place, and time.  Psychiatric:        Behavior: Behavior normal.      ED Treatments / Results  Labs (all labs ordered are listed, but only abnormal results are displayed) Labs Reviewed  OCCULT BLOOD X 1 CARD TO LAB, STOOL - Abnormal; Notable for the following components:      Result Value   Fecal Occult Bld POSITIVE (*)    All other components within normal limits  CBC WITH DIFFERENTIAL/PLATELET  COMPREHENSIVE METABOLIC PANEL  URINALYSIS, ROUTINE W REFLEX MICROSCOPIC  POC OCCULT BLOOD, ED    EKG None  Radiology No results found.  Procedures Procedures (including critical care time)  Medications Ordered  in ED Medications  sodium chloride 0.9 % bolus 500 mL (has no administration in  time range)     Initial Impression / Assessment and Plan / ED Course  I have reviewed the triage vital signs and the nursing notes.  Pertinent labs & imaging results that were available during my care of the patient were reviewed by me and considered in my medical decision making (see chart for details).       Elderly male with recent admission to the hospital for diverticulitis with microperforation who has now been home for 5 days and currently still taking Augmentin presenting today for a recheck.  Wife states that since patient's had the diverticulitis he has had black tarry stools and those have continued and she does not feel like he is eating or drinking enough and is just generally weak.  Patient states the stool color has improved today the pain in his abdomen is still improving and he denies any nausea.  He states he does not feel like drinking but feels like he is eating adequately.  On exam patient is well-appearing with normal vital signs except for hypertension.  He was able to ambulate into the emergency room without difficulty.  Hemoccult is pending but stool color was brown.  Will recheck labs, repeat CT and go from there.  Hemoccult is positive.  Final Clinical Impressions(s) / ED Diagnoses   Final diagnoses:  None    ED Discharge Orders    None       Blanchie Dessert, MD 03/08/19 [phone]

## 2019-02-27 ENCOUNTER — Encounter (HOSPITAL_COMMUNITY): Payer: Self-pay

## 2019-02-27 DIAGNOSIS — I951 Orthostatic hypotension: Secondary | ICD-10-CM

## 2019-02-27 LAB — CBC
HCT: 36.5 % — ABNORMAL LOW (ref 39.0–52.0)
Hemoglobin: 12.2 g/dL — ABNORMAL LOW (ref 13.0–17.0)
MCH: 33.2 pg (ref 26.0–34.0)
MCHC: 33.4 g/dL (ref 30.0–36.0)
MCV: 99.2 fL (ref 80.0–100.0)
Platelets: 146 10*3/uL — ABNORMAL LOW (ref 150–400)
RBC: 3.68 MIL/uL — ABNORMAL LOW (ref 4.22–5.81)
RDW: 13.3 % (ref 11.5–15.5)
WBC: 4.8 10*3/uL (ref 4.0–10.5)
nRBC: 0 % (ref 0.0–0.2)

## 2019-02-27 LAB — COMPREHENSIVE METABOLIC PANEL
ALT: 20 U/L (ref 0–44)
AST: 18 U/L (ref 15–41)
Albumin: 3.4 g/dL — ABNORMAL LOW (ref 3.5–5.0)
Alkaline Phosphatase: 44 U/L (ref 38–126)
Anion gap: 11 (ref 5–15)
BUN: 9 mg/dL (ref 8–23)
CO2: 22 mmol/L (ref 22–32)
Calcium: 8.2 mg/dL — ABNORMAL LOW (ref 8.9–10.3)
Chloride: 102 mmol/L (ref 98–111)
Creatinine, Ser: 0.94 mg/dL (ref 0.61–1.24)
GFR calc Af Amer: >60 mL/min (ref >60)
GFR calc non Af Amer: >60 mL/min (ref >60)
Glucose, Bld: 101 mg/dL — ABNORMAL HIGH (ref 70–99)
Potassium: 3.5 mmol/L (ref 3.5–5.1)
Sodium: 135 mmol/L (ref 135–145)
Total Bilirubin: 0.6 mg/dL (ref 0.3–1.2)
Total Protein: 6 g/dL — ABNORMAL LOW (ref 6.5–8.1)

## 2019-02-27 LAB — MAGNESIUM: Magnesium: 2 mg/dL (ref 1.7–2.4)

## 2019-02-27 LAB — TSH: TSH: 23.744 u[IU]/mL — ABNORMAL HIGH (ref 0.350–4.500)

## 2019-02-27 LAB — PHOSPHORUS: Phosphorus: 3 mg/dL (ref 2.5–4.6)

## 2019-02-27 MED ORDER — ISOSORBIDE MONONITRATE ER 30 MG PO TB24: 15.0000 mg | ORAL_TABLET | Freq: Every day | ORAL | Status: DC

## 2019-02-27 MED ORDER — HYDRALAZINE HCL 20 MG/ML IJ SOLN: 10.0000 mg | Freq: Four times a day (QID) | INTRAMUSCULAR | Status: DC | PRN

## 2019-02-27 MED ORDER — VERAPAMIL HCL 40 MG PO TABS
40.0000 mg | ORAL_TABLET | Freq: Three times a day (TID) | ORAL | Status: DC | PRN
  Administered 2019-02-28: 40 mg via ORAL
  Filled 2019-02-27 (×2): qty 1

## 2019-02-27 NOTE — Progress Notes (Signed)
OT Cancellation Note  Patient Details Name: Patrick [name] MRN: [id number] DOB: [birth year]   Cancelled Treatment:    Reason Eval/Treat Not Completed: Patient at procedure or test/ unavailable. Pt currently working with PT. Plan to reattempt in a bit.  Tyrone Schimke, OT Acute Rehabilitation Services Pager: [phone] Office: [phone]  02/27/2019, 10:48 AM

## 2019-02-27 NOTE — Progress Notes (Signed)
Pt BP is 168/78, checked manually its 164/74. Pt denies pain. NP Schorr is notified.

## 2019-02-27 NOTE — Progress Notes (Signed)
IR requested by Dr. Roel Cluck for possible image-guided percutaneous aspiration/drain placement of pericolonic fluid/gas collection.  CT abdomen/pelvis 02/26/2019 reviewed by Dr. Kathlene Cote who states fluid collection is too small (2.0-2.1 cm) and not amendable to percutaneous drainage at this time. Recommend IV antibiotics and F/U CT. Will delete order. Dr. Pietro Cassis made aware.  IR available in future if needed.   Bea Graff Chistina Roston, PA-C 02/27/2019, 9:12 AM

## 2019-02-27 NOTE — Consult Note (Signed)
Cardiology Consultation:   Patient ID: Patrick Rica H Kempton; [id number]; June 19, 1926   Admit date: 02/26/2019 Date of Consult: 02/27/2019  Primary Care Provider: Marton Redwood, MD Primary Cardiologist: Mertie Moores, MD Primary Electrophysiologist:  None   Patient Profile:   Patrick [name] is a 85 y.o. male with a PMH of CAD s/p CABG in 1997 with subsequent PCI/DES to SVG-RCA 11/2018, HTN, HLD, gout, hypothyroidism, GERD, BPH, thrombocytopenia, and dementia, who is being seen today for the evaluation of medication recommendations for patient with GI bleed on DAPT at the request of Dr. Roel Cluck.  History of Present Illness:   Patrick [name] was admitted to the hospital 02/19/2019-02/21/2019 where he was found to have acute diverticulitis with microperforation of the sigmoid colon. He was managed conservatively with bowel rest and IV antibiotics, and discharged home on po antibiotics for an additional 10 days. Unfortunately patient experienced black tarry stools and was recommended to present to the ED again by his GI doctor. Patient presented to [facility]/26/2019, found to have hemoccult positive stools, and was transferred to [facility] for ongoing care. CT A/P showed a mild sigmoid diverticulitis with overall improvement, though revealed a 2.6cmpericolonic fluid/gas collection c/f abscess and localized perforation. IV antibiotics restarted. IR consulted for possible drainage of abscess. General surgery consulted for possible surgical intervention. Cardiology asked to weigh in on DAPT regimen in patient with GI bleed.   He was last evaluated by cardiology as an outpatient visit with Truitt Merle, NP 01/29/2019, at which time he had no cardiac complaints but was reported to be tearful regarding his functional decline. 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 Also with ongoing fatigue, weakness and unsteady gait resulting  in a fall 2 weeks prior with significant bruising to eye, arms, and legs. He denied any significant improvement with addition of ranexa and was transitioned to imdur. Recent issues with orthostatic hypotension on florinef which was subsequently discontinued due to significant hypertension. His last ischemic evaluation was a LHC 11/2018 for unstable angina which revealed severe native vessel CAD with patent SVG-OM1/LCx and LIMA-LAD, though severe stenosis of proximal SVG-RCA managed with PCI/DES; recommended for DAPT with aspirin and plavix x12 months. Echo 11/2018 with EF 60-65%, G1DD, moderate LAE, mild-moderate AI, and mild mitral valve calcifications.   Over the past month he feels like things have been stable from a cardiac standpoint. Still struggling with easy fatigability. No complaints of chest pain or SOB. No complaints of dizziness, lightheadedness, or syncope. No falls in the past month. Called patient's wife [name] to confirm medications. Patient did not start imdur after his last visit with Cecille Rubin 01/29/2019 as he was already experiencing headaches with his eye trouble and did not want to exacerbate his symptoms. He is not taking ranexa at this time either. He continues to take florinef.  Past Medical History:  Diagnosis Date  . BPH (benign prostatic hypertrophy)   . Diverticulosis   . ED (erectile dysfunction)   . GERD (gastroesophageal reflux disease)   . Gout   . Hepatitis    hx hepatitis after mono as teenager  . History of kidney stones   . History of skin cancer   . Hyperlipidemia   . Hypertension   . Hypothyroidism   . IHD (ischemic heart disease)    Remote CABG in 1997  . MI, acute, non ST segment elevation ([place]) 2010   s/p cath with occluded SVG to PD  that fills by collaterals and the remainder of his revasculsarization is satisfactory. "the first time they did the stent , the second time they did the graft 8 vessels"  . Nocturia   . Thrombocytopenia ([place]) 11/19/2018  .  Urinary frequency     Past Surgical History:  Procedure Laterality Date  . APPENDECTOMY    . CARDIAC CATHETERIZATION  09/08/2008   NORMAL. EF 60%; Occluded SVG to PD that fills by collaterals and the remainder of his revascularization is satisfactory. he is managed medically.   . CORONARY ARTERY BYPASS GRAFT  1997   CABG x 8  . CORONARY STENT INTERVENTION N/A 11/18/2018   Procedure: CORONARY STENT INTERVENTION;  Surgeon: Sherren Mocha, MD;  Location: [place] [facility];  Service: Cardiovascular;  Laterality: N/A;  . CYSTOSCOPY WITH INSERTION OF UROLIFT N/A 03/19/2015   Procedure: CYSTOSCOPY WITH INSERTION OF FOUR UROLIFTS;  Surgeon: Carolan Clines, MD;  Location: [facility];  Service: Urology;  Laterality: N/A;  . ESOPHAGOGASTRODUODENOSCOPY (EGD) WITH PROPOFOL N/A 02/12/2017   Procedure: ESOPHAGOGASTRODUODENOSCOPY (EGD) WITH PROPOFOL;  Surgeon: Otis Brace, MD;  Location: [facility];  Service: Gastroenterology;  Laterality: N/A;  . ESOPHAGOGASTRODUODENOSCOPY (EGD) WITH PROPOFOL N/A 07/13/2018   Procedure: ESOPHAGOGASTRODUODENOSCOPY (EGD) WITH PROPOFOL;  Surgeon: Clarene Essex, MD;  Location: [facility];  Service: Endoscopy;  Laterality: N/A;  . FOREIGN BODY REMOVAL N/A 07/13/2018   Procedure: FOREIGN BODY REMOVAL;  Surgeon: Clarene Essex, MD;  Location: [facility];  Service: Endoscopy;  Laterality: N/A;  . LEFT HEART CATH AND CORS/GRAFTS ANGIOGRAPHY N/A 11/18/2018   Procedure: LEFT HEART CATH AND CORS/GRAFTS ANGIOGRAPHY;  Surgeon: Sherren Mocha, MD;  Location: [place] [facility];  Service: Cardiovascular;  Laterality: N/A;     Home Medications:  Prior to Admission medications   Medication Sig Start Date End Date Taking? Authorizing Provider  allopurinol (ZYLOPRIM) 300 MG tablet Take 300 mg by mouth daily.   Yes [provider]  amoxicillin-clavulanate (AUGMENTIN) 875-125 MG tablet Take 1 tablet by mouth 2 (two) times daily for 10 days. 02/21/19 03/03/19 Yes Mercy Riding, MD   aspirin 81 MG tablet Take 81 mg by mouth daily.     Yes [provider]  atorvastatin (LIPITOR) 20 MG tablet Take 1 tablet (20 mg total) by mouth daily. 02/25/18 02/26/19 Yes Daune Perch, NP  clopidogrel (PLAVIX) 75 MG tablet Take 1 tablet (75 mg total) by mouth daily with breakfast. 11/20/18  Yes Barrett, Evelene Croon, PA-C  docusate sodium (COLACE) 100 MG capsule Take 1 capsule (100 mg total) by mouth daily as needed for mild constipation. 02/21/19 05/22/19 Yes Mercy Riding, MD  donepezil (ARICEPT) 10 MG tablet Take 10 mg by mouth at bedtime.  02/13/18  Yes [provider]  dorzolamide-timolol (COSOPT) 22.3-6.8 MG/ML ophthalmic solution Place 1 drop into the right eye 2 (two) times daily. Does in morning and at dinner time. 02/11/19  Yes [provider]  fludrocortisone (FLORINEF) 0.1 MG tablet Take 1 tablet (0.1 mg total) by mouth daily. 12/18/18  Yes Burtis Junes, NP  levothyroxine (SYNTHROID) 75 MCG tablet Take 75 mcg by mouth daily before breakfast.    Yes [provider]  nitroGLYCERIN (NITROSTAT) 0.4 MG SL tablet PLACE 1 TABLET (0.4 MG TOTAL) UNDER THE TONGUE EVERY 5 (FIVE) MINUTES AS NEEDED. 06/24/18  Yes Nahser, Wonda Cheng, MD  polyethylene glycol (MIRALAX) 17 g packet Take 17 g by mouth daily as needed for moderate constipation. 02/21/19  Yes Mercy Riding, MD  prednisoLONE acetate (  PRED FORTE) 1 % ophthalmic suspension Place 1 drop into the right eye 2 (two) times daily.  01/30/19  Yes [provider]  tamsulosin (FLOMAX) 0.4 MG CAPS capsule Take 0.4 mg by mouth daily.    Yes [provider]  Vitamin D, Ergocalciferol, (DRISDOL) 50000 units CAPS capsule Take 50,000 Units by mouth once a week. 07/31/17  Yes [provider]  isosorbide mononitrate (IMDUR) 30 MG 24 hr tablet Take 0.5 tablets (15 mg total) by mouth daily. Patient not taking: Reported on 02/19/2019 01/29/19 04/29/19  Burtis Junes, NP  ranolazine (RANEXA) 500 MG 12 hr  tablet TAKE 1 TABLET BY MOUTH TWICE A DAY Patient not taking: No sig reported 01/29/19   Nahser, Wonda Cheng, MD  saccharomyces boulardii (FLORASTOR) 250 MG capsule Take 1 capsule (250 mg total) by mouth 2 (two) times daily. 02/21/19   Mercy Riding, MD    Inpatient Medications: Scheduled Meds: . allopurinol  300 mg Oral Daily  . aspirin EC  81 mg Oral Daily  . atorvastatin  20 mg Oral q1800  . clopidogrel  75 mg Oral Q breakfast  . donepezil  10 mg Oral QHS  . dorzolamide-timolol  1 drop Right Eye BID  . levothyroxine  75 mcg Oral QAC breakfast  . prednisoLONE acetate  1 drop Right Eye BID  . tamsulosin  0.4 mg Oral Daily   Continuous Infusions: . sodium chloride 75 mL/hr at 02/26/19 2319  . piperacillin-tazobactam (ZOSYN)  IV 3.375 g (02/27/19 0023)   PRN Meds: acetaminophen **OR** acetaminophen, hydrALAZINE, HYDROcodone-acetaminophen, morphine injection, ondansetron **OR** ondansetron (ZOFRAN) IV  Allergies:    Allergies  Allergen Reactions  . Halcion [Triazolam]     "Makes me crazy"  . Pravachol     Unknown    Social History:   Social History   Socioeconomic History  . Marital status: Married    Spouse name: Not on file  . Number of children: Not on file  . Years of education: Not on file  . Highest education level: Not on file  Occupational History  . Occupation: Retired    Fish farm manager: RETIRED  Social Needs  . Financial resource strain: Not on file  . Food insecurity    Worry: Not on file    Inability: Not on file  . Transportation needs    Medical: Not on file    Non-medical: Not on file  Tobacco Use  . Smoking status: Former Smoker    Packs/day: 1.00    Years: 15.00    Pack years: 15.00    Types: Cigarettes    Quit date: 05/15/1958    Years since quitting: 60.8  . Smokeless tobacco: Never Used  Substance and Sexual Activity  . Alcohol use: Yes    Comment: occasional beer  . Drug use: No  . Sexual activity: Yes  Lifestyle  . Physical activity    Days  per week: Not on file    Minutes per session: Not on file  . Stress: Not on file  Relationships  . Social Herbalist on phone: Not on file    Gets together: Not on file    Attends religious service: Not on file    Active member of club or organization: Not on file    Attends meetings of clubs or organizations: Not on file    Relationship status: Not on file  . Intimate partner violence    Fear of current or ex partner: Not on file  Emotionally abused: Not on file    Physically abused: Not on file    Forced sexual activity: Not on file  Other Topics Concern  . Not on file  Social History Narrative  . Not on file    Family History:    Family History  Problem Relation Age of Onset  . Heart failure Mother 85     ROS:  Please see the history of present illness.   All other ROS reviewed and negative.     Physical Exam/Data:   Vitals:   02/26/19 2109 02/26/19 2347 02/27/19 0353 02/27/19 0402  BP: (!) 181/64 (!) 159/85 (!) 168/78 (!) 164/74  Pulse: (!) 56 62 69   Resp: _0 Temp: 97.7 F (36.5 C) 98.3 F (36.8 C) 98.5 F (36.9 C)   TempSrc: Oral Oral Oral   SpO2: 100% 100% 98%   Weight:      Height:        Intake/Output Summary (Last 24 hours) at 02/27/2019 0744 Last data filed at 02/27/2019 0600 Gross per 24 hour  Intake 1439.94 ml  Output 1300 ml  Net 139.94 ml   Filed Weights   02/26/19 1358  Weight: 71.2 kg   Body mass index is 21.29 kg/m.  General:  Well nourished, well developed, laying in bed in no acute distress; appears younger than stated age 53: sclera anicteric  Neck: no JVD Vascular: No carotid bruits; distal pulses 2+ bilaterally Cardiac:  normal S1, S2; RRR; no murmurs, rubs, or gallops Lungs:  clear to auscultation bilaterally, no wheezing, rhonchi or rales  Abd: NABS, soft, nontender, no hepatomegaly Ext: no edema Musculoskeletal:  No deformities, BUE and BLE strength normal and equal Skin: warm and dry  Neuro:   CNs 2-12 intact, no focal abnormalities noted Psych:  Normal affect   EKG:  The EKG was personally reviewed and demonstrates:  No EKG this admission; EKG 02/19/2019 with sinus rhythm with chronic inferior Q waves, no STE/D Telemetry:  Not on telemetry this admission  Relevant CV Studies: Echocardiogram 11/2018: 1. The left ventricle has normal systolic function with an ejection fraction of 60-65%. The cavity size was normal. Left ventricular diastolic Doppler parameters are consistent with impaired relaxation. Elevated left atrial and left ventricular  end-diastolic pressures The E/e' is >15. No evidence of left ventricular regional wall motion abnormalities.  2. Left atrial size was moderately dilated.  3. The mitral valve is abnormal. Mild thickening of the mitral valve leaflet. Mild calcification of the mitral valve leaflet. There is mild to moderate mitral annular calcification present.  4. The tricuspid valve is grossly normal.  5. The aortic valve is abnormal. Moderate calcification of the aortic valve. Aortic valve regurgitation is mild to moderate by color flow Doppler. No stenosis of the aortic valve.  SUMMARY   LVEF 60-65%, normal wall thickness, normal wall motion, grade 1 DD, elevated LV filling pressure, moderate LAE, MAC with mitral leaflet thickening and mild MR, calcified aortic valve with mild to moderate AI, normal IVC  Left heart catheterization 11/2018: 1.  Severe native vessel coronary artery disease with total occlusion of the RCA, total occlusion of left circumflex, and severe stenosis leading into total occlusion of the mid LAD 2.  Status post multivessel coronary bypass surgery with continued patency of the saphenous vein graft RCA, sequential saphenous vein graft to OM1 and distal circumflex, and LIMA to diagonal and mid LAD 3.  Severe stenosis involving the ostium/proximal portion of the SVG to  distal RCA, treated successfully with a 3.5 x 20 mm Synergy DES utilizing  distal embolic protection  Recommendations: Dual antiplatelet therapy with aspirin and clopidogrel 12 months as tolerated  Laboratory Data:  Chemistry Recent Labs  Lab 02/21/19 0252 02/26/19 1441  NA 134* 137  K 3.7 3.4*  CL 103 102  CO2 21* 27  GLUCOSE 112* 94  BUN 16 13  CREATININE 1.11 1.00  CALCIUM 8.2* 8.8*  GFRNONAA 59* >60  GFRAA >60 >60  ANIONGAP 10 8    Recent Labs  Lab 02/26/19 1441  PROT 7.0  ALBUMIN 4.0  AST 17  ALT 21  ALKPHOS 53  BILITOT 0.7   Hematology Recent Labs  Lab 02/21/19 0252 02/26/19 1441 02/27/19 0654  WBC 8.4 4.8 4.8  RBC 3.58* 4.04* 3.68*  HGB 11.9* 13.4 12.2*  HCT 36.0* 40.8 36.5*  MCV 100.6* 101.0* 99.2  MCH 33.2 33.2 33.2  MCHC 33.1 32.8 33.4  RDW 13.4 13.2 13.3  PLT 81* 150 146*   Cardiac EnzymesNo results for input(s): TROPONINI in the last 168 hours. No results for input(s): TROPIPOC in the last 168 hours.  BNPNo results for input(s): BNP, PROBNP in the last 168 hours.  DDimer No results for input(s): DDIMER in the last 168 hours.  Radiology/Studies:  Ct Abdomen Pelvis W Contrast  Result Date: 02/26/2019 CLINICAL DATA:  Abdominal pain, recent diverticulitis EXAM: CT ABDOMEN AND PELVIS WITH CONTRAST TECHNIQUE: Multidetector CT imaging of the abdomen and pelvis was performed using the standard protocol following bolus administration of intravenous contrast. CONTRAST:  159m OMNIPAQUE IOHEXOL 300 MG/ML  SOLN COMPARISON:  02/19/2019 FINDINGS: Lower chest: Lung bases are clear. Hepatobiliary: Liver is within normal limits. Gallbladder is unremarkable. No hepatic or extrahepatic duct dilatation. Pancreas: Within normal limits. Spleen: Trace posterior perisplenic fluid (series 2/image 19), unchanged. Adrenals/Urinary Tract: Adrenal glands are within normal limits. Small bilateral renal cysts, measuring up to 17 mm in the left lower kidney. No hydronephrosis. Bladder is within normal limits. Stomach/Bowel: Stomach is notable for a  small hiatal hernia. No evidence of bowel obstruction. Appendix is not discretely visualized. Extensive left colonic diverticulosis. While the proximal sigmoid colonic wall thickening/inflammatory changes are improved, there is now a 14 x 26 mm fluid and gas collection adjacent to the proximal sigmoid colon and beneath the left anterior abdominal wall (series 2/image 85), reflecting localized perforation. No free air. Vascular/Lymphatic: No evidence of abdominal aortic aneurysm. Atherosclerotic calcifications of the abdominal aorta and branch vessels. No suspicious abdominopelvic lymphadenopathy. Reproductive: Prostatomegaly. Other: No abdominopelvic ascites. Musculoskeletal: Grade 1 spondylolisthesis at L5-S1. Degenerative changes of the visualized thoracolumbar spine. IMPRESSION: Mild sigmoid diverticulitis, with overall improvement of wall thickening/inflammatory changes, but now with associated 2.6 cm pericolonic fluid and gas collection, reflecting localized perforation. No free air. Electronically Signed   By: SJulian HyM.D.   On: 02/26/2019 17:21    Assessment and Plan:   1. GI bleed in patient with CAD s/p CABG with [place]: patient with recent admission for diverticulitis with microperforation with subsequent admission for GI bleed with findings of a pericolonic abscess with local perforation on CT A/P. Hemoccult positive. Thankfully Hgb has remained stable; 12.2 today. IR consulted for possible drainage of abscess and felt collection was not amenable to drainage and recommended ongoing IV antibiotics with f/u CT. Surgery consulted and recommended ongoing conservative management. Situation complicated by recent PCI/DES to SVG-RCA, on DAPT with aspirin and plavix.   - Given stable Hgb/PLT counts, recommend  continuing aspirin and plavix given recent PCI/DES - Continue atorvastatin - Continue management of GI bleed/ suspected perforation/abscess per primary team, IR,  and surgery.   2. HTN: BP significantly elevated this admission. He has recently struggled with orthostatic hypotension and BBlocker/ARB therapy held. He has been on florinef - Favor stopping florinef and monitoring his BP outpatient.    3. HLD: LDL well controlled at 41 11/2018 - Continue atorvastatin 106m daily  4. Thrombocytopenia: waxing/waning. PLT stable at 146 today - Continue to monitor closely with DAPT   Patient is scheduled to see LTruitt Merle10/27/2020.    For questions or updates, please contact CMullica HillPlease consult [email or web address] for contact info under Cardiology/STEMI.   Signed, KAbigail Butts PA-C  02/27/2019 7:44 AM [phone]

## 2019-02-27 NOTE — Progress Notes (Signed)
PROGRESS NOTE  Patrick [name]  DOB: 06/14/1926  PCP: Marton Redwood, MD CS:[id number]  DOA: 02/26/2019  LOS: 1 day   Brief narrative: Patient is a 85 y.o. male with PMH of CAD/CABG and recent PCI with stent placement in 11/2018, CKD-3,Gout, BPH thrombocytopenia, dementia, hypothyroidism and GERD. Patient presented to the ED on 10/14 with complaint of abdominal pain persistent since his last discharge.  He was last hospitalized from 10/7-10/9 for sigmoid diverticulitis with microperforation but no abscess.  He improved quickly with IV antibiotics and was discharged home on oral Augmentin.  He returned back with abdominal pain 5 days later. A repeat scan shows improvement and almost resolution of his diverticulitis but a minimal 1.4x2.5cm abscess close to his anterior abdominal wall.  Patient was admitted to the hospital under hospitalist service.  Surgery consultation called.   Subjective: Patient was seen and examined this morning.  Pleasant elderly Caucasian male.  Not in distress.  Seems to have dementia.  He does not recollect being in the hospital last time.  He does not remember why he is admitted at this time again.  When asked, patient denies having dementia though.  Assessment/Plan: Diverticulitis with small fluid collection -Recently hospitalized for sigmoid diverticulitis with microperforation. -CT scan of abdomen obtained on admission shows improving diverticulitis but small amount of fluid collection. -IR reviewed the imaging and stated that it is too little for drain placement. -Per general surgery, patient to continue on IV Zosyn today and anticipate discharge in next 24 to 48 hours. -Has been started on clear liquid diet.  Coronary artery disease, recent stent placement 3 months ago Currently stable continue aspirin Plavix statin  Hyperlipemia -stable continue home medications  Hypothyroidism - continue home medications at current dose  Essential hypertension   Orthostatic hypotension History of diastolic CHF  -I discussed with patient's wife this afternoon.  Patient has history of orthostatic hypotension because of which he was started on Florinef.  On his last follow-up with primary care provider recently, his blood pressure was elevated.  Imdur was added.  They noted that one of the possible side effect from Imdur was headache.  Patient already had headache because of some eye problem when he was getting surgery.  So in anticipation of worsening headache, patient never really started Imdur.  He has coexisting CAD as well. -Blood pressure is running high today in 180s.  Hold Florinef.  Resume Imdur. -Monitor blood pressure closely.  Hypokalemia -Potassium 3.5, magnesium 2 today.  Dementia -continue home medications watch out for any signs of sundowning  BPH  Flomax  Other plan as per orders.  Mobility: PT eval DVT prophylaxis:  SCDs Code Status:   Code Status: Full Code  Family Communication:  Discussed with patient's wife this afternoon. Expected Discharge:  Anticipate discharge home in next 1 to 2 days.  Consultants:  General surgery, IR  Procedures:  None  Antimicrobials: Anti-infectives (From admission, onward)   Start     Dose/Rate Route Frequency Ordered Stop   02/26/19 2359  piperacillin-tazobactam (ZOSYN) IVPB 3.375 g     3.375 g 12.5 mL/hr over 240 Minutes Intravenous Every 8 hours 02/26/19 2217     02/26/19 1745  piperacillin-tazobactam (ZOSYN) IVPB 3.375 g     3.375 g 100 mL/hr over 30 Minutes Intravenous  Once 02/26/19 1734 02/26/19 1842      Diet Order            Diet NPO time specified  Diet effective midnight  Infusions:  . piperacillin-tazobactam (ZOSYN)  IV 3.375 g (02/27/19 0803)    Scheduled Meds: . allopurinol  300 mg Oral Daily  . aspirin EC  81 mg Oral Daily  . atorvastatin  20 mg Oral q1800  . clopidogrel  75 mg Oral Q breakfast  . donepezil  10 mg Oral QHS  .  dorzolamide-timolol  1 drop Right Eye BID  . levothyroxine  75 mcg Oral QAC breakfast  . prednisoLONE acetate  1 drop Right Eye BID  . tamsulosin  0.4 mg Oral Daily    PRN meds: acetaminophen **OR** acetaminophen, hydrALAZINE, HYDROcodone-acetaminophen, morphine injection, ondansetron **OR** ondansetron (ZOFRAN) IV   Objective: Vitals:   02/27/19 0353 02/27/19 0402  BP: (!) 168/78 (!) 164/74  Pulse: 69   Resp: 16   Temp: 98.5 F (36.9 C)   SpO2: 98%     Intake/Output Summary (Last 24 hours) at 02/27/2019 0909 Last data filed at 02/27/2019 0600 Gross per 24 hour  Intake 1439.94 ml  Output 1300 ml  Net 139.94 ml   Filed Weights   02/26/19 1358  Weight: 71.2 kg   Weight change:  Body mass index is 21.29 kg/m.   Physical Exam: General exam: Appears calm and comfortable.  Skin: No rashes, lesions or ulcers. HEENT: Atraumatic, normocephalic, supple neck, no obvious bleeding Lungs: Clear to auscultate bilaterally CVS: Regular rate and rhythm no murmur GI/Abd soft, mildly tender in the left lower quadrant, nondistended, bowel sound present CNS: Alert, awake, oriented to place and person, not oriented to time Psychiatry: Mood appropriate Extremities: No pedal edema, no calf tenderness  Data Review: I have personally reviewed the laboratory data and studies available.  Recent Labs  Lab 02/21/19 0252 02/26/19 1441 02/27/19 0654  WBC 8.4 4.8 4.8  NEUTROABS  --  2.3  --   HGB 11.9* 13.4 12.2*  HCT 36.0* 40.8 36.5*  MCV 100.6* 101.0* 99.2  PLT 81* 150 146*   Recent Labs  Lab 02/21/19 0252 02/26/19 1441 02/26/19 2214 02/27/19 0654  NA 134* 137  --  135  K 3.7 3.4*  --  3.5  CL 103 102  --  102  CO2 21* 27  --  22  GLUCOSE 112* 94  --  101*  BUN 16 13  --  9  CREATININE 1.11 1.00  --  0.94  CALCIUM 8.2* 8.8*  --  8.2*  MG  --   --  2.0 2.0  PHOS  --   --   --  3.0    Terrilee Croak, MD  [facility] 02/27/2019

## 2019-02-27 NOTE — Plan of Care (Signed)

## 2019-02-27 NOTE — Evaluation (Signed)
Occupational Therapy Evaluation Patient Details Name: Patrick [name] MRN: [id number] DOB: December 20, 1926 Today's Date: 02/27/2019    History of Present Illness Pt is an 85 yo male with PMH of dementia, CAD, gout.   Pt recently had R retinal surgery for detached retina and had PCI in July 2020.  [facility] admission 10/7-10/9/20 for diverticulitis. Now readmitted with abdominal pain.   Clinical Impression   Pt admitted with the above diagnoses and presents with below problem list. Pt will benefit from continued acute OT to address the below listed deficits and maximize independence with basic ADLs prior to d/c home. At baseline pt is independent with ADLs. Pt is currently setup with UB ADLs, min guard with LB ADLs. Utilized rw this session. Pt tolerated session well.      Follow Up Recommendations  Outpatient OT;Supervision/Assistance - 24 hour    Equipment Recommendations  Tub/shower seat    Recommendations for Other Services       Precautions / Restrictions Precautions Precautions: Fall Precaution Comments: decreased vision R eye; pt reports h/o several falls in past couple months (he thinks this is due to hypotension) Restrictions Weight Bearing Restrictions: No      Mobility Bed Mobility               General bed mobility comments: seated in chair at beginning and end of session  Transfers Overall transfer level: Needs assistance Equipment used: Rolling walker (2 wheeled) Transfers: Sit to/from Stand Sit to Stand: Supervision         General transfer comment: supervision for safety    Balance Overall balance assessment: Needs assistance Sitting-balance support: Feet supported Sitting balance-Leahy Scale: Good     Standing balance support: During functional activity;Bilateral upper extremity supported Standing balance-Leahy Scale: Fair Standing balance comment: Requires UE support for dynamic movement                           ADL either  performed or assessed with clinical judgement   ADL Overall ADL's : Needs assistance/impaired Eating/Feeding: NPO   Grooming: Wash/dry hands;Supervision/safety;Standing   Upper Body Bathing: Set up;Sitting   Lower Body Bathing: Min guard;Sit to/from stand   Upper Body Dressing : Set up;Sitting   Lower Body Dressing: Min guard;Sit to/from stand   Toilet Transfer: Min guard;Ambulation;RW   Toileting- Water quality scientist and Hygiene: Min guard;Sit to/from stand   Tub/ Shower Transfer: Walk-in shower;Cueing for safety;Cueing for sequencing;Min guard;Rolling walker;Grab bars;Ambulation   Functional mobility during ADLs: Min guard;Rolling walker General ADL Comments: Pt completed grooming task at sink and household distance functional mobility. Utilized rw and was min guard with AD.     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     Perception     Praxis      Pertinent Vitals/Pain Pain Assessment: No/denies pain     Hand Dominance Right   Extremity/Trunk Assessment Upper Extremity Assessment Upper Extremity Assessment: Overall WFL for tasks assessed   Lower Extremity Assessment Lower Extremity Assessment: Defer to PT evaluation   Cervical / Trunk Assessment Cervical / Trunk Assessment: Normal   Communication Communication Communication: No difficulties   Cognition Arousal/Alertness: Awake/alert Behavior During Therapy: WFL for tasks assessed/performed Overall Cognitive Status: Within Functional Limits for tasks assessed  General Comments: jokes A LOT!! wife confirms he ALWAYS jokes and does not answer questions directly.   General Comments       Exercises     Shoulder Instructions      Home Living Family/patient expects to be discharged to:: Private  residence Living Arrangements: Spouse/significant other Available Help at Discharge: Family;Available 24 hours/day Type of Home: House Home Access: Stairs to enter [employer] of Steps: 4 Entrance Stairs-Rails: Right Home Layout: One level     Bathroom Shower/Tub: Walk-in shower;Door   [employer] Toilet: Standard     Home Equipment: None   Additional Comments: rw and shower seat ordered during prior admission but never received despite multiple calls from spouse      Prior Functioning/Environment Level of Independence: Independent with assistive device(s)  Gait / Transfers Assistance Needed: using borrowing RW at home ADL's / Homemaking Assistance Needed: Pt can do basic adls. Not driving due to eye surgery.  Was very active but over last few months has become weaker and does less.            OT Problem List: Decreased activity tolerance;Impaired balance (sitting and/or standing);Impaired vision/perception;Decreased knowledge of use of DME or AE;Pain      OT Treatment/Interventions: Self-care/ADL training;Therapeutic activities    OT Goals(Current goals can be found in the care plan section) Acute Rehab OT Goals Patient Stated Goal: to go home OT Goal Formulation: With patient/family Time For Goal Achievement: 03/13/19 Potential to Achieve Goals: Good ADL Goals Pt Will Perform Grooming: with modified independence;standing Pt Will Perform Lower Body Bathing: with modified independence;sit to/from stand Pt Will Perform Lower Body Dressing: with modified independence;sit to/from stand Pt Will Perform Tub/Shower Transfer: with modified independence;ambulating;shower seat;rolling walker  OT Frequency: Min 2X/week   Barriers to D/C:            Co-evaluation              AM-PAC OT "6 Clicks" Daily Activity     Outcome Measure Help from another person eating meals?: None Help from another person taking care of personal grooming?: None Help from another  person toileting, which includes using toliet, bedpan, or urinal?: None Help from another person bathing (including washing, rinsing, drying)?: A Little Help from another person to put on and taking off regular upper body clothing?: None Help from another person to put on and taking off regular lower body clothing?: None 6 Click Score: 23   End of Session Equipment Utilized During Treatment: Rolling walker  Activity Tolerance: Patient tolerated treatment well Patient left: in chair;with call bell/phone within reach;with family/visitor present  OT Visit Diagnosis: Unsteadiness on feet (R26.81);Pain;History of falling (Z91.81)                Time: [id number] OT Time Calculation (min): 10 min Charges:  OT General Charges $OT Visit: 1 Visit OT Evaluation $OT Eval Low Complexity: [place], OT Acute Rehabilitation Services Pager: [phone] Office: [phone]   Hortencia Pilar 02/27/2019, 12:33 PM

## 2019-02-27 NOTE — Consult Note (Signed)
Patrick [name] 06-10-1926  [id number].    Requesting MD: Dr. Roel Cluck Chief Complaint/Reason for Consult: abscess secondary to diverticulitis  HPI:  This is an 85 yo white male with a history of CAD, s/p cardiac cath in July with placement of a DES and initiation of ASA and plavix.  He has multiple other medical problems, including dementia, which is not listed.  The patient does not have recollection of his last hospital stay or even why he was admitted this time around.  Info in taken from the chart.  Apparently he was admitted from 10/7-10/9 for diverticulitis with microperf, but no abscess.  He improved quickly and went home.  He was DC on augmentin.  Apparently the family states he has been having abdominal pain since discharge and brought him back yesterday.  A repeat scan shows improvement and almost resolution of his diverticulitis but a minimal 1.4x2.5cm abscess close to his anterior abdominal wall.  He has been admitted and we have been asked to see him.  ROS: ROS: please see HPI, otherwise systems reviewed and are negative as best as he can answer.  Family History  Problem Relation Age of Onset   Heart failure Mother 88    Past Medical History:  Diagnosis Date   BPH (benign prostatic hypertrophy)    Diverticulosis    ED (erectile dysfunction)    GERD (gastroesophageal reflux disease)    Gout    Hepatitis    hx hepatitis after mono as teenager   History of kidney stones    History of skin cancer    Hyperlipidemia    Hypertension    Hypothyroidism    IHD (ischemic heart disease)    Remote CABG in 1997   MI, acute, non ST segment elevation ([place]) 2010   s/p cath with occluded SVG to PD that fills by collaterals and the remainder of his revasculsarization is satisfactory. "the first time they did the stent , the second time they did the graft 8 vessels"   Nocturia    Thrombocytopenia ([place]) 11/19/2018   Urinary frequency     Past Surgical History:    Procedure Laterality Date   APPENDECTOMY     CARDIAC CATHETERIZATION  09/08/2008   NORMAL. EF 60%; Occluded SVG to PD that fills by collaterals and the remainder of his revascularization is satisfactory. he is managed medically.    CORONARY ARTERY BYPASS GRAFT  1997   CABG x 8   CORONARY STENT INTERVENTION N/A 11/18/2018   Procedure: CORONARY STENT INTERVENTION;  Surgeon: Sherren Mocha, MD;  Location: [facility];  Service: Cardiovascular;  Laterality: N/A;   CYSTOSCOPY WITH INSERTION OF UROLIFT N/A 03/19/2015   Procedure: CYSTOSCOPY WITH INSERTION OF FOUR UROLIFTS;  Surgeon: Carolan Clines, MD;  Location: [facility];  Service: Urology;  Laterality: N/A;   ESOPHAGOGASTRODUODENOSCOPY (EGD) WITH PROPOFOL N/A 02/12/2017   Procedure: ESOPHAGOGASTRODUODENOSCOPY (EGD) WITH PROPOFOL;  Surgeon: Otis Brace, MD;  Location: [facility];  Service: Gastroenterology;  Laterality: N/A;   ESOPHAGOGASTRODUODENOSCOPY (EGD) WITH PROPOFOL N/A 07/13/2018   Procedure: ESOPHAGOGASTRODUODENOSCOPY (EGD) WITH PROPOFOL;  Surgeon: Clarene Essex, MD;  Location: [facility];  Service: Endoscopy;  Laterality: N/A;   FOREIGN BODY REMOVAL N/A 07/13/2018   Procedure: FOREIGN BODY REMOVAL;  Surgeon: Clarene Essex, MD;  Location: [facility];  Service: Endoscopy;  Laterality: N/A;   LEFT HEART CATH AND CORS/GRAFTS ANGIOGRAPHY N/A 11/18/2018   Procedure: LEFT HEART CATH AND CORS/GRAFTS ANGIOGRAPHY;  Surgeon: Sherren Mocha, MD;  Location: [facility]  INVASIVE [facility];  Service: Cardiovascular;  Laterality: N/A;    Social History:  reports that he quit smoking about 60 years ago. His smoking use included cigarettes. He has a 15.00 pack-year smoking history. He has never used smokeless tobacco. He reports current alcohol use. He reports that he does not use drugs.  Allergies:  Allergies  Allergen Reactions   Halcion [Triazolam]     "Makes me crazy"   Pravachol     Unknown    Medications Prior to Admission   Medication Sig Dispense Refill   allopurinol (ZYLOPRIM) 300 MG tablet Take 300 mg by mouth daily.     amoxicillin-clavulanate (AUGMENTIN) 875-125 MG tablet Take 1 tablet by mouth 2 (two) times daily for 10 days. 20 tablet 0   aspirin 81 MG tablet Take 81 mg by mouth daily.       atorvastatin (LIPITOR) 20 MG tablet Take 1 tablet (20 mg total) by mouth daily. 90 tablet 3   clopidogrel (PLAVIX) 75 MG tablet Take 1 tablet (75 mg total) by mouth daily with breakfast. 30 tablet 11   docusate sodium (COLACE) 100 MG capsule Take 1 capsule (100 mg total) by mouth daily as needed for mild constipation. 30 capsule 2   donepezil (ARICEPT) 10 MG tablet Take 10 mg by mouth at bedtime.   0   dorzolamide-timolol (COSOPT) 22.3-6.8 MG/ML ophthalmic solution Place 1 drop into the right eye 2 (two) times daily. Does in morning and at dinner time.     fludrocortisone (FLORINEF) 0.1 MG tablet Take 1 tablet (0.1 mg total) by mouth daily. 30 tablet 3   levothyroxine (SYNTHROID) 75 MCG tablet Take 75 mcg by mouth daily before breakfast.      nitroGLYCERIN (NITROSTAT) 0.4 MG SL tablet PLACE 1 TABLET (0.4 MG TOTAL) UNDER THE TONGUE EVERY 5 (FIVE) MINUTES AS NEEDED. 75 tablet 2   polyethylene glycol (MIRALAX) 17 g packet Take 17 g by mouth daily as needed for moderate constipation. 14 each 0   prednisoLONE acetate (PRED FORTE) 1 % ophthalmic suspension Place 1 drop into the right eye 2 (two) times daily.      tamsulosin (FLOMAX) 0.4 MG CAPS capsule Take 0.4 mg by mouth daily.      Vitamin D, Ergocalciferol, (DRISDOL) 50000 units CAPS capsule Take 50,000 Units by mouth once a week.  3   isosorbide mononitrate (IMDUR) 30 MG 24 hr tablet Take 0.5 tablets (15 mg total) by mouth daily. (Patient not taking: Reported on 02/19/2019) 30 tablet 3   ranolazine (RANEXA) 500 MG 12 hr tablet TAKE 1 TABLET BY MOUTH TWICE A DAY (Patient not taking: No sig reported) 180 tablet 3   saccharomyces boulardii (FLORASTOR) 250 MG  capsule Take 1 capsule (250 mg total) by mouth 2 (two) times daily. 20 capsule 0     Physical Exam: Blood pressure (!) 164/74, pulse 69, temperature 98.5 F (36.9 C), temperature source Oral, resp. rate 16, height 6' (1.829 m), weight 71.2 kg, SpO2 98 %. General: pleasant, WD, WN white male who is laying in bed in NAD HEENT: head is normocephalic, atraumatic.  Sclera are noninjected.  PERRL.  Ears and nose without any masses or lesions.  Mouth is pink and moist Heart: regular, rate, and rhythm.  Normal s1,s2. No obvious murmurs, gallops, or rubs noted.  Palpable radial and pedal pulses bilaterally Lungs: CTAB, no wheezes, rhonchi, or rales noted.  Respiratory effort nonlabored Abd: soft, minute tenderness noted in LLQ, otherwise completely nontedner, ND, +BS, no  masses, hernias, or organomegaly MS: all 4 extremities are symmetrical with no cyanosis, clubbing, or edema. Skin: warm and dry with no masses, lesions, or rashes Psych: Alert and oriented to place and person, but does not know why he is here or remember recent events such as last week's hospitalization   Results for orders placed or performed during the hospital encounter of 02/26/19 (from the past 48 hour(s))  Occult blood card to lab, stool Provider will collect     Status: Abnormal   Collection Time: 02/26/19  2:27 PM  Result Value Ref Range   Fecal Occult Bld POSITIVE (A) NEGATIVE    Comment: Performed at [facility], [address] [place], [place] [address]  CBC with Differential/Platelet     Status: Abnormal   Collection Time: 02/26/19  2:41 PM  Result Value Ref Range   WBC 4.8 4.0 - 10.5 K/uL   RBC 4.04 (L) 4.22 - 5.81 MIL/uL   Hemoglobin 13.4 13.0 - 17.0 g/dL   HCT 40.8 39.0 - 52.0 %   MCV 101.0 (H) 80.0 - 100.0 fL   MCH 33.2 26.0 - 34.0 pg   MCHC 32.8 30.0 - 36.0 g/dL   RDW 13.2 11.5 - 15.5 %   Platelets 150 150 - 400 K/uL   nRBC 0.0 0.0 - 0.2 %   Neutrophils Relative % 48 %   Neutro Abs 2.3 1.7 -  7.7 K/uL   Lymphocytes Relative 17 %   Lymphs Abs 0.8 0.7 - 4.0 K/uL   Monocytes Relative 28 %   Monocytes Absolute 1.3 (H) 0.1 - 1.0 K/uL   Eosinophils Relative 0 %   Eosinophils Absolute 0.0 0.0 - 0.5 K/uL   Basophils Relative 0 %   Basophils Absolute 0.0 0.0 - 0.1 K/uL   Immature Granulocytes 7 %   Abs Immature Granulocytes 0.34 (H) 0.00 - 0.07 K/uL   Abnormal Lymphocytes Present PRESENT    Ovalocytes PRESENT    Giant PLTs PRESENT     Comment: Performed at [facility], [address] [place], [place] [address]  Comprehensive metabolic panel     Status: Abnormal   Collection Time: 02/26/19  2:41 PM  Result Value Ref Range   Sodium 137 135 - 145 mmol/L   Potassium 3.4 (L) 3.5 - 5.1 mmol/L   Chloride 102 98 - 111 mmol/L   CO2 27 22 - 32 mmol/L   Glucose, Bld 94 70 - 99 mg/dL   BUN 13 8 - 23 mg/dL   Creatinine, Ser 1.00 0.61 - 1.24 mg/dL   Calcium 8.8 (L) 8.9 - 10.3 mg/dL   Total Protein 7.0 6.5 - 8.1 g/dL   Albumin 4.0 3.5 - 5.0 g/dL   AST 17 15 - 41 U/L   ALT 21 0 - 44 U/L   Alkaline Phosphatase 53 38 - 126 U/L   Total Bilirubin 0.7 0.3 - 1.2 mg/dL   GFR calc non Af Amer >60 >60 mL/min   GFR calc Af Amer >60 >60 mL/min   Anion gap 8 5 - 15    Comment: Performed at [facility], [address] [place], [place] [address]  Culture, blood (routine x 2)     Status: None (Preliminary result)   Collection Time: 02/26/19  3:45 PM   Specimen: BLOOD  Result Value Ref Range   Specimen Description      BLOOD RIGHT ANTECUBITAL Performed at [facility], [address] [place], [place] [address]    Special Requests      BOTTLES DRAWN AEROBIC AND ANAEROBIC Blood Culture adequate volume Performed at [facility], [address] [place], [place] [address]    Culture      NO GROWTH < 24 HOURS Performed at [facility], [place] [address], [place], [place] [address]    Report Status PENDING   Culture, blood (routine x 2)      Status: None (Preliminary result)   Collection Time: 02/26/19  3:54 PM   Specimen: BLOOD  Result Value Ref Range   Specimen Description      BLOOD LEFT ANTECUBITAL Performed at [facility], [address] [place], [place] [address]    Special Requests      BOTTLES DRAWN AEROBIC AND ANAEROBIC Blood Culture adequate volume Performed at [facility], [address] [place], [place] [address]    Culture      NO GROWTH < 24 HOURS Performed at [facility], [place] [address] [place], [place] [address]    Report Status PENDING   Urinalysis, Routine w reflex microscopic     Status: None   Collection Time: 02/26/19  4:30 PM  Result Value Ref Range   Color, Urine YELLOW YELLOW   APPearance CLEAR CLEAR   Specific Gravity, Urine 1.020 1.005 - 1.030   pH 6.5 5.0 - 8.0   Glucose, UA NEGATIVE NEGATIVE mg/dL   Hgb urine dipstick NEGATIVE NEGATIVE   Bilirubin Urine NEGATIVE NEGATIVE   Ketones, ur NEGATIVE NEGATIVE mg/dL   Protein, ur NEGATIVE NEGATIVE mg/dL   Nitrite NEGATIVE NEGATIVE   Leukocytes,Ua NEGATIVE NEGATIVE    Comment: Microscopic not done on urines with negative protein, blood, leukocytes, nitrite, or glucose < 500 mg/dL. Performed at [facility], [address] [place], [place] [address]   SARS Coronavirus 2 by RT PCR (hospital order, performed in [facility] hospital lab) Nasopharyngeal Nasopharyngeal Swab     Status: None   Collection Time: 02/26/19  6:05 PM   Specimen: Nasopharyngeal Swab  Result Value Ref Range   SARS Coronavirus 2 NEGATIVE NEGATIVE    Comment: (NOTE) If result is NEGATIVE SARS-CoV-2 target nucleic acids are NOT DETECTED. The SARS-CoV-2 RNA is generally detectable in upper and lower  respiratory specimens during the acute phase of infection. The lowest  concentration of SARS-CoV-2 viral copies this assay can detect is 250  copies / mL. A negative result does not preclude SARS-CoV-2 infection  and should not be  used as the sole basis for treatment or other  patient management decisions.  A negative result may occur with  improper specimen collection / handling, submission of specimen other  than nasopharyngeal swab, presence of viral mutation(s) within the  areas targeted by this assay, and inadequate number of viral copies  (<250 copies / mL). A negative result must be combined with clinical  observations, patient history, and epidemiological information. If result is POSITIVE SARS-CoV-2 target nucleic acids are DETECTED. The SARS-CoV-2 RNA is generally detectable in upper and lower  respiratory specimens dur ing the acute phase of infection.  Positive  results are indicative of active infection with SARS-CoV-2.  Clinical  correlation with patient history and other diagnostic information is  necessary to determine patient infection status.  Positive results do  not rule out bacterial infection or co-infection with other viruses. If result is PRESUMPTIVE POSTIVE SARS-CoV-2 nucleic acids MAY  BE PRESENT.   A presumptive positive result was obtained on the submitted specimen  and confirmed on repeat testing.  While 2019 novel coronavirus  (SARS-CoV-2) nucleic acids may be present in the submitted sample  additional confirmatory testing may be necessary for epidemiological  and / or clinical management purposes  to differentiate between  SARS-CoV-2 and other Sarbecovirus currently known to infect humans.  If clinically indicated additional testing with an alternate test  methodology [id number]) is advised. The SARS-CoV-2 RNA is generally  detectable in upper and lower respiratory sp ecimens during the acute  phase of infection. The expected result is Negative. Fact Sheet for Patients:  StrictlyIdeas.no Fact Sheet for Healthcare Providers: BankingDealers.co.za This test is not yet approved or cleared by the [place] FDA and has been authorized  for detection and/or diagnosis of SARS-CoV-2 by FDA under an Emergency Use Authorization (EUA).  This EUA will remain in effect (meaning this test can be used) for the duration of the COVID-19 declaration under Section 564(b)(1) of the Act, 21 U.S.C. section 360bbb-3(b)(1), unless the authorization is terminated or revoked sooner. Performed at [facility], [address], [place], [place] [address]   Magnesium     Status: None   Collection Time: 02/26/19 10:14 PM  Result Value Ref Range   Magnesium 2.0 1.7 - 2.4 mg/dL    Comment: Performed at [facility], [place] [address], [place], [place] [address]  Magnesium     Status: None   Collection Time: 02/27/19  6:54 AM  Result Value Ref Range   Magnesium 2.0 1.7 - 2.4 mg/dL    Comment: Performed at [facility], [place] [address] [place] [address] [place], [place] [address]  Phosphorus     Status: None   Collection Time: 02/27/19  6:54 AM  Result Value Ref Range   Phosphorus 3.0 2.5 - 4.6 mg/dL    Comment: Performed at [facility], [place] [address], [place], [place] [address]  Comprehensive metabolic panel     Status: Abnormal   Collection Time: 02/27/19  6:54 AM  Result Value Ref Range   Sodium 135 135 - 145 mmol/L   Potassium 3.5 3.5 - 5.1 mmol/L   Chloride 102 98 - 111 mmol/L   CO2 22 22 - 32 mmol/L   Glucose, Bld 101 (H) 70 - 99 mg/dL   BUN 9 8 - 23 mg/dL   Creatinine, Ser 0.94 0.61 - 1.24 mg/dL   Calcium 8.2 (L) 8.9 - 10.3 mg/dL   Total Protein 6.0 (L) 6.5 - 8.1 g/dL   Albumin 3.4 (L) 3.5 - 5.0 g/dL   AST 18 15 - 41 U/L   ALT 20 0 - 44 U/L   Alkaline Phosphatase 44 38 - 126 U/L   Total Bilirubin 0.6 0.3 - 1.2 mg/dL   GFR calc non Af Amer >60 >60 mL/min   GFR calc Af Amer >60 >60 mL/min   Anion gap 11 5 - 15    Comment: Performed at [facility], [place] [address], [place], [place] [address]  CBC     Status: Abnormal   Collection Time: 02/27/19  6:54 AM    Result Value Ref Range   WBC 4.8 4.0 - 10.5 K/uL   RBC 3.68 (L) 4.22 - 5.81 MIL/uL   Hemoglobin 12.2 (L) 13.0 - 17.0 g/dL   HCT 36.5 (L) 39.0 - 52.0 %   MCV 99.2 80.0 - 100.0 fL   MCH 33.2 26.0 -  34.0 pg   MCHC 33.4 30.0 - 36.0 g/dL   RDW 13.3 11.5 - 15.5 %   Platelets 146 (L) 150 - 400 K/uL   nRBC 0.0 0.0 - 0.2 %    Comment: Performed at [facility], [place] [address], [place], [place] [address]   Ct Abdomen Pelvis W Contrast  Result Date: 02/26/2019 CLINICAL DATA:  Abdominal pain, recent diverticulitis EXAM: CT ABDOMEN AND PELVIS WITH CONTRAST TECHNIQUE: Multidetector CT imaging of the abdomen and pelvis was performed using the standard protocol following bolus administration of intravenous contrast. CONTRAST:  120mL OMNIPAQUE IOHEXOL 300 MG/ML  SOLN COMPARISON:  02/19/2019 FINDINGS: Lower chest: Lung bases are clear. Hepatobiliary: Liver is within normal limits. Gallbladder is unremarkable. No hepatic or extrahepatic duct dilatation. Pancreas: Within normal limits. Spleen: Trace posterior perisplenic fluid (series 2/image 19), unchanged. Adrenals/Urinary Tract: Adrenal glands are within normal limits. Small bilateral renal cysts, measuring up to 17 mm in the left lower kidney. No hydronephrosis. Bladder is within normal limits. Stomach/Bowel: Stomach is notable for a small hiatal hernia. No evidence of bowel obstruction. Appendix is not discretely visualized. Extensive left colonic diverticulosis. While the proximal sigmoid colonic wall thickening/inflammatory changes are improved, there is now a 14 x 26 mm fluid and gas collection adjacent to the proximal sigmoid colon and beneath the left anterior abdominal wall (series 2/image 85), reflecting localized perforation. No free air. Vascular/Lymphatic: No evidence of abdominal aortic aneurysm. Atherosclerotic calcifications of the abdominal aorta and branch vessels. No suspicious abdominopelvic lymphadenopathy. Reproductive:  Prostatomegaly. Other: No abdominopelvic ascites. Musculoskeletal: Grade 1 spondylolisthesis at L5-S1. Degenerative changes of the visualized thoracolumbar spine. IMPRESSION: Mild sigmoid diverticulitis, with overall improvement of wall thickening/inflammatory changes, but now with associated 2.6 cm pericolonic fluid and gas collection, reflecting localized perforation. No free air. Electronically Signed   By: Julian Hy M.D.   On: 02/26/2019 17:21      Assessment/Plan CAD, s/p DES - on plavix and ASA.  No acute surgical plans, can continue these for now given improvement in patient's symptoms HLD HTN dCHF Dementia  Diverticulitis with small fluid collection The patient appears to have improving diverticulitis with almost complete resolution of this inflammatory process.  He has a tiny air-fluid collection that is too small to drain per IR.  He has no elevation of his WBC or fever.  His abdominal pain is essentially resolved.  Recommend starting clear liquid diet today.  Continue on zosyn since he is here and convert back to augmentin at discharge for further oral therapy.  Doubt he will progress to need for surgery given his overall improvement clinically and radiographically, but it is possible if he worsens he may require colectomy.  We will follow while he is here.   FEN - CLD VTE - ASA, Plavix ID - [place], [facility] 02/27/2019, 9:11 AM Please see Amion for pager number during day hours 7:00am-4:30pm

## 2019-02-27 NOTE — Evaluation (Signed)
Physical Therapy Evaluation Patient Details Name: Patrick [name] MRN: [id number] DOB: 1926-11-28 Today's Date: 02/27/2019   History of Present Illness  Pt is an 85 yo male with PMH of dementia, CAD, gout.   Pt recently had R retinal surgery for detached retina and had PCI in July 2020.  [facility] admission 10/7-10/9/20 for diverticulitis. Now readmitted with abdominal pain.  Clinical Impression  Pt admitted with above diagnosis. Pt ambulated 400' with RW, no loss of balance. Pt has some mild balance deficits and would benefit from outpt PT. Pt currently with functional limitations due to the deficits listed below (see PT Problem List). Pt will benefit from skilled PT to increase their independence and safety with mobility to allow discharge to the venue listed below.       Follow Up Recommendations Outpatient PT    Equipment Recommendations  Rolling walker with 5" wheels  (RW was ordered last admission but never delivered to pt)   Recommendations for Other Services       Precautions / Restrictions Precautions Precautions: Fall Precaution Comments: decreased vision R eye; pt reports h/o several falls in past couple months (he thinks this is due to hypotension) Restrictions Weight Bearing Restrictions: No      Mobility  Bed Mobility               General bed mobility comments: seated in chair at beginning and end of session  Transfers Overall transfer level: Needs assistance Equipment used: Rolling walker (2 wheeled) Transfers: Sit to/from Stand Sit to Stand: Supervision         General transfer comment: cues for hand placement and not to pull on RW  Ambulation/Gait   Gait Distance (Feet): 400 Feet Assistive device: Rolling walker (2 wheeled) Gait Pattern/deviations: Step-through pattern;Trunk flexed Gait velocity: WNL   General Gait Details: steady with RW  Stairs            Wheelchair Mobility    Modified Rankin (Stroke Patients Only)        Balance Overall balance assessment: Needs assistance Sitting-balance support: Feet supported Sitting balance-Leahy Scale: Good     Standing balance support: During functional activity;Bilateral upper extremity supported Standing balance-Leahy Scale: Fair Standing balance comment: Requires UE support for dynamic movement                             Pertinent Vitals/Pain Pain Assessment: No/denies pain    Home Living Family/patient expects to be discharged to:: Private residence Living Arrangements: Spouse/significant other Available Help at Discharge: Family;Available 24 hours/day Type of Home: House Home Access: Stairs to enter Entrance Stairs-Rails: Right Entrance Stairs-Number of Steps: 4 Home Layout: One level Home Equipment: None Additional Comments: pt/wife report a RW was ordered during admission last week but was never delivered to pt, he was [facility] home and was told RW would be delivered to home, it wasn't    Prior Function Level of Independence: Independent with assistive device(s)   Gait / Transfers Assistance Needed: using borrowing RW at home           Hand Dominance        Extremity/Trunk Assessment   Upper Extremity Assessment Upper Extremity Assessment: Defer to OT evaluation    Lower Extremity Assessment Lower Extremity Assessment: Overall WFL for tasks assessed    Cervical / Trunk Assessment Cervical / Trunk Assessment: Normal  Communication   Communication: No difficulties  Cognition Arousal/Alertness: Awake/alert Behavior During Therapy: [facility]  for tasks assessed/performed Overall Cognitive Status: Within Functional Limits for tasks assessed                                 General Comments: jokes A LOT!! wife confirms he ALWAYS jokes and does not answer questions directly.      General Comments      Exercises     Assessment/Plan    PT Assessment Patient needs continued PT services  PT Problem List  Decreased strength;Decreased activity tolerance;Decreased balance;Decreased mobility;Decreased knowledge of use of DME       PT Treatment Interventions DME instruction;Gait training;Stair training;Functional mobility training;Balance training;Therapeutic exercise;Therapeutic activities;Patient/family education    PT Goals (Current goals can be found in the Care Plan section)  Acute Rehab PT Goals Patient Stated Goal: to go home PT Goal Formulation: With patient/family Time For Goal Achievement: 03/13/19 Potential to Achieve Goals: Good    Frequency Min 3X/week   Barriers to discharge        Co-evaluation               AM-PAC PT "6 Clicks" Mobility  Outcome Measure Help needed turning from your back to your side while in a flat bed without using bedrails?: None Help needed moving from lying on your back to sitting on the side of a flat bed without using bedrails?: None Help needed moving to and from a bed to a chair (including a wheelchair)?: A Little Help needed standing up from a chair using your arms (e.g., wheelchair or bedside chair)?: A Little Help needed to walk in hospital room?: A Little Help needed climbing 3-5 steps with a railing? : A Little 6 Click Score: 20    End of Session Equipment Utilized During Treatment: Gait belt Activity Tolerance: Patient tolerated treatment well Patient left: in chair;with call bell/phone within reach;with family/visitor present Nurse Communication: Mobility status PT Visit Diagnosis: Difficulty in walking, not elsewhere classified (R26.2);Muscle weakness (generalized) (M62.81)    Time: [id number] PT Time Calculation (min) (ACUTE ONLY): 20 min   Charges:   PT Evaluation $PT Eval Low Complexity: 1 Low          Philomena Doheny PT 02/27/2019  Acute Rehabilitation Services Pager [phone] Office [phone]

## 2019-02-28 ENCOUNTER — Other Ambulatory Visit: Payer: Self-pay | Admitting: *Deleted

## 2019-02-28 ENCOUNTER — Telehealth: Payer: Self-pay | Admitting: Nurse Practitioner

## 2019-02-28 MED ORDER — SACCHAROMYCES BOULARDII 250 MG PO CAPS: 250.0000 mg | ORAL_CAPSULE | Freq: Two times a day (BID) | ORAL | 0 refills | Status: AC

## 2019-02-28 MED ORDER — VERAPAMIL HCL 40 MG PO TABS: 40.0000 mg | ORAL_TABLET | Freq: Three times a day (TID) | ORAL | 0 refills | Status: DC | PRN

## 2019-02-28 MED ORDER — NONFORMULARY OR COMPOUNDED ITEM: 0 refills | Status: DC

## 2019-02-28 MED ORDER — AMOXICILLIN-POT CLAVULANATE 875-125 MG PO TABS: 1.0000 | ORAL_TABLET | Freq: Two times a day (BID) | ORAL | Status: DC

## 2019-02-28 MED ORDER — LEVOTHYROXINE SODIUM 100 MCG PO TABS: 100.0000 ug | ORAL_TABLET | Freq: Every day | ORAL | Status: DC

## 2019-02-28 MED ORDER — LEVOTHYROXINE SODIUM 100 MCG PO TABS: 100.0000 ug | ORAL_TABLET | Freq: Every day | ORAL | 0 refills | Status: DC

## 2019-02-28 MED ORDER — AMOXICILLIN-POT CLAVULANATE 875-125 MG PO TABS: 1.0000 | ORAL_TABLET | Freq: Two times a day (BID) | ORAL | 0 refills | Status: AC

## 2019-02-28 NOTE — Discharge Summary (Signed)
Physician Discharge Summary  Patrick [name] [id number] DOB: Jul 14, 1926 DOA: 02/26/2019  PCP: Marton Redwood, MD  Admit date: 02/26/2019 Discharge date: 02/28/2019  Admitted From: Home Discharge disposition: Home with outpatient PT, rolling walker, 3 in 1 commode   Code Status: Full Code  Diet Recommendation: Soft diet for next 2 to 3 days, then advance to cardiac diet as tolerated.   Recommendations for Outpatient Follow-Up:   1. Follow-up with PCP as an outpatient 2. Follow-up with general surgery as an outpatient in 2 weeks  Discharge Diagnosis:   Active Problems:   Coronary artery disease involving native coronary artery of native heart without angina pectoris   Hyperlipemia   Hypothyroidism   Essential (primary) hypertension   Diverticulitis of intestine with perforation and abscess without bleeding   Hypokalemia   Occult blood positive stool    History of Present Illness / Brief narrative:  Patient is a 85 y.o.malewith PMH of CAD/CABG and recent PCI with stent placement in 11/2018, CKD-3,Gout, BPH thrombocytopenia, dementia, hypothyroidism and GERD. Patient presented to the ED on 10/14 with complaint of abdominal pain persistent since his last discharge.  He was last hospitalized from 10/7-10/9 for sigmoid diverticulitis with microperforation but no abscess.  He improved quickly with IV antibiotics and was discharged home on oral Augmentin.  He returned back with abdominal pain 5 days later. A repeat scan shows improvement and almost resolution of his diverticulitis but a minimal 1.4x2.5cm abscess close to his anterior abdominal wall.  Patient was admitted to the hospital under hospitalist service.  Surgery consultation called.  Hospital Course:  Diverticulitis with small fluid collection -Recently hospitalized for sigmoid diverticulitis with microperforation. -CT scan of abdomen obtained on admission shows improving diverticulitis but small amount of fluid  collection. -IR reviewed the imaging and stated that it is too little for drain placement. -Prior to admission, patient was augmented.  On admission, he was switched to IV Zosyn.  General surgery consultation obtained.  Okay to discharge to home.  Patient was already improving on oral Augmentin which will resume at discharge for another 10 days.  I will also add probiotics to the regimen.   -Patient will follow-up with general surgery in 2 weeks.  May get follow-up CT as an outpatient.   -I would continue soft diet at home for next 2 to 3 days before advancing to regular.  Coronary artery disease,  Hyperlipemia -recent stent placement 3 months ago -Currently stable continue aspirin Plavix statin  Essential hypertension Orthostatic hypotension History of diastolic CHF -I discussed with patient's wife this afternoon.  Patient has history of orthostatic hypotension because of which he was started on Florinef.  On his last follow-up with primary care provider recently, his blood pressure was elevated.  Imdur was added.  They noted that one of the possible side effect from Imdur was headache.  Patient already had headache because of some eye problem when he was getting surgery.  So in anticipation of worsening headache, patient never really started Imdur.  He has coexisting CAD as well. -Cardiology consult obtained. -Both Florinef and Imdur have been stopped. -Cardiology recommended PRN verapamil 40mg  for SBP > 160, so far, has not required any usage in the hospital so far. His wife can measure his BP twice a day at home.  Hypothyroidism-takes levothyroxine 75 mcg at home.  TSH level significantly elevated to 24.  I will increase levothyroxine dose to 100 mcg.   Dementia- continue home medications watch out for any signs of  sundowning  BPH- Flomax  Stable for discharge to home today.  I have explained the patient's wife about the change in medications and the need to monitor at home  and follow-up as an outpatient.   Subjective:  Seen and examined this morning.  Pleasant elderly cheerful male with mild to moderate dementia. No abdominal pain or tenderness.  Feels ready to go home.  Discharge Exam:   Vitals:   02/27/19 2038 02/28/19 0107 02/28/19 0512 02/28/19 1000  BP: (!) 162/70 (!) 159/92 138/73 (!) 142/76  Pulse: 65 76 70 62  Resp: 16 16 16 17   Temp: 99 F (37.2 C) 98.1 F (36.7 C) 98.4 F (36.9 C) 98.6 F (37 C)  TempSrc: Oral Oral Oral   SpO2: 99% 99% 99% 100%  Weight:      Height:        Body mass index is 21.29 kg/m.  General exam: Appears calm and comfortable.  Skin: No rashes, lesions or ulcers. HEENT: Atraumatic, normocephalic, supple neck, no obvious bleeding Lungs: Clear to auscultate bilaterally CVS: Regular rate and rhythm, no murmur GI/Abd soft, nontender, no distention, bowel sound present CNS: Alert, awake, oriented to place and person Psychiatry: Cheerful Extremities: No pedal edema, no calf tenderness  Discharge Instructions:  Wound care: None Discharge Instructions    Increase activity slowly   Complete by: As directed      Follow-up Information    Burtis Junes, NP Follow up on 03/11/2019.   Specialties: Nurse Practitioner, Interventional Cardiology, Cardiology, Radiology Why: Please arrive 15 minutes early for your 3:30pm appointment Contact information: [address] [place] [address] [phone]        Autumn Messing III, MD Follow up in 2 week(s).   Specialty: General Surgery Contact information: [address] [place] [address] [phone]          Allergies as of 02/28/2019      Reactions   Halcion [triazolam]    "Makes me crazy"   Pravachol    Unknown      Medication List    STOP taking these medications   fludrocortisone 0.1 MG tablet Commonly known as: FLORINEF   isosorbide mononitrate 30 MG 24 hr tablet Commonly known as: IMDUR     TAKE these  medications   allopurinol 300 MG tablet Commonly known as: ZYLOPRIM Take 300 mg by mouth daily.   amoxicillin-clavulanate 875-125 MG tablet Commonly known as: Augmentin Take 1 tablet by mouth 2 (two) times daily for 10 days.   aspirin 81 MG tablet Take 81 mg by mouth daily.   atorvastatin 20 MG tablet Commonly known as: LIPITOR Take 1 tablet (20 mg total) by mouth daily.   clopidogrel 75 MG tablet Commonly known as: PLAVIX Take 1 tablet (75 mg total) by mouth daily with breakfast.   docusate sodium 100 MG capsule Commonly known as: Colace Take 1 capsule (100 mg total) by mouth daily as needed for mild constipation.   donepezil 10 MG tablet Commonly known as: ARICEPT Take 10 mg by mouth at bedtime.   dorzolamide-timolol 22.3-6.8 MG/ML ophthalmic solution Commonly known as: COSOPT Place 1 drop into the right eye 2 (two) times daily. Does in morning and at dinner time.   levothyroxine 100 MCG tablet Commonly known as: SYNTHROID Take 1 tablet (100 mcg total) by mouth daily before breakfast. What changed:   medication strength  how much to take   nitroGLYCERIN 0.4 MG SL tablet Commonly known as:  NITROSTAT PLACE 1 TABLET (0.4 MG TOTAL) UNDER THE TONGUE EVERY 5 (FIVE) MINUTES AS NEEDED.   NONFORMULARY OR COMPOUNDED ITEM Elderly male with impaired mobility, Benefits from outpatient PT per physical therapy.   polyethylene glycol 17 g packet Commonly known as: MiraLax Take 17 g by mouth daily as needed for moderate constipation.   prednisoLONE acetate 1 % ophthalmic suspension Commonly known as: PRED FORTE Place 1 drop into the right eye 2 (two) times daily.   ranolazine 500 MG 12 hr tablet Commonly known as: RANEXA TAKE 1 TABLET BY MOUTH TWICE A DAY   saccharomyces boulardii 250 MG capsule Commonly known as: Florastor Take 1 capsule (250 mg total) by mouth 2 (two) times daily for 10 days.   tamsulosin 0.4 MG Caps capsule Commonly known as: FLOMAX Take 0.4 mg  by mouth daily.   verapamil 40 MG tablet Commonly known as: CALAN Take 1 tablet (40 mg total) by mouth every 8 (eight) hours as needed (systolic blood pressure [id number]).   Vitamin D (Ergocalciferol) 1.25 MG (50000 UT) Caps capsule Commonly known as: DRISDOL Take 50,000 Units by mouth once a week.            Durable Medical Equipment  (From admission, onward)         Start     Ordered   02/27/19 1303  For home use only DME Walker  Once    Question:  Patient needs a walker to treat with the following condition  Answer:  Impaired mobility   02/27/19 1303          Time coordinating discharge: 35 minutes  The results of significant diagnostics from this hospitalization (including imaging, microbiology, ancillary and laboratory) are listed below for reference.    Procedures and Diagnostic Studies:   Ct Abdomen Pelvis W Contrast  Result Date: 02/26/2019 CLINICAL DATA:  Abdominal pain, recent diverticulitis EXAM: CT ABDOMEN AND PELVIS WITH CONTRAST TECHNIQUE: Multidetector CT imaging of the abdomen and pelvis was performed using the standard protocol following bolus administration of intravenous contrast. CONTRAST:  16mL OMNIPAQUE IOHEXOL 300 MG/ML  SOLN COMPARISON:  02/19/2019 FINDINGS: Lower chest: Lung bases are clear. Hepatobiliary: Liver is within normal limits. Gallbladder is unremarkable. No hepatic or extrahepatic duct dilatation. Pancreas: Within normal limits. Spleen: Trace posterior perisplenic fluid (series 2/image 19), unchanged. Adrenals/Urinary Tract: Adrenal glands are within normal limits. Small bilateral renal cysts, measuring up to 17 mm in the left lower kidney. No hydronephrosis. Bladder is within normal limits. Stomach/Bowel: Stomach is notable for a small hiatal hernia. No evidence of bowel obstruction. Appendix is not discretely visualized. Extensive left colonic diverticulosis. While the proximal sigmoid colonic wall thickening/inflammatory changes are improved,  there is now a 14 x 26 mm fluid and gas collection adjacent to the proximal sigmoid colon and beneath the left anterior abdominal wall (series 2/image 85), reflecting localized perforation. No free air. Vascular/Lymphatic: No evidence of abdominal aortic aneurysm. Atherosclerotic calcifications of the abdominal aorta and branch vessels. No suspicious abdominopelvic lymphadenopathy. Reproductive: Prostatomegaly. Other: No abdominopelvic ascites. Musculoskeletal: Grade 1 spondylolisthesis at L5-S1. Degenerative changes of the visualized thoracolumbar spine. IMPRESSION: Mild sigmoid diverticulitis, with overall improvement of wall thickening/inflammatory changes, but now with associated 2.6 cm pericolonic fluid and gas collection, reflecting localized perforation. No free air. Electronically Signed   By: Julian Hy M.D.   On: 02/26/2019 17:21     Labs:   Basic Metabolic Panel: Recent Labs  Lab 02/26/19 1441 02/26/19 2214 02/27/19 0654  NA 137  --  135  K 3.4*  --  3.5  CL 102  --  102  CO2 27  --  22  GLUCOSE 94  --  101*  BUN 13  --  9  CREATININE 1.00  --  0.94  CALCIUM 8.8*  --  8.2*  MG  --  2.0 2.0  PHOS  --   --  3.0   GFR Estimated Creatinine Clearance: 53.7 mL/min (by C-G formula based on SCr of 0.94 mg/dL). Liver Function Tests: Recent Labs  Lab 02/26/19 1441 02/27/19 0654  AST 17 18  ALT 21 20  ALKPHOS 53 44  BILITOT 0.7 0.6  PROT 7.0 6.0*  ALBUMIN 4.0 3.4*   No results for input(s): LIPASE, AMYLASE in the last 168 hours. No results for input(s): AMMONIA in the last 168 hours. Coagulation profile No results for input(s): INR, PROTIME in the last 168 hours.  CBC: Recent Labs  Lab 02/26/19 1441 02/27/19 0654  WBC 4.8 4.8  NEUTROABS 2.3  --   HGB 13.4 12.2*  HCT 40.8 36.5*  MCV 101.0* 99.2  PLT 150 146*   Cardiac Enzymes: No results for input(s): CKTOTAL, CKMB, CKMBINDEX, TROPONINI in the last 168 hours. BNP: Invalid input(s): POCBNP CBG: No  results for input(s): GLUCAP in the last 168 hours. D-Dimer No results for input(s): DDIMER in the last 72 hours. Hgb A1c No results for input(s): HGBA1C in the last 72 hours. Lipid Profile No results for input(s): CHOL, HDL, LDLCALC, TRIG, CHOLHDL, LDLDIRECT in the last 72 hours. Thyroid function studies Recent Labs    02/27/19 0654  TSH 23.744*   Anemia work up No results for input(s): VITAMINB12, FOLATE, FERRITIN, TIBC, IRON, RETICCTPCT in the last 72 hours. Microbiology Recent Results (from the past 240 hour(s))  SARS Coronavirus 2 [facility] order, Performed in [facility] hospital lab) Nasopharyngeal Nasopharyngeal Swab     Status: None   Collection Time: 02/19/19 12:40 PM   Specimen: Nasopharyngeal Swab  Result Value Ref Range Status   SARS Coronavirus 2 NEGATIVE NEGATIVE Final    Comment: (NOTE) If result is NEGATIVE SARS-CoV-2 target nucleic acids are NOT DETECTED. The SARS-CoV-2 RNA is generally detectable in upper and lower  respiratory specimens during the acute phase of infection. The lowest  concentration of SARS-CoV-2 viral copies this assay can detect is 250  copies / mL. A negative result does not preclude SARS-CoV-2 infection  and should not be used as the sole basis for treatment or other  patient management decisions.  A negative result may occur with  improper specimen collection / handling, submission of specimen other  than nasopharyngeal swab, presence of viral mutation(s) within the  areas targeted by this assay, and inadequate number of viral copies  (<250 copies / mL). A negative result must be combined with clinical  observations, patient history, and epidemiological information. If result is POSITIVE SARS-CoV-2 target nucleic acids are DETECTED. The SARS-CoV-2 RNA is generally detectable in upper and lower  respiratory specimens dur ing the acute phase of infection.  Positive  results are indicative of active infection with SARS-CoV-2.  Clinical   correlation with patient history and other diagnostic information is  necessary to determine patient infection status.  Positive results do  not rule out bacterial infection or co-infection with other viruses. If result is PRESUMPTIVE POSTIVE SARS-CoV-2 nucleic acids MAY BE PRESENT.   A presumptive positive result was obtained on the submitted specimen  and confirmed on repeat testing.  While 2019 novel coronavirus  (  SARS-CoV-2) nucleic acids may be present in the submitted sample  additional confirmatory testing may be necessary for epidemiological  and / or clinical management purposes  to differentiate between  SARS-CoV-2 and other Sarbecovirus currently known to infect humans.  If clinically indicated additional testing with an alternate test  methodology [phone]) is advised. The SARS-CoV-2 RNA is generally  detectable in upper and lower respiratory sp ecimens during the acute  phase of infection. The expected result is Negative. Fact Sheet for Patients:  StrictlyIdeas.no Fact Sheet for Healthcare Providers: BankingDealers.co.za This test is not yet approved or cleared by the [place] FDA and has been authorized for detection and/or diagnosis of SARS-CoV-2 by FDA under an Emergency Use Authorization (EUA).  This EUA will remain in effect (meaning this test can be used) for the duration of the COVID-19 declaration under Section 564(b)(1) of the Act, 21 U.S.C. section 360bbb-3(b)(1), unless the authorization is terminated or revoked sooner. Performed at [facility], [address] [place], [place] [address]   Culture, blood (routine x 2)     Status: None (Preliminary result)   Collection Time: 02/26/19  3:45 PM   Specimen: BLOOD  Result Value Ref Range Status   Specimen Description   Final    BLOOD RIGHT ANTECUBITAL Performed at [facility], [address] [place], [place] [address]    Special  Requests   Final    BOTTLES DRAWN AEROBIC AND ANAEROBIC Blood Culture adequate volume Performed at [facility], [address] [place], [place] [address]    Culture   Final    NO GROWTH 2 DAYS Performed at [facility], [place] [address], [place], [place] [address]    Report Status PENDING  Incomplete  Culture, blood (routine x 2)     Status: None (Preliminary result)   Collection Time: 02/26/19  3:54 PM   Specimen: BLOOD  Result Value Ref Range Status   Specimen Description   Final    BLOOD LEFT ANTECUBITAL Performed at [facility], [address] [place], [place] [address]    Special Requests   Final    BOTTLES DRAWN AEROBIC AND ANAEROBIC Blood Culture adequate volume Performed at [facility], [address] [place], [place] [address]    Culture   Final    NO GROWTH 2 DAYS Performed at [facility], [place] [address] [place], [place] [address]    Report Status PENDING  Incomplete  SARS Coronavirus 2 by RT PCR (hospital order, performed in [facility] hospital lab) Nasopharyngeal Nasopharyngeal Swab     Status: None   Collection Time: 02/26/19  6:05 PM   Specimen: Nasopharyngeal Swab  Result Value Ref Range Status   SARS Coronavirus 2 NEGATIVE NEGATIVE Final    Comment: (NOTE) If result is NEGATIVE SARS-CoV-2 target nucleic acids are NOT DETECTED. The SARS-CoV-2 RNA is generally detectable in upper and lower  respiratory specimens during the acute phase of infection. The lowest  concentration of SARS-CoV-2 viral copies this assay can detect is 250  copies / mL. A negative result does not preclude SARS-CoV-2 infection  and should not be used as the sole basis for treatment or other  patient management decisions.  A negative result may occur with  improper specimen collection / handling, submission of specimen other  than nasopharyngeal swab, presence of viral mutation(s) within the  areas targeted by this assay, and inadequate  number of viral copies  (<250 copies / mL). A negative result must be combined with clinical  observations, patient history, and epidemiological information. If result is POSITIVE SARS-CoV-2 target nucleic acids are DETECTED. The SARS-CoV-2 RNA is generally detectable in upper and lower  respiratory specimens dur ing the acute phase of infection.  Positive  results are indicative of active infection with SARS-CoV-2.  Clinical  correlation with patient history and other diagnostic information is  necessary to determine patient infection status.  Positive results do  not rule out bacterial infection or co-infection with other viruses. If result is PRESUMPTIVE POSTIVE SARS-CoV-2 nucleic acids MAY BE PRESENT.   A presumptive positive result was obtained on the submitted specimen  and confirmed on repeat testing.  While 2019 novel coronavirus  (SARS-CoV-2) nucleic acids may be present in the submitted sample  additional confirmatory testing may be necessary for epidemiological  and / or clinical management purposes  to differentiate between  SARS-CoV-2 and other Sarbecovirus currently known to infect humans.  If clinically indicated additional testing with an alternate test  methodology [phone]) is advised. The SARS-CoV-2 RNA is generally  detectable in upper and lower respiratory sp ecimens during the acute  phase of infection. The expected result is Negative. Fact Sheet for Patients:  StrictlyIdeas.no Fact Sheet for Healthcare Providers: BankingDealers.co.za This test is not yet approved or cleared by the [place] FDA and has been authorized for detection and/or diagnosis of SARS-CoV-2 by FDA under an Emergency Use Authorization (EUA).  This EUA will remain in effect (meaning this test can be used) for the duration of the COVID-19 declaration under Section 564(b)(1) of the Act, 21 U.S.C. section 360bbb-3(b)(1), unless the  authorization is terminated or revoked sooner. Performed at [facility], [address] [place], [place] [address]     Please note: You were cared for by a hospitalist during your hospital stay. Once you are discharged, your primary care physician will handle any further medical issues. Please note that NO REFILLS for any discharge medications will be authorized once you are discharged, as it is imperative that you return to your primary care physician (or establish a relationship with a primary care physician if you do not have one) for your post hospital discharge needs so that they can reassess your need for medications and monitor your lab values.  Signed: Terrilee Croak  [facility] 02/28/2019, 11:42 AM

## 2019-02-28 NOTE — Plan of Care (Signed)
  Problem: Education: Goal: Knowledge of General Education information will improve Description: Including pain rating scale, medication(s)/side effects and non-pharmacologic comfort measures Outcome: Progressing   Problem: Clinical Measurements: Goal: Cardiovascular complication will be avoided Outcome: Progressing   Problem: Activity: Goal: Risk for activity intolerance will decrease Outcome: Progressing   Problem: Coping: Goal: Level of anxiety will decrease Outcome: Progressing   Problem: Elimination: Goal: Will not experience complications related to urinary retention Outcome: Progressing   Problem: Safety: Goal: Ability to remain free from injury will improve Outcome: Progressing

## 2019-02-28 NOTE — Care Management Important Message (Signed)
Important Message  Patient Details IM Letter given to Velva Harman RN to present to the Patient Name: [place] H Garcialopez MRN: [id number] Date of Birth: [birth year]   Medicare Important Message Given:  Yes     Kerin Salen 02/28/2019, 11:54 AM

## 2019-02-28 NOTE — Progress Notes (Signed)
Subjective/Chief Complaint: No complaints   Objective: Vital signs in last 24 hours: Temp:  [98.1 F (36.7 C)-99 F (37.2 C)] 98.6 F (37 C) (10/16 1000) Pulse Rate:  [62-76] 62 (10/16 1000) Resp:  [16-24] 17 (10/16 1000) BP: (138-162)/(70-92) 142/76 (10/16 1000) SpO2:  [99 %-100 %] 100 % (10/16 1000) Last BM Date: 02/27/19  Intake/Output from previous day: 10/15 0701 - 10/16 0700 In: 886 [P.O.:730; IV Piggyback:36] Out: 900 [Urine:900] Intake/Output this shift: No intake/output data recorded.  General appearance: alert and cooperative Resp: clear to auscultation bilaterally Cardio: regular rate and rhythm GI: soft, non-tender; bowel sounds normal; no masses,  no organomegaly  Lab Results:  Recent Labs    02/26/19 1441 02/27/19 0654  WBC 4.8 4.8  HGB 13.4 12.2*  HCT 40.8 36.5*  PLT 150 146*   BMET Recent Labs    02/26/19 1441 02/27/19 0654  NA 137 135  K 3.4* 3.5  CL 102 102  CO2 27 22  GLUCOSE 94 101*  BUN 13 9  CREATININE 1.00 0.94  CALCIUM 8.8* 8.2*   PT/INR No results for input(s): LABPROT, INR in the last 72 hours. ABG No results for input(s): PHART, HCO3 in the last 72 hours.  Invalid input(s): PCO2, PO2  Studies/Results: Ct Abdomen Pelvis W Contrast  Result Date: 02/26/2019 CLINICAL DATA:  Abdominal pain, recent diverticulitis EXAM: CT ABDOMEN AND PELVIS WITH CONTRAST TECHNIQUE: Multidetector CT imaging of the abdomen and pelvis was performed using the standard protocol following bolus administration of intravenous contrast. CONTRAST:  151mL OMNIPAQUE IOHEXOL 300 MG/ML  SOLN COMPARISON:  02/19/2019 FINDINGS: Lower chest: Lung bases are clear. Hepatobiliary: Liver is within normal limits. Gallbladder is unremarkable. No hepatic or extrahepatic duct dilatation. Pancreas: Within normal limits. Spleen: Trace posterior perisplenic fluid (series 2/image 19), unchanged. Adrenals/Urinary Tract: Adrenal glands are within normal limits. Small  bilateral renal cysts, measuring up to 17 mm in the left lower kidney. No hydronephrosis. Bladder is within normal limits. Stomach/Bowel: Stomach is notable for a small hiatal hernia. No evidence of bowel obstruction. Appendix is not discretely visualized. Extensive left colonic diverticulosis. While the proximal sigmoid colonic wall thickening/inflammatory changes are improved, there is now a 14 x 26 mm fluid and gas collection adjacent to the proximal sigmoid colon and beneath the left anterior abdominal wall (series 2/image 85), reflecting localized perforation. No free air. Vascular/Lymphatic: No evidence of abdominal aortic aneurysm. Atherosclerotic calcifications of the abdominal aorta and branch vessels. No suspicious abdominopelvic lymphadenopathy. Reproductive: Prostatomegaly. Other: No abdominopelvic ascites. Musculoskeletal: Grade 1 spondylolisthesis at L5-S1. Degenerative changes of the visualized thoracolumbar spine. IMPRESSION: Mild sigmoid diverticulitis, with overall improvement of wall thickening/inflammatory changes, but now with associated 2.6 cm pericolonic fluid and gas collection, reflecting localized perforation. No free air. Electronically Signed   By: Julian Hy M.D.   On: 02/26/2019 17:21    Anti-infectives: Anti-infectives (From admission, onward)   Start     Dose/Rate Route Frequency Ordered Stop   02/28/19 1045  amoxicillin-clavulanate (AUGMENTIN) 875-125 MG per tablet 1 tablet     1 tablet Oral Every 12 hours 02/28/19 1032     02/26/19 2359  piperacillin-tazobactam (ZOSYN) IVPB 3.375 g     3.375 g 12.5 mL/hr over 240 Minutes Intravenous Every 8 hours 02/26/19 2217     02/26/19 1745  piperacillin-tazobactam (ZOSYN) IVPB 3.375 g     3.375 g 100 mL/hr over 30 Minutes Intravenous  Once 02/26/19 1734 02/26/19 1842      Assessment/Plan: s/p * No  surgery found * Advance diet  Switch to oral abx Follow up in 2 weeks. May get follow up ct as outpt before follow up   LOS: 2 days    Patrick [name] 02/28/2019

## 2019-02-28 NOTE — Telephone Encounter (Signed)
New Message   Patient has an appt on 10/27 with Truitt Merle that Roby Lofts would like treated as a TOC. Thank you

## 2019-02-28 NOTE — Progress Notes (Signed)
Progress Note  Patient Name: Patrick [name] Date of Encounter: 02/28/2019  Primary Cardiologist: Mertie Moores, MD   Subjective   No significant abdominal pain, hungry this morning. He had a solid bowel movement this morning as well. No chest pain or SOB  Inpatient Medications    Scheduled Meds: . allopurinol  300 mg Oral Daily  . aspirin EC  81 mg Oral Daily  . atorvastatin  20 mg Oral q1800  . clopidogrel  75 mg Oral Q breakfast  . donepezil  10 mg Oral QHS  . dorzolamide-timolol  1 drop Right Eye BID  . [START ON 03/01/2019] levothyroxine  100 mcg Oral QAC breakfast  . prednisoLONE acetate  1 drop Right Eye BID  . tamsulosin  0.4 mg Oral Daily   Continuous Infusions: . piperacillin-tazobactam (ZOSYN)  IV 3.375 g (02/27/19 2317)   PRN Meds: acetaminophen **OR** acetaminophen, hydrALAZINE, HYDROcodone-acetaminophen, morphine injection, ondansetron **OR** ondansetron (ZOFRAN) IV, verapamil   Vital Signs    Vitals:   02/27/19 1542 02/27/19 2038 02/28/19 0107 02/28/19 0512  BP: (!) 145/79 (!) 162/70 (!) 159/92 138/73  Pulse: 74 65 76 70  Resp: 16 16 16 16   Temp: 98.1 F (36.7 C) 99 F (37.2 C) 98.1 F (36.7 C) 98.4 F (36.9 C)  TempSrc: Oral Oral Oral Oral  SpO2: 99% 99% 99% 99%  Weight:      Height:        Intake/Output Summary (Last 24 hours) at 02/28/2019 0850 Last data filed at 02/28/2019 0600 Gross per 24 hour  Intake 886 ml  Output 775 ml  Net 111 ml   Last 3 Weights 02/26/2019 02/21/2019 02/20/2019  Weight (lbs) 157 lb 161 lb 6 oz 157 lb 4 oz  Weight (kg) 71.215 kg 73.2 kg 71.328 kg      Telemetry    N/A  ECG    N/A  Physical Exam   GEN: No acute distress.   Neck: No JVD Cardiac: RRR, no murmurs, rubs, or gallops.  Respiratory: Clear to auscultation bilaterally. GI: Soft, nontender, non-distended  MS: No edema; No deformity. Neuro:  Nonfocal  Psych: Normal affect   Labs    High Sensitivity Troponin:  No results for input(s):  TROPONINIHS in the last 720 hours.    Chemistry Recent Labs  Lab 02/26/19 1441 02/27/19 0654  NA 137 135  K 3.4* 3.5  CL 102 102  CO2 27 22  GLUCOSE 94 101*  BUN 13 9  CREATININE 1.00 0.94  CALCIUM 8.8* 8.2*  PROT 7.0 6.0*  ALBUMIN 4.0 3.4*  AST 17 18  ALT 21 20  ALKPHOS 53 44  BILITOT 0.7 0.6  GFRNONAA >60 >60  GFRAA >60 >60  ANIONGAP 8 11     Hematology Recent Labs  Lab 02/26/19 1441 02/27/19 0654  WBC 4.8 4.8  RBC 4.04* 3.68*  HGB 13.4 12.2*  HCT 40.8 36.5*  MCV 101.0* 99.2  MCH 33.2 33.2  MCHC 32.8 33.4  RDW 13.2 13.3  PLT 150 146*    BNPNo results for input(s): BNP, PROBNP in the last 168 hours.   DDimer No results for input(s): DDIMER in the last 168 hours.   Radiology    Ct Abdomen Pelvis W Contrast  Result Date: 02/26/2019 CLINICAL DATA:  Abdominal pain, recent diverticulitis EXAM: CT ABDOMEN AND PELVIS WITH CONTRAST TECHNIQUE: Multidetector CT imaging of the abdomen and pelvis was performed using the standard protocol following bolus administration of intravenous contrast. CONTRAST:  179mL OMNIPAQUE IOHEXOL  300 MG/ML  SOLN COMPARISON:  02/19/2019 FINDINGS: Lower chest: Lung bases are clear. Hepatobiliary: Liver is within normal limits. Gallbladder is unremarkable. No hepatic or extrahepatic duct dilatation. Pancreas: Within normal limits. Spleen: Trace posterior perisplenic fluid (series 2/image 19), unchanged. Adrenals/Urinary Tract: Adrenal glands are within normal limits. Small bilateral renal cysts, measuring up to 17 mm in the left lower kidney. No hydronephrosis. Bladder is within normal limits. Stomach/Bowel: Stomach is notable for a small hiatal hernia. No evidence of bowel obstruction. Appendix is not discretely visualized. Extensive left colonic diverticulosis. While the proximal sigmoid colonic wall thickening/inflammatory changes are improved, there is now a 14 x 26 mm fluid and gas collection adjacent to the proximal sigmoid colon and beneath  the left anterior abdominal wall (series 2/image 85), reflecting localized perforation. No free air. Vascular/Lymphatic: No evidence of abdominal aortic aneurysm. Atherosclerotic calcifications of the abdominal aorta and branch vessels. No suspicious abdominopelvic lymphadenopathy. Reproductive: Prostatomegaly. Other: No abdominopelvic ascites. Musculoskeletal: Grade 1 spondylolisthesis at L5-S1. Degenerative changes of the visualized thoracolumbar spine. IMPRESSION: Mild sigmoid diverticulitis, with overall improvement of wall thickening/inflammatory changes, but now with associated 2.6 cm pericolonic fluid and gas collection, reflecting localized perforation. No free air. Electronically Signed   By: Julian Hy M.D.   On: 02/26/2019 17:21    Cardiac Studies   Cath 11/18/2018 1.  Severe native vessel coronary artery disease with total occlusion of the RCA, total occlusion of left circumflex, and severe stenosis leading into total occlusion of the mid LAD 2.  Status post multivessel coronary bypass surgery with continued patency of the saphenous vein graft RCA, sequential saphenous vein graft to OM1 and distal circumflex, and LIMA to diagonal and mid LAD 3.  Severe stenosis involving the ostium/proximal portion of the SVG to distal RCA, treated successfully with a 3.5 x 20 mm Synergy DES utilizing distal embolic protection  Recommendations: Dual antiplatelet therapy with aspirin and clopidogrel 12 months as tolerated  Patient Profile     85 y.o. male with PMH of CAD s/p CABG 1997 with DES to [place] 11/2018, HTN, HLD, hypothyroidism, GERD, thrombocytopenia, and dementia who was seen for recommendation of BP medication.   Assessment & Plan    1. GI bleed:   - recent admission for diverticulitis with microperformation  - presented with abdominal pain. CT showed 2.6 cm pericolonic fluid collection  - reviewed by IR, fluid collection is too small and not amenable to percutaneous drainage  -  seen by general surgery, recommend continue antibiotic and advance diet.   - possible discharge today  2. CAD s/p recent PCI: given no plan for surgery, continue aspirin and plavix for now.   3. HTN: h/o orthostatic hypotension, florinef causes hypertension, this has been stopped. Imdur stopped given potent vasodilator and headache. Dr. Harrell Gave recommended PRN verapamil 40mg  for SBP > 160, so far, has not required any usage. SBP maintaining 130-150s. His wife can measure his BP twice a day at home   4. HLD: continue lipitor  5. Thrombocytopenia       For questions or updates, please contact [place] Please consult [email or web address] for contact info under        Signed, Almyra Deforest, [place]  02/28/2019, 8:50 AM

## 2019-02-28 NOTE — Consult Note (Signed)
   [facility] CM Inpatient Consult   02/28/2019  [place] H Landgren [birth year] [id number]    Patient chart has been reviewed for readmissions less than 30 days and 3 hospital admissions within past 6 months. Patient assessed for community [place] Management follow up needs.   Spoke with patient's spouse, Mrs. Reimann, and reviewed [facility] CM services. She had spoken with community RN Primary school teacher within past week triggered by EMMI responses. Patient's spouse states that Mr. Cowgill will be taking new medication and states that she would appreciate a community follow-up from RN case manager. Referral will be placed to [place] for community follow-up.  Of note, [facility] Care Management services does not replace or interfere with any services that are arranged by inpatient case management or social work.  Netta Cedars, MSN, [facility] Liaison Nurse Mobile Phone [phone]  Toll free office [phone]

## 2019-02-28 NOTE — Patient Outreach (Signed)
  [facility]) Care Management  02/28/2019  [place] H Rocque Feb 16, 1927 [id number]   Transition of Care Referral   Referral Date: 02/28/19 Referral Source: [place] hospital liaison, for Post hospital follow up Date of Admission: 02/26/19 Diagnosis: Coronary artery disease involving native coronary artery of native heart without angina  Date of Discharge: 02/28/19 home on a Soft diet for next 2 to 3 days, then advance to cardiac diet as tolerated. Facility: [facility]  Insurance: Medicare and Blue cross and Blue shield Supplement 3 hospital admissions within past 6 months hospitalized from 10/7-10/9 for sigmoid diverticulitis with microperforation but no abscess.   Conditions: Coronary artery disease involving native coronary artery of native heart without angina, CAD/CABG and recent PCI with stent placement in 11/2018, CKD-3,Gout, BPH thrombocytopenia, dementia, GERD, HLD, hypothyroidism, HTN, diverticulosis, hypokalemia, occult blood positive stool    DME Rolling walker, 3 n 1  Appointment 03/10/28 1530 cardiology Dr Grayland Jack Cecille Rubin Rosezetta Schlatter f/u  Outreach attempt # 1 unsuccessful  No answer. [facility] RN CM left HIPAA compliant voicemail message along with CM's contact info.   Plan: [facility] RN CM sent an unsuccessful outreach letter and scheduled this patient for another call attempt within 4 business days  Kimberly L. Lavina Hamman, RN, BSN, [place] Management Care Coordinator Direct Number [phone] Mobile number [phone]  Main [facility] number [phone] Fax number [phone]

## 2019-03-03 ENCOUNTER — Other Ambulatory Visit: Payer: Self-pay

## 2019-03-03 ENCOUNTER — Other Ambulatory Visit: Payer: Self-pay | Admitting: *Deleted

## 2019-03-03 ENCOUNTER — Encounter: Payer: Self-pay | Admitting: *Deleted

## 2019-03-03 LAB — CULTURE, BLOOD (ROUTINE X 2)
Culture: NO GROWTH
Culture: NO GROWTH
Special Requests: ADEQUATE
Special Requests: ADEQUATE

## 2019-03-03 NOTE — Patient Outreach (Signed)
[facility]) Care Management  03/03/2019  [place] H Cali 04/14/1927 [id number]   Telephone assessment  Referral Date: 02/28/19 Referral Source: [place] hospital liaison, for Post hospital follow up Date of Admission: 02/26/19 Diagnosis: Coronary artery disease involving native coronary artery of native heart without angina  Date of Discharge: 02/28/19 home on a Soft diet for next 2 to 3 days, then advance to cardiac diet as tolerated. Facility: [facility]  Insurance: Medicare and Blue cross and Blue shield Supplement 3 hospital admissions within past 6 months hospitalized from 10/7-10/9 for sigmoid diverticulitis with microperforation but no abscess. 11/14/18 to 11/19/18 for unstable angina, cardiac cath   Transition of care services noted to be completed by primary care MD office staff [facility]- Dr Marton Redwood. Transition of Care will be completed by primary care provider office who will refer to [facility] care management if needed.   Outreach attempt # 2 successfully to the home/mobile number  Patient and wife, [name] are able to verify HIPAA, DOB and Address Confirmed new address in [facility] as recently moved prior to last hospitalization from [place]  to [place] and addressed reason for coordination call to [facility] with patient Mr Stuckey gave permission for [facility] RN CM to speak with wife [name] Mrs Benajamin [place] is also listed on the designated party release from scanned in [place]    Mrs Campisano did share her frustrations with the recent hospital stay related to care and DME ([employer]). She was allowed time to ventilate her feelings She confirms he did get his DME after about 2 weeks of concerns She reports their BP cuff is not working properly per the EMS staff who examined Mr Ivey prior to taking to ED for last admission   [facility] RN CM discussed the upcoming Transition of care calls from Dr Brigitte Pulse office They  voiced understanding   [facility] RN CM completed brief transition of care questions and began assessing CAD home care   Mr and Mrs Galmore reports Mr Alyea is doing well now Denies any medical concerns during this call but agrees to further call assessment    Social: Mr Steege is a 85 year old retired male who lives at home with his wife, [name].  They deny concerns with his care needs and transportation to medical appointments at this time    Conditions: Coronary artery disease involving native coronary artery of native heart without angina, CAD/CABG and recent PCI with stent placement in 11/2018, CKD-3,Gout, BPH thrombocytopenia, dementia, GERD, HLD, hypothyroidism, HTN, diverticulosis, hypokalemia, occult blood positive stool    DME Rolling walker, 3 n 1, cane Medications: They denies concerns with taking medications as prescribed, affording medications, side effects of medications and questions about medications  Appointment 03/10/28 1530 cardiology Dr Grayland Jack Cecille Rubin Creta Levin TOC f/u Scheduled to have a zoom call with Dr Brigitte Pulse on today 03/03/19 at 1130 am   Advance Directives: Mr Cerbone has a living will and POA Consent: [facility] RN CM reviewed [facility] services with patient. Patient gave verbal consent for [facility] telephonic RN CM services.  Plan: [facility] RN CM will follow up with Mr & Mrs Shkolnik within the next 7-14 days for further assessment of cardiac/CAD care at home  Pt encouraged to return a call to [place] CM prn  [facility] RN CM discussed the outreach letter already sent on with [facility] brochure enclosed for review  Routed note to MDs  Joelene Millin L. Lavina Hamman, RN, BSN, CCM [facility]  Telephonic Care Management Care Coordinator Direct Number [phone] Mobile number [phone]  Main [facility] number [phone] Fax number [phone]

## 2019-03-04 NOTE — ED Provider Notes (Signed)
85 yo M with a chief complaints of worsening abdominal pain.  I received the patient in signout from Dr. Maryan Rued.  Briefly the patient had recently had a microperforation associated with diverticulitis.  Has been on antibiotics though has had worsening at home.  Was sent from his GI doctor.  Plan for a CT scan.  This shows a localized fluid collection.  I discussed the case with Dr. Ninfa Linden, general surgery he felt this was likely a localized abscess.  Recommended antibiotics and admission for possible IR consultation for drainage.  Started on Zosyn.  Admit.   Deno Etienne, DO 03/04/19 [phone]

## 2019-03-05 NOTE — Progress Notes (Signed)
CARDIOLOGY OFFICE NOTE  Date:  03/11/2019    Patrick [name] Date of Birth: 05-17-1926 Medical Record [id number]  PCP:  Marton Redwood, MD  Cardiologist:  [facility]  Chief Complaint  Patient presents with  . Follow-up    History of Present Illness: Patrick [name] is a 85 y.o. male who presents today for a follow up/post hospital visit.  Seen for Dr. Acie Fredrickson. He is a former patient of Dr. Susa Simmonds.   He has a known history of CAD with remote CABG from 29 (Dr. Redmond Pulling), HTN, HLD, hypothyroidism and GERD.   He was last seen by Dr. Acie Fredrickson in [place] 2020.   His wife reached out to me just before the July 4th holiday weekend - he was having marked SOB and unstable angina -had not been doing well for several weeks -he was referred for admission.His cardiac enzymes were negative for MI and his ECG was nonacute. An echocardiogram showed preserved left ventricular function with no wall motion abnormalities, butthere iscalciumon both mitral and aortic valves.   He underwent cardiac cath with Dr. Burt Knack on7/10/2018 withDES to the SVG-distal RCA. He was noted to have thrombocytopenia -withno bleeding issues. BP was elevated and his ARB was increased. He has resting bradycardia and is on beta blocker therapy- this was not changed.  His TSH was markedly low -he has had his dose of Synthroid reduced, then stopped and now back on increasing dose.   I have seen him as a work in a few times - he has developed marked orthostasis - not clear to me as to why - he is off most of his medicines - was placed on Florinef with some improvement. He saw Dr. Acie Fredrickson just a few weeks ago - cath films again reviewed - lots of native disease - trying to manage medically - he had been placed on Ranexa.  His wife reached out to me again last month - actually sent me pictures - he looked like he had fallen - had a left black eye - lots of bruising on his arms and legs - sleeping  more - overall decline noted and they were quite concerned. He is quite scared to die. They have put their house on the market and have bought a new home in [place]. 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 Has had some type of surgical treatment. Then developed belly pain - found to have diverticulitis - subsequent abscess - he has been hospitalized twice - no surgery - managed with antibiotics.   The patient does not have symptoms concerning for COVID-19 infection (fever, chills, cough, or new shortness of breath).   Comes in today. Here with his wife. He feels bad. Very tired. No energy. Crying. Angry. He hates feeling like this. Says he can't do anything. Has to stop and rest too much. He has no pain anywhere. Belly feels ok. Bowels working. Off Florinef. BP has been fine. Has not used any prn [place]. Seeing PCP tomorrow.  Thyroid dose was increased while he was in the hospital. Seems like he is back to his prior dose from several months ago.   Past Medical History:  Diagnosis Date  . BPH (benign prostatic hypertrophy)   . Diverticulosis   . ED (erectile dysfunction)   . GERD (gastroesophageal reflux disease)   . Gout   . Hepatitis    hx hepatitis after mono as teenager  . History of kidney stones   .  History of skin cancer   . Hyperlipidemia   . Hypertension   . Hypothyroidism   . IHD (ischemic heart disease)    Remote CABG in 1997  . MI, acute, non ST segment elevation ([place]) 2010   s/p cath with occluded SVG to PD that fills by collaterals and the remainder of his revasculsarization is satisfactory. "the first time they did the stent , the second time they did the graft 8 vessels"  . Nocturia   . Thrombocytopenia ([place]) 11/19/2018  . Urinary frequency     Past Surgical History:  Procedure Laterality Date  . APPENDECTOMY    . CARDIAC CATHETERIZATION  09/08/2008   NORMAL. EF 60%; Occluded SVG to PD that fills by collaterals and the remainder of his  revascularization is satisfactory. he is managed medically.   . CORONARY ARTERY BYPASS GRAFT  1997   CABG x 8  . CORONARY STENT INTERVENTION N/A 11/18/2018   Procedure: CORONARY STENT INTERVENTION;  Surgeon: Sherren Mocha, MD;  Location: [facility];  Service: Cardiovascular;  Laterality: N/A;  . CYSTOSCOPY WITH INSERTION OF UROLIFT N/A 03/19/2015   Procedure: CYSTOSCOPY WITH INSERTION OF FOUR UROLIFTS;  Surgeon: Carolan Clines, MD;  Location: [facility];  Service: Urology;  Laterality: N/A;  . ESOPHAGOGASTRODUODENOSCOPY (EGD) WITH PROPOFOL N/A 02/12/2017   Procedure: ESOPHAGOGASTRODUODENOSCOPY (EGD) WITH PROPOFOL;  Surgeon: Otis Brace, MD;  [facility]: [facility];  Service: Gastroenterology;  Laterality: N/A;  . ESOPHAGOGASTRODUODENOSCOPY (EGD) WITH PROPOFOL N/A 07/13/2018   Procedure: ESOPHAGOGASTRODUODENOSCOPY (EGD) WITH PROPOFOL;  Surgeon: Clarene Essex, MD;  Location: [facility];  Service: Endoscopy;  Laterality: N/A;  . FOREIGN BODY REMOVAL N/A 07/13/2018   Procedure: FOREIGN BODY REMOVAL;  Surgeon: Clarene Essex, MD;  Location: [facility];  Service: Endoscopy;  Laterality: N/A;  . LEFT HEART CATH AND CORS/GRAFTS ANGIOGRAPHY N/A 11/18/2018   Procedure: LEFT HEART CATH AND CORS/GRAFTS ANGIOGRAPHY;  Surgeon: Sherren Mocha, MD;  Location: [facility];  Service: Cardiovascular;  Laterality: N/A;     Medications: Current Meds  Medication Sig  . allopurinol (ZYLOPRIM) 300 MG tablet Take 300 mg by mouth daily.  Marland Kitchen aspirin 81 MG tablet Take 81 mg by mouth daily.    . clopidogrel (PLAVIX) 75 MG tablet Take 1 tablet (75 mg total) by mouth daily with breakfast.  . docusate sodium (COLACE) 100 MG capsule Take 1 capsule (100 mg total) by mouth daily as needed for mild constipation.  Marland Kitchen donepezil (ARICEPT) 10 MG tablet Take 10 mg by mouth at bedtime.   . dorzolamide-timolol (COSOPT) 22.3-6.8 MG/ML ophthalmic solution Place 1 drop into the right eye 2 (two) times daily. Does in  morning and at dinner time.  Marland Kitchen ipratropium (ATROVENT) 0.03 % nasal spray Place 2 sprays into both nostrils 2 (two) times daily.  Marland Kitchen levothyroxine (SYNTHROID) 100 MCG tablet Take 1 tablet (100 mcg total) by mouth daily before breakfast.  . nitroGLYCERIN (NITROSTAT) 0.4 MG SL tablet PLACE 1 TABLET (0.4 MG TOTAL) UNDER THE TONGUE EVERY 5 (FIVE) MINUTES AS NEEDED.  Marland Kitchen NONFORMULARY OR COMPOUNDED ITEM Elderly male with impaired mobility, Benefits from outpatient PT per physical therapy.  . polyethylene glycol (MIRALAX) 17 g packet Take 17 g by mouth daily as needed for moderate constipation.  . prednisoLONE acetate (PRED FORTE) 1 % ophthalmic suspension Place 1 drop into the right eye 2 (two) times daily.   . tamsulosin (FLOMAX) 0.4 MG CAPS capsule Take 0.4 mg by mouth daily.   . Vitamin D, Ergocalciferol, (DRISDOL) 50000 units  CAPS capsule Take 50,000 Units by mouth once a week.     Allergies: Allergies  Allergen Reactions  . Halcion [Triazolam]     "Makes me crazy"  . Pravachol     Unknown    Social History: The patient  reports that he quit smoking about 60 years ago. His smoking use included cigarettes. He has a 15.00 pack-year smoking history. He has never used smokeless tobacco. He reports current alcohol use. He reports that he does not use drugs.   Family History: The patient's family history includes Heart failure (age of onset: 21) in his mother.   Review of Systems: Please see the history of present illness.   All other systems are reviewed and negative.   Physical Exam: VS:  BP 134/72   Pulse 61   Ht 6' (1.829 m)   Wt 154 lb (69.9 kg)   SpO2 98%   BMI 20.89 kg/m  .  BMI Body mass index is 20.89 kg/m.  Wt Readings from Last 3 Encounters:  03/11/19 154 lb (69.9 kg)  02/26/19 157 lb (71.2 kg)  02/21/19 161 lb 6 oz (73.2 kg)    General: Alert. In no acute distress. A variety of emotions today - crying - angry. He has lost weight.   HEENT: Normal.  Neck: Supple, no  JVD, carotid bruits, or masses noted.  Cardiac: Regular rate and rhythm. No murmurs, rubs, or gallops. No edema.  Respiratory:  Lungs are clear to auscultation bilaterally with normal work of breathing.  GI: Soft and nontender.  MS: No deformity or atrophy. Gait and ROM intact.  Skin: Warm and dry. Color is normal.  Neuro:  Strength and sensation are intact and no gross focal deficits noted.  Psych: Alert, appropriate and with normal affect.   LABORATORY DATA:  EKG:  EKG is not ordered today.  Lab Results  Component Value Date   WBC 4.8 02/27/2019   HGB 12.2 (L) 02/27/2019   HCT 36.5 (L) 02/27/2019   PLT 146 (L) 02/27/2019   GLUCOSE 101 (H) 02/27/2019   CHOL 91 11/15/2018   TRIG 39 11/15/2018   HDL 42 11/15/2018   LDLCALC 41 11/15/2018   ALT 20 02/27/2019   AST 18 02/27/2019   NA 135 02/27/2019   K 3.5 02/27/2019   CL 102 02/27/2019   CREATININE 0.94 02/27/2019   BUN 9 02/27/2019   CO2 22 02/27/2019   TSH 23.744 (H) 02/27/2019   INR 1.1 09/08/2008       BNP (last 3 results) Recent Labs    11/14/18 1717  BNP 82.0    ProBNP (last 3 results) No results for input(s): PROBNP in the last 8760 hours.   Other Studies Reviewed Today:  CARDIAC CATH: 11/18/2018 1. Severe native vessel coronary artery disease with total occlusion of the RCA, total occlusion of left circumflex, and severe stenosis leading into total occlusion of the mid LAD 2. Status post multivessel coronary bypass surgery with continued patency of the saphenous vein graft RCA, sequential saphenous vein graft to OM1 and distal circumflex, and LIMA to diagonal and mid LAD 3. Severe stenosis involving the ostium/proximal portion of the SVG to distal RCA, treated successfully with a 3.5 x 20 mm Synergy DES utilizing distal embolic protection  Recommendations: Dual antiplatelet therapy with aspirin and clopidogrel 12 months as tolerated Intervention   ECHO: 11/15/2018  1. The left ventricle has  normal systolic function with an ejection fraction of 60-65%. The cavity size was normal. Left ventricular  diastolic Doppler parameters are consistent with impaired relaxation. Elevated left atrial and left ventricular  end-diastolic pressures The E/e' is >15. No evidence of left ventricular regional wall motion abnormalities. 2. Left atrial size was moderately dilated. 3. The mitral valve is abnormal. Mild thickening of the mitral valve leaflet. Mild calcification of the mitral valve leaflet. There is mild to moderate mitral annular calcification present. 4. The tricuspid valve is grossly normal. 5. The aortic valve is abnormal. Moderate calcification of the aortic valve. Aortic valve regurgitation is mild to moderate by color flow Doppler. No stenosis of the aortic valve.  Assessment/Plan:  1. Recent admission x 2 for diverticulitis with microperforation - managed with antibiotics. He has follow up CT later this week with visit with General Surgery next week. He seems better clinically. Still on antibiotics.   2. CAD s/p recent PCI/DES to theSVG-distal RCA- sounds like his angina is now resolved.Unfortunately, his PCI has not really improved his fatigue.Has extensive diffuse native CAD - no further options felt to be needed for revascularization - trying to manage medically and overall a difficult situation - he has been placed on Ranexa - given no plan for surgery, continue aspirin and plavix for now.   3. HTN: h/o orthostatic hypotension - he is now off Florinef. BP ok so far and has not used any prn CCB therapy.   4. HLD: on statin  5. Thrombocytopenia  6. Profound fatigue- still profound.   7. Markedly abnormal TSH -managed by PCP  8. Memory disorder- has been on Aricept for about a year now.I wonder how much depression is playing a role here as well. I think he was on SSRI therapy many years ago - this may need to be considered. He is seeing Dr. Brigitte Pulse tomorrow.   9.  COVID-19 Education: The signs and symptoms of COVID-19 were discussed with the patient and how to seek care for testing (follow up with PCP or arrange E-visit).  The importance of social distancing, staying at home, hand hygiene and wearing a mask when out in public were discussed today.  Current medicines are reviewed with the patient today.  The patient does not have concerns regarding medicines other than what has been noted above.  The following changes have been made:  See above.  Labs/ tests ordered today include:   No orders of the defined types were placed in this encounter.    Disposition:   FU with me in 6 to 8 weeks.   Patient is agreeable to this plan and will call if any problems develop in the interim.   SignedTruitt Merle, NP  03/11/2019 4:49 PM  [facility] [address] [place], [place]  [address] Phone: [phone] Fax: [phone]

## 2019-03-06 ENCOUNTER — Other Ambulatory Visit: Payer: Self-pay | Admitting: General Surgery

## 2019-03-06 DIAGNOSIS — K5780 Diverticulitis of intestine, part unspecified, with perforation and abscess without bleeding: Secondary | ICD-10-CM

## 2019-03-10 ENCOUNTER — Other Ambulatory Visit: Payer: Self-pay

## 2019-03-10 ENCOUNTER — Other Ambulatory Visit: Payer: Self-pay | Admitting: *Deleted

## 2019-03-10 NOTE — Patient Outreach (Addendum)
Patrick [name]) Care Management  03/10/2019  Patrick [name] [birth year] [id number]   Follow up Telephone assessment  Recent referral Date:02/28/19 Referral Source:[facility] hospital liaison, forPost hospital follow up Date of Admission:02/26/19 Diagnosis:Coronary artery disease involving native coronary artery of native heart without angina Date of Discharge:02/28/19 home on aSoft diet for next 2 to 3 days, then advance to cardiac diet as tolerated. [facility] Insurance:Medicare and Blue cross and Blue shield Supplement 3 hospital admissions within past 6 months hospitalized from 10/7-10/9 for sigmoid diverticulitis with microperforation but no abscess. 11/14/18 to 11/19/18 for unstable angina, cardiac cath   Patrick [name] has not been hospitalized or in the ED in the last 10 days  Outreach attempt # 3 successfully to the home/mobile number  Wife, Patrick [name] are able to verify HIPAA, DOB and Address Patrick [name] is also listed on the designated party release from scanned in [place]  Patrick [name] has dementia   Patrick [name] did share again her frustrations with the recent hospital stay related to care and DME ([employer]). She was allowed time to ventilate her feelings She was given [facility] Patient Experience number [phone]  She denies need of DME   BP cuff  She reports their BP cuff is not working properly per the EMS staff who examined Patrick [name] prior to taking to ED for last admission  [facility] RN CM confirms medicare does not cover BP cuffs.She reports paying $49 for an [facility] from [facility] that was reported with an erroneous value during the recent EMS visit.    Patrick [name] reports Patrick [name] is doing well now and "hanging in there" Denies any medical concerns during this call but agrees to further call assessment   Diverticulosis [facility] RN CM and Patrick [name] viewed the reasons for Patrick [name] recent 3 admissions. Patrick [name] states she  believes the first visit as related to his heart but the last 2 were related to his diverticulosis and mobility issues "he could hardly walk" His stools had been "jet black" are now brown with a few loose stools [facility] RN CM discussed the high fiber diet, low fiber diet and monitoring fluids to maintain hydration especially when Patrick [name] reports "he has never liked to drink a lot"   He is scheduled to be seen for a abdominal CT this week. A follow up appointment with the primary care MD and a visit to the cardiologist on 03/11/19  Patrick [name] agrees to Educational materials on diverticulosis, low fiber diet, high fiber diet, diverticulitis discharge instructions and perforation of the GI tract discharge instructions.  Social: Patrick [name] is a 85 year old retired male who lives at home with his wife, Patrick [name].  They deny concerns with his care needs and transportation to medical appointments at this time    Conditions:Coronary artery disease involving native coronary artery of native heart without angina,CAD/CABG and recent PCI with stent placement in 11/2018, CKD-3,Gout, BPH thrombocytopenia, dementia, GERD, HLD, hypothyroidism, HTN, diverticulosis, hypokalemia, occult blood positive stool    DMERolling walker, 3 n 1, cane Medications: They denies concerns with taking medications as prescribed, affording medications, side effects of medications and questions about medications  Appointment10/27/29 1530 cardiology Patrick [name] TOC f/u Had a zoom call with Patrick [name] on 03/03/19 at 1130 am f/u 03/12/19  Advance Directives: Patrick [name] has a living will and POA  Consent: [facility] RN CM reviewed Patrick [name] services with patient. Patient gave verbal consent for  [facility] telephonic RN CM services.  Plan: [facility] RN CM will follow up with Patrick [name] within the next 7-14 days for further assessment of cardiac/CAD care at home  Pt encouraged to return a call to [facility] RN CM prn  Routed note to  MDs  [place] Problem One     Most Recent Value  Care Plan Problem One  Risk for hospital readmission secondary to recent admission for CAD  Role Documenting the Problem One  Care Management Telephonic Coordinator  Care Plan for Problem One  Active  [facility] Long Term Goal   Over the next 31 days, patient will not experience hospital readmission, as evidenced by patient reporting and review of EMR during [name] RN CCM outreach  [place] Term Goal Start Date  03/03/19  Interventions for Problem One Long Term Goal  Assessed for cardiac and diverticulosis signs adn symptoms  [facility] CM Short Term Goal #1   Over the next 7 days, patient will schedule hospital follow up office visit appointments with cardiologist and PCP, as evidenced by patient reporting during [facility] RN CCM outreach  [facility] CM Short Term Goal #1 Start Date  03/03/19  [facility] CM Short Term Goal #1 Met Date  03/10/19  [facility] CM Short Term Goal #2   over the next 21 days EMMI materials will be received and reviewed to assist with home care during a follow up call   [facility]N CM Short Term Goal #2 Start Date  03/10/19  Interventions for Short Term Goal #2  assessed for divericulosis symptoms, discussed diet , sent EMMI education on low fiber diet, diverticulosis perforation of teh GI tract discharge instructions and diverticulitis      Patrick Cil[place] L. Lavina Hamman, RN, BSN, [place] Management Care Coordinator Direct Number [phone] Mobile number [phone]  Main [facility] number [phone] Fax number [phone]

## 2019-03-11 ENCOUNTER — Encounter: Payer: Self-pay | Admitting: Nurse Practitioner

## 2019-03-11 ENCOUNTER — Ambulatory Visit (INDEPENDENT_AMBULATORY_CARE_PROVIDER_SITE_OTHER): Payer: Medicare Other | Admitting: Nurse Practitioner

## 2019-03-11 VITALS — BP 134/72 | HR 61 | Ht 72.0 in | Wt 154.0 lb

## 2019-03-11 DIAGNOSIS — E782 Mixed hyperlipidemia: Secondary | ICD-10-CM | POA: Diagnosis not present

## 2019-03-11 DIAGNOSIS — Z7189 Other specified counseling: Secondary | ICD-10-CM | POA: Diagnosis not present

## 2019-03-11 DIAGNOSIS — K5720 Diverticulitis of large intestine with perforation and abscess without bleeding: Secondary | ICD-10-CM | POA: Diagnosis not present

## 2019-03-11 DIAGNOSIS — I10 Essential (primary) hypertension: Secondary | ICD-10-CM

## 2019-03-11 DIAGNOSIS — R5383 Other fatigue: Secondary | ICD-10-CM | POA: Diagnosis not present

## 2019-03-11 DIAGNOSIS — I2510 Atherosclerotic heart disease of native coronary artery without angina pectoris: Secondary | ICD-10-CM

## 2019-03-11 DIAGNOSIS — I200 Unstable angina: Secondary | ICD-10-CM

## 2019-03-11 NOTE — Patient Instructions (Signed)
After Visit Summary:  We will be checking the following labs today - NONE   Medication Instructions:    Continue with your current medicines.    If you need a refill on your cardiac medications before your next appointment, please call your pharmacy.     Testing/Procedures To Be Arranged:  N/A  Follow-Up:   See me in about 6 to 8 weeks    At [facility], you and your health needs are our priority.  As part of our continuing mission to provide you with exceptional heart care, we have created designated Provider Care Teams.  These Care Teams include your primary Cardiologist (physician) and Advanced Practice Providers (APPs -  Physician Assistants and Nurse Practitioners) who all work together to provide you with the care you need, when you need it.  Special Instructions:  . Stay safe, stay home, wash your hands for at least 20 seconds and wear a mask when out in public.  . It was good to talk with you today.    Call the [place] office at [phone] if you have any questions, problems or concerns.

## 2019-03-12 ENCOUNTER — Other Ambulatory Visit: Payer: Self-pay | Admitting: Internal Medicine

## 2019-03-12 DIAGNOSIS — G609 Hereditary and idiopathic neuropathy, unspecified: Secondary | ICD-10-CM | POA: Diagnosis not present

## 2019-03-12 DIAGNOSIS — K651 Peritoneal abscess: Secondary | ICD-10-CM | POA: Diagnosis not present

## 2019-03-12 DIAGNOSIS — K5792 Diverticulitis of intestine, part unspecified, without perforation or abscess without bleeding: Secondary | ICD-10-CM | POA: Diagnosis not present

## 2019-03-12 DIAGNOSIS — R634 Abnormal weight loss: Secondary | ICD-10-CM

## 2019-03-12 DIAGNOSIS — F329 Major depressive disorder, single episode, unspecified: Secondary | ICD-10-CM | POA: Diagnosis not present

## 2019-03-12 DIAGNOSIS — E46 Unspecified protein-calorie malnutrition: Secondary | ICD-10-CM | POA: Diagnosis not present

## 2019-03-12 DIAGNOSIS — E039 Hypothyroidism, unspecified: Secondary | ICD-10-CM | POA: Diagnosis not present

## 2019-03-12 DIAGNOSIS — E538 Deficiency of other specified B group vitamins: Secondary | ICD-10-CM | POA: Diagnosis not present

## 2019-03-13 ENCOUNTER — Ambulatory Visit
Admission: RE | Admit: 2019-03-13 | Discharge: 2019-03-13 | Disposition: A | Discharge status: Home / Self Care | Payer: Medicare Other | Source: Ambulatory Visit | Attending: General Surgery | Admitting: General Surgery

## 2019-03-13 ENCOUNTER — Ambulatory Visit
Admission: RE | Admit: 2019-03-13 | Discharge: 2019-03-13 | Disposition: A | Discharge status: Home / Self Care | Payer: Medicare Other | Source: Ambulatory Visit | Attending: Internal Medicine | Admitting: Internal Medicine

## 2019-03-13 DIAGNOSIS — R918 Other nonspecific abnormal finding of lung field: Secondary | ICD-10-CM | POA: Diagnosis not present

## 2019-03-13 DIAGNOSIS — N200 Calculus of kidney: Secondary | ICD-10-CM | POA: Diagnosis not present

## 2019-03-13 DIAGNOSIS — K5780 Diverticulitis of intestine, part unspecified, with perforation and abscess without bleeding: Secondary | ICD-10-CM

## 2019-03-13 DIAGNOSIS — R634 Abnormal weight loss: Secondary | ICD-10-CM

## 2019-03-13 MED ORDER — IOPAMIDOL (ISOVUE-300) INJECTION 61%
100.0000 mL | Freq: Once | INTRAVENOUS | Status: AC | PRN
  Administered 2019-03-13: 100 mL via INTRAVENOUS

## 2019-03-14 ENCOUNTER — Other Ambulatory Visit: Payer: Self-pay | Admitting: *Deleted

## 2019-03-14 NOTE — Patient Outreach (Signed)
  Patrick [name] Patrick [name]) Care Management  03/14/2019  Patrick [name] 03-20-1927 [id number]   Care coordination  [facility] RN CM reviewed Patrick [name] with Patrick [name] multidisciplinary team.  Recommendations to review support systems, review replacement or BP cuff (Patrick [name]  Offers same BP monitor the wife purchased), and review need of home health services (PT)  Plan  [name] RN CMwill follow up with Patrick [name] within the next 7-14 days for further assessment of cardiac/CAD care at home  Patrick [name]. Patrick Hamman, RN, BSN, [place] Coordinator Office number [phone] Mobile number [phone]  Main Patrick [name] number [phone] Fax number [phone]

## 2019-03-17 ENCOUNTER — Other Ambulatory Visit: Payer: Self-pay | Admitting: *Deleted

## 2019-03-17 ENCOUNTER — Other Ambulatory Visit: Payer: Self-pay

## 2019-03-17 NOTE — Patient Outreach (Signed)
[place] Patrick [name]) Care Management  03/17/2019  Patrick [name] 01/05/1927 [id number]   Follow up Telephone assessment  Recent referral Date:02/28/19 Referral Source:[place] hospital liaison, forPost hospital follow up Date of Admission:02/26/19 Diagnosis:Coronary artery disease involving native coronary artery of native heart without angina Date of Discharge:02/28/19 home on aSoft diet for next 2 to 3 days, then advance to cardiac diet as tolerated. Facility:[facility] Insurance:Medicare and Blue cross and Blue shield Supplement 3 hospital admissions within past 6 months hospitalized from 10/7-10/9 for sigmoid diverticulitis with microperforation but no abscess. 11/14/18 to 11/19/18 for unstable angina, cardiac cath  Patrick [name] has not been hospitalized or in the ED in the last 10 days  Outreach attempt unsuccessfully to the home/mobile number Patrick [name] reports they are about to walk in to see a Doctor and requests a return call    Social:Patrick [name] is a 85 year old retired male who lives at home with his wife, Patrick [name]. They deny concerns with his care needs and transportation to medical appointments at this time    Conditions:Coronary artery disease involving native coronary artery of native heart without angina,CAD/CABG and recent PCI with stent placement in 11/2018, CKD-3,Gout, BPH thrombocytopenia, dementia, GERD, HLD, hypothyroidism, HTN, diverticulosis, hypokalemia, occult blood positive stool    DMERolling walker, 3 n 1, cane Consent:[facility] RN CM reviewed [facility] services with patient. Patient gave verbal consent for [facility] telephonic RN CM services.  Plan: Patrick [name] follow up with Patrick [name] within the next 7-14 days for further assessment of cardiac/CAD care at home  Patrick [name]. Patrick Hamman, RN, BSN, [place] Coordinator Office number [phone] Mobile number [phone]  Main  [facility] number [phone] Fax number [phone]

## 2019-03-17 NOTE — Patient Outreach (Addendum)
[facility]) Care Management  03/17/2019  [place] H Cuda 12-11-1926 [id number]   Follow upTelephone assessment  Recent referral Date:02/28/19 Referral Source:[place] hospital liaison, forPost hospital follow up Date of Admission:02/26/19 Diagnosis:Coronary artery disease involving native coronary artery of native heart without angina Date of Discharge:02/28/19 home on aSoft diet for next 2 to 3 days, then advance to cardiac diet as tolerated. Facility: hospital Insurance:Medicare and Blue cross and Blue shield Supplement 3 hospital admissions within past 6 months hospitalized from 10/7-10/9 for sigmoid diverticulitis with microperforation but no abscess. 11/14/18 to 11/19/18 for unstable angina, cardiac cath  Mr Filardi has not been hospitalized or in the ED in the last 10 days  Outreach attempt unsuccessfully to the home/mobile number that is listed for Mr [place] and this is the same number listed for Mrs Stogsdill's home and mobile numbers  The voice mail box is full and [facility] RN CM is not able to leave a message   Plan: [place] follow up with Mr & Mrs Burghart within the next 7-14 days for further assessment of cardiac/CAD care at home  [place]. Lavina Hamman, RN, BSN, [place] Coordinator Office number [phone] Mobile number [phone]  Main [facility] number [phone] Fax number [phone]

## 2019-03-18 DIAGNOSIS — K5780 Diverticulitis of intestine, part unspecified, with perforation and abscess without bleeding: Secondary | ICD-10-CM | POA: Diagnosis not present

## 2019-03-19 ENCOUNTER — Ambulatory Visit: Payer: Medicare Other | Admitting: Cardiovascular Disease

## 2019-03-20 ENCOUNTER — Other Ambulatory Visit: Payer: Self-pay | Admitting: *Deleted

## 2019-03-20 DIAGNOSIS — H5702 Anisocoria: Secondary | ICD-10-CM | POA: Diagnosis not present

## 2019-03-20 NOTE — Patient Outreach (Addendum)
[facility]) Care Management  03/20/2019  [place] H Bodie January 07, 1927 [id number]   Follow upTelephone assessment  Recent referral Date:02/28/19 Referral Source:[facility] hospital liaison, forPost hospital follow up Date of Admission:02/26/19 Diagnosis:Coronary artery disease involving native coronary artery of native heart without angina Date of Discharge:02/28/19 home on aSoft diet for next 2 to 3 days, then advance to cardiac diet as tolerated. Facility:[facility] Insurance:Medicare and Blue cross and Blue shield Supplement 3 hospital admissions within past 6 months hospitalized from 10/7-10/9 for sigmoid diverticulitis with microperforation but no abscess. 11/14/18 to 11/19/18 for unstable angina, cardiac cath  Mr Barbie has not been hospitalized or in the ED in the last 10 days  Outreach attemptunsuccessfully to the home/mobile number that is listed for Mr Grahmann and this is the same number listed for Mrs Newill's home and mobile numbers - To update on BP cuff giving committee resource and follow up on [facility] multidisciplinary team recommendations The voice mail box is full and [facility] RN CM is not able to leave a message  [facility] RN CM sent a text message   Plan: [facility] RN CMwill follow up with Mr & Mrs Runde within the next 7-14 days for further assessment of cardiac/CAD care at home  Mr Lebrecht was sent a [facility] engaged patient unsuccessful outreach letter  [facility] RN CM to updated [facility] multidisciplinary team Routed note to MD  [place]. Lavina Hamman, RN, BSN, [place] Coordinator Office number [phone] Mobile number [phone]  Main [facility] number [id number] Fax number [phone]

## 2019-03-25 ENCOUNTER — Emergency Department (HOSPITAL_COMMUNITY): Payer: Medicare Other

## 2019-03-25 ENCOUNTER — Emergency Department (HOSPITAL_COMMUNITY): Payer: Medicare Other | Admitting: Anesthesiology

## 2019-03-25 ENCOUNTER — Encounter (HOSPITAL_COMMUNITY): Payer: Self-pay | Admitting: Emergency Medicine

## 2019-03-25 ENCOUNTER — Emergency Department (HOSPITAL_COMMUNITY)
Admission: EM | Admit: 2019-03-25 | Discharge: 2019-03-25 | Disposition: A | Discharge status: Home / Self Care | Payer: Medicare Other | Attending: Emergency Medicine | Admitting: Emergency Medicine

## 2019-03-25 ENCOUNTER — Encounter (HOSPITAL_COMMUNITY)
Admission: EM | Disposition: A | Discharge status: Home / Self Care | Payer: Self-pay | Source: Home / Self Care | Attending: Emergency Medicine

## 2019-03-25 ENCOUNTER — Other Ambulatory Visit: Payer: Self-pay

## 2019-03-25 DIAGNOSIS — E039 Hypothyroidism, unspecified: Secondary | ICD-10-CM | POA: Insufficient documentation

## 2019-03-25 DIAGNOSIS — K449 Diaphragmatic hernia without obstruction or gangrene: Secondary | ICD-10-CM | POA: Insufficient documentation

## 2019-03-25 DIAGNOSIS — K2970 Gastritis, unspecified, without bleeding: Secondary | ICD-10-CM | POA: Insufficient documentation

## 2019-03-25 DIAGNOSIS — Z87891 Personal history of nicotine dependence: Secondary | ICD-10-CM | POA: Diagnosis not present

## 2019-03-25 DIAGNOSIS — Z85828 Personal history of other malignant neoplasm of skin: Secondary | ICD-10-CM | POA: Diagnosis not present

## 2019-03-25 DIAGNOSIS — Z951 Presence of aortocoronary bypass graft: Secondary | ICD-10-CM | POA: Insufficient documentation

## 2019-03-25 DIAGNOSIS — Z955 Presence of coronary angioplasty implant and graft: Secondary | ICD-10-CM | POA: Insufficient documentation

## 2019-03-25 DIAGNOSIS — J9811 Atelectasis: Secondary | ICD-10-CM | POA: Diagnosis not present

## 2019-03-25 DIAGNOSIS — I10 Essential (primary) hypertension: Secondary | ICD-10-CM | POA: Diagnosis not present

## 2019-03-25 DIAGNOSIS — T18128A Food in esophagus causing other injury, initial encounter: Secondary | ICD-10-CM | POA: Diagnosis not present

## 2019-03-25 DIAGNOSIS — Z79899 Other long term (current) drug therapy: Secondary | ICD-10-CM | POA: Insufficient documentation

## 2019-03-25 DIAGNOSIS — Z7982 Long term (current) use of aspirin: Secondary | ICD-10-CM | POA: Diagnosis not present

## 2019-03-25 DIAGNOSIS — K2980 Duodenitis without bleeding: Secondary | ICD-10-CM | POA: Diagnosis not present

## 2019-03-25 DIAGNOSIS — Y999 Unspecified external cause status: Secondary | ICD-10-CM | POA: Insufficient documentation

## 2019-03-25 DIAGNOSIS — K759 Inflammatory liver disease, unspecified: Secondary | ICD-10-CM | POA: Diagnosis not present

## 2019-03-25 DIAGNOSIS — K222 Esophageal obstruction: Secondary | ICD-10-CM | POA: Diagnosis not present

## 2019-03-25 DIAGNOSIS — Z20828 Contact with and (suspected) exposure to other viral communicable diseases: Secondary | ICD-10-CM | POA: Diagnosis not present

## 2019-03-25 DIAGNOSIS — Y929 Unspecified place or not applicable: Secondary | ICD-10-CM | POA: Insufficient documentation

## 2019-03-25 DIAGNOSIS — Y9389 Activity, other specified: Secondary | ICD-10-CM | POA: Diagnosis not present

## 2019-03-25 DIAGNOSIS — K219 Gastro-esophageal reflux disease without esophagitis: Secondary | ICD-10-CM | POA: Diagnosis not present

## 2019-03-25 DIAGNOSIS — I252 Old myocardial infarction: Secondary | ICD-10-CM | POA: Diagnosis not present

## 2019-03-25 DIAGNOSIS — Z7902 Long term (current) use of antithrombotics/antiplatelets: Secondary | ICD-10-CM | POA: Diagnosis not present

## 2019-03-25 DIAGNOSIS — I259 Chronic ischemic heart disease, unspecified: Secondary | ICD-10-CM | POA: Insufficient documentation

## 2019-03-25 DIAGNOSIS — D696 Thrombocytopenia, unspecified: Secondary | ICD-10-CM | POA: Diagnosis not present

## 2019-03-25 DIAGNOSIS — Z03818 Encounter for observation for suspected exposure to other biological agents ruled out: Secondary | ICD-10-CM | POA: Diagnosis not present

## 2019-03-25 DIAGNOSIS — R0989 Other specified symptoms and signs involving the circulatory and respiratory systems: Secondary | ICD-10-CM | POA: Diagnosis present

## 2019-03-25 DIAGNOSIS — X58XXXA Exposure to other specified factors, initial encounter: Secondary | ICD-10-CM | POA: Diagnosis not present

## 2019-03-25 HISTORY — PX: ESOPHAGOGASTRODUODENOSCOPY (EGD) WITH PROPOFOL: SHX5813

## 2019-03-25 HISTORY — PX: BALLOON DILATION: SHX5330

## 2019-03-25 LAB — BASIC METABOLIC PANEL
Anion gap: 8 (ref 5–15)
BUN: 21 mg/dL (ref 8–23)
CO2: 25 mmol/L (ref 22–32)
Calcium: 8.8 mg/dL — ABNORMAL LOW (ref 8.9–10.3)
Chloride: 106 mmol/L (ref 98–111)
Creatinine, Ser: 1.16 mg/dL (ref 0.61–1.24)
GFR calc Af Amer: >60 mL/min (ref >60)
GFR calc non Af Amer: 55 mL/min — ABNORMAL LOW (ref >60)
Glucose, Bld: 99 mg/dL (ref 70–99)
Potassium: 4.1 mmol/L (ref 3.5–5.1)
Sodium: 139 mmol/L (ref 135–145)

## 2019-03-25 LAB — CBC WITH DIFFERENTIAL/PLATELET
Abs Immature Granulocytes: 0.2 10*3/uL — ABNORMAL HIGH (ref 0.00–0.07)
Basophils Absolute: 0 10*3/uL (ref 0.0–0.1)
Basophils Relative: 0 %
Eosinophils Absolute: 0 10*3/uL (ref 0.0–0.5)
Eosinophils Relative: 1 %
HCT: 42.3 % (ref 39.0–52.0)
Hemoglobin: 13.6 g/dL (ref 13.0–17.0)
Immature Granulocytes: 5 %
Lymphocytes Relative: 23 %
Lymphs Abs: 0.9 10*3/uL (ref 0.7–4.0)
MCH: 33.1 pg (ref 26.0–34.0)
MCHC: 32.2 g/dL (ref 30.0–36.0)
MCV: 102.9 fL — ABNORMAL HIGH (ref 80.0–100.0)
Monocytes Absolute: 0.9 10*3/uL (ref 0.1–1.0)
Monocytes Relative: 23 %
Neutro Abs: 1.9 10*3/uL (ref 1.7–7.7)
Neutrophils Relative %: 48 %
Platelets: 93 10*3/uL — ABNORMAL LOW (ref 150–400)
RBC: 4.11 MIL/uL — ABNORMAL LOW (ref 4.22–5.81)
RDW: 13.3 % (ref 11.5–15.5)
WBC: 3.9 10*3/uL — ABNORMAL LOW (ref 4.0–10.5)
nRBC: 0 % (ref 0.0–0.2)

## 2019-03-25 LAB — SARS CORONAVIRUS 2 BY RT PCR (HOSPITAL ORDER, PERFORMED IN ~~LOC~~ HOSPITAL LAB): SARS Coronavirus 2: NEGATIVE

## 2019-03-25 LAB — PROTIME-INR
INR: 1.1 (ref 0.8–1.2)
Prothrombin Time: 14.5 seconds (ref 11.4–15.2)

## 2019-03-25 SURGERY — ESOPHAGOGASTRODUODENOSCOPY (EGD) WITH PROPOFOL
Anesthesia: General

## 2019-03-25 MED ORDER — LACTATED RINGERS IV SOLN
INTRAVENOUS | Status: DC
  Administered 2019-03-25: 15:00:00 via INTRAVENOUS

## 2019-03-25 MED ORDER — PROPOFOL 10 MG/ML IV BOLUS
INTRAVENOUS | Status: DC | PRN
  Administered 2019-03-25: 100 mg via INTRAVENOUS

## 2019-03-25 MED ORDER — PANTOPRAZOLE SODIUM 40 MG PO TBEC: 40.0000 mg | DELAYED_RELEASE_TABLET | Freq: Every day | ORAL | 1 refills | Status: DC

## 2019-03-25 MED ORDER — PROPOFOL 500 MG/50ML IV EMUL
INTRAVENOUS | Status: AC
  Filled 2019-03-25: qty 50

## 2019-03-25 MED ORDER — LIDOCAINE 2% (20 MG/ML) 5 ML SYRINGE
INTRAMUSCULAR | Status: DC | PRN
  Administered 2019-03-25: 80 mg via INTRAVENOUS

## 2019-03-25 MED ORDER — ONDANSETRON HCL 4 MG/2ML IJ SOLN
INTRAMUSCULAR | Status: DC | PRN
  Administered 2019-03-25: 4 mg via INTRAVENOUS

## 2019-03-25 MED ORDER — PANTOPRAZOLE SODIUM 40 MG IV SOLR
40.0000 mg | Freq: Once | INTRAVENOUS | Status: AC
  Administered 2019-03-25: 40 mg via INTRAVENOUS
  Filled 2019-03-25: qty 40

## 2019-03-25 MED ORDER — SUCCINYLCHOLINE CHLORIDE 200 MG/10ML IV SOSY
PREFILLED_SYRINGE | INTRAVENOUS | Status: DC | PRN
  Administered 2019-03-25: 100 mg via INTRAVENOUS

## 2019-03-25 MED ORDER — GLUCAGON HCL RDNA (DIAGNOSTIC) 1 MG IJ SOLR
1.0000 mg | Freq: Once | INTRAMUSCULAR | Status: AC
  Administered 2019-03-25: 1 mg via INTRAVENOUS
  Filled 2019-03-25: qty 1

## 2019-03-25 SURGICAL SUPPLY — 15 items

## 2019-03-25 NOTE — Anesthesia Postprocedure Evaluation (Signed)
Anesthesia Post Note  Patient: Patrick [name]  Procedure(s) Performed: ESOPHAGOGASTRODUODENOSCOPY (EGD) WITH PROPOFOL (N/A ) BALLOON DILATION (N/A )     Patient location during evaluation: PACU Anesthesia Type: General Level of consciousness: awake and alert Pain management: pain level controlled Vital Signs Assessment: post-procedure vital signs reviewed and stable Respiratory status: spontaneous breathing, nonlabored ventilation, respiratory function stable and patient connected to nasal cannula oxygen Cardiovascular status: blood pressure returned to baseline and stable Postop Assessment: no apparent nausea or vomiting Anesthetic complications: no    Last Vitals:  Vitals:   03/25/19 1620 03/25/19 1621  BP: 124/78 (!) 179/67  Pulse: 63 61  Resp: (!) 21 19  Temp:    SpO2: 91% 100%    Last Pain:  Vitals:   03/25/19 1621  TempSrc:   PainSc: 0-No pain                 Effie Berkshire

## 2019-03-25 NOTE — Transfer of Care (Signed)
Immediate Anesthesia Transfer of Care Note  Patient: Patrick [name]  Procedure(s) Performed: ESOPHAGOGASTRODUODENOSCOPY (EGD) WITH PROPOFOL (N/A ) BALLOON DILATION (N/A )  Patient Location: Endoscopy Unit  Anesthesia Type:MAC  Level of Consciousness: awake, alert , oriented and patient cooperative  Airway & Oxygen Therapy: Patient Spontanous Breathing and Patient connected to face mask oxygen  Post-op Assessment: Report given to RN, Post -op Vital signs reviewed and stable and Patient moving all extremities  Post vital signs: Reviewed and stable  Last Vitals:  Vitals Value Taken Time  BP    Temp    Pulse 63 03/25/19 1557  Resp 18 03/25/19 1557  SpO2 100 % 03/25/19 1557  Vitals shown include unvalidated device data.  Last Pain:  Vitals:   03/25/19 1505  TempSrc: Oral  PainSc: 0-No pain         Complications: No apparent anesthesia complications

## 2019-03-25 NOTE — ED Provider Notes (Signed)
[facility] DEPT Provider Note   CSN: [id number] Arrival date & time: 03/25/19  1143   History   Chief Complaint Chief Complaint  Patient presents with  . Swallowed Foreign Body   HPI Patrick [name] is a 85 y.o. male with past medical history significant for HTN, GERD presents for evaluation of food bolus. Hx of strictures followed by Dr. Cristina Gong with [facility] outpatient. Eating tenderloin yesterday at [id number] and feels sensation of this getting stuck in his esophagus since then. He is tolerating secretions however not PO intake.  Per daughter patient has had multiple endoscopies in the past for similar presentations.  States glucagon does not normally work.  He does have history of known strictures.  Daughter states they did call GI who told him to present to the emergency department.  No known Covid exposures.  Denies fever, chills, nausea, vomiting, chest pain, shortness of breath, abdominal pain, diarrhea, dysuria, melena, hematochezia.  Patient states like it is in his lower esophagus.   History obtained from patient, daughter and past medical records.  No interpreter is used.     HPI  Past Medical History:  Diagnosis Date  . BPH (benign prostatic hypertrophy)   . Diverticulosis   . ED (erectile dysfunction)   . GERD (gastroesophageal reflux disease)   . Gout   . Hepatitis    hx hepatitis after mono as teenager  . History of kidney stones   . History of skin cancer   . Hyperlipidemia   . Hypertension   . Hypothyroidism   . IHD (ischemic heart disease)    Remote CABG in 1997  . MI, acute, non ST segment elevation ([place]) 2010   s/p cath with occluded SVG to PD that fills by collaterals and the remainder of his revasculsarization is satisfactory. "the first time they did the stent , the second time they did the graft 8 vessels"  . Nocturia   . Thrombocytopenia ([place]) 11/19/2018  . Urinary frequency     Patient Active Problem [name]   Diagnosis Date  Noted  . Diverticulitis of intestine with perforation and abscess without bleeding 02/26/2019  . Hypokalemia 02/26/2019  . Occult blood positive stool 02/26/2019  . Acute diverticulitis 02/19/2019  . Thrombocytopenia ([place]) 11/19/2018  . Unstable angina ([place]) 11/14/2018  . Essential (primary) hypertension 02/25/2018  . Claudication ([place]) 04/21/2016  . History of cold sores 03/22/2011  . Coronary artery disease involving native coronary artery of native heart without angina pectoris 10/28/2010  . Hyperlipemia 10/28/2010  . Hypothyroidism 10/28/2010    Past Surgical History:  Procedure Laterality Date  . APPENDECTOMY    . CARDIAC CATHETERIZATION  09/08/2008   NORMAL. EF 60%; Occluded SVG to PD that fills by collaterals and the remainder of his revascularization is satisfactory. he is managed medically.   . CORONARY ARTERY BYPASS GRAFT  1997   CABG x 8  . CORONARY STENT INTERVENTION N/A 11/18/2018   Procedure: CORONARY STENT INTERVENTION;  Surgeon: Sherren Mocha, MD;  Location: [facility];  Service: Cardiovascular;  Laterality: N/A;  . CYSTOSCOPY WITH INSERTION OF UROLIFT N/A 03/19/2015   Procedure: CYSTOSCOPY WITH INSERTION OF FOUR UROLIFTS;  Surgeon: Carolan Clines, MD;  Location: [facility];  Service: Urology;  Laterality: N/A;  . ESOPHAGOGASTRODUODENOSCOPY (EGD) WITH PROPOFOL N/A 02/12/2017   Procedure: ESOPHAGOGASTRODUODENOSCOPY (EGD) WITH PROPOFOL;  Surgeon: Otis Brace, MD;  Location: [facility];  Service: Gastroenterology;  Laterality: N/A;  . ESOPHAGOGASTRODUODENOSCOPY (EGD) WITH PROPOFOL N/A 07/13/2018   Procedure:  ESOPHAGOGASTRODUODENOSCOPY (EGD) WITH PROPOFOL;  Surgeon: Clarene Essex, MD;  Location: [facility];  Service: Endoscopy;  Laterality: N/A;  . FOREIGN BODY REMOVAL N/A 07/13/2018   Procedure: FOREIGN BODY REMOVAL;  Surgeon: Clarene Essex, MD;  Location: [facility];  Service: Endoscopy;  Laterality: N/A;  . LEFT HEART CATH AND CORS/GRAFTS ANGIOGRAPHY N/A  11/18/2018   Procedure: LEFT HEART CATH AND CORS/GRAFTS ANGIOGRAPHY;  Surgeon: Sherren Mocha, MD;  Location: [facility];  Service: Cardiovascular;  Laterality: N/A;        Home Medications    Prior to Admission medications   Medication Sig Start Date End Date Taking? Authorizing Provider  allopurinol (ZYLOPRIM) 300 MG tablet Take 300 mg by mouth daily.    [provider]  aspirin 81 MG tablet Take 81 mg by mouth daily.      [provider]  atorvastatin (LIPITOR) 20 MG tablet Take 1 tablet (20 mg total) by mouth daily. 02/25/18 02/26/19  Daune Perch, NP  clopidogrel (PLAVIX) 75 MG tablet Take 1 tablet (75 mg total) by mouth daily with breakfast. 11/20/18   Barrett, Evelene Croon, PA-C  docusate sodium (COLACE) 100 MG capsule Take 1 capsule (100 mg total) by mouth daily as needed for mild constipation. 02/21/19 05/22/19  Mercy Riding, MD  donepezil (ARICEPT) 10 MG tablet Take 10 mg by mouth at bedtime.  02/13/18   [provider]  dorzolamide-timolol (COSOPT) 22.3-6.8 MG/ML ophthalmic solution Place 1 drop into the right eye 2 (two) times daily. Does in morning and at dinner time. 02/11/19   [provider]  ipratropium (ATROVENT) 0.03 % nasal spray Place 2 sprays into both nostrils 2 (two) times daily. 03/01/19   [provider]  levothyroxine (SYNTHROID) 100 MCG tablet Take 1 tablet (100 mcg total) by mouth daily before breakfast. 02/28/19 03/30/19  Dahal, Marlowe Aschoff, MD  nitroGLYCERIN (NITROSTAT) 0.4 MG SL tablet PLACE 1 TABLET (0.4 MG TOTAL) UNDER THE TONGUE EVERY 5 (FIVE) MINUTES AS NEEDED. 06/24/18   Nahser, Wonda Cheng, MD  NONFORMULARY OR COMPOUNDED ITEM Elderly male with impaired mobility, Benefits from outpatient PT per physical therapy. 02/28/19   Terrilee Croak, MD  polyethylene glycol (MIRALAX) 17 g packet Take 17 g by mouth daily as needed for moderate constipation. 02/21/19   Mercy Riding, MD  prednisoLONE acetate (PRED FORTE) 1 % ophthalmic  suspension Place 1 drop into the right eye 2 (two) times daily.  01/30/19   [provider]  tamsulosin (FLOMAX) 0.4 MG CAPS capsule Take 0.4 mg by mouth daily.     [provider]  Vitamin D, Ergocalciferol, (DRISDOL) 50000 units CAPS capsule Take 50,000 Units by mouth once a week. 07/31/17   [provider]    Family History Family History  Problem Relation Age of Onset  . Heart failure Mother 80    Social History Social History   Tobacco Use  . Smoking status: Former Smoker    Packs/day: 1.00    Years: 15.00    Pack years: 15.00    Types: Cigarettes    Quit date: 05/15/1958    Years since quitting: 60.9  . Smokeless tobacco: Never Used  Substance Use Topics  . Alcohol use: Yes    Comment: occasional beer  . Drug use: No     Allergies   Halcion [triazolam] and Pravachol   Review of Systems Review of Systems  Constitutional: Negative.   HENT: Negative.        Feels like "food in my  throat."  Respiratory: Negative.   Cardiovascular: Negative.   Gastrointestinal: Negative.   Genitourinary: Negative.   Musculoskeletal: Negative.   Skin: Negative.   Neurological: Negative.   All other systems reviewed and are negative.    Physical Exam Updated Vital Signs BP (!) 146/74   Pulse 66   Temp 97.6 F (36.4 C) (Oral)   Resp 17   SpO2 99%   Physical Exam Vitals signs and nursing note reviewed.  Constitutional:      General: He is not in acute distress.    Appearance: He is well-developed. He is not ill-appearing, toxic-appearing or diaphoretic.  HENT:     Head: Normocephalic and atraumatic.     Nose: Nose normal.     Mouth/Throat:     Lips: Pink.     Mouth: Mucous membranes are moist.     Pharynx: Oropharynx is clear. Uvula midline.     Comments: Mucous membranes moist.  No visualized foreign bodies. Eyes:     Pupils: Pupils are equal, round, and reactive to light.  Neck:     Musculoskeletal: Full passive range of motion without  pain, normal range of motion and neck supple.     Trachea: Phonation normal.     Comments: No neck stiffness or neck rigidity. Cardiovascular:     Rate and Rhythm: Normal rate and regular rhythm.     Pulses: Normal pulses.     Heart sounds: Normal heart sounds.  Pulmonary:     Effort: Pulmonary effort is normal. No respiratory distress.     Breath sounds: Normal breath sounds and air entry.     Comments: Clear to auscultation bilateral wheeze, rhonchi or rales. Abdominal:     General: There is no distension.     Palpations: Abdomen is soft.     Comments: Soft, nontender without rebound or guarding  Musculoskeletal: Normal range of motion.     Comments: Moves all 4 extremities without difficulty  Skin:    General: Skin is warm and dry.     Capillary Refill: Capillary refill takes less than 2 seconds.     Comments: Brisk cap refill.  Neurological:     General: No focal deficit present.     Mental Status: He is alert.     Cranial Nerves: Cranial nerves are intact.     Comments: Ambulatory in room without difficulty.    ED Treatments / Results  Labs (all labs ordered are listed, but only abnormal results are displayed) Labs Reviewed  CBC WITH DIFFERENTIAL/PLATELET - Abnormal; Notable for the following components:      Result Value   WBC 3.9 (*)    RBC 4.11 (*)    MCV 102.9 (*)    Platelets 93 (*)    Abs Immature Granulocytes 0.20 (*)    All other components within normal limits  BASIC METABOLIC PANEL - Abnormal; Notable for the following components:   Calcium 8.8 (*)    GFR calc non Af Amer 55 (*)    All other components within normal limits  SARS CORONAVIRUS 2 BY RT PCR (HOSPITAL ORDER, [facility])  [place]   EKG None  Radiology Dg Chest 2 View  Result Date: 03/25/2019 CLINICAL DATA:  85 year old male with concern for food stuck in throat. EXAM: CHEST - 2 VIEW COMPARISON:  Chest radiograph dated 11/14/2018 FINDINGS: Minimal left lung  base atelectasis. No focal consolidation, pleural effusion, or pneumothorax. The cardiac silhouette is within normal limits. Median sternotomy wires and  CABG vascular clips. Atherosclerotic calcification of the aorta. No acute osseous pathology. IMPRESSION: No active cardiopulmonary disease. Electronically Signed   By: Anner Crete M.D.   On: 03/25/2019 13:04    Procedures Procedures (including critical care time)  Medications Ordered in ED Medications  glucagon (human recombinant) (GLUCAGEN) injection 1 mg (1 mg Intravenous Given 03/25/19 1248)  pantoprazole (PROTONIX) injection 40 mg (40 mg Intravenous Given 03/25/19 1327)  propofol (DIPRIVAN) 500 MG/50ML infusion (has no administration in time range)   Initial Impression / Assessment and Plan / ED Course  I have reviewed the triage vital signs and the nursing notes.  Pertinent labs & imaging results that were available during my care of the patient were reviewed by me and considered in my medical decision making (see chart for details).  85 year old male appears otherwise well presents for evaluation of food bolus.  History of similar.  Followed by [facility].  He is able to tolerate his oral secretions however is not able to tolerate his oral medications for solid food at home.  This occurred yesterday approximately 1630.  He appears overall well.  Heart and lungs clear.  No evidence of foreign body in oropharynx.  Plan for labs, glucagon.  If unsuccessful need to contact GI for possible endoscopy. Previous Endo in Feb for sx. No acute respiratory distress, protecting airway.  Consulted with Dr. Alessandra Bevels, plan for EGD. Requesting rapid COVID test. Will consult with [facility] for rapid COVID.  1310: Attempted to contact AC x 3 for rapid Covid test however unable to reach currently.  1319: AC to place rapid COVID order. Dr. Alessandra Bevels in to evaluate patient. Glugagon without relief of possible blockage.   VS stable currently. Protecting  airway. Awaiting COVID test to go to the Endo suit for EGD.  Labs at baseline. DG chest without acute findings.   Per GI, plan for EGD with likely dc home after.  Patient seen and evaluated by attending Dr. Ronnald Nian who agrees with above treatment, plan and disposition.      Final Clinical Impressions(s) / ED Diagnoses   Final diagnoses:  Food impaction of esophagus, initial encounter    ED Discharge Orders    None       Mardella Nuckles A, PA-C 03/25/19 1453    Lennice Sites, DO 03/25/19 1458

## 2019-03-25 NOTE — ED Triage Notes (Signed)
Patient reports tenderloin stuck in throat since last night at 1830. Maintaining saliva in triage. Unable to tolerate PO. Hx of same.

## 2019-03-25 NOTE — Consult Note (Signed)
Referring Provider: EDP Primary Care Physician:  Marton Redwood, MD Primary Gastroenterologist:  Dr. Cristina Gong  Reason for Consultation: Food impaction  HPI: Patrick [name] is a 85 y.o. male with medical issues as listed below presented to the hospital for food impaction.  Past medical history of coronary artery disease currently on Plavix.  Patient with history of food impaction requiring EGD with food bolus removal in February 2020.  Patient was eating tenderloin yesterday evening and had sensation of it getting stuck in the midesophagus.  He is able to tolerate secretions but not able to have any oral intake and not able to take his medications.  GI is consulted for further evaluation.  Patient seen and examined at bedside in the emergency room.  Besides trouble swallowing since yesterday evening denies any other GI symptoms.  Denies any active chest pain or shortness of breath.  Past Medical History:  Diagnosis Date  . BPH (benign prostatic hypertrophy)   . Diverticulosis   . ED (erectile dysfunction)   . GERD (gastroesophageal reflux disease)   . Gout   . Hepatitis    hx hepatitis after mono as teenager  . History of kidney stones   . History of skin cancer   . Hyperlipidemia   . Hypertension   . Hypothyroidism   . IHD (ischemic heart disease)    Remote CABG in 1997  . MI, acute, non ST segment elevation ([place]) 2010   s/p cath with occluded SVG to PD that fills by collaterals and the remainder of his revasculsarization is satisfactory. "the first time they did the stent , the second time they did the graft 8 vessels"  . Nocturia   . Thrombocytopenia ([place]) 11/19/2018  . Urinary frequency     Past Surgical History:  Procedure Laterality Date  . APPENDECTOMY    . CARDIAC CATHETERIZATION  09/08/2008   NORMAL. EF 60%; Occluded SVG to PD that fills by collaterals and the remainder of his revascularization is satisfactory. he is managed medically.   . CORONARY ARTERY BYPASS GRAFT  1997    CABG x 8  . CORONARY STENT INTERVENTION N/A 11/18/2018   Procedure: CORONARY STENT INTERVENTION;  Surgeon: Sherren Mocha, MD;  Location: [facility];  Service: Cardiovascular;  Laterality: N/A;  . CYSTOSCOPY WITH INSERTION OF UROLIFT N/A 03/19/2015   Procedure: CYSTOSCOPY WITH INSERTION OF FOUR UROLIFTS;  Surgeon: Carolan Clines, MD;  Location: [facility];  Service: Urology;  Laterality: N/A;  . ESOPHAGOGASTRODUODENOSCOPY (EGD) WITH PROPOFOL N/A 02/12/2017   Procedure: ESOPHAGOGASTRODUODENOSCOPY (EGD) WITH PROPOFOL;  Surgeon: Otis Brace, MD;  Location: [facility];  Service: Gastroenterology;  Laterality: N/A;  . ESOPHAGOGASTRODUODENOSCOPY (EGD) WITH PROPOFOL N/A 07/13/2018   Procedure: ESOPHAGOGASTRODUODENOSCOPY (EGD) WITH PROPOFOL;  Surgeon: Clarene Essex, MD;  Location: [facility];  Service: Endoscopy;  Laterality: N/A;  . FOREIGN BODY REMOVAL N/A 07/13/2018   Procedure: FOREIGN BODY REMOVAL;  Surgeon: Clarene Essex, MD;  Location: [facility];  Service: Endoscopy;  Laterality: N/A;  . LEFT HEART CATH AND CORS/GRAFTS ANGIOGRAPHY N/A 11/18/2018   Procedure: LEFT HEART CATH AND CORS/GRAFTS ANGIOGRAPHY;  Surgeon: Sherren Mocha, MD;  Location: [facility];  Service: Cardiovascular;  Laterality: N/A;    Prior to Admission medications   Medication Sig Start Date End Date Taking? Authorizing Provider  allopurinol (ZYLOPRIM) 300 MG tablet Take 300 mg by mouth daily.    [provider]  aspirin 81 MG tablet Take 81 mg by mouth daily.      [provider]  atorvastatin (LIPITOR) 20 MG tablet Take 1 tablet (20 mg total) by mouth daily. 02/25/18 02/26/19  Daune Perch, NP  clopidogrel (PLAVIX) 75 MG tablet Take 1 tablet (75 mg total) by mouth daily with breakfast. 11/20/18   Barrett, Evelene Croon, PA-C  docusate sodium (COLACE) 100 MG capsule Take 1 capsule (100 mg total) by mouth daily as needed for mild constipation. 02/21/19 05/22/19  Mercy Riding, MD  donepezil  (ARICEPT) 10 MG tablet Take 10 mg by mouth at bedtime.  02/13/18   [provider]  dorzolamide-timolol (COSOPT) 22.3-6.8 MG/ML ophthalmic solution Place 1 drop into the right eye 2 (two) times daily. Does in morning and at dinner time. 02/11/19   [provider]  ipratropium (ATROVENT) 0.03 % nasal spray Place 2 sprays into both nostrils 2 (two) times daily. 03/01/19   [provider]  levothyroxine (SYNTHROID) 100 MCG tablet Take 1 tablet (100 mcg total) by mouth daily before breakfast. 02/28/19 03/30/19  Dahal, Marlowe Aschoff, MD  nitroGLYCERIN (NITROSTAT) 0.4 MG SL tablet PLACE 1 TABLET (0.4 MG TOTAL) UNDER THE TONGUE EVERY 5 (FIVE) MINUTES AS NEEDED. 06/24/18   Nahser, Wonda Cheng, MD  NONFORMULARY OR COMPOUNDED ITEM Elderly male with impaired mobility, Benefits from outpatient PT per physical therapy. 02/28/19   Terrilee Croak, MD  polyethylene glycol (MIRALAX) 17 g packet Take 17 g by mouth daily as needed for moderate constipation. 02/21/19   Mercy Riding, MD  prednisoLONE acetate (PRED FORTE) 1 % ophthalmic suspension Place 1 drop into the right eye 2 (two) times daily.  01/30/19   [provider]  tamsulosin (FLOMAX) 0.4 MG CAPS capsule Take 0.4 mg by mouth daily.     [provider]  Vitamin D, Ergocalciferol, (DRISDOL) 50000 units CAPS capsule Take 50,000 Units by mouth once a week. 07/31/17   [provider]    Scheduled Meds: . glucagon (human recombinant)  1 mg Intravenous Once   Continuous Infusions: PRN Meds:.  Allergies as of 03/25/2019 - Review Complete 03/25/2019  Allergen Reaction Noted  . Halcion [triazolam]  03/20/2011  . Pravachol  10/25/2010    Family History  Problem Relation Age of Onset  . Heart failure Mother 69    Social History   Socioeconomic History  . Marital status: Married    Spouse name: [name]   . Number of children: Not on file  . Years of education: Not on file  . Highest education level: Not on file   Occupational History  . Occupation: Retired    Fish farm manager: RETIRED  Social Needs  . Financial resource strain: Not on file  . Food insecurity    Worry: Never true    Inability: Never true  . Transportation needs    Medical: No    Non-medical: No  Tobacco Use  . Smoking status: Former Smoker    Packs/day: 1.00    Years: 15.00    Pack years: 15.00    Types: Cigarettes    Quit date: 05/15/1958    Years since quitting: 60.9  . Smokeless tobacco: Never Used  Substance and Sexual Activity  . Alcohol use: Yes    Comment: occasional beer  . Drug use: No  . Sexual activity: Yes  Lifestyle  . Physical activity    Days per week: Not on file    Minutes per session: Not on file  . Stress: Not on file  Relationships  . Social connections    Talks on phone: Not on file  Gets together: Not on file    Attends religious service: Not on file    Active member of club or organization: Not on file    Attends meetings of clubs or organizations: Not on file    Relationship status: Not on file  . Intimate partner violence    Fear of current or ex partner: Not on file    Emotionally abused: Not on file    Physically abused: Not on file    Forced sexual activity: Not on file  Other Topics Concern  . Not on file  Social History Narrative   Mr Kinzie is a 85 year old retired male who lives at home with his wife, [name].      Review of Systems: Review of Systems  Constitutional: Negative for chills and fever.  HENT: Positive for hearing loss. Negative for tinnitus.   Eyes: Negative for blurred vision and double vision.  Respiratory: Negative for cough and hemoptysis.   Cardiovascular: Negative for chest pain and palpitations.  Gastrointestinal: Positive for heartburn and nausea. Negative for abdominal pain, blood in stool, constipation, diarrhea, melena and vomiting.  Genitourinary: Negative for dysuria and urgency.  Musculoskeletal: Negative for myalgias and neck pain.  Skin: Negative  for itching and rash.  Neurological: Negative for seizures and loss of consciousness.  Endo/Heme/Allergies: Does not bruise/bleed easily.  Psychiatric/Behavioral: Negative for substance abuse. The patient is not nervous/anxious.     Physical Exam: Vital signs: Vitals:   03/25/19 1151  BP: (!) 167/73  Pulse: 72  Resp: 16  Temp: 97.6 F (36.4 C)  SpO2: 100%     Physical Exam  Constitutional: He is oriented to person, place, and time. He appears well-developed and well-nourished. No distress.  HENT:  Head: Normocephalic and atraumatic.  Eyes: EOM are normal.  Neck: Normal range of motion. Neck supple.  Cardiovascular: Normal rate and regular rhythm.  Pulmonary/Chest: Effort normal. No respiratory distress.  Abdominal: Soft. Bowel sounds are normal. He exhibits no distension. There is no abdominal tenderness. There is no rebound and no guarding.  Musculoskeletal: Normal range of motion.        General: No edema.  Neurological: He is alert and oriented to person, place, and time.  Skin: Skin is warm. No erythema.  Psychiatric: He has a normal mood and affect. Judgment and thought content normal.  Vitals reviewed.   GI:  Lab Results: No results for input(s): WBC, HGB, HCT, PLT in the last 72 hours. BMET No results for input(s): NA, K, CL, CO2, GLUCOSE, BUN, CREATININE, CALCIUM in the last 72 hours. LFT No results for input(s): PROT, ALBUMIN, AST, ALT, ALKPHOS, BILITOT, BILIDIR, IBILI in the last 72 hours. PT/INR No results for input(s): LABPROT, INR in the last 72 hours.   Studies/Results: No results found.  Impression/Plan: -Food impaction -History of esophageal dysphagia  Recommendations ------------------------ -EGD today  -Protonix 40 mg once IV -Awaiting COVID-19 test result  Risks (bleeding, infection, bowel perforation that could require surgery, sedation-related changes in cardiopulmonary systems), benefits (identification and possible treatment of source  of symptoms, exclusion of certain causes of symptoms), and alternatives (watchful waiting, radiographic imaging studies, empiric medical treatment)  were explained to patient and family in detail and patient wishes to proceed.    LOS: 0 days   Otis Brace  MD, FACP 03/25/2019, 12:45 PM  Contact #  [phone]

## 2019-03-25 NOTE — Discharge Instructions (Signed)

## 2019-03-25 NOTE — ED Provider Notes (Signed)
Medical screening examination/treatment/procedure(s) were conducted as a shared visit with non-physician practitioner(s) and myself.  I personally evaluated the patient during the encounter. Briefly, the patient is a 85 y.o. male with history of high cholesterol, hypertension, food impactions in the past who presents to the ED with a food bolus.  Patient with normal vitals.  No fever.  Patient ate a piece of steak last night and has not been able to tolerate p.o. since.  History of the same.  No signs of respiratory distress.  Clear breath sounds.  Unremarkable vitals.  He has already talked with GI who directed him to the ED.  We will try glucagon.  GI will take patient for endoscopy.  Covid test is ordered.  Anticipate discharge after endoscopy.   EKG Interpretation None          Lennice Sites, DO 03/25/19 1336

## 2019-03-25 NOTE — ED Notes (Signed)
Patient transported to X-ray 

## 2019-03-25 NOTE — Anesthesia Procedure Notes (Signed)
Procedure Name: Intubation Date/Time: 03/25/2019 3:20 PM Performed by: Mitzie Na, CRNA Pre-anesthesia Checklist: Patient identified, Emergency Drugs available, Suction available and Patient being monitored Patient Re-evaluated:Patient Re-evaluated prior to induction Oxygen Delivery Method: Circle system utilized Preoxygenation: Pre-oxygenation with 100% oxygen Induction Type: IV induction and Rapid sequence Ventilation: Mask ventilation without difficulty Laryngoscope Size: Mac and 3 Grade View: Grade I Tube type: Oral Number of attempts: 1 Airway Equipment and Method: Stylet and Oral airway Placement Confirmation: ETT inserted through vocal cords under direct vision,  positive ETCO2 and breath sounds checked- equal and bilateral Secured at: 25 cm Tube secured with: Tape Dental Injury: Teeth and Oropharynx as per pre-operative assessment

## 2019-03-25 NOTE — Anesthesia Preprocedure Evaluation (Addendum)
Anesthesia Evaluation  Patient identified by MRN, date of birth, ID band Patient awake    Reviewed: Allergy & Precautions, NPO status , Patient's Chart, lab work & pertinent test results  Airway Mallampati: I  TM Distance: >3 FB Neck ROM: Full    Dental  (+) Teeth Intact, Dental Advisory Given, Chipped,    Pulmonary former smoker,    breath sounds clear to auscultation       Cardiovascular hypertension, + CAD and + CABG   Rhythm:Regular Rate:Normal     Neuro/Psych negative neurological ROS     GI/Hepatic GERD  ,(+) Hepatitis -  Endo/Other  Hypothyroidism   Renal/GU negative Renal ROS     Musculoskeletal   Abdominal Normal abdominal exam  (+)   Peds  Hematology   Anesthesia Other Findings   Reproductive/Obstetrics                            Anesthesia Physical Anesthesia Plan  ASA: III  Anesthesia Plan: General   Post-op Pain Management:    Induction: Intravenous  PONV Risk Score and Plan: 1 and Ondansetron  Airway Management Planned: Oral ETT  Additional Equipment: None  Intra-op Plan:   Post-operative Plan: Extubation in OR  Informed Consent: I have reviewed the patients History and Physical, chart, labs and discussed the procedure including the risks, benefits and alternatives for the proposed anesthesia with the patient or authorized representative who has indicated his/her understanding and acceptance.     Dental advisory given  Plan Discussed with: CRNA  Anesthesia Plan Comments:        Anesthesia Quick Evaluation

## 2019-03-25 NOTE — Op Note (Signed)
[facility] Patient Name: Patrick [name] Procedure Date: 03/25/2019 MRN: [id number] Attending MD: Otis Brace , MD Date of Birth: [birth year] CSN: [id number] Age: 85 Admit Type: Emergency Department Procedure:                Upper GI endoscopy Indications:              Esophageal dysphagia, Foreign body in the                            esophagus/Food impaction Providers:                Otis Brace, MD, Cleda Daub, RN, Lina Sar, Technician, Marguerita Merles, Technician,                            Raphael Gibney, CRNA Referring MD:              Medicines:                 Complications:            No immediate complications. Estimated Blood Loss:     Estimated blood loss was minimal. Procedure:                Pre-Anesthesia Assessment:                           - Prior to the procedure, a History and Physical                            was performed, and patient medications and                            allergies were reviewed. The patient's tolerance of                            previous anesthesia was also reviewed. The risks                            and benefits of the procedure and the sedation                            options and risks were discussed with the patient.                            All questions were answered, and informed consent                            was obtained. Prior Anticoagulants: The patient has                            taken Plavix (clopidogrel), last dose was 1 day                            prior to procedure.  ASA Grade Assessment: III - A                            patient with severe systemic disease. After                            reviewing the risks and benefits, the patient was                            deemed in satisfactory condition to undergo the                            procedure.                           After obtaining informed consent, the endoscope was                   passed under direct vision. Throughout the                            procedure, the patient's blood pressure, pulse, and                            oxygen saturations were monitored continuously. The                            [id number] Olympus gastroscope was                            introduced through the mouth, and advanced to the                            second part of duodenum. The upper GI endoscopy was                            accomplished without difficulty. The patient                            tolerated the procedure well. Scope In: Scope Out: Findings:      There is no endoscopic evidence of food in the entire esophagus.      A non-obstructing Schatzki ring was found at the gastroesophageal       junction. A TTS dilator was passed through the scope. Dilation with a       12-13.5-15 mm balloon dilator was performed to 13.5 mm. The dilation       site was examined following endoscope reinsertion and showed no       bleeding, mucosal tear or perforation.      Scattered mild inflammation was found in the entire examined stomach.      The cardia and gastric fundus were normal on retroflexion.      Localized mild inflammation characterized by congestion (edema) and       erythema was found in the duodenal bulb.      The second portion of the duodenum was normal.      A small hiatal hernia was  present. Impression:               - Non-obstructing Schatzki ring. Dilated.                           - Gastritis.                           - Duodenitis.                           - Normal second portion of the duodenum.                           - Small hiatal hernia.                           - No specimens collected. Moderate Sedation:      per CRNA      Moderate (conscious) sedation was administered by the endoscopy nurse       and supervised by the endoscopist. The following parameters were       monitored: oxygen saturation, heart rate, blood  pressure, and response       to care. Recommendation:           - Patient has a contact number available for                            emergencies. The signs and symptoms of potential                            delayed complications were discussed with the                            patient. Return to normal activities tomorrow.                            Written discharge instructions were provided to the                            patient.                           - Soft diet.                           - Continue present medications.                           - Use Protonix (pantoprazole) 40 mg PO daily. Procedure Code(s):        --- Professional ---                           [phone], Esophagogastroduodenoscopy, flexible,                            transoral; with transendoscopic balloon dilation of  esophagus (less than 30 mm diameter) Diagnosis Code(s):        --- Professional ---                           K22.2, Esophageal obstruction                           K29.70, Gastritis, unspecified, without bleeding                           K29.80, Duodenitis without bleeding                           R13.14, Dysphagia, pharyngoesophageal phase                           T18.108A, Unspecified foreign body in esophagus                            causing other injury, initial encounter CPT copyright 2019 [employer]. All rights reserved. The codes documented in this report are preliminary and upon coder review may  be revised to meet current compliance requirements. Otis Brace, MD Otis Brace, MD 03/25/2019 3:46:10 PM Number of Addenda: 0

## 2019-03-26 ENCOUNTER — Encounter: Payer: Self-pay | Admitting: *Deleted

## 2019-03-26 ENCOUNTER — Other Ambulatory Visit: Payer: Self-pay | Admitting: *Deleted

## 2019-03-26 NOTE — Patient Outreach (Signed)
[facility]) Care Management  03/26/2019  Patrick [name] [name] 1926/08/30 [id number]   Care coordination Completed [place] forms and sent to giving committee  Plan: [facility] RN CMwill follow up with Patrick [name] & Patrick [name] within the next 7-14 days for further assessment of needs   Joelene Millin L. Lavina Hamman, RN, BSN, [place] Coordinator Office number [phone] Mobile number [phone]  Main [facility] number [phone] Fax number [phone]

## 2019-03-26 NOTE — Patient Outreach (Addendum)
[name]) Care Management  03/26/2019  Patrick [name] 12/16/1926 [id number]   Follow upTelephone assessment  Recent referral Date:02/28/19 Referral Source:Patrick [name] [name] liaison, forPost [name] follow up Date of Admission:02/26/19 Diagnosis:Coronary artery disease involving native coronary artery of native heart without angina Date of Discharge:02/28/19 home on aSoft diet for next 2 to 3 days, then advance to cardiac diet as tolerated. Facility:[name] Insurance:Medicare and Blue cross and Blue shield Supplement 3 [name] admissions within past 6 months hospitalized from 10/7-10/9 for sigmoid diverticulitis with microperforation but no abscess. 11/14/18 to 11/19/18 for unstable angina, cardiac cath  Patrick [name] has not been hospitalized or in the ED in the last 26 days  Outreach attempt successfully to the home/mobile number [facility] RN CM spoke with Patrick [name] as Patrick [name] has dementia  Review of recommendations for 03/14/19 [name] multidisciplinary meeting  Support systems- [name] RN CM informed Patrick [name] that the [facility] RN CM would refer Patrick [name] to [name] to assist her with services and resources in the home care of Patrick [name]. Initially she agreed to the referral and then questioned what the SW could offer her. [name] RN CM discussed the importance of her having support to care for him . THN RN CM reviewed THN SW resources and examples of assistance with support personal care services for dementia and home care, transportation, care respite, advance directives, financial resources, placement, etc. She then informed [facility] RN CM that she did not think she needed Patrick [name] SW referral this time. She reports Patrick [name] does not prefer "anyone to help him but me" and may not allow anyone in the home except her. She reports he gets "Irate" but she has been able to deal with it.  She reports he was recently placed on medication for his mood  BP  cuff THN RN CM reviewed that THN only had Omron BP cuffs. Patrick [name] confirms this is the same BP cuff she purchased that EMS stated did not work well.  Patrick [name] agrees and gives permission to have [place] committee to assist with a BP cuff for Patrick [name] Request message sent to [name] giving committee   [name] PT services Patrick [name] reports [name] was offered to Patrick [name] several times but he has refused [name] PT services. She reports he does not prefer "anyone to help him but me" and may not allow anyone in the home except her.   Recent Endoscopy on 03/25/19  Patrick [name] confirms the endoscopy was completed. She discussed being at [name] and MD appointments. She states the endoscopy went well and denies any concerns when assessed for needs or questions about the EGD for food impaction of esophagus. She reports he has a sore throat but is doing good.  Patrick [name] denies questions bout EMMI materials sent  [name] RN CM updated [place] [name] multidisciplinary template for 83/99/35 and sent to Patrick [name] who lives at home with his wife, Patrick [name]. He was in the [employer].They deny concerns with his care needs and transportation to medical appointments at this time   Patrick [name] religion was clarified for Epic Patrick [name] reports "He really does not have one but grew up [name]." She reports that Patrick [name] after the "war" was not a religious person after the "war" related to the "deaths"  Conditions:Coronary artery disease involving native coronary artery of native heart without angina,CAD/CABG and recent PCI with  stent placement in 11/2018, CKD-3,Gout, BPH thrombocytopenia, dementia, GERD, HLD, hypothyroidism, HTN, diverticulosis, hypokalemia, occult blood positive stool   Fall x 1 reported to fall into a trash can in the garage "He caught himself" Patrick Egge reports he "scraped his arm" DMERolling walker, 3 n 1, cane Consent:THN RN CM reviewed [name]  services with patient. Patient gave verbal consent for [name] telephonic RN CM services.  Plan: [facility] RN [name] follow up with Patrick & Patrick [name] within the next 7-14 days for further assessment of needs   Pt encouraged to return a call to [facility] RN CM prn  Routed note to MD  [place] Problem One     Most Recent Value  Care Plan Problem One  Risk for [name] readmission secondary to recent admission for CAD  Role Documenting the Problem One  Care Management Telephonic Coordinator  Care Plan for Problem One  Active  THN Long Term Goal   Over the next 31 days, patient will not experience [name] readmission, as evidenced by patient reporting and review of EMR during [facility] RN CCM outreach  [place] Term Goal Start Date  03/03/19  Interventions for Problem One Long Term Goal  assessed for [name] PT needs, BP cuff needs, home support for wife needs, assessed for questions, concerns after 02/22/19 endoscopy  THN CM Short Term Goal #1   Over the next 7 days, patient will schedule [name] follow up office visit appointments with cardiologist and PCP, as evidenced by patient reporting during [facility] RN CCM outreach  [name] CM Short Term Goal #1 Start Date  03/03/19  [facility] CM Short Term Goal #1 Met Date  03/10/19  THN CM Short Term Goal #2   over the next 21 days EMMI materials will be received and reviewed to assist with home care during a follow up call   THN CM Short Term Goal #2 Start Date  03/10/19  [name] CM Short Term Goal #2 Met Date  03/26/19  THN CM Short Term Goal #3  over the next 30 days patietn will receive a new BP monitor to assist with HTN/CAD home care from [name] giving committee as verbalized during follow up call  [name] CM Short Term Goal #3 Start Date  03/26/19  Interventions for Short [place]ern Goal #3  review [name] Multidisciplinary meeting recommendation for BP cuff, discussed giving committee, received permission to request assist from giving committee      Chigozie Basaldua L. Lavina Hamman, RN, BSN, [place] Coordinator Office number [phone] Mobile number [phone]  Main THN number [phone] Fax number [phone]

## 2019-03-27 ENCOUNTER — Encounter (HOSPITAL_COMMUNITY): Payer: Self-pay | Admitting: Gastroenterology

## 2019-03-28 ENCOUNTER — Other Ambulatory Visit: Payer: Self-pay | Admitting: *Deleted

## 2019-03-28 NOTE — Patient Outreach (Signed)
Patrick [name]) Care Management  03/28/2019  Patrick [name] 1926-06-27 [id number]   Care coordination - [place] [name] Multidisciplinary team meeting  [facility] RN CM presented update information to [name] Multidisciplinary team   Recommendations: Assess and discuss outpatient rehab, taking BP cuff in to next MD office visit for comparison of BP values and availability of local fire station  Plan [place] follow up with Mr & Mrs Blackledge within the next 7-14 days for further assessment of needs    Patrick Gailey L. Lavina Hamman, RN, BSN, [place] Coordinator Office number [phone] Mobile number [phone]  Main THN number [phone] Fax number [phone]

## 2019-03-28 NOTE — Patient Outreach (Signed)
[facility]) Care Management  03/28/2019  [place] H Espin [birth year] [id number]  Care coordination  Collaboration with PTAR Thayer Headings [phone]) about BP cuff CM informed manual BP cuffs used and correct size of cuff related to patient arm size, no wrist cuffs recommended  Thayer Headings discussed wife may have been referring to manual vs electronic cuff and varying of BP values over time Updated Giving committee staff  Plan [facility] RN CMwill follow up with Mr & Mrs Rodeman within the next 7-14 days for further assessment ofneeds  Jennamarie Goings L. Lavina Hamman, RN, BSN, [place] Coordinator Office number [phone] Mobile number [phone]  Main THN number [phone] Fax number [phone]

## 2019-04-02 ENCOUNTER — Other Ambulatory Visit: Payer: Self-pay | Admitting: *Deleted

## 2019-04-02 NOTE — Patient Outreach (Signed)
[facility]) Care Management  04/02/2019  [place] H Siemon 04-16-1927 [id number]   Follow upTelephone assessment  Recent referral Date:02/28/19 Referral Source:[place] hospital liaison, forPost hospital follow up Date of Admission:02/26/19 Diagnosis:Coronary artery disease involving native coronary artery of native heart without angina Date of Discharge:02/28/19 home on aSoft diet for next 2 to 3 days, then advance to cardiac diet as tolerated. [facility] Insurance:Medicare and Blue cross and Blue shield Supplement 3 hospital admissions within past 6 months hospitalized from 10/7-10/9 for sigmoid diverticulitis with microperforation but no abscess. 11/14/18 to 11/19/18 for unstable angina, cardiac cath   Outreach attemptsuccessfully to the home/mobile number [facility] RN CM spoke with Mrs Meng as Mr Praska has dementia  [facility] RN CM discussed the purpose of the call to review [facility] Multidisciplinary meeting recommendations, to review some recommendations and inquire about some more information needed   She began to voice her feelings about Mr Brueck not receiving a BP cuff and her previous mentioned concerns about care during his last admission and walker. THN RN CM allowed her time to ventilate her feelings [facility] RN CM inquired if she had called the [place] pt experience number and she stated she had called but had no return calls from multiple calls. THN RN CM acknowledged her voiced concerns and attempted to conclude the call but she stated " I'm frustrated but I will answer your questions"  She reports she has not taken their BP cuff to any of Mr [facility] medical appointments and "I will not. I will just go buy one." His next follow up appointment is not until December 83 2020.  THN RN CM attempted to discuss comparing the electronic BP monitor reading with a manual BP value but was interrupted as Mrs Becht ventilated her feelings.   She confirms she has not had or used a manual BP monitor before because " I am not a nurse."  She reports Mr Masek has been offered outpatient therapy but " He does not want to go" to outpatient therapy nor wants in home therapy. THN RN CM attempted to review fire station resource but was interrupted by Mrs Suba. She states she did not know about this resource. THN RN CM was not able to complete providing the resource information. Mrs Ron ventilated her feelings about EMS and the fire department. THN RN CM discussed the contact with EMS staff about recommended home BP cuffs and being informed the Omron is recommended. She states, "I trust the EMS people who were here."  She ventilated her feelings. THN  RN CM thanked Mrs Kritzman for her time.   THN RN CM sent this information to staff of the [facility] Giving committee via E mail    Social:Mr Horr is a 85 year old retired male who lives at home with his wife, [name]. He was in the [employer].They deny concerns with his care needs and transportation to medical appointments at this time   Mr Bunner religion was clarified for Epic Mrs Barrish reports "He really does not have one but grew up [facility]." She reports that Mr Schetter after the "war" was not a religious person after the "war" related to the "deaths"  Conditions:Coronary artery disease involving native coronary artery of native heart without angina,CAD/CABG and recent PCI with stent placement in 11/2018, CKD-3,Gout, BPH thrombocytopenia, dementia, GERD, HLD, hypothyroidism, HTN, diverticulosis, hypokalemia, occult blood positive stool   Fall x 1 reported to fall into a trash can in the garage "He caught  himself" Mrs Bhattacharyya reports he "scraped his arm" DMERolling walker, 3 n 1, cane Consent:THN RN CM reviewed [facility] services with patient. Patient gave verbal consent for [facility] telephonic RN CM services  Plan: [facility] RN CMwill follow up with Mr & Mrs Dutta within the next 7-14 days for further  assessment of needs    Kimberly L. Lavina Hamman, RN, BSN, [place] Coordinator Office number [phone] Mobile number [phone]  Main THN number [phone] Fax number [phone]

## 2019-04-04 ENCOUNTER — Other Ambulatory Visit: Payer: Self-pay | Admitting: *Deleted

## 2019-04-04 NOTE — Patient Outreach (Signed)
[name]) Care Management  04/04/2019  Patrick [name] Jun 29, 1926 [id number]   Care coordination.case closure   [facility] RN [name] called and updated Patrick [name] & Patrick [name] about not being able to receive assistance from the giving committee at this time. HIPAA Both Patrick [name] & Patrick [name] were noted to state "I knew it"  [name] RN [name] clarified with Patrick [name] as she was noted to inform Patrick [name] that "[facility] would not help", that Patrick [name] partners with  and the [name] giving committee is a [name] program who offered to assist She thanked [facility] RN [name] for [facility] RN [name]'s offer to help and informed Patrick [name] "I have plenty of money and will go buy another one" She again informed [facility] RN [name] she would not take the BP cuff to a MD office to have it checked by staff and have a reading compared to a manual BP cuff  Discussed case closure  Plan Case closure - [facility] RN [name] will close case at this time as patient has been assessed and no needs identified/needs resolved.   Letters to patient and MD  Routed note to MDs/NP/PA   [place]. Patrick Hamman, RN, BSN, [place] Coordinator Office number [phone] Mobile number [phone]  Main Patrick number [phone] Fax number [phone]

## 2019-04-06 ENCOUNTER — Other Ambulatory Visit: Payer: Self-pay | Admitting: Nurse Practitioner

## 2019-04-11 ENCOUNTER — Ambulatory Visit: Payer: Self-pay | Admitting: *Deleted

## 2019-04-14 DIAGNOSIS — H31091 Other chorioretinal scars, right eye: Secondary | ICD-10-CM | POA: Diagnosis not present

## 2019-04-14 DIAGNOSIS — H35373 Puckering of macula, bilateral: Secondary | ICD-10-CM | POA: Diagnosis not present

## 2019-04-14 DIAGNOSIS — H33051 Total retinal detachment, right eye: Secondary | ICD-10-CM | POA: Diagnosis not present

## 2019-04-17 DIAGNOSIS — R198 Other specified symptoms and signs involving the digestive system and abdomen: Secondary | ICD-10-CM | POA: Diagnosis not present

## 2019-04-17 DIAGNOSIS — R159 Full incontinence of feces: Secondary | ICD-10-CM | POA: Diagnosis not present

## 2019-04-26 NOTE — Progress Notes (Signed)
CARDIOLOGY OFFICE NOTE  Date:  04/29/2019    [place] H Bianchini Date of Birth: Nov 17, 1926 Medical Record [id number]  PCP:  Marton Redwood, MD  Cardiologist:  [facility]   Chief Complaint  Patient presents with  . Follow-up    Seen for Dr. Acie Fredrickson    History of Present Illness: [place] H Berringer is a 85 y.o. male who presents today for a 2 month check. Seen for Dr. Acie Fredrickson. He is a former patient of Dr. Susa Simmonds.   He has a known history of CAD with remote CABG from 70 (Dr. Redmond Pulling), HTN, HLD, hypothyroidism and GERD.   His wife reached out to me just before the July 4th holiday weekend - he was having marked SOB and unstable angina -had not been doing well for several weeks -he was referred for admission.His cardiac enzymes were negative for MI and his ECG was nonacute. An echocardiogram showed preserved left ventricular function with no wall motion abnormalities, butthere iscalciumon both mitral and aortic valves.   He underwent cardiac cath with Dr. Burt Knack on7/10/2018 withDES to the SVG-distal RCA. He was noted to have thrombocytopenia -withno bleeding issues. BP was elevated and his ARB was increased. He has resting bradycardia and is on beta blocker therapy- this was not changed.  His TSH was markedly low -he has had his dose of Synthroid reduced, then stopped and now back on increasing dose.   I have seen him as a work in a few times - he has developed marked orthostasis - not clear to me as to why - ended up off most of his medicines - was placed on Florinef with some improvement. He saw Dr. Acie Fredrickson just a few weeks ago - cath films again reviewed - lots of native disease - trying to manage medically - he had been placed on Ranexa. Then developed abdominal abscess from diverticulitis and was back in the hospital. 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   Last seen at the end of October - continues to feel bad,  tired, crying and angry with his situation. We discussed going on SSRI therapy and he was to discuss with Dr. Brigitte Pulse. Thyroid dose has slowly returned to where it was earlier this year prior to having hyperthyroidism.   TThe patient does not have symptoms concerning for COVID-19 infection (fever, chills, cough, or new shortness of breath).   Comes in today. Here with his wife [name] - he remains angry and negative. Very sad. His nose constantly runs - this is chronic. He remains very fatigued. Sleeping a lot. Sounds like he is using some Benadryl to help with his runny nose - I advised to not use this. May be on some sort of anti-depressant now - they do not know the name. He seemed to be able to do more over the past 3 days - was working in the garage and organizing. No chest pain. Just fatigue. Had a food impaction since last visit here and was in the ER. They are seeing Dr. Brigitte Pulse later this week. They are asking for labs here to send to him for review. No more GI issues - this seems resolved.   Past Medical History:  Diagnosis Date  . BPH (benign prostatic hypertrophy)   . Diverticulosis   . ED (erectile dysfunction)   . GERD (gastroesophageal reflux disease)   . Gout   . Hepatitis    hx hepatitis after mono  as teenager  . History of kidney stones   . History of skin cancer   . Hyperlipidemia   . Hypertension   . Hypothyroidism   . IHD (ischemic heart disease)    Remote CABG in 1997  . MI, acute, non ST segment elevation ([place]) 2010   s/p cath with occluded SVG to PD that fills by collaterals and the remainder of his revasculsarization is satisfactory. "the first time they did the stent , the second time they did the graft 8 vessels"  . Nocturia   . Thrombocytopenia ([place]) 11/19/2018  . Urinary frequency     Past Surgical History:  Procedure Laterality Date  . APPENDECTOMY    . BALLOON DILATION N/A 03/25/2019   Procedure: BALLOON DILATION;  Surgeon: Otis Brace, MD;  Location: [facility]  ENDOSCOPY;  Service: Gastroenterology;  Laterality: N/A;  . CARDIAC CATHETERIZATION  09/08/2008   NORMAL. EF 60%; Occluded SVG to PD that fills by collaterals and the remainder of his revascularization is satisfactory. he is managed medically.   . CORONARY ARTERY BYPASS GRAFT  1997   CABG x 8  . CORONARY STENT INTERVENTION N/A 11/18/2018   Procedure: CORONARY STENT INTERVENTION;  Surgeon: Sherren Mocha, MD;  Location: [facility];  Service: Cardiovascular;  Laterality: N/A;  . CYSTOSCOPY WITH INSERTION OF UROLIFT N/A 03/19/2015   Procedure: CYSTOSCOPY WITH INSERTION OF FOUR UROLIFTS;  Surgeon: Carolan Clines, MD;  Location: [facility];  Service: Urology;  Laterality: N/A;  . ESOPHAGOGASTRODUODENOSCOPY (EGD) WITH PROPOFOL N/A 02/12/2017   Procedure: ESOPHAGOGASTRODUODENOSCOPY (EGD) WITH PROPOFOL;  Surgeon: Otis Brace, MD;  Location: [facility];  Service: Gastroenterology;  Laterality: N/A;  . ESOPHAGOGASTRODUODENOSCOPY (EGD) WITH PROPOFOL N/A 07/13/2018   Procedure: ESOPHAGOGASTRODUODENOSCOPY (EGD) WITH PROPOFOL;  Surgeon: Clarene Essex, MD;  Location: [facility];  Service: Endoscopy;  Laterality: N/A;  . ESOPHAGOGASTRODUODENOSCOPY (EGD) WITH PROPOFOL N/A 03/25/2019   Procedure: ESOPHAGOGASTRODUODENOSCOPY (EGD) WITH PROPOFOL;  Surgeon: Otis Brace, MD;  Location: [facility];  Service: Gastroenterology;  Laterality: N/A;  . FOREIGN BODY REMOVAL N/A 07/13/2018   Procedure: FOREIGN BODY REMOVAL;  Surgeon: Clarene Essex, MD;  Location: [facility];  Service: Endoscopy;  Laterality: N/A;  . LEFT HEART CATH AND CORS/GRAFTS ANGIOGRAPHY N/A 11/18/2018   Procedure: LEFT HEART CATH AND CORS/GRAFTS ANGIOGRAPHY;  Surgeon: Sherren Mocha, MD;  Location: [facility];  Service: Cardiovascular;  Laterality: N/A;     Medications: Current Meds  Medication Sig  . allopurinol (ZYLOPRIM) 300 MG tablet Take 300 mg by mouth daily.  Marland Kitchen aspirin 81 MG tablet Take 81 mg by mouth daily.    Marland Kitchen  atorvastatin (LIPITOR) 20 MG tablet Take 1 tablet (20 mg total) by mouth daily.  . clopidogrel (PLAVIX) 75 MG tablet Take 1 tablet (75 mg total) by mouth daily with breakfast.  . docusate sodium (COLACE) 100 MG capsule Take 1 capsule (100 mg total) by mouth daily as needed for mild constipation.  Marland Kitchen donepezil (ARICEPT) 10 MG tablet Take 10 mg by mouth at bedtime.   . dorzolamide-timolol (COSOPT) 22.3-6.8 MG/ML ophthalmic solution Place 1 drop into the right eye 2 (two) times daily. Does in morning and at dinner time.  Marland Kitchen ipratropium (ATROVENT) 0.03 % nasal spray Place 2 sprays into both nostrils 2 (two) times daily.  Marland Kitchen levothyroxine (SYNTHROID) 100 MCG tablet Take 1 tablet (100 mcg total) by mouth daily before breakfast.  . nitroGLYCERIN (NITROSTAT) 0.4 MG SL tablet PLACE 1 TABLET (0.4 MG TOTAL) UNDER THE TONGUE EVERY 5 (FIVE) MINUTES AS  NEEDED.  Marland Kitchen NONFORMULARY OR COMPOUNDED ITEM Elderly male with impaired mobility, Benefits from outpatient PT per physical therapy.  . pantoprazole (PROTONIX) 40 MG tablet Take 1 tablet (40 mg total) by mouth daily before breakfast.  . polyethylene glycol (MIRALAX) 17 g packet Take 17 g by mouth daily as needed for moderate constipation.  . prednisoLONE acetate (PRED FORTE) 1 % ophthalmic suspension Place 1 drop into the right eye 2 (two) times daily.   . tamsulosin (FLOMAX) 0.4 MG CAPS capsule Take 0.4 mg by mouth daily.   . Vitamin D, Ergocalciferol, (DRISDOL) 50000 units CAPS capsule Take 50,000 Units by mouth once a week.     Allergies: Allergies  Allergen Reactions  . Halcion [Triazolam]     "Makes me crazy"  . Pravachol     Unknown    Social History: The patient  reports that he quit smoking about 60 years ago. His smoking use included cigarettes. He has a 15.00 pack-year smoking history. He has never used smokeless tobacco. He reports current alcohol use. He reports that he does not use drugs.   Family History: The patient's family history includes  Heart failure (age of onset: 53) in his mother.   Review of Systems: Please see the history of present illness.   All other systems are reviewed and negative.   Physical Exam: VS:  BP 140/70   Pulse 62   Ht 6' (1.829 m)   Wt 159 lb (72.1 kg)   SpO2 97%   BMI 21.56 kg/m  .  BMI Body mass index is 21.56 kg/m.  Wt Readings from Last 3 Encounters:  04/29/19 159 lb (72.1 kg)  03/11/19 154 lb (69.9 kg)  02/26/19 157 lb (71.2 kg)    General: Pleasant. Alert and in no acute distress. He is not tearful today.  HEENT: Normal.  Neck: Supple, no JVD, carotid bruits, or masses noted.  Cardiac: Regular rate and rhythm. No murmurs, rubs, or gallops. No edema.  Respiratory:  Lungs are clear to auscultation bilaterally with normal work of breathing.  GI: Soft and nontender.  MS: No deformity or atrophy. Gait and ROM intact.  Skin: Warm and dry. Color is normal.  Neuro:  Strength and sensation are intact and no gross focal deficits noted.  Psych: Alert, appropriate and with normal affect.   LABORATORY DATA:  EKG:  EKG is not ordered today.   Lab Results  Component Value Date   WBC 3.9 (L) 03/25/2019   HGB 13.6 03/25/2019   HCT 42.3 03/25/2019   PLT 93 (L) 03/25/2019   GLUCOSE 99 03/25/2019   CHOL 91 11/15/2018   TRIG 39 11/15/2018   HDL 42 11/15/2018   LDLCALC 41 11/15/2018   ALT 20 02/27/2019   AST 18 02/27/2019   NA 139 03/25/2019   K 4.1 03/25/2019   CL 106 03/25/2019   CREATININE 1.16 03/25/2019   BUN 21 03/25/2019   CO2 25 03/25/2019   TSH 23.744 (H) 02/27/2019   INR 1.1 03/25/2019       BNP (last 3 results) Recent Labs    11/14/18 1717  BNP 82.0    ProBNP (last 3 results) No results for input(s): PROBNP in the last 8760 hours.   Other Studies Reviewed Today:  CARDIAC CATH: 11/18/2018 1. Severe native vessel coronary artery disease with total occlusion of the RCA, total occlusion of left circumflex, and severe stenosis leading into total occlusion of  the mid LAD 2. Status post multivessel coronary bypass surgery with  continued patency of the saphenous vein graft RCA, sequential saphenous vein graft to OM1 and distal circumflex, and LIMA to diagonal and mid LAD 3. Severe stenosis involving the ostium/proximal portion of the SVG to distal RCA, treated successfully with a 3.5 x 20 mm Synergy DES utilizing distal embolic protection  Recommendations: Dual antiplatelet therapy with aspirin and clopidogrel 12 months as tolerated Intervention   ECHO: 11/15/2018  1. The left ventricle has normal systolic function with an ejection fraction of 60-65%. The cavity size was normal. Left ventricular diastolic Doppler parameters are consistent with impaired relaxation. Elevated left atrial and left ventricular  end-diastolic pressures The E/e' is >15. No evidence of left ventricular regional wall motion abnormalities. 2. Left atrial size was moderately dilated. 3. The mitral valve is abnormal. Mild thickening of the mitral valve leaflet. Mild calcification of the mitral valve leaflet. There is mild to moderate mitral annular calcification present. 4. The tricuspid valve is grossly normal. 5. The aortic valve is abnormal. Moderate calcification of the aortic valve. Aortic valve regurgitation is mild to moderate by color flow Doppler. No stenosis of the aortic valve.  Assessment/Plan:  1. Prior admission x 2 for diverticulitis with microperforation - doing well. This issue seems resolved.   2. CAD - s/p recent PCI/DES to theSVG-distal RCA- sounds like his angina is now resolved.Unfortunately, his PCI has not really improved his fatigue.Has extensive diffusenativeCAD - no further options felt to be neededfor revascularization- trying to manage medically and overall this remains a very difficult situation -he has been placed on Ranexa. Remains on DAPT as well.   3. HTN - off Florinef now - BP is ok.   4. HLD - on statin  5.  Thrombocytopenia  6. Profound fatigue-still profound. I think he may be a bit better - very challenging situation and probably not a lot to do - he does not like to accept his current situation.   7. Markedly abnormal TSH -managed by PCP - asking for lab here today - will check here.   8. Memory disorder- has been on Aricept for about a year now.I still think depression is playing a role - may be a bit better - sounds like he may have had something added to his regimen - they do not know the name.   9. COVID-19 Education: The signs and symptoms of COVID-19 were discussed with the patient and how to seek care for testing (follow up with PCP or arrange E-visit).  The importance of social distancing, staying at home, hand hygiene and wearing a mask when out in public were discussed today.  Current medicines are reviewed with the patient today.  The patient does not have concerns regarding medicines other than what has been noted above.  The following changes have been made:  See above.  Labs/ tests ordered today include:    Orders Placed This Encounter  Procedures  . Basic metabolic panel  . CBC no Diff  . Hepatic function panel  . TSH     Disposition:   FU with me in 3 months.   Patient is agreeable to this plan and will call if any problems develop in the interim.   SignedTruitt Merle, NP  04/29/2019 4:10 PM  [facility] [address] [place], [place]  [address] Phone: [phone] Fax: [phone]

## 2019-04-29 ENCOUNTER — Other Ambulatory Visit: Payer: Self-pay

## 2019-04-29 ENCOUNTER — Ambulatory Visit (INDEPENDENT_AMBULATORY_CARE_PROVIDER_SITE_OTHER): Payer: Medicare Other | Admitting: Nurse Practitioner

## 2019-04-29 ENCOUNTER — Encounter: Payer: Self-pay | Admitting: Nurse Practitioner

## 2019-04-29 VITALS — BP 140/70 | HR 62 | Ht 72.0 in | Wt 159.0 lb

## 2019-04-29 DIAGNOSIS — E0590 Thyrotoxicosis, unspecified without thyrotoxic crisis or storm: Secondary | ICD-10-CM

## 2019-04-29 DIAGNOSIS — I200 Unstable angina: Secondary | ICD-10-CM

## 2019-04-29 DIAGNOSIS — I2510 Atherosclerotic heart disease of native coronary artery without angina pectoris: Secondary | ICD-10-CM

## 2019-04-29 DIAGNOSIS — R5383 Other fatigue: Secondary | ICD-10-CM | POA: Diagnosis not present

## 2019-04-29 DIAGNOSIS — E782 Mixed hyperlipidemia: Secondary | ICD-10-CM | POA: Diagnosis not present

## 2019-04-29 NOTE — Patient Instructions (Addendum)
After Visit Summary:  We will be checking the following labs today - BMET, CBC, HPF and TSH  Medication Instructions:    Continue with your current medicines.    If you need a refill on your cardiac medications before your next appointment, please call your pharmacy.     Testing/Procedures To Be Arranged:  N/A  Follow-Up:   See me in 3 months    At [facility], you and your health needs are our priority.  As part of our continuing mission to provide you with exceptional heart care, we have created designated Provider Care Teams.  These Care Teams include your primary Cardiologist (physician) and Advanced Practice Providers (APPs -  Physician Assistants and Nurse Practitioners) who all work together to provide you with the care you need, when you need it.  Special Instructions:  . Stay safe, stay home, wash your hands for at least 20 seconds and wear a mask when out in public.  . It was good to talk with you today.    Call the [place] office at [phone] if you have any questions, problems or concerns.

## 2019-04-30 ENCOUNTER — Other Ambulatory Visit: Payer: Self-pay | Admitting: Nurse Practitioner

## 2019-04-30 LAB — CBC
Hematocrit: 40.4 % (ref 37.5–51.0)
Hemoglobin: 14 g/dL (ref 13.0–17.7)
MCH: 34.1 pg — ABNORMAL HIGH (ref 26.6–33.0)
MCHC: 34.7 g/dL (ref 31.5–35.7)
MCV: 98 fL — ABNORMAL HIGH (ref 79–97)
Platelets: 121 10*3/uL — ABNORMAL LOW (ref 150–450)
RBC: 4.11 x10E6/uL — ABNORMAL LOW (ref 4.14–5.80)
RDW: 12.6 % (ref 11.6–15.4)
WBC: 4.9 10*3/uL (ref 3.4–10.8)

## 2019-04-30 LAB — TSH: TSH: 3.33 u[IU]/mL (ref 0.450–4.500)

## 2019-04-30 LAB — BASIC METABOLIC PANEL
BUN/Creatinine Ratio: 16 (ref 10–24)
BUN: 17 mg/dL (ref 10–36)
CO2: 23 mmol/L (ref 20–29)
Calcium: 9.5 mg/dL (ref 8.6–10.2)
Chloride: 102 mmol/L (ref 96–106)
Creatinine, Ser: 1.09 mg/dL (ref 0.76–1.27)
GFR calc Af Amer: 69 mL/min/{1.73_m2} (ref >59)
GFR calc non Af Amer: 59 mL/min/{1.73_m2} — ABNORMAL LOW (ref >59)
Glucose: 92 mg/dL (ref 65–99)
Potassium: 4.6 mmol/L (ref 3.5–5.2)
Sodium: 141 mmol/L (ref 134–144)

## 2019-04-30 LAB — HEPATIC FUNCTION PANEL
ALT: 8 IU/L (ref 0–44)
AST: 16 IU/L (ref 0–40)
Albumin: 4.8 g/dL — ABNORMAL HIGH (ref 3.5–4.6)
Alkaline Phosphatase: 82 IU/L (ref 39–117)
Bilirubin Total: 0.6 mg/dL (ref 0.0–1.2)
Bilirubin, Direct: 0.21 mg/dL (ref 0.00–0.40)
Total Protein: 7 g/dL (ref 6.0–8.5)

## 2019-06-03 DIAGNOSIS — H33001 Unspecified retinal detachment with retinal break, right eye: Secondary | ICD-10-CM | POA: Diagnosis not present

## 2019-06-03 DIAGNOSIS — H5213 Myopia, bilateral: Secondary | ICD-10-CM | POA: Diagnosis not present

## 2019-06-05 NOTE — Progress Notes (Signed)
CARDIOLOGY OFFICE NOTE  Date:  06/11/2019    Patrick [name] Date of Birth: 04-28-1927 Medical Record [id number]  PCP:  Patrick Redwood, MD  Cardiologist:  Patrick [name] & Patrick [name]    Chief Complaint  Patient presents with  . Follow-up    Work in visit - seen for Dr. Acie [name]    History of Present Illness: Patrick [name] is a 86 y.o. male who presents today for a work in visit. Seen for Dr. Acie [name]. He is a former patient of Dr. Susa [name]. He is primarily seeing me now.   He has a known history of CAD with remote CABG from 78 (Patrick [name]), HTN, HLD, hypothyroidism and GERD.   His wife reached out to me just before the July 4th holiday weekend - he was having marked SOB and unstable angina -had not been doing well for several weeks -he was referred for admission.His cardiac enzymes were negative for MI and his ECG was nonacute. An echocardiogram showed preserved left ventricular function with no wall motion abnormalities, butthere iscalciumon both mitral and aortic valves.   He underwent cardiac cath with Dr. Burt [name] on7/10/2018 withDES to the SVG-distal RCA. He was noted to have thrombocytopenia -withno bleeding issues. BP was elevated and his ARB was increased. He has resting bradycardia and is on beta blocker therapy- this was not changed.  His TSH was markedly low -he has had his dose of Synthroid reduced, then stopped and now back onincreasingdose.   I have seen him as a work in a few times - he has developed marked orthostasis - not clear to me as to why - ended up off most of his medicines - was placed on Florinef with some improvement. He saw Dr. Acie [name] just a few weeks ago - cath films again reviewed - lots of native disease - trying to manage medically - he hadbeen placed on Ranexa. Then developed abdominal abscess from diverticulitis and was back in the hospital. 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   He has  continued to feel bad, very tired - very angry about his overall situation. Last seen in December - thyroid level had finally leveled out.   Wife reached out to me last week - he is sleeping more - extremities cold and blue. He wanted to be seen.   The patient does not have symptoms concerning for COVID-19 infection (fever, chills, cough, or new shortness of breath).   Comes in today. Here with his wife [name] - she augments the history. He remains tired. Having more issues with his legs - both are numb - turn blue at times. He notes having more issues with balance and walking. He says that he sometimes has to focus on his brain to tell his legs to move. No chest pain. Sleeping 13 to 16 hours a day - still using Tylenol PM at night and now on Zoloft that he takes in the AM. Not crying - this is better. Still not really doing much. Has had recent fall with getting out of the car. She is worried about his extremities turning blue. His ABIs were all normal back in 2018.   Past Medical History:  Diagnosis Date  . BPH (benign prostatic hypertrophy)   . Diverticulosis   . ED (erectile dysfunction)   . GERD (gastroesophageal reflux disease)   . Gout   . Hepatitis    hx hepatitis after mono as teenager  .  History of kidney stones   . History of skin cancer   . Hyperlipidemia   . Hypertension   . Hypothyroidism   . IHD (ischemic heart disease)    Remote CABG in 1997  . MI, acute, non ST segment elevation ([place]) 2010   s/p cath with occluded SVG to PD that fills by collaterals and the remainder of his revasculsarization is satisfactory. "the first time they did the stent , the second time they did the graft 8 vessels"  . Nocturia   . Thrombocytopenia (Patrick [name]) 11/19/2018  . Urinary frequency     Past Surgical History:  Procedure Laterality Date  . APPENDECTOMY    . BALLOON DILATION N/A 03/25/2019   Procedure: BALLOON DILATION;  Surgeon: Patrick Brace, MD;  Location: [facility];  Service:  Gastroenterology;  Laterality: N/A;  . CARDIAC CATHETERIZATION  09/08/2008   NORMAL. EF 60%; Occluded SVG to PD that fills by collaterals and the remainder of his revascularization is satisfactory. he is managed medically.   . CORONARY ARTERY BYPASS GRAFT  1997   CABG x 8  . CORONARY STENT INTERVENTION N/A 11/18/2018   Procedure: CORONARY STENT INTERVENTION;  Surgeon: Patrick Mocha, MD;  Location: [facility];  Service: Cardiovascular;  Laterality: N/A;  . CYSTOSCOPY WITH INSERTION OF UROLIFT N/A 03/19/2015   Procedure: CYSTOSCOPY WITH INSERTION OF FOUR UROLIFTS;  Surgeon: Patrick Clines, MD;  Location: [facility];  Service: Urology;  Laterality: N/A;  . ESOPHAGOGASTRODUODENOSCOPY (EGD) WITH PROPOFOL N/A 02/12/2017   Procedure: ESOPHAGOGASTRODUODENOSCOPY (EGD) WITH PROPOFOL;  Surgeon: Patrick Brace, MD;  Location: [facility];  Service: Gastroenterology;  Laterality: N/A;  . ESOPHAGOGASTRODUODENOSCOPY (EGD) WITH PROPOFOL N/A 07/13/2018   Procedure: ESOPHAGOGASTRODUODENOSCOPY (EGD) WITH PROPOFOL;  Surgeon: Patrick Essex, MD;  Location: [facility];  Service: Endoscopy;  Laterality: N/A;  . ESOPHAGOGASTRODUODENOSCOPY (EGD) WITH PROPOFOL N/A 03/25/2019   Procedure: ESOPHAGOGASTRODUODENOSCOPY (EGD) WITH PROPOFOL;  Surgeon: Patrick Brace, MD;  Location: [facility];  Service: Gastroenterology;  Laterality: N/A;  . FOREIGN BODY REMOVAL N/A 07/13/2018   Procedure: FOREIGN BODY REMOVAL;  Surgeon: Patrick Essex, MD;  Location: [facility];  Service: Endoscopy;  Laterality: N/A;  . LEFT HEART CATH AND CORS/GRAFTS ANGIOGRAPHY N/A 11/18/2018   Procedure: LEFT HEART CATH AND CORS/GRAFTS ANGIOGRAPHY;  Surgeon: Patrick Mocha, MD;  Location: [facility];  Service: Cardiovascular;  Laterality: N/A;     Medications: Current Meds  Medication Sig  . allopurinol (ZYLOPRIM) 300 MG tablet Take 300 mg by mouth daily.  Marland Kitchen aspirin 81 MG tablet Take 81 mg by mouth daily.    Marland Kitchen atorvastatin (LIPITOR)  20 MG tablet Take 1 tablet (20 mg total) by mouth daily.  . clopidogrel (PLAVIX) 75 MG tablet Take 1 tablet (75 mg total) by mouth daily with breakfast.  . donepezil (ARICEPT) 10 MG tablet Take 10 mg by mouth at bedtime.   . dorzolamide-timolol (COSOPT) 22.3-6.8 MG/ML ophthalmic solution Place 1 drop into the right eye 2 (two) times daily. Does in morning and at dinner time.  Marland Kitchen levothyroxine (SYNTHROID) 100 MCG tablet Take 1 tablet (100 mcg total) by mouth daily before breakfast.  . nitroGLYCERIN (NITROSTAT) 0.4 MG SL tablet PLACE 1 TABLET (0.4 MG TOTAL) UNDER THE TONGUE EVERY 5 (FIVE) MINUTES AS NEEDED.  Marland Kitchen NONFORMULARY OR COMPOUNDED ITEM Elderly male with impaired mobility, Benefits from outpatient PT per physical therapy.  . pantoprazole (PROTONIX) 40 MG tablet Take 1 tablet (40 mg total) by mouth daily before breakfast.  . ranolazine (RANEXA) 500 MG 12  hr tablet Take 500 mg by mouth 2 (two) times daily.  . sertraline (ZOLOFT) 50 MG tablet Take 50 mg by mouth daily.  . tamsulosin (FLOMAX) 0.4 MG CAPS capsule Take 0.4 mg by mouth daily.   . Vitamin D, Ergocalciferol, (DRISDOL) 50000 units CAPS capsule Take 50,000 Units by mouth once a week.     Allergies: Allergies  Allergen Reactions  . Halcion [Triazolam]     "Makes me crazy"  . Pravachol     Unknown    Social History: The patient  reports that he quit smoking about 61 years ago. His smoking use included cigarettes. He has a 15.00 pack-year smoking history. He has never used smokeless tobacco. He reports current alcohol use. He reports that he does not use drugs.   Family History: The patient's family history includes Heart failure (age of onset: 32) in his mother.   Review of Systems: Please see the history of present illness.   All other systems are reviewed and negative.   Physical Exam: VS:  BP 126/64   Pulse 73   Ht 6' (1.829 m)   Wt 159 lb (72.1 kg)   SpO2 98%   BMI 21.56 kg/m  .  BMI Body mass index is 21.56  kg/m.  Wt Readings from Last 3 Encounters:  06/11/19 159 lb (72.1 kg)  04/29/19 159 lb (72.1 kg)  03/11/19 154 lb (69.9 kg)    General: Pleasant. He looks younger than his stated age. Alert and in no acute distress. Not tearful today. Not as angry today.    HEENT: Normal.  Neck: Supple, no JVD, carotid bruits, or masses noted.  Cardiac: Regular rate and rhythm. No murmurs, rubs, or gallops. No edema. Decreased peripheral pulses noted.  Respiratory:  Lungs are clear to auscultation bilaterally with normal work of breathing.  GI: Soft and nontender.  MS: No deformity or atrophy. Gait and ROM intact.  Skin: Warm and dry. Color is normal.  Neuro:  Strength and sensation are intact and no gross focal deficits noted.  Psych: Alert, appropriate and with normal affect.   LABORATORY DATA:  EKG:  EKG is not ordered today.   Lab Results  Component Value Date   WBC 4.9 04/29/2019   HGB 14.0 04/29/2019   HCT 40.4 04/29/2019   PLT 121 (L) 04/29/2019   GLUCOSE 92 04/29/2019   CHOL 91 11/15/2018   TRIG 39 11/15/2018   HDL 42 11/15/2018   LDLCALC 41 11/15/2018   ALT 8 04/29/2019   AST 16 04/29/2019   NA 141 04/29/2019   K 4.6 04/29/2019   CL 102 04/29/2019   CREATININE 1.09 04/29/2019   BUN 17 04/29/2019   CO2 23 04/29/2019   TSH 3.330 04/29/2019   INR 1.1 03/25/2019       BNP (last 3 results) Recent Labs    11/14/18 1717  BNP 82.0    ProBNP (last 3 results) No results for input(s): PROBNP in the last 8760 hours.   Other Studies Reviewed Today:  CARDIAC CATH: 11/18/2018 1. Severe native vessel coronary artery disease with total occlusion of the RCA, total occlusion of left circumflex, and severe stenosis leading into total occlusion of the mid LAD 2. Status post multivessel coronary bypass surgery with continued patency of the saphenous vein graft RCA, sequential saphenous vein graft to OM1 and distal circumflex, and LIMA to diagonal and mid LAD 3. Severe stenosis  involving the ostium/proximal portion of the SVG to distal RCA, treated successfully with a  3.5 x 20 mm Synergy DES utilizing distal embolic protection  Recommendations: Dual antiplatelet therapy with aspirin and clopidogrel 12 months as tolerated Intervention   ECHO: 11/15/2018  1. The left ventricle has normal systolic function with an ejection fraction of 60-65%. The cavity size was normal. Left ventricular diastolic Doppler parameters are consistent with impaired relaxation. Elevated left atrial and left ventricular  end-diastolic pressures The E/e' is >15. No evidence of left ventricular regional wall motion abnormalities. 2. Left atrial size was moderately dilated. 3. The mitral valve is abnormal. Mild thickening of the mitral valve leaflet. Mild calcification of the mitral valve leaflet. There is mild to moderate mitral annular calcification present. 4. The tricuspid valve is grossly normal. 5. The aortic valve is abnormal. Moderate calcification of the aortic valve. Aortic valve regurgitation is mild to moderate by color flow Doppler. No stenosis of the aortic valve.   Assessment/Plan:  1. Continued fatigue - most likely multifactorial - given extent of CAD and age - seems unchanged to me.   2. Leg numbness - has had prior normal ABIs - they would like this repeated.   3. Prior admission x 2 for diverticulitis with microperforation - not discussed today.   4. CAD - prior PCI/DES to SVG to distal RCA - no more angina - however this has not really improved his overall situation. He is on Ranexa. Trying to manage medically. I think the fatigue is multifactorial and will be chronic.   5. HTN - BP is fine - no longer needing Florinef  6. HLD - on statin  7. Memory disorder - this may be impacting his balance more now.   8. Hypothyroid - TSH now normal.   67. Advanced age - he is not happy about getting older - long discussion again today.   10. COVID-19  Education: The signs and symptoms of COVID-19 were discussed with the patient and how to seek care for testing (follow up with PCP or arrange E-visit).  The importance of social distancing, staying at home, hand hygiene and wearing a mask when out in public were discussed today. He has had his first COVID vaccine last week.   Current medicines are reviewed with the patient today.  The patient does not have concerns regarding medicines other than what has been noted above.  The following changes have been made:  See above.  Labs/ tests ordered today include:    Orders Placed This Encounter  Procedures  . VAS [place] LOWER EXTREMITY ARTERIAL DUPLEX  . VAS [place] ABI WITH/WO TBI     Disposition:   FU with me as planned in March.   Patient is agreeable to this plan and will call if any problems develop in the interim.   SignedTruitt Merle, NP  06/11/2019 12:31 PM  [facility] [address] [place], [place]  [address] Phone: [phone] Fax: [phone]

## 2019-06-11 ENCOUNTER — Encounter: Payer: Self-pay | Admitting: Nurse Practitioner

## 2019-06-11 ENCOUNTER — Ambulatory Visit (INDEPENDENT_AMBULATORY_CARE_PROVIDER_SITE_OTHER): Payer: Medicare Other | Admitting: Nurse Practitioner

## 2019-06-11 ENCOUNTER — Other Ambulatory Visit: Payer: Self-pay

## 2019-06-11 ENCOUNTER — Encounter: Payer: Self-pay | Admitting: *Deleted

## 2019-06-11 VITALS — BP 126/64 | HR 73 | Ht 72.0 in | Wt 159.0 lb

## 2019-06-11 DIAGNOSIS — R200 Anesthesia of skin: Secondary | ICD-10-CM | POA: Diagnosis not present

## 2019-06-11 DIAGNOSIS — I2510 Atherosclerotic heart disease of native coronary artery without angina pectoris: Secondary | ICD-10-CM

## 2019-06-11 DIAGNOSIS — R5383 Other fatigue: Secondary | ICD-10-CM | POA: Diagnosis not present

## 2019-06-11 DIAGNOSIS — Z7189 Other specified counseling: Secondary | ICD-10-CM

## 2019-06-11 DIAGNOSIS — E782 Mixed hyperlipidemia: Secondary | ICD-10-CM | POA: Diagnosis not present

## 2019-06-11 DIAGNOSIS — I10 Essential (primary) hypertension: Secondary | ICD-10-CM | POA: Diagnosis not present

## 2019-06-11 NOTE — Patient Instructions (Addendum)
After Visit Summary:  We will be checking the following labs today - NONE   Medication Instructions:    Continue with your current medicines.   Let's make sure this list of medicine matches up to what you are taking at home.  Change the Zoloft to night time - not day time.   STOP the Tylenol PM.    If you need a refill on your cardiac medications before your next appointment, please call your pharmacy.     Testing/Procedures To Be Arranged:  Lower extremity arterial dopplers  Follow-Up:   See me back as planned in March     At [facility], you and your health needs are our priority.  As part of our continuing mission to provide you with exceptional heart care, we have created designated Provider Care Teams.  These Care Teams include your primary Cardiologist (physician) and Advanced Practice Providers (APPs -  Physician Assistants and Nurse Practitioners) who all work together to provide you with the care you need, when you need it.  Special Instructions:  . Stay safe, stay home, wash your hands for at least 20 seconds and wear a mask when out in public.     Call the [place] office at [phone] if you have any questions, problems or concerns.

## 2019-06-13 ENCOUNTER — Other Ambulatory Visit: Payer: Self-pay

## 2019-06-13 ENCOUNTER — Ambulatory Visit (HOSPITAL_COMMUNITY)
Admission: RE | Admit: 2019-06-13 | Discharge: 2019-06-13 | Disposition: A | Discharge status: Home / Self Care | Payer: Medicare Other | Source: Ambulatory Visit | Attending: Internal Medicine | Admitting: Internal Medicine

## 2019-06-13 DIAGNOSIS — R202 Paresthesia of skin: Secondary | ICD-10-CM

## 2019-06-13 DIAGNOSIS — R200 Anesthesia of skin: Secondary | ICD-10-CM

## 2019-06-16 ENCOUNTER — Telehealth: Payer: Self-pay | Admitting: *Deleted

## 2019-06-16 NOTE — Telephone Encounter (Signed)
S/w pt's wife per [facility]) is aware of LEA with ABI results. Accidentally erased without resulting.

## 2019-06-18 ENCOUNTER — Other Ambulatory Visit: Payer: Self-pay | Admitting: Cardiovascular Disease

## 2019-06-22 ENCOUNTER — Observation Stay (HOSPITAL_COMMUNITY)
Admission: EM | Admit: 2019-06-22 | Discharge: 2019-06-24 | Disposition: A | Discharge status: Home / Self Care | Payer: Medicare Other | Attending: Internal Medicine | Admitting: Internal Medicine

## 2019-06-22 DIAGNOSIS — Z888 Allergy status to other drugs, medicaments and biological substances status: Secondary | ICD-10-CM | POA: Diagnosis not present

## 2019-06-22 DIAGNOSIS — Z79899 Other long term (current) drug therapy: Secondary | ICD-10-CM | POA: Diagnosis not present

## 2019-06-22 DIAGNOSIS — Z20822 Contact with and (suspected) exposure to covid-19: Secondary | ICD-10-CM | POA: Diagnosis not present

## 2019-06-22 DIAGNOSIS — D696 Thrombocytopenia, unspecified: Secondary | ICD-10-CM | POA: Insufficient documentation

## 2019-06-22 DIAGNOSIS — Z7982 Long term (current) use of aspirin: Secondary | ICD-10-CM | POA: Diagnosis not present

## 2019-06-22 DIAGNOSIS — Z7902 Long term (current) use of antithrombotics/antiplatelets: Secondary | ICD-10-CM | POA: Insufficient documentation

## 2019-06-22 DIAGNOSIS — I252 Old myocardial infarction: Secondary | ICD-10-CM | POA: Diagnosis not present

## 2019-06-22 DIAGNOSIS — Z87891 Personal history of nicotine dependence: Secondary | ICD-10-CM | POA: Insufficient documentation

## 2019-06-22 DIAGNOSIS — I2510 Atherosclerotic heart disease of native coronary artery without angina pectoris: Secondary | ICD-10-CM | POA: Insufficient documentation

## 2019-06-22 DIAGNOSIS — R0789 Other chest pain: Principal | ICD-10-CM | POA: Insufficient documentation

## 2019-06-22 DIAGNOSIS — E039 Hypothyroidism, unspecified: Secondary | ICD-10-CM | POA: Diagnosis not present

## 2019-06-22 DIAGNOSIS — Z951 Presence of aortocoronary bypass graft: Secondary | ICD-10-CM | POA: Diagnosis not present

## 2019-06-22 DIAGNOSIS — R079 Chest pain, unspecified: Secondary | ICD-10-CM | POA: Diagnosis not present

## 2019-06-22 DIAGNOSIS — N179 Acute kidney failure, unspecified: Secondary | ICD-10-CM | POA: Insufficient documentation

## 2019-06-22 DIAGNOSIS — I10 Essential (primary) hypertension: Secondary | ICD-10-CM | POA: Insufficient documentation

## 2019-06-22 NOTE — ED Notes (Signed)
Can call Maninder Popelka at [phone] with questions about patient or medications.

## 2019-06-23 ENCOUNTER — Emergency Department (HOSPITAL_COMMUNITY): Payer: Medicare Other

## 2019-06-23 ENCOUNTER — Other Ambulatory Visit: Payer: Self-pay

## 2019-06-23 ENCOUNTER — Encounter (HOSPITAL_COMMUNITY): Payer: Self-pay

## 2019-06-23 DIAGNOSIS — I2510 Atherosclerotic heart disease of native coronary artery without angina pectoris: Secondary | ICD-10-CM | POA: Diagnosis not present

## 2019-06-23 DIAGNOSIS — E039 Hypothyroidism, unspecified: Secondary | ICD-10-CM | POA: Diagnosis not present

## 2019-06-23 DIAGNOSIS — I10 Essential (primary) hypertension: Secondary | ICD-10-CM | POA: Diagnosis not present

## 2019-06-23 DIAGNOSIS — N179 Acute kidney failure, unspecified: Secondary | ICD-10-CM | POA: Diagnosis present

## 2019-06-23 DIAGNOSIS — R0789 Other chest pain: Secondary | ICD-10-CM | POA: Diagnosis not present

## 2019-06-23 DIAGNOSIS — R079 Chest pain, unspecified: Secondary | ICD-10-CM | POA: Diagnosis present

## 2019-06-23 DIAGNOSIS — D696 Thrombocytopenia, unspecified: Secondary | ICD-10-CM | POA: Diagnosis not present

## 2019-06-23 DIAGNOSIS — Z20822 Contact with and (suspected) exposure to covid-19: Secondary | ICD-10-CM | POA: Diagnosis not present

## 2019-06-23 LAB — BASIC METABOLIC PANEL
Anion gap: 12 (ref 5–15)
Anion gap: 9 (ref 5–15)
BUN: 16 mg/dL (ref 8–23)
BUN: 17 mg/dL (ref 8–23)
CO2: 23 mmol/L (ref 22–32)
CO2: 24 mmol/L (ref 22–32)
Calcium: 9.1 mg/dL (ref 8.9–10.3)
Calcium: 8.7 mg/dL — ABNORMAL LOW (ref 8.9–10.3)
Chloride: 103 mmol/L (ref 98–111)
Chloride: 104 mmol/L (ref 98–111)
Creatinine, Ser: 1.55 mg/dL — ABNORMAL HIGH (ref 0.61–1.24)
Creatinine, Ser: 1.56 mg/dL — ABNORMAL HIGH (ref 0.61–1.24)
GFR calc Af Amer: 45 mL/min — ABNORMAL LOW (ref >60)
GFR calc Af Amer: 45 mL/min — ABNORMAL LOW (ref >60)
GFR calc non Af Amer: 39 mL/min — ABNORMAL LOW (ref >60)
GFR calc non Af Amer: 39 mL/min — ABNORMAL LOW (ref >60)
Glucose, Bld: 135 mg/dL — ABNORMAL HIGH (ref 70–99)
Glucose, Bld: 133 mg/dL — ABNORMAL HIGH (ref 70–99)
Potassium: 4.4 mmol/L (ref 3.5–5.1)
Potassium: 4.3 mmol/L (ref 3.5–5.1)
Sodium: 138 mmol/L (ref 135–145)
Sodium: 137 mmol/L (ref 135–145)

## 2019-06-23 LAB — TROPONIN I (HIGH SENSITIVITY)
Troponin I (High Sensitivity): 9 ng/L
Troponin I (High Sensitivity): 9 ng/L (ref <18)

## 2019-06-23 LAB — CBC
HCT: 42.8 % (ref 39.0–52.0)
Hemoglobin: 14.1 g/dL (ref 13.0–17.0)
MCH: 32.9 pg (ref 26.0–34.0)
MCHC: 32.9 g/dL (ref 30.0–36.0)
MCV: 99.8 fL (ref 80.0–100.0)
Platelets: 101 K/uL — ABNORMAL LOW (ref 150–400)
RBC: 4.29 MIL/uL (ref 4.22–5.81)
RDW: 13 % (ref 11.5–15.5)
WBC: 6.1 K/uL (ref 4.0–10.5)
nRBC: 0 % (ref 0.0–0.2)

## 2019-06-23 LAB — SARS CORONAVIRUS 2 (TAT 6-24 HRS): SARS Coronavirus 2: NEGATIVE

## 2019-06-23 LAB — GLUCOSE, CAPILLARY: Glucose-Capillary: 79 mg/dL (ref 70–99)

## 2019-06-23 MED ORDER — ATORVASTATIN CALCIUM 10 MG PO TABS
20.0000 mg | ORAL_TABLET | Freq: Every day | ORAL | Status: DC
  Administered 2019-06-23 – 2019-06-24 (×2): 20 mg via ORAL
  Filled 2019-06-23 (×2): qty 2

## 2019-06-23 MED ORDER — SODIUM CHLORIDE 0.9 % IV BOLUS (SEPSIS)
500.0000 mL | Freq: Once | INTRAVENOUS | Status: AC
  Administered 2019-06-23: 500 mL via INTRAVENOUS

## 2019-06-23 MED ORDER — SERTRALINE HCL 50 MG PO TABS
50.0000 mg | ORAL_TABLET | Freq: Every day | ORAL | Status: DC
  Administered 2019-06-23 – 2019-06-24 (×2): 50 mg via ORAL
  Filled 2019-06-23 (×2): qty 1

## 2019-06-23 MED ORDER — SODIUM CHLORIDE 0.9 % IV SOLN: INTRAVENOUS | Status: DC

## 2019-06-23 MED ORDER — ASPIRIN 81 MG PO CHEW
324.0000 mg | CHEWABLE_TABLET | Freq: Once | ORAL | Status: AC
  Administered 2019-06-23: 324 mg via ORAL
  Filled 2019-06-23: qty 4

## 2019-06-23 MED ORDER — DONEPEZIL HCL 10 MG PO TABS
10.0000 mg | ORAL_TABLET | Freq: Every day | ORAL | Status: DC
  Administered 2019-06-23 (×2): 10 mg via ORAL
  Filled 2019-06-23 (×2): qty 1

## 2019-06-23 MED ORDER — ACETAMINOPHEN 500 MG PO TABS
1000.0000 mg | ORAL_TABLET | Freq: Once | ORAL | Status: AC
  Administered 2019-06-23: 1000 mg via ORAL
  Filled 2019-06-23: qty 2

## 2019-06-23 MED ORDER — ASPIRIN 81 MG PO CHEW
81.0000 mg | CHEWABLE_TABLET | Freq: Every day | ORAL | Status: DC
  Administered 2019-06-23 – 2019-06-24 (×2): 81 mg via ORAL
  Filled 2019-06-23 (×2): qty 1

## 2019-06-23 MED ORDER — ONDANSETRON HCL 4 MG/2ML IJ SOLN: 4.0000 mg | Freq: Four times a day (QID) | INTRAMUSCULAR | Status: DC | PRN

## 2019-06-23 MED ORDER — ALLOPURINOL 300 MG PO TABS
300.0000 mg | ORAL_TABLET | Freq: Every day | ORAL | Status: DC
  Administered 2019-06-23 – 2019-06-24 (×2): 300 mg via ORAL
  Filled 2019-06-23 (×2): qty 1

## 2019-06-23 MED ORDER — ACETAMINOPHEN 325 MG PO TABS: 650.0000 mg | ORAL_TABLET | ORAL | Status: DC | PRN

## 2019-06-23 MED ORDER — DORZOLAMIDE HCL-TIMOLOL MAL 2-0.5 % OP SOLN
1.0000 [drp] | Freq: Two times a day (BID) | OPHTHALMIC | Status: DC
  Administered 2019-06-23 (×2): 1 [drp] via OPHTHALMIC
  Filled 2019-06-23: qty 10

## 2019-06-23 MED ORDER — ONDANSETRON HCL 4 MG/2ML IJ SOLN
4.0000 mg | Freq: Once | INTRAMUSCULAR | Status: AC
  Administered 2019-06-23: 4 mg via INTRAVENOUS
  Filled 2019-06-23: qty 2

## 2019-06-23 MED ORDER — RANOLAZINE ER 500 MG PO TB12
500.0000 mg | ORAL_TABLET | Freq: Two times a day (BID) | ORAL | Status: DC
  Administered 2019-06-23 – 2019-06-24 (×3): 500 mg via ORAL
  Filled 2019-06-23 (×3): qty 1

## 2019-06-23 MED ORDER — PANTOPRAZOLE SODIUM 40 MG PO TBEC
40.0000 mg | DELAYED_RELEASE_TABLET | Freq: Every day | ORAL | Status: DC
  Administered 2019-06-23 – 2019-06-24 (×2): 40 mg via ORAL
  Filled 2019-06-23 (×2): qty 1

## 2019-06-23 MED ORDER — ENOXAPARIN SODIUM 40 MG/0.4ML ~~LOC~~ SOLN
40.0000 mg | Freq: Every day | SUBCUTANEOUS | Status: DC
  Administered 2019-06-23 (×2): 40 mg via SUBCUTANEOUS
  Filled 2019-06-23 (×2): qty 0.4

## 2019-06-23 MED ORDER — CLOPIDOGREL BISULFATE 75 MG PO TABS
75.0000 mg | ORAL_TABLET | Freq: Every day | ORAL | Status: DC
  Administered 2019-06-23 – 2019-06-24 (×2): 75 mg via ORAL
  Filled 2019-06-23 (×2): qty 1

## 2019-06-23 MED ORDER — LEVOTHYROXINE SODIUM 100 MCG PO TABS
100.0000 ug | ORAL_TABLET | Freq: Every day | ORAL | Status: DC
  Administered 2019-06-23 – 2019-06-24 (×2): 100 ug via ORAL
  Filled 2019-06-23 (×2): qty 1

## 2019-06-23 MED ORDER — NITROGLYCERIN 0.4 MG SL SUBL
0.4000 mg | SUBLINGUAL_TABLET | SUBLINGUAL | Status: DC | PRN
  Administered 2019-06-23 (×2): 0.4 mg via SUBLINGUAL
  Filled 2019-06-23: qty 1

## 2019-06-23 MED ORDER — FENTANYL CITRATE (PF) 100 MCG/2ML IJ SOLN
25.0000 ug | Freq: Once | INTRAMUSCULAR | Status: AC
  Administered 2019-06-23: 25 ug via INTRAVENOUS
  Filled 2019-06-23: qty 2

## 2019-06-23 MED ORDER — TAMSULOSIN HCL 0.4 MG PO CAPS
0.4000 mg | ORAL_CAPSULE | Freq: Every day | ORAL | Status: DC
  Administered 2019-06-23 – 2019-06-24 (×2): 0.4 mg via ORAL
  Filled 2019-06-23 (×2): qty 1

## 2019-06-23 NOTE — ED Notes (Signed)
Admitting team at bedside.

## 2019-06-23 NOTE — ED Notes (Signed)
Pt came to the ED per triage complaint. Pt conscious, breathing, and A&Ox4. Pt brought back to [facility] via wheelchair. Pt endorses "I had chest pain that started yesterday and has no gotten better". Chest rise and fall equally with non-labored breathing. Lungs clear apex to base. Abd soft and non-tender. Pt denies n/v/d, shortness of breath, and f/c. Pt endorses pain 8 out of 10 pain that's pressure like. PIVC placed on the RAC with a 20G which had positive blood return and flushed without pain or infiltration. Blood collected, labeled, and sent to lab. Bed in lowest position with call light within reach. Pt on continuous blood pressure, pulse ox, and cardiac monitor. Will continue to monitor. Awaiting MD eval. No distress noted.

## 2019-06-23 NOTE — ED Provider Notes (Signed)
TIME SEEN: 12:17 AM  CHIEF COMPLAINT: Chest pain  HPI: Patient is a 86 year old male with history of hypertension, hyperlipidemia, CABG in 1997, stent to the distal RCA in June 2020 followed by [facility] cardiology who presents to the emergency department with chest pain that is left of center that he describes as a squeezing pain without radiation that started yesterday.  No aggravating or relieving factors.  No associated shortness of breath, nausea, diaphoresis, dizziness.  States with his previous cardiac issues he has never had pain.  Did not take nitroglycerin at home as he states it is caused him to have a severe headache before.  Blood pressure currently 195/76.  No fever.  Reports chronic nasal congestion and recent dry cough.  No Covid exposures.  PCP - Marton Redwood with [facility]  Cardiologist - Dr. Cathie Olden  ROS: See HPI Constitutional: no fever  Eyes: no drainage  ENT: no runny nose   Cardiovascular:   chest pain  Resp: no SOB  GI: no vomiting GU: no dysuria Integumentary: no rash  Allergy: no hives  Musculoskeletal: no leg swelling  Neurological: no slurred speech ROS otherwise negative  PAST MEDICAL HISTORY/PAST SURGICAL HISTORY:  Past Medical History:  Diagnosis Date  . BPH (benign prostatic hypertrophy)   . Diverticulosis   . ED (erectile dysfunction)   . GERD (gastroesophageal reflux disease)   . Gout   . Hepatitis    hx hepatitis after mono as teenager  . History of kidney stones   . History of skin cancer   . Hyperlipidemia   . Hypertension   . Hypothyroidism   . IHD (ischemic heart disease)    Remote CABG in 1997  . MI, acute, non ST segment elevation ([place]) 2010   s/p cath with occluded SVG to PD that fills by collaterals and the remainder of his revasculsarization is satisfactory. "the first time they did the stent , the second time they did the graft 8 vessels"  . Nocturia   . Thrombocytopenia ([place]) 11/19/2018  . Urinary frequency      MEDICATIONS:  Prior to Admission medications   Medication Sig Start Date End Date Taking? Authorizing Provider  allopurinol (ZYLOPRIM) 300 MG tablet Take 300 mg by mouth daily.    [provider]  aspirin 81 MG tablet Take 81 mg by mouth daily.      [provider]  atorvastatin (LIPITOR) 20 MG tablet Take 1 tablet (20 mg total) by mouth daily. 02/25/18 06/11/19  Daune Perch, NP  clopidogrel (PLAVIX) 75 MG tablet Take 1 tablet (75 mg total) by mouth daily with breakfast. 11/20/18   Barrett, Evelene Croon, PA-C  donepezil (ARICEPT) 10 MG tablet Take 10 mg by mouth at bedtime.  02/13/18   [provider]  dorzolamide-timolol (COSOPT) 22.3-6.8 MG/ML ophthalmic solution Place 1 drop into the right eye 2 (two) times daily. Does in morning and at dinner time. 02/11/19   [provider]  levothyroxine (SYNTHROID) 100 MCG tablet Take 1 tablet (100 mcg total) by mouth daily before breakfast. 02/28/19 06/11/19  Dahal, Marlowe Aschoff, MD  nitroGLYCERIN (NITROSTAT) 0.4 MG SL tablet PLACE 1 TABLET (0.4 MG TOTAL) UNDER THE TONGUE EVERY 5 (FIVE) MINUTES AS NEEDED. 06/24/18   Nahser, Wonda Cheng, MD  NONFORMULARY OR COMPOUNDED ITEM Elderly male with impaired mobility, Benefits from outpatient PT per physical therapy. 02/28/19   Terrilee Croak, MD  pantoprazole (PROTONIX) 40 MG tablet Take 1 tablet (40 mg total) by mouth daily before breakfast. 03/25/19 03/24/20  Otis Brace, MD  ranolazine (RANEXA) 500 MG 12 hr tablet Take 500 mg by mouth 2 (two) times daily.    [provider]  sertraline (ZOLOFT) 50 MG tablet Take 50 mg by mouth daily. 05/28/19   [provider]  tamsulosin (FLOMAX) 0.4 MG CAPS capsule Take 0.4 mg by mouth daily.     [provider]  Vitamin D, Ergocalciferol, (DRISDOL) 50000 units CAPS capsule Take 50,000 Units by mouth once a week. 07/31/17   [provider]    ALLERGIES:  Allergies  Allergen Reactions  . Halcion [Triazolam]      "Makes me crazy"  . Pravachol     Unknown    SOCIAL HISTORY:  Social History   Tobacco Use  . Smoking status: Former Smoker    Packs/day: 1.00    Years: 15.00    Pack years: 15.00    Types: Cigarettes    Quit date: 05/15/1958    Years since quitting: 61.1  . Smokeless tobacco: Never Used  Substance Use Topics  . Alcohol use: Yes    Comment: occasional beer    FAMILY HISTORY: Family History  Problem Relation Age of Onset  . Heart failure Mother 41    EXAM: BP (!) 195/76 (BP Location: Right Arm)   Pulse 74   Temp 98.6 F (37 C) (Oral)   Resp 18   SpO2 100%  CONSTITUTIONAL: Alert and oriented and responds appropriately to questions.  Appears uncomfortable but not in distress.  Nontoxic-appearing.  Afebrile.  Elderly. HEAD: Normocephalic EYES: Conjunctivae clear, pupils appear equal, EOM appear intact ENT: normal nose; moist mucous membranes NECK: Supple, normal ROM CARD: RRR; S1 and S2 appreciated; no murmurs, no clicks, no rubs, no gallops RESP: Normal chest excursion without splinting or tachypnea; breath sounds clear and equal bilaterally; no wheezes, no rhonchi, no rales, no hypoxia or respiratory distress, speaking full sentences ABD/GI: Normal bowel sounds; non-distended; soft, non-tender, no rebound, no guarding, no peritoneal signs, no hepatosplenomegaly BACK:  The back appears normal EXT: Normal ROM in all joints; no deformity noted, no edema; no cyanosis, no calf tenderness or calf swelling on exam SKIN: Normal color for age and race; warm; no rash on exposed skin NEURO: Moves all extremities equally PSYCH: The patient's mood and manner are appropriate.   MEDICAL DECISION MAKING: Patient here with chest pain.  Has significant cardiac history.  Drug-eluting stent placed to the RCA in June 2020.  He is on Plavix and reports compliance.  Also on Ranexa.  Will give aspirin, nitroglycerin here and obtain cardiac labs.  Differential includes ACS.  Less likely PE,  dissection.  Has mild cough but doubt COVID-19 or pneumonia.  Anticipate admission.  Patient comfortable with this plan.   ED PROGRESS: Pain improved significantly after 2 nitroglycerin tablets but blood pressure did drop.  Now a 1/10.  Will give small IV fluid bolus.  Labs show elevated creatinine from baseline.  Today is 1.55 when it was 1.09 04/30/2019.  He has thrombocytopenia which is stable.  Troponin x1 is negative.  Chest x-ray clear.  Will discuss with medicine for admission.   1:38 AM Discussed patient's case with hospitalist, Dr. Alcario Drought.  I have recommended admission and patient (and family if present) agree with this plan. Admitting physician will place admission orders.   I reviewed all nursing notes, vitals, pertinent previous records and interpreted all EKGs, lab and urine results, imaging (as available).     EKG Interpretation  Date/Time:  Monday June 23 2019 00:01:28 EST Ventricular Rate:  67 PR Interval:    QRS Duration: 102 QT Interval:  430 QTC Calculation: 454 R Axis:   -86 Text Interpretation: Sinus rhythm Abnormal R-wave progression, late transition Inferior infarct, old No significant change since last tracing Confirmed by Tyberius Ryner, Cyril Mourning [phone]) on 06/23/2019 12:22:56 AM          Patrick [name] was evaluated in Emergency Department on 06/23/2019 for the symptoms described in the history of present illness. He was evaluated in the context of the global COVID-19 pandemic, which necessitated consideration that the patient might be at risk for infection with the SARS-CoV-2 virus that causes COVID-19. Institutional protocols and algorithms that pertain to the evaluation of patients at risk for COVID-19 are in a state of rapid change based on information released by regulatory bodies including the CDC and federal and state organizations. These policies and algorithms were followed during the patient's care in the ED.  Patient was seen wearing N95, face shield,  gloves.    Sharni Negron, Delice Bison, DO 06/23/19 [phone]

## 2019-06-23 NOTE — Consult Note (Addendum)
Cardiology Consultation:   Patient ID: Patrick [name] MRN: [id number]; DOB: 06-Apr-1927  Admit date: 06/22/2019 Date of Consult: 06/23/2019  Primary Care Provider: Marton Redwood, MD Primary Cardiologist: Mertie Moores, MD & Truitt Merle, NP  Patient Profile:   Patrick [name] is a 86 y.o. male with a hx of CAD with remote CABG from 1997 most recent DES to SVG-distal RCA 11/2018, HTN, HLD, hypothyroidism, thrombocytopenia and GERD who is being seen today for the evaluation of chest pain at the request of Dr. Cathlean Sauer.   Admitted 11/2018 with [place]. Underwent cath s/p  PCI of the saphenous vein graft to the right coronary artery. Detailed cath as below. He was noted to have thrombocytopenia -withno bleeding issues.  Symptoms did not improve despite PCI, he was placed on Imdur by Dr. Acie Fredrickson however later developed orthostasis and required multiple medication adjustment with addition of Florinef at one point.  Dr. Acie Fredrickson has reviewed cath films and trying to manage medically with addition of Ranexa.   Admitted 02/2019 with diverticulitis with microperformation. Not amenable to percutaneous drainage.  Last seen by Truitt Merle 06/11/19. Continued to be fatigue. Legs are bumbs and turns blue at time >> follow up ABIs were normal 06/13/19. His fatigue felt multi-factoria.   History of Present Illness:   Patrick [name] for evaluation of chest pain.  For the past 2 days he had intermittent chest discomfort but did not pay any attention.  Did not require nitroglycerin.  He went to bed after watching Super Bowl however woke up with substernal chest "squeezing pressure which felt like elephant sitting".  More similar to prior angina requiring stenting.  He was brought to ER by wife.  Upon arrival his BP was 195/76. He was given SL nitro x 2. BP dropped to 109/57 which improved after NS bolus 500cc  and now drip. Again hypertensive.  Chest pain-free since admit.  He denies chest pain radiation, shortness of breath,  nausea, vomiting, fever, chills, orthopnea, PND, blood in his stool or urine.  Hs-Troponin negative x 2. Scr 1.55>>1.56. Platelets 101. COVID negative. CXR without active disease.   In past 4 weeks he had 2 episode of hematuria.  No evaluation.  His urine looks dark brown today.  TSH was normal month ago.   Patient reports no energy and sleeping in the bed for approximately 15 hours a day.   Heart Pathway Score:  HEAR Score: 6  Past Medical History:  Diagnosis Date  . BPH (benign prostatic hypertrophy)   . Diverticulosis   . ED (erectile dysfunction)   . GERD (gastroesophageal reflux disease)   . Gout   . Hepatitis    hx hepatitis after mono as teenager  . History of kidney stones   . History of skin cancer   . Hyperlipidemia   . Hypertension   . Hypothyroidism   . IHD (ischemic heart disease)    Remote CABG in 1997  . MI, acute, non ST segment elevation ([place]) 2010   s/p cath with occluded SVG to PD that fills by collaterals and the remainder of his revasculsarization is satisfactory. "the first time they did the stent , the second time they did the graft 8 vessels"  . Nocturia   . Thrombocytopenia ([place]) 11/19/2018  . Urinary frequency     Past Surgical History:  Procedure Laterality Date  . APPENDECTOMY    . BALLOON DILATION N/A 03/25/2019   Procedure: BALLOON DILATION;  Surgeon: Otis Brace, MD;  Location: [facility];  Service:  Gastroenterology;  Laterality: N/A;  . CARDIAC CATHETERIZATION  09/08/2008   NORMAL. EF 60%; Occluded SVG to PD that fills by collaterals and the remainder of his revascularization is satisfactory. he is managed medically.   . CORONARY ARTERY BYPASS GRAFT  1997   CABG x 8  . CORONARY STENT INTERVENTION N/A 11/18/2018   Procedure: CORONARY STENT INTERVENTION;  Surgeon: Sherren Mocha, MD;  Location: [facility];  Service: Cardiovascular;  Laterality: N/A;  . CYSTOSCOPY WITH INSERTION OF UROLIFT N/A 03/19/2015   Procedure: CYSTOSCOPY  WITH INSERTION OF FOUR UROLIFTS;  Surgeon: Carolan Clines, MD;  Location: [facility];  Service: Urology;  Laterality: N/A;  . ESOPHAGOGASTRODUODENOSCOPY (EGD) WITH PROPOFOL N/A 02/12/2017   Procedure: ESOPHAGOGASTRODUODENOSCOPY (EGD) WITH PROPOFOL;  Surgeon: Otis Brace, MD;  Location: [facility];  Service: Gastroenterology;  Laterality: N/A;  . ESOPHAGOGASTRODUODENOSCOPY (EGD) WITH PROPOFOL N/A 07/13/2018   Procedure: ESOPHAGOGASTRODUODENOSCOPY (EGD) WITH PROPOFOL;  Surgeon: Clarene Essex, MD;  Location: [facility];  Service: Endoscopy;  Laterality: N/A;  . ESOPHAGOGASTRODUODENOSCOPY (EGD) WITH PROPOFOL N/A 03/25/2019   Procedure: ESOPHAGOGASTRODUODENOSCOPY (EGD) WITH PROPOFOL;  Surgeon: Otis Brace, MD;  Location: [facility];  Service: Gastroenterology;  Laterality: N/A;  . FOREIGN BODY REMOVAL N/A 07/13/2018   Procedure: FOREIGN BODY REMOVAL;  Surgeon: Clarene Essex, MD;  Location: [facility];  Service: Endoscopy;  Laterality: N/A;  . LEFT HEART CATH AND CORS/GRAFTS ANGIOGRAPHY N/A 11/18/2018   Procedure: LEFT HEART CATH AND CORS/GRAFTS ANGIOGRAPHY;  Surgeon: Sherren Mocha, MD;  Location: [facility];  Service: Cardiovascular;  Laterality: N/A;       Inpatient Medications: Scheduled Meds: . allopurinol  300 mg Oral Daily  . aspirin  81 mg Oral Daily  . atorvastatin  20 mg Oral q1800  . clopidogrel  75 mg Oral Q breakfast  . donepezil  10 mg Oral QHS  . dorzolamide-timolol  1 drop Right Eye BID WC  . enoxaparin (LOVENOX) injection  40 mg Subcutaneous QHS  . levothyroxine  100 mcg Oral QAC breakfast  . pantoprazole  40 mg Oral QAC breakfast  . ranolazine  500 mg Oral BID  . sertraline  50 mg Oral Daily  . tamsulosin  0.4 mg Oral Daily   Continuous Infusions: . sodium chloride 100 mL/hr at 06/23/19 0329   PRN Meds: acetaminophen, nitroGLYCERIN, ondansetron (ZOFRAN) IV  Allergies:    Allergies  Allergen Reactions  . Halcion [Triazolam]     "Makes me crazy"    . Pravachol     Unknown    Social History:   Social History   Socioeconomic History  . Marital status: Married    Spouse name: [name]   . Number of children: Not on file  . Years of education: Not on file  . Highest education level: 12th grade  Occupational History  . Occupation: Retired    Fish farm manager: RETIRED    Comment: was in [employer]  Tobacco Use  . Smoking status: Former Smoker    Packs/day: 1.00    Years: 15.00    Pack years: 15.00    Types: Cigarettes    Quit date: 05/15/1958    Years since quitting: 61.1  . Smokeless tobacco: Never Used  Substance and Sexual Activity  . Alcohol use: Yes    Comment: occasional beer  . Drug use: No  . Sexual activity: Yes  Other Topics Concern  . Not on file  Social History Narrative   Patrick [name] is a 86 year old retired male who lives at home  with his wife, [name].     Social Determinants of Health   Financial Resource Strain: Low Risk   . Difficulty of Paying Living Expenses: Not hard at all  Food Insecurity: No Food Insecurity  . Worried About Charity fundraiser in the Last Year: Never true  . Ran Out of Food in the Last Year: Never true  Transportation Needs: No Transportation Needs  . Lack of Transportation (Medical): No  . Lack of Transportation (Non-Medical): No  Physical Activity:   . Days of Exercise per Week: Not on file  . Minutes of Exercise per Session: Not on file  Stress:   . Feeling of Stress : Not on file  Social Connections: Moderately Isolated  . Frequency of Communication with Friends and Family: Once a week  . Frequency of Social Gatherings with Friends and Family: Once a week  . Attends Religious Services: Never  . Active Member of Clubs or Organizations: No  . Attends Archivist Meetings: Never  . Marital Status: Married  Human resources officer Violence: Not At Risk  . Fear of Current or Ex-Partner: No  . Emotionally Abused: No  . Physically Abused: No  . Sexually Abused: No    Family  History:    Family History  Problem Relation Age of Onset  . Heart failure Mother 20     ROS:  Please see the history of present illness.   All other ROS reviewed and negative.     Physical Exam/Data:   Vitals:   06/23/19 0145 06/23/19 0249 06/23/19 0752 06/23/19 1120  BP: 139/65 136/63 (!) 189/71 (!) 161/64  Pulse: 68 71 68 69  Resp: 15 17    Temp:  98.7 F (37.1 C) 98.4 F (36.9 C) 99.3 F (37.4 C)  TempSrc:  Oral Oral Oral  SpO2: 96% 99% 98% 99%  Weight:  72.8 kg    Height:  6' (1.829 m)      Intake/Output Summary (Last 24 hours) at 06/23/2019 1125 Last data filed at 06/23/2019 1100 Gross per 24 hour  Intake 765.44 ml  Output 655 ml  Net 110.44 ml   Last 3 Weights 06/23/2019 06/11/2019 04/29/2019  Weight (lbs) 160 lb 8 oz 159 lb 159 lb  Weight (kg) 72.802 kg 72.122 kg 72.122 kg     Body mass index is 21.77 kg/m.  General: Pleasant elderly male in no acute distress HEENT: normal Lymph: no adenopathy Neck: no JVD Endocrine:  No thryomegaly Vascular: No carotid bruits; FA pulses 2+ bilaterally without bruits  Cardiac:  normal S1, S2; RRR; + murmur  Lungs:  clear to auscultation bilaterally, no wheezing, rhonchi or rales  Abd: soft, nontender, no hepatomegaly  Ext: no edema Musculoskeletal:  No deformities, BUE and BLE strength normal and equal Skin: warm and dry  Neuro:  CNs 2-12 intact, no focal abnormalities noted Psych:  Normal affect   EKG:  The EKG was personally reviewed and demonstrates:  SR at rate of 67 bpm, poor R wave progression, non specific T wave changes anteriorly  Telemetry:  Telemetry was personally reviewed and demonstrates: Sinus rhythm at rate of 70/80 bpm  Relevant CV Studies:  CORONARY STENT INTERVENTION  11/18/2018  LEFT HEART CATH AND CORS/GRAFTS ANGIOGRAPHY  Conclusion  1.  Severe native vessel coronary artery disease with total occlusion of the RCA, total occlusion of left circumflex, and severe stenosis leading into total occlusion  of the mid LAD 2.  Status post multivessel coronary bypass surgery with continued patency  of the saphenous vein graft RCA, sequential saphenous vein graft to OM1 and distal circumflex, and LIMA to diagonal and mid LAD 3.  Severe stenosis involving the ostium/proximal portion of the SVG to distal RCA, treated successfully with a 3.5 x 20 mm Synergy DES utilizing distal embolic protection  Recommendations: Dual antiplatelet therapy with aspirin and clopidogrel 12 months as tolerated   Diagnostic Dominance: Right  Intervention    Echo 11/2018 1. The left ventricle has normal systolic function with an ejection  fraction of 60-65%. The cavity size was normal. Left ventricular diastolic  Doppler parameters are consistent with impaired relaxation. Elevated left  atrial and left ventricular  end-diastolic pressures The E/e' is >15. No evidence of left ventricular  regional wall motion abnormalities.  2. Left atrial size was moderately dilated.  3. The mitral valve is abnormal. Mild thickening of the mitral valve  leaflet. Mild calcification of the mitral valve leaflet. There is mild to  moderate mitral annular calcification present.  4. The tricuspid valve is grossly normal.  5. The aortic valve is abnormal. Moderate calcification of the aortic  valve. Aortic valve regurgitation is mild to moderate by color flow  Doppler. No stenosis of the aortic valve.    Laboratory Data:  High Sensitivity Troponin:   Recent Labs  Lab 06/23/19 0015 06/23/19 0246  TROPONINIHS 9 9     Chemistry Recent Labs  Lab 06/23/19 0015 06/23/19 0246  NA 138 137  K 4.4 4.3  CL 103 104  CO2 23 24  GLUCOSE 135* 133*  BUN 16 17  CREATININE 1.55* 1.56*  CALCIUM 9.1 8.7*  GFRNONAA 39* 39*  GFRAA 45* 45*  ANIONGAP 12 9    Hematology Recent Labs  Lab 06/23/19 0015  WBC 6.1  RBC 4.29  HGB 14.1  HCT 42.8  MCV 99.8  MCH 32.9  MCHC 32.9  RDW 13.0  PLT 101*   Radiology/Studies:  DG Chest  Portable 1 View  Result Date: 06/23/2019 CLINICAL DATA:  Chest pain over the last 2 days. EXAM: PORTABLE CHEST 1 VIEW COMPARISON:  03/25/2019 FINDINGS: Artifact overlies the chest. Previous median sternotomy and CABG. Heart size remains within normal limits. There is aortic atherosclerotic calcification. The pulmonary vascularity is normal. The lungs are clear. No effusions. No acute bone finding. IMPRESSION: No active disease.  Previous CABG.  Aortic atherosclerosis. Electronically Signed   By: Nelson Chimes M.D.   On: 06/23/2019 00:34   HEAR Score (for undifferentiated chest pain):  HEAR Score: 6    Assessment and Plan:   1. Chest pain -Woke up from a sleep last night with substernal chest pressure.  He required sublingual nitroglycerin x2 in the ER with complete resolution of his pain.  No recurrent pain.  Troponin negative.  EKG with nonspecific changes.  On dual antiplatelet therapy with aspirin and Plavix.  On Ranexa 500 mg twice daily. - Lexiscan Myoview to evaluate for ischemia  2.  CAD s/p CABG remotely -Most recently he had a cath 11/2018 and underwent DES to SVG RCA. Cath as above.   3.  Fatigue -Longstanding history.  Ongoing despite PCI 6 months ago.  His thyroid has been corrected now.  Chronically low platelets  4.  AKI with recent hematuria -Patient reported 2 episode of hematuria about 4 weeks ago.  Serum creatinine elevated at 1.5.  Reports  normal eating and drinking.  On fluids.  His urine looks dark brown.  May consider culture or microscopy per primary team.  5.  Elevated blood pressure -Previously on antihypertensive which was discontinued due to orthostasis.   For questions or updates, please contact [place] Please consult [email or web address] for contact info under   Jarrett Soho, PA  06/23/2019 11:25 AM   Patient seen and examined.  Agree with above documentation.  Patrick [name] is 87 year old male with a history of CAD with CABG in 1997 and DES to  [place] 11/2018, hypertension, thrombocytopenia, hypothyroidism is being seen for chest pain at the request of Dr. Cathlean Sauer.  Patient ports that he has had chest pain intermittently for 2 days prior to admission.  However last night he woke up with severe substernal chest pain.  Describes as elephant sitting on his chest.  He went to the ER and reports that chest pain resolved with nitroglycerin x2 and he has not had further chest pain.  Reports chest pain lasted over an hour.  High-sensitivity troponins 9->9.  EKG shows sinus rhythm, rate 67, Q waves in inferior leads (old), poor R wave progression.  His labs are notable for AKI (creatinine 1.6).  Given persistent chest pain for over an hour with negative troponins, suspect noncardiac chest pain.  However he does have a significant coronary history.  We will plan for noninvasive evaluation with Lexiscan Myoview.  Given his renal function, will likely manage medically unless severe ischemia.  Donato Heinz, MD

## 2019-06-23 NOTE — Progress Notes (Addendum)
PROGRESS NOTE    Patrick [name]  [id number] DOB: Dec 17, 1926 DOA: 06/22/2019 PCP: Marton Redwood, MD    Brief Narrative:  86 year old male who presented with left precordial chest pain.  He does have significant past medical history for coronary artery disease status post bypass grafting, and recent angioplasty 06/20.  Reported precordial chest pain, left-sided, pressure-like, no radiation, no associated symptoms, but persistent for the last 24 hours.  On his initial physical examination blood pressure 195/76, heart rate 66, respiratory 20, temperature 98.6, oxygen saturation 97%.  His lungs are clear to auscultation bilaterally, heart S1-S2, present rhythmic, soft abdomen, no lower extremity edema. Sodium 138, potassium 4.4, chloride 103, bicarb 23, glucose 135, BUN 16, creatinine 1.55, troponin 9-9, white count 6.1, hemoglobin 14.1, hematocrit 42.8, platelets 101. SARS COVD 19 negative.  Chest radiograph was negative for infiltrates.  EKG 67 bpm, left axis deviation, normal intervals, sinus rhythm, Q waves II, III, aVF, (chronic), no ST segment orT wave changes, poor R wave progression.  Patient was admitted to the hospital with the working diagnosis of atypical chest pain, rule out acute coronary syndrome.   Assessment & Plan:   Principal Problem:   Chest pain, rule out acute myocardial infarction Active Problems:   Coronary artery disease involving native coronary artery of native heart without angina pectoris   Hypothyroidism   Essential (primary) hypertension   Thrombocytopenia ([name])   AKI (acute kidney injury) ([place])   1. Atypical chest pain, in the setting of CAD sp CABG (1997) and angioplasty (2020). Atypical chest pain, troponin have been flat at 9 and ekg with chronic inferior wall q waves. Patient still concern about his chest pain. Apparently not active at home. Continue aggressive medical therapy with aspirin, clopidogrel, atorvastatin and ranolazine. Follow with cardiology  recommendations.   2. HTN/ blood pressure 122 to [id number] mmHg systolic. Patient has history of orthostatic hypotension, continue to hold antihypertensive medications for now.   3. Chronic thrombocytopenia. Continue close follow up of cell count.   4. AKI. Renal function with serum cr at 1,55 to 1,56, in 12/20 serum cr was 1,09, will continue close follow up of renal function and electrolytes, avoid nephrotoxic agents or hypotension. Will decrease IV fluids to 50 ml per NS for now.   5. Hypothyroid. Continue with levothyroxine.   DVT prophylaxis: enoxaparin   Code Status: full Family Communication: I spoke with patient's wife at the bedside, we talked in detail about patient's condition, plan of care and prognosis and all questions were addressed.  Disposition Plan/ discharge barriers: pending cardiac workup    Consultants:   Cardiology     Subjective: At home patient had persistent chest pressure with no improving or worsening factors, no radiation or associated nausea or vomiting. Improved with nitroglycerin in the ED.   Objective: Vitals:   06/23/19 0045 06/23/19 0145 06/23/19 0249 06/23/19 0752  BP: (!) 109/57 139/65 136/63 (!) 189/71  Pulse: 76 68 71 68  Resp: 19 15 17    Temp:   98.7 F (37.1 C) 98.4 F (36.9 C)  TempSrc:   Oral Oral  SpO2: 97% 96% 99% 98%  Weight:   72.8 kg   Height:   6' (1.829 m)     Intake/Output Summary (Last 24 hours) at 06/23/2019 1007 Last data filed at 06/23/2019 1000 Gross per 24 hour  Intake 765.44 ml  Output 180 ml  Net 585.44 ml   Filed Weights   06/23/19 0249  Weight: 72.8 kg    Examination:  General:  Deconditioned.  Neurology: Awake and alert, non focal  E ENT: no pallor, no icterus, oral mucosa moist Cardiovascular: No JVD. S1-S2 present, rhythmic, no gallops, rubs, or murmurs. No lower extremity edema. Pulmonary: positive breath sounds bilaterally, adequate air movement, no wheezing, rhonchi or rales. Gastrointestinal.  Abdomen with no organomegaly, non tender, no rebound or guarding Skin. No rashes Musculoskeletal: no joint deformities     Data Reviewed: I have personally reviewed following labs and imaging studies  CBC: Recent Labs  Lab 06/23/19 0015  WBC 6.1  HGB 14.1  HCT 42.8  MCV 99.8  PLT [id number]*   Basic Metabolic Panel: Recent Labs  Lab 06/23/19 0015 06/23/19 0246  NA 138 137  K 4.4 4.3  CL 103 104  CO2 23 24  GLUCOSE 135* 133*  BUN 16 17  CREATININE 1.55* 1.56*  CALCIUM 9.1 8.7*   GFR: Estimated Creatinine Clearance: 32.4 mL/min (A) (by C-G formula based on SCr of 1.56 mg/dL (H)). Liver Function Tests: No results for input(s): AST, ALT, ALKPHOS, BILITOT, PROT, ALBUMIN in the last 168 hours. No results for input(s): LIPASE, AMYLASE in the last 168 hours. No results for input(s): AMMONIA in the last 168 hours. Coagulation Profile: No results for input(s): INR, PROTIME in the last 168 hours. Cardiac Enzymes: No results for input(s): CKTOTAL, CKMB, CKMBINDEX, TROPONINI in the last 168 hours. BNP (last 3 results) No results for input(s): PROBNP in the last 8760 hours. HbA1C: No results for input(s): HGBA1C in the last 72 hours. CBG: Recent Labs  Lab 06/23/19 0732  GLUCAP 79   Lipid Profile: No results for input(s): CHOL, HDL, LDLCALC, TRIG, CHOLHDL, LDLDIRECT in the last 72 hours. Thyroid Function Tests: No results for input(s): TSH, T4TOTAL, FREET4, T3FREE, THYROIDAB in the last 72 hours. Anemia Panel: No results for input(s): VITAMINB12, FOLATE, FERRITIN, TIBC, IRON, RETICCTPCT in the last 72 hours.    Radiology Studies: I have reviewed all of the imaging during this hospital visit personally     Scheduled Meds: . allopurinol  300 mg Oral Daily  . aspirin  81 mg Oral Daily  . atorvastatin  20 mg Oral q1800  . clopidogrel  75 mg Oral Q breakfast  . donepezil  10 mg Oral QHS  . dorzolamide-timolol  1 drop Right Eye BID WC  . enoxaparin (LOVENOX) injection   40 mg Subcutaneous QHS  . levothyroxine  100 mcg Oral QAC breakfast  . pantoprazole  40 mg Oral QAC breakfast  . ranolazine  500 mg Oral BID  . sertraline  50 mg Oral Daily  . tamsulosin  0.4 mg Oral Daily   Continuous Infusions: . sodium chloride 100 mL/hr at 06/23/19 0329     LOS: 0 days        Kearia Yin Gerome Apley, MD

## 2019-06-23 NOTE — ED Notes (Signed)
RN to RN report given Davy Pique with no questions or concerns.

## 2019-06-23 NOTE — H&P (Signed)
History and Physical    Patrick Rica H Reiland [id number] DOB: 1926/06/29 DOA: 06/22/2019  PCP: Marton Redwood, MD  Patient coming from: Home  I have personally briefly reviewed patient's old medical records in [place]  Chief Complaint: CP  HPI: Patrick [name] is a 86 y.o. male with medical history significant of CAD s/p CABG, DES to SVG RCA bypass graft in July 2020.  Other hospital stays in 2020 include Diverticulitis in October and food impaction of esophagus in Nov.  Patient presents to the ED at [facility] with c/o CP.  Pain left of center in chest.  Squeezing, no radiation.  Onset yesterday.  No associated SOB, nausea, dizziness.  Never had CP with prior cardiac issues he says.   ED Course: single BP reading of 195/76 in ED (not clear if real or not).  Given 2 rounds of NTG for CP.  CP improved to 1/10.  BPs have all been 120 or less since then.  Trop neg x1.   Review of Systems: As per HPI, otherwise all review of systems negative.  Past Medical History:  Diagnosis Date  . BPH (benign prostatic hypertrophy)   . Diverticulosis   . ED (erectile dysfunction)   . GERD (gastroesophageal reflux disease)   . Gout   . Hepatitis    hx hepatitis after mono as teenager  . History of kidney stones   . History of skin cancer   . Hyperlipidemia   . Hypertension   . Hypothyroidism   . IHD (ischemic heart disease)    Remote CABG in 1997  . MI, acute, non ST segment elevation ([place]) 2010   s/p cath with occluded SVG to PD that fills by collaterals and the remainder of his revasculsarization is satisfactory. "the first time they did the stent , the second time they did the graft 8 vessels"  . Nocturia   . Thrombocytopenia ([place]) 11/19/2018  . Urinary frequency     Past Surgical History:  Procedure Laterality Date  . APPENDECTOMY    . BALLOON DILATION N/A 03/25/2019   Procedure: BALLOON DILATION;  Surgeon: Otis Brace, MD;  Location: [facility];  Service:  Gastroenterology;  Laterality: N/A;  . CARDIAC CATHETERIZATION  09/08/2008   NORMAL. EF 60%; Occluded SVG to PD that fills by collaterals and the remainder of his revascularization is satisfactory. he is managed medically.   . CORONARY ARTERY BYPASS GRAFT  1997   CABG x 8  . CORONARY STENT INTERVENTION N/A 11/18/2018   Procedure: CORONARY STENT INTERVENTION;  Surgeon: Sherren Mocha, MD;  Location: [facility];  Service: Cardiovascular;  Laterality: N/A;  . CYSTOSCOPY WITH INSERTION OF UROLIFT N/A 03/19/2015   Procedure: CYSTOSCOPY WITH INSERTION OF FOUR UROLIFTS;  Surgeon: Carolan Clines, MD;  Location: [facility];  Service: Urology;  Laterality: N/A;  . ESOPHAGOGASTRODUODENOSCOPY (EGD) WITH PROPOFOL N/A 02/12/2017   Procedure: ESOPHAGOGASTRODUODENOSCOPY (EGD) WITH PROPOFOL;  Surgeon: Otis Brace, MD;  Location: [facility];  Service: Gastroenterology;  Laterality: N/A;  . ESOPHAGOGASTRODUODENOSCOPY (EGD) WITH PROPOFOL N/A 07/13/2018   Procedure: ESOPHAGOGASTRODUODENOSCOPY (EGD) WITH PROPOFOL;  Surgeon: Clarene Essex, MD;  Location: [facility];  Service: Endoscopy;  Laterality: N/A;  . ESOPHAGOGASTRODUODENOSCOPY (EGD) WITH PROPOFOL N/A 03/25/2019   Procedure: ESOPHAGOGASTRODUODENOSCOPY (EGD) WITH PROPOFOL;  Surgeon: Otis Brace, MD;  Location: [facility];  Service: Gastroenterology;  Laterality: N/A;  . FOREIGN BODY REMOVAL N/A 07/13/2018   Procedure: FOREIGN BODY REMOVAL;  Surgeon: Clarene Essex, MD;  Location: [facility];  Service: Endoscopy;  Laterality:  N/A;  . LEFT HEART CATH AND CORS/GRAFTS ANGIOGRAPHY N/A 11/18/2018   Procedure: LEFT HEART CATH AND CORS/GRAFTS ANGIOGRAPHY;  Surgeon: Sherren Mocha, MD;  Location: [facility];  Service: Cardiovascular;  Laterality: N/A;     reports that he quit smoking about 61 years ago. His smoking use included cigarettes. He has a 15.00 pack-year smoking history. He has never used smokeless tobacco. He reports current alcohol use.  He reports that he does not use drugs.  Allergies  Allergen Reactions  . Halcion [Triazolam]     "Makes me crazy"  . Pravachol     Unknown    Family History  Problem Relation Age of Onset  . Heart failure Mother 29     Prior to Admission medications   Medication Sig Start Date End Date Taking? Authorizing Provider  allopurinol (ZYLOPRIM) 300 MG tablet Take 300 mg by mouth daily.    [provider]  aspirin 81 MG tablet Take 81 mg by mouth daily.      [provider]  atorvastatin (LIPITOR) 20 MG tablet Take 1 tablet (20 mg total) by mouth daily. 02/25/18 06/11/19  Daune Perch, NP  clopidogrel (PLAVIX) 75 MG tablet Take 1 tablet (75 mg total) by mouth daily with breakfast. 11/20/18   Barrett, Evelene Croon, PA-C  donepezil (ARICEPT) 10 MG tablet Take 10 mg by mouth at bedtime.  02/13/18   [provider]  dorzolamide-timolol (COSOPT) 22.3-6.8 MG/ML ophthalmic solution Place 1 drop into the right eye 2 (two) times daily. Does in morning and at dinner time. 02/11/19   [provider]  levothyroxine (SYNTHROID) 100 MCG tablet Take 1 tablet (100 mcg total) by mouth daily before breakfast. 02/28/19 06/11/19  Dahal, Marlowe Aschoff, MD  nitroGLYCERIN (NITROSTAT) 0.4 MG SL tablet PLACE 1 TABLET (0.4 MG TOTAL) UNDER THE TONGUE EVERY 5 (FIVE) MINUTES AS NEEDED. 06/24/18   Nahser, Wonda Cheng, MD  NONFORMULARY OR COMPOUNDED ITEM Elderly male with impaired mobility, Benefits from outpatient PT per physical therapy. 02/28/19   Terrilee Croak, MD  pantoprazole (PROTONIX) 40 MG tablet Take 1 tablet (40 mg total) by mouth daily before breakfast. 03/25/19 03/24/20  Brahmbhatt, Orson Gear, MD  ranolazine (RANEXA) 500 MG 12 hr tablet Take 500 mg by mouth 2 (two) times daily.    [provider]  sertraline (ZOLOFT) 50 MG tablet Take 50 mg by mouth daily. 05/28/19   [provider]  tamsulosin (FLOMAX) 0.4 MG CAPS capsule Take 0.4 mg by mouth daily.     [provider]    Vitamin D, Ergocalciferol, (DRISDOL) 50000 units CAPS capsule Take 50,000 Units by mouth once a week. 07/31/17   [provider]    Physical Exam: Vitals:   06/23/19 0001 06/23/19 0006 06/23/19 0045  BP: (!) 195/76 (!) 195/76 (!) 109/57  Pulse: 66 74 76  Resp: 20 18 19   Temp: 98.6 F (37 C) 98.6 F (37 C)   TempSrc: Oral Oral   SpO2: 100% 100% 97%    Constitutional: NAD, calm, comfortable Eyes: PERRL, lids and conjunctivae normal ENMT: Mucous membranes are moist. Posterior pharynx clear of any exudate or lesions.Normal dentition.  Neck: normal, supple, no masses, no thyromegaly Respiratory: clear to auscultation bilaterally, no wheezing, no crackles. Normal respiratory effort. No accessory muscle use.  Cardiovascular: Regular rate and rhythm, no murmurs / rubs / gallops. No extremity edema. 2+ pedal pulses. No carotid bruits.  Abdomen: no tenderness, no masses palpated. No hepatosplenomegaly. Bowel sounds positive.  Musculoskeletal: no clubbing /  cyanosis. No joint deformity upper and lower extremities. Good ROM, no contractures. Normal muscle tone.  Skin: no rashes, lesions, ulcers. No induration Neurologic: CN 2-12 grossly intact. Sensation intact, DTR normal. Strength 5/5 in all 4.  Psychiatric: Normal judgment and insight. Alert and oriented x 3. Normal mood.    Labs on Admission: I have personally reviewed following labs and imaging studies  CBC: Recent Labs  Lab 06/23/19 0015  WBC 6.1  HGB 14.1  HCT 42.8  MCV 99.8  PLT 99991111*   Basic Metabolic Panel: Recent Labs  Lab 06/23/19 0015  NA 138  K 4.4  CL 103  CO2 23  GLUCOSE 135*  BUN 16  CREATININE 1.55*  CALCIUM 9.1   GFR: Estimated Creatinine Clearance: 32.3 mL/min (A) (by C-G formula based on SCr of 1.55 mg/dL (H)). Liver Function Tests: No results for input(s): AST, ALT, ALKPHOS, BILITOT, PROT, ALBUMIN in the last 168 hours. No results for input(s): LIPASE, AMYLASE in the last 168 hours. No  results for input(s): AMMONIA in the last 168 hours. Coagulation Profile: No results for input(s): INR, PROTIME in the last 168 hours. Cardiac Enzymes: No results for input(s): CKTOTAL, CKMB, CKMBINDEX, TROPONINI in the last 168 hours. BNP (last 3 results) No results for input(s): PROBNP in the last 8760 hours. HbA1C: No results for input(s): HGBA1C in the last 72 hours. CBG: No results for input(s): GLUCAP in the last 168 hours. Lipid Profile: No results for input(s): CHOL, HDL, LDLCALC, TRIG, CHOLHDL, LDLDIRECT in the last 72 hours. Thyroid Function Tests: No results for input(s): TSH, T4TOTAL, FREET4, T3FREE, THYROIDAB in the last 72 hours. Anemia Panel: No results for input(s): VITAMINB12, FOLATE, FERRITIN, TIBC, IRON, RETICCTPCT in the last 72 hours. Urine analysis:    Component Value Date/Time   COLORURINE YELLOW 02/26/2019 1630   APPEARANCEUR CLEAR 02/26/2019 1630   LABSPEC 1.020 02/26/2019 1630   PHURINE 6.5 02/26/2019 1630   GLUCOSEU NEGATIVE 02/26/2019 1630   HGBUR NEGATIVE 02/26/2019 1630   BILIRUBINUR NEGATIVE 02/26/2019 1630   KETONESUR NEGATIVE 02/26/2019 1630   PROTEINUR NEGATIVE 02/26/2019 1630   UROBILINOGEN 1.0 03/20/2015 1813   NITRITE NEGATIVE 02/26/2019 1630   LEUKOCYTESUR NEGATIVE 02/26/2019 1630    Radiological Exams on Admission: DG Chest Portable 1 View  Result Date: 06/23/2019 CLINICAL DATA:  Chest pain over the last 2 days. EXAM: PORTABLE CHEST 1 VIEW COMPARISON:  03/25/2019 FINDINGS: Artifact overlies the chest. Previous median sternotomy and CABG. Heart size remains within normal limits. There is aortic atherosclerotic calcification. The pulmonary vascularity is normal. The lungs are clear. No effusions. No acute bone finding. IMPRESSION: No active disease.  Previous CABG.  Aortic atherosclerosis. Electronically Signed   By: Nelson Chimes M.D.   On: 06/23/2019 00:34    EKG: Independently reviewed.  Assessment/Plan Principal Problem:   Chest pain,  rule out acute myocardial infarction Active Problems:   Coronary artery disease involving native coronary artery of native heart without angina pectoris   Hypothyroidism   Essential (primary) hypertension   Thrombocytopenia ([name])   AKI (acute kidney injury) ([place])    1. CP r/o - 1. CP obs pathway 2. Serial trops 3. Tele monitor 4. NPO 5. Call cards in AM 2. Mild AKI - 1. Creat 1.5 today up from 1.0 in Dec 2. 500 cc NS bolus in ED, and NS at 100 cc/hr for now 3. Intake and output 4. Repeat BMP in AM 3. H/o CAD - 1. Cont asa / plavix 2. Cont statin 4.  HTN - 1. Per cardiology office note (1/27), was apparently taken off of all BP meds other than flomax for BPH last year due to orthostatic hypotension. 2. BPs looking good in ED at the moment, all in the 99991111 systolic range, except for the single BP recorded as 195/76, EDP isnt sure that one was accurate however. 3. Continue to monitor BP 5. Thrombocytopenia - mild, chronic  DVT prophylaxis: Lovenox Code Status: Full Family Communication: No family in room Disposition Plan: Home after admit Consults called: message sent to [facility]. [facility] for routine cards eval in AM Admission status: Place in [place]    Jeffie Spivack, [facility]  How to contact the [facility] Attending or Consulting provider [place] or covering provider during after hours [place], for this patient?  1. Check the care team in [facility] and look for a) attending/consulting [facility] provider listed and b) the [facility] team listed 2. Log into [email or web address]  Amion Physician Scheduling and messaging for groups and whole hospitals  On call and physician scheduling software for group practices, residents, hospitalists and other medical providers for call, clinic, rotation and shift schedules. On[employer] is a hospital-wide system for scheduling doctors and paging doctors on call. EasyPlot is for scientific plotting and data analysis.  [email or web address]  and use [place]'s  universal password to access. If you do not have the password, please contact the hospital operator.  3. Locate the [facility] provider you are looking for under [facility] and page to a number that you can be directly reached. 4. If you still have difficulty reaching the provider, please page the [facility] (Director on Call) for the Hospitalists listed on amion for assistance.  06/23/2019, 1:53 AM

## 2019-06-24 ENCOUNTER — Observation Stay (HOSPITAL_BASED_OUTPATIENT_CLINIC_OR_DEPARTMENT_OTHER): Payer: Medicare Other

## 2019-06-24 DIAGNOSIS — R079 Chest pain, unspecified: Secondary | ICD-10-CM | POA: Diagnosis not present

## 2019-06-24 DIAGNOSIS — E039 Hypothyroidism, unspecified: Secondary | ICD-10-CM | POA: Diagnosis not present

## 2019-06-24 DIAGNOSIS — I10 Essential (primary) hypertension: Secondary | ICD-10-CM

## 2019-06-24 DIAGNOSIS — I2510 Atherosclerotic heart disease of native coronary artery without angina pectoris: Secondary | ICD-10-CM | POA: Diagnosis not present

## 2019-06-24 DIAGNOSIS — N179 Acute kidney failure, unspecified: Secondary | ICD-10-CM | POA: Diagnosis not present

## 2019-06-24 DIAGNOSIS — R0789 Other chest pain: Secondary | ICD-10-CM | POA: Diagnosis not present

## 2019-06-24 DIAGNOSIS — D696 Thrombocytopenia, unspecified: Secondary | ICD-10-CM | POA: Diagnosis not present

## 2019-06-24 LAB — ECHOCARDIOGRAM COMPLETE
Height: 72 in
Weight: 2568 oz

## 2019-06-24 LAB — COMPREHENSIVE METABOLIC PANEL
ALT: 26 U/L (ref 0–44)
AST: 17 U/L (ref 15–41)
Albumin: 3.3 g/dL — ABNORMAL LOW (ref 3.5–5.0)
Alkaline Phosphatase: 58 U/L (ref 38–126)
Anion gap: 9 (ref 5–15)
BUN: 12 mg/dL (ref 8–23)
CO2: 24 mmol/L (ref 22–32)
Calcium: 8.2 mg/dL — ABNORMAL LOW (ref 8.9–10.3)
Chloride: 103 mmol/L (ref 98–111)
Creatinine, Ser: 1.19 mg/dL (ref 0.61–1.24)
GFR calc Af Amer: >60 mL/min (ref >60)
GFR calc non Af Amer: 53 mL/min — ABNORMAL LOW (ref >60)
Glucose, Bld: 106 mg/dL — ABNORMAL HIGH (ref 70–99)
Potassium: 3.8 mmol/L (ref 3.5–5.1)
Sodium: 136 mmol/L (ref 135–145)
Total Bilirubin: 1.1 mg/dL (ref 0.3–1.2)
Total Protein: 5.6 g/dL — ABNORMAL LOW (ref 6.5–8.1)

## 2019-06-24 LAB — URINALYSIS, COMPLETE (UACMP) WITH MICROSCOPIC
Bacteria, UA: NONE SEEN
Bilirubin Urine: NEGATIVE
Glucose, UA: NEGATIVE mg/dL
Ketones, ur: NEGATIVE mg/dL
Leukocytes,Ua: NEGATIVE
Nitrite: NEGATIVE
Protein, ur: NEGATIVE mg/dL
RBC / HPF: >50 RBC/hpf — ABNORMAL HIGH (ref 0–5)
Specific Gravity, Urine: 1.014 (ref 1.005–1.030)
pH: 6 (ref 5.0–8.0)

## 2019-06-24 LAB — NM MYOCAR MULTI W/SPECT W/WALL MOTION / EF
Estimated workload: 1 METS
Exercise duration (min): 5 min
Exercise duration (sec): 0 s
MPHR: 130 {beats}/min
Peak HR: 80 {beats}/min
Percent HR: 61 %
Rest HR: 59 {beats}/min

## 2019-06-24 MED ORDER — TECHNETIUM TC 99M TETROFOSMIN IV KIT
10.0000 | PACK | Freq: Once | INTRAVENOUS | Status: AC | PRN
  Administered 2019-06-24: 10 via INTRAVENOUS

## 2019-06-24 MED ORDER — REGADENOSON 0.4 MG/5ML IV SOLN
0.4000 mg | Freq: Once | INTRAVENOUS | Status: AC
  Administered 2019-06-24: 0.4 mg via INTRAVENOUS
  Filled 2019-06-24: qty 5

## 2019-06-24 MED ORDER — REGADENOSON 0.4 MG/5ML IV SOLN
INTRAVENOUS | Status: AC
  Filled 2019-06-24: qty 5

## 2019-06-24 NOTE — Progress Notes (Signed)
   Patrick [name] presented for a nuclear stress test today.  No immediate complications.  Stress imaging is pending at this time.  Preliminary EKG findings may be listed in the chart, but the stress test result will not be finalized until perfusion imaging is complete.  1 day study, [place] to read.  Rosaria Ferries, PA-C 06/24/2019, 10:19 AM

## 2019-06-24 NOTE — Discharge Summary (Addendum)
Physician Discharge Summary  Patrick [name] [id number] DOB: [birth year] DOA: 06/22/2019  PCP: Marton Redwood, MD  Admit date: 06/22/2019 Discharge date: 06/24/2019  Admitted From: Home  Disposition:  Home   Recommendations for Outpatient Follow-up and new medication changes:  1. Follow up with Dr. Brigitte Pulse in 7 days. 2. Follow up renal panel in 7 days. 3. Follow up with cardiology as scheduled.   Home Health: no   Equipment/Devices:  No    Discharge Condition: stable  CODE STATUS: full  Diet recommendation: heart healthy   Brief/Interim Summary:  Patient was admitted to the hospital with the working diagnosis of atypical chest pain, rule out acute coronary syndrome  86 year old male who presented with left precordial chest pain.  He does have significant past medical history for coronary artery disease status post bypass grafting, and recent angioplasty 06/20.  Reported precordial chest pain, left-sided, pressure-like, no radiation, no associated symptoms, but persistent for the last 24 hours prior to hospitalization.  On his initial physical examination blood pressure 195/76, heart rate 66, respiratory rate 20, temperature 98.6, oxygen saturation 97%.  His lungs were clear to auscultation bilaterally, heart S1-S2, present rhythmic, soft abdomen, no lower extremity edema. Sodium 138, potassium 4.4, chloride 103, bicarb 23, glucose 135, BUN 16, creatinine 1.55, troponin 9-9, white count 6.1, hemoglobin 14.1, hematocrit 42.8, platelets 101. SARS COVD 19 negative.  Chest radiograph was negative for infiltrates.  EKG 67 bpm, left axis deviation, normal intervals, sinus rhythm, Q waves II, III, aVF, (chronic), no ST segment orT wave changes, poor R wave progression.  Patient with improvement of chest pain, ruled out for acute coronary syndrome. Underwent further work up with nuclear stress test per cardiology recommendations.   1.  Atypical chest pain in the setting of coronary disease status  post bypass grafting (1997) and angioplasty (2020).  Patient was admitted to the telemetry ward, he had serial cardiac enzymes and EKGs, his chest pain improved, he ruled out for acute coronary syndrome.  He had further work-up with transthoracic echocardiography and nuclear stress test (no reversible ischemia or infarction, normal LV wall motion, noninvasive risk stratification was low.   Continue medical therapy with aspirin, clopidogrel, atorvastatin and ranolazine.   2.  Hypertension.  His blood pressure remained stable, he had history of orthostatic hypotension, currently off antihypertensive agents.  3.  Prerenal acute kidney injury.  Patient's received isotonic saline intravenously with improvement of kidney function.  Discharge creatinine 1.19, potassium 3.8 and serum bicarb 24.  Close follow-up of kidney function as an outpatient.  4.  Chronic thrombocytopenia.  Cell count remained stable.  5.  Hypothyroidism.  Continue levothyroxine.  I spoke over the phone with the patient's wife about patient's  condition, plan of care, prognosis and all questions were addressed.  Discharge Diagnoses:  Principal Problem:   Chest pain, rule out acute myocardial infarction Active Problems:   Coronary artery disease involving native coronary artery of native heart without angina pectoris   Hypothyroidism   Essential (primary) hypertension   Thrombocytopenia ([name])   AKI (acute kidney injury) ([place])    Discharge Instructions   Allergies as of 06/24/2019      Reactions   Halcion [triazolam]    "Makes me crazy"   Pravachol    Unknown      Medication List    TAKE these medications   allopurinol 300 MG tablet Commonly known as: ZYLOPRIM Take 300 mg by mouth daily.   aspirin 81 MG tablet Take 81 mg by  mouth daily.   atorvastatin 20 MG tablet Commonly known as: LIPITOR Take 1 tablet (20 mg total) by mouth daily.   clopidogrel 75 MG tablet Commonly known as: PLAVIX Take 1 tablet (75  mg total) by mouth daily with breakfast.   donepezil 10 MG tablet Commonly known as: ARICEPT Take 10 mg by mouth at bedtime.   levothyroxine 100 MCG tablet Commonly known as: SYNTHROID Take 1 tablet (100 mcg total) by mouth daily before breakfast.   nitroGLYCERIN 0.4 MG SL tablet Commonly known as: NITROSTAT PLACE 1 TABLET (0.4 MG TOTAL) UNDER THE TONGUE EVERY 5 (FIVE) MINUTES AS NEEDED.   NONFORMULARY OR COMPOUNDED ITEM Elderly male with impaired mobility, Benefits from outpatient PT per physical therapy.   pantoprazole 40 MG tablet Commonly known as: Protonix Take 1 tablet (40 mg total) by mouth daily before breakfast.   ranolazine 500 MG 12 hr tablet Commonly known as: RANEXA Take 500 mg by mouth 2 (two) times daily.   sertraline 50 MG tablet Commonly known as: ZOLOFT Take 50 mg by mouth daily.   tamsulosin 0.4 MG Caps capsule Commonly known as: FLOMAX Take 0.4 mg by mouth daily.   Vitamin D (Ergocalciferol) 1.25 MG (50000 UNIT) Caps capsule Commonly known as: DRISDOL Take 50,000 Units by mouth once a week.       Allergies  Allergen Reactions  . Halcion [Triazolam]     "Makes me crazy"  . Pravachol     Unknown    Consultations:  Cardiology    Procedures/Studies: DG Chest Portable 1 View  Result Date: 06/23/2019 CLINICAL DATA:  Chest pain over the last 2 days. EXAM: PORTABLE CHEST 1 VIEW COMPARISON:  03/25/2019 FINDINGS: Artifact overlies the chest. Previous median sternotomy and CABG. Heart size remains within normal limits. There is aortic atherosclerotic calcification. The pulmonary vascularity is normal. The lungs are clear. No effusions. No acute bone finding. IMPRESSION: No active disease.  Previous CABG.  Aortic atherosclerosis. Electronically Signed   By: Nelson Chimes M.D.   On: 06/23/2019 00:34   VAS [place] ABI WITH/WO TBI  Result Date: 06/13/2019 LOWER EXTREMITY DOPPLER STUDY Indications: For about 3-5 years patient has complained of bilateral foot               numbness from ankles down to toes. He states while walking he trips              because he can't feel his feet. Patient denies claudication or rest              pain symptoms. High Risk Factors: Hypertension, hyperlipidemia, past history of smoking,                    coronary artery disease.  Comparison Study: Prior ABI done 05/16/2016 Right 1.08 Left 1.13 Performing Technologist: Alecia Mackin RVT, RDCS (AE), RDMS  Examination Guidelines: A complete evaluation includes at minimum, Doppler waveform signals and systolic blood pressure reading at the level of bilateral brachial, anterior tibial, and posterior tibial arteries, when vessel segments are accessible. Bilateral testing is considered an integral part of a complete examination. Photoelectric Plethysmograph (PPG) waveforms and toe systolic pressure readings are included as required and additional duplex testing as needed. Limited examinations for reoccurring indications may be performed as noted.  ABI Findings: +---------+------------------+-----+---------+--------+ Right    Rt Pressure (mmHg)IndexWaveform Comment  +---------+------------------+-----+---------+--------+ Brachial 162                                      +---------+------------------+-----+---------+--------+  ATA      165               1.02 triphasic         +---------+------------------+-----+---------+--------+ PTA      163               1.01 triphasic         +---------+------------------+-----+---------+--------+ PERO     168               1.04 triphasic         +---------+------------------+-----+---------+--------+ Great Toe67                0.41 Abnormal          +---------+------------------+-----+---------+--------+ +---------+------------------+-----+-----------+-------+ Left     Lt Pressure (mmHg)IndexWaveform   Comment +---------+------------------+-----+-----------+-------+ ATA      154               0.95 multiphasic         +---------+------------------+-----+-----------+-------+ PTA      163               1.01 triphasic          +---------+------------------+-----+-----------+-------+ PERO     151               0.93 triphasic          +---------+------------------+-----+-----------+-------+ Great Toe64                0.40 Abnormal           +---------+------------------+-----+-----------+-------+ +-------+-----------+-----------+------------+------------+ ABI/TBIToday's ABIToday's TBIPrevious ABIPrevious TBI +-------+-----------+-----------+------------+------------+ Right  1.01       .41        1.08        .50          +-------+-----------+-----------+------------+------------+ Left   1.00       .39        1.13        .50          +-------+-----------+-----------+------------+------------+ Bilateral ABIs and TBIs appear essentially unchanged.  Summary: Right: Resting right ankle-brachial index is within normal range. No evidence of significant right lower extremity arterial disease. The right toe-brachial index is abnormal. Left: Resting left ankle-brachial index is within normal range. No evidence of significant left lower extremity arterial disease. The left toe-brachial index is abnormal.  *See table(s) above for measurements and observations.  Electronically signed by Quay Burow MD on 06/13/2019 at 5:09:36 PM.    Final        Subjective: Patient is feeling better, chest pain has improved, no nausea or vomiting and tolerating po well.   Discharge Exam: Vitals:   06/24/19 1010 06/24/19 1012  BP: (!) 138/59 138/75  Pulse: 79 76  Resp:    Temp:    SpO2:     Vitals:   06/24/19 0945 06/24/19 1008 06/24/19 1010 06/24/19 1012  BP: (!) 156/71 (!) 136/54 (!) 138/59 138/75  Pulse: 61 62 79 76  Resp:      Temp:      TempSrc:      SpO2:      Weight:      Height:        General: Not in pain or dyspnea.  Neurology: Awake and alert, non focal  E ENT: no pallor, no icterus,  oral mucosa moist Cardiovascular: No JVD. S1-S2 present, rhythmic, no gallops, rubs, or murmurs. No lower extremity edema. Pulmonary: positive breath sounds bilaterally, adequate air movement, no wheezing, rhonchi or rales.  Gastrointestinal. Abdomen with no organomegaly, non tender, no rebound or guarding Skin. No rashes Musculoskeletal: no joint deformities   The results of significant diagnostics from this hospitalization (including imaging, microbiology, ancillary and laboratory) are listed below for reference.     Microbiology: Recent Results (from the past 240 hour(s))  SARS CORONAVIRUS 2 (TAT 6-24 HRS) Nasopharyngeal Nasopharyngeal Swab     Status: None   Collection Time: 06/23/19 12:45 AM   Specimen: Nasopharyngeal Swab  Result Value Ref Range Status   SARS Coronavirus 2 NEGATIVE NEGATIVE Final    Comment: (NOTE) SARS-CoV-2 target nucleic acids are NOT DETECTED. The SARS-CoV-2 RNA is generally detectable in upper and lower respiratory specimens during the acute phase of infection. Negative results do not preclude SARS-CoV-2 infection, do not rule out co-infections with other pathogens, and should not be used as the sole basis for treatment or other patient management decisions. Negative results must be combined with clinical observations, patient history, and epidemiological information. The expected result is Negative. Fact Sheet for Patients: SugarRoll.be Fact Sheet for Healthcare Providers: https://www.woods-mathews.com/ This test is not yet approved or cleared by the [place] FDA and  has been authorized for detection and/or diagnosis of SARS-CoV-2 by FDA under an Emergency Use Authorization (EUA). This EUA will remain  in effect (meaning this test can be used) for the duration of the COVID-19 declaration under Section 56 4(b)(1) of the Act, 21 U.S.C. section 360bbb-3(b)(1), unless the authorization is terminated or revoked  sooner. Performed at [facility], [place] [address], [place], [place] [address]      Labs: BNP (last 3 results) Recent Labs    11/14/18 1717  BNP [id number]   Basic Metabolic Panel: Recent Labs  Lab 06/23/19 0015 06/23/19 0246 06/24/19 0327  NA 138 137 136  K 4.4 4.3 3.8  CL 103 104 103  CO2 23 24 24   GLUCOSE 135* 133* 106*  BUN 16 17 12   CREATININE 1.55* 1.56* 1.19  CALCIUM 9.1 8.7* 8.2*   Liver Function Tests: Recent Labs  Lab 06/24/19 0327  AST 17  ALT 26  ALKPHOS 58  BILITOT 1.1  PROT 5.6*  ALBUMIN 3.3*   No results for input(s): LIPASE, AMYLASE in the last 168 hours. No results for input(s): AMMONIA in the last 168 hours. CBC: Recent Labs  Lab 06/23/19 0015  WBC 6.1  HGB 14.1  HCT 42.8  MCV 99.8  PLT 101*   Cardiac Enzymes: No results for input(s): CKTOTAL, CKMB, CKMBINDEX, TROPONINI in the last 168 hours. BNP: Invalid input(s): POCBNP CBG: Recent Labs  Lab 06/23/19 0732  GLUCAP 79   D-Dimer No results for input(s): DDIMER in the last 72 hours. Hgb A1c No results for input(s): HGBA1C in the last 72 hours. Lipid Profile No results for input(s): CHOL, HDL, LDLCALC, TRIG, CHOLHDL, LDLDIRECT in the last 72 hours. Thyroid function studies No results for input(s): TSH, T4TOTAL, T3FREE, THYROIDAB in the last 72 hours.  Invalid input(s): FREET3 Anemia work up No results for input(s): VITAMINB12, FOLATE, FERRITIN, TIBC, IRON, RETICCTPCT in the last 72 hours. Urinalysis    Component Value Date/Time   COLORURINE YELLOW 06/24/2019 0101   APPEARANCEUR CLEAR 06/24/2019 0101   LABSPEC 1.014 06/24/2019 0101   PHURINE 6.0 06/24/2019 0101   GLUCOSEU NEGATIVE 06/24/2019 0101   HGBUR MODERATE (A) 06/24/2019 0101   BILIRUBINUR NEGATIVE 06/24/2019 0101   KETONESUR NEGATIVE 06/24/2019 0101   PROTEINUR NEGATIVE 06/24/2019 0101   UROBILINOGEN 1.0 03/20/2015 1813   NITRITE NEGATIVE  06/24/2019 0101   LEUKOCYTESUR NEGATIVE 06/24/2019 0101   Sepsis  Labs Invalid input(s): PROCALCITONIN,  WBC,  LACTICIDVEN Microbiology Recent Results (from the past 240 hour(s))  SARS CORONAVIRUS 2 (TAT 6-24 HRS) Nasopharyngeal Nasopharyngeal Swab     Status: None   Collection Time: 06/23/19 12:45 AM   Specimen: Nasopharyngeal Swab  Result Value Ref Range Status   SARS Coronavirus 2 NEGATIVE NEGATIVE Final    Comment: (NOTE) SARS-CoV-2 target nucleic acids are NOT DETECTED. The SARS-CoV-2 RNA is generally detectable in upper and lower respiratory specimens during the acute phase of infection. Negative results do not preclude SARS-CoV-2 infection, do not rule out co-infections with other pathogens, and should not be used as the sole basis for treatment or other patient management decisions. Negative results must be combined with clinical observations, patient history, and epidemiological information. The expected result is Negative. Fact Sheet for Patients: SugarRoll.be Fact Sheet for Healthcare Providers: https://www.woods-mathews.com/ This test is not yet approved or cleared by the [place] FDA and  has been authorized for detection and/or diagnosis of SARS-CoV-2 by FDA under an Emergency Use Authorization (EUA). This EUA will remain  in effect (meaning this test can be used) for the duration of the COVID-19 declaration under Section 56 4(b)(1) of the Act, 21 U.S.C. section 360bbb-3(b)(1), unless the authorization is terminated or revoked sooner. Performed at [facility], [place] [address] [place], [place] [address]      Time coordinating discharge: 45 minutes  SIGNED:   Tawni Millers, MD  [facility] 06/24/2019, 11:05 AM

## 2019-06-24 NOTE — Progress Notes (Addendum)
Progress Note  Patient Name: Patrick [name] Date of Encounter: 06/24/2019  Primary Cardiologist: Mertie Moores, MD   Subjective   Patient denies recurrent chest pain. Plan for myoview today.  Inpatient Medications    Scheduled Meds: . allopurinol  300 mg Oral Daily  . aspirin  81 mg Oral Daily  . atorvastatin  20 mg Oral q1800  . clopidogrel  75 mg Oral Q breakfast  . donepezil  10 mg Oral QHS  . dorzolamide-timolol  1 drop Right Eye BID WC  . enoxaparin (LOVENOX) injection  40 mg Subcutaneous QHS  . levothyroxine  100 mcg Oral QAC breakfast  . pantoprazole  40 mg Oral QAC breakfast  . ranolazine  500 mg Oral BID  . sertraline  50 mg Oral Daily  . tamsulosin  0.4 mg Oral Daily   Continuous Infusions: . sodium chloride 50 mL/hr at 06/24/19 0411   PRN Meds: acetaminophen, nitroGLYCERIN, ondansetron (ZOFRAN) IV   Vital Signs    Vitals:   06/23/19 1611 06/23/19 2216 06/23/19 2300 06/24/19 0706  BP: (!) 122/59 125/60 (!) 142/75 (!) 154/76  Pulse: 82 76  67  Resp:  17  18  Temp: 99.1 F (37.3 C) 99.6 F (37.6 C)  98.6 F (37 C)  TempSrc: Oral Oral  Oral  SpO2: 95% 95%  96%  Weight:      Height:        Intake/Output Summary (Last 24 hours) at 06/24/2019 0800 Last data filed at 06/24/2019 0616 Gross per 24 hour  Intake 930 ml  Output 1675 ml  Net -745 ml   Last 3 Weights 06/23/2019 06/11/2019 04/29/2019  Weight (lbs) 160 lb 8 oz 159 lb 159 lb  Weight (kg) 72.802 kg 72.122 kg 72.122 kg      Telemetry    NSR, HR 70-80s; transient first degree HB - Personally Reviewed  ECG    No new - Personally Reviewed  Physical Exam   GEN: No acute distress.   Neck: No JVD Cardiac: RRR, no murmurs, rubs, or gallops.  Respiratory: Clear to auscultation bilaterally. GI: Soft, nontender, non-distended  MS: No edema; No deformity. Neuro:  Nonfocal  Psych: Normal affect   Labs    High Sensitivity Troponin:   Recent Labs  Lab 06/23/19 0015 06/23/19 0246    TROPONINIHS 9 9      Chemistry Recent Labs  Lab 06/23/19 0015 06/23/19 0246 06/24/19 0327  NA 138 137 136  K 4.4 4.3 3.8  CL 103 104 103  CO2 23 24 24   GLUCOSE 135* 133* 106*  BUN 16 17 12   CREATININE 1.55* 1.56* 1.19  CALCIUM 9.1 8.7* 8.2*  PROT  --   --  5.6*  ALBUMIN  --   --  3.3*  AST  --   --  17  ALT  --   --  26  ALKPHOS  --   --  58  BILITOT  --   --  1.1  GFRNONAA 39* 39* 53*  GFRAA 45* 45* >60  ANIONGAP 12 9 9      Hematology Recent Labs  Lab 06/23/19 0015  WBC 6.1  RBC 4.29  HGB 14.1  HCT 42.8  MCV 99.8  MCH 32.9  MCHC 32.9  RDW 13.0  PLT 101*    BNPNo results for input(s): BNP, PROBNP in the last 168 hours.   DDimer No results for input(s): DDIMER in the last 168 hours.   Radiology    DG Chest Portable  1 View  Result Date: 06/23/2019 CLINICAL DATA:  Chest pain over the last 2 days. EXAM: PORTABLE CHEST 1 VIEW COMPARISON:  03/25/2019 FINDINGS: Artifact overlies the chest. Previous median sternotomy and CABG. Heart size remains within normal limits. There is aortic atherosclerotic calcification. The pulmonary vascularity is normal. The lungs are clear. No effusions. No acute bone finding. IMPRESSION: No active disease.  Previous CABG.  Aortic atherosclerosis. Electronically Signed   By: Nelson Chimes M.D.   On: 06/23/2019 00:34    Cardiac Studies   Lexiscan stress test ordered  Patient Profile     86 y.o. male with hx of CAD s/p CABG 1997, most recent DES to SVG-distal RCA in 11/2018, hypothyroidism, thrombocytopenia and GERD who is being evaluated for chest pain.   Assessment & Plan    Chest pain - Pain woke patient up form sleep, NTG responsive - HS troponin and EKG unremarkable - Patient has been chest pain free since admission - Plan for myoview today - continue Aspirin and Plaivx - on Ranexa 500 mg BID  CAD s/p remote CABG - had recent cath 11/2018 and underwent DES to SVG RCA  AKI with recent hematuria - 1.56 > 1.19 - Improved  with IVF  HTN - was previously on antihypertensives but were discontinued due to orthostasis - BP today 154/76. Given h/o of severe orthostasis previously requiring florinef will hold off on antihypertensives for now   For questions or updates, please contact [place] Please consult [email or web address] for contact info under      Signed, Cadence Ninfa Meeker, PA-C  06/24/2019, 8:00 AM    Patient seen and examined.  Agree with above documentation.  On exam, patient is alert and oriented, regular rate and rhythm, no murmurs, lungs CTAB, no LE edema or JVD.  Labs notable for resolution of AKI with IV fluids.  Will follow up [place] and TTE today.  Donato Heinz, MD

## 2019-06-24 NOTE — Care Management Obs Status (Signed)
[facility] NOTIFICATION   Patient Details  Name: [place] H Haubner MRN: [id number] Date of Birth: 10-18-1926   Medicare Observation Status Notification Given:  Yes    Dawayne Patricia, RN 06/24/2019, 1:06 PM

## 2019-06-24 NOTE — Progress Notes (Signed)
  Echocardiogram 2D Echocardiogram has been performed.  Patrick [name] Patrick [name] 06/24/2019, 3:09 PM

## 2019-06-26 NOTE — Progress Notes (Deleted)
CARDIOLOGY OFFICE NOTE  Date:  06/26/2019    Patrick Rica H Asa Date of Birth: 1927-05-10 Medical Record [id number]  PCP:  Marton Redwood, MD  Cardiologist:  Servando Snare & ***    No chief complaint on file.   History of Present Illness: Patrick [name] is a 86 y.o. male who presents today for a *** Seen for Dr. Acie Fredrickson. He is a former patient of Dr. Susa Simmonds. He is primarily seeing me now.   He has a known history of CAD with remote CABG from 47 (Dr. Redmond Pulling), HTN, HLD, hypothyroidism and GERD.   His wife reached out to me just before the July 4th holiday weekend - he was having marked SOB and unstable angina -had not been doing well for several weeks -he was referred for admission.His cardiac enzymes were negative for MI and his ECG was nonacute. An echocardiogram showed preserved left ventricular function with no wall motion abnormalities, butthere iscalciumon both mitral and aortic valves.   He underwent cardiac cath with Dr. Burt Knack on7/10/2018 withDES to the SVG-distal RCA. He was noted to have thrombocytopenia -withno bleeding issues. BP was elevated and his ARB was increased. He has resting bradycardia and is on beta blocker therapy- this was not changed.  His TSH was markedly low -he has had his dose of Synthroid reduced, then stopped and now back onincreasingdose.   I have seen him as a work in a few times - he has developed marked orthostasis - not clear to me as to why -ended upoff most of his medicines - was placed on Florinef with some improvement. He saw Dr. Acie Fredrickson just a few weeks ago - cath films again reviewed - lots of native disease - trying to manage medically - he hadbeen placed on Ranexa.Then developed abdominal abscess from diverticulitis and was back in the hospital. 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   He has continued to feel bad, very tired - very angry about his overall situation. Last seen  in December - thyroid level had finally leveled out.   Wife reached out to me last week - he is sleeping more - extremities cold and blue. He wanted to be seen.   The patient does not have symptoms concerning for COVID-19 infection (fever, chills, cough, or new shortness of breath).   Comes in today. Here with his wife [name] - she augments the history. He remains tired. Having more issues with his legs - both are numb - turn blue at times. He notes having more issues with balance and walking. He says that he sometimes has to focus on his brain to tell his legs to move. No chest pain. Sleeping 13 to 16 hours a day - still using Tylenol PM at night and now on Zoloft that he takes in the AM. Not crying - this is better. Still not really doing much. Has had recent fall with getting out of the car. She is worried about his extremities turning blue. His ABIs were all normal back in 2018.  The patient {does/does not:200015} have symptoms concerning for COVID-19 infection (fever, chills, cough, or new shortness of breath).   Comes in today. Here with   Past Medical History:  Diagnosis Date  . BPH (benign prostatic hypertrophy)   . Diverticulosis   . ED (erectile dysfunction)   . GERD (gastroesophageal reflux disease)   . Gout   . Hepatitis    hx hepatitis  after mono as teenager  . History of kidney stones   . History of skin cancer   . Hyperlipidemia   . Hypertension   . Hypothyroidism   . IHD (ischemic heart disease)    Remote CABG in 1997  . MI, acute, non ST segment elevation ([place]) 2010   s/p cath with occluded SVG to PD that fills by collaterals and the remainder of his revasculsarization is satisfactory. "the first time they did the stent , the second time they did the graft 8 vessels"  . Nocturia   . Thrombocytopenia ([place]) 11/19/2018  . Urinary frequency     Past Surgical History:  Procedure Laterality Date  . APPENDECTOMY    . BALLOON DILATION N/A 03/25/2019   Procedure:  BALLOON DILATION;  Surgeon: Otis Brace, MD;  Location: [facility];  Service: Gastroenterology;  Laterality: N/A;  . CARDIAC CATHETERIZATION  09/08/2008   NORMAL. EF 60%; Occluded SVG to PD that fills by collaterals and the remainder of his revascularization is satisfactory. he is managed medically.   . CORONARY ARTERY BYPASS GRAFT  1997   CABG x 8  . CORONARY STENT INTERVENTION N/A 11/18/2018   Procedure: CORONARY STENT INTERVENTION;  Surgeon: Sherren Mocha, MD;  Location: [facility];  Service: Cardiovascular;  Laterality: N/A;  . CYSTOSCOPY WITH INSERTION OF UROLIFT N/A 03/19/2015   Procedure: CYSTOSCOPY WITH INSERTION OF FOUR UROLIFTS;  Surgeon: Carolan Clines, MD;  Location: [facility];  Service: Urology;  Laterality: N/A;  . ESOPHAGOGASTRODUODENOSCOPY (EGD) WITH PROPOFOL N/A 02/12/2017   Procedure: ESOPHAGOGASTRODUODENOSCOPY (EGD) WITH PROPOFOL;  Surgeon: Otis Brace, MD;  Location: [facility];  Service: Gastroenterology;  Laterality: N/A;  . ESOPHAGOGASTRODUODENOSCOPY (EGD) WITH PROPOFOL N/A 07/13/2018   Procedure: ESOPHAGOGASTRODUODENOSCOPY (EGD) WITH PROPOFOL;  Surgeon: Clarene Essex, MD;  Location: [facility];  Service: Endoscopy;  Laterality: N/A;  . ESOPHAGOGASTRODUODENOSCOPY (EGD) WITH PROPOFOL N/A 03/25/2019   Procedure: ESOPHAGOGASTRODUODENOSCOPY (EGD) WITH PROPOFOL;  Surgeon: Otis Brace, MD;  Location: [facility];  Service: Gastroenterology;  Laterality: N/A;  . FOREIGN BODY REMOVAL N/A 07/13/2018   Procedure: FOREIGN BODY REMOVAL;  Surgeon: Clarene Essex, MD;  Location: [facility];  Service: Endoscopy;  Laterality: N/A;  . LEFT HEART CATH AND CORS/GRAFTS ANGIOGRAPHY N/A 11/18/2018   Procedure: LEFT HEART CATH AND CORS/GRAFTS ANGIOGRAPHY;  Surgeon: Sherren Mocha, MD;  Location: [facility];  Service: Cardiovascular;  Laterality: N/A;     Medications: No outpatient medications have been marked as taking for the 07/02/19 encounter (Appointment)  with Burtis Junes, NP.     Allergies: Allergies  Allergen Reactions  . Halcion [Triazolam]     "Makes me crazy"  . Pravachol     Unknown    Social History: The patient  reports that he quit smoking about 61 years ago. His smoking use included cigarettes. He has a 15.00 pack-year smoking history. He has never used smokeless tobacco. He reports current alcohol use. He reports that he does not use drugs.   Family History: The patient's ***family history includes Heart failure (age of onset: 76) in his mother.   Review of Systems: Please see the history of present illness.   All other systems are reviewed and negative.   Physical Exam: VS:  There were no vitals taken for this visit. Marland Kitchen  BMI There is no height or weight on file to calculate BMI.  Wt Readings from Last 3 Encounters:  06/23/19 160 lb 8 oz (72.8 kg)  06/11/19 159 lb (72.1 kg)  04/29/19 159  lb (72.1 kg)    General: Pleasant. Well developed, well nourished and in no acute distress.   HEENT: Normal.  Neck: Supple, no JVD, carotid bruits, or masses noted.  Cardiac: ***Regular rate and rhythm. No murmurs, rubs, or gallops. No edema.  Respiratory:  Lungs are clear to auscultation bilaterally with normal work of breathing.  GI: Soft and nontender.  MS: No deformity or atrophy. Gait and ROM intact.  Skin: Warm and dry. Color is normal.  Neuro:  Strength and sensation are intact and no gross focal deficits noted.  Psych: Alert, appropriate and with normal affect.   LABORATORY DATA:  EKG:  EKG {ACTION; IS/IS [id number] ordered today. This demonstrates ***.  Lab Results  Component Value Date   WBC 6.1 06/23/2019   HGB 14.1 06/23/2019   HCT 42.8 06/23/2019   PLT 101 (L) 06/23/2019   GLUCOSE 106 (H) 06/24/2019   CHOL 91 11/15/2018   TRIG 39 11/15/2018   HDL 42 11/15/2018   LDLCALC 41 11/15/2018   ALT 26 06/24/2019   AST 17 06/24/2019   NA 136 06/24/2019   K 3.8 06/24/2019   CL 103 06/24/2019    CREATININE 1.19 06/24/2019   BUN 12 06/24/2019   CO2 24 06/24/2019   TSH 3.330 04/29/2019   INR 1.1 03/25/2019     BNP (last 3 results) Recent Labs    11/14/18 1717  BNP 82.0    ProBNP (last 3 results) No results for input(s): PROBNP in the last 8760 hours.   Other Studies Reviewed Today:  MYOVIEW FNDINGS 06/2019: Perfusion: No decreased activity in the left ventricle on stress imaging to suggest reversible ischemia or infarction.  Wall Motion: Normal left ventricular wall motion. No left ventricular dilation.  Left Ventricular Ejection Fraction: 58 %  End diastolic volume 90 ml  End systolic volume 37 ml  IMPRESSION: 1. No reversible ischemia or infarction.  2. Normal left ventricular wall motion.  3. Left ventricular ejection fraction is 58%.  4. Non invasive risk stratification*: Low  *2012 Appropriate Use Criteria for Coronary Revascularization Focused Update: J Am Coll Cardiol. [id number]. http://content.airportbarriers.com.aspx?articleid=1201161   Electronically Signed   By: Markus Daft M.D.   On: 06/24/2019 13:59   ECHO IMPRESSIONS 06/2019   1. Left ventricular ejection fraction, by estimation, is 60 to 65%. The  left ventricle has normal function. The left ventrical has no regional  wall motion abnormalities. There is moderately increased left ventricular  hypertrophy. Left ventricular  diastolic parameters are consistent with Grade I diastolic dysfunction  (impaired relaxation). Elevated left ventricular end-diastolic pressure.  2. Right ventricular systolic function is normal. The right ventricular  size is normal. Tricuspid regurgitation signal is inadequate for assessing  PA pressure.  3. No evidence of mitral valve regurgitation. Moderate mitral annular  calcification of the posterior MV annulus.  4. The aortic valve is tricuspid. Moderately calcified non coronary cusp.  Aortic valve regurgitation is not  visualized. Aortic valve mean gradient  measures 10.0 mmHg. Aortic valve peak gradient measures 14.5 mmHg. Aortic  valve area, by VTI measures 3.69  cm.  5. The inferior vena cava is normal in size with greater than 50%  respiratory variability, suggesting right atrial pressure of 3 mmHg.   CARDIAC CATH: 11/18/2018 1. Severe native vessel coronary artery disease with total occlusion of the RCA, total occlusion of left circumflex, and severe stenosis leading into total occlusion of the mid LAD 2. Status post multivessel coronary bypass surgery with continued patency of the  saphenous vein graft RCA, sequential saphenous vein graft to OM1 and distal circumflex, and LIMA to diagonal and mid LAD 3. Severe stenosis involving the ostium/proximal portion of the SVG to distal RCA, treated successfully with a 3.5 x 20 mm Synergy DES utilizing distal embolic protection  Recommendations: Dual antiplatelet therapy with aspirin and clopidogrel 12 months as tolerated Intervention   ECHO: 11/15/2018  1. The left ventricle has normal systolic function with an ejection fraction of 60-65%. The cavity size was normal. Left ventricular diastolic Doppler parameters are consistent with impaired relaxation. Elevated left atrial and left ventricular  end-diastolic pressures The E/e' is >15. No evidence of left ventricular regional wall motion abnormalities. 2. Left atrial size was moderately dilated. 3. The mitral valve is abnormal. Mild thickening of the mitral valve leaflet. Mild calcification of the mitral valve leaflet. There is mild to moderate mitral annular calcification present. 4. The tricuspid valve is grossly normal. 5. The aortic valve is abnormal. Moderate calcification of the aortic valve. Aortic valve regurgitation is mild to moderate by color flow Doppler. No stenosis of the aortic valve.   Assessment/Plan:  1. Continued fatigue - most likely multifactorial - given extent of CAD  and age - seems unchanged to me.   2. Leg numbness - has had prior normal ABIs - they would like this repeated.   3. Prior admission x 2 for diverticulitis with microperforation - not discussed today.   4. CAD - prior PCI/DES to SVG to distal RCA - no more angina - however this has not really improved his overall situation. He is on Ranexa. Trying to manage medically. I think the fatigue is multifactorial and will be chronic.   5. HTN - BP is fine - no longer needing Florinef  6. HLD - on statin  7. Memory disorder - this may be impacting his balance more now.   8. Hypothyroid - TSH now normal.   67. Advanced age - he is not happy about getting older - long discussion again today.  Marland Kitchen COVID-19 Education: The signs and symptoms of COVID-19 were discussed with the patient and how to seek care for testing (follow up with PCP or arrange E-visit).  The importance of social distancing, staying at home, hand hygiene and wearing a mask when out in public were discussed today.  Current medicines are reviewed with the patient today.  The patient does not have concerns regarding medicines other than what has been noted above.  The following changes have been made:  See above.  Labs/ tests ordered today include:   No orders of the defined types were placed in this encounter.    Disposition:   FU with *** in {gen number [id number] {Days to years:10300}.   Patient is agreeable to this plan and will call if any problems develop in the interim.   SignedTruitt Merle, NP  06/26/2019 7:31 PM  [facility] [address] [place], [place]  [address] Phone: [phone] Fax: [phone]

## 2019-07-01 ENCOUNTER — Telehealth: Payer: Self-pay | Admitting: *Deleted

## 2019-07-01 NOTE — Telephone Encounter (Signed)
Virtual Visit Pre-Appointment Phone Call  "(Name), I am calling you today to discuss your upcoming appointment. We are currently trying to limit exposure to the virus that causes COVID-19 by seeing patients at home rather than in the office."  1. "What is the BEST phone number to call the day of the visit?" - include this in appointment notes  2. "Do you have or have access to (through a family member/friend) a smartphone with video capability that we can use for your visit?" a. If yes - list this number in appt notes as "cell" (if different from BEST phone #) and list the appointment type as a VIDEO visit in appointment notes b. If no - list the appointment type as a PHONE visit in appointment notes  3. Confirm consent - "In the setting of the current Covid19 crisis, you are scheduled for a (phone or video) visit with your provider on (date) at (time).  Just as we do with many in-office visits, in order for you to participate in this visit, we must obtain consent.  If you'd like, I can send this to your mychart (if signed up) or email for you to review.  Otherwise, I can obtain your verbal consent now.  All virtual visits are billed to your insurance company just like a normal visit would be.  By agreeing to a virtual visit, we'd like you to understand that the technology does not allow for your provider to perform an examination, and thus may limit your provider's ability to fully assess your condition. If your provider identifies any concerns that need to be evaluated in person, we will make arrangements to do so.  Finally, though the technology is pretty good, we cannot assure that it will always work on either your or our end, and in the setting of a video visit, we may have to convert it to a phone-only visit.  In either situation, we cannot ensure that we have a secure connection.  Are you willing to proceed?" STAFF: Did the patient verbally acknowledge consent to telehealth visit? Document  YES/NO here: YES  4. Advise patient to be prepared - "Two hours prior to your appointment, go ahead and check your blood pressure, pulse, oxygen saturation, and your weight (if you have the equipment to check those) and write them all down. When your visit starts, your provider will ask you for this information. If you have an Apple Watch or Kardia device, please plan to have heart rate information ready on the day of your appointment. Please have a pen and paper handy nearby the day of the visit as well."  5. Give patient instructions for MyChart download to smartphone OR [email or web address].me as below if video visit (depending on what platform provider is using)  6. Inform patient they will receive a phone call 15 minutes prior to their appointment time (may be from unknown caller ID) so they should be prepared to answer    TELEPHONE CALL NOTE  Patrick Rica H Dilday has been deemed a candidate for a follow-up tele-health visit to limit community exposure during the Covid-19 pandemic. I spoke with the patient via phone to ensure availability of phone/video source, confirm preferred email & phone number, and discuss instructions and expectations.  I reminded Patrick [name] to be prepared with any vital sign and/or heart rhythm information that could potentially be obtained via home monitoring, at the time of his visit. I reminded Patrick Rica H Truxillo to expect a phone call prior to  his visit.  Telicia Hodgkiss Avanell Shackleton 07/01/2019 7:27 AM   INSTRUCTIONS FOR DOWNLOADING THE MYCHART APP TO SMARTPHONE  - The patient must first make sure to have activated MyChart and know their login information - If Apple, go to App Store and type in MyChart in the search bar and download the app. If Android, ask patient to go to [employer] and type in [place] in the search bar and download the app. The app is free but as with any other app downloads, their phone may require them to verify saved payment information or  Apple/Android password.  - The patient will need to then log into the app with their MyChart username and password, and select [facility] as their healthcare provider to link the account. When it is time for your visit, go to the MyChart app, find appointments, and click Begin Video Visit. Be sure to Select Allow for your device to access the Microphone and Camera for your visit. You will then be connected, and your provider will be with you shortly.  **If they have any issues connecting, or need assistance please contact MyChart service desk [phone])**  **If using a computer, in order to ensure the best quality for their visit they will need to use either of the following Internet Browsers: [employer], or Google Chrome**  IF USING DOXIMITY or DOXY.ME - The patient will receive a link just prior to their visit by text.     FULL LENGTH CONSENT FOR TELE-HEALTH VISIT   I hereby voluntarily request, consent and authorize [facility] and its employed or contracted physicians, physician assistants, nurse practitioners or other licensed health care professionals (the Practitioner), to provide me with telemedicine health care services (the "Services") as deemed necessary by the treating Practitioner. I acknowledge and consent to receive the Services by the Practitioner via telemedicine. I understand that the telemedicine visit will involve communicating with the Practitioner through live audiovisual communication technology and the disclosure of certain medical information by electronic transmission. I acknowledge that I have been given the opportunity to request an in-person assessment or other available alternative prior to the telemedicine visit and am voluntarily participating in the telemedicine visit.  I understand that I have the right to withhold or withdraw my consent to the use of telemedicine in the course of my care at any time, without affecting my right to future care  or treatment, and that the Practitioner or I may terminate the telemedicine visit at any time. I understand that I have the right to inspect all information obtained and/or recorded in the course of the telemedicine visit and may receive copies of available information for a reasonable fee.  I understand that some of the potential risks of receiving the Services via telemedicine include:  Marland Kitchen Delay or interruption in medical evaluation due to technological equipment failure or disruption; . Information transmitted may not be sufficient (e.g. poor resolution of images) to allow for appropriate medical decision making by the Practitioner; and/or  . In rare instances, security protocols could fail, causing a breach of personal health information.  Furthermore, I acknowledge that it is my responsibility to provide information about my medical history, conditions and care that is complete and accurate to the best of my ability. I acknowledge that Practitioner's advice, recommendations, and/or decision may be based on factors not within their control, such as incomplete or inaccurate data provided by me or distortions of diagnostic images or specimens that may result from electronic transmissions. I  understand that the practice of medicine is not an exact science and that Practitioner makes no warranties or guarantees regarding treatment outcomes. I acknowledge that I will receive a copy of this consent concurrently upon execution via email to the email address I last provided but may also request a printed copy by calling the office of [facility].    I understand that my insurance will be billed for this visit.   I have read or had this consent read to me. . I understand the contents of this consent, which adequately explains the benefits and risks of the Services being provided via telemedicine.  . I have been provided ample opportunity to ask questions regarding this consent and the Services and have had  my questions answered to my satisfaction. . I give my informed consent for the services to be provided through the use of telemedicine in my medical care  By participating in this telemedicine visit I agree to the above.

## 2019-07-01 NOTE — Progress Notes (Addendum)
Telehealth Visit     Virtual Visit via Video Note   This visit type was conducted due to national recommendations for restrictions regarding the COVID-19 Pandemic (e.g. social distancing) in an effort to limit this patient's exposure and mitigate transmission in our community.  Due to his co-morbid illnesses, this patient is at least at moderate risk for complications without adequate follow up.  This format is felt to be most appropriate for this patient at this time.  All issues noted in this document were discussed and addressed.  A limited physical exam was performed with this format.  Please refer to the patient's chart for his consent to telehealth for [facility].   Evaluation Performed:  Follow-up visit  This visit type was conducted due to national recommendations for restrictions regarding the COVID-19 Pandemic (e.g. social distancing).  This format is felt to be most appropriate for this patient at this time.  All issues noted in this document were discussed and addressed.  No physical exam was performed (except for noted visual exam findings with Video Visits).  Please refer to the patient's chart (MyChart message for video visits and phone note for telephone visits) for the patient's consent to telehealth for [facility].  Date:  07/02/2019   ID:  [place] H Folson, DOB [birth year], MRN [id number]  Patient Location:  Home  Provider location:   [facility] Office  PCP:  Marton Redwood, MD  Cardiologist:  Servando Snare & Mertie Moores, MD  Electrophysiologist:  None   Chief Complaint:  Post hospital visit.   History of Present Illness:    [place] H Batz is a 86 y.o. male who presents via Engineer, civil (consulting) for a telehealth visit today.  Seen for Dr. Acie Fredrickson. He is a former patient of Dr. Susa Simmonds. He is primarily seeing me now.   He has a known history of CAD with remote CABG from 33 (Dr. Redmond Pulling), HTN, HLD, hypothyroidism and GERD.   His wife reached out to me  just before the July 4th holiday weekend - he was having marked SOB and unstable angina -had not been doing well for several weeks -he was referred for admission.His cardiac enzymes were negative for MI and his ECG was nonacute. An echocardiogram showed preserved left ventricular function with no wall motion abnormalities, butthere wascalciumon both mitral and aortic valves.   He underwent cardiac cath with Dr. Burt Knack on7/10/2018 withDES to the SVG-distal RCA. He was noted to have thrombocytopenia -withno bleeding issues. BP was elevated and his ARB was increased.   His TSH was previously markedly low -he has had his dose of Synthroid reduced, then stopped and now back onincreasingdose.   I have seen him back several times - he developed marked orthostasis - not clear to me as to why -ended upoff most of his medicines - was placed on Florinef with some improvement. His cath films have been reviewed - lots of native disease - trying to manage medically - he was on placed on Ranexa.Then developed abdominal abscess from diverticulitis and was back in the hospital. 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   He has continued to feel poorly - very angry about his overall situation - not coping well with aging. Seen just a few weeks ago - sleeping more - legs cold - they were worried about circulation in his legs - doppler study was normal.   Was then admitted earlier this month  with chest pain - negative Myoview. Echo was repeated and was stable. To manage medically but his medicines were not changed.   The patient does not have symptoms concerning for COVID-19 infection (fever, chills, cough, or new shortness of breath).   Seen today via D[employer] video. He has consented for this visit. His wife augments the history. He is doing better. No more chest pain. Still fatigued but does not seem to complain as much about  this today. He actually used the blower yesterday on the driveway. That was a first. They were not sent home on any new medicine. Have not been checking his BP until today - she notes he was a little excited prior to the call.   Past Medical History:  Diagnosis Date  . BPH (benign prostatic hypertrophy)   . Diverticulosis   . ED (erectile dysfunction)   . GERD (gastroesophageal reflux disease)   . Gout   . Hepatitis    hx hepatitis after mono as teenager  . History of kidney stones   . History of skin cancer   . Hyperlipidemia   . Hypertension   . Hypothyroidism   . IHD (ischemic heart disease)    Remote CABG in 1997  . MI, acute, non ST segment elevation ([place]) 2010   s/p cath with occluded SVG to PD that fills by collaterals and the remainder of his revasculsarization is satisfactory. "the first time they did the stent , the second time they did the graft 8 vessels"  . Nocturia   . Thrombocytopenia ([place]) 11/19/2018  . Urinary frequency    Past Surgical History:  Procedure Laterality Date  . APPENDECTOMY    . BALLOON DILATION N/A 03/25/2019   Procedure: BALLOON DILATION;  Surgeon: Otis Brace, MD;  Location: [facility];  Service: Gastroenterology;  Laterality: N/A;  . CARDIAC CATHETERIZATION  09/08/2008   NORMAL. EF 60%; Occluded SVG to PD that fills by collaterals and the remainder of his revascularization is satisfactory. he is managed medically.   . CORONARY ARTERY BYPASS GRAFT  1997   CABG x 8  . CORONARY STENT INTERVENTION N/A 11/18/2018   Procedure: CORONARY STENT INTERVENTION;  Surgeon: Sherren Mocha, MD;  Location: [place] [facility];  Service: Cardiovascular;  Laterality: N/A;  . CYSTOSCOPY WITH INSERTION OF UROLIFT N/A 03/19/2015   Procedure: CYSTOSCOPY WITH INSERTION OF FOUR UROLIFTS;  Surgeon: Carolan Clines, MD;  Location: [facility];  Service: Urology;  Laterality: N/A;  . ESOPHAGOGASTRODUODENOSCOPY (EGD) WITH PROPOFOL N/A 02/12/2017   Procedure:  ESOPHAGOGASTRODUODENOSCOPY (EGD) WITH PROPOFOL;  Surgeon: Otis Brace, MD;  Location: [facility];  Service: Gastroenterology;  Laterality: N/A;  . ESOPHAGOGASTRODUODENOSCOPY (EGD) WITH PROPOFOL N/A 07/13/2018   Procedure: ESOPHAGOGASTRODUODENOSCOPY (EGD) WITH PROPOFOL;  Surgeon: Clarene Essex, MD;  Location: [facility];  Service: Endoscopy;  Laterality: N/A;  . ESOPHAGOGASTRODUODENOSCOPY (EGD) WITH PROPOFOL N/A 03/25/2019   Procedure: ESOPHAGOGASTRODUODENOSCOPY (EGD) WITH PROPOFOL;  Surgeon: Otis Brace, MD;  Location: [facility];  Service: Gastroenterology;  Laterality: N/A;  . FOREIGN BODY REMOVAL N/A 07/13/2018   Procedure: FOREIGN BODY REMOVAL;  Surgeon: Clarene Essex, MD;  Location: [facility];  Service: Endoscopy;  Laterality: N/A;  . LEFT HEART CATH AND CORS/GRAFTS ANGIOGRAPHY N/A 11/18/2018   Procedure: LEFT HEART CATH AND CORS/GRAFTS ANGIOGRAPHY;  Surgeon: Sherren Mocha, MD;  Location: [place] CV [facility];  Service: Cardiovascular;  Laterality: N/A;     Current Meds  Medication Sig  . allopurinol (ZYLOPRIM) 300 MG tablet Take 300 mg by mouth daily.  Marland Kitchen aspirin  81 MG tablet Take 81 mg by mouth daily.    . clopidogrel (PLAVIX) 75 MG tablet Take 1 tablet (75 mg total) by mouth daily with breakfast.  . donepezil (ARICEPT) 10 MG tablet Take 10 mg by mouth at bedtime.   . mirtazapine (REMERON) 15 MG tablet SMARTSIG:1 Pill By Mouth Every Night  . nitroGLYCERIN (NITROSTAT) 0.4 MG SL tablet Place 1 tablet (0.4 mg total) under the tongue every 5 (five) minutes as needed.  . NONFORMULARY OR COMPOUNDED ITEM Elderly male with impaired mobility, Benefits from outpatient PT per physical therapy.  . pantoprazole (PROTONIX) 40 MG tablet Take 1 tablet (40 mg total) by mouth daily before breakfast.  . ranolazine (RANEXA) 500 MG 12 hr tablet Take 500 mg by mouth 2 (two) times daily.  . sertraline (ZOLOFT) 50 MG tablet Take 50 mg by mouth daily.  . tamsulosin (FLOMAX) 0.4 MG CAPS capsule Take  0.4 mg by mouth daily.   . Vitamin D, Ergocalciferol, (DRISDOL) 50000 units CAPS capsule Take 50,000 Units by mouth once a week.  . [DISCONTINUED] nitroGLYCERIN (NITROSTAT) 0.4 MG SL tablet PLACE 1 TABLET (0.4 MG TOTAL) UNDER THE TONGUE EVERY 5 (FIVE) MINUTES AS NEEDED.     Allergies:   Halcion [triazolam] and Pravachol   Social History   Tobacco Use  . Smoking status: Former Smoker    Packs/day: 1.00    Years: 15.00    Pack years: 15.00    Types: Cigarettes    Quit date: 05/15/1958    Years since quitting: 61.1  . Smokeless tobacco: Never Used  Substance Use Topics  . Alcohol use: Yes    Comment: occasional beer  . Drug use: No     Family Hx: The patient's family history includes Heart failure (age of onset: 55) in his mother.  ROS:   Please see the history of present illness.   All other systems reviewed are negative.     Objective:    Vital Signs:  BP (!) 162/73   Pulse 69   Ht 6' (1.829 m)   Wt 157 lb (71.2 kg)   BMI 21.29 kg/m    Wt Readings from Last 3 Encounters:  07/02/19 157 lb (71.2 kg)  06/23/19 160 lb 8 oz (72.8 kg)  06/11/19 159 lb (72.1 kg)    Alert male in no acute distress. Not short of breath with conversation. BP is elevated today.    Labs/Other Tests and Data Reviewed:    Lab Results  Component Value Date   WBC 6.1 06/23/2019   HGB 14.1 06/23/2019   HCT 42.8 06/23/2019   PLT 101 (L) 06/23/2019   GLUCOSE 106 (H) 06/24/2019   CHOL 91 11/15/2018   TRIG 39 11/15/2018   HDL 42 11/15/2018   LDLCALC 41 11/15/2018   ALT 26 06/24/2019   AST 17 06/24/2019   NA 136 06/24/2019   K 3.8 06/24/2019   CL 103 06/24/2019   CREATININE 1.19 06/24/2019   BUN 12 06/24/2019   CO2 24 06/24/2019   TSH 3.330 04/29/2019   INR 1.1 03/25/2019      BNP (last 3 results) Recent Labs    11/14/18 1717  BNP 82.0    ProBNP (last 3 results) No results for input(s): PROBNP in the last 8760 hours.    Prior CV studies:    The following studies were  reviewed today:  MYOVIEW IMPRESSION 06/2019: 1. No reversible ischemia or infarction.  2. Normal left ventricular wall motion.  3. Left ventricular  ejection fraction is 58%.  4. Non invasive risk stratification*: Low  *2012 Appropriate Use Criteria for Coronary Revascularization Focused Update: J Am Coll Cardiol. N6492421. http://content.airportbarriers.com.aspx?articleid=1201161   Electronically Signed   By: Markus Daft M.D.    ECHO IMPRESSIONS 06/2019 1. Left ventricular ejection fraction, by estimation, is 60 to 65%. The  left ventricle has normal function. The left ventrical has no regional  wall motion abnormalities. There is moderately increased left ventricular  hypertrophy. Left ventricular  diastolic parameters are consistent with Grade I diastolic dysfunction  (impaired relaxation). Elevated left ventricular end-diastolic pressure.  2. Right ventricular systolic function is normal. The right ventricular  size is normal. Tricuspid regurgitation signal is inadequate for assessing  PA pressure.  3. No evidence of mitral valve regurgitation. Moderate mitral annular  calcification of the posterior MV annulus.  4. The aortic valve is tricuspid. Moderately calcified non coronary cusp.  Aortic valve regurgitation is not visualized. Aortic valve mean gradient  measures 10.0 mmHg. Aortic valve peak gradient measures 14.5 mmHg. Aortic  valve area, by VTI measures 3.69  cm.  5. The inferior vena cava is normal in size with greater than 50%  respiratory variability, suggesting right atrial pressure of 3 mmHg.     CARDIAC CATH: 11/18/2018 1. Severe native vessel coronary artery disease with total occlusion of the RCA, total occlusion of left circumflex, and severe stenosis leading into total occlusion of the mid LAD 2. Status post multivessel coronary bypass surgery with continued patency of the saphenous vein graft RCA, sequential saphenous vein graft  to OM1 and distal circumflex, and LIMA to diagonal and mid LAD 3. Severe stenosis involving the ostium/proximal portion of the SVG to distal RCA, treated successfully with a 3.5 x 20 mm Synergy DES utilizing distal embolic protection  Recommendations: Dual antiplatelet therapy with aspirin and clopidogrel 12 months as tolerated Intervention    ASSESSMENT & PLAN:    1. Recent admission for chest pain - most likely this was angina - if recurs - would start Imdur back. Reminded him to use sl NTG as well.   2. Continued fatigue - which I still think is multifactorial.   3. CAD - remote CABG - prior PCI from last summer- severe native disease - remains on Ranexa - his overall situation has not improved - recent admission with low risk Myoview - this was reviewed with them both.   4. Advanced age - he has not coped well with this.   5. HTN - no longer on Florinef. BP is higher today. She will monitor over the next few days.   6. HLD - on statin therapy - not discussed.   7. Memory disorder - has been on Aricept.   8. COVID-19 Education: The signs and symptoms of COVID-19 were discussed with the patient and how to seek care for testing (follow up with PCP or arrange E-visit).  The importance of social distancing, staying at home, hand hygiene and wearing a mask when out in public were discussed today.  Patient Risk:   After full review of this patient's clinical status, I feel that they are at least moderate risk at this time.  Time:   Today, I have spent 14 minutes with the patient with telehealth technology discussing the above issues.     Medication Adjustments/Labs and Tests Ordered: Current medicines are reviewed at length with the patient today.  Concerns regarding medicines are outlined above.   Tests Ordered: No orders of the defined types were  placed in this encounter.   Medication Changes: Meds ordered this encounter  Medications  . nitroGLYCERIN (NITROSTAT) 0.4 MG  SL tablet    Sig: Place 1 tablet (0.4 mg total) under the tongue every 5 (five) minutes as needed.    Dispense:  75 tablet    Refill:  2    Order Specific Question:   Supervising Provider    Answer:   [place], PETER M [id number]    Disposition:  FU with me as planned next month.   Patient is agreeable to this plan and will call if any problems develop in the interim.   Amie Critchley, NP  07/02/2019 3:33 PM    [facility]   07/03/19 Received message from [place] this evening. BP is 192/79 with HR 71.  We will restart Losartan - he has a 100 mg tablet - will start 50 mg a day and continue to monitor.   Burtis Junes, RN, [place] [address] [place], [place]  [phone]

## 2019-07-02 ENCOUNTER — Encounter: Payer: Self-pay | Admitting: Nurse Practitioner

## 2019-07-02 ENCOUNTER — Other Ambulatory Visit: Payer: Self-pay

## 2019-07-02 ENCOUNTER — Telehealth (INDEPENDENT_AMBULATORY_CARE_PROVIDER_SITE_OTHER): Payer: Medicare Other | Admitting: Nurse Practitioner

## 2019-07-02 VITALS — BP 162/73 | HR 69 | Ht 72.0 in | Wt 157.0 lb

## 2019-07-02 DIAGNOSIS — Z7189 Other specified counseling: Secondary | ICD-10-CM

## 2019-07-02 DIAGNOSIS — I2510 Atherosclerotic heart disease of native coronary artery without angina pectoris: Secondary | ICD-10-CM

## 2019-07-02 DIAGNOSIS — E782 Mixed hyperlipidemia: Secondary | ICD-10-CM

## 2019-07-02 DIAGNOSIS — I10 Essential (primary) hypertension: Secondary | ICD-10-CM

## 2019-07-02 DIAGNOSIS — Z9289 Personal history of other medical treatment: Secondary | ICD-10-CM

## 2019-07-02 DIAGNOSIS — R079 Chest pain, unspecified: Secondary | ICD-10-CM

## 2019-07-02 MED ORDER — NITROGLYCERIN 0.4 MG SL SUBL: 0.4000 mg | SUBLINGUAL_TABLET | SUBLINGUAL | 2 refills | Status: DC | PRN

## 2019-07-02 NOTE — Patient Instructions (Addendum)
After Visit Summary:  We will be checking the following labs today - NONE   Medication Instructions:    Continue with your current medicines.   I sent in a refill for the NTG today.    If you need a refill on your cardiac medications before your next appointment, please call your pharmacy.     Testing/Procedures To Be Arranged:  N/A  Follow-Up:   See me as planned next month    At [facility], you and your health needs are our priority.  As part of our continuing mission to provide you with exceptional heart care, we have created designated Provider Care Teams.  These Care Teams include your primary Cardiologist (physician) and Advanced Practice Providers (APPs -  Physician Assistants and Nurse Practitioners) who all work together to provide you with the care you need, when you need it.  Special Instructions:  . Stay safe, stay home, wash your hands for at least 20 seconds and wear a mask when out in public.  . It was good to talk with you today.    Call the [place] office at [phone] if you have any questions, problems or concerns.

## 2019-07-03 ENCOUNTER — Other Ambulatory Visit: Payer: Self-pay | Admitting: *Deleted

## 2019-07-03 MED ORDER — NITROGLYCERIN 0.4 MG SL SUBL: 0.4000 mg | SUBLINGUAL_TABLET | SUBLINGUAL | 2 refills | Status: DC | PRN

## 2019-07-07 ENCOUNTER — Other Ambulatory Visit: Payer: Self-pay | Admitting: Nurse Practitioner

## 2019-07-07 MED ORDER — LOSARTAN POTASSIUM 100 MG PO TABS: 50.0000 mg | ORAL_TABLET | Freq: Every day | ORAL | 3 refills | Status: DC

## 2019-07-16 DIAGNOSIS — C44519 Basal cell carcinoma of skin of other part of trunk: Secondary | ICD-10-CM | POA: Diagnosis not present

## 2019-07-16 DIAGNOSIS — D485 Neoplasm of uncertain behavior of skin: Secondary | ICD-10-CM | POA: Diagnosis not present

## 2019-07-16 DIAGNOSIS — L570 Actinic keratosis: Secondary | ICD-10-CM | POA: Diagnosis not present

## 2019-07-16 DIAGNOSIS — L820 Inflamed seborrheic keratosis: Secondary | ICD-10-CM | POA: Diagnosis not present

## 2019-07-16 DIAGNOSIS — E538 Deficiency of other specified B group vitamins: Secondary | ICD-10-CM | POA: Diagnosis not present

## 2019-07-16 DIAGNOSIS — Z85828 Personal history of other malignant neoplasm of skin: Secondary | ICD-10-CM | POA: Diagnosis not present

## 2019-07-22 NOTE — Progress Notes (Signed)
CARDIOLOGY OFFICE NOTE  Date:  07/29/2019    Patrick Rica H Colt Date of Birth: April 23, 1927 Medical Record [id number]  PCP:  Marton Redwood, MD  Cardiologist:  [facility]    Chief Complaint  Patient presents with  . Follow-up    History of Present Illness: Patrick [name] is a 86 y.o. male who presents today for a follow up visit.  Seen for Dr. Acie Fredrickson. He is a former patient of Dr. Susa Simmonds.He is primarily seeing me now.  He has a known history of CAD with remote CABG from 62 (Dr. Redmond Pulling), HTN, HLD, hypothyroidism and GERD.   His wife reached out to me just before the July 4th holiday weekend - he was having marked SOB and unstable angina -had not been doing well for several weeks -he was referred for admission.His cardiac enzymes were negative for MI and his ECG was nonacute. An echocardiogram showed preserved left ventricular function with no wall motion abnormalities, butthere wascalciumon both mitral and aortic valves.   He underwent cardiac cath with Dr. Burt Knack on7/10/2018 withDES to the SVG-distal RCA. He was noted to have thrombocytopenia -withno bleeding issues. BP was elevated and his ARB was increased.   His TSH was previously markedly low -he has had his dose of Synthroid reduced, then stopped and now back onincreasingdose.   I have seen him back several times - he developed marked orthostasis - not clear to me as to why -ended upoff most of his medicines - was placed on Florinef with some improvement. His cath films have been reviewed - lots of native disease - trying to manage medically - he was on placed on Ranexa.Then developed abdominal abscess from diverticulitis and was back in the hospital. 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  He has continued to feel poorly - very angry about his overall situation - not coping well with aging. He is sleeping more - legs  cold - they were worried about circulation in his legs - doppler study was normal. He was readmitted last month with chest pain - had negative Myoview - echo was repeated and was stable. To manage medically but his medicines were not changed. We last did a telehealth visit last month following that admission - BP started trending up and we added back some ARB.   The patient does not have symptoms concerning for COVID-19 infection (fever, chills, cough, or new shortness of breath).   Comes in today. Here with his wife. Still with lots of fatigue. No chest pain. Not really short of breath - just gives out very easily. Still sleeping at least 15 hours a day. They got another dog - this has made him happy. She notes he is not crying and not as angry with the Zoloft. He is asking what else could be done for him. He is having more issues with his neuropathy. Affecting his fingers - hard to button his shirts. BP has improved at home.    Past Medical History:  Diagnosis Date  . BPH (benign prostatic hypertrophy)   . Diverticulosis   . ED (erectile dysfunction)   . GERD (gastroesophageal reflux disease)   . Gout   . Hepatitis    hx hepatitis after mono as teenager  . History of kidney stones   . History of skin cancer   . Hyperlipidemia   . Hypertension   . Hypothyroidism   .  IHD (ischemic heart disease)    Remote CABG in 1997  . MI, acute, non ST segment elevation ([place]) 2010   s/p cath with occluded SVG to PD that fills by collaterals and the remainder of his revasculsarization is satisfactory. "the first time they did the stent , the second time they did the graft 8 vessels"  . Nocturia   . Thrombocytopenia ([place]) 11/19/2018  . Urinary frequency     Past Surgical History:  Procedure Laterality Date  . APPENDECTOMY    . BALLOON DILATION N/A 03/25/2019   Procedure: BALLOON DILATION;  Surgeon: Otis Brace, MD;  Location: [facility];  Service: Gastroenterology;  Laterality: N/A;  .  CARDIAC CATHETERIZATION  09/08/2008   NORMAL. EF 60%; Occluded SVG to PD that fills by collaterals and the remainder of his revascularization is satisfactory. he is managed medically.   . CORONARY ARTERY BYPASS GRAFT  1997   CABG x 8  . CORONARY STENT INTERVENTION N/A 11/18/2018   Procedure: CORONARY STENT INTERVENTION;  Surgeon: Sherren Mocha, MD;  Location: [facility];  Service: Cardiovascular;  Laterality: N/A;  . CYSTOSCOPY WITH INSERTION OF UROLIFT N/A 03/19/2015   Procedure: CYSTOSCOPY WITH INSERTION OF FOUR UROLIFTS;  Surgeon: Carolan Clines, MD;  Location: [facility];  Service: Urology;  Laterality: N/A;  . ESOPHAGOGASTRODUODENOSCOPY (EGD) WITH PROPOFOL N/A 02/12/2017   Procedure: ESOPHAGOGASTRODUODENOSCOPY (EGD) WITH PROPOFOL;  Surgeon: Otis Brace, MD;  Location: [facility];  Service: Gastroenterology;  Laterality: N/A;  . ESOPHAGOGASTRODUODENOSCOPY (EGD) WITH PROPOFOL N/A 07/13/2018   Procedure: ESOPHAGOGASTRODUODENOSCOPY (EGD) WITH PROPOFOL;  Surgeon: Clarene Essex, MD;  Location: [facility];  Service: Endoscopy;  Laterality: N/A;  . ESOPHAGOGASTRODUODENOSCOPY (EGD) WITH PROPOFOL N/A 03/25/2019   Procedure: ESOPHAGOGASTRODUODENOSCOPY (EGD) WITH PROPOFOL;  Surgeon: Otis Brace, MD;  Location: [facility];  Service: Gastroenterology;  Laterality: N/A;  . FOREIGN BODY REMOVAL N/A 07/13/2018   Procedure: FOREIGN BODY REMOVAL;  Surgeon: Clarene Essex, MD;  Location: [facility];  Service: Endoscopy;  Laterality: N/A;  . LEFT HEART CATH AND CORS/GRAFTS ANGIOGRAPHY N/A 11/18/2018   Procedure: LEFT HEART CATH AND CORS/GRAFTS ANGIOGRAPHY;  Surgeon: Sherren Mocha, MD;  Location: [facility];  Service: Cardiovascular;  Laterality: N/A;     Medications: Current Meds  Medication Sig  . allopurinol (ZYLOPRIM) 300 MG tablet Take 300 mg by mouth daily.  Marland Kitchen aspirin 81 MG tablet Take 81 mg by mouth daily.    Marland Kitchen atorvastatin (LIPITOR) 20 MG tablet Take 1 tablet (20 mg total)  by mouth daily.  . clopidogrel (PLAVIX) 75 MG tablet Take 1 tablet (75 mg total) by mouth daily with breakfast.  . donepezil (ARICEPT) 10 MG tablet Take 10 mg by mouth at bedtime.   Marland Kitchen levothyroxine (SYNTHROID) 100 MCG tablet Take 1 tablet (100 mcg total) by mouth daily before breakfast.  . losartan (COZAAR) 100 MG tablet Take 0.5 tablets (50 mg total) by mouth daily.  . mirtazapine (REMERON) 15 MG tablet SMARTSIG:1 Pill By Mouth Every Night  . nitroGLYCERIN (NITROSTAT) 0.4 MG SL tablet Place 1 tablet (0.4 mg total) under the tongue every 5 (five) minutes as needed.  . NONFORMULARY OR COMPOUNDED ITEM Elderly male with impaired mobility, Benefits from outpatient PT per physical therapy.  . pantoprazole (PROTONIX) 40 MG tablet Take 1 tablet (40 mg total) by mouth daily before breakfast.  . sertraline (ZOLOFT) 50 MG tablet Take 50 mg by mouth daily.  . tamsulosin (FLOMAX) 0.4 MG CAPS capsule Take 0.4 mg by mouth daily.   Marland Kitchen  Vitamin D, Ergocalciferol, (DRISDOL) 50000 units CAPS capsule Take 50,000 Units by mouth once a week.  . [DISCONTINUED] ranolazine (RANEXA) 500 MG 12 hr tablet Take 500 mg by mouth 2 (two) times daily.     Allergies: Allergies  Allergen Reactions  . Halcion [Triazolam]     "Makes me crazy"  . Pravachol     Unknown    Social History: The patient  reports that he quit smoking about 61 years ago. His smoking use included cigarettes. He has a 15.00 pack-year smoking history. He has never used smokeless tobacco. He reports current alcohol use. He reports that he does not use drugs.   Family History: The patient's family history includes Heart failure (age of onset: 21) in his mother.   Review of Systems: Please see the history of present illness.   All other systems are reviewed and negative.   Physical Exam: VS:  BP (!) 142/66   Pulse 73   Ht 6' (1.829 m)   Wt 163 lb 4 oz (74 kg)   SpO2 98%   BMI 22.14 kg/m  .  BMI Body mass index is 22.14 kg/m.  Wt Readings  from Last 3 Encounters:  07/29/19 163 lb 4 oz (74 kg)  07/02/19 157 lb (71.2 kg)  06/23/19 160 lb 8 oz (72.8 kg)    General: Pleasant. Alert and in no acute distress. He looks younger than his stated age.   Cardiac: Regular rate and rhythm. No murmurs, rubs, or gallops. No edema.  Respiratory:  Lungs are clear to auscultation bilaterally with normal work of breathing.  MS: No deformity or atrophy. Gait and ROM intact.  Skin: Warm and dry. Color is normal.  Neuro:  Strength and sensation are intact and no gross focal deficits noted.  Psych: Alert, appropriate and with normal affect.   LABORATORY DATA:  EKG:  EKG is not ordered today.    Lab Results  Component Value Date   WBC 6.1 06/23/2019   HGB 14.1 06/23/2019   HCT 42.8 06/23/2019   PLT 101 (L) 06/23/2019   GLUCOSE 106 (H) 06/24/2019   CHOL 91 11/15/2018   TRIG 39 11/15/2018   HDL 42 11/15/2018   LDLCALC 41 11/15/2018   ALT 26 06/24/2019   AST 17 06/24/2019   NA 136 06/24/2019   K 3.8 06/24/2019   CL 103 06/24/2019   CREATININE 1.19 06/24/2019   BUN 12 06/24/2019   CO2 24 06/24/2019   TSH 3.330 04/29/2019   INR 1.1 03/25/2019       BNP (last 3 results) Recent Labs    11/14/18 1717  BNP 82.0    ProBNP (last 3 results) No results for input(s): PROBNP in the last 8760 hours.   Other Studies Reviewed Today:  MYOVIEW IMPRESSION 06/2019: 1. No reversible ischemia or infarction.  2. Normal left ventricular wall motion.  3. Left ventricular ejection fraction is 58%.  4. Non invasive risk stratification*: Low  *2012 Appropriate Use Criteria for Coronary Revascularization Focused Update: J Am Coll Cardiol. N6492421. http://content.airportbarriers.com.aspx?articleid=1201161   Electronically Signed By: Markus Daft M.D.    ECHO IMPRESSIONS 06/2019 1. Left ventricular ejection fraction, by estimation, is 60 to 65%. The  left ventricle has normal function. The left ventrical has no  regional  wall motion abnormalities. There is moderately increased left ventricular  hypertrophy. Left ventricular  diastolic parameters are consistent with Grade I diastolic dysfunction  (impaired relaxation). Elevated left ventricular end-diastolic pressure.  2. Right ventricular systolic function is  normal. The right ventricular  size is normal. Tricuspid regurgitation signal is inadequate for assessing  PA pressure.  3. No evidence of mitral valve regurgitation. Moderate mitral annular  calcification of the posterior MV annulus.  4. The aortic valve is tricuspid. Moderately calcified non coronary cusp.  Aortic valve regurgitation is not visualized. Aortic valve mean gradient  measures 10.0 mmHg. Aortic valve peak gradient measures 14.5 mmHg. Aortic  valve area, by VTI measures 3.69  cm.  5. The inferior vena cava is normal in size with greater than 50%  respiratory variability, suggesting right atrial pressure of 3 mmHg.     CARDIAC CATH: 11/18/2018 1. Severe native vessel coronary artery disease with total occlusion of the RCA, total occlusion of left circumflex, and severe stenosis leading into total occlusion of the mid LAD 2. Status post multivessel coronary bypass surgery with continued patency of the saphenous vein graft RCA, sequential saphenous vein graft to OM1 and distal circumflex, and LIMA to diagonal and mid LAD 3. Severe stenosis involving the ostium/proximal portion of the SVG to distal RCA, treated successfully with a 3.5 x 20 mm Synergy DES utilizing distal embolic protection  Recommendations: Dual antiplatelet therapy with aspirin and clopidogrel 12 months as tolerated Intervention    ASSESSMENT & PLAN:    1. Recent admission for chest pain - chronic fatigue - may be angina equivalent - will try increasing the Ranexa and see if this helps him. We could use Imdur as well but I do not want to add back and increase Ranexa also.   2. Continued  fatigue - see above.   3. HTN - has had Losartan added back - BP is better.   4. CAD - remote CABG - prior PCI from last summer- severe native disease - remains on Ranexa which we will increase today - recent low risk Myoview.   5. HLD - on statin  6. Memory disorder - has been on Aricept.   7. COVID-19 Education: The signs and symptoms of COVID-19 were discussed with the patient and how to seek care for testing (follow up with PCP or arrange E-visit).  The importance of social distancing, staying at home, hand hygiene and wearing a mask when out in public were discussed today.  Current medicines are reviewed with the patient today.  The patient does not have concerns regarding medicines other than what has been noted above.  The following changes have been made:  See above.  Labs/ tests ordered today include:   No orders of the defined types were placed in this encounter.    Disposition:   FU with me in a month - will do a virtual visit.   Patient is agreeable to this plan and will call if any problems develop in the interim.   SignedTruitt Merle, NP  07/29/2019 4:38 PM  [facility] [address] [place]  [place], [place]  [address] Phone: [phone] Fax: [phone]

## 2019-07-29 ENCOUNTER — Encounter: Payer: Self-pay | Admitting: Nurse Practitioner

## 2019-07-29 ENCOUNTER — Other Ambulatory Visit: Payer: Self-pay

## 2019-07-29 ENCOUNTER — Ambulatory Visit (INDEPENDENT_AMBULATORY_CARE_PROVIDER_SITE_OTHER): Payer: Medicare Other | Admitting: Nurse Practitioner

## 2019-07-29 VITALS — BP 142/66 | HR 73 | Ht 72.0 in | Wt 163.2 lb

## 2019-07-29 DIAGNOSIS — I2510 Atherosclerotic heart disease of native coronary artery without angina pectoris: Secondary | ICD-10-CM | POA: Diagnosis not present

## 2019-07-29 DIAGNOSIS — I10 Essential (primary) hypertension: Secondary | ICD-10-CM

## 2019-07-29 DIAGNOSIS — R5383 Other fatigue: Secondary | ICD-10-CM

## 2019-07-29 DIAGNOSIS — R079 Chest pain, unspecified: Secondary | ICD-10-CM

## 2019-07-29 MED ORDER — RANOLAZINE ER 1000 MG PO TB12: 1000.0000 mg | ORAL_TABLET | Freq: Two times a day (BID) | ORAL | 3 refills | Status: DC

## 2019-07-29 NOTE — Patient Instructions (Addendum)
After Visit Summary:  We will be checking the following labs today - NONE   Medication Instructions:    Continue with your current medicines. BUT  I am increasing the Ranexa to 1000 mg to take twice a day - this is at the pharmacy   If you need a refill on your cardiac medications before your next appointment, please call your pharmacy.     Testing/Procedures To Be Arranged:  N/A  Follow-Up:   See me for a virtual visit in one month    At [facility], you and your health needs are our priority.  As part of our continuing mission to provide you with exceptional heart care, we have created designated Provider Care Teams.  These Care Teams include your primary Cardiologist (physician) and Advanced Practice Providers (APPs -  Physician Assistants and Nurse Practitioners) who all work together to provide you with the care you need, when you need it.  Special Instructions:  . Stay safe, stay home, wash your hands for at least 20 seconds and wear a mask when out in public.  . It was good to talk with you today.    Call the [place] office at [phone] if you have any questions, problems or concerns.

## 2019-08-13 ENCOUNTER — Telehealth: Payer: Self-pay | Admitting: *Deleted

## 2019-08-13 NOTE — Telephone Encounter (Signed)
  Patient Consent for Virtual Visit            CONSENT FOR VIRTUAL VISIT FOR:  Patrick [name]  By participating in this virtual visit I agree to the following:  I hereby voluntarily request, consent and authorize [facility] and its employed or contracted physicians, Engineer, materials, nurse practitioners or other licensed health care professionals (the Practitioner), to provide me with telemedicine health care services (the "Services") as deemed necessary by the treating Practitioner. I acknowledge and consent to receive the Services by the Practitioner via telemedicine. I understand that the telemedicine visit will involve communicating with the Practitioner through live audiovisual communication technology and the disclosure of certain medical information by electronic transmission. I acknowledge that I have been given the opportunity to request an in-person assessment or other available alternative prior to the telemedicine visit and am voluntarily participating in the telemedicine visit.  I understand that I have the right to withhold or withdraw my consent to the use of telemedicine in the course of my care at any time, without affecting my right to future care or treatment, and that the Practitioner or I may terminate the telemedicine visit at any time. I understand that I have the right to inspect all information obtained and/or recorded in the course of the telemedicine visit and may receive copies of available information for a reasonable fee.  I understand that some of the potential risks of receiving the Services via telemedicine include:  Marland Kitchen Delay or interruption in medical evaluation due to technological equipment failure or disruption; . Information transmitted may not be sufficient (e.g. poor resolution of images) to allow for appropriate medical decision making by the Practitioner; and/or  . In rare instances, security protocols could fail, causing a breach of personal health  information.  Furthermore, I acknowledge that it is my responsibility to provide information about my medical history, conditions and care that is complete and accurate to the best of my ability. I acknowledge that Practitioner's advice, recommendations, and/or decision may be based on factors not within their control, such as incomplete or inaccurate data provided by me or distortions of diagnostic images or specimens that may result from electronic transmissions. I understand that the practice of medicine is not an exact science and that Practitioner makes no warranties or guarantees regarding treatment outcomes. I acknowledge that a copy of this consent can be made available to me via my patient portal ([facility]), or I can request a printed copy by calling the office of [facility].    I understand that my insurance will be billed for this visit.   I have read or had this consent read to me. . I understand the contents of this consent, which adequately explains the benefits and risks of the Services being provided via telemedicine.  . I have been provided ample opportunity to ask questions regarding this consent and the Services and have had my questions answered to my satisfaction. . I give my informed consent for the services to be provided through the use of telemedicine in my medical care

## 2019-08-20 NOTE — Progress Notes (Signed)
Telehealth Visit     Virtual Visit via Video Note   This visit type was conducted due to national recommendations for restrictions regarding the COVID-19 Pandemic (e.g. social distancing) in an effort to limit this patient's exposure and mitigate transmission in our community.  Due to his co-morbid illnesses, this patient is at least at moderate risk for complications without adequate follow up.  This format is felt to be most appropriate for this patient at this time.  All issues noted in this document were discussed and addressed.  A limited physical exam was performed with this format.  Please refer to the patient's chart for his consent to telehealth for [facility].   Evaluation Performed:  Follow-up visit  This visit type was conducted due to national recommendations for restrictions regarding the COVID-19 Pandemic (e.g. social distancing).  This format is felt to be most appropriate for this patient at this time.  All issues noted in this document were discussed and addressed.  No physical exam was performed (except for noted visual exam findings with Video Visits).  Please refer to the patient's chart (MyChart message for video visits and phone note for telephone visits) for the patient's consent to telehealth for [facility].  Date:  08/25/2019   ID:  Patrick [name], DOB [birth year], MRN [id number]  Patient Location:  Home  Provider location:   Home  PCP:  Marton Redwood, MD  Cardiologist:  Servando Snare & Mertie Moores, MD  Electrophysiologist:  None   Chief Complaint:  Follow Up  History of Present Illness:    Patrick Rica H Dech is a 86 y.o. male who presents via audio/video conferencing for a telehealth visit today.  Seen for Dr. Acie Fredrickson. He is a former patient of Dr. Susa Simmonds.He is primarily seeing me now.  He has a known history of CAD with remote CABG from 38 (Dr. Redmond Pulling), HTN, HLD, hypothyroidism and GERD.   His wife reached out to me just before the July 4th  holiday weekend - he was having marked SOB and unstable angina -had not been doing well for several weeks -he was referred for admission.His cardiac enzymes were negative for MI and his ECG was nonacute. An echocardiogram showed preserved left ventricular function with no wall motion abnormalities, buttherewascalciumon both mitral and aortic valves.   He underwent cardiac cath with Dr. Burt Knack on7/10/2018 withDES to the SVG-distal RCA. He was noted to have thrombocytopenia -withno bleeding issues. BP was elevated and his ARB was increased.   His TSH waspreviouslymarkedly low -he has had his dose of Synthroid reduced, then stopped and now back onincreasingdose.   I have seen himback several times- he developed marked orthostasis - not clear to me as to why -ended upoff most of his medicines - was placed on Florinef with some improvement. Hiscath films have been reviewed -lots of native disease - trying to manage medically - he was onplaced on Ranexa.Then developed abdominal abscess from diverticulitis and was back in the hospital. 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  He has continued to feel poorly - very angry about his overall situation - not coping well with aging. He is sleeping more - legs cold - they were worried about circulation in his legs - doppler study was normal. He was readmitted in February with chest pain - had negative Myoview - echo was repeated and was stable. To manage medically but his medicines were not changed. We  did a telehealth visit following that admission - BP started trending up and we added back some ARB. Last seen a month ago in the office - still with lots of fatigue - still sleeping 15 hours a day - not as angry and crying with the increase in Zoloft but more issues with his neuropathy. We increased his Ranexa with the hopes that we could improve on his symptoms.    The  patient does not have symptoms concerning for COVID-19 infection (fever, chills, cough, or new shortness of breath).   Seen today via Face Time phone call - declined Caregility. He previously consented for this visit. Still pretty fatigued. Sleeping most of the day as well as night. His legs are bothering him. She is worried - his balance is poor. Wobbly. No falls- yet. Lots of neuropathy. More sedentary. No chest pain noted. Does not sound like the increase in the Ranexa has really helped but he wants to stay on. She notes his mood is drifting back to where he was before Zoloft.    Past Medical History:  Diagnosis Date  . BPH (benign prostatic hypertrophy)   . Diverticulosis   . ED (erectile dysfunction)   . GERD (gastroesophageal reflux disease)   . Gout   . Hepatitis    hx hepatitis after mono as teenager  . History of kidney stones   . History of skin cancer   . Hyperlipidemia   . Hypertension   . Hypothyroidism   . IHD (ischemic heart disease)    Remote CABG in 1997  . MI, acute, non ST segment elevation ([place]) 2010   s/p cath with occluded SVG to PD that fills by collaterals and the remainder of his revasculsarization is satisfactory. "the first time they did the stent , the second time they did the graft 8 vessels"  . Nocturia   . Thrombocytopenia ([place]) 11/19/2018  . Urinary frequency    Past Surgical History:  Procedure Laterality Date  . APPENDECTOMY    . BALLOON DILATION N/A 03/25/2019   Procedure: BALLOON DILATION;  Surgeon: Otis Brace, MD;  Location: [facility];  Service: Gastroenterology;  Laterality: N/A;  . CARDIAC CATHETERIZATION  09/08/2008   NORMAL. EF 60%; Occluded SVG to PD that fills by collaterals and the remainder of his revascularization is satisfactory. he is managed medically.   . CORONARY ARTERY BYPASS GRAFT  1997   CABG x 8  . CORONARY STENT INTERVENTION N/A 11/18/2018   Procedure: CORONARY STENT INTERVENTION;  Surgeon: Sherren Mocha, MD;   Location: [place] [facility];  Service: Cardiovascular;  Laterality: N/A;  . CYSTOSCOPY WITH INSERTION OF UROLIFT N/A 03/19/2015   Procedure: CYSTOSCOPY WITH INSERTION OF FOUR UROLIFTS;  Surgeon: Carolan Clines, MD;  Location: [facility];  Service: Urology;  Laterality: N/A;  . ESOPHAGOGASTRODUODENOSCOPY (EGD) WITH PROPOFOL N/A 02/12/2017   Procedure: ESOPHAGOGASTRODUODENOSCOPY (EGD) WITH PROPOFOL;  Surgeon: Otis Brace, MD;  Location: [facility];  Service: Gastroenterology;  Laterality: N/A;  . ESOPHAGOGASTRODUODENOSCOPY (EGD) WITH PROPOFOL N/A 07/13/2018   Procedure: ESOPHAGOGASTRODUODENOSCOPY (EGD) WITH PROPOFOL;  Surgeon: Clarene Essex, MD;  Location: [facility];  Service: Endoscopy;  Laterality: N/A;  . ESOPHAGOGASTRODUODENOSCOPY (EGD) WITH PROPOFOL N/A 03/25/2019   Procedure: ESOPHAGOGASTRODUODENOSCOPY (EGD) WITH PROPOFOL;  Surgeon: Otis Brace, MD;  Location: [facility];  Service: Gastroenterology;  Laterality: N/A;  . FOREIGN BODY REMOVAL N/A 07/13/2018   Procedure: FOREIGN BODY REMOVAL;  Surgeon: Clarene Essex, MD;  Location: [facility];  Service: Endoscopy;  Laterality: N/A;  . LEFT HEART  CATH AND CORS/GRAFTS ANGIOGRAPHY N/A 11/18/2018   Procedure: LEFT HEART CATH AND CORS/GRAFTS ANGIOGRAPHY;  Surgeon: Sherren Mocha, MD;  Location: [place] [facility];  Service: Cardiovascular;  Laterality: N/A;     Current Meds  Medication Sig  . allopurinol (ZYLOPRIM) 300 MG tablet Take 300 mg by mouth daily.  Marland Kitchen aspirin 81 MG tablet Take 81 mg by mouth daily.    . clopidogrel (PLAVIX) 75 MG tablet Take 1 tablet (75 mg total) by mouth daily with breakfast.  . donepezil (ARICEPT) 10 MG tablet Take 10 mg by mouth at bedtime.   Marland Kitchen losartan (COZAAR) 100 MG tablet Take 0.5 tablets (50 mg total) by mouth daily.  . mirtazapine (REMERON) 15 MG tablet SMARTSIG:1 Pill By Mouth Every Night  . nitroGLYCERIN (NITROSTAT) 0.4 MG SL tablet Place 1 tablet (0.4 mg total) under the tongue every 5 (five)  minutes as needed.  . NONFORMULARY OR COMPOUNDED ITEM Elderly male with impaired mobility, Benefits from outpatient PT per physical therapy.  . pantoprazole (PROTONIX) 40 MG tablet Take 1 tablet (40 mg total) by mouth daily before breakfast.  . ranolazine (RANEXA) 1000 MG SR tablet Take 1 tablet (1,000 mg total) by mouth 2 (two) times daily.  . sertraline (ZOLOFT) 50 MG tablet Take 50 mg by mouth daily.  . tamsulosin (FLOMAX) 0.4 MG CAPS capsule Take 0.4 mg by mouth daily.   . Vitamin D, Ergocalciferol, (DRISDOL) 50000 units CAPS capsule Take 50,000 Units by mouth once a week.     Allergies:   Halcion [triazolam] and Pravachol   Social History   Tobacco Use  . Smoking status: Former Smoker    Packs/day: 1.00    Years: 15.00    Pack years: 15.00    Types: Cigarettes    Quit date: 05/15/1958    Years since quitting: 61.3  . Smokeless tobacco: Never Used  Substance Use Topics  . Alcohol use: Yes    Comment: occasional beer  . Drug use: No     Family Hx: The patient's family history includes Heart failure (age of onset: 8) in his mother.  ROS:   Please see the history of present illness.   All other systems reviewed are negative.    Objective:    Vital Signs:  BP 138/60   Pulse 64    Wt Readings from Last 3 Encounters:  07/29/19 163 lb 4 oz (74 kg)  07/02/19 157 lb (71.2 kg)  06/23/19 160 lb 8 oz (72.8 kg)    Alert male in no acute distress. He looks good. Not short of breath with conversation.    Labs/Other Tests and Data Reviewed:    Lab Results  Component Value Date   WBC 6.1 06/23/2019   HGB 14.1 06/23/2019   HCT 42.8 06/23/2019   PLT 101 (L) 06/23/2019   GLUCOSE 106 (H) 06/24/2019   CHOL 91 11/15/2018   TRIG 39 11/15/2018   HDL 42 11/15/2018   LDLCALC 41 11/15/2018   ALT 26 06/24/2019   AST 17 06/24/2019   NA 136 06/24/2019   K 3.8 06/24/2019   CL 103 06/24/2019   CREATININE 1.19 06/24/2019   BUN 12 06/24/2019   CO2 24 06/24/2019   TSH 3.330  04/29/2019   INR 1.1 03/25/2019       BNP (last 3 results) Recent Labs    11/14/18 1717  BNP 82.0    ProBNP (last 3 results) No results for input(s): PROBNP in the last 8760 hours.  Prior CV studies:    The following studies were reviewed today:   MYOVIEWIMPRESSION2/2021: 1. No reversible ischemia or infarction.  2. Normal left ventricular wall motion.  3. Left ventricular ejection fraction is 58%.  4. Non invasive risk stratification*: Low  *2012 Appropriate Use Criteria for Coronary Revascularization Focused Update: J Am Coll Cardiol. N6492421. http://content.airportbarriers.com.aspx?articleid=1201161   Electronically Signed By: Markus Daft M.D.   ECHOIMPRESSIONS2/2021 1. Left ventricular ejection fraction, by estimation, is 60 to 65%. The  left ventricle has normal function. The left ventrical has no regional  wall motion abnormalities. There is moderately increased left ventricular  hypertrophy. Left ventricular  diastolic parameters are consistent with Grade I diastolic dysfunction  (impaired relaxation). Elevated left ventricular end-diastolic pressure.  2. Right ventricular systolic function is normal. The right ventricular  size is normal. Tricuspid regurgitation signal is inadequate for assessing  PA pressure.  3. No evidence of mitral valve regurgitation. Moderate mitral annular  calcification of the posterior MV annulus.  4. The aortic valve is tricuspid. Moderately calcified non coronary cusp.  Aortic valve regurgitation is not visualized. Aortic valve mean gradient  measures 10.0 mmHg. Aortic valve peak gradient measures 14.5 mmHg. Aortic  valve area, by VTI measures 3.69  cm.  5. The inferior vena cava is normal in size with greater than 50%  respiratory variability, suggesting right atrial pressure of 3 mmHg.     CARDIAC CATH: 11/18/2018 1. Severe native vessel coronary artery disease with total  occlusion of the RCA, total occlusion of left circumflex, and severe stenosis leading into total occlusion of the mid LAD 2. Status post multivessel coronary bypass surgery with continued patency of the saphenous vein graft RCA, sequential saphenous vein graft to OM1 and distal circumflex, and LIMA to diagonal and mid LAD 3. Severe stenosis involving the ostium/proximal portion of the SVG to distal RCA, treated successfully with a 3.5 x 20 mm Synergy DES utilizing distal embolic protection  Recommendations: Dual antiplatelet therapy with aspirin and clopidogrel 12 months as tolerated Intervention    ASSESSMENT & PLAN:     1. CAD - remote CABG with PCI from last summer - has severe native disease - recent low risk Myoview following admission for chest pain - on Ranexa. Options very limited.   2. Chronic fatigue - does not seem any better - unfortunately, this may be chronic for him. I think his options are limited and his symptoms are most likely multifactorial.   3. HTN - BP is fine today. They are checking at home.   4. HLD - on statin  5. Depression/memory disorder  6. Advancing age - lots of neuropathy - they are going to touch base with Dr. Brigitte Pulse.   7. COVID-19 Education: The signs and symptoms of COVID-19 were discussed with the patient and how to seek care for testing (follow up with PCP or arrange E-visit).  The importance of social distancing, staying at home, hand hygiene and wearing a mask when out in public were discussed today.  Patient Risk:   After full review of this patient's clinical status, I feel that they are at least moderate risk at this time.  Time:   Today, I have spent 11 minutes with the patient with telehealth technology discussing the above issues.     Medication Adjustments/Labs and Tests Ordered: Current medicines are reviewed at length with the patient today.  Concerns regarding medicines are outlined above.   Tests Ordered: No orders of the  defined types were placed in  this encounter.   Medication Changes: No orders of the defined types were placed in this encounter.   Disposition:  FU with me in 4 to 6 weeks for in person visit.    Patient is agreeable to this plan and will call if any problems develop in the interim.   Amie Critchley, NP  08/25/2019 3:49 PM    [facility]

## 2019-08-25 ENCOUNTER — Other Ambulatory Visit: Payer: Self-pay

## 2019-08-25 ENCOUNTER — Telehealth (INDEPENDENT_AMBULATORY_CARE_PROVIDER_SITE_OTHER): Payer: Medicare Other | Admitting: Nurse Practitioner

## 2019-08-25 ENCOUNTER — Encounter: Payer: Self-pay | Admitting: Nurse Practitioner

## 2019-08-25 VITALS — BP 138/60 | HR 64

## 2019-08-25 DIAGNOSIS — R5383 Other fatigue: Secondary | ICD-10-CM | POA: Diagnosis not present

## 2019-08-25 DIAGNOSIS — R079 Chest pain, unspecified: Secondary | ICD-10-CM | POA: Diagnosis not present

## 2019-08-25 DIAGNOSIS — Z87891 Personal history of nicotine dependence: Secondary | ICD-10-CM

## 2019-08-25 DIAGNOSIS — E782 Mixed hyperlipidemia: Secondary | ICD-10-CM

## 2019-08-25 DIAGNOSIS — I10 Essential (primary) hypertension: Secondary | ICD-10-CM | POA: Diagnosis not present

## 2019-08-25 DIAGNOSIS — I2510 Atherosclerotic heart disease of native coronary artery without angina pectoris: Secondary | ICD-10-CM | POA: Diagnosis not present

## 2019-08-25 DIAGNOSIS — Z7902 Long term (current) use of antithrombotics/antiplatelets: Secondary | ICD-10-CM

## 2019-08-25 DIAGNOSIS — Z7982 Long term (current) use of aspirin: Secondary | ICD-10-CM

## 2019-08-25 DIAGNOSIS — Z7189 Other specified counseling: Secondary | ICD-10-CM | POA: Diagnosis not present

## 2019-08-25 NOTE — Patient Instructions (Addendum)
After Visit Summary:  We will be checking the following labs today - NONE   Medication Instructions:    Continue with your current medicines.    If you need a refill on your cardiac medications before your next appointment, please call your pharmacy.     Testing/Procedures To Be Arranged:  N/A  Follow-Up:   See me in about 4 to 6 weeks in the office with EKG    At [facility], you and your health needs are our priority.  As part of our continuing mission to provide you with exceptional heart care, we have created designated Provider Care Teams.  These Care Teams include your primary Cardiologist (physician) and Advanced Practice Providers (APPs -  Physician Assistants and Nurse Practitioners) who all work together to provide you with the care you need, when you need it.  Special Instructions:  . Stay safe, stay home, wash your hands for at least 20 seconds and wear a mask when out in public.  . It was good to talk with you both today.    Call the [place] office at [phone] if you have any questions, problems or concerns.

## 2019-09-16 ENCOUNTER — Other Ambulatory Visit: Payer: Self-pay | Admitting: Physician Assistant

## 2019-09-25 ENCOUNTER — Other Ambulatory Visit: Payer: Self-pay | Admitting: Gastroenterology

## 2019-09-25 DIAGNOSIS — R1310 Dysphagia, unspecified: Secondary | ICD-10-CM

## 2019-10-01 NOTE — Progress Notes (Signed)
CARDIOLOGY OFFICE NOTE  Date:  10/08/2019    Patrick [name] Date of Birth: June 30, 1926 Medical Record [id number]  PCP:  Marton Redwood, MD  Cardiologist:  [facility]   Chief Complaint  Patient presents with  . Follow-up    History of Present Illness: Patrick [name] is a 86 y.o. male who presents today for a follow up visit.  Seen for Dr. Acie Fredrickson. He is a former patient of Dr. Susa Simmonds.He is primarily seeing me now.  He has a known history of CAD with remote CABG from 30 (Dr. Redmond Pulling), HTN, HLD, hypothyroidism and GERD.   His wife reached out to me just before the July 4th holiday weekend - he was having marked SOB and unstable angina -had not been doing well for several weeks -he was referred for admission.His cardiac enzymes were negative for MI and his ECG was nonacute. An echocardiogram showed preserved left ventricular function with no wall motion abnormalities, buttherewascalciumon both mitral and aortic valves.   He underwent cardiac cath with Dr. Burt Knack on7/10/2018 withDES to the SVG-distal RCA. He was noted to have thrombocytopenia -withno bleeding issues. BP was elevated and his ARB was increased.   His TSH waspreviouslymarkedly low -he has had his dose of Synthroid reduced, then stopped and now back onincreasingdose.   I have seen himback several times- he developed marked orthostasis - not clear to me as to why -ended upoff most of his medicines - was placed on Florinef with some improvement. Hiscath films have been reviewed -lots of native disease - trying to manage medically - he was onplaced on Ranexa.Then developed abdominal abscess from diverticulitis and was back in the hospital. 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  He has continued to feel poorly - very angry about his overall situation - not coping well with aging.He issleeping more - legs  cold - they were worried about circulation in his legs - doppler study was normal. He was readmitted in February with chest pain - had negative Myoview - echo was repeated and was stable. To manage medically but his medicines were not changed. We did a telehealth visit following that admission - BP started trending up and we added back some ARB.Last seen a month ago in the office - still with lots of fatigue - still sleeping 15 hours a day - not as angry and crying with the increase in Zoloft but more issues with his neuropathy. We have increased his Ranexa with the hopes that we could improve on his symptoms. Unfortunately, not much improvement.   The patient does not have symptoms concerning for COVID-19 infection (fever, chills, cough, or new shortness of breath).   Comes in today. Here with Patrick [name] his wife. She sent me a message this morning - he is having more issues with walking. More tremor. Having trouble holding his razor, sometimes holding a fork, holding a glass, etc. She found him outside his house - he did not know how to get back in the house. Legs are giving a way. He has almost fallen. Still lots of bruising. No chest pain. Breathing seems to be ok. Still sleeping excessively. Mood seems fairly ok. They are planning on traveling to [place] - he is wanting to "see it one more time". Little resistant to getting help at the airport. Having trouble swallowing the 1000 mg Ranexa pills - asking for the 500 mg tablets  instead - taking 2 at a time twice a day.   Past Medical History:  Diagnosis Date  . BPH (benign prostatic hypertrophy)   . Diverticulosis   . ED (erectile dysfunction)   . GERD (gastroesophageal reflux disease)   . Gout   . Hepatitis    hx hepatitis after mono as teenager  . History of kidney stones   . History of skin cancer   . Hyperlipidemia   . Hypertension   . Hypothyroidism   . IHD (ischemic heart disease)    Remote CABG in 1997  . MI, acute, non ST segment  elevation ([place]) 2010   s/p cath with occluded SVG to PD that fills by collaterals and the remainder of his revasculsarization is satisfactory. "the first time they did the stent , the second time they did the graft 8 vessels"  . Nocturia   . Thrombocytopenia ([place]) 11/19/2018  . Urinary frequency     Past Surgical History:  Procedure Laterality Date  . APPENDECTOMY    . BALLOON DILATION N/A 03/25/2019   Procedure: BALLOON DILATION;  Surgeon: Otis Brace, MD;  Location: [facility];  Service: Gastroenterology;  Laterality: N/A;  . CARDIAC CATHETERIZATION  09/08/2008   NORMAL. EF 60%; Occluded SVG to PD that fills by collaterals and the remainder of his revascularization is satisfactory. he is managed medically.   . CORONARY ARTERY BYPASS GRAFT  1997   CABG x 8  . CORONARY STENT INTERVENTION N/A 11/18/2018   Procedure: CORONARY STENT INTERVENTION;  Surgeon: Sherren Mocha, MD;  Location: [facility];  Service: Cardiovascular;  Laterality: N/A;  . CYSTOSCOPY WITH INSERTION OF UROLIFT N/A 03/19/2015   Procedure: CYSTOSCOPY WITH INSERTION OF FOUR UROLIFTS;  Surgeon: Carolan Clines, MD;  Location: [facility];  Service: Urology;  Laterality: N/A;  . ESOPHAGOGASTRODUODENOSCOPY (EGD) WITH PROPOFOL N/A 02/12/2017   Procedure: ESOPHAGOGASTRODUODENOSCOPY (EGD) WITH PROPOFOL;  Surgeon: Otis Brace, MD;  Location: [facility];  Service: Gastroenterology;  Laterality: N/A;  . ESOPHAGOGASTRODUODENOSCOPY (EGD) WITH PROPOFOL N/A 07/13/2018   Procedure: ESOPHAGOGASTRODUODENOSCOPY (EGD) WITH PROPOFOL;  Surgeon: Clarene Essex, MD;  Location: [facility];  Service: Endoscopy;  Laterality: N/A;  . ESOPHAGOGASTRODUODENOSCOPY (EGD) WITH PROPOFOL N/A 03/25/2019   Procedure: ESOPHAGOGASTRODUODENOSCOPY (EGD) WITH PROPOFOL;  Surgeon: Otis Brace, MD;  Location: [facility];  Service: Gastroenterology;  Laterality: N/A;  . FOREIGN BODY REMOVAL N/A 07/13/2018   Procedure: FOREIGN BODY REMOVAL;   Surgeon: Clarene Essex, MD;  Location: [facility];  Service: Endoscopy;  Laterality: N/A;  . LEFT HEART CATH AND CORS/GRAFTS ANGIOGRAPHY N/A 11/18/2018   Procedure: LEFT HEART CATH AND CORS/GRAFTS ANGIOGRAPHY;  Surgeon: Sherren Mocha, MD;  Location: [facility];  Service: Cardiovascular;  Laterality: N/A;     Medications: Current Meds  Medication Sig  . allopurinol (ZYLOPRIM) 300 MG tablet Take 300 mg by mouth daily.  Marland Kitchen aspirin 81 MG tablet Take 81 mg by mouth daily.    Marland Kitchen atorvastatin (LIPITOR) 20 MG tablet Take 1 tablet (20 mg total) by mouth daily.  . clopidogrel (PLAVIX) 75 MG tablet TAKE 1 TABLET (75 MG TOTAL) BY MOUTH DAILY WITH BREAKFAST.  Marland Kitchen donepezil (ARICEPT) 10 MG tablet Take 10 mg by mouth at bedtime.   . gabapentin (NEURONTIN) 100 MG capsule Take 100 mg by mouth 2 (two) times daily.  Marland Kitchen levothyroxine (SYNTHROID) 100 MCG tablet Take 1 tablet (100 mcg total) by mouth daily before breakfast.  . losartan (COZAAR) 100 MG tablet Take 0.5 tablets (50 mg total) by mouth daily.  Marland Kitchen  mirtazapine (REMERON) 15 MG tablet SMARTSIG:1 Pill By Mouth Every Night  . nitroGLYCERIN (NITROSTAT) 0.4 MG SL tablet Place 1 tablet (0.4 mg total) under the tongue every 5 (five) minutes as needed.  . NONFORMULARY OR COMPOUNDED ITEM Elderly male with impaired mobility, Benefits from outpatient PT per physical therapy.  . pantoprazole (PROTONIX) 40 MG tablet Take 1 tablet (40 mg total) by mouth daily before breakfast.  . sertraline (ZOLOFT) 100 MG tablet Take 100 mg by mouth daily.  . tamsulosin (FLOMAX) 0.4 MG CAPS capsule Take 0.4 mg by mouth daily.   . Vitamin D, Ergocalciferol, (DRISDOL) 50000 units CAPS capsule Take 50,000 Units by mouth once a week.  . [DISCONTINUED] ranolazine (RANEXA) 1000 MG SR tablet Take 1 tablet (1,000 mg total) by mouth 2 (two) times daily.     Allergies: Allergies  Allergen Reactions  . Halcion [Triazolam]     "Makes me crazy"  . Pravachol     Unknown    Social  History: The patient  reports that he quit smoking about 61 years ago. His smoking use included cigarettes. He has a 15.00 pack-year smoking history. He has never used smokeless tobacco. He reports current alcohol use. He reports that he does not use drugs.   Family History: The patient's family history includes Heart failure (age of onset: 48) in his mother.   Review of Systems: Please see the history of present illness.   All other systems are reviewed and negative.   Physical Exam: VS:  BP 118/68   Pulse 68   Ht 6' (1.829 m)   Wt 164 lb (74.4 kg)   SpO2 97%   BMI 22.24 kg/m  .  BMI Body mass index is 22.24 kg/m.  Wt Readings from Last 3 Encounters:  10/08/19 164 lb (74.4 kg)  07/29/19 163 lb 4 oz (74 kg)  07/02/19 157 lb (71.2 kg)    General: Pleasant. Alert and in no acute distress. He cannot walk a straight line - he is swaying. Fine tremor of his hands is noted today.  Skin is pretty bruised up.  Cardiac: Regular rate and rhythm. No murmurs, rubs, or gallops. No edema.  Respiratory:  Lungs are clear to auscultation bilaterally with normal work of breathing.  GI: Soft and nontender.  MS: No deformity or atrophy. Gait and ROM intact but walks with some difficulty.  Skin: Warm and dry. Color is normal.  Neuro:  Strength and sensation are intact and no gross focal deficits noted.  Psych: Alert, appropriate and with normal affect.   LABORATORY DATA:  EKG:  EKG is ordered today.  Personally reviewed by me. This demonstrates NSr.  Lab Results  Component Value Date   WBC 6.1 06/23/2019   HGB 14.1 06/23/2019   HCT 42.8 06/23/2019   PLT 101 (L) 06/23/2019   GLUCOSE 106 (H) 06/24/2019   CHOL 91 11/15/2018   TRIG 39 11/15/2018   HDL 42 11/15/2018   LDLCALC 41 11/15/2018   ALT 26 06/24/2019   AST 17 06/24/2019   NA 136 06/24/2019   K 3.8 06/24/2019   CL 103 06/24/2019   CREATININE 1.19 06/24/2019   BUN 12 06/24/2019   CO2 24 06/24/2019   TSH 3.330 04/29/2019   INR  1.1 03/25/2019        BNP (last 3 results) Recent Labs    11/14/18 1717  BNP 82.0    ProBNP (last 3 results) No results for input(s): PROBNP in the last 8760 hours.  Other Studies Reviewed Today:   MYOVIEWIMPRESSION2/2021: 1. No reversible ischemia or infarction.  2. Normal left ventricular wall motion.  3. Left ventricular ejection fraction is 58%.  4. Non invasive risk stratification*: Low  *2012 Appropriate Use Criteria for Coronary Revascularization Focused Update: J Am Coll Cardiol. N6492421. http://content.airportbarriers.com.aspx?articleid=1201161   Electronically Signed By: Markus Daft M.D.   ECHOIMPRESSIONS2/2021 1. Left ventricular ejection fraction, by estimation, is 60 to 65%. The  left ventricle has normal function. The left ventrical has no regional  wall motion abnormalities. There is moderately increased left ventricular  hypertrophy. Left ventricular  diastolic parameters are consistent with Grade I diastolic dysfunction  (impaired relaxation). Elevated left ventricular end-diastolic pressure.  2. Right ventricular systolic function is normal. The right ventricular  size is normal. Tricuspid regurgitation signal is inadequate for assessing  PA pressure.  3. No evidence of mitral valve regurgitation. Moderate mitral annular  calcification of the posterior MV annulus.  4. The aortic valve is tricuspid. Moderately calcified non coronary cusp.  Aortic valve regurgitation is not visualized. Aortic valve mean gradient  measures 10.0 mmHg. Aortic valve peak gradient measures 14.5 mmHg. Aortic  valve area, by VTI measures 3.69  cm.  5. The inferior vena cava is normal in size with greater than 50%  respiratory variability, suggesting right atrial pressure of 3 mmHg.     CARDIAC CATH: 11/18/2018 1. Severe native vessel coronary artery disease with total occlusion of the RCA, total occlusion of left circumflex,  and severe stenosis leading into total occlusion of the mid LAD 2. Status post multivessel coronary bypass surgery with continued patency of the saphenous vein graft RCA, sequential saphenous vein graft to OM1 and distal circumflex, and LIMA to diagonal and mid LAD 3. Severe stenosis involving the ostium/proximal portion of the SVG to distal RCA, treated successfully with a 3.5 x 20 mm Synergy DES utilizing distal embolic protection  Recommendations: Dual antiplatelet therapy with aspirin and clopidogrel 12 months as tolerated Intervention    ASSESSMENT & PLAN:     1. CAD - has had remote CABG - s/p PCI from July of 2020 - almost one year out - prior low risk Myoview - managed medically - I have changed his to the 500 mg Ranexa - taking 2 BID - he can swallow these.   2. Persistent fatigue - I think this is all multifactorial - I don't think there is much to do about this. I think safety is his primary goal.   3. HTN - BP ok on his current regimen - no changes made.   4. HLD - on statin  5. Tremor/trouble walking/confusion - they would like to see Neurology - I placed the referral - they are also seeing Dr. Brigitte Pulse tomorrow.   6. Depression - this seems some better to me - at least his disposition here today is not as angry as he has been.   7. Advancing age - lots of neuropathy - slow general decline noted. Safety is primary goal.   8. COVID-19 Education: The signs and symptoms of COVID-19 were discussed with the patient and how to seek care for testing (follow up with PCP or arrange E-visit).  The importance of social distancing, staying at home, hand hygiene and wearing a mask when out in public were discussed today. They have been vaccinated.   Current medicines are reviewed with the patient today.  The patient does not have concerns regarding medicines other than what has been noted above.  The following  changes have been made:  See above.  Labs/ tests ordered today  include:    Orders Placed This Encounter  Procedures  . Ambulatory referral to Neurology     Disposition:   FU with me in 2 months.     Patient is agreeable to this plan and will call if any problems develop in the interim.   SignedTruitt Merle, NP  10/08/2019 4:29 PM  [facility] [address] [place], [place]  [address] Phone: [phone] Fax: [phone]

## 2019-10-07 DIAGNOSIS — E0590 Thyrotoxicosis, unspecified without thyrotoxic crisis or storm: Secondary | ICD-10-CM | POA: Diagnosis not present

## 2019-10-07 DIAGNOSIS — M109 Gout, unspecified: Secondary | ICD-10-CM | POA: Diagnosis not present

## 2019-10-07 DIAGNOSIS — E7849 Other hyperlipidemia: Secondary | ICD-10-CM | POA: Diagnosis not present

## 2019-10-07 DIAGNOSIS — R7301 Impaired fasting glucose: Secondary | ICD-10-CM | POA: Diagnosis not present

## 2019-10-07 DIAGNOSIS — E538 Deficiency of other specified B group vitamins: Secondary | ICD-10-CM | POA: Diagnosis not present

## 2019-10-07 DIAGNOSIS — M859 Disorder of bone density and structure, unspecified: Secondary | ICD-10-CM | POA: Diagnosis not present

## 2019-10-08 ENCOUNTER — Ambulatory Visit (INDEPENDENT_AMBULATORY_CARE_PROVIDER_SITE_OTHER): Payer: Medicare Other | Admitting: Nurse Practitioner

## 2019-10-08 ENCOUNTER — Other Ambulatory Visit: Payer: Self-pay

## 2019-10-08 ENCOUNTER — Encounter: Payer: Self-pay | Admitting: Nurse Practitioner

## 2019-10-08 VITALS — BP 118/68 | HR 68 | Ht 72.0 in | Wt 164.0 lb

## 2019-10-08 DIAGNOSIS — R5383 Other fatigue: Secondary | ICD-10-CM | POA: Diagnosis not present

## 2019-10-08 DIAGNOSIS — R251 Tremor, unspecified: Secondary | ICD-10-CM

## 2019-10-08 DIAGNOSIS — I10 Essential (primary) hypertension: Secondary | ICD-10-CM

## 2019-10-08 DIAGNOSIS — I2510 Atherosclerotic heart disease of native coronary artery without angina pectoris: Secondary | ICD-10-CM | POA: Diagnosis not present

## 2019-10-08 DIAGNOSIS — Z7189 Other specified counseling: Secondary | ICD-10-CM

## 2019-10-08 DIAGNOSIS — R200 Anesthesia of skin: Secondary | ICD-10-CM

## 2019-10-08 MED ORDER — RANOLAZINE ER 500 MG PO TB12: 1000.0000 mg | ORAL_TABLET | Freq: Two times a day (BID) | ORAL | 11 refills | Status: DC

## 2019-10-08 NOTE — Patient Instructions (Addendum)
After Visit Summary:  We will be checking the following labs today - NONE   Medication Instructions:    Continue with your current medicines. BUT  I will change the Ranexa to 500 mg to take 2 tablets (total 1000 mg) twice a day   If you need a refill on your cardiac medications before your next appointment, please call your pharmacy.     Testing/Procedures To Be Arranged:  N/A  Follow-Up:   See me in about 2 months  I am placing referral to see Neurology    At [facility], you and your health needs are our priority.  As part of our continuing mission to provide you with exceptional heart care, we have created designated Provider Care Teams.  These Care Teams include your primary Cardiologist (physician) and Advanced Practice Providers (APPs -  Physician Assistants and Nurse Practitioners) who all work together to provide you with the care you need, when you need it.  Special Instructions:  . Stay safe, stay home, wash your hands for at least 20 seconds and wear a mask when out in public.  . It was good to talk with you today.    Call the [facility] office at [phone] if you have any questions, problems or concerns.

## 2019-10-09 DIAGNOSIS — F0280 Dementia in other diseases classified elsewhere without behavioral disturbance: Secondary | ICD-10-CM | POA: Diagnosis not present

## 2019-10-09 DIAGNOSIS — E46 Unspecified protein-calorie malnutrition: Secondary | ICD-10-CM | POA: Diagnosis not present

## 2019-10-09 DIAGNOSIS — F3341 Major depressive disorder, recurrent, in partial remission: Secondary | ICD-10-CM | POA: Diagnosis not present

## 2019-10-09 DIAGNOSIS — N1831 Chronic kidney disease, stage 3a: Secondary | ICD-10-CM | POA: Diagnosis not present

## 2019-10-09 DIAGNOSIS — I10 Essential (primary) hypertension: Secondary | ICD-10-CM | POA: Diagnosis not present

## 2019-10-09 DIAGNOSIS — Z9861 Coronary angioplasty status: Secondary | ICD-10-CM | POA: Diagnosis not present

## 2019-10-09 DIAGNOSIS — R82998 Other abnormal findings in urine: Secondary | ICD-10-CM | POA: Diagnosis not present

## 2019-10-09 DIAGNOSIS — E7849 Other hyperlipidemia: Secondary | ICD-10-CM | POA: Diagnosis not present

## 2019-10-09 DIAGNOSIS — E538 Deficiency of other specified B group vitamins: Secondary | ICD-10-CM | POA: Diagnosis not present

## 2019-10-09 DIAGNOSIS — G609 Hereditary and idiopathic neuropathy, unspecified: Secondary | ICD-10-CM | POA: Diagnosis not present

## 2019-10-09 DIAGNOSIS — D696 Thrombocytopenia, unspecified: Secondary | ICD-10-CM | POA: Diagnosis not present

## 2019-10-09 DIAGNOSIS — D692 Other nonthrombocytopenic purpura: Secondary | ICD-10-CM | POA: Diagnosis not present

## 2019-10-09 DIAGNOSIS — Z0000 Encounter for general adult medical examination without abnormal findings: Secondary | ICD-10-CM | POA: Diagnosis not present

## 2019-10-09 NOTE — Addendum Note (Signed)
Addended by: Jeremy Johann on: 10/09/2019 05:03 PM   Modules accepted: Orders

## 2019-10-20 DIAGNOSIS — R509 Fever, unspecified: Secondary | ICD-10-CM | POA: Diagnosis not present

## 2019-10-20 DIAGNOSIS — G250 Essential tremor: Secondary | ICD-10-CM | POA: Diagnosis not present

## 2019-10-20 DIAGNOSIS — Z1152 Encounter for screening for COVID-19: Secondary | ICD-10-CM | POA: Diagnosis not present

## 2019-10-20 DIAGNOSIS — N390 Urinary tract infection, site not specified: Secondary | ICD-10-CM | POA: Diagnosis not present

## 2019-10-20 DIAGNOSIS — D696 Thrombocytopenia, unspecified: Secondary | ICD-10-CM | POA: Diagnosis not present

## 2019-10-22 ENCOUNTER — Telehealth: Payer: Self-pay

## 2019-10-22 NOTE — Telephone Encounter (Signed)
**Note De-Identified  Obfuscation** I started a quantity exception for the pts Ranolazine through covermymeds. Key: [id number]

## 2019-11-10 ENCOUNTER — Telehealth: Payer: Self-pay | Admitting: *Deleted

## 2019-11-10 NOTE — Telephone Encounter (Signed)
S/w pt's spouse per [facility]) is aware of new appt date and time.

## 2019-11-18 ENCOUNTER — Other Ambulatory Visit: Payer: Self-pay | Admitting: Nurse Practitioner

## 2019-11-24 ENCOUNTER — Telehealth: Payer: Self-pay | Admitting: Nurse Practitioner

## 2019-11-24 NOTE — Telephone Encounter (Signed)
[name] calling to speak with Andee Poles about the patient. She did not want to give any further information.

## 2019-11-24 NOTE — Telephone Encounter (Signed)
S/w pt's wife moved pt's appt out one day due to pt going to the beach.  This new slot is on Wednesday, July 28 @ 3:15.   Will send to [place] to [place].

## 2019-12-02 NOTE — Progress Notes (Signed)
CARDIOLOGY OFFICE NOTE  Date:  12/10/2019    Patrick Rica H Cogar Date of Birth: [birth year] Medical Record #[id number]  PCP:  Marton Redwood, MD  Cardiologist:  [facility]    Chief Complaint  Patient presents with  . Follow-up    History of Present Illness: Patrick [name] is a 86 y.o. male who presents today for a follow up visit. Seen for Dr. Acie Fredrickson. He is a former patient of Dr. Susa Simmonds.He is primarily seeing me now.  He has a known history of CAD with remote CABG from 50 (Dr. Redmond Pulling), HTN, HLD, hypothyroidism and GERD.   His wife reached out to me just before the July 4th holiday weekend - he was having marked SOB and unstable angina -had not been doing well for several weeks -he was referred for admission.His cardiac enzymes were negative for MI and his ECG was nonacute. An echocardiogram showed preserved left ventricular function with no wall motion abnormalities, buttherewascalciumon both mitral and aortic valves.   He underwent cardiac cath with Dr. Burt Knack on7/10/2018 withDES to the SVG-distal RCA. He was noted to have thrombocytopenia -withno bleeding issues. BP was elevated and his ARB was increased.   His TSH waspreviouslymarkedly low -he has had his dose of Synthroid reduced, then stopped and now back onincreasingdose.   I have seen himback several times- he developed marked orthostasis - not clear to me as to why -ended upoff most of his medicines - was placed on Florinef with some improvement. Hiscath films have been reviewed -lots of native disease - trying to manage medically - he was onplaced on Ranexa.Then developed abdominal abscess from diverticulitis and was back in the hospital. 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  He has continued to feel poorly - very angry about his overall situation - not coping well with aging.He issleeping more - legs  cold - they were worried about circulation in his legs - doppler study was normal. He was readmittedin Februarywith chest pain - had negative Myoview - echo was repeated and was stable. To manage medically but his medicines were not changed. We did a telehealth visit following that admission - BP started trending up and we added back some ARB.Has continued to have lots of fatigue - still sleeping 15 hours a day - not as angry and crying with the increase in Zoloft by PCP but more issues with his neuropathy. We have increased his Ranexa with the hopes that we could improve on his symptoms.Unfortunately, not much improvement. I last saw him in May - balance getting worse - having trouble holding razor, fork, etc - was referred to Neurology.   Comes in today. Here with [name] - she tells me prior to the visit - he has had several falls - he has hit his head. Fell in the garage, fell at the gas station.  His legs are very weak. Confused at times. Doesn't remember their house in [place] that they just moved from. Still sleeping 15 hours a day. Seeing neurology next week - hopeful for some answers but also very realistic that this situation is probably not going to improve. No chest pain. He is not short of breath. Lots of bruising.   Past Medical History:  Diagnosis Date  . BPH (benign prostatic hypertrophy)   . Diverticulosis   . ED (erectile dysfunction)   . GERD (gastroesophageal reflux disease)   . Gout   .  Hepatitis    hx hepatitis after mono as teenager  . History of kidney stones   . History of skin cancer   . Hyperlipidemia   . Hypertension   . Hypothyroidism   . IHD (ischemic heart disease)    Remote CABG in 1997  . MI, acute, non ST segment elevation ([place]) 2010   s/p cath with occluded SVG to PD that fills by collaterals and the remainder of his revasculsarization is satisfactory. "the first time they did the stent , the second time they did the graft 8 vessels"  . Nocturia   .  Thrombocytopenia ([place]) 11/19/2018  . Urinary frequency     Past Surgical History:  Procedure Laterality Date  . APPENDECTOMY    . BALLOON DILATION N/A 03/25/2019   Procedure: BALLOON DILATION;  Surgeon: Otis Brace, MD;  Location: [facility];  Service: Gastroenterology;  Laterality: N/A;  . CARDIAC CATHETERIZATION  09/08/2008   NORMAL. EF 60%; Occluded SVG to PD that fills by collaterals and the remainder of his revascularization is satisfactory. he is managed medically.   . CORONARY ARTERY BYPASS GRAFT  1997   CABG x 8  . CORONARY STENT INTERVENTION N/A 11/18/2018   Procedure: CORONARY STENT INTERVENTION;  Surgeon: Sherren Mocha, MD;  Location: [place] [facility];  Service: Cardiovascular;  Laterality: N/A;  . CYSTOSCOPY WITH INSERTION OF UROLIFT N/A 03/19/2015   Procedure: CYSTOSCOPY WITH INSERTION OF FOUR UROLIFTS;  Surgeon: Carolan Clines, MD;  Location: [facility];  Service: Urology;  Laterality: N/A;  . ESOPHAGOGASTRODUODENOSCOPY (EGD) WITH PROPOFOL N/A 02/12/2017   Procedure: ESOPHAGOGASTRODUODENOSCOPY (EGD) WITH PROPOFOL;  Surgeon: Otis Brace, MD;  Location: [facility];  Service: Gastroenterology;  Laterality: N/A;  . ESOPHAGOGASTRODUODENOSCOPY (EGD) WITH PROPOFOL N/A 07/13/2018   Procedure: ESOPHAGOGASTRODUODENOSCOPY (EGD) WITH PROPOFOL;  Surgeon: Clarene Essex, MD;  Location: [facility];  Service: Endoscopy;  Laterality: N/A;  . ESOPHAGOGASTRODUODENOSCOPY (EGD) WITH PROPOFOL N/A 03/25/2019   Procedure: ESOPHAGOGASTRODUODENOSCOPY (EGD) WITH PROPOFOL;  Surgeon: Otis Brace, MD;  Location: [facility];  Service: Gastroenterology;  Laterality: N/A;  . FOREIGN BODY REMOVAL N/A 07/13/2018   Procedure: FOREIGN BODY REMOVAL;  Surgeon: Clarene Essex, MD;  Location: [facility];  Service: Endoscopy;  Laterality: N/A;  . LEFT HEART CATH AND CORS/GRAFTS ANGIOGRAPHY N/A 11/18/2018   Procedure: LEFT HEART CATH AND CORS/GRAFTS ANGIOGRAPHY;  Surgeon: Sherren Mocha, MD;  Location:  [facility];  Service: Cardiovascular;  Laterality: N/A;     Medications: Current Meds  Medication Sig  . allopurinol (ZYLOPRIM) 100 MG tablet Take 100 mg by mouth [name].  Marland Kitchen aspirin 81 MG tablet Take 81 mg by mouth [name].    Marland Kitchen donepezil (ARICEPT) 10 MG tablet Take 10 mg by mouth at bedtime.   . finasteride (PROSCAR) 5 MG tablet Take 5 mg by mouth [name].  Marland Kitchen gabapentin (NEURONTIN) 300 MG capsule Take 300 mg by mouth 2 (two) times [name].  Marland Kitchen levothyroxine (SYNTHROID) 112 MCG tablet Take 112 mcg by mouth [name].  . mirtazapine (REMERON) 15 MG tablet SMARTSIG:1 Pill By Mouth Every Night  . nitroGLYCERIN (NITROSTAT) 0.4 MG SL tablet Place 1 tablet (0.4 mg total) under the tongue every 5 (five) minutes as needed.  . NONFORMULARY OR COMPOUNDED ITEM Elderly male with impaired mobility, Benefits from outpatient PT per physical therapy.  . pantoprazole (PROTONIX) 40 MG tablet Take 1 tablet (40 mg total) by mouth [name] before breakfast.  . ranolazine (RANEXA) 500 MG 12 hr tablet Take 2 tablets (1,000 mg total) by mouth 2 (  two) times [name].  . sertraline (ZOLOFT) 100 MG tablet Take 100 mg by mouth [name].  . tamsulosin (FLOMAX) 0.4 MG CAPS capsule Take 0.4 mg by mouth [name].   . Vitamin D, Ergocalciferol, (DRISDOL) 50000 units CAPS capsule Take 50,000 Units by mouth once a week.  . [DISCONTINUED] clopidogrel (PLAVIX) 75 MG tablet TAKE 1 TABLET (75 MG TOTAL) BY MOUTH [name] WITH BREAKFAST.     Allergies: Allergies  Allergen Reactions  . Halcion [Triazolam]     "Makes me crazy"  . Pravachol     Unknown    Social History: The patient  reports that he quit smoking about 61 years ago. His smoking use included cigarettes. He has a 15.00 pack-year smoking history. He has never used smokeless tobacco. He reports current alcohol use. He reports that he does not use drugs.   Family History: The patient's family history includes Heart failure (age of onset: 62) in his mother.   Review of  Systems: Please see the history of present illness.   All other systems are reviewed and negative.   Physical Exam: VS:  BP (!) 140/80   Pulse 80   Ht 6' (1.829 m)   Wt 164 lb 6.4 oz (74.6 kg)   SpO2 98%   BMI 22.30 kg/m  .  BMI Body mass index is 22.3 kg/m.  Wt Readings from Last 3 Encounters:  12/10/19 164 lb 6.4 oz (74.6 kg)  10/08/19 164 lb (74.4 kg)  07/29/19 163 lb 4 oz (74 kg)    General: Alert and in no acute distress.   Cardiac: Regular rate and rhythm. No murmurs, rubs, or gallops. No edema.  Respiratory:  Lungs are clear to auscultation bilaterally with normal work of breathing.  GI: Soft and nontender.  MS: No deformity or atrophy. Gait and ROM intact.  Skin: Warm and dry. Color is normal.  Neuro:  Strength and sensation are intact and no gross focal deficits noted.  Psych: Alert, appropriate and with normal affect.   LABORATORY DATA:  EKG:  EKG is not ordered today.    Lab Results  Component Value Date   WBC 6.1 06/23/2019   HGB 14.1 06/23/2019   HCT 42.8 06/23/2019   PLT 101 (L) 06/23/2019   GLUCOSE 106 (H) 06/24/2019   CHOL 91 11/15/2018   TRIG 39 11/15/2018   HDL 42 11/15/2018   LDLCALC 41 11/15/2018   ALT 26 06/24/2019   AST 17 06/24/2019   NA 136 06/24/2019   K 3.8 06/24/2019   CL 103 06/24/2019   CREATININE 1.19 06/24/2019   BUN 12 06/24/2019   CO2 24 06/24/2019   TSH 3.330 04/29/2019   INR 1.1 03/25/2019       BNP (last 3 results) No results for input(s): BNP in the last 8760 hours.  ProBNP (last 3 results) No results for input(s): PROBNP in the last 8760 hours.   Other Studies Reviewed Today:  MYOVIEWIMPRESSION2/2021: 1. No reversible ischemia or infarction.  2. Normal left ventricular wall motion.  3. Left ventricular ejection fraction is 58%.  4. Non invasive risk stratification*: Low  *2012 Appropriate Use Criteria for Coronary Revascularization Focused Update: J Am Coll Cardiol.  7588;32(5):498-264. http://content.airportbarriers.com.aspx?articleid=1201161   Electronically Signed By: Markus Daft M.D.   ECHOIMPRESSIONS2/2021 1. Left ventricular ejection fraction, by estimation, is 60 to 65%. The  left ventricle has normal function. The left ventrical has no regional  wall motion abnormalities. There is moderately increased left ventricular  hypertrophy. Left ventricular  diastolic  parameters are consistent with Grade I diastolic dysfunction  (impaired relaxation). Elevated left ventricular end-diastolic pressure.  2. Right ventricular systolic function is normal. The right ventricular  size is normal. Tricuspid regurgitation signal is inadequate for assessing  PA pressure.  3. No evidence of mitral valve regurgitation. Moderate mitral annular  calcification of the posterior MV annulus.  4. The aortic valve is tricuspid. Moderately calcified non coronary cusp.  Aortic valve regurgitation is not visualized. Aortic valve mean gradient  measures 10.0 mmHg. Aortic valve peak gradient measures 14.5 mmHg. Aortic  valve area, by VTI measures 3.69  cm.  5. The inferior vena cava is normal in size with greater than 50%  respiratory variability, suggesting right atrial pressure of 3 mmHg.     CARDIAC CATH: 11/18/2018 1. Severe native vessel coronary artery disease with total occlusion of the RCA, total occlusion of left circumflex, and severe stenosis leading into total occlusion of the mid LAD 2. Status post multivessel coronary bypass surgery with continued patency of the saphenous vein graft RCA, sequential saphenous vein graft to OM1 and distal circumflex, and LIMA to diagonal and mid LAD 3. Severe stenosis involving the ostium/proximal portion of the SVG to distal RCA, treated successfully with a 3.5 x 20 mm Synergy DES utilizing distal embolic protection  Recommendations: Dual antiplatelet therapy with aspirin and clopidogrel 12 months as  tolerated Intervention    ASSESSMENT & PLAN:    1. CAD - remote CABG and then PCI from July of 2020 - he is one year out from the PCI - stopping Plavix today. Will leave on aspirin solely with all the falls. Hopefully his bruising will improve. We will leave him on his anti-anginal agents.   2. Persistent fatigue - most likely multifactorial.   3. HTN - BP is fine - he has had orthostasis as well. BP is ok here today - would stay on current regimen.   4. Tremor/falling/confusion - seeing Neurology next week - hopeful for some answers but also realistic that there may not be much to do.   5. Depression  6. Advancing age  Current medicines are reviewed with the patient today.  The patient does not have concerns regarding medicines other than what has been noted above.  The following changes have been made:  See above.  Labs/ tests ordered today include:   No orders of the defined types were placed in this encounter.   Disposition:   FU with me in 3 months.   Patient is agreeable to this plan and will call if any problems develop in the interim.   SignedTruitt Merle, NP  12/10/2019 3:12 PM  [facility] [address] [place], [place]  [address] Phone: [phone] Fax: [phone]

## 2019-12-05 NOTE — Telephone Encounter (Signed)
**Note De-Identified  Obfuscation** Message received through covermymeds: [place] Mol[place]a JR Key: [id number] Need help? Call us at [phone]  Outcome  Approved on June 9  Effective from 10/22/2019 through 10/21/2020.  Drug Ranolazine ER 500MG  er tablets  FormBlue Cross [place] Medicare [place] General Authorization Form

## 2019-12-09 ENCOUNTER — Ambulatory Visit: Payer: Medicare Other | Admitting: Nurse Practitioner

## 2019-12-10 ENCOUNTER — Encounter (INDEPENDENT_AMBULATORY_CARE_PROVIDER_SITE_OTHER): Payer: Self-pay

## 2019-12-10 ENCOUNTER — Encounter: Payer: Self-pay | Admitting: Nurse Practitioner

## 2019-12-10 ENCOUNTER — Ambulatory Visit (INDEPENDENT_AMBULATORY_CARE_PROVIDER_SITE_OTHER): Payer: Medicare Other | Admitting: Nurse Practitioner

## 2019-12-10 ENCOUNTER — Other Ambulatory Visit: Payer: Self-pay

## 2019-12-10 ENCOUNTER — Ambulatory Visit: Payer: Medicare Other | Admitting: Nurse Practitioner

## 2019-12-10 VITALS — BP 140/80 | HR 80 | Ht 72.0 in | Wt 164.4 lb

## 2019-12-10 DIAGNOSIS — I2510 Atherosclerotic heart disease of native coronary artery without angina pectoris: Secondary | ICD-10-CM

## 2019-12-10 DIAGNOSIS — R251 Tremor, unspecified: Secondary | ICD-10-CM | POA: Diagnosis not present

## 2019-12-10 DIAGNOSIS — E782 Mixed hyperlipidemia: Secondary | ICD-10-CM | POA: Diagnosis not present

## 2019-12-10 DIAGNOSIS — R200 Anesthesia of skin: Secondary | ICD-10-CM | POA: Diagnosis not present

## 2019-12-10 DIAGNOSIS — I10 Essential (primary) hypertension: Secondary | ICD-10-CM

## 2019-12-10 NOTE — Patient Instructions (Addendum)
After Visit Summary:  We will be checking the following labs today - NONE   Medication Instructions:    Continue with your current medicines.    If you need a refill on your cardiac medications before your next appointment, please call your pharmacy.     Testing/Procedures To Be Arranged:  N/A  Follow-Up:   See me in about 3 months    At [facility], you and your health needs are our priority.  As part of our continuing mission to provide you with exceptional heart care, we have created designated Provider Care Teams.  These Care Teams include your primary Cardiologist (physician) and Advanced Practice Providers (APPs -  Physician Assistants and Nurse Practitioners) who all work together to provide you with the care you need, when you need it.  Special Instructions:  . Stay safe, wash your hands for at least 20 seconds and wear a mask when needed.  . It was good to talk with you today.    Call the [place] office at [phone] if you have any questions, problems or concerns.

## 2019-12-17 ENCOUNTER — Encounter: Payer: Self-pay | Admitting: Neurology

## 2019-12-17 ENCOUNTER — Telehealth: Payer: Self-pay | Admitting: Nurse Practitioner

## 2019-12-17 ENCOUNTER — Ambulatory Visit (INDEPENDENT_AMBULATORY_CARE_PROVIDER_SITE_OTHER): Payer: Medicare Other | Admitting: Neurology

## 2019-12-17 ENCOUNTER — Other Ambulatory Visit: Payer: Self-pay

## 2019-12-17 VITALS — BP 122/60 | HR 59 | Ht 72.0 in | Wt 165.3 lb

## 2019-12-17 DIAGNOSIS — M21372 Foot drop, left foot: Secondary | ICD-10-CM | POA: Insufficient documentation

## 2019-12-17 DIAGNOSIS — R269 Unspecified abnormalities of gait and mobility: Secondary | ICD-10-CM | POA: Insufficient documentation

## 2019-12-17 DIAGNOSIS — G250 Essential tremor: Secondary | ICD-10-CM

## 2019-12-17 DIAGNOSIS — G603 Idiopathic progressive neuropathy: Secondary | ICD-10-CM | POA: Diagnosis not present

## 2019-12-17 DIAGNOSIS — I2510 Atherosclerotic heart disease of native coronary artery without angina pectoris: Secondary | ICD-10-CM | POA: Diagnosis not present

## 2019-12-17 DIAGNOSIS — E538 Deficiency of other specified B group vitamins: Secondary | ICD-10-CM

## 2019-12-17 DIAGNOSIS — M21371 Foot drop, right foot: Secondary | ICD-10-CM | POA: Diagnosis not present

## 2019-12-17 DIAGNOSIS — R413 Other amnesia: Secondary | ICD-10-CM

## 2019-12-17 HISTORY — DX: Foot drop, left foot: M21.372

## 2019-12-17 HISTORY — DX: Unspecified abnormalities of gait and mobility: R26.9

## 2019-12-17 HISTORY — DX: Essential tremor: G25.0

## 2019-12-17 HISTORY — DX: Other amnesia: R41.3

## 2019-12-17 HISTORY — DX: Foot drop, right foot: M21.371

## 2019-12-17 MED ORDER — CYANOCOBALAMIN 1000 MCG/ML IJ SOLN
1000.0000 ug | Freq: Once | INTRAMUSCULAR | Status: AC
  Administered 2019-12-17: 1000 ug via INTRAMUSCULAR

## 2019-12-17 MED ORDER — FLUDROCORTISONE ACETATE 0.1 MG PO TABS: 0.1000 mg | ORAL_TABLET | Freq: Every day | ORAL | 6 refills | Status: DC

## 2019-12-17 NOTE — Patient Instructions (Signed)
Reduce the gabapentin to 300 mg at night for 2 weeks, then stop

## 2019-12-17 NOTE — Progress Notes (Signed)
Reason for visit: Memory problems, gait disturbance, tremor  Referring physician: Dr. Shaw  Patrick [name] is a 86 y.o. male  History of present illness:  Patrick [name] is a 86 year old right-handed white male with a history of coronary artery disease and orthostatic hypotension.  The patient has had a long standing history of a peripheral neuropathy, this is associated with bilateral foot drops and severe gait disorder.  The patient has had increasing issues with falls over the last year, just within the last week or so, he has fallen 3 times.  He has a walker at home but refuses to use this on a regular basis.  Over the last 6 months, he has been noted to have a tremor that has affected the upper extremities bilaterally that is present when trying to perform activities that require fine motor control.  His handwriting and his ability to feed himself have been affected.  With standing, he may have some "bounciness" with the legs, he may at times seemed dazed and he will tend to collapse at the legs.  At one point he was treated with Florinef, but this has been discontinued after the losartan was stopped.  The patient has had some troubles with memory over the last 2 or 3 years, he has been on Aricept for this.  The patient has numbness in the legs, he denies any pain.  He recently was started on gabapentin.  The patient has a cold sensation in the feet, he also has some similar problems with the hands.  The patient has some problems with bladder control and constipation as well.  He has a history of vitamin B12 deficiency, he is on B12 injections.  He is sent to this office for further evaluation.  Past Medical History:  Diagnosis Date  . BPH (benign prostatic hypertrophy)   . Diverticulosis   . ED (erectile dysfunction)   . GERD (gastroesophageal reflux disease)   . Gout   . Hepatitis    hx hepatitis after mono as teenager  . History of kidney stones   . History of skin cancer   .  Hyperlipidemia   . Hypertension   . Hypothyroidism   . IHD (ischemic heart disease)    Remote CABG in 1997  . MI, acute, non ST segment elevation ([facility]) 2010   s/p cath with occluded SVG to PD that fills by collaterals and the remainder of his revasculsarization is satisfactory. "the first time they did the stent , the second time they did the graft 8 vessels"  . Nocturia   . Thrombocytopenia ([place]) 11/19/2018  . Urinary frequency     Past Surgical History:  Procedure Laterality Date  . APPENDECTOMY    . BALLOON DILATION N/A 03/25/2019   Procedure: BALLOON DILATION;  Surgeon: Otis Brace, MD;  Location: [facility];  Service: Gastroenterology;  Laterality: N/A;  . CARDIAC CATHETERIZATION  09/08/2008   NORMAL. EF 60%; Occluded SVG to PD that fills by collaterals and the remainder of his revascularization is satisfactory. he is managed medically.   . CORONARY ARTERY BYPASS GRAFT  1997   CABG x 8  . CORONARY STENT INTERVENTION N/A 11/18/2018   Procedure: CORONARY STENT INTERVENTION;  Surgeon: Sherren Mocha, MD;  Location: [facility];  Service: Cardiovascular;  Laterality: N/A;  . CYSTOSCOPY WITH INSERTION OF UROLIFT N/A 03/19/2015   Procedure: CYSTOSCOPY WITH INSERTION OF FOUR UROLIFTS;  Surgeon: Carolan Clines, MD;  Location: [facility];  Service: Urology;  Laterality: N/A;  .  ESOPHAGOGASTRODUODENOSCOPY (EGD) WITH PROPOFOL N/A 02/12/2017   Procedure: ESOPHAGOGASTRODUODENOSCOPY (EGD) WITH PROPOFOL;  Surgeon: Otis Brace, MD;  Location: [facility];  Service: Gastroenterology;  Laterality: N/A;  . ESOPHAGOGASTRODUODENOSCOPY (EGD) WITH PROPOFOL N/A 07/13/2018   Procedure: ESOPHAGOGASTRODUODENOSCOPY (EGD) WITH PROPOFOL;  Surgeon: Clarene Essex, MD;  Location: [facility];  Service: Endoscopy;  Laterality: N/A;  . ESOPHAGOGASTRODUODENOSCOPY (EGD) WITH PROPOFOL N/A 03/25/2019   Procedure: ESOPHAGOGASTRODUODENOSCOPY (EGD) WITH PROPOFOL;  Surgeon: Otis Brace, MD;  Location: [facility];  Service: Gastroenterology;  Laterality: N/A;  . FOREIGN BODY REMOVAL N/A 07/13/2018   Procedure: FOREIGN BODY REMOVAL;  Surgeon: Clarene Essex, MD;  Location: [facility];  Service: Endoscopy;  Laterality: N/A;  . LEFT HEART CATH AND CORS/GRAFTS ANGIOGRAPHY N/A 11/18/2018   Procedure: LEFT HEART CATH AND CORS/GRAFTS ANGIOGRAPHY;  Surgeon: Sherren Mocha, MD;  Location: [facility];  Service: Cardiovascular;  Laterality: N/A;    Family History  Problem Relation Age of Onset  . Heart failure Mother 53    Social history:  reports that he quit smoking about 61 years ago. His smoking use included cigarettes. He has a 15.00 pack-year smoking history. He has never used smokeless tobacco. He reports current alcohol use. He reports that he does not use drugs.  Medications:  Prior to Admission medications   Medication Sig Start Date End Date Taking? Authorizing Provider  allopurinol (ZYLOPRIM) 100 MG tablet Take 100 mg by mouth daily. 10/09/19  Yes [provider]  aspirin 81 MG tablet Take 81 mg by mouth daily.     Yes [provider]  donepezil (ARICEPT) 10 MG tablet Take 10 mg by mouth at bedtime.  02/13/18  Yes [provider]  finasteride (PROSCAR) 5 MG tablet Take 5 mg by mouth daily. 11/03/19  Yes [provider]  gabapentin (NEURONTIN) 300 MG capsule Take 300 mg by mouth 2 (two) times daily.   Yes [provider]  levothyroxine (SYNTHROID) 112 MCG tablet Take 112 mcg by mouth daily. 10/09/19  Yes [provider]  mirtazapine (REMERON) 15 MG tablet SMARTSIG:1 Pill By Mouth Every Night 06/22/19  Yes [provider]  nitroGLYCERIN (NITROSTAT) 0.4 MG SL tablet Place 1 tablet (0.4 mg total) under the tongue every 5 (five) minutes as needed. 07/03/19  Yes Burtis Junes, NP  NONFORMULARY OR COMPOUNDED ITEM Elderly male with impaired mobility, Benefits from outpatient PT per physical therapy. 02/28/19  Yes Dahal, Marlowe Aschoff, MD    pantoprazole (PROTONIX) 40 MG tablet Take 1 tablet (40 mg total) by mouth daily before breakfast. 03/25/19 03/24/20 Yes Brahmbhatt, Parag, MD  ranolazine (RANEXA) 500 MG 12 hr tablet Take 2 tablets (1,000 mg total) by mouth 2 (two) times daily. 10/08/19  Yes Burtis Junes, NP  sertraline (ZOLOFT) 100 MG tablet Take 100 mg by mouth daily. 09/19/19  Yes [provider]  VITAMIN D PO Take by mouth.   Yes [provider]  Vitamin D, Ergocalciferol, (DRISDOL) 50000 units CAPS capsule Take 50,000 Units by mouth once a week. 07/31/17  Yes [provider]  atorvastatin (LIPITOR) 20 MG tablet Take 1 tablet (20 mg total) by mouth daily. 02/25/18 10/08/19  Daune Perch, NP      Allergies  Allergen Reactions  . Halcion [Triazolam]     "Makes me crazy"  . Pravachol     Unknown    ROS:  Out of a complete 14 system review of symptoms, the patient complains only of the following symptoms, and all other reviewed systems  are negative.  Tremor Memory problems Balance problems  Blood pressure 122/60, pulse (!) 59, height 6' (1.829 m), weight 165 lb 5 oz (75 kg), SpO2 97 %.  Physical Exam  General: The patient is alert and cooperative at the time of the examination.  Eyes: Pupils are equal, round, and reactive to light. Discs are flat bilaterally.  Neck: The neck is supple, no carotid bruits are noted.  Respiratory: The respiratory examination is clear.  Cardiovascular: The cardiovascular examination reveals a regular rate and rhythm, no obvious murmurs or rubs are noted.  Skin: Extremities are without significant edema.  Neurologic Exam  Mental status: The patient is alert and oriented x 3 at the time of the examination. The Mini-Mental Status Examination done today shows a total score 24/30.  Cranial nerves: Facial symmetry is present. There is good sensation of the face to pinprick and soft touch bilaterally. The strength of the facial muscles and the muscles  to head turning and shoulder shrug are normal bilaterally. Speech is well enunciated, no aphasia or dysarthria is noted. Extraocular movements are full. Visual fields are full. The tongue is midline, and the patient has symmetric elevation of the soft palate. No obvious hearing deficits are noted.  Motor: The motor testing reveals 5 over 5 strength of all 4 extremities, with exception of bilateral foot drops noted. Good symmetric motor tone is noted throughout.  Sensory: Sensory testing is intact to pinprick, soft touch, vibration sensation, and position sense on the upper extremities.  With the lower extremities there is a stocking pattern pinprick sensory deficit up to the knees bilaterally.  Severe impairment of vibration sensation is noted, mild impairment of position sensation is seen.  No evidence of extinction is noted.  Coordination: Cerebellar testing reveals good finger-nose-finger and heel-to-shin bilaterally.  No significant tremor was seen with finger-nose-finger.  The patient does have some tremor however when trying to draw a spiral.  Gait and station: Gait is wide-based and unsteady.  The patient will slap his feet bilaterally with walking.  Tandem gait is very unsteady.  Romberg is positive.  Reflexes: Deep tendon reflexes are symmetric, but are depressed bilaterally. Toes are downgoing bilaterally.   Assessment/Plan:  1.  Severe peripheral neuropathy by clinical examination  2.  Gait disorder  3.  Memory disorder  4.  Tremor, possible essential tremor  5.  Orthostatic hypotension  6.  Bilateral foot drop  The patient has a severe gait disorder and is at risk for falls.  This is compounded by the fact that the patient is orthostatic at times, he may have episodes of zoning out and collapse of the legs increasing risk for falling as well.  The patient is refusing to use a walker, if indicated this may be necessary.  I will set him up for physical therapy for gait training.   I do not wish to place him on medications to treat the tremor at this point as the side effects may be worse than the benefit.  The patient will be taken off of gabapentin going to 300 mg at night for 1 week and then stop the medication.  He will follow-up here in 4 months.  We will follow the memory issues over time.  We will check blood work today.   Jill Alexanders MD 12/17/2019 10:47 AM  [facility] [address] [place], [place] [address]  Phone [phone] Fax [phone]

## 2019-12-17 NOTE — Telephone Encounter (Signed)
Received message on my personal phone from patient's wife - has seen neurology with work up in process.   Also noted return of orthostasis - will resume Florinef .1mg  daily.   She is to continue to monitor his BP for [place].   Burtis Junes, RN, [facility] [address] [place], [place]  [phone]

## 2019-12-22 ENCOUNTER — Telehealth: Payer: Self-pay | Admitting: Neurology

## 2019-12-22 LAB — RPR: RPR Ser Ql: NONREACTIVE

## 2019-12-22 LAB — MULTIPLE MYELOMA PANEL, SERUM
Albumin SerPl Elph-Mcnc: 4 g/dL (ref 2.9–4.4)
Albumin/Glob SerPl: 1.6 (ref 0.7–1.7)
Alpha 1: 0.3 g/dL (ref 0.0–0.4)
Alpha2 Glob SerPl Elph-Mcnc: 0.8 g/dL (ref 0.4–1.0)
B-Globulin SerPl Elph-Mcnc: 0.8 g/dL (ref 0.7–1.3)
Gamma Glob SerPl Elph-Mcnc: 0.7 g/dL (ref 0.4–1.8)
Globulin, Total: 2.6 g/dL (ref 2.2–3.9)
IgA/Immunoglobulin A, Serum: 104 mg/dL (ref 61–437)
IgG (Immunoglobin G), Serum: 747 mg/dL (ref 603–1613)
IgM (Immunoglobulin M), Srm: 28 mg/dL (ref 15–143)
Total Protein: 6.6 g/dL (ref 6.0–8.5)

## 2019-12-22 LAB — ANA W/REFLEX: Anti Nuclear Antibody (ANA): NEGATIVE

## 2019-12-22 LAB — ANGIOTENSIN CONVERTING ENZYME: Angio Convert Enzyme: 26 U/L (ref 14–82)

## 2019-12-22 LAB — B. BURGDORFI ANTIBODIES: Lyme IgG/IgM Ab: <0.91 {ISR} (ref 0.00–0.90)

## 2019-12-22 NOTE — Telephone Encounter (Signed)
Wife(on DPR) is asking for a call with the results to pt's blood work done on 08-04

## 2019-12-22 NOTE — Telephone Encounter (Signed)
I reached out to the pt's wife and advised the results of some of the blood tests are still pending.  Pt advised we would call once results are finalized. She verbalized understanding

## 2019-12-25 ENCOUNTER — Telehealth: Payer: Self-pay

## 2019-12-25 NOTE — Telephone Encounter (Signed)
I called the patient, left a message, I will try to call back later.

## 2019-12-25 NOTE — Telephone Encounter (Signed)
-----   Message from Kathrynn Ducking, MD sent at 12/23/2019 12:10 PM EDT -----  The blood work results are unremarkable. Please call the patient. ----- Message ----- From: Lavone Neri Lab Results In Sent: 12/18/2019   7:37 AM EDT To: Kathrynn Ducking, MD

## 2019-12-25 NOTE — Telephone Encounter (Signed)
Spoke to daughter, results given She states she is still concern about pt's numbness in hands and feet  Please advise

## 2019-12-26 NOTE — Telephone Encounter (Signed)
I called and talk with the daughter.  The blood work was unremarkable, no treatable causes of neuropathy was noted.  The management of the neuropathy will include safety measures such as using a cane or walker, walking slowly and deliberately, staying away from ladders, holding onto banister's when going up stairs.  Physical therapy can be helpful.  If he is having discomfort with neuropathy not just numbness, then medications can be added to help treat this otherwise I would not offer any medical therapy.

## 2019-12-30 ENCOUNTER — Ambulatory Visit: Payer: Medicare Other | Admitting: Physical Therapy

## 2020-01-07 ENCOUNTER — Ambulatory Visit: Payer: Medicare Other

## 2020-01-08 ENCOUNTER — Ambulatory Visit: Payer: Medicare Other

## 2020-01-08 DIAGNOSIS — M6281 Muscle weakness (generalized): Secondary | ICD-10-CM | POA: Diagnosis not present

## 2020-01-08 DIAGNOSIS — R2681 Unsteadiness on feet: Secondary | ICD-10-CM | POA: Diagnosis not present

## 2020-01-08 DIAGNOSIS — M545 Low back pain: Secondary | ICD-10-CM | POA: Diagnosis not present

## 2020-01-08 DIAGNOSIS — K5909 Other constipation: Secondary | ICD-10-CM | POA: Diagnosis not present

## 2020-01-08 DIAGNOSIS — F3341 Major depressive disorder, recurrent, in partial remission: Secondary | ICD-10-CM | POA: Diagnosis not present

## 2020-01-08 DIAGNOSIS — G629 Polyneuropathy, unspecified: Secondary | ICD-10-CM | POA: Diagnosis not present

## 2020-01-08 DIAGNOSIS — I951 Orthostatic hypotension: Secondary | ICD-10-CM | POA: Diagnosis not present

## 2020-01-08 DIAGNOSIS — I2510 Atherosclerotic heart disease of native coronary artery without angina pectoris: Secondary | ICD-10-CM | POA: Diagnosis not present

## 2020-01-09 ENCOUNTER — Encounter: Payer: Self-pay | Admitting: Physical Therapy

## 2020-01-09 ENCOUNTER — Other Ambulatory Visit: Payer: Self-pay

## 2020-01-09 ENCOUNTER — Ambulatory Visit: Payer: Medicare Other | Attending: Internal Medicine | Admitting: Physical Therapy

## 2020-01-09 DIAGNOSIS — M6281 Muscle weakness (generalized): Secondary | ICD-10-CM | POA: Insufficient documentation

## 2020-01-09 DIAGNOSIS — M21372 Foot drop, left foot: Secondary | ICD-10-CM | POA: Diagnosis not present

## 2020-01-09 DIAGNOSIS — R2681 Unsteadiness on feet: Secondary | ICD-10-CM | POA: Diagnosis not present

## 2020-01-09 DIAGNOSIS — R209 Unspecified disturbances of skin sensation: Secondary | ICD-10-CM | POA: Insufficient documentation

## 2020-01-09 DIAGNOSIS — R296 Repeated falls: Secondary | ICD-10-CM | POA: Diagnosis not present

## 2020-01-09 DIAGNOSIS — R208 Other disturbances of skin sensation: Secondary | ICD-10-CM

## 2020-01-09 DIAGNOSIS — R2689 Other abnormalities of gait and mobility: Secondary | ICD-10-CM | POA: Diagnosis not present

## 2020-01-09 DIAGNOSIS — M21371 Foot drop, right foot: Secondary | ICD-10-CM | POA: Diagnosis not present

## 2020-01-09 NOTE — Therapy (Signed)
[facility] [address] [place], [place], [address] Phone: [phone]   Fax:  [phone]  Physical Therapy Evaluation  Patient Details  Name: Patrick [name] Un MRN: [id number] Date of Birth: 08-11-1926 Referring Provider (PT): Kathrynn Ducking, MD   Encounter Date: 01/09/2020   PT End of Session - 01/09/20 1648    Visit Number 1    Number of Visits 17    Date for PT Re-Evaluation 03/09/20    Authorization Type Medicare and BCBS    Progress Note Due on Visit 10    PT Start Time 1435    PT Stop Time 1535    PT Time Calculation (min) 60 min    Equipment Utilized During Treatment Gait belt    Activity Tolerance Patient tolerated treatment well    Behavior During Therapy WFL for tasks assessed/performed           Past Medical History:  Diagnosis Date  . Bilateral foot-drop 12/17/2019  . BPH (benign prostatic hypertrophy)   . Diverticulosis   . ED (erectile dysfunction)   . Gait abnormality 12/17/2019  . GERD (gastroesophageal reflux disease)   . Gout   . Hepatitis    hx hepatitis after mono as teenager  . History of kidney stones   . History of skin cancer   . Hyperlipidemia   . Hypertension   . Hypothyroidism   . IHD (ischemic heart disease)    Remote CABG in 1997  . Memory disorder 12/17/2019  . MI, acute, non ST segment elevation ([place]) 2010   s/p cath with occluded SVG to PD that fills by collaterals and the remainder of his revasculsarization is satisfactory. "the first time they did the stent , the second time they did the graft 8 vessels"  . Nocturia   . Thrombocytopenia ([place]) 11/19/2018  . Tremor, essential 12/17/2019  . Urinary frequency     Past Surgical History:  Procedure Laterality Date  . APPENDECTOMY    . BALLOON DILATION N/A 03/25/2019   Procedure: BALLOON DILATION;  Surgeon: Otis Brace, MD;  Location: [facility];  Service: Gastroenterology;  Laterality: N/A;  . CARDIAC  CATHETERIZATION  09/08/2008   NORMAL. EF 60%; Occluded SVG to PD that fills by collaterals and the remainder of his revascularization is satisfactory. he is managed medically.   . CORONARY ARTERY BYPASS GRAFT  1997   CABG x 8  . CORONARY STENT INTERVENTION N/A 11/18/2018   Procedure: CORONARY STENT INTERVENTION;  Surgeon: Sherren Mocha, MD;  Location: [facility];  Service: Cardiovascular;  Laterality: N/A;  . CYSTOSCOPY WITH INSERTION OF UROLIFT N/A 03/19/2015   Procedure: CYSTOSCOPY WITH INSERTION OF FOUR UROLIFTS;  Surgeon: Carolan Clines, MD;  Location: [facility];  Service: Urology;  Laterality: N/A;  . ESOPHAGOGASTRODUODENOSCOPY (EGD) WITH PROPOFOL N/A 02/12/2017   Procedure: ESOPHAGOGASTRODUODENOSCOPY (EGD) WITH PROPOFOL;  Surgeon: Otis Brace, MD;  Location: [facility];  Service: Gastroenterology;  Laterality: N/A;  . ESOPHAGOGASTRODUODENOSCOPY (EGD) WITH PROPOFOL N/A 07/13/2018   Procedure: ESOPHAGOGASTRODUODENOSCOPY (EGD) WITH PROPOFOL;  Surgeon: Clarene Essex, MD;  Location: [facility];  Service: Endoscopy;  Laterality: N/A;  . ESOPHAGOGASTRODUODENOSCOPY (EGD) WITH PROPOFOL N/A 03/25/2019   Procedure: ESOPHAGOGASTRODUODENOSCOPY (EGD) WITH PROPOFOL;  Surgeon: Otis Brace, MD;  Location: [facility];  Service: Gastroenterology;  Laterality: N/A;  . FOREIGN BODY REMOVAL N/A 07/13/2018   Procedure: FOREIGN BODY REMOVAL;  Surgeon: Clarene Essex, MD;  Location: [facility];  Service: Endoscopy;  Laterality: N/A;  . LEFT HEART CATH AND  CORS/GRAFTS ANGIOGRAPHY N/A 11/18/2018   Procedure: LEFT HEART CATH AND CORS/GRAFTS ANGIOGRAPHY;  Surgeon: Sherren Mocha, MD;  Location: [facility];  Service: Cardiovascular;  Laterality: N/A;    There were no vitals filed for this visit.    Subjective Assessment - 01/09/20 1441    Subjective Pt reports his legs buckle and give out when he least expects it.  He also trips over objects and falls.  Patient has neuropathy but he feels  it is spreading up his legs.  Wife reports that patient's walking ability comes and goes.  Yesterday they had to use a wheelchair at the doctor's office because he couldn't stand up.  Pt reports he has sensation in his legs/feet and no pain but they just feel cold all the time.    Patient is accompained by: Family member    Pertinent History CAD, orthostatic hypotension, idiopathic neuropathy, bilat foot drop, falls, essential tremor, memory impairments, kidney stones, gout, skin CA, HLD, HTN, hypothyroidism, IHD with CABG in 1997, MI non ST segment elevation, thrombocytopenia    Limitations Walking    Patient Stated Goals To walk without slapping his feet and to be more stable    Currently in Pain? No/denies              [facility] PT Assessment - 01/09/20 1448      Assessment   Medical Diagnosis falls, gait abnormality    Referring Provider (PT) Kathrynn Ducking, MD    Onset Date/Surgical Date 12/17/19    Prior Therapy not for balance/falls      Precautions   Precautions Fall;Other (comment)    Precaution Comments CAD, orthostatic hypotension, idiopathic neuropathy, bilat foot drop, falls, essential tremor, memory impairments, kidney stones, gout, skin CA, HLD, HTN, hypothyroidism, IHD with CABG in 1997, MI non ST segment elevation, thrombocytopenia       Balance Screen   Has the patient fallen in the past 6 months Yes    How many times? >10    Has the patient had a decrease in activity level because of a fear of falling?  Yes   wife reports pt sits in [employer], occupational all day     [place] residence    Living Arrangements Spouse/significant other    Type of [place] to enter    Entrance Stairs-Number of Steps 3    Entrance Stairs-Rails Right    [place] level;Able to live on main level with bedroom/bathroom    [place] - 2 wheels;Grab bars - tub/shower    Additional Comments Going to breakfast with friends,  work outside in the yard but doesn't have any stamina - 15 minutes at the most; golf      Prior Function   Level of Independence Needs assistance with ADLs   assistance with buttons, eating, shaving due to tremors   Leisure Going to breakfast with friends, work outside in the yard but doesn't have any stamina - 15 minutes at the most; golf    Comments pt reports his main activity right now is watching TV all day; too fatigued to do any work outside      [employer] Status History of cognitive impairments - at baseline      Observation/Other Assessments   Focus on Therapeutic Outcomes (FOTO)  Not assessed      Sensation   Light Touch Impaired by gross assessment  Additional Comments decreased to light touch in lower leg,bilaterally      Coordination   Gross Motor Movements are Fluid and Coordinated No    Coordination and Movement Description essential tremors    Finger Nose Finger Test impaired due to essential tremors    Heel Shin Test [facility]      ROM / Strength   AROM / PROM / Strength Strength      Strength   Overall Strength Deficits    Overall Strength Comments hip flexion, knee flexion and extension 5/5.  Ankle DF 3/5, able to perform 30 heel raises with UE support but following heel raises pt experienced sudden knee buckling walking back to chair      Ambulation/Gait   Ambulation/Gait Yes    Ambulation/Gait Assistance 4: Min guard    Ambulation Distance (Feet) 230 Feet    Assistive device None    Gait Pattern Step-through pattern;Right foot flat;Left foot flat;Narrow base of support;Right flexed knee in stance;Left flexed knee in stance    Ambulation Surface Level;Indoor    Stairs Yes    Stairs Assistance 4: Min guard    Stair Management Technique Two rails;Alternating pattern    Number of Stairs 4    Height of Stairs 6      Standardized Balance Assessment   Standardized Balance Assessment Dynamic Gait Index;Five Times Sit to Stand    Five times sit  to stand comments  26 seconds without UE support but with therapist providing min-mod A to shift weight forwards to stand.  Unable to without assistance      Dynamic Gait Index   Level Surface Moderate Impairment    Change in Gait Speed Moderate Impairment    Gait with Horizontal Head Turns Moderate Impairment    Gait with Vertical Head Turns Severe Impairment    Gait and Pivot Turn Moderate Impairment    Step Over Obstacle Severe Impairment    Step Around Obstacles Mild Impairment    Steps Mild Impairment    Total Score 8    DGI comment: 8/24                      Objective measurements completed on examination: See above findings.               PT Education - 01/09/20 1648    Education Details possibility for aquatic therapy; clinical findings, PT POC and goals    Person(s) Educated Patient;Spouse    Methods Explanation    Comprehension Verbalized understanding            PT Short Term Goals - 01/09/20 1656      PT SHORT TERM GOAL #1   Title Pt will participate in assessment of safety and falls risk with various AD (cane vs. RW).    Time 4    Period Weeks    Status New    Target Date 02/08/20      PT SHORT TERM GOAL #2   Title Pt will initiate and demonstrate ability to perform LE strength and balance HEP with wife's supervision    Time 4    Period Weeks    Status New    Target Date 02/08/20      PT SHORT TERM GOAL #3   Title Pt will participate in assessment of various orthotics to reduce foot drop and improve stance phase stability    Time 4    Period Weeks    Status New  Target Date 02/08/20      PT SHORT TERM GOAL #4   Title Pt will decrease five time sit to stand to </= 20 seconds with use of UE and supervision    Baseline 26 seconds with therapist min-mod A    Time 4    Period Weeks    Status New    Target Date 02/08/20      PT SHORT TERM GOAL #5   Title Pt will increase DGI to >/= 12/24 to indicate decreased falls risk     Baseline 8/24    Time 4    Period Weeks    Status New    Target Date 02/08/20             PT Long Term Goals - 01/09/20 1702      PT LONG TERM GOAL #1   Title Pt will perform final HEP consistently with wife's supervision    Time 8    Period Weeks    Status New    Target Date 03/09/20      PT LONG TERM GOAL #2   Title Pt will perform five time sit <> stand with UE support in </= 15 seconds    Time 8    Period Weeks    Status New    Target Date 03/09/20      PT LONG TERM GOAL #3   Title Pt will increase DGI to >/= 16/24 with LRAD and appropriate orthosis to indicate decreased falls risk    Time 8    Period Weeks    Status New    Target Date 03/09/20      PT LONG TERM GOAL #4   Title Pt will ambulate 500' outside over paved and grassy terrain with appropriate orthosis and LRAD with supervision    Time 8    Period Weeks    Status New    Target Date 03/09/20      PT LONG TERM GOAL #5   Title Pt will report he has returned to breakfast with friends and safely entering/exiting restaurant with LRAD and supervision    Time 8    Period Weeks    Status New    Target Date 03/09/20                  Plan - 01/09/20 1649    Clinical Impression Statement Pt is a 86 year old male referred to Neuro OPPT for evaluation of bilat foot drop, gait abnormality and falls due to peripheral neuropathy.  Pt's PMH is significant for the following: CAD, orthostatic hypotension, idiopathic neuropathy, bilat foot drop, falls, essential tremor, memory impairments, kidney stones, gout, skin CA, HLD, HTN, hypothyroidism, IHD with CABG in 1997, MI non ST segment elevation, thrombocytopenia. The following deficits were noted during pt's exam: decreased activity tolerance/endurance, impaired LE sensation and coordination, impaired ankle strength with bilateral foot drop, impaired balance and balance reactions and abnormal gait.  Pt's five time sit to stand and DGI scores indicate pt is at high  risk for falls. Pt is resistant to using an assistive device right now and is experiencing falls almost daily.  Pt would benefit from skilled PT to address these impairments and functional limitations to maximize functional mobility independence and reduce falls risk.    Personal Factors and Comorbidities Age;Behavior Pattern;Comorbidity 3+;Fitness    Comorbidities CAD, orthostatic hypotension, idiopathic neuropathy, bilat foot drop, falls, essential tremor, memory impairments, kidney stones, gout, skin CA, HLD, HTN, hypothyroidism, IHD with  CABG in 1997, MI non ST segment elevation, thrombocytopenia    Examination-Activity Limitations Locomotion Level;Stairs;Stand    Examination-Participation Restrictions Community Activity;Yard Work    [employer] Evolving/Moderate complexity    Clinical Decision Making Moderate    Rehab Potential Fair    PT Frequency 2x / week    PT Duration 8 weeks    PT Treatment/Interventions ADLs/Self Care Home Management;Aquatic Therapy;Electrical Stimulation;DME Instruction;Gait training;Stair training;Functional mobility training;Therapeutic activities;Therapeutic exercise;Balance training;Neuromuscular re-education;Patient/family education;Orthotic Fit/Training;Energy conservation    PT Next Visit Plan ASSESS VITALS each visit.  Langley Gauss I think he would be a good candidate for the pool but he has a cardiac history and is hesitant to because it takes a lot of "work" to change clothes, shower afterwards, get a bathing suit...etc.  Maybe you could talk to him about the experience and see if he would reconsider?  Trial cane vs. walker - pt has 3 walkers at home (one is a rollator) but he is resistant to using.  Trial AFO - has bilat foot drop.  Initiate HEP for LE strength (mainly ankles), balance, endurance    Consulted and Agree with Plan of Care Patient;Family member/caregiver    Family Member Consulted Wife - [name]           Patient will benefit  from skilled therapeutic intervention in order to improve the following deficits and impairments:  Abnormal gait, Decreased activity tolerance, Cardiopulmonary status limiting activity, Decreased balance, Decreased coordination, Decreased endurance, Decreased strength, Difficulty walking, Impaired sensation  Visit Diagnosis: Repeated falls  Foot drop, left  Foot drop, right  Other disturbances of skin sensation  Muscle weakness (generalized)  Unsteadiness on feet  Other abnormalities of gait and mobility     Problem List Patient Active Problem List   Diagnosis Date Noted  . Memory disorder 12/17/2019  . Bilateral foot-drop 12/17/2019  . Gait abnormality 12/17/2019  . Tremor, essential 12/17/2019  . AKI (acute kidney injury) ([place]) 06/23/2019  . Chest pain, rule out acute myocardial infarction 06/23/2019  . Diverticulitis of intestine with perforation and abscess without bleeding 02/26/2019  . Hypokalemia 02/26/2019  . Occult blood positive stool 02/26/2019  . Acute diverticulitis 02/19/2019  . Thrombocytopenia ([place]) 11/19/2018  . Unstable angina ([place]) 11/14/2018  . Essential (primary) hypertension 02/25/2018  . Claudication ([place]) 04/21/2016  . History of cold sores 03/22/2011  . Coronary artery disease involving native coronary artery of native heart without angina pectoris 10/28/2010  . Hyperlipemia 10/28/2010  . Hypothyroidism 10/28/2010   Rico Junker, PT, DPT 01/09/20    5:08 PM    [facility] [address] [place], [place], [address] Phone: [phone]   Fax:  [phone]  Name: Patrick [name] Shutt MRN: [id number] Date of Birth: 1926/07/08

## 2020-01-14 ENCOUNTER — Encounter: Payer: Self-pay | Admitting: Physical Therapy

## 2020-01-14 ENCOUNTER — Other Ambulatory Visit: Payer: Self-pay

## 2020-01-14 ENCOUNTER — Ambulatory Visit: Payer: Medicare Other | Attending: Internal Medicine | Admitting: Physical Therapy

## 2020-01-14 VITALS — BP 174/75 | HR 74

## 2020-01-14 DIAGNOSIS — R209 Unspecified disturbances of skin sensation: Secondary | ICD-10-CM | POA: Diagnosis not present

## 2020-01-14 DIAGNOSIS — R2689 Other abnormalities of gait and mobility: Secondary | ICD-10-CM | POA: Insufficient documentation

## 2020-01-14 DIAGNOSIS — M21371 Foot drop, right foot: Secondary | ICD-10-CM | POA: Diagnosis not present

## 2020-01-14 DIAGNOSIS — M6281 Muscle weakness (generalized): Secondary | ICD-10-CM | POA: Insufficient documentation

## 2020-01-14 DIAGNOSIS — M21372 Foot drop, left foot: Secondary | ICD-10-CM | POA: Insufficient documentation

## 2020-01-14 DIAGNOSIS — R296 Repeated falls: Secondary | ICD-10-CM | POA: Insufficient documentation

## 2020-01-14 DIAGNOSIS — R2681 Unsteadiness on feet: Secondary | ICD-10-CM

## 2020-01-14 DIAGNOSIS — R208 Other disturbances of skin sensation: Secondary | ICD-10-CM

## 2020-01-14 NOTE — Therapy (Signed)
[facility] [address] [place], [place], [address] Phone: [phone]   Fax:  [phone]  Physical Therapy Treatment  Patient Details  Name: Patrick [name] MRN: [id number] Date of Birth: 1926/09/11 Referring Provider (PT): Kathrynn Ducking, MD   Encounter Date: 01/14/2020   PT End of Session - 01/14/20 1553    Visit Number 2    Number of Visits 17    Date for PT Re-Evaluation 03/09/20    Authorization Type Medicare and BCBS    Progress Note Due on Visit 10    PT Start Time 3151    PT Stop Time 1530    PT Time Calculation (min) 45 min    Equipment Utilized During Treatment Gait belt    Activity Tolerance Patient tolerated treatment well;Other (comment)   needed several rest breaks   Behavior During Therapy WFL for tasks assessed/performed           Past Medical History:  Diagnosis Date  . Bilateral foot-drop 12/17/2019  . BPH (benign prostatic hypertrophy)   . Diverticulosis   . ED (erectile dysfunction)   . Gait abnormality 12/17/2019  . GERD (gastroesophageal reflux disease)   . Gout   . Hepatitis    hx hepatitis after mono as teenager  . History of kidney stones   . History of skin cancer   . Hyperlipidemia   . Hypertension   . Hypothyroidism   . IHD (ischemic heart disease)    Remote CABG in 1997  . Memory disorder 12/17/2019  . MI, acute, non ST segment elevation ([facility]) 2010   s/p cath with occluded SVG to PD that fills by collaterals and the remainder of his revasculsarization is satisfactory. "the first time they did the stent , the second time they did the graft 8 vessels"  . Nocturia   . Thrombocytopenia ([facility]) 11/19/2018  . Tremor, essential 12/17/2019  . Urinary frequency     Past Surgical History:  Procedure Laterality Date  . APPENDECTOMY    . BALLOON DILATION N/A 03/25/2019   Procedure: BALLOON DILATION;  Surgeon: Otis Brace, MD;  [facility];  Service: Gastroenterology;   Laterality: N/A;  . CARDIAC CATHETERIZATION  09/08/2008   NORMAL. EF 60%; Occluded SVG to PD that fills by collaterals and the remainder of his revascularization is satisfactory. he is managed medically.   . CORONARY ARTERY BYPASS GRAFT  1997   CABG x 8  . CORONARY STENT INTERVENTION N/A 11/18/2018   Procedure: CORONARY STENT INTERVENTION;  Surgeon: Sherren Mocha, MD;  Location: [facility];  Service: Cardiovascular;  Laterality: N/A;  . CYSTOSCOPY WITH INSERTION OF UROLIFT N/A 03/19/2015   Procedure: CYSTOSCOPY WITH INSERTION OF FOUR UROLIFTS;  Surgeon: Carolan Clines, MD;  [facility];  Service: Urology;  Laterality: N/A;  . ESOPHAGOGASTRODUODENOSCOPY (EGD) WITH PROPOFOL N/A 02/12/2017   Procedure: ESOPHAGOGASTRODUODENOSCOPY (EGD) WITH PROPOFOL;  Surgeon: Otis Brace, MD;  [facility];  Service: Gastroenterology;  Laterality: N/A;  . ESOPHAGOGASTRODUODENOSCOPY (EGD) WITH PROPOFOL N/A 07/13/2018   Procedure: ESOPHAGOGASTRODUODENOSCOPY (EGD) WITH PROPOFOL;  Surgeon: Clarene Essex, MD;  [facility];  Service: Endoscopy;  Laterality: N/A;  . ESOPHAGOGASTRODUODENOSCOPY (EGD) WITH PROPOFOL N/A 03/25/2019   Procedure: ESOPHAGOGASTRODUODENOSCOPY (EGD) WITH PROPOFOL;  Surgeon: Otis Brace, MD;  [facility];  Service: Gastroenterology;  Laterality: N/A;  . FOREIGN BODY REMOVAL N/A 07/13/2018   Procedure: FOREIGN BODY REMOVAL;  Surgeon: Clarene Essex, MD;  [facility];  Service: Endoscopy;  Laterality: N/A;  .  LEFT HEART CATH AND CORS/GRAFTS ANGIOGRAPHY N/A 11/18/2018   Procedure: LEFT HEART CATH AND CORS/GRAFTS ANGIOGRAPHY;  Surgeon: Sherren Mocha, MD;  Location: [facility];  Service: Cardiovascular;  Laterality: N/A;    Vitals:   01/14/20 1452 01/14/20 1506 01/14/20 1516  BP: (!) 144/64 (!) 185/73 (!) 174/75  Pulse: 66 69 74     Subjective Assessment - 01/14/20 1449    Subjective Pt denies any falls since last visit.  "I get  really dizzy when I close my eyes.  I notice it when I was my hair."    Patient is accompained by: Family member    Pertinent History CAD, orthostatic hypotension, idiopathic neuropathy, bilat foot drop, falls, essential tremor, memory impairments, kidney stones, gout, skin CA, HLD, HTN, hypothyroidism, IHD with CABG in 1997, MI non ST segment elevation, thrombocytopenia    Limitations Walking    Patient Stated Goals To walk without slapping his feet and to be more stable    Currently in Pain? No/denies                             [facility] Adult PT Treatment/Exercise - 01/14/20 0001      Transfers   Transfers Sit to Stand;Stand to Sit    Sit to Stand 5: Supervision;4: Min guard    Sit to Stand Details (indicate cue type and reason) needs repeated attempts to achieve standing    Stand to Sit 5: Supervision;4: Min guard    Number of Reps 10 reps    Comments Pt with DOE 3/3 after 10x sit<>stand      Ambulation/Gait   Ambulation/Gait Yes    Ambulation/Gait Assistance 4: Min guard    Ambulation Distance (Feet) 220 Feet   x 1, 330 x 1 and 110 x 2   Assistive device Rolling walker;None    Gait Pattern Step-through pattern;Right foot flat;Left foot flat;Narrow base of support;Right flexed knee in stance;Left flexed knee in stance    Ambulation Surface Level;Indoor    Gait Comments Pt with frequent LOB with gait without RW with narrow BOS and reaching out to objects.  Balance greatly improved with RW.  Worked on heel strike and control to prevent "slap" of feet with gait.  Pt able to improve control slightly.  Pt hesistant to try AFO.      Exercises   Exercises Knee/Hip;Ankle      Knee/Hip Exercises: Aerobic   Other Aerobic Scifit level 1.5 bil LE's only x 5 minutes      Ankle Exercises: Seated   Toe Raise 10 reps;Other (comment)   with red theraband   Other Seated Ankle Exercises resisted plantar flexion with green band x 10 bil LE's                  PT  Education - 01/14/20 1551    Education Details recommendation to use RW for safety, vitals during session and wife to check again at home    Person(s) Educated Patient;Spouse    Methods Explanation;Demonstration    Comprehension Verbalized understanding;Other (comment)   unsure if pt will use as recommended           PT Short Term Goals - 01/09/20 1656      PT SHORT TERM GOAL #1   Title Pt will participate in assessment of safety and falls risk with various AD (cane vs. RW).    Time 4    Period Weeks  Status New    Target Date 02/08/20      PT SHORT TERM GOAL #2   Title Pt will initiate and demonstrate ability to perform LE strength and balance HEP with wife's supervision    Time 4    Period Weeks    Status New    Target Date 02/08/20      PT SHORT TERM GOAL #3   Title Pt will participate in assessment of various orthotics to reduce foot drop and improve stance phase stability    Time 4    Period Weeks    Status New    Target Date 02/08/20      PT SHORT TERM GOAL #4   Title Pt will decrease five time sit to stand to </= 20 seconds with use of UE and supervision    Baseline 26 seconds with therapist min-mod A    Time 4    Period Weeks    Status New    Target Date 02/08/20      PT SHORT TERM GOAL #5   Title Pt will increase DGI to >/= 12/24 to indicate decreased falls risk    Baseline 8/24    Time 4    Period Weeks    Status New    Target Date 02/08/20             PT Long Term Goals - 01/09/20 1702      PT LONG TERM GOAL #1   Title Pt will perform final HEP consistently with wife's supervision    Time 8    Period Weeks    Status New    Target Date 03/09/20      PT LONG TERM GOAL #2   Title Pt will perform five time sit <> stand with UE support in </= 15 seconds    Time 8    Period Weeks    Status New    Target Date 03/09/20      PT LONG TERM GOAL #3   Title Pt will increase DGI to >/= 16/24 with LRAD and appropriate orthosis to indicate decreased  falls risk    Time 8    Period Weeks    Status New    Target Date 03/09/20      PT LONG TERM GOAL #4   Title Pt will ambulate 500' outside over paved and grassy terrain with appropriate orthosis and LRAD with supervision    Time 8    Period Weeks    Status New    Target Date 03/09/20      PT LONG TERM GOAL #5   Title Pt will report he has returned to breakfast with friends and safely entering/exiting restaurant with LRAD and supervision    Time 8    Period Weeks    Status New    Target Date 03/09/20                 Plan - 01/14/20 1554    Clinical Impression Statement Pt hesitant to use assistive device and did not want to try AFO's.  Pt able to slightly improve heel strike and control with gait with RW but still present.  Pt reports decreased activity/endurance at home.  Cont PT per poc.    Personal Factors and Comorbidities Age;Behavior Pattern;Comorbidity 3+;Fitness    Comorbidities CAD, orthostatic hypotension, idiopathic neuropathy, bilat foot drop, falls, essential tremor, memory impairments, kidney stones, gout, skin CA, HLD, HTN, hypothyroidism, IHD with CABG in 1997, MI non ST segment  elevation, thrombocytopenia    Examination-Activity Limitations Locomotion Level;Stairs;Stand    Examination-Participation Restrictions Community Activity;Yard Work    Merchant [employer] officer Evolving/Moderate complexity    Rehab Potential Fair    PT Frequency 2x / week    PT Duration 8 weeks    PT Treatment/Interventions ADLs/Self Care Home Management;Aquatic Therapy;Electrical Stimulation;DME Instruction;Gait training;Stair training;Functional mobility training;Therapeutic activities;Therapeutic exercise;Balance training;Neuromuscular re-education;Patient/family education;Orthotic Fit/Training;Energy conservation    PT Next Visit Plan ASSESS VITALS each visit. Discuss aquatics ?good candidate for the pool but he has a cardiac history and is hesitant to because it takes a lot  of "work" to change clothes, shower afterwards, get a bathing suit...etc.  Cont to encourage AD for safety  Trial AFO - has bilat foot drop.  Initiate HEP for LE strength (mainly ankles), balance, endurance    Consulted and Agree with Plan of Care Patient;Family member/caregiver    Family Member Consulted Wife - [name]           Patient will benefit from skilled therapeutic intervention in order to improve the following deficits and impairments:  Abnormal gait, Decreased activity tolerance, Cardiopulmonary status limiting activity, Decreased balance, Decreased coordination, Decreased endurance, Decreased strength, Difficulty walking, Impaired sensation  Visit Diagnosis: Repeated falls  Foot drop, left  Foot drop, right  Other disturbances of skin sensation  Muscle weakness (generalized)  Unsteadiness on feet  Other abnormalities of gait and mobility     Problem List Patient Active Problem List   Diagnosis Date Noted  . Memory disorder 12/17/2019  . Bilateral foot-drop 12/17/2019  . Gait abnormality 12/17/2019  . Tremor, essential 12/17/2019  . AKI (acute kidney injury) ([facility]) 06/23/2019  . Chest pain, rule out acute myocardial infarction 06/23/2019  . Diverticulitis of intestine with perforation and abscess without bleeding 02/26/2019  . Hypokalemia 02/26/2019  . Occult blood positive stool 02/26/2019  . Acute diverticulitis 02/19/2019  . Thrombocytopenia ([facility]) 11/19/2018  . Unstable angina ([facility]) 11/14/2018  . Essential (primary) hypertension 02/25/2018  . Claudication ([facility]) 04/21/2016  . History of cold sores 03/22/2011  . Coronary artery disease involving native coronary artery of native heart without angina pectoris 10/28/2010  . Hyperlipemia 10/28/2010  . Hypothyroidism 10/28/2010    Narda Bonds, PTA [facility] 01/14/20 3:58 PM Phone: [phone] Fax: [facility] [address] [place] [employer] [place], [place], [address] Phone: [phone]   Fax:  [phone]  Name: Patrick [name] MRN: [id number] Date of Birth: [birth year]

## 2020-01-15 ENCOUNTER — Encounter: Payer: Self-pay | Admitting: Physical Therapy

## 2020-01-15 ENCOUNTER — Ambulatory Visit: Payer: Medicare Other | Admitting: Physical Therapy

## 2020-01-15 VITALS — BP 155/66 | HR 85

## 2020-01-15 DIAGNOSIS — R2689 Other abnormalities of gait and mobility: Secondary | ICD-10-CM

## 2020-01-15 DIAGNOSIS — M6281 Muscle weakness (generalized): Secondary | ICD-10-CM

## 2020-01-15 DIAGNOSIS — R296 Repeated falls: Secondary | ICD-10-CM

## 2020-01-15 DIAGNOSIS — R2681 Unsteadiness on feet: Secondary | ICD-10-CM | POA: Diagnosis not present

## 2020-01-15 DIAGNOSIS — M21371 Foot drop, right foot: Secondary | ICD-10-CM | POA: Diagnosis not present

## 2020-01-15 DIAGNOSIS — R209 Unspecified disturbances of skin sensation: Secondary | ICD-10-CM | POA: Diagnosis not present

## 2020-01-15 DIAGNOSIS — M21372 Foot drop, left foot: Secondary | ICD-10-CM

## 2020-01-15 DIAGNOSIS — R208 Other disturbances of skin sensation: Secondary | ICD-10-CM

## 2020-01-15 NOTE — Therapy (Signed)
[facility] [address] [place], [place], [address] Phone: [phone]   Fax:  [phone]  Physical Therapy Treatment  Patient Details  Name: Patrick [name] MRN: [id number] Date of Birth: 08-01-1926 Referring Provider (PT): Kathrynn Ducking, MD   Encounter Date: 01/15/2020   PT End of Session - 01/15/20 1551    Visit Number 3    Number of Visits 17    Date for PT Re-Evaluation 03/09/20    Authorization Type Medicare and BCBS    Progress Note Due on Visit 10    PT Start Time 1779    PT Stop Time 1532    PT Time Calculation (min) 47 min    Equipment Utilized During Treatment Gait belt    Activity Tolerance Patient tolerated treatment well;Other (comment)   needed several rest breaks   Behavior During Therapy WFL for tasks assessed/performed           Past Medical History:  Diagnosis Date  . Bilateral foot-drop 12/17/2019  . BPH (benign prostatic hypertrophy)   . Diverticulosis   . ED (erectile dysfunction)   . Gait abnormality 12/17/2019  . GERD (gastroesophageal reflux disease)   . Gout   . Hepatitis    hx hepatitis after mono as teenager  . History of kidney stones   . History of skin cancer   . Hyperlipidemia   . Hypertension   . Hypothyroidism   . IHD (ischemic heart disease)    Remote CABG in 1997  . Memory disorder 12/17/2019  . MI, acute, non ST segment elevation ([facility]) 2010   s/p cath with occluded SVG to PD that fills by collaterals and the remainder of his revasculsarization is satisfactory. "the first time they did the stent , the second time they did the graft 8 vessels"  . Nocturia   . Thrombocytopenia ([facility]) 11/19/2018  . Tremor, essential 12/17/2019  . Urinary frequency     Past Surgical History:  Procedure Laterality Date  . APPENDECTOMY    . BALLOON DILATION N/A 03/25/2019   Procedure: BALLOON DILATION;  Surgeon: Otis Brace, MD;  [facility];  Service: Gastroenterology;   Laterality: N/A;  . CARDIAC CATHETERIZATION  09/08/2008   NORMAL. EF 60%; Occluded SVG to PD that fills by collaterals and the remainder of his revascularization is satisfactory. he is managed medically.   . CORONARY ARTERY BYPASS GRAFT  1997   CABG x 8  . CORONARY STENT INTERVENTION N/A 11/18/2018   Procedure: CORONARY STENT INTERVENTION;  Surgeon: Sherren Mocha, MD;  Location: [facility];  Service: Cardiovascular;  Laterality: N/A;  . CYSTOSCOPY WITH INSERTION OF UROLIFT N/A 03/19/2015   Procedure: CYSTOSCOPY WITH INSERTION OF FOUR UROLIFTS;  Surgeon: Carolan Clines, MD;  [facility];  Service: Urology;  Laterality: N/A;  . ESOPHAGOGASTRODUODENOSCOPY (EGD) WITH PROPOFOL N/A 02/12/2017   Procedure: ESOPHAGOGASTRODUODENOSCOPY (EGD) WITH PROPOFOL;  Surgeon: Otis Brace, MD;  [facility];  Service: Gastroenterology;  Laterality: N/A;  . ESOPHAGOGASTRODUODENOSCOPY (EGD) WITH PROPOFOL N/A 07/13/2018   Procedure: ESOPHAGOGASTRODUODENOSCOPY (EGD) WITH PROPOFOL;  Surgeon: Clarene Essex, MD;  [facility];  Service: Endoscopy;  Laterality: N/A;  . ESOPHAGOGASTRODUODENOSCOPY (EGD) WITH PROPOFOL N/A 03/25/2019   Procedure: ESOPHAGOGASTRODUODENOSCOPY (EGD) WITH PROPOFOL;  Surgeon: Otis Brace, MD;  [facility];  Service: Gastroenterology;  Laterality: N/A;  . FOREIGN BODY REMOVAL N/A 07/13/2018   Procedure: FOREIGN BODY REMOVAL;  Surgeon: Clarene Essex, MD;  [facility];  Service: Endoscopy;  Laterality: N/A;  .  LEFT HEART CATH AND CORS/GRAFTS ANGIOGRAPHY N/A 11/18/2018   Procedure: LEFT HEART CATH AND CORS/GRAFTS ANGIOGRAPHY;  Surgeon: Sherren Mocha, MD;  Location: [facility];  Service: Cardiovascular;  Laterality: N/A;    Vitals:   01/15/20 1451 01/15/20 1457  BP: (!) 148/67 (!) 155/66  Pulse: 82 85     Subjective Assessment - 01/15/20 1453    Subjective "You cured me yesterday.  Look how good I am walking."  Pt reports  feeling better after yesterdays session.  LE's a little sore from exercise.    Patient is accompained by: Family member    Pertinent History CAD, orthostatic hypotension, idiopathic neuropathy, bilat foot drop, falls, essential tremor, memory impairments, kidney stones, gout, skin CA, HLD, HTN, hypothyroidism, IHD with CABG in 1997, MI non ST segment elevation, thrombocytopenia    Limitations Walking    Patient Stated Goals To walk without slapping his feet and to be more stable    Currently in Pain? No/denies                             [facility] Adult PT Treatment/Exercise - 01/15/20 0001      Transfers   Transfers Sit to Stand;Stand to Sit    Sit to Stand 5: Supervision    Stand to Sit 5: Supervision    Number of Reps --   numerous times for activities     Ambulation/Gait   Ambulation/Gait Yes    Ambulation/Gait Assistance 4: Min guard;5: Supervision    Ambulation Distance (Feet) 300 Feet   x 3 and 100' x 4 plus around gym for activities   Assistive device None;Other (Comment)    Gait Pattern Step-through pattern;Right foot flat;Left foot flat;Narrow base of support;Right flexed knee in stance;Left flexed knee in stance    Ambulation Surface Level;Indoor    Gait Comments Trial of bil Walk on AFO's.  Pt with decreased foot slap and improved balance with gait (did not perform high level balance with AFO's).  Pt agreed to try AFO's but rather adamant that he would not wear them at home.  Pt feels like the foot slap is not impacting his gait other than being able to hear his foot slap.  Pt continues with decreased BOS with gait and standing.  Pt with 1 bout of significant lob when turning that required min-mod assist to recover. Pt also with trunk forward flexion and improves with cues but quickly reverts back.       High Level Balance   High Level Balance Activities Other (comment)    High Level Balance Comments At counter performing SLS x 10 sec x 3, Marching x 10, marching  with forward walk 8' x 2, hip abd x 10.  Backward walking along counter 8' x 4 reps.  Pt required 1-2 UE support for all activities at counter to maintain balance.  Needed min assist to recover LOB with backwards amb.        Exercises   Exercises Knee/Hip      Knee/Hip Exercises: Aerobic   Other Aerobic Scifit level 1.5 all 4 extremties x 8 minutes with rpm>80 and maintained >90 for last several minutes.  Has to stop at times to readjust L foot onto foot plate as it slides off                  PT Education - 01/15/20 1550    Education Details Purpose of AFO's, continued recommendation  to use assistive device for safety    Person(s) Educated Patient    Methods Explanation    Comprehension Verbalized understanding            PT Short Term Goals - 01/09/20 1656      PT SHORT TERM GOAL #1   Title Pt will participate in assessment of safety and falls risk with various AD (cane vs. RW).    Time 4    Period Weeks    Status New    Target Date 02/08/20      PT SHORT TERM GOAL #2   Title Pt will initiate and demonstrate ability to perform LE strength and balance HEP with wife's supervision    Time 4    Period Weeks    Status New    Target Date 02/08/20      PT SHORT TERM GOAL #3   Title Pt will participate in assessment of various orthotics to reduce foot drop and improve stance phase stability    Time 4    Period Weeks    Status New    Target Date 02/08/20      PT SHORT TERM GOAL #4   Title Pt will decrease five time sit to stand to </= 20 seconds with use of UE and supervision    Baseline 26 seconds with therapist min-mod A    Time 4    Period Weeks    Status New    Target Date 02/08/20      PT SHORT TERM GOAL #5   Title Pt will increase DGI to >/= 12/24 to indicate decreased falls risk    Baseline 8/24    Time 4    Period Weeks    Status New    Target Date 02/08/20             PT Long Term Goals - 01/09/20 1702      PT LONG TERM GOAL #1   Title Pt  will perform final HEP consistently with wife's supervision    Time 8    Period Weeks    Status New    Target Date 03/09/20      PT LONG TERM GOAL #2   Title Pt will perform five time sit <> stand with UE support in </= 15 seconds    Time 8    Period Weeks    Status New    Target Date 03/09/20      PT LONG TERM GOAL #3   Title Pt will increase DGI to >/= 16/24 with LRAD and appropriate orthosis to indicate decreased falls risk    Time 8    Period Weeks    Status New    Target Date 03/09/20      PT LONG TERM GOAL #4   Title Pt will ambulate 500' outside over paved and grassy terrain with appropriate orthosis and LRAD with supervision    Time 8    Period Weeks    Status New    Target Date 03/09/20      PT LONG TERM GOAL #5   Title Pt will report he has returned to breakfast with friends and safely entering/exiting restaurant with LRAD and supervision    Time 8    Period Weeks    Status New    Target Date 03/09/20                 Plan - 01/15/20 1551    Clinical Impression Statement Pt agreed to  try bil AFO's today with gait and improved gait with them but states he would not wear them.  Continues with decreased balance and risk for falls.  Cont per poc.    Personal Factors and Comorbidities Age;Behavior Pattern;Comorbidity 3+;Fitness    Comorbidities CAD, orthostatic hypotension, idiopathic neuropathy, bilat foot drop, falls, essential tremor, memory impairments, kidney stones, gout, skin CA, HLD, HTN, hypothyroidism, IHD with CABG in 1997, MI non ST segment elevation, thrombocytopenia    Examination-Activity Limitations Locomotion Level;Stairs;Stand    Examination-Participation Restrictions Community Activity;Yard Work    Merchant navy officer Evolving/Moderate complexity    Rehab Potential Fair    PT Frequency 2x / week    PT Duration 8 weeks    PT Treatment/Interventions ADLs/Self Care Home Management;Aquatic Therapy;Electrical Stimulation;DME  Instruction;Gait training;Stair training;Functional mobility training;Therapeutic activities;Therapeutic exercise;Balance training;Neuromuscular re-education;Patient/family education;Orthotic Fit/Training;Energy conservation    PT Next Visit Plan ASSESS VITALS each visit. Scifit for endurance.  Cont to encourage AD for safety  Initiate HEP for LE strength (mainly ankles), balance, endurance    Consulted and Agree with Plan of Care Patient;Family member/caregiver    Family Member Consulted Wife - [name]           Patient will benefit from skilled therapeutic intervention in order to improve the following deficits and impairments:  Abnormal gait, Decreased activity tolerance, Cardiopulmonary status limiting activity, Decreased balance, Decreased coordination, Decreased endurance, Decreased strength, Difficulty walking, Impaired sensation  Visit Diagnosis: Repeated falls  Foot drop, left  Foot drop, right  Other disturbances of skin sensation  Muscle weakness (generalized)  Unsteadiness on feet  Other abnormalities of gait and mobility     Problem List Patient Active Problem List   Diagnosis Date Noted  . Memory disorder 12/17/2019  . Bilateral foot-drop 12/17/2019  . Gait abnormality 12/17/2019  . Tremor, essential 12/17/2019  . AKI (acute kidney injury) ([facility]) 06/23/2019  . Chest pain, rule out acute myocardial infarction 06/23/2019  . Diverticulitis of intestine with perforation and abscess without bleeding 02/26/2019  . Hypokalemia 02/26/2019  . Occult blood positive stool 02/26/2019  . Acute diverticulitis 02/19/2019  . Thrombocytopenia ([facility]) 11/19/2018  . Unstable angina ([facility]) 11/14/2018  . Essential (primary) hypertension 02/25/2018  . Claudication ([facility]) 04/21/2016  . History of cold sores 03/22/2011  . Coronary artery disease involving native coronary artery of native heart without angina pectoris 10/28/2010  . Hyperlipemia 10/28/2010  . Hypothyroidism 10/28/2010     Narda Bonds, PTA [facility] 01/15/20 3:56 PM Phone: [phone] Fax: [facility] [address] [place], [place], [address] Phone: [phone]   Fax:  [phone]  Name: Patrick Rica H Volden MRN: [id number] Date of Birth: 1927/01/15

## 2020-01-26 ENCOUNTER — Telehealth: Payer: Self-pay | Admitting: Nurse Practitioner

## 2020-01-26 NOTE — Telephone Encounter (Signed)
[name] reached out to me while I was out of the office last week regarding BP concerns. BP running high. He continues to feel poorly.   We have opted to use [place] only if AM BP is less than 771 systolic to try to minimize the really high readings.   Burtis Junes, RN, [facility] [address] [place], [place]  [phone]

## 2020-01-30 ENCOUNTER — Encounter: Payer: Self-pay | Admitting: Physical Therapy

## 2020-01-30 ENCOUNTER — Other Ambulatory Visit: Payer: Self-pay

## 2020-01-30 ENCOUNTER — Ambulatory Visit: Payer: Medicare Other | Admitting: Physical Therapy

## 2020-01-30 DIAGNOSIS — R2681 Unsteadiness on feet: Secondary | ICD-10-CM

## 2020-01-30 DIAGNOSIS — R209 Unspecified disturbances of skin sensation: Secondary | ICD-10-CM | POA: Diagnosis not present

## 2020-01-30 DIAGNOSIS — M21372 Foot drop, left foot: Secondary | ICD-10-CM

## 2020-01-30 DIAGNOSIS — R296 Repeated falls: Secondary | ICD-10-CM

## 2020-01-30 DIAGNOSIS — M21371 Foot drop, right foot: Secondary | ICD-10-CM | POA: Diagnosis not present

## 2020-01-30 DIAGNOSIS — R2689 Other abnormalities of gait and mobility: Secondary | ICD-10-CM

## 2020-01-30 DIAGNOSIS — R208 Other disturbances of skin sensation: Secondary | ICD-10-CM

## 2020-01-30 DIAGNOSIS — M6281 Muscle weakness (generalized): Secondary | ICD-10-CM | POA: Diagnosis not present

## 2020-01-30 NOTE — Therapy (Addendum)
[facility] [address] [place], [place], [address] Phone: [phone]   Fax:  [phone]  Physical Therapy Treatment  Patient Details  Name: Patrick [name] MRN: [id number] Date of Birth: Jan 28, 1927 Referring Provider (PT): Kathrynn Ducking, MD   Encounter Date: 01/30/2020   PT End of Session - 01/30/20 1642    Visit Number 4    Number of Visits 17    Date for PT Re-Evaluation 03/09/20    Authorization Type Medicare and BCBS    Progress Note Due on Visit 10    PT Start Time 1447    PT Stop Time 1538    PT Time Calculation (min) 51 min    Activity Tolerance Patient tolerated treatment well    Behavior During Therapy [facility] for tasks assessed/performed           Past Medical History:  Diagnosis Date  . Bilateral foot-drop 12/17/2019  . BPH (benign prostatic hypertrophy)   . Diverticulosis   . ED (erectile dysfunction)   . Gait abnormality 12/17/2019  . GERD (gastroesophageal reflux disease)   . Gout   . Hepatitis    hx hepatitis after mono as teenager  . History of kidney stones   . History of skin cancer   . Hyperlipidemia   . Hypertension   . Hypothyroidism   . IHD (ischemic heart disease)    Remote CABG in 1997  . Memory disorder 12/17/2019  . MI, acute, non ST segment elevation ([place]) 2010   s/p cath with occluded SVG to PD that fills by collaterals and the remainder of his revasculsarization is satisfactory. "the first time they did the stent , the second time they did the graft 8 vessels"  . Nocturia   . Thrombocytopenia ([place]) 11/19/2018  . Tremor, essential 12/17/2019  . Urinary frequency     Past Surgical History:  Procedure Laterality Date  . APPENDECTOMY    . BALLOON DILATION N/A 03/25/2019   Procedure: BALLOON DILATION;  Surgeon: Otis Brace, MD;  [facility];  Service: Gastroenterology;  Laterality: N/A;  . CARDIAC CATHETERIZATION  09/08/2008   NORMAL. EF 60%; Occluded SVG to  PD that fills by collaterals and the remainder of his revascularization is satisfactory. he is managed medically.   . CORONARY ARTERY BYPASS GRAFT  1997   CABG x 8  . CORONARY STENT INTERVENTION N/A 11/18/2018   Procedure: CORONARY STENT INTERVENTION;  Surgeon: Sherren Mocha, MD;  Location: [facility];  Service: Cardiovascular;  Laterality: N/A;  . CYSTOSCOPY WITH INSERTION OF UROLIFT N/A 03/19/2015   Procedure: CYSTOSCOPY WITH INSERTION OF FOUR UROLIFTS;  Surgeon: Carolan Clines, MD;  [facility];  Service: Urology;  Laterality: N/A;  . ESOPHAGOGASTRODUODENOSCOPY (EGD) WITH PROPOFOL N/A 02/12/2017   Procedure: ESOPHAGOGASTRODUODENOSCOPY (EGD) WITH PROPOFOL;  Surgeon: Otis Brace, MD;  [facility];  Service: Gastroenterology;  Laterality: N/A;  . ESOPHAGOGASTRODUODENOSCOPY (EGD) WITH PROPOFOL N/A 07/13/2018   Procedure: ESOPHAGOGASTRODUODENOSCOPY (EGD) WITH PROPOFOL;  Surgeon: Clarene Essex, MD;  [facility];  Service: Endoscopy;  Laterality: N/A;  . ESOPHAGOGASTRODUODENOSCOPY (EGD) WITH PROPOFOL N/A 03/25/2019   Procedure: ESOPHAGOGASTRODUODENOSCOPY (EGD) WITH PROPOFOL;  Surgeon: Otis Brace, MD;  [facility];  Service: Gastroenterology;  Laterality: N/A;  . FOREIGN BODY REMOVAL N/A 07/13/2018   Procedure: FOREIGN BODY REMOVAL;  Surgeon: Clarene Essex, MD;  [facility];  Service: Endoscopy;  Laterality: N/A;  . LEFT HEART CATH AND CORS/GRAFTS ANGIOGRAPHY N/A 11/18/2018   Procedure: LEFT HEART  CATH AND CORS/GRAFTS ANGIOGRAPHY;  Surgeon: Sherren Mocha, MD;  Location: [facility];  Service: Cardiovascular;  Laterality: N/A;    There were no vitals filed for this visit.   Subjective Assessment - 01/30/20 1501    Subjective Wife asking what patient's BP was last session.  Reports after the session that pt had two syncope episodes when getting out of the car.  Pt does not remember episodes.  Pt then had a fall getting out of the  shower, bumped elbow.  No other injuries.    Patient is accompained by: Family member    Pertinent History CAD, orthostatic hypotension, idiopathic neuropathy, bilat foot drop, falls, essential tremor, memory impairments, kidney stones, gout, skin CA, HLD, HTN, hypothyroidism, IHD with CABG in 1997, MI non ST segment elevation, thrombocytopenia    Limitations Walking    Patient Stated Goals To walk without slapping his feet and to be more stable    Currently in Pain? No/denies                   Vestibular Assessment - 01/30/20 1502      Orthostatics   BP supine (x 5 minutes) 140/80    HR supine (x 5 minutes) 72    BP sitting 143/63    HR sitting 72    BP standing (after 1 minute) 100/60    HR standing (after 1 minute) 75    BP standing (after 3 minutes) 142/72    HR standing (after 3 minutes) 86    Orthostatics Comment Wore compression stockings for a few days and pt does not feel it made a difference                    [facility] Adult PT Treatment/Exercise - 01/30/20 1521      Transfers   Transfers Sit to Stand;Stand to Sit    Sit to Stand 5: Supervision;4: Min guard    Sit to Stand Details (indicate cue type and reason) min guard for safety when first standing due to instability    Stand to Sit 5: Supervision      Ambulation/Gait   Ambulation/Gait Yes    Ambulation/Gait Assistance 4: Min guard    Ambulation Distance (Feet) 500 Feet    Assistive device 4-wheeled walker    Gait Pattern Step-through pattern;Decreased dorsiflexion - right;Decreased dorsiflexion - left;Trunk flexed;Poor foot clearance - left;Poor foot clearance - right    Ambulation Surface Unlevel;Outdoor;Paved    Gait Comments Gait outside on sidewalk with rollator; pt required intermittent cues for more upright posture and to maintain safer distance to rollator due to increased trunk flexion and increased shuffling.  No episodes of LE buckling.        Therapeutic Activites    Therapeutic  Activities Other Therapeutic Activities    Other Therapeutic Activities Pt asking if there is a clinic closer to [facility].  Alerted pt that there is an outpatient clinic in [place].  Discussed that if pt and wife wished to transition care they would receive same type of treatment at [facility].  Pt and wife are interested in transitioning to a clinic closer to home.  Pt to work with this PT on Monday and then transition.  Also discussed another option to prevent foot slap and toe drag during ambulation.  Demonstrated to pt the foot up brace.  Pt reports he mostly wears loafers and would not be interested in using the foot up brace as well.  Pt does  have compression stockings at home, pt is willing to wear them.                  PT Education - 01/30/20 1641    Education Details results of orthostatic BP readings, may need to continue to wear compression stockings, gait with rollator, clinic transition    Person(s) Educated Patient;Spouse    Methods Explanation    Comprehension Verbalized understanding            PT Short Term Goals - 01/09/20 1656      PT SHORT TERM GOAL #1   Title Pt will participate in assessment of safety and falls risk with various AD (cane vs. RW).    Time 4    Period Weeks    Status New    Target Date 02/08/20      PT SHORT TERM GOAL #2   Title Pt will initiate and demonstrate ability to perform LE strength and balance HEP with wife's supervision    Time 4    Period Weeks    Status New    Target Date 02/08/20      PT SHORT TERM GOAL #3   Title Pt will participate in assessment of various orthotics to reduce foot drop and improve stance phase stability    Time 4    Period Weeks    Status New    Target Date 02/08/20      PT SHORT TERM GOAL #4   Title Pt will decrease five time sit to stand to </= 20 seconds with use of UE and supervision    Baseline 26 seconds with therapist min-mod A    Time 4    Period Weeks    Status New    Target  Date 02/08/20      PT SHORT TERM GOAL #5   Title Pt will increase DGI to >/= 12/24 to indicate decreased falls risk    Baseline 8/24    Time 4    Period Weeks    Status New    Target Date 02/08/20             PT Long Term Goals - 01/09/20 1702      PT LONG TERM GOAL #1   Title Pt will perform final HEP consistently with wife's supervision    Time 8    Period Weeks    Status New    Target Date 03/09/20      PT LONG TERM GOAL #2   Title Pt will perform five time sit <> stand with UE support in </= 15 seconds    Time 8    Period Weeks    Status New    Target Date 03/09/20      PT LONG TERM GOAL #3   Title Pt will increase DGI to >/= 16/24 with LRAD and appropriate orthosis to indicate decreased falls risk    Time 8    Period Weeks    Status New    Target Date 03/09/20      PT LONG TERM GOAL #4   Title Pt will ambulate 500' outside over paved and grassy terrain with appropriate orthosis and LRAD with supervision    Time 8    Period Weeks    Status New    Target Date 03/09/20      PT LONG TERM GOAL #5   Title Pt will report he has returned to breakfast with friends and safely entering/exiting restaurant with LRAD and supervision  Time 8    Period Weeks    Status New    Target Date 03/09/20                 Plan - 01/31/20 1745    Clinical Impression Statement Due to patient experiencing another syncope episode after last session performed assessment of orthostatics.  Pt did experience symptoms and significant drop from supine > standing.  When assessing for hypotension after prolonged sitting > standing, pt did not experience a significant drop in BP and BP remained stable after 3 minutes of standing.  Pt reports he has compression stockings at home but hasn't been wearing them because he didn't see a difference.  Pt continues to not be open to using an AFO or foot up brace.  Pt is willing to ambulate with the use of a rollator.  Performed gait outside with  rollator over uneven paved surfaces.  Pt will benefit from further safety and gait training with rollator.  Will continue to monitor and will continue to address and progress towards LTG.    Personal Factors and Comorbidities Age;Behavior Pattern;Comorbidity 3+;Fitness    Comorbidities CAD, orthostatic hypotension, idiopathic neuropathy, bilat foot drop, falls, essential tremor, memory impairments, kidney stones, gout, skin CA, HLD, HTN, hypothyroidism, IHD with CABG in 1997, MI non ST segment elevation, thrombocytopenia    Examination-Activity Limitations Locomotion Level;Stairs;Stand    Examination-Participation Restrictions Community Activity;Yard Work    [employer] Evolving/Moderate complexity    Rehab Potential Fair    PT Frequency 2x / week    PT Duration 8 weeks    PT Treatment/Interventions ADLs/Self Care Home Management;Aquatic Therapy;Electrical Stimulation;DME Instruction;Gait training;Stair training;Functional mobility training;Therapeutic activities;Therapeutic exercise;Balance training;Neuromuscular re-education;Patient/family education;Orthotic Fit/Training;Energy conservation    PT Next Visit Plan ASSESS VITALS each visit. CHECK STG.  Scifit for endurance. Gait training with rollator for safety.  Initiate HEP for LE strength (mainly ankles), balance, endurance    Consulted and Agree with Plan of Care Patient;Family member/caregiver    Family Member Consulted Wife - [name]           Patient will benefit from skilled therapeutic intervention in order to improve the following deficits and impairments:  Abnormal gait, Decreased activity tolerance, Cardiopulmonary status limiting activity, Decreased balance, Decreased coordination, Decreased endurance, Decreased strength, Difficulty walking, Impaired sensation  Visit Diagnosis: Repeated falls  Foot drop, left  Other disturbances of skin sensation  Foot drop, right  Muscle weakness  (generalized)  Unsteadiness on feet  Other abnormalities of gait and mobility     Problem List Patient Active Problem List   Diagnosis Date Noted  . Memory disorder 12/17/2019  . Bilateral foot-drop 12/17/2019  . Gait abnormality 12/17/2019  . Tremor, essential 12/17/2019  . AKI (acute kidney injury) ([place]) 06/23/2019  . Chest pain, rule out acute myocardial infarction 06/23/2019  . Diverticulitis of intestine with perforation and abscess without bleeding 02/26/2019  . Hypokalemia 02/26/2019  . Occult blood positive stool 02/26/2019  . Acute diverticulitis 02/19/2019  . Thrombocytopenia ([place]) 11/19/2018  . Unstable angina ([place]) 11/14/2018  . Essential (primary) hypertension 02/25/2018  . Claudication ([place]) 04/21/2016  . History of cold sores 03/22/2011  . Coronary artery disease involving native coronary artery of native heart without angina pectoris 10/28/2010  . Hyperlipemia 10/28/2010  . Hypothyroidism 10/28/2010    Rico Junker, PT, DPT 01/31/20    5:59 PM    [facility] [address] [place], [place], [address] Phone: [phone]  Fax:  [phone]  Name: Patrick [name] MRN: [id number] Date of Birth: 1926/09/08

## 2020-02-02 ENCOUNTER — Telehealth: Payer: Self-pay | Admitting: *Deleted

## 2020-02-02 ENCOUNTER — Ambulatory Visit: Payer: Medicare Other | Admitting: Physical Therapy

## 2020-02-02 NOTE — Progress Notes (Deleted)
CARDIOLOGY OFFICE NOTE  Date:  02/02/2020    Patrick [name] Date of Birth: 09-16-1926 Medical Record #[id number]  PCP:  Marton Redwood, MD  Cardiologist:  Servando Snare & ***    No chief complaint on file.   History of Present Illness: Patrick [name] is a 86 y.o. male who presents today for a ***    Comes in today. Here with   Past Medical History:  Diagnosis Date  . Bilateral foot-drop 12/17/2019  . BPH (benign prostatic hypertrophy)   . Diverticulosis   . ED (erectile dysfunction)   . Gait abnormality 12/17/2019  . GERD (gastroesophageal reflux disease)   . Gout   . Hepatitis    hx hepatitis after mono as teenager  . History of kidney stones   . History of skin cancer   . Hyperlipidemia   . Hypertension   . Hypothyroidism   . IHD (ischemic heart disease)    Remote CABG in 1997  . Memory disorder 12/17/2019  . MI, acute, non ST segment elevation ([place]) 2010   s/p cath with occluded SVG to PD that fills by collaterals and the remainder of his revasculsarization is satisfactory. "the first time they did the stent , the second time they did the graft 8 vessels"  . Nocturia   . Thrombocytopenia ([place]) 11/19/2018  . Tremor, essential 12/17/2019  . Urinary frequency     Past Surgical History:  Procedure Laterality Date  . APPENDECTOMY    . BALLOON DILATION N/A 03/25/2019   Procedure: BALLOON DILATION;  Surgeon: Otis Brace, MD;  [facility];  Service: Gastroenterology;  Laterality: N/A;  . CARDIAC CATHETERIZATION  09/08/2008   NORMAL. EF 60%; Occluded SVG to PD that fills by collaterals and the remainder of his revascularization is satisfactory. he is managed medically.   . CORONARY ARTERY BYPASS GRAFT  1997   CABG x 8  . CORONARY STENT INTERVENTION N/A 11/18/2018   Procedure: CORONARY STENT INTERVENTION;  Surgeon: Sherren Mocha, MD;  Location: [facility];  Service: Cardiovascular;  Laterality: N/A;  . CYSTOSCOPY WITH INSERTION OF UROLIFT N/A  03/19/2015   Procedure: CYSTOSCOPY WITH INSERTION OF FOUR UROLIFTS;  Surgeon: Carolan Clines, MD;  [facility];  Service: Urology;  Laterality: N/A;  . ESOPHAGOGASTRODUODENOSCOPY (EGD) WITH PROPOFOL N/A 02/12/2017   Procedure: ESOPHAGOGASTRODUODENOSCOPY (EGD) WITH PROPOFOL;  Surgeon: Otis Brace, MD;  [facility];  Service: Gastroenterology;  Laterality: N/A;  . ESOPHAGOGASTRODUODENOSCOPY (EGD) WITH PROPOFOL N/A 07/13/2018   Procedure: ESOPHAGOGASTRODUODENOSCOPY (EGD) WITH PROPOFOL;  Surgeon: Clarene Essex, MD;  [facility];  Service: Endoscopy;  Laterality: N/A;  . ESOPHAGOGASTRODUODENOSCOPY (EGD) WITH PROPOFOL N/A 03/25/2019   Procedure: ESOPHAGOGASTRODUODENOSCOPY (EGD) WITH PROPOFOL;  Surgeon: Otis Brace, MD;  [facility];  Service: Gastroenterology;  Laterality: N/A;  . FOREIGN BODY REMOVAL N/A 07/13/2018   Procedure: FOREIGN BODY REMOVAL;  Surgeon: Clarene Essex, MD;  [facility];  Service: Endoscopy;  Laterality: N/A;  . LEFT HEART CATH AND CORS/GRAFTS ANGIOGRAPHY N/A 11/18/2018   Procedure: LEFT HEART CATH AND CORS/GRAFTS ANGIOGRAPHY;  Surgeon: Sherren Mocha, MD;  Location: [facility];  Service: Cardiovascular;  Laterality: N/A;     Medications: No outpatient medications have been marked as taking for the 02/04/20 encounter (Appointment) with Burtis Junes, NP.     Allergies: Allergies  Allergen Reactions  . Halcion [Triazolam]     "Makes me crazy"  . Pravachol     Unknown    Social History:  The patient  reports that he quit smoking about 61 years ago. His smoking use included cigarettes. He has a 15.00 pack-year smoking history. He has never used smokeless tobacco. He reports current alcohol use. He reports that he does not use drugs.   Family History: The patient's ***family history includes Heart failure (age of onset: 16) in his mother.   Review of Systems: Please see the history of present illness.    All other systems are reviewed and negative.   Physical Exam: VS:  There were no vitals taken for this visit. Marland Kitchen  BMI There is no height or weight on file to calculate BMI.  Wt Readings from Last 3 Encounters:  12/17/19 165 lb 5 oz (75 kg)  12/10/19 164 lb 6.4 oz (74.6 kg)  10/08/19 164 lb (74.4 kg)    General: Pleasant. Well developed, well nourished and in no acute distress.   HEENT: Normal.  Neck: Supple, no JVD, carotid bruits, or masses noted.  Cardiac: ***Regular rate and rhythm. No murmurs, rubs, or gallops. No edema.  Respiratory:  Lungs are clear to auscultation bilaterally with normal work of breathing.  GI: Soft and nontender.  MS: No deformity or atrophy. Gait and ROM intact.  Skin: Warm and dry. Color is normal.  Neuro:  Strength and sensation are intact and no gross focal deficits noted.  Psych: Alert, appropriate and with normal affect.   LABORATORY DATA:  EKG:  EKG {ACTION; IS/IS [id number]} ordered today.  Personally reviewed by me. This demonstrates ***.  Lab Results  Component Value Date   WBC 6.1 06/23/2019   HGB 14.1 06/23/2019   HCT 42.8 06/23/2019   PLT 101 (L) 06/23/2019   GLUCOSE 106 (H) 06/24/2019   CHOL 91 11/15/2018   TRIG 39 11/15/2018   HDL 42 11/15/2018   LDLCALC 41 11/15/2018   ALT 26 06/24/2019   AST 17 06/24/2019   NA 136 06/24/2019   K 3.8 06/24/2019   CL 103 06/24/2019   CREATININE 1.19 06/24/2019   BUN 12 06/24/2019   CO2 24 06/24/2019   TSH 3.330 04/29/2019   INR 1.1 03/25/2019     BNP (last 3 results) No results for input(s): BNP in the last 8760 hours.  ProBNP (last 3 results) No results for input(s): PROBNP in the last 8760 hours.   Other Studies Reviewed Today:   Assessment/Plan:    Current medicines are reviewed with the patient today.  The patient does not have concerns regarding medicines other than what has been noted above.  The following changes have been made:  See above.  Labs/ tests ordered today  include:   No orders of the defined types were placed in this encounter.    Disposition:   FU with *** in {gen number [id number]} {Days to years:10300}.   Patient is agreeable to this plan and will call if any problems develop in the interim.   SignedTruitt Merle, NP  02/02/2020 3:24 PM  [facility] [address] [place], [place]  [address] Phone: [phone] Fax: [phone]

## 2020-02-02 NOTE — Telephone Encounter (Signed)
S/w pt's wife per (DPR) R/S appt per phone call from pt's spouse. Aware of time and date of new appt.

## 2020-02-02 NOTE — Telephone Encounter (Signed)
S/w pt's spouse per (DPR) to see if pt wants to move appt to tomorrow.  Will call back in one hour, slot is held for tomorrow.

## 2020-02-03 ENCOUNTER — Other Ambulatory Visit: Payer: Self-pay

## 2020-02-03 ENCOUNTER — Ambulatory Visit (INDEPENDENT_AMBULATORY_CARE_PROVIDER_SITE_OTHER): Payer: Medicare Other | Admitting: Nurse Practitioner

## 2020-02-03 ENCOUNTER — Ambulatory Visit (INDEPENDENT_AMBULATORY_CARE_PROVIDER_SITE_OTHER): Payer: Medicare Other

## 2020-02-03 ENCOUNTER — Encounter: Payer: Self-pay | Admitting: Nurse Practitioner

## 2020-02-03 VITALS — BP 140/80 | HR 72 | Ht 72.0 in | Wt 163.6 lb

## 2020-02-03 DIAGNOSIS — E782 Mixed hyperlipidemia: Secondary | ICD-10-CM | POA: Diagnosis not present

## 2020-02-03 DIAGNOSIS — R5383 Other fatigue: Secondary | ICD-10-CM | POA: Diagnosis not present

## 2020-02-03 DIAGNOSIS — R55 Syncope and collapse: Secondary | ICD-10-CM | POA: Diagnosis not present

## 2020-02-03 DIAGNOSIS — I10 Essential (primary) hypertension: Secondary | ICD-10-CM

## 2020-02-03 DIAGNOSIS — I2510 Atherosclerotic heart disease of native coronary artery without angina pectoris: Secondary | ICD-10-CM | POA: Diagnosis not present

## 2020-02-03 DIAGNOSIS — R200 Anesthesia of skin: Secondary | ICD-10-CM | POA: Diagnosis not present

## 2020-02-03 NOTE — Patient Instructions (Addendum)
After Visit Summary:  We will be checking the following labs today - NONE   Medication Instructions:    Continue with your current medicines. BUT  I am stopping the Ranexa.     If you need a refill on your cardiac medications before your next appointment, please call your pharmacy.     Testing/Procedures To Be Arranged:  30 day event monitor.  Follow-Up:   See me in about 6 weeks.     At [facility], you and your health needs are our priority.  As part of our continuing mission to provide you with exceptional heart care, we have created designated Provider Care Teams.  These Care Teams include your primary Cardiologist (physician) and Advanced Practice Providers (APPs -  Physician Assistants and Nurse Practitioners) who all work together to provide you with the care you need, when you need it.  Special Instructions:  . Stay safe, wash your hands for at least 20 seconds and wear a mask when needed.  . It was good to talk with you today.  . Knee high compression stockings . Would hold on the PT for now   Call the [facility] office at [phone] if you have any questions, problems or concerns.

## 2020-02-03 NOTE — Progress Notes (Signed)
CARDIOLOGY OFFICE NOTE  Date:  02/03/2020    Patrick [name] Date of Birth: 03-16-1927 Medical Record #[id number]  PCP:  Marton Redwood, MD  Cardiologist:  [facility]    Chief Complaint  Patient presents with  . Follow-up    History of Present Illness: Patrick [name] is a 86 y.o. male who presents today for a follow up/work in visit. Seen for Dr. Acie Fredrickson. He is a former patient of Dr. Susa Simmonds.He is primarily seeing me now.  He has a known history of CAD with remote CABG from 15 (Dr. Redmond Pulling), HTN, HLD, hypothyroidism and GERD.   His wife reached out to me just before the July 4th holiday weekend of 2020 - he was having marked SOB and unstable angina -had not been doing well for several weeks -he was referred for admission.His cardiac enzymes were negative for MI and his ECG was nonacute. An echocardiogram showed preserved left ventricular function with no wall motion abnormalities, buttherewascalciumon both mitral and aortic valves.   He underwent cardiac cath with Dr. Burt Knack on7/10/2018 withDES to the SVG-distal RCA. He was noted to have thrombocytopenia -withno bleeding issues. BP was elevated and his ARB was increased.   His TSH waspreviouslymarkedly low -he has had his dose of Synthroid reduced, then stopped and now back onincreasingdose.   I have seen himback several times- he developed marked orthostasis - not clear to me as to why -ended upoff most of his medicines - was placed on Florinef with some improvement. Hiscath films have been reviewed -lots of native disease - trying to manage medically - he was onplaced on Ranexa.He has had an abdominal abscess from diverticulitis and was back in the hospital. 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  He has continued to feel poorly - very angry about his overall situation - not coping well with aging.He  issleeping more - legs cold - they were worried about circulation in his legs - doppler study was normal. He was readmittedin February 2021with chest pain - had negative Myoview - echo was repeated and was stable. To manage medically but his medicines were not changed. We did a telehealth visit following that admission - BP started trending up and we added back some ARB.Has continued to have lots of fatigue - still sleeping 15 hours a day - not as angry and crying with the increase in Zoloft by PCP but more issues with his neuropathy. Wehaveincreased his Ranexa with the hopes that we could improve on his symptoms.Unfortunately, not much improvement. He has progressively declined - balance is poor - having several falls. On and off Florinef/ARB with grossly fluctuating BP. Has seen neurology.   Comes in today. Here with his wife [name]. He has continued to have some falls - she feels he is actually passing out. He really has no recollection of the events - does not seem to have any warning. Legs remain weak. Very resistant to using a walker. Has tried to get some PT - seems quite limited by his BP - sounds like it is low. She notes readings typically over 140. He remains off his Losartan. Not really getting Florinef. Fortunately, he has not gotten injured seriously.   Past Medical History:  Diagnosis Date  . Bilateral foot-drop 12/17/2019  . BPH (benign prostatic hypertrophy)   . Diverticulosis   . ED (erectile dysfunction)   . Gait abnormality 12/17/2019  .  GERD (gastroesophageal reflux disease)   . Gout   . Hepatitis    hx hepatitis after mono as teenager  . History of kidney stones   . History of skin cancer   . Hyperlipidemia   . Hypertension   . Hypothyroidism   . IHD (ischemic heart disease)    Remote CABG in 1997  . Memory disorder 12/17/2019  . MI, acute, non ST segment elevation ([place]) 2010   s/p cath with occluded SVG to PD that fills by collaterals and the remainder of his  revasculsarization is satisfactory. "the first time they did the stent , the second time they did the graft 8 vessels"  . Nocturia   . Thrombocytopenia ([place]) 11/19/2018  . Tremor, essential 12/17/2019  . Urinary frequency     Past Surgical History:  Procedure Laterality Date  . APPENDECTOMY    . BALLOON DILATION N/A 03/25/2019   Procedure: BALLOON DILATION;  Surgeon: Otis Brace, MD;  Location: [facility];  Service: Gastroenterology;  Laterality: N/A;  . CARDIAC CATHETERIZATION  09/08/2008   NORMAL. EF 60%; Occluded SVG to PD that fills by collaterals and the remainder of his revascularization is satisfactory. he is managed medically.   . CORONARY ARTERY BYPASS GRAFT  1997   CABG x 8  . CORONARY STENT INTERVENTION N/A 11/18/2018   Procedure: CORONARY STENT INTERVENTION;  Surgeon: Sherren Mocha, MD;  Location: [facility];  Service: Cardiovascular;  Laterality: N/A;  . CYSTOSCOPY WITH INSERTION OF UROLIFT N/A 03/19/2015   Procedure: CYSTOSCOPY WITH INSERTION OF FOUR UROLIFTS;  Surgeon: Carolan Clines, MD;  Location: [facility];  Service: Urology;  Laterality: N/A;  . ESOPHAGOGASTRODUODENOSCOPY (EGD) WITH PROPOFOL N/A 02/12/2017   Procedure: ESOPHAGOGASTRODUODENOSCOPY (EGD) WITH PROPOFOL;  Surgeon: Otis Brace, MD;  Location: [facility];  Service: Gastroenterology;  Laterality: N/A;  . ESOPHAGOGASTRODUODENOSCOPY (EGD) WITH PROPOFOL N/A 07/13/2018   Procedure: ESOPHAGOGASTRODUODENOSCOPY (EGD) WITH PROPOFOL;  Surgeon: Clarene Essex, MD;  Location: [facility];  Service: Endoscopy;  Laterality: N/A;  . ESOPHAGOGASTRODUODENOSCOPY (EGD) WITH PROPOFOL N/A 03/25/2019   Procedure: ESOPHAGOGASTRODUODENOSCOPY (EGD) WITH PROPOFOL;  Surgeon: Otis Brace, MD;  Location: [facility];  Service: Gastroenterology;  Laterality: N/A;  . FOREIGN BODY REMOVAL N/A 07/13/2018   Procedure: FOREIGN BODY REMOVAL;  Surgeon: Clarene Essex, MD;  Location: [facility];  Service: Endoscopy;  Laterality:  N/A;  . LEFT HEART CATH AND CORS/GRAFTS ANGIOGRAPHY N/A 11/18/2018   Procedure: LEFT HEART CATH AND CORS/GRAFTS ANGIOGRAPHY;  Surgeon: Sherren Mocha, MD;  Location: [facility];  Service: Cardiovascular;  Laterality: N/A;     Medications: Current Meds  Medication Sig  . allopurinol (ZYLOPRIM) 100 MG tablet Take 100 mg by mouth daily.  Marland Kitchen aspirin 81 MG tablet Take 81 mg by mouth daily.    Marland Kitchen donepezil (ARICEPT) 10 MG tablet Take 10 mg by mouth at bedtime.   . finasteride (PROSCAR) 5 MG tablet Take 5 mg by mouth daily.  . fludrocortisone (FLORINEF) 0.1 MG tablet Take 1 tablet (0.1 mg total) by mouth daily.  Marland Kitchen levothyroxine (SYNTHROID) 112 MCG tablet Take 112 mcg by mouth daily.  . mirtazapine (REMERON) 15 MG tablet SMARTSIG:1 Pill By Mouth Every Night  . nitroGLYCERIN (NITROSTAT) 0.4 MG SL tablet Place 1 tablet (0.4 mg total) under the tongue every 5 (five) minutes as needed.  . NONFORMULARY OR COMPOUNDED ITEM Elderly male with impaired mobility, Benefits from outpatient PT per physical therapy.  . pantoprazole (PROTONIX) 40 MG tablet Take 1 tablet (40 mg total) by mouth  daily before breakfast.  . sertraline (ZOLOFT) 100 MG tablet Take 100 mg by mouth daily.  Marland Kitchen VITAMIN D PO Take by mouth.  . Vitamin D, Ergocalciferol, (DRISDOL) 50000 units CAPS capsule Take 50,000 Units by mouth once a week.  . [DISCONTINUED] ranolazine (RANEXA) 500 MG 12 hr tablet Take 2 tablets (1,000 mg total) by mouth 2 (two) times daily.     Allergies: Allergies  Allergen Reactions  . Halcion [Triazolam]     "Makes me crazy"  . Pravachol     Unknown    Social History: The patient  reports that he quit smoking about 61 years ago. His smoking use included cigarettes. He has a 15.00 pack-year smoking history. He has never used smokeless tobacco. He reports current alcohol use. He reports that he does not use drugs.   Family History: The patient's family history includes Heart failure (age of onset: 43) in  his mother.   Review of Systems: Please see the history of present illness.   All other systems are reviewed and negative.   Physical Exam: VS:  BP 140/80   Pulse 72   Ht 6' (1.829 m)   Wt 163 lb 9.6 oz (74.2 kg)   SpO2 99%   BMI 22.19 kg/m  .  BMI Body mass index is 22.19 kg/m.  Wt Readings from Last 3 Encounters:  02/03/20 163 lb 9.6 oz (74.2 kg)  12/17/19 165 lb 5 oz (75 kg)  12/10/19 164 lb 6.4 oz (74.6 kg)    General: Alert and in no acute distress.  He looks good today.  Cardiac: Regular rate and rhythm. No murmurs, rubs, or gallops. No edema.  Respiratory:  Lungs are clear to auscultation bilaterally with normal work of breathing.  GI: Soft and nontender.  MS: No deformity or atrophy. Gait and ROM intact.  Skin: Warm and dry. Color is normal.  Neuro:  Strength and sensation are intact and no gross focal deficits noted.  Psych: Alert, appropriate and with normal affect.   LABORATORY DATA:  EKG:  EKG is not ordered today.    Lab Results  Component Value Date   WBC 6.1 06/23/2019   HGB 14.1 06/23/2019   HCT 42.8 06/23/2019   PLT 101 (L) 06/23/2019   GLUCOSE 106 (H) 06/24/2019   CHOL 91 11/15/2018   TRIG 39 11/15/2018   HDL 42 11/15/2018   LDLCALC 41 11/15/2018   ALT 26 06/24/2019   AST 17 06/24/2019   NA 136 06/24/2019   K 3.8 06/24/2019   CL 103 06/24/2019   CREATININE 1.19 06/24/2019   BUN 12 06/24/2019   CO2 24 06/24/2019   TSH 3.330 04/29/2019   INR 1.1 03/25/2019       BNP (last 3 results) No results for input(s): BNP in the last 8760 hours.  ProBNP (last 3 results) No results for input(s): PROBNP in the last 8760 hours.   Other Studies Reviewed Today:  MYOVIEWIMPRESSION2/2021: 1. No reversible ischemia or infarction.  2. Normal left ventricular wall motion.  3. Left ventricular ejection fraction is 58%.  4. Non invasive risk stratification*: Low  *2012 Appropriate Use Criteria for Coronary Revascularization Focused  Update: J Am Coll Cardiol. 7564;33(2):951-884. http://content.airportbarriers.com.aspx?articleid=[email or web address]   Electronically Signed By: Markus Daft M.D.   ECHOIMPRESSIONS2/2021 1. Left ventricular ejection fraction, by estimation, is 60 to 65%. The  left ventricle has normal function. The left ventrical has no regional  wall motion abnormalities. There is moderately increased left ventricular  hypertrophy. Left ventricular  diastolic parameters are consistent with Grade I diastolic dysfunction  (impaired relaxation). Elevated left ventricular end-diastolic pressure.  2. Right ventricular systolic function is normal. The right ventricular  size is normal. Tricuspid regurgitation signal is inadequate for assessing  PA pressure.  3. No evidence of mitral valve regurgitation. Moderate mitral annular  calcification of the posterior MV annulus.  4. The aortic valve is tricuspid. Moderately calcified non coronary cusp.  Aortic valve regurgitation is not visualized. Aortic valve mean gradient  measures 10.0 mmHg. Aortic valve peak gradient measures 14.5 mmHg. Aortic  valve area, by VTI measures 3.69  cm.  5. The inferior vena cava is normal in size with greater than 50%  respiratory variability, suggesting right atrial pressure of 3 mmHg.     CARDIAC CATH: 11/18/2018 1. Severe native vessel coronary artery disease with total occlusion of the RCA, total occlusion of left circumflex, and severe stenosis leading into total occlusion of the mid LAD 2. Status post multivessel coronary bypass surgery with continued patency of the saphenous vein graft RCA, sequential saphenous vein graft to OM1 and distal circumflex, and LIMA to diagonal and mid LAD 3. Severe stenosis involving the ostium/proximal portion of the SVG to distal RCA, treated successfully with a 3.5 x 20 mm Synergy DES utilizing distal embolic protection  Recommendations: Dual antiplatelet therapy with aspirin  and clopidogrel 12 months as tolerated Intervention    ASSESSMENT & PLAN:   1. Falls/?syncope - ?autonomic dysfunction - will check 30 day monitor.   2. CAD - remote CABG, prior PCI July 2020 - off Plavix. No chest pain. I am going to stop the Ranexa today due to continued issues with what is presumed orthostasis.   3. Persistent Fatigue - I still think this is multifactorial.   4. Tremor/falls/confusion - apparently does not have Parkinson's   5. Severe peripheral neuropathy/gait disorder - he really needs to be using a walker. PT has been limited by his BP issues. He is off Gabapentin.   6. HTN - labile - she is monitoring - not really using the Florinef - remains off Losartan as well.   7. Depression - this seems better today.    Current medicines are reviewed with the patient today.  The patient does not have concerns regarding medicines other than what has been noted above.  The following changes have been made:  See above.  Labs/ tests ordered today include:    Orders Placed This Encounter  Procedures  . CARDIAC EVENT MONITOR     Disposition:   FU with me in about 6 weeks after the monitor is complete.     Patient is agreeable to this plan and will call if any problems develop in the interim.   SignedTruitt Merle, NP  02/03/2020 4:13 PM  [facility] [address] [place],   [address] Phone: [phone] Fax: [phone]

## 2020-02-04 ENCOUNTER — Encounter: Payer: Medicare Other | Admitting: Rehabilitative and Restorative Service Providers"

## 2020-02-04 ENCOUNTER — Ambulatory Visit: Payer: Medicare Other | Admitting: Nurse Practitioner

## 2020-02-06 ENCOUNTER — Ambulatory Visit: Payer: Medicare Other | Admitting: Physical Therapy

## 2020-02-06 ENCOUNTER — Encounter: Payer: Medicare Other | Admitting: Rehabilitative and Restorative Service Providers"

## 2020-02-16 ENCOUNTER — Other Ambulatory Visit: Payer: Self-pay | Admitting: Nurse Practitioner

## 2020-02-18 ENCOUNTER — Ambulatory Visit: Payer: Medicare Other

## 2020-02-18 DIAGNOSIS — M6281 Muscle weakness (generalized): Secondary | ICD-10-CM | POA: Diagnosis not present

## 2020-02-18 DIAGNOSIS — Z9861 Coronary angioplasty status: Secondary | ICD-10-CM | POA: Diagnosis not present

## 2020-02-18 DIAGNOSIS — I209 Angina pectoris, unspecified: Secondary | ICD-10-CM | POA: Diagnosis not present

## 2020-02-18 DIAGNOSIS — I10 Essential (primary) hypertension: Secondary | ICD-10-CM | POA: Diagnosis not present

## 2020-02-18 DIAGNOSIS — I2510 Atherosclerotic heart disease of native coronary artery without angina pectoris: Secondary | ICD-10-CM | POA: Diagnosis not present

## 2020-02-18 DIAGNOSIS — R3911 Hesitancy of micturition: Secondary | ICD-10-CM | POA: Diagnosis not present

## 2020-02-18 DIAGNOSIS — E785 Hyperlipidemia, unspecified: Secondary | ICD-10-CM | POA: Diagnosis not present

## 2020-02-18 DIAGNOSIS — E039 Hypothyroidism, unspecified: Secondary | ICD-10-CM | POA: Diagnosis not present

## 2020-02-18 DIAGNOSIS — N401 Enlarged prostate with lower urinary tract symptoms: Secondary | ICD-10-CM | POA: Diagnosis not present

## 2020-02-18 DIAGNOSIS — E538 Deficiency of other specified B group vitamins: Secondary | ICD-10-CM | POA: Diagnosis not present

## 2020-02-18 DIAGNOSIS — D696 Thrombocytopenia, unspecified: Secondary | ICD-10-CM | POA: Diagnosis not present

## 2020-02-18 DIAGNOSIS — R2681 Unsteadiness on feet: Secondary | ICD-10-CM | POA: Diagnosis not present

## 2020-02-18 DIAGNOSIS — Z23 Encounter for immunization: Secondary | ICD-10-CM | POA: Diagnosis not present

## 2020-02-20 ENCOUNTER — Ambulatory Visit: Payer: Medicare Other

## 2020-02-23 ENCOUNTER — Ambulatory Visit: Payer: Medicare Other | Admitting: Physical Therapy

## 2020-02-26 ENCOUNTER — Ambulatory Visit: Payer: Medicare Other

## 2020-03-01 ENCOUNTER — Telehealth: Payer: Self-pay | Admitting: *Deleted

## 2020-03-01 ENCOUNTER — Ambulatory Visit: Payer: Medicare Other | Admitting: Physical Therapy

## 2020-03-01 NOTE — Progress Notes (Signed)
CARDIOLOGY OFFICE NOTE  Date:  03/08/2020    Patrick Rica H Mcdiarmid Date of Birth: May 12, 1927 Medical Record #[id number]  PCP:  Marton Redwood, MD  Cardiologist:  [facility]    Chief Complaint  Patient presents with  . Follow-up    Seen for Dr. Acie Fredrickson    History of Present Illness: Patrick [name] is a 86 y.o. male who presents today for a follow up visit. Seen for Dr. Acie Fredrickson. He is a former patient of Dr. Susa Simmonds.He is primarily seeing me now.  He has a known history of CAD with remote CABG from 44 (Dr. Redmond Pulling), HTN, HLD, hypothyroidism and GERD.   His wife reached out to me just before the July 4th holiday weekend of 2020 - he was having marked SOB and unstable angina -had not been doing well for several weeks -he was referred for admission.His cardiac enzymes were negative for MI and his ECG was nonacute. An echocardiogram showed preserved left ventricular function with no wall motion abnormalities, buttherewascalciumon both mitral and aortic valves.   He underwent cardiac cath with Dr. Burt Knack on7/10/2018 withDES to the SVG-distal RCA. He was noted to have thrombocytopenia -withno bleeding issues. BP was elevated and his ARB was increased.   His TSH waspreviouslymarkedly low -he has had his dose of Synthroid reduced, then stopped and now back onincreasingdose.   I have seen himback several times- he developed marked orthostasis - not clear to me as to why -ended upoff most of his medicines - was placed on Florinef with some improvement. Hiscath films have been reviewed -lots of native disease - trying to manage medically - he was onplaced on Ranexa.He has had an abdominal abscess from diverticulitis and was back in the hospital. 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  He has continued to feel poorly - very angry about his overall situation - not coping well with  aging.He issleeping more - legs cold - they were worried about circulation in his legs - doppler study was normal. He was readmittedin February 2021with chest pain - had negative Myoview - echo was repeated and was stable. To manage medically but his medicines were not changed. We did a telehealth visit following that admission - BP started trending up and we added back some ARB.Has continued to havelots of fatigue - still sleeping 15 hours a day - not as angry and crying with the increase in Zoloftby PCPbut more issues with his neuropathy. Wehaveincreased his Ranexa with the hopes that we could improve on his symptoms.Unfortunately, not much improvement.He has progressively declined - balance is poor - having several falls. On and off Florinef/ARB with grossly fluctuating BP. Has seen neurology.   My last visit was back in September - he was falling - [name] thought it was more "passing out". Not really  orthostatic - off ARB and really not needing Florinef - I stopped his Ranexa and arranged for 30 day monitor.   Comes in today. Here with his wife [name]. Doing about the same.  Just turned 89. He is bothered by his legs - weak and wobbly. No real chest pain. Sleeping 15 hours a day. Seems to feel the best/safest at home and in his bed. [name] notes more confusion and more memory issues.   Past Medical History:  Diagnosis Date  . Bilateral foot-drop 12/17/2019  . BPH (benign prostatic hypertrophy)   . Diverticulosis   .  ED (erectile dysfunction)   . Gait abnormality 12/17/2019  . GERD (gastroesophageal reflux disease)   . Gout   . Hepatitis    hx hepatitis after mono as teenager  . History of kidney stones   . History of skin cancer   . Hyperlipidemia   . Hypertension   . Hypothyroidism   . IHD (ischemic heart disease)    Remote CABG in 1997  . Memory disorder 12/17/2019  . MI, acute, non ST segment elevation ([name]) 2010   s/p cath with occluded SVG to PD that fills by collaterals  and the remainder of his revasculsarization is satisfactory. "the first time they did the stent , the second time they did the graft 8 vessels"  . Nocturia   . Thrombocytopenia ([place]) 11/19/2018  . Tremor, essential 12/17/2019  . Urinary frequency     Past Surgical History:  Procedure Laterality Date  . APPENDECTOMY    . BALLOON DILATION N/A 03/25/2019   Procedure: BALLOON DILATION;  Surgeon: Otis Brace, MD;  Location: [facility];  Service: Gastroenterology;  Laterality: N/A;  . CARDIAC CATHETERIZATION  09/08/2008   NORMAL. EF 60%; Occluded SVG to PD that fills by collaterals and the remainder of his revascularization is satisfactory. he is managed medically.   . CORONARY ARTERY BYPASS GRAFT  1997   CABG x 8  . CORONARY STENT INTERVENTION N/A 11/18/2018   Procedure: CORONARY STENT INTERVENTION;  Surgeon: Sherren Mocha, MD;  Location: [facility];  Service: Cardiovascular;  Laterality: N/A;  . CYSTOSCOPY WITH INSERTION OF UROLIFT N/A 03/19/2015   Procedure: CYSTOSCOPY WITH INSERTION OF FOUR UROLIFTS;  Surgeon: Carolan Clines, MD;  Location: [facility];  Service: Urology;  Laterality: N/A;  . ESOPHAGOGASTRODUODENOSCOPY (EGD) WITH PROPOFOL N/A 02/12/2017   Procedure: ESOPHAGOGASTRODUODENOSCOPY (EGD) WITH PROPOFOL;  Surgeon: Otis Brace, MD;  Location: [facility];  Service: Gastroenterology;  Laterality: N/A;  . ESOPHAGOGASTRODUODENOSCOPY (EGD) WITH PROPOFOL N/A 07/13/2018   Procedure: ESOPHAGOGASTRODUODENOSCOPY (EGD) WITH PROPOFOL;  Surgeon: Clarene Essex, MD;  Location: [facility];  Service: Endoscopy;  Laterality: N/A;  . ESOPHAGOGASTRODUODENOSCOPY (EGD) WITH PROPOFOL N/A 03/25/2019   Procedure: ESOPHAGOGASTRODUODENOSCOPY (EGD) WITH PROPOFOL;  Surgeon: Otis Brace, MD;  Location: [facility];  Service: Gastroenterology;  Laterality: N/A;  . FOREIGN BODY REMOVAL N/A 07/13/2018   Procedure: FOREIGN BODY REMOVAL;  Surgeon: Clarene Essex, MD;  Location: [facility];   Service: Endoscopy;  Laterality: N/A;  . LEFT HEART CATH AND CORS/GRAFTS ANGIOGRAPHY N/A 11/18/2018   Procedure: LEFT HEART CATH AND CORS/GRAFTS ANGIOGRAPHY;  Surgeon: Sherren Mocha, MD;  Location: [facility];  Service: Cardiovascular;  Laterality: N/A;     Medications: Current Meds  Medication Sig  . allopurinol (ZYLOPRIM) 100 MG tablet Take 100 mg by mouth daily.  Marland Kitchen aspirin 81 MG tablet Take 81 mg by mouth daily.    Marland Kitchen atorvastatin (LIPITOR) 20 MG tablet Take 1 tablet (20 mg total) by mouth daily.  Marland Kitchen donepezil (ARICEPT) 10 MG tablet Take 10 mg by mouth at bedtime.   . finasteride (PROSCAR) 5 MG tablet Take 5 mg by mouth daily.  . fludrocortisone (FLORINEF) 0.1 MG tablet Take 1 tablet (0.1 mg total) by mouth daily.  Marland Kitchen levothyroxine (SYNTHROID) 112 MCG tablet Take 112 mcg by mouth daily.  Marland Kitchen LINZESS 72 MCG capsule Take 72 mcg by mouth as needed.   . mirtazapine (REMERON) 15 MG tablet SMARTSIG:1 Pill By Mouth Every Night  . nitroGLYCERIN (NITROSTAT) 0.4 MG SL tablet Place 1 tablet (0.4 mg total) under  the tongue every 5 (five) minutes as needed.  . NONFORMULARY OR COMPOUNDED ITEM Elderly male with impaired mobility, Benefits from outpatient PT per physical therapy.  . pantoprazole (PROTONIX) 40 MG tablet Take 1 tablet (40 mg total) by mouth daily before breakfast.  . sertraline (ZOLOFT) 100 MG tablet Take 100 mg by mouth daily.  . tamsulosin (FLOMAX) 0.4 MG CAPS capsule Take 0.4 mg by mouth daily after supper.   Marland Kitchen VITAMIN D PO Take by mouth.  . Vitamin D, Ergocalciferol, (DRISDOL) 50000 units CAPS capsule Take 50,000 Units by mouth once a week.     Allergies: Allergies  Allergen Reactions  . Halcion [Triazolam]     "Makes me crazy"  . Pravachol     Unknown    Social History: The patient  reports that he quit smoking about 61 years ago. His smoking use included cigarettes. He has a 15.00 pack-year smoking history. He has never used smokeless tobacco. He reports current  alcohol use. He reports that he does not use drugs.   Family History: The patient's family history includes Heart failure (age of onset: 64) in his mother.   Review of Systems: Please see the history of present illness.   All other systems are reviewed and negative.   Physical Exam: VS:  BP 126/60 (BP Location: Left Arm, Patient Position: Sitting, Cuff Size: Normal)   Pulse 81   Ht 6' (1.829 m)   Wt 161 lb 9.6 oz (73.3 kg)   SpO2 100%   BMI 21.92 kg/m  .  BMI Body mass index is 21.92 kg/m.  Wt Readings from Last 3 Encounters:  03/08/20 161 lb 9.6 oz (73.3 kg)  02/03/20 163 lb 9.6 oz (74.2 kg)  12/17/19 165 lb 5 oz (75 kg)    General: Alert and in no acute distress. His weight is down a few more pounds.    HEENT: Normal.  Neck: Supple, no JVD, carotid bruits, or masses noted.  Cardiac: Regular rate and rhythm. No murmurs, rubs, or gallops. No edema.  Respiratory:  Lungs are clear to auscultation bilaterally with normal work of breathing.  GI: Soft and nontender.  MS: No deformity or atrophy. Gait and ROM intact.  Skin: Warm and dry. Color is normal.  Neuro:  Strength and sensation are intact and no gross focal deficits noted.  Psych: Alert, appropriate and with normal affect.   LABORATORY DATA:  EKG:  EKG is not ordered today.   Lab Results  Component Value Date   WBC 6.1 06/23/2019   HGB 14.1 06/23/2019   HCT 42.8 06/23/2019   PLT 101 (L) 06/23/2019   GLUCOSE 106 (H) 06/24/2019   CHOL 91 11/15/2018   TRIG 39 11/15/2018   HDL 42 11/15/2018   LDLCALC 41 11/15/2018   ALT 26 06/24/2019   AST 17 06/24/2019   NA 136 06/24/2019   K 3.8 06/24/2019   CL 103 06/24/2019   CREATININE 1.19 06/24/2019   BUN 12 06/24/2019   CO2 24 06/24/2019   TSH 3.330 04/29/2019   INR 1.1 03/25/2019       BNP (last 3 results) No results for input(s): BNP in the last 8760 hours.  ProBNP (last 3 results) No results for input(s): PROBNP in the last 8760 hours.   Other Studies  Reviewed Today:   MYOVIEWIMPRESSION2/2021: 1. No reversible ischemia or infarction.  2. Normal left ventricular wall motion.  3. Left ventricular ejection fraction is 58%.  4. Non invasive risk stratification*: Low  *2012  Appropriate Use Criteria for Coronary Revascularization Focused Update: J Am Coll Cardiol. 6378;58(8):502-774. http://content.airportbarriers.com.aspx?articleid=1201161   Electronically Signed By: Markus Daft M.D.   ECHOIMPRESSIONS2/2021 1. Left ventricular ejection fraction, by estimation, is 60 to 65%. The  left ventricle has normal function. The left ventrical has no regional  wall motion abnormalities. There is moderately increased left ventricular  hypertrophy. Left ventricular  diastolic parameters are consistent with Grade I diastolic dysfunction  (impaired relaxation). Elevated left ventricular end-diastolic pressure.  2. Right ventricular systolic function is normal. The right ventricular  size is normal. Tricuspid regurgitation signal is inadequate for assessing  PA pressure.  3. No evidence of mitral valve regurgitation. Moderate mitral annular  calcification of the posterior MV annulus.  4. The aortic valve is tricuspid. Moderately calcified non coronary cusp.  Aortic valve regurgitation is not visualized. Aortic valve mean gradient  measures 10.0 mmHg. Aortic valve peak gradient measures 14.5 mmHg. Aortic  valve area, by VTI measures 3.69  cm.  5. The inferior vena cava is normal in size with greater than 50%  respiratory variability, suggesting right atrial pressure of 3 mmHg.     CARDIAC CATH: 11/18/2018 1. Severe native vessel coronary artery disease with total occlusion of the RCA, total occlusion of left circumflex, and severe stenosis leading into total occlusion of the mid LAD 2. Status post multivessel coronary bypass surgery with continued patency of the saphenous vein graft RCA, sequential saphenous  vein graft to OM1 and distal circumflex, and LIMA to diagonal and mid LAD 3. Severe stenosis involving the ostium/proximal portion of the SVG to distal RCA, treated successfully with a 3.5 x 20 mm Synergy DES utilizing distal embolic protection  Recommendations: Dual antiplatelet therapy with aspirin and clopidogrel 12 months as tolerated Intervention    ASSESSMENT & PLAN:   1. Falls - ? Autonomic dysfunction - his monitor is reassuring.   2. CAD - remote CABG - prior PCI July of 2020 - off Plavix. Off Ranexa as well - did not really seem to help.   3. Persistent Fatigue - I think this is multifactorial - most likely will not change.   4. Tremor/falls/confusion  5. Neuropathy - seeing neurology.   6. HTN - not needing Florinef and remains off Losartan as well - no changes made - BP is fine here today.   7. Depression   Current medicines are reviewed with the patient today.  The patient does not have concerns regarding medicines other than what has been noted above.  The following changes have been made:  See above.  Labs/ tests ordered today include:   No orders of the defined types were placed in this encounter.    Disposition:   FU with me in about 2 months.    Patient is agreeable to this plan and will call if any problems develop in the interim.   SignedTruitt Merle, NP  03/08/2020 4:21 PM  [facility] [address] [place], [place]  [address] Phone: [phone] Fax: [phone]

## 2020-03-01 NOTE — Telephone Encounter (Signed)
S/w pt's wife per [facility]) is aware appt had been moved and the appt time was to early.  Moved appt time and date to accommodate pt's schedule.

## 2020-03-04 ENCOUNTER — Ambulatory Visit: Payer: Medicare Other

## 2020-03-08 ENCOUNTER — Encounter: Payer: Self-pay | Admitting: Nurse Practitioner

## 2020-03-08 ENCOUNTER — Other Ambulatory Visit: Payer: Self-pay

## 2020-03-08 ENCOUNTER — Ambulatory Visit (INDEPENDENT_AMBULATORY_CARE_PROVIDER_SITE_OTHER): Payer: Medicare Other | Admitting: Nurse Practitioner

## 2020-03-08 VITALS — BP 126/60 | HR 81 | Ht 72.0 in | Wt 161.6 lb

## 2020-03-08 DIAGNOSIS — R200 Anesthesia of skin: Secondary | ICD-10-CM

## 2020-03-08 DIAGNOSIS — I10 Essential (primary) hypertension: Secondary | ICD-10-CM

## 2020-03-08 DIAGNOSIS — E782 Mixed hyperlipidemia: Secondary | ICD-10-CM

## 2020-03-08 DIAGNOSIS — I2510 Atherosclerotic heart disease of native coronary artery without angina pectoris: Secondary | ICD-10-CM

## 2020-03-08 NOTE — Patient Instructions (Addendum)
After Visit Summary:  We will be checking the following labs today - NONE   Medication Instructions:    Continue with your current medicines.    If you need a refill on your cardiac medications before your next appointment, please call your pharmacy.     Testing/Procedures To Be Arranged:  N/A  Follow-Up:   See me in about 2 months.     At [facility], you and your health needs are our priority.  As part of our continuing mission to provide you with exceptional heart care, we have created designated Provider Care Teams.  These Care Teams include your primary Cardiologist (physician) and Advanced Practice Providers (APPs -  Physician Assistants and Nurse Practitioners) who all work together to provide you with the care you need, when you need it.  Special Instructions:  . Stay safe, wash your hands for at least 20 seconds and wear a mask when needed.  . It was good to talk with you today.    Call the [place] office at [phone] if you have any questions, problems or concerns.

## 2020-03-10 ENCOUNTER — Ambulatory Visit: Payer: Medicare Other | Admitting: Nurse Practitioner

## 2020-03-17 ENCOUNTER — Ambulatory Visit: Payer: Medicare Other | Admitting: Nurse Practitioner

## 2020-04-06 DIAGNOSIS — D224 Melanocytic nevi of scalp and neck: Secondary | ICD-10-CM | POA: Diagnosis not present

## 2020-04-06 DIAGNOSIS — D485 Neoplasm of uncertain behavior of skin: Secondary | ICD-10-CM | POA: Diagnosis not present

## 2020-04-06 DIAGNOSIS — L821 Other seborrheic keratosis: Secondary | ICD-10-CM | POA: Diagnosis not present

## 2020-04-06 DIAGNOSIS — L723 Sebaceous cyst: Secondary | ICD-10-CM | POA: Diagnosis not present

## 2020-04-06 DIAGNOSIS — D225 Melanocytic nevi of trunk: Secondary | ICD-10-CM | POA: Diagnosis not present

## 2020-04-06 DIAGNOSIS — C44319 Basal cell carcinoma of skin of other parts of face: Secondary | ICD-10-CM | POA: Diagnosis not present

## 2020-04-06 DIAGNOSIS — L570 Actinic keratosis: Secondary | ICD-10-CM | POA: Diagnosis not present

## 2020-04-06 DIAGNOSIS — Z85828 Personal history of other malignant neoplasm of skin: Secondary | ICD-10-CM | POA: Diagnosis not present

## 2020-04-19 NOTE — Progress Notes (Signed)
CARDIOLOGY OFFICE NOTE  Date:  05/03/2020    Patrick Rica H Roblero Date of Birth: May 06, 1927 Medical Record #[id number]  PCP:  Marton Redwood, MD  Cardiologist:  [facility]    Chief Complaint  Patient presents with  . Follow-up    Seen for Dr. Acie Fredrickson    History of Present Illness: Patrick [name] is a 86 y.o. male who presents today for a 2 month check. Seen for Dr. Acie Fredrickson. He is a former patient of Dr. Susa Simmonds. He is primarily seeing me now.    He has a known history of CAD with remote CABG from 10 (Dr. Redmond Pulling), HTN, HLD, hypothyroidism and GERD.    His wife reached out to me just before the July 4th holiday weekend of 2020 - he was having marked SOB and unstable angina - had not been doing well for several weeks - he was referred for admission.  His cardiac enzymes were negative for MI and his ECG was nonacute.  An echocardiogram showed preserved left ventricular function with no wall motion abnormalities, but there was calcium on both mitral and aortic valves.    He underwent cardiac cath with Dr. Burt Knack on 11/18/2018 with DES to the SVG-distal RCA. He was noted to have thrombocytopenia - with no bleeding issues. BP was elevated and his ARB was increased.    His TSH was previously markedly low - he has had his dose of Synthroid reduced, then stopped and now back on increasing dose.    I have seen him back multiple times - he developed marked orthostasis - not clear to me as to why - ended up off most of his medicines - was placed on Florinef with some improvement. His cath films have been reviewed - lots of native disease - trying to manage medically - he was on placed on Ranexa. He has had an abdominal abscess from diverticulitis and was back in the hospital. 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    He has continued to feel poorly - very angry about his overall situation - not coping well with  aging. He is sleeping more - legs cold - they were worried about circulation in his legs - doppler study was normal. He was readmitted in February 2021 with chest pain - had negative Myoview - echo was repeated and was stable. To manage medically but his medicines were not changed. We did a telehealth visit following that admission - BP started trending up and we added back some ARB. Has continued to have lots of fatigue - still sleeping 15 hours a day - not as angry and crying with the increase in Zoloft by PCP but more issues with his neuropathy. We have increased his Ranexa with the hopes that we could improve on his symptoms. Unfortunately, not much improvement. He has progressively declined - balance is poor - having several falls. On and off Florinef/ARB with grossly fluctuating BP. Has seen neurology as well.    My our visit was back in September - he was falling - [name] thought it was more "passing out". Not really  orthostatic - was off ARB and really not needing Florinef - I stopped his Ranexa and arranged for 30 day monitor. This was reassuring.   Last seen in October - sleeping 15 hours a day - she noted more confusion and memory issues.    Comes in today.  Here with his wife. She augments the history. He is basically unchanged. Still with lots of neuropathy. Can't feel much below his knees. No chest pain. Not short of breath. Spends most of his time in the bed - seems like that is his "safe" place. Did not want to go back to Neurology.   Past Medical History:  Diagnosis Date  . Bilateral foot-drop 12/17/2019  . BPH (benign prostatic hypertrophy)   . Diverticulosis   . ED (erectile dysfunction)   . Gait abnormality 12/17/2019  . GERD (gastroesophageal reflux disease)   . Gout   . Hepatitis    hx hepatitis after mono as teenager  . History of kidney stones   . History of skin cancer   . Hyperlipidemia   . Hypertension   . Hypothyroidism   . IHD (ischemic heart disease)    Remote CABG  in 1997  . Memory disorder 12/17/2019  . MI, acute, non ST segment elevation ([place]) 2010   s/p cath with occluded SVG to PD that fills by collaterals and the remainder of his revasculsarization is satisfactory. "the first time they did the stent , the second time they did the graft 8 vessels"  . Nocturia   . Thrombocytopenia ([place]) 11/19/2018  . Tremor, essential 12/17/2019  . Urinary frequency     Past Surgical History:  Procedure Laterality Date  . APPENDECTOMY    . BALLOON DILATION N/A 03/25/2019   Procedure: BALLOON DILATION;  Surgeon: Otis Brace, MD;  Location: [facility];  Service: Gastroenterology;  Laterality: N/A;  . CARDIAC CATHETERIZATION  09/08/2008   NORMAL. EF 60%; Occluded SVG to PD that fills by collaterals and the remainder of his revascularization is satisfactory. he is managed medically.   . CORONARY ARTERY BYPASS GRAFT  1997   CABG x 8  . CORONARY STENT INTERVENTION N/A 11/18/2018   Procedure: CORONARY STENT INTERVENTION;  Surgeon: Sherren Mocha, MD;  Location: [place] [facility];  Service: Cardiovascular;  Laterality: N/A;  . CYSTOSCOPY WITH INSERTION OF UROLIFT N/A 03/19/2015   Procedure: CYSTOSCOPY WITH INSERTION OF FOUR UROLIFTS;  Surgeon: Carolan Clines, MD;  Location: [facility];  Service: Urology;  Laterality: N/A;  . ESOPHAGOGASTRODUODENOSCOPY (EGD) WITH PROPOFOL N/A 02/12/2017   Procedure: ESOPHAGOGASTRODUODENOSCOPY (EGD) WITH PROPOFOL;  Surgeon: Otis Brace, MD;  Location: [facility];  Service: Gastroenterology;  Laterality: N/A;  . ESOPHAGOGASTRODUODENOSCOPY (EGD) WITH PROPOFOL N/A 07/13/2018   Procedure: ESOPHAGOGASTRODUODENOSCOPY (EGD) WITH PROPOFOL;  Surgeon: Clarene Essex, MD;  Location: [facility];  Service: Endoscopy;  Laterality: N/A;  . ESOPHAGOGASTRODUODENOSCOPY (EGD) WITH PROPOFOL N/A 03/25/2019   Procedure: ESOPHAGOGASTRODUODENOSCOPY (EGD) WITH PROPOFOL;  Surgeon: Otis Brace, MD;  Location: [facility];  Service: Gastroenterology;   Laterality: N/A;  . FOREIGN BODY REMOVAL N/A 07/13/2018   Procedure: FOREIGN BODY REMOVAL;  Surgeon: Clarene Essex, MD;  Location: [facility];  Service: Endoscopy;  Laterality: N/A;  . LEFT HEART CATH AND CORS/GRAFTS ANGIOGRAPHY N/A 11/18/2018   Procedure: LEFT HEART CATH AND CORS/GRAFTS ANGIOGRAPHY;  Surgeon: Sherren Mocha, MD;  Location: [place] [facility];  Service: Cardiovascular;  Laterality: N/A;     Medications: Current Meds  Medication Sig  . allopurinol (ZYLOPRIM) 100 MG tablet Take 100 mg by mouth daily.  Marland Kitchen aspirin 81 MG tablet Take 81 mg by mouth daily.    Marland Kitchen donepezil (ARICEPT) 10 MG tablet Take 10 mg by mouth at bedtime.   . finasteride (PROSCAR) 5 MG tablet Take 5 mg by mouth daily.  Marland Kitchen levothyroxine (SYNTHROID) 112 MCG tablet  Take 112 mcg by mouth daily.  Marland Kitchen LINZESS 72 MCG capsule Take 72 mcg by mouth as needed.   . mirtazapine (REMERON) 15 MG tablet SMARTSIG:1 Pill By Mouth Every Night  . nitroGLYCERIN (NITROSTAT) 0.4 MG SL tablet Place 1 tablet (0.4 mg total) under the tongue every 5 (five) minutes as needed.  . sertraline (ZOLOFT) 100 MG tablet Take 100 mg by mouth daily.  . tamsulosin (FLOMAX) 0.4 MG CAPS capsule Take 0.4 mg by mouth daily after supper.   . Vitamin D, Ergocalciferol, (DRISDOL) 50000 units CAPS capsule Take 50,000 Units by mouth once a week.     Allergies: Allergies  Allergen Reactions  . Halcion [Triazolam]     "Makes me crazy"  . Pravachol     Unknown    Social History: The patient  reports that he quit smoking about 62 years ago. His smoking use included cigarettes. He has a 15.00 pack-year smoking history. He has never used smokeless tobacco. He reports current alcohol use. He reports that he does not use drugs.   Family History: The patient's family history includes Heart failure (age of onset: 5) in his mother.   Review of Systems: Please see the history of present illness.   All other systems are reviewed and negative.   Physical  Exam: VS:  BP 130/70   Pulse 80   Ht 6' (1.829 m)   Wt 162 lb 9.6 oz (73.8 kg)   SpO2 98%   BMI 22.05 kg/m  .  BMI Body mass index is 22.05 kg/m.  Wt Readings from Last 3 Encounters:  05/03/20 162 lb 9.6 oz (73.8 kg)  03/08/20 161 lb 9.6 oz (73.3 kg)  02/03/20 163 lb 9.6 oz (74.2 kg)    General: Pleasant. Alert and in no acute distress.  Neck: Supple, no JVD, carotid bruits, or masses noted.  Cardiac: Regular rate and rhythm. No murmurs, rubs, or gallops. No edema.  Respiratory:  Lungs are clear to auscultation bilaterally with normal work of breathing.  GI: Soft and nontender.  MS: No deformity or atrophy. Gait and ROM intact.  Skin: Warm and dry. Color is normal.  Neuro:  Strength and sensation are intact and no gross focal deficits noted.  Psych: Alert, appropriate and with normal affect.   LABORATORY DATA:  EKG:  EKG is not ordered today.    Lab Results  Component Value Date   WBC 6.1 06/23/2019   HGB 14.1 06/23/2019   HCT 42.8 06/23/2019   PLT 101 (L) 06/23/2019   GLUCOSE 106 (H) 06/24/2019   CHOL 91 11/15/2018   TRIG 39 11/15/2018   HDL 42 11/15/2018   LDLCALC 41 11/15/2018   ALT 26 06/24/2019   AST 17 06/24/2019   NA 136 06/24/2019   K 3.8 06/24/2019   CL 103 06/24/2019   CREATININE 1.19 06/24/2019   BUN 12 06/24/2019   CO2 24 06/24/2019   TSH 3.330 04/29/2019   INR 1.1 03/25/2019       BNP (last 3 results) No results for input(s): BNP in the last 8760 hours.  ProBNP (last 3 results) No results for input(s): PROBNP in the last 8760 hours.   Other Studies Reviewed Today:  MYOVIEW IMPRESSION 06/2019: 1. No reversible ischemia or infarction.   2. Normal left ventricular wall motion.   3. Left ventricular ejection fraction is 58%.   4. Non invasive risk stratification*: Low   *2012 Appropriate Use Criteria for Coronary Revascularization Focused Update: J Am Coll Cardiol.  1610;96(0):454-098. http://content.airportbarriers.com.aspx?articleid=[email or web address]     Electronically Signed   By: Markus Daft M.D.      ECHO IMPRESSIONS 06/2019  1. Left ventricular ejection fraction, by estimation, is 60 to 65%. The  left ventricle has normal function. The left ventrical has no regional  wall motion abnormalities. There is moderately increased left ventricular  hypertrophy. Left ventricular  diastolic parameters are consistent with Grade I diastolic dysfunction  (impaired relaxation). Elevated left ventricular end-diastolic pressure.   2. Right ventricular systolic function is normal. The right ventricular  size is normal. Tricuspid regurgitation signal is inadequate for assessing  PA pressure.   3. No evidence of mitral valve regurgitation. Moderate mitral annular  calcification of the posterior MV annulus.   4. The aortic valve is tricuspid. Moderately calcified non coronary cusp.  Aortic valve regurgitation is not visualized. Aortic valve mean gradient  measures 10.0 mmHg. Aortic valve peak gradient measures 14.5 mmHg. Aortic  valve area, by VTI measures 3.69   cm.   5. The inferior vena cava is normal in size with greater than 50%  respiratory variability, suggesting right atrial pressure of 3 mmHg.        CARDIAC CATH: 11/18/2018 1.  Severe native vessel coronary artery disease with total occlusion of the RCA, total occlusion of left circumflex, and severe stenosis leading into total occlusion of the mid LAD 2.  Status post multivessel coronary bypass surgery with continued patency of the saphenous vein graft RCA, sequential saphenous vein graft to OM1 and distal circumflex, and LIMA to diagonal and mid LAD 3.  Severe stenosis involving the ostium/proximal portion of the SVG to distal RCA, treated successfully with a 3.5 x 20 mm Synergy DES utilizing distal embolic protection   Recommendations: Dual antiplatelet therapy with aspirin and clopidogrel 12 months as  tolerated Intervention       ASSESSMENT & PLAN:     1. CAD - remote CABG - prior PCI of July of 2020 - off DAPT - off Ranexa as well - did not really seem to help. No worrisome chest pain. Would favor conservative management.   2. Persistent fatigue - most likely multifactorial.   3. ?autonomic dysfunction/neuropathy - not really much to offer. I think his goal is safety.   4. Tremor/falls/confusion - memory seems to be a little worse.   5. HTN - off Florinef and off Losartan as well - BP is fine.   6. Depression   Current medicines are reviewed with the patient today.  The patient does not have concerns regarding medicines other than what has been noted above.  The following changes have been made:  See above.  Labs/ tests ordered today include:   No orders of the defined types were placed in this encounter.    Disposition:   FU with me in February. I have told them that I am leaving in February.    Patient is agreeable to this plan and will call if any problems develop in the interim.   SignedTruitt Merle, NP  05/03/2020 4:26 PM  [facility] [address] [place], [place]  [address] Phone: [phone] Fax: [phone]

## 2020-04-22 ENCOUNTER — Ambulatory Visit: Payer: Medicare Other | Admitting: Neurology

## 2020-05-03 ENCOUNTER — Ambulatory Visit (INDEPENDENT_AMBULATORY_CARE_PROVIDER_SITE_OTHER): Payer: Medicare Other | Admitting: Nurse Practitioner

## 2020-05-03 ENCOUNTER — Encounter: Payer: Self-pay | Admitting: Nurse Practitioner

## 2020-05-03 ENCOUNTER — Other Ambulatory Visit: Payer: Self-pay

## 2020-05-03 VITALS — BP 130/70 | HR 80 | Ht 72.0 in | Wt 162.6 lb

## 2020-05-03 DIAGNOSIS — R200 Anesthesia of skin: Secondary | ICD-10-CM

## 2020-05-03 DIAGNOSIS — R5383 Other fatigue: Secondary | ICD-10-CM

## 2020-05-03 DIAGNOSIS — I2510 Atherosclerotic heart disease of native coronary artery without angina pectoris: Secondary | ICD-10-CM | POA: Diagnosis not present

## 2020-05-03 DIAGNOSIS — E782 Mixed hyperlipidemia: Secondary | ICD-10-CM | POA: Diagnosis not present

## 2020-05-03 DIAGNOSIS — R55 Syncope and collapse: Secondary | ICD-10-CM | POA: Diagnosis not present

## 2020-05-03 NOTE — Patient Instructions (Addendum)
After Visit Summary:  We will be checking the following labs today - NONE   Medication Instructions:    Continue with your current medicines.    If you need a refill on your cardiac medications before your next appointment, please call your pharmacy.     Testing/Procedures To Be Arranged:  N/A  Follow-Up:   See me in February    At [facility], you and your health needs are our priority.  As part of our continuing mission to provide you with exceptional heart care, we have created designated Provider Care Teams.  These Care Teams include your primary Cardiologist (physician) and Advanced Practice Providers (APPs -  Physician Assistants and Nurse Practitioners) who all work together to provide you with the care you need, when you need it.  Special Instructions:  . Stay safe, wash your hands for at least 20 seconds and wear a mask when needed.  . It was good to talk with you today.    Call the [place] office at [phone] if you have any questions, problems or concerns.

## 2020-05-26 DIAGNOSIS — G250 Essential tremor: Secondary | ICD-10-CM | POA: Diagnosis not present

## 2020-05-26 DIAGNOSIS — F0280 Dementia in other diseases classified elsewhere without behavioral disturbance: Secondary | ICD-10-CM | POA: Diagnosis not present

## 2020-05-26 DIAGNOSIS — E039 Hypothyroidism, unspecified: Secondary | ICD-10-CM | POA: Diagnosis not present

## 2020-05-26 DIAGNOSIS — M7022 Olecranon bursitis, left elbow: Secondary | ICD-10-CM | POA: Diagnosis not present

## 2020-05-26 DIAGNOSIS — I2510 Atherosclerotic heart disease of native coronary artery without angina pectoris: Secondary | ICD-10-CM | POA: Diagnosis not present

## 2020-05-26 DIAGNOSIS — I10 Essential (primary) hypertension: Secondary | ICD-10-CM | POA: Diagnosis not present

## 2020-05-26 DIAGNOSIS — R269 Unspecified abnormalities of gait and mobility: Secondary | ICD-10-CM | POA: Diagnosis not present

## 2020-05-26 DIAGNOSIS — F3341 Major depressive disorder, recurrent, in partial remission: Secondary | ICD-10-CM | POA: Diagnosis not present

## 2020-05-26 DIAGNOSIS — D696 Thrombocytopenia, unspecified: Secondary | ICD-10-CM | POA: Diagnosis not present

## 2020-05-26 DIAGNOSIS — E538 Deficiency of other specified B group vitamins: Secondary | ICD-10-CM | POA: Diagnosis not present

## 2020-05-26 DIAGNOSIS — I700 Atherosclerosis of aorta: Secondary | ICD-10-CM | POA: Diagnosis not present

## 2020-05-26 DIAGNOSIS — I209 Angina pectoris, unspecified: Secondary | ICD-10-CM | POA: Diagnosis not present

## 2020-05-31 ENCOUNTER — Other Ambulatory Visit: Payer: Self-pay | Admitting: Nurse Practitioner

## 2020-05-31 DIAGNOSIS — U071 COVID-19: Secondary | ICD-10-CM

## 2020-05-31 NOTE — Progress Notes (Signed)
Received message on Saturday night regarding a home COVID test - it is positive.   Has had exposure from wife.   Referral for COVID treatment placed.   Burtis Junes, RN, [facility] [address] [place], [place]  [phone]

## 2020-06-01 ENCOUNTER — Telehealth: Payer: Self-pay | Admitting: *Deleted

## 2020-06-01 ENCOUNTER — Other Ambulatory Visit: Payer: Self-pay | Admitting: Family

## 2020-06-01 ENCOUNTER — Telehealth: Payer: Self-pay | Admitting: Family

## 2020-06-01 DIAGNOSIS — E782 Mixed hyperlipidemia: Secondary | ICD-10-CM

## 2020-06-01 DIAGNOSIS — U071 COVID-19: Secondary | ICD-10-CM

## 2020-06-01 DIAGNOSIS — I2510 Atherosclerotic heart disease of native coronary artery without angina pectoris: Secondary | ICD-10-CM

## 2020-06-01 DIAGNOSIS — I10 Essential (primary) hypertension: Secondary | ICD-10-CM

## 2020-06-01 NOTE — Telephone Encounter (Signed)
Call placed to daughter, [name].  She has been advised that per Truitt Merle, NP, pt is #[phone] on the [facility] and they felt like he will get a call today and to keep the phone by their side. She was very thankful for the assistance.

## 2020-06-01 NOTE — Progress Notes (Signed)
I connected by phone with Patrick [name] on 06/01/2020 at 2:35 PM to discuss the potential use of a new treatment for mild to moderate COVID-19 viral infection in non-hospitalized patients.  This patient is a 87 y.o. male that meets the FDA criteria for Emergency Use Authorization of COVID monoclonal antibody sotrovimab.  Has a (+) direct SARS-CoV-2 viral test result  Has mild or moderate COVID-19   Is NOT hospitalized due to COVID-19  Is within 10 days of symptom onset  Has at least one of the high risk factor(s) for progression to severe COVID-19 and/or hospitalization as defined in EUA.  Specific high risk criteria : Older age (>/= 85 yo) and Cardiovascular disease or hypertension   I have spoken and communicated the following to the patient or parent/caregiver regarding COVID monoclonal antibody treatment:  1. FDA has authorized the emergency use for the treatment of mild to moderate COVID-19 in adults and pediatric patients with positive results of direct SARS-CoV-2 viral testing who are 76 years of age and older weighing at least 40 kg, and who are at high risk for progressing to severe COVID-19 and/or hospitalization.  2. The significant known and potential risks and benefits of COVID monoclonal antibody, and the extent to which such potential risks and benefits are unknown.  3. Information on available alternative treatments and the risks and benefits of those alternatives, including clinical trials.  4. Patients treated with COVID monoclonal antibody should continue to self-isolate and use infection control measures (e.g., wear mask, isolate, social distance, avoid sharing personal items, clean and disinfect "high touch" surfaces, and frequent handwashing) according to CDC guidelines.   5. The patient or parent/caregiver has the option to accept or refuse COVID monoclonal antibody treatment.  After reviewing this information with the patient, the patient has agreed to receive  one of the available covid 19 monoclonal antibodies and will be provided an appropriate fact sheet prior to infusion.   Patrick Po, FNP 06/01/2020 2:35 PM

## 2020-06-01 NOTE — Telephone Encounter (Signed)
Called to discuss with patient about COVID-19 symptoms and the use of one of the available treatments for those with mild to moderate Covid symptoms and at a high risk of hospitalization.  Pt appears to qualify for outpatient treatment due to co-morbid conditions and/or a member of an at-risk group in accordance with the FDA Emergency Use Authorization.    Symptom onset: 05/29/20 Symptoms: cough  Vaccinated: Yes Booster? Yes Immunocompromised? No Qualifiers: age, hyperlipidemia, hypertension, CAD  Home COVID test was positive. We discussed the risks, benefits, and potential financial costs associated with treatment and wishes to continue with Sotrovimab.   Hello Patrick [name],   We contacted you because you were recently diagnosed with COVID-19 and may benefit from a new treatment for mild to moderate disease. This treatment helps reduce the chance of being hospitalized. For some patients with medical conditions that may increase the chances of an infection, the treatment also decreases the risk for serious symptoms related to COVID-19.   The Food and Drug Administration (FDA) approved emergency use of a new drug to treat patients with mild to moderate symptoms who have risk factors that could cause severe symptoms related to COVID-19. This new treatment is a monoclonal antibody. It works by attaching like a magnet to the SARS-CoV2 virus (the virus that causes COVID-19) and stops it from infecting more cells in your body. It does not kill the virus, but it prevents it from spreading throughout your body with the hope that it will decrease your symptoms after it is administered.   This new drug is an intravenous (IV) infusion called Sotrovimab that is given over one 30-minute session in our [facility] outpatient infusion clinic. You will need to stay about 60 minutes after the infusion to ensure you are tolerating it well and to watch for any allergic reaction to the medication. More information  will be given to you at the time of your appointment.   Important information:  . The potential side effects: 2-4% of recipients experience nausea, vomiting, diarrhea, dizziness, headaches, itching, worsening fevers or chills for around 24 hours. . There have been no serious infusion-related reactions. . Of the more than 3,000 patients who received the infusion, only one had an allergic response that ended once the infusion was stopped. This is why we monitor all of our patients closely for 60 minutes after the infusion.  . The COVID-19 vaccine (including boosters) must be delayed at least 90 days after receiving this infusion.  . The medication itself is free, but your insurance will be charged an infusion fee. The amount you may owe later varies from insurance to insurance. If you do not have insurance, we can put you in touch with our billing department. Please contact your insurance agent to discuss prior to your appointment if you would like further details about billing specific to your policy. The CMS code is: M83  . If you have been tested outside of a [facility], you MUST bring a copy of your positive test with you the morning of your appointment. You may take a photo of this and upload to your MyChart portal,  have the testing facility fax the result to [phone] or email a copy to [email or web address].    You have been scheduled to receive the monoclonal antibody therapy at [facility]:  06/02/20 at 12:30 pm   The address for the infusion clinic site is:   --The GPS address is [address], and the parking is  located near the [facility] building where you will see a "COVID19 Infusion" feather banner marking the entrance to parking. (See photos below.)            --Enter into the 2nd entrance where the "wave, flag banner" is at the road. Turn into this 2nd entrance and immediately turn left or right to park in one of the marked spaces.   --Please stay  in your car and call the desk for assistance inside at [phone]. Let us know which space you are in.    --The average time in the department is roughly 90 minutes to two hours for monoclonal treatment. This includes preparation of the medication, IV start and the required 60-minute monitoring after the infusion.     Should you develop worsening shortness of breath, chest pain or severe breathing problems, please do not wait for this appointment and go to the emergency room for evaluation and treatment instead. You will undergo another oxygen screen before your infusion to ensure this is the best treatment option for you. There is a chance that the best decision may be to send you to the emergency room for evaluation at the time of your appointment.    The day of your visit, you should: Marland Kitchen Get plenty of rest the night before and drink plenty of water. . Eat a light meal/snack before coming and take your medications as prescribed.  . Wear warm, comfortable clothes with a shirt that can roll-up over the elbow (will need IV start).  . Wear a mask.  . Consider bringing an activity to help pass the time.   Terri Piedra, NP 06/01/2020 2:35 PM

## 2020-06-02 ENCOUNTER — Ambulatory Visit (HOSPITAL_COMMUNITY)
Admission: RE | Admit: 2020-06-02 | Discharge: 2020-06-02 | Disposition: A | Discharge status: Home / Self Care | Payer: Medicare Other | Source: Ambulatory Visit | Attending: Pulmonary Disease | Admitting: Pulmonary Disease

## 2020-06-02 DIAGNOSIS — U071 COVID-19: Secondary | ICD-10-CM | POA: Insufficient documentation

## 2020-06-02 DIAGNOSIS — E782 Mixed hyperlipidemia: Secondary | ICD-10-CM | POA: Insufficient documentation

## 2020-06-02 DIAGNOSIS — I10 Essential (primary) hypertension: Secondary | ICD-10-CM | POA: Insufficient documentation

## 2020-06-02 DIAGNOSIS — I2510 Atherosclerotic heart disease of native coronary artery without angina pectoris: Secondary | ICD-10-CM | POA: Diagnosis not present

## 2020-06-02 MED ORDER — DIPHENHYDRAMINE HCL 50 MG/ML IJ SOLN: 50.0000 mg | Freq: Once | INTRAMUSCULAR | Status: DC | PRN

## 2020-06-02 MED ORDER — METHYLPREDNISOLONE SODIUM SUCC 125 MG IJ SOLR: 125.0000 mg | Freq: Once | INTRAMUSCULAR | Status: DC | PRN

## 2020-06-02 MED ORDER — ALBUTEROL SULFATE HFA 108 (90 BASE) MCG/ACT IN AERS: 2.0000 | INHALATION_SPRAY | Freq: Once | RESPIRATORY_TRACT | Status: DC | PRN

## 2020-06-02 MED ORDER — EPINEPHRINE 0.3 MG/0.3ML IJ SOAJ: 0.3000 mg | Freq: Once | INTRAMUSCULAR | Status: DC | PRN

## 2020-06-02 MED ORDER — SODIUM CHLORIDE 0.9 % IV SOLN: Freq: Once | INTRAVENOUS | Status: AC

## 2020-06-02 MED ORDER — FAMOTIDINE IN NACL 20-0.9 MG/50ML-% IV SOLN: 20.0000 mg | Freq: Once | INTRAVENOUS | Status: DC | PRN

## 2020-06-02 MED ORDER — SOTROVIMAB 500 MG/8ML IV SOLN
500.0000 mg | Freq: Once | INTRAVENOUS | Status: AC
  Administered 2020-06-02: 500 mg via INTRAVENOUS

## 2020-06-02 MED ORDER — SODIUM CHLORIDE 0.9 % IV SOLN: INTRAVENOUS | Status: DC | PRN

## 2020-06-02 NOTE — Progress Notes (Signed)
Diagnosis: COVID-19  Physician: Dr. Patrick Wright  Procedure: Covid Infusion Clinic Med: Sotrovimab infusion - Provided patient with sotrovimab fact sheet for patients, parents, and caregivers prior to infusion.   Complications: No immediate complications noted  Discharge: Discharged home    

## 2020-06-02 NOTE — Discharge Instructions (Signed)

## 2020-06-02 NOTE — Progress Notes (Signed)
Patient reviewed Fact Sheet for Patients, Parents, and Caregivers for Emergency Use Authorization (EUA) of sotrovimab for the Treatment of Coronavirus. Patient also reviewed and is agreeable to the estimated cost of treatment. Patient/ Wife Patrick [name] is agreeable to proceed.

## 2020-06-08 DIAGNOSIS — H33301 Unspecified retinal break, right eye: Secondary | ICD-10-CM | POA: Diagnosis not present

## 2020-06-08 DIAGNOSIS — H5213 Myopia, bilateral: Secondary | ICD-10-CM | POA: Diagnosis not present

## 2020-06-15 ENCOUNTER — Ambulatory Visit: Payer: Medicare Other | Admitting: Neurology

## 2020-06-21 NOTE — Progress Notes (Signed)
CARDIOLOGY OFFICE NOTE  Date:  07/05/2020    Patrick Rica H Tinnell Date of Birth: Feb 20, 1927 Medical Record #[id number]  PCP:  Marton Redwood, MD  Cardiologist:  [facility]   Chief Complaint  Patient presents with  . Follow-up    History of Present Illness: Patrick [name] is a 87 y.o. male who presents today for a follow up visit. Seen for Dr. Acie Fredrickson. He is a former patient of Dr. Susa Simmonds. He is primarily seeing me now.    He has a known history of CAD with remote CABG from 11 (Dr. Redmond Pulling), HTN, HLD, hypothyroidism and GERD.    His wife reached out to me just before the July 4th holiday weekend of 2020 - he was having marked SOB and unstable angina - had not been doing well for several weeks - he was referred for admission.  His cardiac enzymes were negative for MI and his ECG was nonacute.  An echocardiogram showed preserved left ventricular function with no wall motion abnormalities, but there was calcium on both mitral and aortic valves.    He underwent cardiac cath with Dr. Burt Knack on 11/18/2018 with DES to the SVG-distal RCA. He was noted to have thrombocytopenia - with no bleeding issues. BP was elevated and his ARB was increased.    His TSH was previously markedly low - he has had his dose of Synthroid reduced, then stopped and now back on increasing dose.    I have seen him back multiple times - he developed marked orthostasis - not clear to me as to why - ended up off most of his medicines - was placed on Florinef with some improvement. His cath films have been reviewed - lots of native disease - trying to manage medically - he was on placed on Ranexa. He has had an abdominal abscess from diverticulitis and was back in the hospital. 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    He has continued to feel poorly - very angry about his overall situation - not coping well with aging. He is sleeping more -  legs cold - they were worried about circulation in his legs - doppler study was normal. He was readmitted in February 2021 with chest pain - had negative Myoview - echo was repeated and was stable. To manage medically but his medicines were not changed. We did a telehealth visit following that admission - BP started trending up and we added back some ARB. Has continued to have lots of fatigue - still sleeping 15 hours a day - not as angry and crying with the increase in Zoloft by PCP but more issues with his neuropathy. We have increased his Ranexa with the hopes that we could improve on his symptoms. Unfortunately, not much improvement. He has progressively declined - balance is poor - having several falls. On and off Florinef/ARB with grossly fluctuating BP. Has seen neurology as well.    At ourvisit was back in September - he was falling - [name] thought it was more "passing out". Not really  orthostatic - was off ARB and really not needing Florinef - I stopped his Ranexa and arranged for 30 day monitor. This was reassuring. Last seen in December - lots of neuropathy, more confusion and spending most of his time in bed - felt like that was his "safe" place.    Comes in today. Here with [name].  She notes he is much more forgetful. Had a friend come visit them and he did not remember him - this was sad for her. She is having more issues with her own health. His legs continue to be an issue. No chest pain.   Past Medical History:  Diagnosis Date  . Bilateral foot-drop 12/17/2019  . BPH (benign prostatic hypertrophy)   . Diverticulosis   . ED (erectile dysfunction)   . Gait abnormality 12/17/2019  . GERD (gastroesophageal reflux disease)   . Gout   . Hepatitis    hx hepatitis after mono as teenager  . History of kidney stones   . History of skin cancer   . Hyperlipidemia   . Hypertension   . Hypothyroidism   . IHD (ischemic heart disease)    Remote CABG in 1997  . Memory disorder 12/17/2019  .  MI, acute, non ST segment elevation ([place]) 2010   s/p cath with occluded SVG to PD that fills by collaterals and the remainder of his revasculsarization is satisfactory. "the first time they did the stent , the second time they did the graft 8 vessels"  . Nocturia   . Thrombocytopenia ([place]) 11/19/2018  . Tremor, essential 12/17/2019  . Urinary frequency     Past Surgical History:  Procedure Laterality Date  . APPENDECTOMY    . BALLOON DILATION N/A 03/25/2019   Procedure: BALLOON DILATION;  Surgeon: Otis Brace, MD;  [facility];  Service: Gastroenterology;  Laterality: N/A;  . CARDIAC CATHETERIZATION  09/08/2008   NORMAL. EF 60%; Occluded SVG to PD that fills by collaterals and the remainder of his revascularization is satisfactory. he is managed medically.   . CORONARY ARTERY BYPASS GRAFT  1997   CABG x 8  . CORONARY STENT INTERVENTION N/A 11/18/2018   Procedure: CORONARY STENT INTERVENTION;  Surgeon: Sherren Mocha, MD;  Location: [facility] [place];  Service: Cardiovascular;  Laterality: N/A;  . CYSTOSCOPY WITH INSERTION OF UROLIFT N/A 03/19/2015   Procedure: CYSTOSCOPY WITH INSERTION OF FOUR UROLIFTS;  Surgeon: Carolan Clines, MD;  [facility];  Service: Urology;  Laterality: N/A;  . ESOPHAGOGASTRODUODENOSCOPY (EGD) WITH PROPOFOL N/A 02/12/2017   Procedure: ESOPHAGOGASTRODUODENOSCOPY (EGD) WITH PROPOFOL;  Surgeon: Otis Brace, MD;  [facility];  Service: Gastroenterology;  Laterality: N/A;  . ESOPHAGOGASTRODUODENOSCOPY (EGD) WITH PROPOFOL N/A 07/13/2018   Procedure: ESOPHAGOGASTRODUODENOSCOPY (EGD) WITH PROPOFOL;  Surgeon: Clarene Essex, MD;  [facility];  Service: Endoscopy;  Laterality: N/A;  . ESOPHAGOGASTRODUODENOSCOPY (EGD) WITH PROPOFOL N/A 03/25/2019   Procedure: ESOPHAGOGASTRODUODENOSCOPY (EGD) WITH PROPOFOL;  Surgeon: Otis Brace, MD;  [facility];  Service: Gastroenterology;  Laterality: N/A;  . FOREIGN BODY REMOVAL  N/A 07/13/2018   Procedure: FOREIGN BODY REMOVAL;  Surgeon: Clarene Essex, MD;  [facility];  Service: Endoscopy;  Laterality: N/A;  . LEFT HEART CATH AND CORS/GRAFTS ANGIOGRAPHY N/A 11/18/2018   Procedure: LEFT HEART CATH AND CORS/GRAFTS ANGIOGRAPHY;  Surgeon: Sherren Mocha, MD;  Location: [facility];  Service: Cardiovascular;  Laterality: N/A;     Medications: Current Meds  Medication Sig  . allopurinol (ZYLOPRIM) 100 MG tablet Take 100 mg by mouth daily.  Marland Kitchen aspirin 81 MG tablet Take 81 mg by mouth daily.    Marland Kitchen atorvastatin (LIPITOR) 20 MG tablet Take 1 tablet (20 mg total) by mouth daily.  Marland Kitchen donepezil (ARICEPT) 10 MG tablet Take 10 mg by mouth at bedtime.   . finasteride (PROSCAR) 5 MG tablet Take 5 mg by mouth daily.  Marland Kitchen  fludrocortisone (FLORINEF) 0.1 MG tablet Take 1 tablet (0.1 mg total) by mouth daily.  Marland Kitchen levothyroxine (SYNTHROID) 112 MCG tablet Take 112 mcg by mouth daily.  Marland Kitchen LINZESS 72 MCG capsule Take 72 mcg by mouth as needed.   . mirtazapine (REMERON) 15 MG tablet SMARTSIG:1 Pill By Mouth Every Night  . nitroGLYCERIN (NITROSTAT) 0.4 MG SL tablet Place 1 tablet (0.4 mg total) under the tongue every 5 (five) minutes as needed.  . pantoprazole (PROTONIX) 40 MG tablet Take 1 tablet (40 mg total) by mouth daily before breakfast.  . sertraline (ZOLOFT) 100 MG tablet Take 100 mg by mouth daily.  . tamsulosin (FLOMAX) 0.4 MG CAPS capsule Take 0.4 mg by mouth daily after supper.   . Vitamin D, Ergocalciferol, (DRISDOL) 50000 units CAPS capsule Take 50,000 Units by mouth once a week.     Allergies: Allergies  Allergen Reactions  . Halcion [Triazolam]     "Makes me crazy"  . Pravachol     Unknown    Social History: The patient  reports that he quit smoking about 62 years ago. His smoking use included cigarettes. He has a 15.00 pack-year smoking history. He has never used smokeless tobacco. He reports current alcohol use. He reports that he does not use drugs.    Family History: The patient's family history includes Heart failure (age of onset: 60) in his mother.   Review of Systems: Please see the history of present illness.   All other systems are reviewed and negative.   Physical Exam: VS:  BP 130/70   Pulse 70   Ht 6' (1.829 m)   Wt 160 lb (72.6 kg)   SpO2 99%   BMI 21.70 kg/m  .  BMI Body mass index is 21.7 kg/m.  Wt Readings from Last 3 Encounters:  07/05/20 160 lb (72.6 kg)  05/03/20 162 lb 9.6 oz (73.8 kg)  03/08/20 161 lb 9.6 oz (73.3 kg)    General: Alert and in no acute distress.   Cardiac: Regular rate and rhythm. No murmurs, rubs, or gallops. No edema.  Respiratory:  Lungs are clear to auscultation bilaterally with normal work of breathing.  GI: Soft and nontender.  MS: No deformity or atrophy. Gait and ROM intact.  Skin: Warm and dry. Color is normal.  Neuro:  Strength and sensation are intact and no gross focal deficits noted.  Psych: Alert, appropriate and with normal affect.   LABORATORY DATA:  EKG:  EKG is not ordered today.   Lab Results  Component Value Date   WBC 6.1 06/23/2019   HGB 14.1 06/23/2019   HCT 42.8 06/23/2019   PLT 101 (L) 06/23/2019   GLUCOSE 106 (H) 06/24/2019   CHOL 91 11/15/2018   TRIG 39 11/15/2018   HDL 42 11/15/2018   LDLCALC 41 11/15/2018   ALT 26 06/24/2019   AST 17 06/24/2019   NA 136 06/24/2019   K 3.8 06/24/2019   CL 103 06/24/2019   CREATININE 1.19 06/24/2019   BUN 12 06/24/2019   CO2 24 06/24/2019   TSH 3.330 04/29/2019   INR 1.1 03/25/2019       BNP (last 3 results) No results for input(s): BNP in the last 8760 hours.  ProBNP (last 3 results) No results for input(s): PROBNP in the last 8760 hours.   Other Studies Reviewed Today:  MYOVIEW IMPRESSION 06/2019: 1. No reversible ischemia or infarction.   2. Normal left ventricular wall motion.   3. Left ventricular ejection fraction is 58%.  4. Non invasive risk stratification*: Low   *2012 Appropriate  Use Criteria for Coronary Revascularization Focused Update: J Am Coll Cardiol. N6492421. http://content.airportbarriers.com.aspx?articleid=1201161     Electronically Signed   By: Markus Daft M.D.      ECHO IMPRESSIONS 06/2019  1. Left ventricular ejection fraction, by estimation, is 60 to 65%. The  left ventricle has normal function. The left ventrical has no regional  wall motion abnormalities. There is moderately increased left ventricular  hypertrophy. Left ventricular  diastolic parameters are consistent with Grade I diastolic dysfunction  (impaired relaxation). Elevated left ventricular end-diastolic pressure.   2. Right ventricular systolic function is normal. The right ventricular  size is normal. Tricuspid regurgitation signal is inadequate for assessing  PA pressure.   3. No evidence of mitral valve regurgitation. Moderate mitral annular  calcification of the posterior MV annulus.   4. The aortic valve is tricuspid. Moderately calcified non coronary cusp.  Aortic valve regurgitation is not visualized. Aortic valve mean gradient  measures 10.0 mmHg. Aortic valve peak gradient measures 14.5 mmHg. Aortic  valve area, by VTI measures 3.69   cm.   5. The inferior vena cava is normal in size with greater than 50%  respiratory variability, suggesting right atrial pressure of 3 mmHg.        CARDIAC CATH: 11/18/2018  1.  Severe native vessel coronary artery disease with total occlusion of the RCA, total occlusion of left circumflex, and severe stenosis leading into total occlusion of the mid LAD 2.  Status post multivessel coronary bypass surgery with continued patency of the saphenous vein graft RCA, sequential saphenous vein graft to OM1 and distal circumflex, and LIMA to diagonal and mid LAD 3.  Severe stenosis involving the ostium/proximal portion of the SVG to distal RCA, treated successfully with a 3.5 x 20 mm Synergy DES utilizing distal embolic protection    Recommendations: Dual antiplatelet therapy with aspirin and clopidogrel 12 months as tolerated Intervention       ASSESSMENT & PLAN:     1. CAD - remote CABG with prior PCI in July of 2020 - no longer on DAPT - off Ranexa as well - this really did not help him. No chest pain. Favor conservative management.   2. HTN - BP is fine - he is off Florinef and ARB - BP is fine.   3. Persistent fatigue - most likely multifactorial.   4. ?autonomic dysfunction/neuropathy - not really much to offer -he is resistant to using a walker.   5. History of prior orthostatic hypotension - this has resolved.   6. Memory disorder/tremors - most likely with underlying dementia. Seems a little progressive.    Current medicines are reviewed with the patient today.  The patient does not have concerns regarding medicines other than what has been noted above.  The following changes have been made:  See above.  Labs/ tests ordered today include:   No orders of the defined types were placed in this encounter.    Disposition:   FU with Dr. Stanford Breed going forward due to where they live and Dr. Acie Fredrickson slowing down.    Patient is agreeable to this plan and will call if any problems develop in the interim.   SignedTruitt Merle, NP  07/05/2020 4:34 PM  [facility] [address] [place], [place]  [address] Phone: [phone] Fax: [phone]

## 2020-07-05 ENCOUNTER — Encounter: Payer: Self-pay | Admitting: Nurse Practitioner

## 2020-07-05 ENCOUNTER — Ambulatory Visit (INDEPENDENT_AMBULATORY_CARE_PROVIDER_SITE_OTHER): Payer: Medicare Other | Admitting: Nurse Practitioner

## 2020-07-05 ENCOUNTER — Other Ambulatory Visit: Payer: Self-pay

## 2020-07-05 VITALS — BP 130/70 | HR 70 | Ht 72.0 in | Wt 160.0 lb

## 2020-07-05 DIAGNOSIS — R5383 Other fatigue: Secondary | ICD-10-CM

## 2020-07-05 DIAGNOSIS — E782 Mixed hyperlipidemia: Secondary | ICD-10-CM

## 2020-07-05 DIAGNOSIS — I2510 Atherosclerotic heart disease of native coronary artery without angina pectoris: Secondary | ICD-10-CM

## 2020-07-05 NOTE — Patient Instructions (Addendum)
After Visit Summary:  We will be checking the following labs today - NONE   Medication Instructions:    Continue with your current medicines.    If you need a refill on your cardiac medications before your next appointment, please call your pharmacy.     Testing/Procedures To Be Arranged:  N/A  Follow-Up:   See Dr. Kirk Ruths - we will get you an appointment with him and let you know.     At [facility], you and your health needs are our priority.  As part of our continuing mission to provide you with exceptional heart care, we have created designated Provider Care Teams.  These Care Teams include your primary Cardiologist (physician) and Advanced Practice Providers (APPs -  Physician Assistants and Nurse Practitioners) who all work together to provide you with the care you need, when you need it.  Special Instructions:  . Stay safe, wash your hands for at least 20 seconds and wear a mask when needed.  . It was good to talk with you today.  . Thanks to both of you!  [place]  [address]   Call the [facility] office at [phone] if you have any questions, problems or concerns.

## 2020-07-06 ENCOUNTER — Telehealth: Payer: Self-pay | Admitting: *Deleted

## 2020-07-06 NOTE — Telephone Encounter (Signed)
Pt's wife per [facility]) is aware pt will now be seeing Dr.Crenshaw in [facility], per pt request.  Confirmed time and date.

## 2020-07-06 NOTE — Telephone Encounter (Signed)
S/w pt's spouse to give appt for the [facility] office.  Will call back in the dentist.

## 2020-08-05 DIAGNOSIS — E538 Deficiency of other specified B group vitamins: Secondary | ICD-10-CM | POA: Diagnosis not present

## 2020-08-20 DIAGNOSIS — M545 Low back pain, unspecified: Secondary | ICD-10-CM | POA: Diagnosis not present

## 2020-08-31 NOTE — Progress Notes (Signed)
HPI: Follow-up coronary artery disease.  This is my first time evaluating Patrick [name].  Cardiac catheterization July 2020 showed occluded LAD, circumflex and right coronary artery.  Saphenous vein graft to the right coronary artery was patent, sequential saphenous vein graft to the OM1 and distal circumflex was patent and the LIMA to the diagonal and mid LAD was patent as well.  Note there was severe stenosis in the saphenous vein graft to the distal right coronary artery which was treated with a drug-eluting stent.  ABIs January 2021 normal.  Echocardiogram February 2021 showed normal LV function, moderate left ventricular hypertrophy, grade 1 diastolic dysfunction, mild aortic stenosis with mean gradient 10 mmHg.  Monitor September 2021 showed sinus rhythm.  Nuclear study February 2021 showed ejection fraction 58% and no ischemia or infarction.  Has had difficulties with orthostasis treated with Florinef.  Based on previous notes he has had decline in overall health.  Since last seen he denies dyspnea, chest pain, palpitations or syncope.  Current Outpatient Medications  Medication Sig Dispense Refill  . allopurinol (ZYLOPRIM) 100 MG tablet Take 100 mg by mouth daily.    Marland Kitchen aspirin 81 MG tablet Take 81 mg by mouth daily.      Marland Kitchen atorvastatin (LIPITOR) 20 MG tablet Take 1 tablet (20 mg total) by mouth daily. 90 tablet 3  . donepezil (ARICEPT) 10 MG tablet Take 10 mg by mouth at bedtime.   0  . finasteride (PROSCAR) 5 MG tablet Take 5 mg by mouth daily.    . fludrocortisone (FLORINEF) 0.1 MG tablet Take 1 tablet (0.1 mg total) by mouth daily. 30 tablet 6  . levothyroxine (SYNTHROID) 112 MCG tablet Take 112 mcg by mouth daily.    Marland Kitchen LINZESS 72 MCG capsule Take 72 mcg by mouth as needed (constipation).    . mirtazapine (REMERON) 15 MG tablet SMARTSIG:1 Pill By Mouth Every Night    . nitroGLYCERIN (NITROSTAT) 0.4 MG SL tablet Place 1 tablet (0.4 mg total) under the tongue every 5 (five) minutes as  needed. 75 tablet 2  . pantoprazole (PROTONIX) 40 MG tablet Take 1 tablet (40 mg total) by mouth daily before breakfast. 90 tablet 1  . sertraline (ZOLOFT) 100 MG tablet Take 100 mg by mouth daily.    . tamsulosin (FLOMAX) 0.4 MG CAPS capsule Take 0.4 mg by mouth daily after supper.     . Vitamin D, Ergocalciferol, (DRISDOL) 50000 units CAPS capsule Take 50,000 Units by mouth once a week.  3   No current facility-administered medications for this visit.     Past Medical History:  Diagnosis Date  . Bilateral foot-drop 12/17/2019  . BPH (benign prostatic hypertrophy)   . Diverticulosis   . ED (erectile dysfunction)   . Gait abnormality 12/17/2019  . GERD (gastroesophageal reflux disease)   . Gout   . Hepatitis    hx hepatitis after mono as teenager  . History of kidney stones   . History of skin cancer   . Hyperlipidemia   . Hypertension   . Hypothyroidism   . IHD (ischemic heart disease)    Remote CABG in 1997  . Memory disorder 12/17/2019  . MI, acute, non ST segment elevation ([place]) 2010   s/p cath with occluded SVG to PD that fills by collaterals and the remainder of his revasculsarization is satisfactory. "the first time they did the stent , the second time they did the graft 8 vessels"  . Nocturia   . Thrombocytopenia (  [place]) 11/19/2018  . Tremor, essential 12/17/2019  . Urinary frequency     Past Surgical History:  Procedure Laterality Date  . APPENDECTOMY    . BALLOON DILATION N/A 03/25/2019   Procedure: BALLOON DILATION;  Surgeon: Otis Brace, MD;  [facility];  Service: Gastroenterology;  Laterality: N/A;  . CARDIAC CATHETERIZATION  09/08/2008   NORMAL. EF 60%; Occluded SVG to PD that fills by collaterals and the remainder of his revascularization is satisfactory. he is managed medically.   . CORONARY ARTERY BYPASS GRAFT  1997   CABG x 8  . CORONARY STENT INTERVENTION N/A 11/18/2018   Procedure: CORONARY STENT INTERVENTION;  Surgeon: Sherren Mocha, MD;   Location: [place] [facility];  Service: Cardiovascular;  Laterality: N/A;  . CYSTOSCOPY WITH INSERTION OF UROLIFT N/A 03/19/2015   Procedure: CYSTOSCOPY WITH INSERTION OF FOUR UROLIFTS;  Surgeon: Carolan Clines, MD;  [facility];  Service: Urology;  Laterality: N/A;  . ESOPHAGOGASTRODUODENOSCOPY (EGD) WITH PROPOFOL N/A 02/12/2017   Procedure: ESOPHAGOGASTRODUODENOSCOPY (EGD) WITH PROPOFOL;  Surgeon: Otis Brace, MD;  [facility];  Service: Gastroenterology;  Laterality: N/A;  . ESOPHAGOGASTRODUODENOSCOPY (EGD) WITH PROPOFOL N/A 07/13/2018   Procedure: ESOPHAGOGASTRODUODENOSCOPY (EGD) WITH PROPOFOL;  Surgeon: Clarene Essex, MD;  [facility];  Service: Endoscopy;  Laterality: N/A;  . ESOPHAGOGASTRODUODENOSCOPY (EGD) WITH PROPOFOL N/A 03/25/2019   Procedure: ESOPHAGOGASTRODUODENOSCOPY (EGD) WITH PROPOFOL;  Surgeon: Otis Brace, MD;  [facility];  Service: Gastroenterology;  Laterality: N/A;  . FOREIGN BODY REMOVAL N/A 07/13/2018   Procedure: FOREIGN BODY REMOVAL;  Surgeon: Clarene Essex, MD;  [facility];  Service: Endoscopy;  Laterality: N/A;  . LEFT HEART CATH AND CORS/GRAFTS ANGIOGRAPHY N/A 11/18/2018   Procedure: LEFT HEART CATH AND CORS/GRAFTS ANGIOGRAPHY;  Surgeon: Sherren Mocha, MD;  Location: [place] [facility];  Service: Cardiovascular;  Laterality: N/A;    Social History   Socioeconomic History  . Marital status: Married    Spouse name: Patrick [name]   . Number of children: Not on file  . Years of education: Not on file  . Highest education level: 12th grade  Occupational History  . Occupation: Retired    Fish farm manager: RETIRED    Comment: was in [employer]  Tobacco Use  . Smoking status: Former Smoker    Packs/day: 1.00    Years: 15.00    Pack years: 15.00    Types: Cigarettes    Quit date: 05/15/1958    Years since quitting: 62.3  . Smokeless tobacco: Never Used  Vaping Use  . Vaping Use: Never used  Substance and Sexual Activity   . Alcohol use: Yes    Comment: occasional beer  . Drug use: No  . Sexual activity: Yes  Other Topics Concern  . Not on file  Social History Narrative   Mr Patrick [name] is a 87 year old retired male who lives at home with his wife, Patrick [name].     Social Determinants of Health   Financial Resource Strain: Not on file  Food Insecurity: Not on file  Transportation Needs: Not on file  Physical Activity: Not on file  Stress: Not on file  Social Connections: Not on file  Intimate Partner Violence: Not on file    Family History  Problem Relation Age of Onset  . Heart failure Mother 24    ROS: He has gait unsteadiness due to disequilibrium but no fevers or chills, productive cough, hemoptysis, dysphasia, odynophagia, melena, hematochezia, dysuria, hematuria, rash, seizure activity, orthopnea, PND, pedal edema, claudication. Remaining systems  are negative.  Physical Exam: Well-developed well-nourished in no acute distress.  Skin is warm and dry.  HEENT is normal.  Neck is supple.  Chest is clear to auscultation with normal expansion.  Cardiovascular exam is regular rate and rhythm.  Abdominal exam nontender or distended. No masses palpated. Extremities show no edema. neuro grossly intact  ECG-sinus bradycardia at a rate of 59, inferior lateral infarct.  Personally reviewed  A/P  1 coronary artery disease-plan to continue medical therapy with aspirin and statin.  Note approximately 20 minutes spent reviewing previous records prior to patient arrival.  2 hypertension-blood pressure is controlled.  We will allow him to run somewhat higher given history of orthostasis.  3 hyperlipidemia-continue statin.  Check lipids and liver.  4 history of mild aortic stenosis-S2 is not diminished on examination.  He will need follow-up echoes in the future.  5 orthostatic hypotension-continue Florinef.  Check bmet.  Kirk Ruths, MD

## 2020-09-08 ENCOUNTER — Other Ambulatory Visit: Payer: Self-pay

## 2020-09-08 ENCOUNTER — Ambulatory Visit (INDEPENDENT_AMBULATORY_CARE_PROVIDER_SITE_OTHER): Payer: Medicare Other | Admitting: Cardiology

## 2020-09-08 ENCOUNTER — Encounter: Payer: Self-pay | Admitting: Cardiology

## 2020-09-08 VITALS — BP 124/60 | HR 59 | Ht 72.0 in | Wt 159.8 lb

## 2020-09-08 DIAGNOSIS — I2510 Atherosclerotic heart disease of native coronary artery without angina pectoris: Secondary | ICD-10-CM | POA: Diagnosis not present

## 2020-09-08 DIAGNOSIS — E782 Mixed hyperlipidemia: Secondary | ICD-10-CM | POA: Diagnosis not present

## 2020-09-08 DIAGNOSIS — I10 Essential (primary) hypertension: Secondary | ICD-10-CM

## 2020-09-08 NOTE — Patient Instructions (Signed)
  Lab Work: ° °Your physician recommends that you return for lab work FASTING ° °If you have labs (blood work) drawn today and your tests are completely normal, you will receive your results only by: °MyChart Message (if you have MyChart) OR °A paper copy in the mail °If you have any lab test that is abnormal or we need to change your treatment, we will call you to review the results. ° ° °Follow-Up: °At [facility], you and your health needs are our priority.  As part of our continuing mission to provide you with exceptional heart care, we have created designated Provider Care Teams.  These Care Teams include your primary Cardiologist (physician) and Advanced Practice Providers (APPs -  Physician Assistants and Nurse Practitioners) who all work together to provide you with the care you need, when you need it. ° °We recommend signing up for the patient portal called "MyChart".  Sign up information is provided on this After Visit Summary.  MyChart is used to connect with patients for Virtual Visits (Telemedicine).  Patients are able to view lab/test results, encounter notes, upcoming appointments, etc.  Non-urgent messages can be sent to your provider as well.   °To learn more about what you can do with MyChart, go to [email or web address].   ° °Your next appointment:   °6 month(s) ° °The format for your next appointment:   °In Person ° °Provider:   °Brian Crenshaw, MD   ° ° °

## 2020-10-19 ENCOUNTER — Encounter: Payer: Self-pay | Admitting: *Deleted

## 2020-10-26 DIAGNOSIS — L570 Actinic keratosis: Secondary | ICD-10-CM | POA: Diagnosis not present

## 2020-10-26 DIAGNOSIS — Z85828 Personal history of other malignant neoplasm of skin: Secondary | ICD-10-CM | POA: Diagnosis not present

## 2020-10-26 DIAGNOSIS — E538 Deficiency of other specified B group vitamins: Secondary | ICD-10-CM | POA: Diagnosis not present

## 2020-10-26 DIAGNOSIS — L720 Epidermal cyst: Secondary | ICD-10-CM | POA: Diagnosis not present

## 2020-11-01 DIAGNOSIS — I2510 Atherosclerotic heart disease of native coronary artery without angina pectoris: Secondary | ICD-10-CM | POA: Diagnosis not present

## 2020-11-01 DIAGNOSIS — I10 Essential (primary) hypertension: Secondary | ICD-10-CM | POA: Diagnosis not present

## 2020-11-01 DIAGNOSIS — E782 Mixed hyperlipidemia: Secondary | ICD-10-CM | POA: Diagnosis not present

## 2020-11-02 ENCOUNTER — Telehealth: Payer: Self-pay | Admitting: *Deleted

## 2020-11-02 DIAGNOSIS — E782 Mixed hyperlipidemia: Secondary | ICD-10-CM

## 2020-11-02 LAB — BASIC METABOLIC PANEL
BUN/Creatinine Ratio: 15 (ref 10–24)
BUN: 21 mg/dL (ref 10–36)
CO2: 23 mmol/L (ref 20–29)
Calcium: 9 mg/dL (ref 8.6–10.2)
Chloride: 102 mmol/L (ref 96–106)
Creatinine, Ser: 1.42 mg/dL — ABNORMAL HIGH (ref 0.76–1.27)
Glucose: 90 mg/dL (ref 65–99)
Potassium: 4.3 mmol/L (ref 3.5–5.2)
Sodium: 139 mmol/L (ref 134–144)
eGFR: 47 mL/min/{1.73_m2} — ABNORMAL LOW (ref >59)

## 2020-11-02 LAB — HEPATIC FUNCTION PANEL
ALT: 12 IU/L (ref 0–44)
AST: 19 IU/L (ref 0–40)
Albumin: 5.1 g/dL — ABNORMAL HIGH (ref 3.5–4.6)
Alkaline Phosphatase: 78 IU/L (ref 44–121)
Bilirubin Total: 0.6 mg/dL (ref 0.0–1.2)
Bilirubin, Direct: 0.13 mg/dL (ref 0.00–0.40)
Total Protein: 7.4 g/dL (ref 6.0–8.5)

## 2020-11-02 LAB — LIPID PANEL
Chol/HDL Ratio: 5.7 ratio — ABNORMAL HIGH (ref 0.0–5.0)
Cholesterol, Total: 238 mg/dL — ABNORMAL HIGH (ref 100–199)
HDL: 42 mg/dL (ref >39)
LDL Chol Calc (NIH): 164 mg/dL — ABNORMAL HIGH (ref 0–99)
Triglycerides: 176 mg/dL — ABNORMAL HIGH (ref 0–149)
VLDL Cholesterol Cal: 32 mg/dL (ref 5–40)

## 2020-11-02 MED ORDER — EZETIMIBE 10 MG PO TABS: 10.0000 mg | ORAL_TABLET | Freq: Every day | ORAL | 3 refills | Status: DC

## 2020-11-02 NOTE — Telephone Encounter (Addendum)
-----   Message from Lelon Perla, MD sent at 11/02/2020  7:32 AM EDT ----- Add zetia 10 mg daily; make sure he is taking lipitor; lipids and liver 12 weeks [place] with pt wife, Aware of dr Jacalyn Lefevre recommendations.  New script sent to the pharmacy  Lab orders mailed to the pt

## 2020-11-25 DIAGNOSIS — I10 Essential (primary) hypertension: Secondary | ICD-10-CM | POA: Diagnosis not present

## 2020-11-25 DIAGNOSIS — R509 Fever, unspecified: Secondary | ICD-10-CM | POA: Diagnosis not present

## 2020-11-25 DIAGNOSIS — R5383 Other fatigue: Secondary | ICD-10-CM | POA: Diagnosis not present

## 2020-12-01 DIAGNOSIS — Z85828 Personal history of other malignant neoplasm of skin: Secondary | ICD-10-CM | POA: Diagnosis not present

## 2020-12-01 DIAGNOSIS — L570 Actinic keratosis: Secondary | ICD-10-CM | POA: Diagnosis not present

## 2020-12-01 DIAGNOSIS — D485 Neoplasm of uncertain behavior of skin: Secondary | ICD-10-CM | POA: Diagnosis not present

## 2020-12-01 DIAGNOSIS — C44329 Squamous cell carcinoma of skin of other parts of face: Secondary | ICD-10-CM | POA: Diagnosis not present

## 2020-12-06 DIAGNOSIS — M48062 Spinal stenosis, lumbar region with neurogenic claudication: Secondary | ICD-10-CM | POA: Diagnosis not present

## 2020-12-06 DIAGNOSIS — M4316 Spondylolisthesis, lumbar region: Secondary | ICD-10-CM | POA: Diagnosis not present

## 2020-12-06 DIAGNOSIS — M545 Low back pain, unspecified: Secondary | ICD-10-CM | POA: Diagnosis not present

## 2020-12-06 DIAGNOSIS — M5136 Other intervertebral disc degeneration, lumbar region: Secondary | ICD-10-CM | POA: Diagnosis not present

## 2020-12-22 DIAGNOSIS — M5136 Other intervertebral disc degeneration, lumbar region: Secondary | ICD-10-CM | POA: Diagnosis not present

## 2020-12-22 DIAGNOSIS — M5186 Other intervertebral disc disorders, lumbar region: Secondary | ICD-10-CM | POA: Diagnosis not present

## 2020-12-22 DIAGNOSIS — M545 Low back pain, unspecified: Secondary | ICD-10-CM | POA: Diagnosis not present

## 2020-12-22 DIAGNOSIS — M4317 Spondylolisthesis, lumbosacral region: Secondary | ICD-10-CM | POA: Diagnosis not present

## 2020-12-29 DIAGNOSIS — E538 Deficiency of other specified B group vitamins: Secondary | ICD-10-CM | POA: Diagnosis not present

## 2020-12-29 DIAGNOSIS — Z85828 Personal history of other malignant neoplasm of skin: Secondary | ICD-10-CM | POA: Diagnosis not present

## 2020-12-29 DIAGNOSIS — E785 Hyperlipidemia, unspecified: Secondary | ICD-10-CM | POA: Diagnosis not present

## 2020-12-29 DIAGNOSIS — Z79899 Other long term (current) drug therapy: Secondary | ICD-10-CM | POA: Diagnosis not present

## 2020-12-29 DIAGNOSIS — M109 Gout, unspecified: Secondary | ICD-10-CM | POA: Diagnosis not present

## 2020-12-29 DIAGNOSIS — L570 Actinic keratosis: Secondary | ICD-10-CM | POA: Diagnosis not present

## 2020-12-29 DIAGNOSIS — M8580 Other specified disorders of bone density and structure, unspecified site: Secondary | ICD-10-CM | POA: Diagnosis not present

## 2020-12-29 DIAGNOSIS — Z125 Encounter for screening for malignant neoplasm of prostate: Secondary | ICD-10-CM | POA: Diagnosis not present

## 2020-12-29 DIAGNOSIS — I10 Essential (primary) hypertension: Secondary | ICD-10-CM | POA: Diagnosis not present

## 2020-12-29 DIAGNOSIS — E039 Hypothyroidism, unspecified: Secondary | ICD-10-CM | POA: Diagnosis not present

## 2020-12-30 DIAGNOSIS — M4316 Spondylolisthesis, lumbar region: Secondary | ICD-10-CM | POA: Diagnosis not present

## 2020-12-30 DIAGNOSIS — M545 Low back pain, unspecified: Secondary | ICD-10-CM | POA: Diagnosis not present

## 2020-12-30 DIAGNOSIS — M47816 Spondylosis without myelopathy or radiculopathy, lumbar region: Secondary | ICD-10-CM | POA: Diagnosis not present

## 2021-01-05 DIAGNOSIS — E538 Deficiency of other specified B group vitamins: Secondary | ICD-10-CM | POA: Diagnosis not present

## 2021-01-05 DIAGNOSIS — R7301 Impaired fasting glucose: Secondary | ICD-10-CM | POA: Diagnosis not present

## 2021-01-05 DIAGNOSIS — E039 Hypothyroidism, unspecified: Secondary | ICD-10-CM | POA: Diagnosis not present

## 2021-01-05 DIAGNOSIS — I10 Essential (primary) hypertension: Secondary | ICD-10-CM | POA: Diagnosis not present

## 2021-01-05 DIAGNOSIS — Z0000 Encounter for general adult medical examination without abnormal findings: Secondary | ICD-10-CM | POA: Diagnosis not present

## 2021-01-05 DIAGNOSIS — R82998 Other abnormal findings in urine: Secondary | ICD-10-CM | POA: Diagnosis not present

## 2021-01-05 DIAGNOSIS — I700 Atherosclerosis of aorta: Secondary | ICD-10-CM | POA: Diagnosis not present

## 2021-01-05 DIAGNOSIS — I2510 Atherosclerotic heart disease of native coronary artery without angina pectoris: Secondary | ICD-10-CM | POA: Diagnosis not present

## 2021-01-05 DIAGNOSIS — Z1331 Encounter for screening for depression: Secondary | ICD-10-CM | POA: Diagnosis not present

## 2021-01-05 DIAGNOSIS — G629 Polyneuropathy, unspecified: Secondary | ICD-10-CM | POA: Diagnosis not present

## 2021-01-05 DIAGNOSIS — N1831 Chronic kidney disease, stage 3a: Secondary | ICD-10-CM | POA: Diagnosis not present

## 2021-01-05 DIAGNOSIS — Z66 Do not resuscitate: Secondary | ICD-10-CM | POA: Diagnosis not present

## 2021-01-05 DIAGNOSIS — E785 Hyperlipidemia, unspecified: Secondary | ICD-10-CM | POA: Diagnosis not present

## 2021-01-05 DIAGNOSIS — Z1339 Encounter for screening examination for other mental health and behavioral disorders: Secondary | ICD-10-CM | POA: Diagnosis not present

## 2021-01-05 DIAGNOSIS — F0280 Dementia in other diseases classified elsewhere without behavioral disturbance: Secondary | ICD-10-CM | POA: Diagnosis not present

## 2021-01-12 DIAGNOSIS — M47816 Spondylosis without myelopathy or radiculopathy, lumbar region: Secondary | ICD-10-CM | POA: Diagnosis not present

## 2021-01-12 DIAGNOSIS — Z79891 Long term (current) use of opiate analgesic: Secondary | ICD-10-CM | POA: Diagnosis not present

## 2021-01-12 DIAGNOSIS — Z79899 Other long term (current) drug therapy: Secondary | ICD-10-CM | POA: Diagnosis not present

## 2021-01-12 DIAGNOSIS — G894 Chronic pain syndrome: Secondary | ICD-10-CM | POA: Diagnosis not present

## 2021-02-09 ENCOUNTER — Encounter: Payer: Self-pay | Admitting: *Deleted

## 2021-02-16 DIAGNOSIS — M47816 Spondylosis without myelopathy or radiculopathy, lumbar region: Secondary | ICD-10-CM | POA: Diagnosis not present

## 2021-02-17 DIAGNOSIS — C4441 Basal cell carcinoma of skin of scalp and neck: Secondary | ICD-10-CM | POA: Diagnosis not present

## 2021-02-17 DIAGNOSIS — L821 Other seborrheic keratosis: Secondary | ICD-10-CM | POA: Diagnosis not present

## 2021-02-17 DIAGNOSIS — L570 Actinic keratosis: Secondary | ICD-10-CM | POA: Diagnosis not present

## 2021-02-17 DIAGNOSIS — D485 Neoplasm of uncertain behavior of skin: Secondary | ICD-10-CM | POA: Diagnosis not present

## 2021-02-17 DIAGNOSIS — Z85828 Personal history of other malignant neoplasm of skin: Secondary | ICD-10-CM | POA: Diagnosis not present

## 2021-02-21 DIAGNOSIS — H04123 Dry eye syndrome of bilateral lacrimal glands: Secondary | ICD-10-CM | POA: Diagnosis not present

## 2021-03-02 DIAGNOSIS — E782 Mixed hyperlipidemia: Secondary | ICD-10-CM | POA: Diagnosis not present

## 2021-03-03 ENCOUNTER — Encounter: Payer: Self-pay | Admitting: *Deleted

## 2021-03-03 LAB — HEPATIC FUNCTION PANEL
ALT: 11 IU/L (ref 0–44)
AST: 15 IU/L (ref 0–40)
Albumin: 4.5 g/dL (ref 3.5–4.6)
Alkaline Phosphatase: 79 IU/L (ref 44–121)
Bilirubin Total: 0.5 mg/dL (ref 0.0–1.2)
Bilirubin, Direct: 0.15 mg/dL (ref 0.00–0.40)
Total Protein: 6.9 g/dL (ref 6.0–8.5)

## 2021-03-03 LAB — LIPID PANEL
Chol/HDL Ratio: 3.1 ratio (ref 0.0–5.0)
Cholesterol, Total: 133 mg/dL (ref 100–199)
HDL: 43 mg/dL (ref >39)
LDL Chol Calc (NIH): 72 mg/dL (ref 0–99)
Triglycerides: 92 mg/dL (ref 0–149)
VLDL Cholesterol Cal: 18 mg/dL (ref 5–40)

## 2021-03-07 DIAGNOSIS — M47816 Spondylosis without myelopathy or radiculopathy, lumbar region: Secondary | ICD-10-CM | POA: Diagnosis not present

## 2021-03-08 NOTE — Progress Notes (Signed)
HPI: Follow-up coronary artery disease.  Cardiac catheterization July 2020 showed occluded LAD, circumflex and right coronary artery.  Saphenous vein graft to the right coronary artery was patent, sequential saphenous vein graft to the OM1 and distal circumflex was patent and the LIMA to the diagonal and mid LAD was patent as well.  Note there was severe stenosis in the saphenous vein graft to the distal right coronary artery which was treated with a drug-eluting stent.  ABIs January 2021 normal.  Echocardiogram February 2021 showed normal LV function, moderate left ventricular hypertrophy, grade 1 diastolic dysfunction, mild aortic stenosis with mean gradient 10 mmHg.  Monitor September 2021 showed sinus rhythm.  Nuclear study February 2021 showed ejection fraction 58% and no ischemia or infarction.  Has had difficulties with orthostasis treated with Florinef.  Based on previous notes he has had decline in overall health.  Since last seen he has dyspnea with more prolonged activities but no orthopnea, PND, pedal edema, chest pain or syncope.  He does have fatigue.  Current Outpatient Medications  Medication Sig Dispense Refill   allopurinol (ZYLOPRIM) 100 MG tablet Take 100 mg by mouth daily.     aspirin 81 MG tablet Take 81 mg by mouth daily.       atorvastatin (LIPITOR) 20 MG tablet Take 1 tablet (20 mg total) by mouth daily. 90 tablet 3   donepezil (ARICEPT) 10 MG tablet Take 10 mg by mouth at bedtime.   0   DULoxetine (CYMBALTA) 30 MG capsule Take 30 mg by mouth daily.     ezetimibe (ZETIA) 10 MG tablet Take 1 tablet (10 mg total) by mouth daily. 90 tablet 3   finasteride (PROSCAR) 5 MG tablet Take 5 mg by mouth daily.     fludrocortisone (FLORINEF) 0.1 MG tablet Take 1 tablet (0.1 mg total) by mouth daily. 30 tablet 6   levothyroxine (SYNTHROID) 112 MCG tablet Take 112 mcg by mouth daily.     lidocaine (LIDODERM) 5 % Place 1 patch onto the skin daily.     LINZESS 72 MCG capsule Take 72  mcg by mouth as needed (constipation).     memantine (NAMENDA) 5 MG tablet Take 5 mg by mouth daily.     mirtazapine (REMERON) 15 MG tablet SMARTSIG:1 Pill By Mouth Every Night     nitroGLYCERIN (NITROSTAT) 0.4 MG SL tablet Place 1 tablet (0.4 mg total) under the tongue every 5 (five) minutes as needed. 75 tablet 2   pantoprazole (PROTONIX) 40 MG tablet Take 1 tablet (40 mg total) by mouth daily before breakfast. 90 tablet 1   sertraline (ZOLOFT) 100 MG tablet Take 100 mg by mouth daily.     tamsulosin (FLOMAX) 0.4 MG CAPS capsule Take 0.4 mg by mouth daily after supper.      Vitamin D, Ergocalciferol, (DRISDOL) 50000 units CAPS capsule Take 50,000 Units by mouth once a week.  3   No current facility-administered medications for this visit.     Past Medical History:  Diagnosis Date   Bilateral foot-drop 12/17/2019   BPH (benign prostatic hypertrophy)    Diverticulosis    ED (erectile dysfunction)    Gait abnormality 12/17/2019   GERD (gastroesophageal reflux disease)    Gout    Hepatitis    hx hepatitis after mono as teenager   History of kidney stones    History of skin cancer    Hyperlipidemia    Hypertension    Hypothyroidism    IHD (ischemic  heart disease)    Remote CABG in 1997   Memory disorder 12/17/2019   MI, acute, non ST segment elevation ([place]) 2010   s/p cath with occluded SVG to PD that fills by collaterals and the remainder of his revasculsarization is satisfactory. "the first time they did the stent , the second time they did the graft 8 vessels"   Nocturia    Thrombocytopenia ([place]) 11/19/2018   Tremor, essential 12/17/2019   Urinary frequency     Past Surgical History:  Procedure Laterality Date   APPENDECTOMY     BALLOON DILATION N/A 03/25/2019   Procedure: BALLOON DILATION;  Surgeon: Otis Brace, MD;  Location: [facility];  Service: Gastroenterology;  Laterality: N/A;   CARDIAC CATHETERIZATION  09/08/2008   NORMAL. EF 60%; Occluded SVG to PD that fills by  collaterals and the remainder of his revascularization is satisfactory. he is managed medically.    CORONARY ARTERY BYPASS GRAFT  1997   CABG x 8   CORONARY STENT INTERVENTION N/A 11/18/2018   Procedure: CORONARY STENT INTERVENTION;  Surgeon: Sherren Mocha, MD;  Location: [facility];  Service: Cardiovascular;  Laterality: N/A;   CYSTOSCOPY WITH INSERTION OF UROLIFT N/A 03/19/2015   Procedure: CYSTOSCOPY WITH INSERTION OF FOUR UROLIFTS;  Surgeon: Carolan Clines, MD;  Location: [facility];  Service: Urology;  Laterality: N/A;   ESOPHAGOGASTRODUODENOSCOPY (EGD) WITH PROPOFOL N/A 02/12/2017   Procedure: ESOPHAGOGASTRODUODENOSCOPY (EGD) WITH PROPOFOL;  Surgeon: Otis Brace, MD;  Location: [facility];  Service: Gastroenterology;  Laterality: N/A;   ESOPHAGOGASTRODUODENOSCOPY (EGD) WITH PROPOFOL N/A 07/13/2018   Procedure: ESOPHAGOGASTRODUODENOSCOPY (EGD) WITH PROPOFOL;  Surgeon: Clarene Essex, MD;  Location: [facility];  Service: Endoscopy;  Laterality: N/A;   ESOPHAGOGASTRODUODENOSCOPY (EGD) WITH PROPOFOL N/A 03/25/2019   Procedure: ESOPHAGOGASTRODUODENOSCOPY (EGD) WITH PROPOFOL;  Surgeon: Otis Brace, MD;  Location: [facility];  Service: Gastroenterology;  Laterality: N/A;   FOREIGN BODY REMOVAL N/A 07/13/2018   Procedure: FOREIGN BODY REMOVAL;  Surgeon: Clarene Essex, MD;  Location: [facility];  Service: Endoscopy;  Laterality: N/A;   LEFT HEART CATH AND CORS/GRAFTS ANGIOGRAPHY N/A 11/18/2018   Procedure: LEFT HEART CATH AND CORS/GRAFTS ANGIOGRAPHY;  Surgeon: Sherren Mocha, MD;  Location: [facility];  Service: Cardiovascular;  Laterality: N/A;    Social History   Socioeconomic History   Marital status: Married    Spouse name: [name]    Number of children: Not on file   Years of education: Not on file   Highest education level: 12th grade  Occupational History   Occupation: Retired    Fish farm manager: RETIRED    Comment: was in [employer]  Tobacco Use   Smoking status:  Former    Packs/day: 1.00    Years: 15.00    Pack years: 15.00    Types: Cigarettes    Quit date: 05/15/1958    Years since quitting: 62.8   Smokeless tobacco: Never  Vaping Use   Vaping Use: Never used  Substance and Sexual Activity   Alcohol use: Yes    Comment: occasional beer   Drug use: No   Sexual activity: Yes  Other Topics Concern   Not on file  Social History Narrative   Patrick [name] is a 87 year old retired male who lives at home with his wife, [name].     Social Determinants of Health   Financial Resource Strain: Not on file  Food Insecurity: Not on file  Transportation Needs: Not on file  Physical Activity: Not on file  Stress: Not  on file  Social Connections: Not on file  Intimate Partner Violence: Not on file    Family History  Problem Relation Age of Onset   Heart failure Mother 27    ROS: Peripheral neuropathy but no fevers or chills, productive cough, hemoptysis, dysphasia, odynophagia, melena, hematochezia, dysuria, hematuria, rash, seizure activity, orthopnea, PND, pedal edema, claudication. Remaining systems are negative.  Physical Exam: Well-developed well-nourished in no acute distress.  Skin is warm and dry.  HEENT is normal.  Neck is supple.  Chest is clear to auscultation with normal expansion.  Cardiovascular exam is regular rate and rhythm.  2/6 systolic murmur left sternal border. Abdominal exam nontender or distended. No masses palpated. Extremities show no edema. neuro grossly intact  A/P  1 coronary artery disease-plan to continue medical therapy with aspirin and statin.  Patient denies chest pain.  2 hypertension-patient's blood pressure is controlled today.  Continue present medications and follow-up.  Note we are allowing his blood pressure to run somewhat higher given his history of orthostasis.  3 hyperlipidemia-continue statin.  4 aortic stenosis-we will plan to repeat echocardiogram as he is describing some fatigue/dyspnea on  exertion.  5 orthostatic hypotension-we will continue Florinef.    6 fatigue-check hemoglobin and renal function.  Kirk Ruths, MD

## 2021-03-16 ENCOUNTER — Ambulatory Visit (INDEPENDENT_AMBULATORY_CARE_PROVIDER_SITE_OTHER): Payer: Medicare Other | Admitting: Cardiology

## 2021-03-16 ENCOUNTER — Other Ambulatory Visit: Payer: Self-pay

## 2021-03-16 ENCOUNTER — Encounter: Payer: Self-pay | Admitting: Cardiology

## 2021-03-16 VITALS — BP 134/50 | HR 68 | Ht 72.0 in | Wt 151.1 lb

## 2021-03-16 DIAGNOSIS — I2510 Atherosclerotic heart disease of native coronary artery without angina pectoris: Secondary | ICD-10-CM | POA: Diagnosis not present

## 2021-03-16 DIAGNOSIS — I10 Essential (primary) hypertension: Secondary | ICD-10-CM | POA: Diagnosis not present

## 2021-03-16 DIAGNOSIS — I350 Nonrheumatic aortic (valve) stenosis: Secondary | ICD-10-CM | POA: Diagnosis not present

## 2021-03-16 DIAGNOSIS — R5383 Other fatigue: Secondary | ICD-10-CM

## 2021-03-16 DIAGNOSIS — E782 Mixed hyperlipidemia: Secondary | ICD-10-CM | POA: Diagnosis not present

## 2021-03-16 NOTE — Patient Instructions (Signed)
  Lab Work:  Your physician recommends that you have lab work today  If you have labs (blood work) drawn today and your tests are completely normal, you will receive your results only by: [employer] (if you have MyChart) OR A paper copy in the mail If you have any lab test that is abnormal or we need to change your treatment, we will call you to review the results.   Testing/Procedures:  Your physician has requested that you have an echocardiogram. Echocardiography is a painless test that uses sound waves to create images of your heart. It provides your doctor with information about the size and shape of your heart and how well your heart's chambers and valves are working. This procedure takes approximately one hour. There are no restrictions for this procedure. IMAGING DEPARTMENT [facility]   Follow-Up: At [facility], you and your health needs are our priority.  As part of our continuing mission to provide you with exceptional heart care, we have created designated Provider Care Teams.  These Care Teams include your primary Cardiologist (physician) and Advanced Practice Providers (APPs -  Physician Assistants and Nurse Practitioners) who all work together to provide you with the care you need, when you need it.  We recommend signing up for the patient portal called "MyChart".  Sign up information is provided on this After Visit Summary.  MyChart is used to connect with patients for Virtual Visits (Telemedicine).  Patients are able to view lab/test results, encounter notes, upcoming appointments, etc.  Non-urgent messages can be sent to your provider as well.   To learn more about what you can do with MyChart, go to [email or web address].    Your next appointment:   6 month(s)  The format for your next appointment:   In Person  Provider:   Kirk Ruths, MD

## 2021-03-17 LAB — BASIC METABOLIC PANEL
BUN/Creatinine Ratio: 16 (ref 10–24)
BUN: 19 mg/dL (ref 10–36)
CO2: 24 mmol/L (ref 20–29)
Calcium: 9 mg/dL (ref 8.6–10.2)
Chloride: 100 mmol/L (ref 96–106)
Creatinine, Ser: 1.2 mg/dL (ref 0.76–1.27)
Glucose: 93 mg/dL (ref 70–99)
Potassium: 4.3 mmol/L (ref 3.5–5.2)
Sodium: 139 mmol/L (ref 134–144)
eGFR: 57 mL/min/{1.73_m2} — ABNORMAL LOW (ref >59)

## 2021-03-17 LAB — TSH: TSH: 0.152 u[IU]/mL — ABNORMAL LOW (ref 0.450–4.500)

## 2021-03-17 LAB — CBC
Hematocrit: 40 % (ref 37.5–51.0)
Hemoglobin: 13.3 g/dL (ref 13.0–17.7)
MCH: 31.5 pg (ref 26.6–33.0)
MCHC: 33.3 g/dL (ref 31.5–35.7)
MCV: 95 fL (ref 79–97)
Platelets: 102 10*3/uL — ABNORMAL LOW (ref 150–450)
RBC: 4.22 x10E6/uL (ref 4.14–5.80)
RDW: 12.8 % (ref 11.6–15.4)
WBC: 4.9 10*3/uL (ref 3.4–10.8)

## 2021-03-22 DIAGNOSIS — M47816 Spondylosis without myelopathy or radiculopathy, lumbar region: Secondary | ICD-10-CM | POA: Diagnosis not present

## 2021-04-04 DIAGNOSIS — D485 Neoplasm of uncertain behavior of skin: Secondary | ICD-10-CM | POA: Diagnosis not present

## 2021-04-04 DIAGNOSIS — C44319 Basal cell carcinoma of skin of other parts of face: Secondary | ICD-10-CM | POA: Diagnosis not present

## 2021-04-04 DIAGNOSIS — Z85828 Personal history of other malignant neoplasm of skin: Secondary | ICD-10-CM | POA: Diagnosis not present

## 2021-04-04 DIAGNOSIS — H61001 Unspecified perichondritis of right external ear: Secondary | ICD-10-CM | POA: Diagnosis not present

## 2021-04-04 DIAGNOSIS — L570 Actinic keratosis: Secondary | ICD-10-CM | POA: Diagnosis not present

## 2021-04-06 ENCOUNTER — Other Ambulatory Visit: Payer: Self-pay

## 2021-04-06 ENCOUNTER — Ambulatory Visit (HOSPITAL_BASED_OUTPATIENT_CLINIC_OR_DEPARTMENT_OTHER)
Admission: RE | Admit: 2021-04-06 | Discharge: 2021-04-06 | Disposition: A | Discharge status: Home / Self Care | Payer: Medicare Other | Source: Ambulatory Visit | Attending: Cardiology | Admitting: Cardiology

## 2021-04-06 ENCOUNTER — Encounter: Payer: Self-pay | Admitting: *Deleted

## 2021-04-06 DIAGNOSIS — I350 Nonrheumatic aortic (valve) stenosis: Secondary | ICD-10-CM | POA: Insufficient documentation

## 2021-04-06 LAB — ECHOCARDIOGRAM COMPLETE
AR max vel: 3.17 cm2
AV Area VTI: 3.02 cm2
AV Area mean vel: 2.93 cm2
AV Mean grad: 6 mmHg
AV Peak grad: 10.4 mmHg
Ao pk vel: 1.61 m/s
Area-P 1/2: 1.45 cm2
Calc EF: 72.3 %
MV VTI: 2.27 cm2
P 1/2 time: 442 msec
S' Lateral: 2.4 cm
Single Plane A2C EF: 70.6 %
Single Plane A4C EF: 72.9 %

## 2021-04-19 ENCOUNTER — Telehealth: Payer: Self-pay | Admitting: Cardiology

## 2021-04-19 NOTE — Telephone Encounter (Signed)
[name] has been made aware of results, per DPR.  Normal LV function Kirk Ruths

## 2021-04-19 NOTE — Telephone Encounter (Signed)
Wife of patient called. The patient has not heard anything in regards to the patient's recent Echo.   The wife would like someone to call her to discuss those results

## 2021-05-03 DIAGNOSIS — M47816 Spondylosis without myelopathy or radiculopathy, lumbar region: Secondary | ICD-10-CM | POA: Diagnosis not present

## 2021-05-17 DIAGNOSIS — M48062 Spinal stenosis, lumbar region with neurogenic claudication: Secondary | ICD-10-CM | POA: Diagnosis not present

## 2021-05-17 DIAGNOSIS — E039 Hypothyroidism, unspecified: Secondary | ICD-10-CM | POA: Diagnosis not present

## 2021-05-19 IMAGING — CT CT ABD-PELV W/ CM
2 of 5 series · 16 of 46 positions shown, 18 images · IV contrast (APPLIED)
Comparison: 02/19/2019

CLINICAL DATA: Abdominal pain, recent diverticulitis

EXAM:
CT ABDOMEN AND PELVIS WITH CONTRAST
TECHNIQUE: Multidetector CT imaging of the abdomen and pelvis was performed
using the standard protocol following bolus administration of
intravenous contrast.
CONTRAST:  100mL OMNIPAQUE IOHEXOL 300 MG/ML  SOLN

[Series 2: axial st · axial · 0.80mm/px · z∈[-468,-43]mm · 13 of 96 slices shown, 15 images]
[im 6/96  soft-tissue]
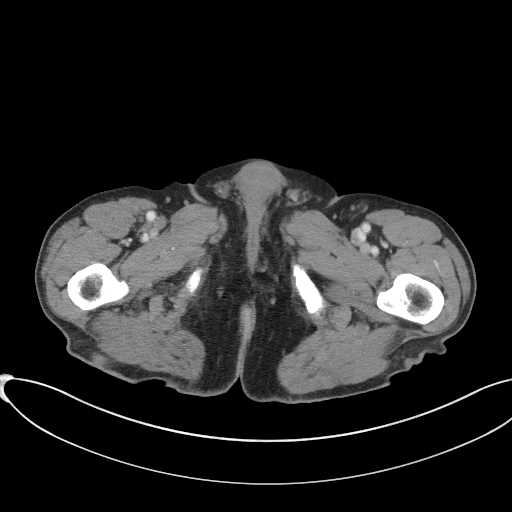
[im 6/96  bone]
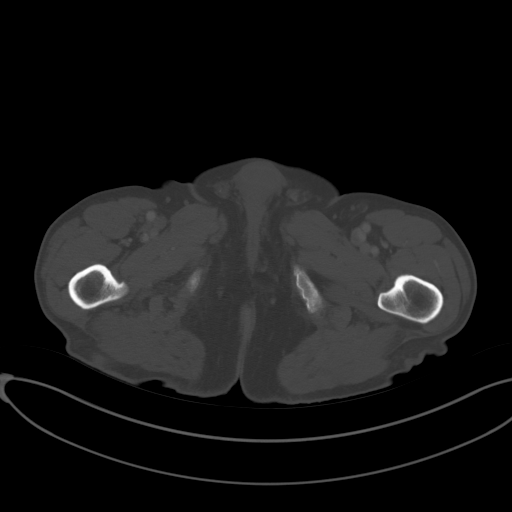
[im 16/96  soft-tissue]
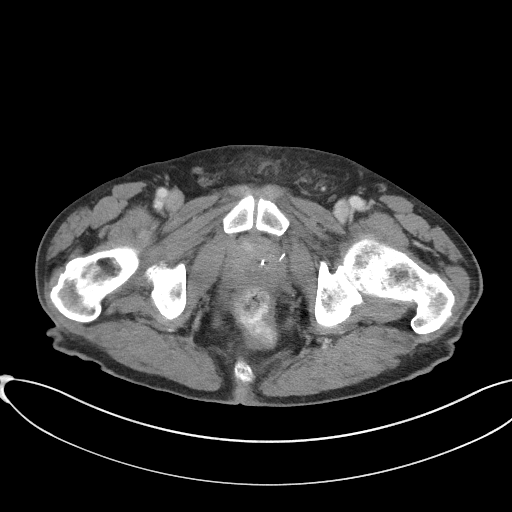
[im 21/96  soft-tissue]
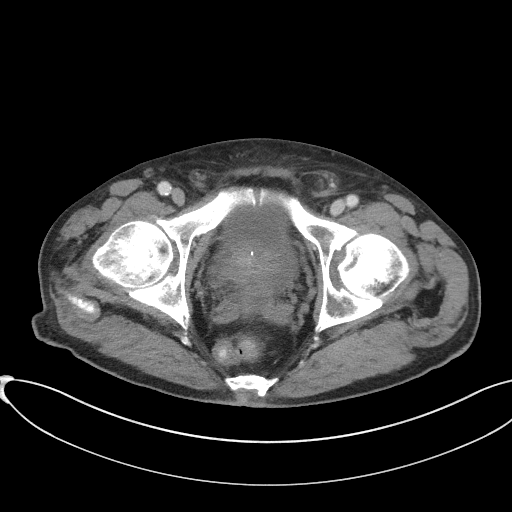
[im 26/96  soft-tissue]
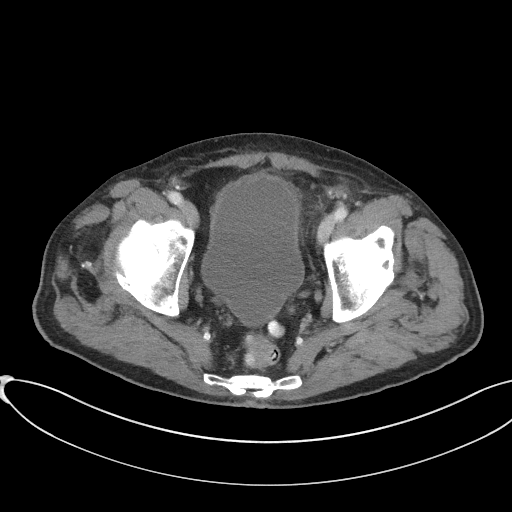
[im 36/96  soft-tissue]
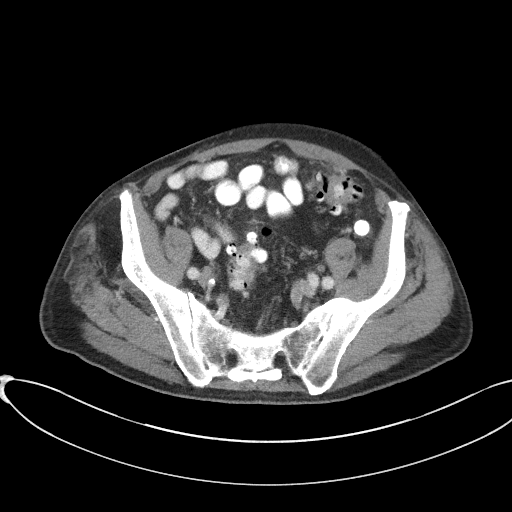
[im 41/96  soft-tissue]
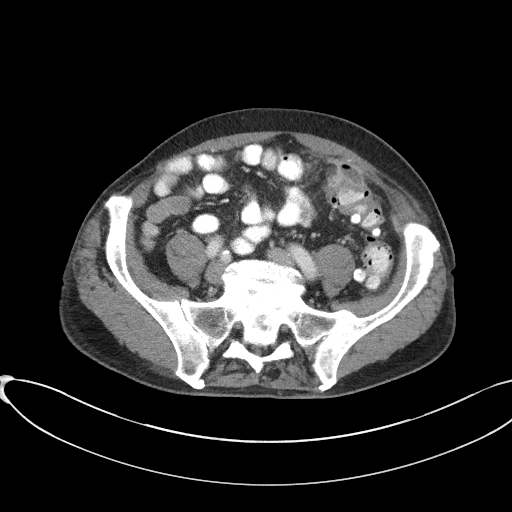
[im 51/96  soft-tissue]
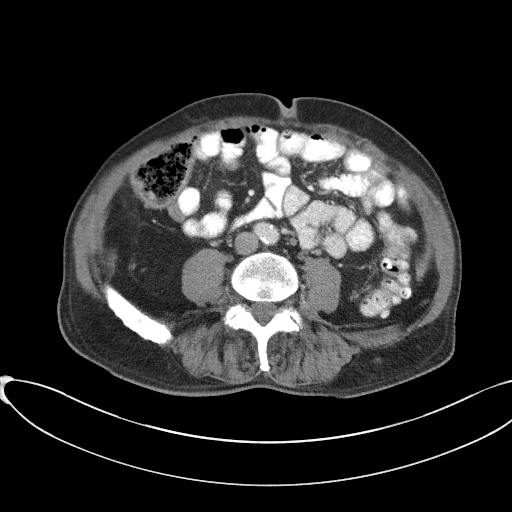
[im 56/96  soft-tissue]
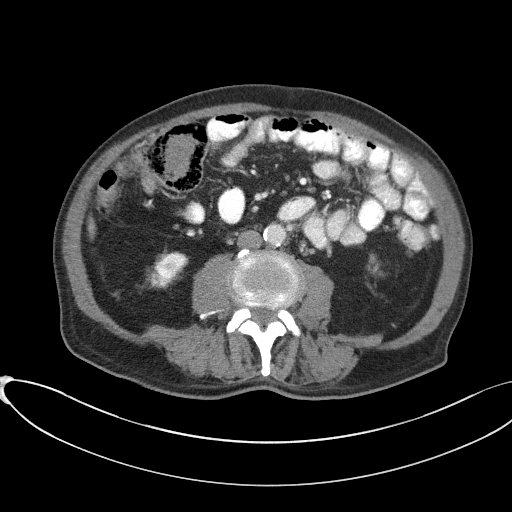
[im 61/96  soft-tissue]
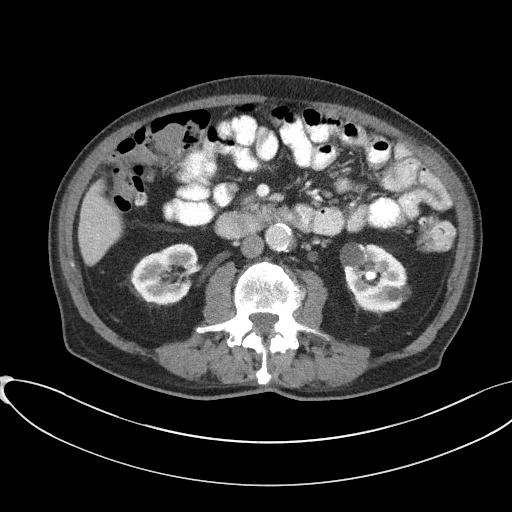
[im 61/96  bone]
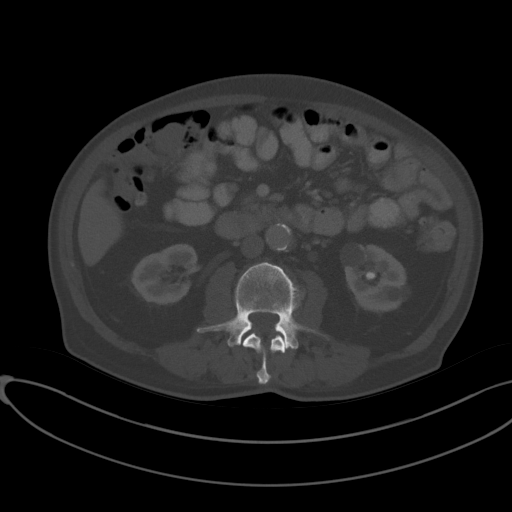
[im 71/96  soft-tissue]
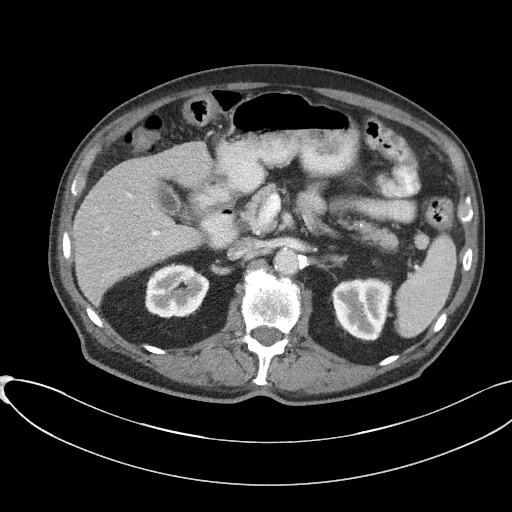
[im 76/96  soft-tissue]
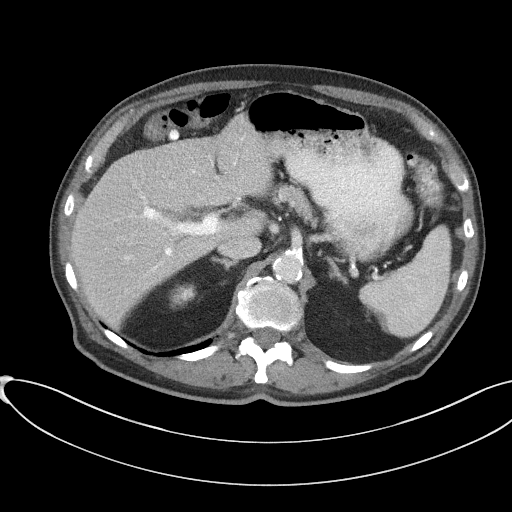
[im 81/96  soft-tissue]
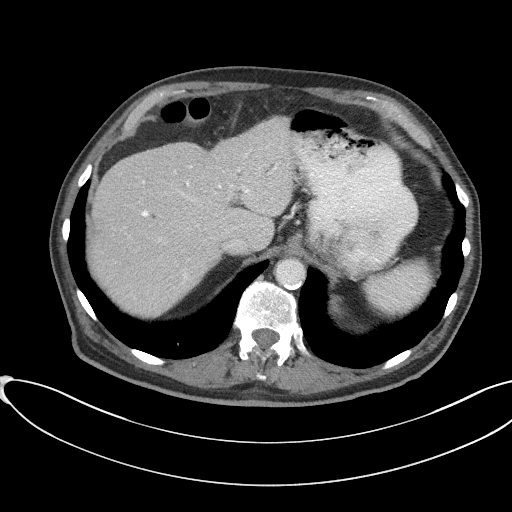
[im 91/96  soft-tissue]
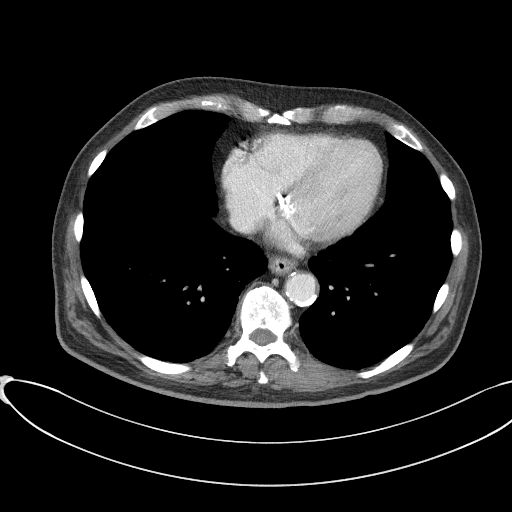

[Series 5: coronal st · coronal · 0.73mm/px · 3 of 102 slices shown]
[im 34/102  soft-tissue]
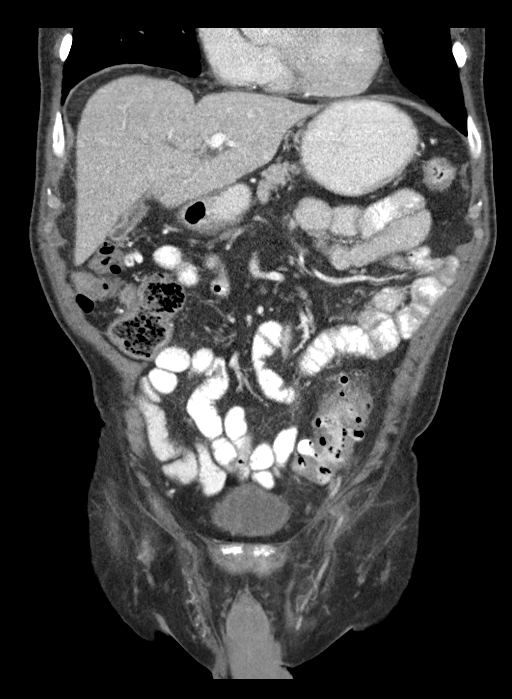
[im 45/102  soft-tissue]
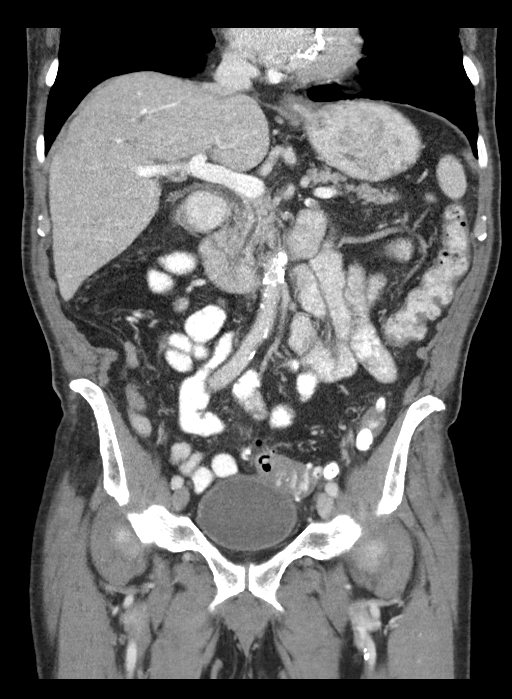
[im 57/102  soft-tissue]
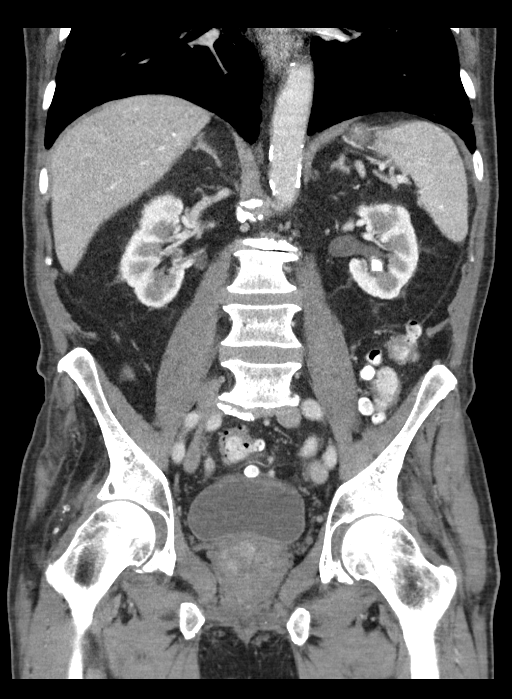

[16 of 46 positions shown; findings below may reference images not displayed]

FINDINGS: Lower chest: Lung bases are clear.

Hepatobiliary: Liver is within normal limits.

Gallbladder is unremarkable. No hepatic or extrahepatic duct
dilatation.

Pancreas: Within normal limits.

Spleen: Trace posterior perisplenic fluid (series 2/image 19),
unchanged.

Adrenals/Urinary Tract: Adrenal glands are within normal limits.

Small bilateral renal cysts, measuring up to 17 mm in the left lower
kidney. No hydronephrosis.

Bladder is within normal limits.

Stomach/Bowel: Stomach is notable for a small hiatal hernia.

No evidence of bowel obstruction.

Appendix is not discretely visualized.

Extensive left colonic diverticulosis. While the proximal sigmoid
colonic wall thickening/inflammatory changes are improved, there is
now a 14 x 26 mm fluid and gas collection adjacent to the proximal
sigmoid colon and beneath the left anterior abdominal wall (series
2/image 85), reflecting localized perforation.

No free air.

Vascular/Lymphatic: No evidence of abdominal aortic aneurysm.

Atherosclerotic calcifications of the abdominal aorta and branch
vessels.

No suspicious abdominopelvic lymphadenopathy.

Reproductive: Prostatomegaly.

Other: No abdominopelvic ascites.

Musculoskeletal: Grade 1 spondylolisthesis at L5-S1. Degenerative
changes of the visualized thoracolumbar spine.
IMPRESSION: Mild sigmoid diverticulitis, with overall improvement of wall
thickening/inflammatory changes, but now with associated 2.6 cm
pericolonic fluid and gas collection, reflecting localized
perforation.

No free air.

## 2021-05-26 DIAGNOSIS — C4442 Squamous cell carcinoma of skin of scalp and neck: Secondary | ICD-10-CM | POA: Diagnosis not present

## 2021-05-26 DIAGNOSIS — L570 Actinic keratosis: Secondary | ICD-10-CM | POA: Diagnosis not present

## 2021-05-26 DIAGNOSIS — Z85828 Personal history of other malignant neoplasm of skin: Secondary | ICD-10-CM | POA: Diagnosis not present

## 2021-05-26 DIAGNOSIS — D485 Neoplasm of uncertain behavior of skin: Secondary | ICD-10-CM | POA: Diagnosis not present

## 2021-05-26 DIAGNOSIS — M7130 Other bursal cyst, unspecified site: Secondary | ICD-10-CM | POA: Diagnosis not present

## 2021-05-27 DIAGNOSIS — E039 Hypothyroidism, unspecified: Secondary | ICD-10-CM | POA: Diagnosis not present

## 2021-05-27 DIAGNOSIS — G629 Polyneuropathy, unspecified: Secondary | ICD-10-CM | POA: Diagnosis not present

## 2021-05-27 DIAGNOSIS — R531 Weakness: Secondary | ICD-10-CM | POA: Diagnosis not present

## 2021-05-27 DIAGNOSIS — E538 Deficiency of other specified B group vitamins: Secondary | ICD-10-CM | POA: Diagnosis not present

## 2021-05-31 DIAGNOSIS — M47816 Spondylosis without myelopathy or radiculopathy, lumbar region: Secondary | ICD-10-CM | POA: Diagnosis not present

## 2021-05-31 DIAGNOSIS — M48062 Spinal stenosis, lumbar region with neurogenic claudication: Secondary | ICD-10-CM | POA: Diagnosis not present

## 2021-05-31 DIAGNOSIS — M545 Low back pain, unspecified: Secondary | ICD-10-CM | POA: Diagnosis not present

## 2021-05-31 DIAGNOSIS — M4316 Spondylolisthesis, lumbar region: Secondary | ICD-10-CM | POA: Diagnosis not present

## 2021-06-15 DIAGNOSIS — M48062 Spinal stenosis, lumbar region with neurogenic claudication: Secondary | ICD-10-CM | POA: Diagnosis not present

## 2021-06-28 ENCOUNTER — Emergency Department (HOSPITAL_COMMUNITY): Payer: Medicare Other | Admitting: Anesthesiology

## 2021-06-28 ENCOUNTER — Ambulatory Visit (HOSPITAL_BASED_OUTPATIENT_CLINIC_OR_DEPARTMENT_OTHER)
Admission: EM | Admit: 2021-06-28 | Discharge: 2021-06-28 | Disposition: A | Discharge status: Home / Self Care | Payer: Medicare Other | Attending: Emergency Medicine | Admitting: Emergency Medicine

## 2021-06-28 ENCOUNTER — Other Ambulatory Visit: Payer: Self-pay

## 2021-06-28 ENCOUNTER — Encounter (HOSPITAL_BASED_OUTPATIENT_CLINIC_OR_DEPARTMENT_OTHER): Payer: Self-pay

## 2021-06-28 ENCOUNTER — Encounter (HOSPITAL_COMMUNITY)
Admission: EM | Disposition: A | Discharge status: Home / Self Care | Payer: Self-pay | Source: Home / Self Care | Attending: Emergency Medicine

## 2021-06-28 ENCOUNTER — Emergency Department (EMERGENCY_DEPARTMENT_HOSPITAL): Payer: Medicare Other | Admitting: Anesthesiology

## 2021-06-28 DIAGNOSIS — I252 Old myocardial infarction: Secondary | ICD-10-CM | POA: Diagnosis not present

## 2021-06-28 DIAGNOSIS — E785 Hyperlipidemia, unspecified: Secondary | ICD-10-CM | POA: Insufficient documentation

## 2021-06-28 DIAGNOSIS — K2100 Gastro-esophageal reflux disease with esophagitis, without bleeding: Secondary | ICD-10-CM | POA: Insufficient documentation

## 2021-06-28 DIAGNOSIS — I2510 Atherosclerotic heart disease of native coronary artery without angina pectoris: Secondary | ICD-10-CM | POA: Insufficient documentation

## 2021-06-28 DIAGNOSIS — T18128A Food in esophagus causing other injury, initial encounter: Secondary | ICD-10-CM | POA: Diagnosis present

## 2021-06-28 DIAGNOSIS — Z79899 Other long term (current) drug therapy: Secondary | ICD-10-CM | POA: Insufficient documentation

## 2021-06-28 DIAGNOSIS — K209 Esophagitis, unspecified without bleeding: Secondary | ICD-10-CM

## 2021-06-28 DIAGNOSIS — R1310 Dysphagia, unspecified: Secondary | ICD-10-CM | POA: Diagnosis not present

## 2021-06-28 DIAGNOSIS — I10 Essential (primary) hypertension: Secondary | ICD-10-CM | POA: Insufficient documentation

## 2021-06-28 DIAGNOSIS — X58XXXA Exposure to other specified factors, initial encounter: Secondary | ICD-10-CM | POA: Diagnosis not present

## 2021-06-28 DIAGNOSIS — Z87891 Personal history of nicotine dependence: Secondary | ICD-10-CM | POA: Insufficient documentation

## 2021-06-28 DIAGNOSIS — Z79890 Hormone replacement therapy (postmenopausal): Secondary | ICD-10-CM | POA: Insufficient documentation

## 2021-06-28 DIAGNOSIS — T18108A Unspecified foreign body in esophagus causing other injury, initial encounter: Secondary | ICD-10-CM | POA: Insufficient documentation

## 2021-06-28 DIAGNOSIS — I080 Rheumatic disorders of both mitral and aortic valves: Secondary | ICD-10-CM | POA: Insufficient documentation

## 2021-06-28 DIAGNOSIS — Z955 Presence of coronary angioplasty implant and graft: Secondary | ICD-10-CM | POA: Insufficient documentation

## 2021-06-28 DIAGNOSIS — K2900 Acute gastritis without bleeding: Secondary | ICD-10-CM

## 2021-06-28 DIAGNOSIS — E039 Hypothyroidism, unspecified: Secondary | ICD-10-CM | POA: Insufficient documentation

## 2021-06-28 DIAGNOSIS — Z951 Presence of aortocoronary bypass graft: Secondary | ICD-10-CM | POA: Diagnosis not present

## 2021-06-28 DIAGNOSIS — Z20822 Contact with and (suspected) exposure to covid-19: Secondary | ICD-10-CM | POA: Insufficient documentation

## 2021-06-28 DIAGNOSIS — K222 Esophageal obstruction: Secondary | ICD-10-CM

## 2021-06-28 HISTORY — PX: IMPACTION REMOVAL: SHX5858

## 2021-06-28 HISTORY — DX: Food in esophagus causing other injury, initial encounter: T18.128A

## 2021-06-28 HISTORY — PX: ESOPHAGOGASTRODUODENOSCOPY (EGD) WITH PROPOFOL: SHX5813

## 2021-06-28 LAB — CBC WITH DIFFERENTIAL/PLATELET
Abs Immature Granulocytes: 0.18 10*3/uL — ABNORMAL HIGH (ref 0.00–0.07)
Basophils Absolute: 0 10*3/uL (ref 0.0–0.1)
Basophils Relative: 0 %
Eosinophils Absolute: 0 10*3/uL (ref 0.0–0.5)
Eosinophils Relative: 0 %
HCT: 42.9 % (ref 39.0–52.0)
Hemoglobin: 14.1 g/dL (ref 13.0–17.0)
Immature Granulocytes: 2 %
Lymphocytes Relative: 15 %
Lymphs Abs: 1.3 10*3/uL (ref 0.7–4.0)
MCH: 31.8 pg (ref 26.0–34.0)
MCHC: 32.9 g/dL (ref 30.0–36.0)
MCV: 96.8 fL (ref 80.0–100.0)
Monocytes Absolute: 2 10*3/uL — ABNORMAL HIGH (ref 0.1–1.0)
Monocytes Relative: 23 %
Neutro Abs: 5.2 10*3/uL (ref 1.7–7.7)
Neutrophils Relative %: 60 %
Platelets: 124 10*3/uL — ABNORMAL LOW (ref 150–400)
RBC: 4.43 MIL/uL (ref 4.22–5.81)
RDW: 12.6 % (ref 11.5–15.5)
WBC: 8.7 10*3/uL (ref 4.0–10.5)
nRBC: 0 % (ref 0.0–0.2)

## 2021-06-28 LAB — BASIC METABOLIC PANEL
Anion gap: 8 (ref 5–15)
BUN: 21 mg/dL (ref 8–23)
CO2: 25 mmol/L (ref 22–32)
Calcium: 9.2 mg/dL (ref 8.9–10.3)
Chloride: 104 mmol/L (ref 98–111)
Creatinine, Ser: 1.27 mg/dL — ABNORMAL HIGH (ref 0.61–1.24)
GFR, Estimated: 53 mL/min — ABNORMAL LOW (ref >60)
Glucose, Bld: 106 mg/dL — ABNORMAL HIGH (ref 70–99)
Potassium: 3.9 mmol/L (ref 3.5–5.1)
Sodium: 137 mmol/L (ref 135–145)

## 2021-06-28 LAB — RESP PANEL BY RT-PCR (FLU A&B, COVID) ARPGX2
Influenza A by PCR: NEGATIVE
Influenza B by PCR: NEGATIVE
SARS Coronavirus 2 by RT PCR: NEGATIVE

## 2021-06-28 SURGERY — ESOPHAGOGASTRODUODENOSCOPY (EGD) WITH PROPOFOL
Anesthesia: General

## 2021-06-28 MED ORDER — SODIUM CHLORIDE 0.9 % IV SOLN: INTRAVENOUS | Status: DC

## 2021-06-28 MED ORDER — ONDANSETRON HCL 4 MG/2ML IJ SOLN
INTRAMUSCULAR | Status: DC | PRN
  Administered 2021-06-28: 4 mg via INTRAVENOUS

## 2021-06-28 MED ORDER — GLUCAGON HCL RDNA (DIAGNOSTIC) 1 MG IJ SOLR
1.0000 mg | Freq: Once | INTRAMUSCULAR | Status: AC
  Administered 2021-06-28: 1 mg via INTRAVENOUS
  Filled 2021-06-28: qty 1

## 2021-06-28 MED ORDER — SUCCINYLCHOLINE CHLORIDE 200 MG/10ML IV SOSY
PREFILLED_SYRINGE | INTRAVENOUS | Status: DC | PRN
  Administered 2021-06-28: 100 mg via INTRAVENOUS

## 2021-06-28 MED ORDER — LACTATED RINGERS IV SOLN: INTRAVENOUS | Status: DC | PRN

## 2021-06-28 MED ORDER — LIDOCAINE 2% (20 MG/ML) 5 ML SYRINGE
INTRAMUSCULAR | Status: DC | PRN
  Administered 2021-06-28: 100 mg via INTRAVENOUS

## 2021-06-28 MED ORDER — PROPOFOL 10 MG/ML IV BOLUS
INTRAVENOUS | Status: AC
  Filled 2021-06-28: qty 20

## 2021-06-28 MED ORDER — DEXAMETHASONE SODIUM PHOSPHATE 10 MG/ML IJ SOLN
INTRAMUSCULAR | Status: DC | PRN
  Administered 2021-06-28: 4 mg via INTRAVENOUS

## 2021-06-28 MED ORDER — PANTOPRAZOLE SODIUM 40 MG IV SOLR
40.0000 mg | Freq: Once | INTRAVENOUS | Status: AC
  Administered 2021-06-28: 40 mg via INTRAVENOUS
  Filled 2021-06-28: qty 10

## 2021-06-28 MED ORDER — EPHEDRINE SULFATE-NACL 50-0.9 MG/10ML-% IV SOSY
PREFILLED_SYRINGE | INTRAVENOUS | Status: DC | PRN
  Administered 2021-06-28: 10 mg via INTRAVENOUS

## 2021-06-28 MED ORDER — PROPOFOL 10 MG/ML IV BOLUS
INTRAVENOUS | Status: DC | PRN
  Administered 2021-06-28: 100 mg via INTRAVENOUS

## 2021-06-28 SURGICAL SUPPLY — 15 items

## 2021-06-28 NOTE — Anesthesia Postprocedure Evaluation (Signed)
Anesthesia Post Note  Patient: Patrick [name]  Procedure(s) Performed: ESOPHAGOGASTRODUODENOSCOPY (EGD) WITH PROPOFOL IMPACTION REMOVAL     Patient location during evaluation: PACU Anesthesia Type: General Level of consciousness: awake and alert Pain management: pain level controlled Vital Signs Assessment: post-procedure vital signs reviewed and stable Respiratory status: spontaneous breathing, nonlabored ventilation, respiratory function stable and patient connected to nasal cannula oxygen Cardiovascular status: blood pressure returned to baseline and stable Postop Assessment: no apparent nausea or vomiting Anesthetic complications: no   No notable events documented.  Last Vitals:  Vitals:   06/28/21 2015 06/28/21 2029  BP: (!) 156/74 (!) 170/68  Pulse: 67 69  Resp: 19 (!) 24  Temp:  (!) 36.4 C  SpO2: 97% 98%    Last Pain:  Vitals:   06/28/21 2029  TempSrc:   PainSc: 0-No pain                 [name]

## 2021-06-28 NOTE — Anesthesia Preprocedure Evaluation (Signed)
Anesthesia Evaluation  Patient identified by MRN, date of birth, ID band Patient awake    Reviewed: Allergy & Precautions, NPO status , Patient's Chart, lab work & pertinent test results  Airway Mallampati: I  TM Distance: >3 FB Neck ROM: Full    Dental no notable dental hx.    Pulmonary neg pulmonary ROS, former smoker,    Pulmonary exam normal breath sounds clear to auscultation       Cardiovascular hypertension, + CAD, + Past MI, + Cardiac Stents and + CABG  Normal cardiovascular exam Rhythm:Regular Rate:Normal  07/2020  Left ventricular ejection fraction, by estimation, is 60 to 65%. The  left ventricle has normal function. The left ventricle has no regional  wall motion abnormalities. There is mild left ventricular hypertrophy.  Left ventricular diastolic parameters  are consistent with Grade I diastolic dysfunction (impaired relaxation).  2. Right ventricular systolic function is normal. The right ventricular  size is normal.  3. The mitral valve is normal in structure. Mild mitral valve  regurgitation. No evidence of mitral stenosis.  4. The aortic valve is normal in structure. Aortic valve regurgitation is  mild. Aortic valve sclerosis is present, with no evidence of aortic valve  stenosis.  5. The inferior vena cava is normal in size with greater than 50%  respiratory variability, suggesting right atrial pressure of 3 mmHg.    Neuro/Psych negative neurological ROS  negative psych ROS   GI/Hepatic Neg liver ROS, GERD  Medicated,  Endo/Other  Hypothyroidism   Renal/GU negative Renal ROS  negative genitourinary   Musculoskeletal negative musculoskeletal ROS (+)   Abdominal   Peds negative pediatric ROS (+)  Hematology negative hematology ROS (+)   Anesthesia Other Findings   Reproductive/Obstetrics negative OB ROS                             Anesthesia Physical Anesthesia  Plan  ASA: 3  Anesthesia Plan: General   Post-op Pain Management:    Induction: Intravenous and Rapid sequence  PONV Risk Score and Plan: 2 and Ondansetron and Treatment may vary due to age or medical condition  Airway Management Planned: Oral ETT  Additional Equipment:   Intra-op Plan:   Post-operative Plan: Extubation in OR  Informed Consent: I have reviewed the patients History and Physical, chart, labs and discussed the procedure including the risks, benefits and alternatives for the proposed anesthesia with the patient or authorized representative who has indicated his/her understanding and acceptance.     Dental advisory given  Plan Discussed with: CRNA and Surgeon  Anesthesia Plan Comments:         Anesthesia Quick Evaluation

## 2021-06-28 NOTE — ED Triage Notes (Addendum)
Pt states he swallows food and it gets stuck in his throat -started last night-hx of same with "stretched my throat"-NAD-steady gait

## 2021-06-28 NOTE — Op Note (Signed)
[facility] Patient Name: Patrick [name] Procedure Date: 06/28/2021 MRN: [id number] Attending MD: Lear Ng , MD Date of Birth: 06/06/1926 CSN: [id number] Age: 88 Admit Type: Outpatient Procedure:                Upper GI endoscopy Indications:              Foreign body in the esophagus Providers:                Lear Ng, MD, Vista Lawman, RN, Cletis Athens, Technician Referring MD:             ER Medicines:                Propofol per Anesthesia, Monitored Anesthesia Care Complications:            No immediate complications. Estimated Blood Loss:     Estimated blood loss: none. Procedure:                Pre-Anesthesia Assessment:                           - Prior to the procedure, a History and Physical                            was performed, and patient medications and                            allergies were reviewed. The patient's tolerance of                            previous anesthesia was also reviewed. The risks                            and benefits of the procedure and the sedation                            options and risks were discussed with the patient.                            All questions were answered, and informed consent                            was obtained. Prior Anticoagulants: The patient has                            taken no previous anticoagulant or antiplatelet                            agents. ASA Grade Assessment: III - A patient with                            severe systemic disease. After reviewing the risks  and benefits, the patient was deemed in                            satisfactory condition to undergo the procedure.                           After obtaining informed consent, the endoscope was                            passed under direct vision. Throughout the                            procedure, the patient's blood pressure, pulse, and                             oxygen saturations were monitored continuously. The                            [id number]-[id number] ([id number]) Olympus endoscope was introduced                            through the mouth, and advanced to the second part                            of duodenum. The upper GI endoscopy was                            accomplished without difficulty. The patient                            tolerated the procedure well. Scope In: Scope Out: Findings:      Food was found in the distal esophagus and at the gastroesophageal       junction. Removal of food was accomplished.      A mild Schatzki ring was found at the gastroesophageal junction.      LA Grade B (one or more mucosal breaks greater than 5 mm, not extending       between the tops of two mucosal folds) esophagitis with no bleeding was       found at the gastroesophageal junction.      The cardia and gastric fundus were normal on retroflexion.      The examined duodenum was normal.      Segmental mild inflammation characterized by congestion (edema) and       erythema was found in the gastric antrum. Impression:               - Food at the gastroesophageal junction. Removal                            was successful with two passes of a Roth net.                           - Mild Schatzki ring.                           -  LA Grade B esophagitis with no bleeding.                           - Normal examined duodenum.                           - Acute gastritis. Moderate Sedation:      N/A - MAC procedure Recommendation:           - Patient has a contact number available for                            emergencies. The signs and symptoms of potential                            delayed complications were discussed with the                            patient. Return to normal activities tomorrow.                            Written discharge instructions were provided to the                            patient.                           -  Clear liquid diet.                           - Repeat upper endoscopy if dysphagia persisting                            for dilation of the esophagus.                           - Needs to take small bites of food, chew well, and                            eat slow. If he continues to have dysphagia, then                            will need to be on a soft diet and if symptoms                            persist may need a repeat EGD with dilation. Procedure Code(s):        --- Professional ---                           [id number], Esophagogastroduodenoscopy, flexible,                            transoral; with removal of foreign body(s) Diagnosis Code(s):        --- Professional ---  D14.970Y, Food in esophagus causing other injury,                            initial encounter                           K22.2, Esophageal obstruction                           K20.90, Esophagitis, unspecified without bleeding                           K29.00, Acute gastritis without bleeding                           T18.108A, Unspecified foreign body in esophagus                            causing other injury, initial encounter CPT copyright 2019 [employer]. All rights reserved. The codes documented in this report are preliminary and upon coder review may  be revised to meet current compliance requirements. Lear Ng, MD 06/28/2021 7:54:01 PM This report has been signed electronically. Number of Addenda: 0

## 2021-06-28 NOTE — ED Provider Notes (Signed)
[facility] EMERGENCY DEPARTMENT Provider Note   CSN: [id number] Arrival date & time: 06/28/21  1419     History  No chief complaint on file.   Patrick [name] is a 88 y.o. male.  88 yo M with a chief complaint of difficulty swallowing.  This been going on since last night.  He was eating some thinly shaved roast beef and was unable to swallow it he felt like it got stuck and then he made himself vomit.  He tried some eggs this morning and was unable to and then tried Jell-O and did not work as well.  He thinks he probably could swallow liquids but has not really tried it at home.  He tells me this happened previously and he had to have his esophagus stretched.  He does not really know the ins and outs of this.  Tells me that his wife usually takes care of this but she is at [facility] [name] is currently hospitalized.  I was able to call the wife on the phone he said she did call the GI office.  They told him to come to [place] long to be evaluated.       Home Medications Prior to Admission medications   Medication Sig Start Date End Date Taking? Authorizing Provider  allopurinol (ZYLOPRIM) 100 MG tablet Take 100 mg by mouth daily. 10/09/19   [provider]  aspirin 81 MG tablet Take 81 mg by mouth daily.      [provider]  atorvastatin (LIPITOR) 20 MG tablet Take 1 tablet (20 mg total) by mouth daily. 02/25/18 03/16/21  Daune Perch, NP  donepezil (ARICEPT) 10 MG tablet Take 10 mg by mouth at bedtime.  02/13/18   [provider]  DULoxetine (CYMBALTA) 30 MG capsule Take 30 mg by mouth daily. 10/27/20   [provider]  ezetimibe (ZETIA) 10 MG tablet Take 1 tablet (10 mg total) by mouth daily. 11/02/20 03/16/21  Lelon Perla, MD  finasteride (PROSCAR) 5 MG tablet Take 5 mg by mouth daily. 11/03/19   [provider]  fludrocortisone (FLORINEF) 0.1 MG tablet Take 1 tablet (0.1 mg total) by mouth daily. 12/17/19   Burtis Junes, NP   levothyroxine (SYNTHROID) 112 MCG tablet Take 112 mcg by mouth daily. 10/09/19   [provider]  lidocaine (LIDODERM) 5 % Place 1 patch onto the skin daily. 02/11/21   [provider]  LINZESS 72 MCG capsule Take 72 mcg by mouth as needed (constipation). 01/08/20   [provider]  memantine (NAMENDA) 5 MG tablet Take 5 mg by mouth daily. 03/01/21   [provider]  mirtazapine (REMERON) 15 MG tablet SMARTSIG:1 Pill By Mouth Every Night 06/22/19   [provider]  nitroGLYCERIN (NITROSTAT) 0.4 MG SL tablet Place 1 tablet (0.4 mg total) under the tongue every 5 (five) minutes as needed. 07/03/19   Burtis Junes, NP  pantoprazole (PROTONIX) 40 MG tablet Take 1 tablet (40 mg total) by mouth daily before breakfast. 03/25/19 03/16/21  Brahmbhatt, Orson Gear, MD  sertraline (ZOLOFT) 100 MG tablet Take 100 mg by mouth daily. 09/19/19   [provider]  tamsulosin (FLOMAX) 0.4 MG CAPS capsule Take 0.4 mg by mouth daily after supper.  02/20/20   [provider]  Vitamin D, Ergocalciferol, (DRISDOL) 50000 units CAPS capsule Take 50,000 Units by mouth once a week. 07/31/17   [provider]      Allergies    Other, Pravachol, and  Triazolam    Review of Systems   Review of Systems  Physical Exam Updated Vital Signs BP (!) 156/58 (BP Location: Right Arm)    Pulse 63    Temp 97.8 F (36.6 C) (Oral)    Resp 18    Ht 6' (1.829 m)    Wt 67.6 kg    SpO2 100%    BMI 20.21 kg/m  Physical Exam Vitals and nursing note reviewed.  Constitutional:      Appearance: He is well-developed.  HENT:     Head: Normocephalic and atraumatic.     Mouth/Throat:     Comments: Tolerating secretions without difficulty.  No obvious foreign body.  No erythema edema to the posterior pharynx. Eyes:     Pupils: Pupils are equal, round, and reactive to light.  Neck:     Vascular: No JVD.  Cardiovascular:     Rate and Rhythm: Normal rate and regular rhythm.      Heart sounds: No murmur heard.   No friction rub. No gallop.  Pulmonary:     Effort: No respiratory distress.     Breath sounds: No wheezing.  Abdominal:     General: There is no distension.     Tenderness: There is no abdominal tenderness. There is no guarding or rebound.  Musculoskeletal:        General: Normal range of motion.     Cervical back: Normal range of motion and neck supple.  Skin:    Coloration: Skin is not pale.     Findings: No rash.  Neurological:     Mental Status: He is alert and oriented to person, place, and time.  Psychiatric:        Behavior: Behavior normal.    ED Results / Procedures / Treatments   Labs (all labs ordered are listed, but only abnormal results are displayed) Labs Reviewed - No data to display  EKG None  Radiology No results found.  Procedures Procedures    Medications Ordered in ED Medications  glucagon (human recombinant) (GLUCAGEN) injection 1 mg (1 mg Intravenous Given 06/28/21 1551)  pantoprazole (PROTONIX) injection 40 mg (40 mg Intravenous Given 06/28/21 1550)    ED Course/ Medical Decision Making/ A&P                           Medical Decision Making Risk Prescription drug management.   88 yo M with a chief complaints of an inability to swallow.  This been going on since last night.  He denies having anything retained there.  He does not have to spit up his secretions.  He feels like he probably could swallow liquids but has not really tried today.  Oral trial here unfortunately was unsuccessful.  Will discuss with GI.  I reviewed the patient's records last time he had this procedure performed was by Dr. Alessandra Bevels.  He happens to be on-call today.  I discussed case with Dr. Alessandra Bevels, recommended trial of IV Protonix and glucagon and repeat oral trial 30 minutes post.  Patient without significant improvement unable to tolerate liquids will send to ED to ED to [facility].  Notify Dr. Alessandra Bevels.  The patients  results and plan were reviewed and discussed.   Any x-rays performed were independently reviewed by myself.   Differential diagnosis were considered with the presenting HPI.  Medications  glucagon (human recombinant) (GLUCAGEN) injection 1 mg (1 mg Intravenous Given 06/28/21 1551)  pantoprazole (PROTONIX) injection 40 mg (  40 mg Intravenous Given 06/28/21 1550)    Vitals:   06/28/21 1430 06/28/21 1431 06/28/21 1530 06/28/21 1600  BP: (!) 183/74  (!) 146/89 (!) 156/58  Pulse: 71  65 63  Resp: 18   18  Temp: 97.8 F (36.6 C)     TempSrc: Oral     SpO2: 100%  99% 100%  Weight:  67.6 kg    Height:  6' (1.829 m)      Final diagnoses:  Esophageal stricture           Final Clinical Impression(s) / ED Diagnoses Final diagnoses:  Esophageal stricture    Rx / DC Orders ED Discharge Orders     None         Deno Etienne, DO 06/28/21 1642

## 2021-06-28 NOTE — Anesthesia Procedure Notes (Signed)
Procedure Name: Intubation Date/Time: 06/28/2021 7:26 PM Performed by: Rosaland Lao, CRNA Pre-anesthesia Checklist: Patient identified, Emergency Drugs available, Suction available and Patient being monitored Patient Re-evaluated:Patient Re-evaluated prior to induction Oxygen Delivery Method: Circle system utilized Preoxygenation: Pre-oxygenation with 100% oxygen Induction Type: IV induction and Rapid sequence Laryngoscope Size: Mac and 4 Grade View: Grade II Tube type: Oral Tube size: 8.0 mm Number of attempts: 2 Airway Equipment and Method: Stylet and Oral airway Placement Confirmation: ETT inserted through vocal cords under direct vision, positive ETCO2 and breath sounds checked- equal and bilateral Secured at: 22 cm Tube secured with: Tape Dental Injury: Teeth and Oropharynx as per pre-operative assessment

## 2021-06-28 NOTE — ED Notes (Signed)
Patient transported to EEG procedure.

## 2021-06-28 NOTE — H&P (View-Only) (Signed)
Referring Provider: Dr. Darl Householder Primary Care Physician:  Ginger Organ., MD Primary Gastroenterologist:  Dr. Cristina Gong  Reason for Consultation:  Food Impaction  HPI: Patrick [name] is a 88 y.o. male   Ate shaved beef last night and felt it hang up and since then has been unable to swallow saliva or liquids. History of an esophageal ring with food impactions in the past and last had an EGD with dilation of a Schatzki's ring in 03/2019 to 13.5 mm with a TTS balloon. He was empirically dilated to 16 mm with a Savary dilator in April 2020. He is demented and cannot give me any history. His wife who is currently hospitalized is on the phone and says he eats what he wants. Patient's brother is with him. Patient does not know if he cuts his food up but says he chews for a long time. Past Medical History:  Diagnosis Date   Bilateral foot-drop 12/17/2019   BPH (benign prostatic hypertrophy)    Diverticulosis    ED (erectile dysfunction)    Gait abnormality 12/17/2019   GERD (gastroesophageal reflux disease)    Gout    Hepatitis    hx hepatitis after mono as teenager   History of kidney stones    History of skin cancer    Hyperlipidemia    Hypertension    Hypothyroidism    IHD (ischemic heart disease)    Remote CABG in 1997   Memory disorder 12/17/2019   MI, acute, non ST segment elevation ([place]) 2010   s/p cath with occluded SVG to PD that fills by collaterals and the remainder of his revasculsarization is satisfactory. "the first time they did the stent , the second time they did the graft 8 vessels"   Nocturia    Thrombocytopenia ([place]) 11/19/2018   Tremor, essential 12/17/2019   Urinary frequency     Past Surgical History:  Procedure Laterality Date   APPENDECTOMY     BALLOON DILATION N/A 03/25/2019   Procedure: BALLOON DILATION;  Surgeon: Otis Brace, MD;  Location: [facility];  Service: Gastroenterology;  Laterality: N/A;   CARDIAC CATHETERIZATION  09/08/2008   NORMAL. EF 60%;  Occluded SVG to PD that fills by collaterals and the remainder of his revascularization is satisfactory. he is managed medically.    CORONARY ARTERY BYPASS GRAFT  1997   CABG x 8   CORONARY STENT INTERVENTION N/A 11/18/2018   Procedure: CORONARY STENT INTERVENTION;  Surgeon: Sherren Mocha, MD;  Location: [facility];  Service: Cardiovascular;  Laterality: N/A;   CYSTOSCOPY WITH INSERTION OF UROLIFT N/A 03/19/2015   Procedure: CYSTOSCOPY WITH INSERTION OF FOUR UROLIFTS;  Surgeon: Carolan Clines, MD;  Location: [facility];  Service: Urology;  Laterality: N/A;   ESOPHAGOGASTRODUODENOSCOPY (EGD) WITH PROPOFOL N/A 02/12/2017   Procedure: ESOPHAGOGASTRODUODENOSCOPY (EGD) WITH PROPOFOL;  Surgeon: Otis Brace, MD;  Location: [facility];  Service: Gastroenterology;  Laterality: N/A;   ESOPHAGOGASTRODUODENOSCOPY (EGD) WITH PROPOFOL N/A 07/13/2018   Procedure: ESOPHAGOGASTRODUODENOSCOPY (EGD) WITH PROPOFOL;  Surgeon: Clarene Essex, MD;  Location: [facility];  Service: Endoscopy;  Laterality: N/A;   ESOPHAGOGASTRODUODENOSCOPY (EGD) WITH PROPOFOL N/A 03/25/2019   Procedure: ESOPHAGOGASTRODUODENOSCOPY (EGD) WITH PROPOFOL;  Surgeon: Otis Brace, MD;  Location: [facility];  Service: Gastroenterology;  Laterality: N/A;   FOREIGN BODY REMOVAL N/A 07/13/2018   Procedure: FOREIGN BODY REMOVAL;  Surgeon: Clarene Essex, MD;  Location: [facility];  Service: Endoscopy;  Laterality: N/A;   LEFT HEART CATH AND CORS/GRAFTS ANGIOGRAPHY N/A 11/18/2018   Procedure:  LEFT HEART CATH AND CORS/GRAFTS ANGIOGRAPHY;  Surgeon: Sherren Mocha, MD;  Location: [facility];  Service: Cardiovascular;  Laterality: N/A;    Prior to Admission medications   Medication Sig Start Date End Date Taking? Authorizing Provider  allopurinol (ZYLOPRIM) 100 MG tablet Take 100 mg by mouth daily. 10/09/19   [provider]  aspirin 81 MG tablet Take 81 mg by mouth daily.      [provider]  atorvastatin  (LIPITOR) 20 MG tablet Take 1 tablet (20 mg total) by mouth daily. 02/25/18 03/16/21  Daune Perch, NP  donepezil (ARICEPT) 10 MG tablet Take 10 mg by mouth at bedtime.  02/13/18   [provider]  DULoxetine (CYMBALTA) 30 MG capsule Take 30 mg by mouth daily. 10/27/20   [provider]  ezetimibe (ZETIA) 10 MG tablet Take 1 tablet (10 mg total) by mouth daily. 11/02/20 03/16/21  Lelon Perla, MD  finasteride (PROSCAR) 5 MG tablet Take 5 mg by mouth daily. 11/03/19   [provider]  fludrocortisone (FLORINEF) 0.1 MG tablet Take 1 tablet (0.1 mg total) by mouth daily. 12/17/19   Burtis Junes, NP  levothyroxine (SYNTHROID) 112 MCG tablet Take 112 mcg by mouth daily. 10/09/19   [provider]  lidocaine (LIDODERM) 5 % Place 1 patch onto the skin daily. 02/11/21   [provider]  LINZESS 72 MCG capsule Take 72 mcg by mouth as needed (constipation). 01/08/20   [provider]  memantine (NAMENDA) 5 MG tablet Take 5 mg by mouth daily. 03/01/21   [provider]  mirtazapine (REMERON) 15 MG tablet SMARTSIG:1 Pill By Mouth Every Night 06/22/19   [provider]  nitroGLYCERIN (NITROSTAT) 0.4 MG SL tablet Place 1 tablet (0.4 mg total) under the tongue every 5 (five) minutes as needed. 07/03/19   Burtis Junes, NP  pantoprazole (PROTONIX) 40 MG tablet Take 1 tablet (40 mg total) by mouth daily before breakfast. 03/25/19 03/16/21  Brahmbhatt, Orson Gear, MD  sertraline (ZOLOFT) 100 MG tablet Take 100 mg by mouth daily. 09/19/19   [provider]  tamsulosin (FLOMAX) 0.4 MG CAPS capsule Take 0.4 mg by mouth daily after supper.  02/20/20   [provider]  Vitamin D, Ergocalciferol, (DRISDOL) 50000 units CAPS capsule Take 50,000 Units by mouth once a week. 07/31/17   [provider]    Scheduled Meds: Continuous Infusions:  sodium chloride     PRN Meds:.  Allergies as of 06/28/2021 - Review Complete 06/28/2021   Allergen Reaction Noted   Other Other (See Comments) 08/20/2020   Pravachol  10/25/2010   Triazolam Other (See Comments) 03/20/2011    Family History  Problem Relation Age of Onset   Heart failure Mother 45    Social History   Socioeconomic History   Marital status: Married    Spouse name: [name]    Number of children: Not on file   Years of education: Not on file   Highest education level: 12th grade  Occupational History   Occupation: Retired    Fish farm manager: RETIRED    Comment: was in [employer]  Tobacco Use   Smoking status: Former    Packs/day: 1.00    Years: 15.00    Pack years: 15.00    Types: Cigarettes    Quit date: 05/15/1958    Years since quitting: 63.1   Smokeless tobacco: Never  Vaping Use   Vaping Use: Never used  Substance and Sexual Activity   Alcohol use:  Yes    Comment: occasional beer   Drug use: No   Sexual activity: Yes  Other Topics Concern   Not on file  Social History Narrative   Mr Adami is a 88 year old retired male who lives at home with his wife, [name].     Social Determinants of Health   Financial Resource Strain: Not on file  Food Insecurity: Not on file  Transportation Needs: Not on file  Physical Activity: Not on file  Stress: Not on file  Social Connections: Not on file  Intimate Partner Violence: Not on file    Review of Systems: All negative except as stated above in HPI.  Physical Exam: Vital signs: Vitals:   06/28/21 1739 06/28/21 1900  BP: (!) 169/75 (!) 208/87  Pulse: 64 66  Resp: 18 (!) 24  Temp:  97.8 F (36.6 C)  SpO2: 100% 100%     General:   Demented, elderly, thin, pleasant, no acute distress  Head: normocephalic, atraumatic Eyes: anicteric sclera ENT: oropharynx clear Neck: supple, nontender Lungs:  Clear throughout to auscultation.   No wheezes, crackles, or rhonchi. No acute distress. Heart:  Regular rate and rhythm; no murmurs, clicks, rubs,  or gallops. Abdomen: soft, nontender, nondistended, +BS   Rectal:  Deferred Ext: no edema  GI:  Lab Results: Recent Labs    06/28/21 1750  WBC 8.7  HGB 14.1  HCT 42.9  PLT 124*   BMET Recent Labs    06/28/21 1750  NA 137  K 3.9  CL 104  CO2 25  GLUCOSE 106*  BUN 21  CREATININE 1.27*  CALCIUM 9.2   LFT No results for input(s): PROT, ALBUMIN, AST, ALT, ALKPHOS, BILITOT, BILIDIR, IBILI in the last 72 hours. PT/INR No results for input(s): LABPROT, INR in the last 72 hours.   Studies/Results: No results found.  Impression/Plan: Food impaction with history of a distal esophageal ring and dilations in 2020. EGD to evaluate for the food impaction and will do a f/u EGD if dilation needed. Informed consent obtained from his wife by phone.     LOS: 0 days   Lear Ng  06/28/2021, 7:22 PM  Questions please call [phone]

## 2021-06-28 NOTE — Transfer of Care (Signed)
Immediate Anesthesia Transfer of Care Note  Patient: [place] H Forgione  Procedure(s) Performed: ESOPHAGOGASTRODUODENOSCOPY (EGD) WITH PROPOFOL IMPACTION REMOVAL  Patient Location: PACU  Anesthesia Type:General  Level of Consciousness: awake, alert  and oriented  Airway & Oxygen Therapy: Patient Spontanous Breathing and Patient connected to face mask  Post-op Assessment: Report given to RN and Post -op Vital signs reviewed and stable  Post vital signs: Reviewed and stable  Last Vitals:  Vitals Value Taken Time  BP    Temp    Pulse 71 06/28/21 1958  Resp 16 06/28/21 1958  SpO2 95 % 06/28/21 1958  Vitals shown include unvalidated device data.  Last Pain:  Vitals:   06/28/21 1900  TempSrc: Oral  PainSc: 0-No pain         Complications: No notable events documented.

## 2021-06-28 NOTE — Interval H&P Note (Signed)
History and Physical Interval Note:  06/28/2021 7:25 PM  Patrick [name]  has presented today for surgery, with the diagnosis of food impaction.  The various methods of treatment have been discussed with the patient and family. After consideration of risks, benefits and other options for treatment, the patient has consented to  Procedure(s): ESOPHAGOGASTRODUODENOSCOPY (EGD) WITH PROPOFOL (N/A) as a surgical intervention.  The patient's history has been reviewed, patient examined, no change in status, stable for surgery.  I have reviewed the patient's chart and labs.  Questions were answered to the patient's satisfaction.     Lear Ng

## 2021-06-28 NOTE — Consult Note (Signed)
Referring Provider: Dr. Darl Householder Primary Care Physician:  Ginger Organ., MD Primary Gastroenterologist:  Dr. Cristina Gong  Reason for Consultation:  Food Impaction  HPI: Patrick [name] is a 88 y.o. male   Ate shaved beef last night and felt it hang up and since then has been unable to swallow saliva or liquids. History of an esophageal ring with food impactions in the past and last had an EGD with dilation of a Schatzki's ring in 03/2019 to 13.5 mm with a TTS balloon. He was empirically dilated to 16 mm with a Savary dilator in April 2020. He is demented and cannot give me any history. His wife who is currently hospitalized is on the phone and says he eats what he wants. Patient's brother is with him. Patient does not know if he cuts his food up but says he chews for a long time. Past Medical History:  Diagnosis Date   Bilateral foot-drop 12/17/2019   BPH (benign prostatic hypertrophy)    Diverticulosis    ED (erectile dysfunction)    Gait abnormality 12/17/2019   GERD (gastroesophageal reflux disease)    Gout    Hepatitis    hx hepatitis after mono as teenager   History of kidney stones    History of skin cancer    Hyperlipidemia    Hypertension    Hypothyroidism    IHD (ischemic heart disease)    Remote CABG in 1997   Memory disorder 12/17/2019   MI, acute, non ST segment elevation ([place]) 2010   s/p cath with occluded SVG to PD that fills by collaterals and the remainder of his revasculsarization is satisfactory. "the first time they did the stent , the second time they did the graft 8 vessels"   Nocturia    Thrombocytopenia ([place]) 11/19/2018   Tremor, essential 12/17/2019   Urinary frequency     Past Surgical History:  Procedure Laterality Date   APPENDECTOMY     BALLOON DILATION N/A 03/25/2019   Procedure: BALLOON DILATION;  Surgeon: Otis Brace, MD;  Location: [facility];  Service: Gastroenterology;  Laterality: N/A;   CARDIAC CATHETERIZATION  09/08/2008   NORMAL. EF 60%;  Occluded SVG to PD that fills by collaterals and the remainder of his revascularization is satisfactory. he is managed medically.    CORONARY ARTERY BYPASS GRAFT  1997   CABG x 8   CORONARY STENT INTERVENTION N/A 11/18/2018   Procedure: CORONARY STENT INTERVENTION;  Surgeon: Sherren Mocha, MD;  Location: [facility];  Service: Cardiovascular;  Laterality: N/A;   CYSTOSCOPY WITH INSERTION OF UROLIFT N/A 03/19/2015   Procedure: CYSTOSCOPY WITH INSERTION OF FOUR UROLIFTS;  Surgeon: Carolan Clines, MD;  Location: [facility];  Service: Urology;  Laterality: N/A;   ESOPHAGOGASTRODUODENOSCOPY (EGD) WITH PROPOFOL N/A 02/12/2017   Procedure: ESOPHAGOGASTRODUODENOSCOPY (EGD) WITH PROPOFOL;  Surgeon: Otis Brace, MD;  Location: [facility];  Service: Gastroenterology;  Laterality: N/A;   ESOPHAGOGASTRODUODENOSCOPY (EGD) WITH PROPOFOL N/A 07/13/2018   Procedure: ESOPHAGOGASTRODUODENOSCOPY (EGD) WITH PROPOFOL;  Surgeon: Clarene Essex, MD;  Location: [facility];  Service: Endoscopy;  Laterality: N/A;   ESOPHAGOGASTRODUODENOSCOPY (EGD) WITH PROPOFOL N/A 03/25/2019   Procedure: ESOPHAGOGASTRODUODENOSCOPY (EGD) WITH PROPOFOL;  Surgeon: Otis Brace, MD;  Location: [facility];  Service: Gastroenterology;  Laterality: N/A;   FOREIGN BODY REMOVAL N/A 07/13/2018   Procedure: FOREIGN BODY REMOVAL;  Surgeon: Clarene Essex, MD;  Location: [facility];  Service: Endoscopy;  Laterality: N/A;   LEFT HEART CATH AND CORS/GRAFTS ANGIOGRAPHY N/A 11/18/2018   Procedure:  LEFT HEART CATH AND CORS/GRAFTS ANGIOGRAPHY;  Surgeon: Sherren Mocha, MD;  Location: [facility];  Service: Cardiovascular;  Laterality: N/A;    Prior to Admission medications   Medication Sig Start Date End Date Taking? Authorizing Provider  allopurinol (ZYLOPRIM) 100 MG tablet Take 100 mg by mouth daily. 10/09/19   [provider]  aspirin 81 MG tablet Take 81 mg by mouth daily.      [provider]  atorvastatin  (LIPITOR) 20 MG tablet Take 1 tablet (20 mg total) by mouth daily. 02/25/18 03/16/21  Daune Perch, NP  donepezil (ARICEPT) 10 MG tablet Take 10 mg by mouth at bedtime.  02/13/18   [provider]  DULoxetine (CYMBALTA) 30 MG capsule Take 30 mg by mouth daily. 10/27/20   [provider]  ezetimibe (ZETIA) 10 MG tablet Take 1 tablet (10 mg total) by mouth daily. 11/02/20 03/16/21  Lelon Perla, MD  finasteride (PROSCAR) 5 MG tablet Take 5 mg by mouth daily. 11/03/19   [provider]  fludrocortisone (FLORINEF) 0.1 MG tablet Take 1 tablet (0.1 mg total) by mouth daily. 12/17/19   Burtis Junes, NP  levothyroxine (SYNTHROID) 112 MCG tablet Take 112 mcg by mouth daily. 10/09/19   [provider]  lidocaine (LIDODERM) 5 % Place 1 patch onto the skin daily. 02/11/21   [provider]  LINZESS 72 MCG capsule Take 72 mcg by mouth as needed (constipation). 01/08/20   [provider]  memantine (NAMENDA) 5 MG tablet Take 5 mg by mouth daily. 03/01/21   [provider]  mirtazapine (REMERON) 15 MG tablet SMARTSIG:1 Pill By Mouth Every Night 06/22/19   [provider]  nitroGLYCERIN (NITROSTAT) 0.4 MG SL tablet Place 1 tablet (0.4 mg total) under the tongue every 5 (five) minutes as needed. 07/03/19   Burtis Junes, NP  pantoprazole (PROTONIX) 40 MG tablet Take 1 tablet (40 mg total) by mouth daily before breakfast. 03/25/19 03/16/21  Brahmbhatt, Orson Gear, MD  sertraline (ZOLOFT) 100 MG tablet Take 100 mg by mouth daily. 09/19/19   [provider]  tamsulosin (FLOMAX) 0.4 MG CAPS capsule Take 0.4 mg by mouth daily after supper.  02/20/20   [provider]  Vitamin D, Ergocalciferol, (DRISDOL) 50000 units CAPS capsule Take 50,000 Units by mouth once a week. 07/31/17   [provider]    Scheduled Meds: Continuous Infusions:  sodium chloride     PRN Meds:.  Allergies as of 06/28/2021 - Review Complete 06/28/2021   Allergen Reaction Noted   Other Other (See Comments) 08/20/2020   Pravachol  10/25/2010   Triazolam Other (See Comments) 03/20/2011    Family History  Problem Relation Age of Onset   Heart failure Mother 57    Social History   Socioeconomic History   Marital status: Married    Spouse name: [name]    Number of children: Not on file   Years of education: Not on file   Highest education level: 12th grade  Occupational History   Occupation: Retired    Fish farm manager: RETIRED    Comment: was in [employer]  Tobacco Use   Smoking status: Former    Packs/day: 1.00    Years: 15.00    Pack years: 15.00    Types: Cigarettes    Quit date: 05/15/1958    Years since quitting: 63.1   Smokeless tobacco: Never  Vaping Use   Vaping Use: Never used  Substance and Sexual Activity   Alcohol use:  Yes    Comment: occasional beer   Drug use: No   Sexual activity: Yes  Other Topics Concern   Not on file  Social History Narrative   Mr Draheim is a 88 year old retired male who lives at home with his wife, [name].     Social Determinants of Health   Financial Resource Strain: Not on file  Food Insecurity: Not on file  Transportation Needs: Not on file  Physical Activity: Not on file  Stress: Not on file  Social Connections: Not on file  Intimate Partner Violence: Not on file    Review of Systems: All negative except as stated above in HPI.  Physical Exam: Vital signs: Vitals:   06/28/21 1739 06/28/21 1900  BP: (!) 169/75 (!) 208/87  Pulse: 64 66  Resp: 18 (!) 24  Temp:  97.8 F (36.6 C)  SpO2: 100% 100%     General:   Demented, elderly, thin, pleasant, no acute distress  Head: normocephalic, atraumatic Eyes: anicteric sclera ENT: oropharynx clear Neck: supple, nontender Lungs:  Clear throughout to auscultation.   No wheezes, crackles, or rhonchi. No acute distress. Heart:  Regular rate and rhythm; no murmurs, clicks, rubs,  or gallops. Abdomen: soft, nontender, nondistended, +BS   Rectal:  Deferred Ext: no edema  GI:  Lab Results: Recent Labs    06/28/21 1750  WBC 8.7  HGB 14.1  HCT 42.9  PLT 124*   BMET Recent Labs    06/28/21 1750  NA 137  K 3.9  CL 104  CO2 25  GLUCOSE 106*  BUN 21  CREATININE 1.27*  CALCIUM 9.2   LFT No results for input(s): PROT, ALBUMIN, AST, ALT, ALKPHOS, BILITOT, BILIDIR, IBILI in the last 72 hours. PT/INR No results for input(s): LABPROT, INR in the last 72 hours.   Studies/Results: No results found.  Impression/Plan: Food impaction with history of a distal esophageal ring and dilations in 2020. EGD to evaluate for the food impaction and will do a f/u EGD if dilation needed. Informed consent obtained from his wife by phone.     LOS: 0 days   Lear Ng  06/28/2021, 7:22 PM  Questions please call [phone]

## 2021-06-29 ENCOUNTER — Encounter (HOSPITAL_COMMUNITY): Payer: Self-pay | Admitting: Gastroenterology

## 2021-07-07 DIAGNOSIS — G629 Polyneuropathy, unspecified: Secondary | ICD-10-CM | POA: Diagnosis not present

## 2021-07-07 DIAGNOSIS — N1831 Chronic kidney disease, stage 3a: Secondary | ICD-10-CM | POA: Diagnosis not present

## 2021-07-07 DIAGNOSIS — I700 Atherosclerosis of aorta: Secondary | ICD-10-CM | POA: Diagnosis not present

## 2021-07-07 DIAGNOSIS — I10 Essential (primary) hypertension: Secondary | ICD-10-CM | POA: Diagnosis not present

## 2021-07-07 DIAGNOSIS — F3341 Major depressive disorder, recurrent, in partial remission: Secondary | ICD-10-CM | POA: Diagnosis not present

## 2021-07-07 DIAGNOSIS — E538 Deficiency of other specified B group vitamins: Secondary | ICD-10-CM | POA: Diagnosis not present

## 2021-07-07 DIAGNOSIS — Z66 Do not resuscitate: Secondary | ICD-10-CM | POA: Diagnosis not present

## 2021-07-07 DIAGNOSIS — Z9861 Coronary angioplasty status: Secondary | ICD-10-CM | POA: Diagnosis not present

## 2021-07-07 DIAGNOSIS — I2510 Atherosclerotic heart disease of native coronary artery without angina pectoris: Secondary | ICD-10-CM | POA: Diagnosis not present

## 2021-07-07 DIAGNOSIS — R1319 Other dysphagia: Secondary | ICD-10-CM | POA: Diagnosis not present

## 2021-07-07 DIAGNOSIS — F0280 Dementia in other diseases classified elsewhere without behavioral disturbance: Secondary | ICD-10-CM | POA: Diagnosis not present

## 2021-07-07 DIAGNOSIS — R7301 Impaired fasting glucose: Secondary | ICD-10-CM | POA: Diagnosis not present

## 2021-08-16 DIAGNOSIS — E538 Deficiency of other specified B group vitamins: Secondary | ICD-10-CM | POA: Diagnosis not present

## 2021-08-29 DIAGNOSIS — Z20822 Contact with and (suspected) exposure to covid-19: Secondary | ICD-10-CM | POA: Diagnosis not present

## 2021-09-07 DIAGNOSIS — M4316 Spondylolisthesis, lumbar region: Secondary | ICD-10-CM | POA: Diagnosis not present

## 2021-09-07 DIAGNOSIS — M48062 Spinal stenosis, lumbar region with neurogenic claudication: Secondary | ICD-10-CM | POA: Diagnosis not present

## 2021-09-07 DIAGNOSIS — M47816 Spondylosis without myelopathy or radiculopathy, lumbar region: Secondary | ICD-10-CM | POA: Diagnosis not present

## 2021-09-07 DIAGNOSIS — M5106 Intervertebral disc disorders with myelopathy, lumbar region: Secondary | ICD-10-CM | POA: Diagnosis not present

## 2021-09-15 DIAGNOSIS — E538 Deficiency of other specified B group vitamins: Secondary | ICD-10-CM | POA: Diagnosis not present

## 2021-09-19 DIAGNOSIS — Z20822 Contact with and (suspected) exposure to covid-19: Secondary | ICD-10-CM | POA: Diagnosis not present

## 2021-09-25 ENCOUNTER — Emergency Department (HOSPITAL_BASED_OUTPATIENT_CLINIC_OR_DEPARTMENT_OTHER): Payer: Medicare Other

## 2021-09-25 ENCOUNTER — Emergency Department (HOSPITAL_BASED_OUTPATIENT_CLINIC_OR_DEPARTMENT_OTHER)
Admission: EM | Admit: 2021-09-25 | Discharge: 2021-09-25 | Disposition: A | Discharge status: Home / Self Care | Payer: Medicare Other | Attending: Emergency Medicine | Admitting: Emergency Medicine

## 2021-09-25 ENCOUNTER — Encounter (HOSPITAL_BASED_OUTPATIENT_CLINIC_OR_DEPARTMENT_OTHER): Payer: Self-pay | Admitting: Emergency Medicine

## 2021-09-25 ENCOUNTER — Other Ambulatory Visit: Payer: Self-pay

## 2021-09-25 DIAGNOSIS — Z79899 Other long term (current) drug therapy: Secondary | ICD-10-CM | POA: Insufficient documentation

## 2021-09-25 DIAGNOSIS — R4182 Altered mental status, unspecified: Secondary | ICD-10-CM | POA: Diagnosis not present

## 2021-09-25 DIAGNOSIS — D72829 Elevated white blood cell count, unspecified: Secondary | ICD-10-CM | POA: Insufficient documentation

## 2021-09-25 DIAGNOSIS — I10 Essential (primary) hypertension: Secondary | ICD-10-CM | POA: Diagnosis not present

## 2021-09-25 DIAGNOSIS — F0390 Unspecified dementia without behavioral disturbance: Secondary | ICD-10-CM | POA: Diagnosis not present

## 2021-09-25 DIAGNOSIS — R55 Syncope and collapse: Secondary | ICD-10-CM | POA: Diagnosis not present

## 2021-09-25 DIAGNOSIS — R569 Unspecified convulsions: Secondary | ICD-10-CM | POA: Diagnosis not present

## 2021-09-25 DIAGNOSIS — Z7982 Long term (current) use of aspirin: Secondary | ICD-10-CM | POA: Diagnosis not present

## 2021-09-25 DIAGNOSIS — I2510 Atherosclerotic heart disease of native coronary artery without angina pectoris: Secondary | ICD-10-CM | POA: Diagnosis not present

## 2021-09-25 LAB — CBC WITH DIFFERENTIAL/PLATELET
Abs Immature Granulocytes: 0.25 10*3/uL — ABNORMAL HIGH (ref 0.00–0.07)
Basophils Absolute: 0 10*3/uL (ref 0.0–0.1)
Basophils Relative: 0 %
Eosinophils Absolute: 0 10*3/uL (ref 0.0–0.5)
Eosinophils Relative: 0 %
HCT: 44.8 % (ref 39.0–52.0)
Hemoglobin: 14.5 g/dL (ref 13.0–17.0)
Immature Granulocytes: 2 %
Lymphocytes Relative: 12 %
Lymphs Abs: 1.6 10*3/uL (ref 0.7–4.0)
MCH: 31.7 pg (ref 26.0–34.0)
MCHC: 32.4 g/dL (ref 30.0–36.0)
MCV: 97.8 fL (ref 80.0–100.0)
Monocytes Absolute: 3.4 10*3/uL — ABNORMAL HIGH (ref 0.1–1.0)
Monocytes Relative: 25 %
Neutro Abs: 8.3 10*3/uL — ABNORMAL HIGH (ref 1.7–7.7)
Neutrophils Relative %: 61 %
Platelets: 91 10*3/uL — ABNORMAL LOW (ref 150–400)
RBC: 4.58 MIL/uL (ref 4.22–5.81)
RDW: 13.1 % (ref 11.5–15.5)
Smear Review: NORMAL
WBC: 13.6 10*3/uL — ABNORMAL HIGH (ref 4.0–10.5)
nRBC: 0 % (ref 0.0–0.2)

## 2021-09-25 LAB — URINALYSIS, ROUTINE W REFLEX MICROSCOPIC
Bilirubin Urine: NEGATIVE
Glucose, UA: NEGATIVE mg/dL
Ketones, ur: NEGATIVE mg/dL
Leukocytes,Ua: NEGATIVE
Nitrite: NEGATIVE
Protein, ur: NEGATIVE mg/dL
Specific Gravity, Urine: 1.02 (ref 1.005–1.030)
pH: 6 (ref 5.0–8.0)

## 2021-09-25 LAB — BASIC METABOLIC PANEL
Anion gap: 7 (ref 5–15)
BUN: 28 mg/dL — ABNORMAL HIGH (ref 8–23)
CO2: 25 mmol/L (ref 22–32)
Calcium: 8.7 mg/dL — ABNORMAL LOW (ref 8.9–10.3)
Chloride: 105 mmol/L (ref 98–111)
Creatinine, Ser: 1.58 mg/dL — ABNORMAL HIGH (ref 0.61–1.24)
GFR, Estimated: 41 mL/min — ABNORMAL LOW (ref >60)
Glucose, Bld: 131 mg/dL — ABNORMAL HIGH (ref 70–99)
Potassium: 4 mmol/L (ref 3.5–5.1)
Sodium: 137 mmol/L (ref 135–145)

## 2021-09-25 LAB — URINALYSIS, MICROSCOPIC (REFLEX): Squamous Epithelial / HPF: NONE SEEN (ref 0–5)

## 2021-09-25 LAB — MAGNESIUM: Magnesium: 2 mg/dL (ref 1.7–2.4)

## 2021-09-25 NOTE — ED Notes (Signed)
Pt care taken, MD in to talk to patient about next steps. ?

## 2021-09-25 NOTE — ED Provider Notes (Signed)
?[facility] ?Provider Note ? ? ?CSN: [id number] ?Arrival date & time: 09/25/21  1644 ? ?  ? ?History ? ?Chief Complaint  ?Patient presents with  ? Loss of Consciousness  ? ? ?Patrick [name] is a 88 y.o. male.  Presents to ER due to concern for episode of syncope versus seizure.  Wife states that she found him unresponsive on the ground, having shaking in his upper and lower extremities, lasted for couple minutes and then resolved, seem to be a little bit confused after the episode.  Now patient is at his baseline mental status.  Patient states he does not recall the event.  Wife states that he does have history of dementia and memory loss.  States his current mental status is baseline.  She denies known history of epilepsy.  Patient denies any ongoing complaints, specifically no associated chest pain, difficulty breathing, no numbness tingling or weakness.  No speech or vision change. ? ? ?Reviewed last cardiology note from Dr. Stanford Breed on November 2022, has extensive history of coronary artery disease, last echo demonstrated mild diastolic dysfunction, mild AS ? ?Reviewed last neurology note from Dr. Jannifer Franklin in 2021, multiple different concerns including memory loss ? ? ?HPI ? ?  ? ?Home Medications ?Prior to Admission medications   ?Medication Sig Start Date End Date Taking? Authorizing Provider  ?divalproex (DEPAKOTE) 250 MG DR tablet Take 250 mg by mouth 2 (two) times daily. 08/15/21  Yes [provider]  ?donepezil (ARICEPT) 10 MG tablet Take 10 mg by mouth at bedtime.  02/13/18  Yes [provider]  ?finasteride (PROSCAR) 5 MG tablet Take 5 mg by mouth daily. 11/03/19  Yes [provider]  ?levothyroxine (SYNTHROID) 112 MCG tablet Take 112 mcg by mouth daily. 10/09/19  Yes [provider]  ?Vitamin D, Ergocalciferol, (DRISDOL) 50000 units CAPS capsule Take 50,000 Units by mouth once a week. 07/31/17  Yes [provider]  ?allopurinol  (ZYLOPRIM) 100 MG tablet Take 100 mg by mouth daily. 10/09/19   [provider]  ?aspirin 81 MG tablet Take 81 mg by mouth daily.      [provider]  ?atorvastatin (LIPITOR) 20 MG tablet Take 1 tablet (20 mg total) by mouth daily. 02/25/18 03/16/21  Daune Perch, NP  ?DULoxetine (CYMBALTA) 30 MG capsule Take 30 mg by mouth daily. 10/27/20   [provider]  ?ezetimibe (ZETIA) 10 MG tablet Take 1 tablet (10 mg total) by mouth daily. 11/02/20 03/16/21  Lelon Perla, MD  ?fludrocortisone (FLORINEF) 0.1 MG tablet Take 1 tablet (0.1 mg total) by mouth daily. 12/17/19   Burtis Junes, NP  ?lidocaine (LIDODERM) 5 % Place 1 patch onto the skin daily. 02/11/21   [provider]  ?Rolan Lipa 72 MCG capsule Take 72 mcg by mouth as needed (constipation). 01/08/20   [provider]  ?memantine (NAMENDA) 5 MG tablet Take 5 mg by mouth daily. 03/01/21   [provider]  ?mirtazapine (REMERON) 15 MG tablet SMARTSIG:1 Pill By Mouth Every Night 06/22/19   [provider]  ?nitroGLYCERIN (NITROSTAT) 0.4 MG SL tablet Place 1 tablet (0.4 mg total) under the tongue every 5 (five) minutes as needed. 07/03/19   Burtis Junes, NP  ?pantoprazole (PROTONIX) 40 MG tablet Take 1 tablet (40 mg total) by mouth daily before breakfast. 03/25/19 03/16/21  Otis Brace, MD  ?pregabalin (LYRICA) 75 MG capsule Take 75 mg by mouth 2 (two) times daily. 09/19/21   [provider]  ?  sertraline (ZOLOFT) 100 MG tablet Take 100 mg by mouth daily. 09/19/19   [provider]  ?tamsulosin (FLOMAX) 0.4 MG CAPS capsule Take 0.4 mg by mouth daily after supper.  02/20/20   [provider]  ?   ? ?Allergies    ?Other, Pravachol, and Triazolam   ? ?Review of Systems   ?Review of Systems  ?Constitutional:  Negative for chills and fever.  ?HENT:  Negative for ear pain and sore throat.   ?Eyes:  Negative for pain and visual disturbance.  ?Respiratory:  Negative for cough and  shortness of breath.   ?Cardiovascular:  Negative for chest pain and palpitations.  ?Gastrointestinal:  Negative for abdominal pain and vomiting.  ?Genitourinary:  Negative for dysuria and hematuria.  ?Musculoskeletal:  Negative for arthralgias and back pain.  ?Skin:  Negative for color change and rash.  ?Neurological:  Positive for seizures and syncope.  ?All other systems reviewed and are negative. ? ?Physical Exam ?Updated Vital Signs ?BP (!) 176/67   Pulse 62   Temp 97.8 ?F (36.6 ?C) (Oral)   Resp (!) 22   Ht 6' (1.829 m)   Wt 73 kg   SpO2 100%   BMI 21.84 kg/m?  ?Physical Exam ?Vitals and nursing note reviewed.  ?Constitutional:   ?   General: He is not in acute distress. ?   Appearance: He is well-developed.  ?HENT:  ?   Head: Normocephalic and atraumatic.  ?Eyes:  ?   Conjunctiva/sclera: Conjunctivae normal.  ?Cardiovascular:  ?   Rate and Rhythm: Normal rate and regular rhythm.  ?   Heart sounds: No murmur heard. ?Pulmonary:  ?   Effort: Pulmonary effort is normal. No respiratory distress.  ?   Breath sounds: Normal breath sounds.  ?Abdominal:  ?   Palpations: Abdomen is soft.  ?   Tenderness: There is no abdominal tenderness.  ?Musculoskeletal:     ?   General: No swelling.  ?   Cervical back: Neck supple.  ?   Comments: Back: no C, T, L spine TTP, no step off or deformity ?RUE: no TTP throughout, no deformity, normal joint ROM, radial pulse intact, distal sensation and motor intact ?LUE: no TTP throughout, no deformity, normal joint ROM, radial pulse intact, distal sensation and motor intact ?RLE:  no TTP throughout, no deformity, normal joint ROM, distal pulse, sensation and motor intact ?LLE: no TTP throughout, no deformity, normal joint ROM, distal pulse, sensation and motor intact  ?Skin: ?   General: Skin is warm and dry.  ?   Capillary Refill: Capillary refill takes less than 2 seconds.  ?Neurological:  ?   Mental Status: He is alert.  ?   Comments: Alert, oriented to person place but not  year, does know the president is Biden, normal strength and sensation in all 4 extremities, speech is clear  ?Psychiatric:     ?   Mood and Affect: Mood normal.  ? ? ?ED Results / Procedures / Treatments   ?Labs ?(all labs ordered are listed, but only abnormal results are displayed) ?Labs Reviewed  ?CBC WITH DIFFERENTIAL/PLATELET - Abnormal; Notable for the following components:  ?    Result Value  ? WBC 13.6 (*)   ? Platelets 91 (*)   ? Neutro Abs 8.3 (*)   ? Monocytes Absolute 3.4 (*)   ? Abs Immature Granulocytes 0.25 (*)   ? All other components within normal limits  ?BASIC METABOLIC PANEL - Abnormal; Notable for the following components:  ?  Glucose, Bld 131 (*)   ? BUN 28 (*)   ? Creatinine, Ser 1.58 (*)   ? Calcium 8.7 (*)   ? GFR, Estimated 41 (*)   ? All other components within normal limits  ?URINALYSIS, ROUTINE W REFLEX MICROSCOPIC - Abnormal; Notable for the following components:  ? Hgb urine dipstick MODERATE (*)   ? All other components within normal limits  ?URINALYSIS, MICROSCOPIC (REFLEX) - Abnormal; Notable for the following components:  ? Bacteria, UA RARE (*)   ? All other components within normal limits  ?MAGNESIUM  ?PATHOLOGIST SMEAR REVIEW  ? ? ?EKG ?EKG Interpretation ? ?Date/Time:  Sunday Sep 25 2021 16:53:05 EDT ?Ventricular Rate:  65 ?PR Interval:  57 ?QRS Duration: 106 ?QT Interval:  434 ?QTC Calculation: 452 ?R Axis:   -82 ?Text Interpretation: Sinus rhythm Short PR interval Probable left atrial enlargement Abnormal R-wave progression, early transition Inferior infarct, old Confirmed by Madalyn Rob [phone]) on 09/25/2021 4:59:12 PM ? ?Radiology ?DG Chest 2 View ? ?Result Date: 09/25/2021 ?CLINICAL DATA:  Altered mental status. EXAM: CHEST - 2 VIEW COMPARISON:  June 23, 2019 FINDINGS: Multiple sternal wires and vascular clips are noted. The heart size and mediastinal contours are within normal limits. Both lungs are clear. The visualized skeletal structures are unremarkable.  IMPRESSION: No active cardiopulmonary disease. Electronically Signed   By: Virgina Norfolk M.D.   On: 09/25/2021 18:48  ? ?CT Head Wo Contrast ? ?Result Date: 09/25/2021 ?CLINICAL DATA:  Seizure, new-onset, no history of trauma EXAM:

## 2021-09-25 NOTE — ED Triage Notes (Signed)
Patient wife states that he fainted in the closet around 85 today. States that he had some slurred speech. Dr. Roslynn Amble is at bedside ?

## 2021-09-25 NOTE — Discharge Instructions (Signed)
I recommend following up with primary care doctor, neurologist, and cardiologist.  If he has any further episodes of passing out or seizure-like activity, please return to ER for reassessment. ? ?If he changes his mind or has any other worsening symptoms such as increasing confusion, fever, or other new concerns, please do not hesitate to come back to ER. ?

## 2021-09-25 NOTE — ED Notes (Signed)
ED Provider at bedside. 

## 2021-09-25 NOTE — ED Notes (Signed)
Upon arrival to room pt alert, oriented at baseline, pt wife reports hx dementia, and behaving per usual.  She describes episode earlier as complete LOC, found on floor, unsure exact duration but no more than 15 minutes when she last saw him.  Reports shaking, and loss of bladder control. Reports hx of MI, stents, other cardiac issues, and dementia.  Pt has visible bluish hue to fingertips, family describes as baseline.  NIHSS -0.  No findings on BEFAST screen ?

## 2021-09-27 ENCOUNTER — Ambulatory Visit (INDEPENDENT_AMBULATORY_CARE_PROVIDER_SITE_OTHER): Payer: Medicare Other | Admitting: Neurology

## 2021-09-27 ENCOUNTER — Encounter: Payer: Self-pay | Admitting: Neurology

## 2021-09-27 VITALS — BP 156/64 | HR 69 | Ht 72.0 in | Wt 156.5 lb

## 2021-09-27 DIAGNOSIS — Z85828 Personal history of other malignant neoplasm of skin: Secondary | ICD-10-CM | POA: Diagnosis not present

## 2021-09-27 DIAGNOSIS — R269 Unspecified abnormalities of gait and mobility: Secondary | ICD-10-CM

## 2021-09-27 DIAGNOSIS — F0390 Unspecified dementia without behavioral disturbance: Secondary | ICD-10-CM | POA: Diagnosis not present

## 2021-09-27 DIAGNOSIS — R569 Unspecified convulsions: Secondary | ICD-10-CM | POA: Diagnosis not present

## 2021-09-27 DIAGNOSIS — G609 Hereditary and idiopathic neuropathy, unspecified: Secondary | ICD-10-CM

## 2021-09-27 DIAGNOSIS — L570 Actinic keratosis: Secondary | ICD-10-CM | POA: Diagnosis not present

## 2021-09-27 LAB — PATHOLOGIST SMEAR REVIEW

## 2021-09-27 MED ORDER — MEMANTINE HCL 10 MG PO TABS: 10.0000 mg | ORAL_TABLET | Freq: Two times a day (BID) | ORAL | 11 refills | Status: DC

## 2021-09-27 NOTE — Patient Instructions (Signed)
Routine EEG, I will contact you to go over the result ?If abnormal will increase Depakote ?If normal will continue to observe patient and please contact me if he has another event ?Follow-up with your primary care doctor ?Return as needed. ?

## 2021-09-27 NOTE — Progress Notes (Signed)
? ?[facility] ? ?PATIENT: Patrick [name] ?DOB: 07/14/1926 ? ?REQUESTING CLINICIAN: Dykstra, Ellwood Dense, MD ?HISTORY FROM: Wife  ?REASON FOR VISIT: Seizure like activity  ? ? ?HISTORICAL ? ?CHIEF COMPLAINT:  ?Chief Complaint  ?Patient presents with  ? New Patient (Initial Visit)  ?  Rm 12. Accompanied by wife. ?NX Willis/ED referral for seizures vs syncope.  ? ? ?HISTORY OF PRESENT ILLNESS:  ?This is a 88 year old gentleman past medical history of dementia, peripheral neuropathy, hypothyroidism, who is presenting after seizure-like activity.  Wife reports on May 14 she found patient on the closet laying down initially unresponsive then later started shaking and stating that she is cold.  She is unsure how long the patient has been down.  He was confused and could not get up and he was slurring his words. He also had urinary incontinence. She took him to the hospital, initial head CT was negative for any acute abnormality, he was back to his baseline and discharge home with urgent neuro follow-up.  He is already on Depakote 500 mg for mood reason, denies any previous history of seizures and no family history of seizures.  He does have peripheral neuropathy, he is on duloxetine and Lyrica,  wife has been reporting falls due to neuropathy.  Last year he was referred to physical therapy but could not complete due to episode of hypotension likely orthostatic hypotension.  ? ? ?OTHER MEDICAL CONDITIONS: Dementia, peripheral neuropathy, hypothyroidism ? ? ?REVIEW OF SYSTEMS: Full 14 system review of systems performed and negative with exception of: as noted in the HPI  ? ?ALLERGIES: ?Allergies  ?Allergen Reactions  ? Other Other (See Comments)  ? Pravachol   ?  Unknown  ? Triazolam Other (See Comments)  ?  "Makes me crazy"  ? ? ?HOME MEDICATIONS: ?Outpatient Medications Prior to Visit  ?Medication Sig Dispense Refill  ? aspirin 81 MG tablet Take 81 mg by mouth daily.      ? divalproex (DEPAKOTE) 250  MG DR tablet Take 250 mg by mouth 2 (two) times daily.    ? donepezil (ARICEPT) 10 MG tablet Take 10 mg by mouth at bedtime.   0  ? DULoxetine (CYMBALTA) 30 MG capsule Take 30 mg by mouth daily.    ? finasteride (PROSCAR) 5 MG tablet Take 5 mg by mouth daily.    ? levothyroxine (SYNTHROID) 112 MCG tablet Take 112 mcg by mouth daily.    ? mirtazapine (REMERON) 15 MG tablet SMARTSIG:1 Pill By Mouth Every Night    ? pregabalin (LYRICA) 75 MG capsule Take 75 mg by mouth 2 (two) times daily.    ? sertraline (ZOLOFT) 100 MG tablet Take 100 mg by mouth daily.    ? Vitamin D, Ergocalciferol, (DRISDOL) 50000 units CAPS capsule Take 50,000 Units by mouth once a week.  3  ? memantine (NAMENDA) 5 MG tablet Take 10 mg by mouth in the morning and at bedtime.    ? ezetimibe (ZETIA) 10 MG tablet Take 1 tablet (10 mg total) by mouth daily. 90 tablet 3  ? nitroGLYCERIN (NITROSTAT) 0.4 MG SL tablet Place 1 tablet (0.4 mg total) under the tongue every 5 (five) minutes as needed. (Patient not taking: Reported on 09/27/2021) 75 tablet 2  ? pantoprazole (PROTONIX) 40 MG tablet Take 1 tablet (40 mg total) by mouth daily before breakfast. 90 tablet 1  ? allopurinol (ZYLOPRIM) 100 MG tablet Take 100 mg by mouth daily.    ? atorvastatin (LIPITOR) 20 MG tablet  Take 1 tablet (20 mg total) by mouth daily. 90 tablet 3  ? fludrocortisone (FLORINEF) 0.1 MG tablet Take 1 tablet (0.1 mg total) by mouth daily. 30 tablet 6  ? lidocaine (LIDODERM) 5 % Place 1 patch onto the skin daily.    ? LINZESS 72 MCG capsule Take 72 mcg by mouth as needed (constipation).    ? tamsulosin (FLOMAX) 0.4 MG CAPS capsule Take 0.4 mg by mouth daily after supper.     ? ?No facility-administered medications prior to visit.  ? ? ?PAST MEDICAL HISTORY: ?Past Medical History:  ?Diagnosis Date  ? Bilateral foot-drop 12/17/2019  ? BPH (benign prostatic hypertrophy)   ? Diverticulosis   ? ED (erectile dysfunction)   ? Gait abnormality 12/17/2019  ? GERD (gastroesophageal reflux  disease)   ? Gout   ? Hepatitis   ? hx hepatitis after mono as teenager  ? History of kidney stones   ? History of skin cancer   ? Hyperlipidemia   ? Hypertension   ? Hypothyroidism   ? IHD (ischemic heart disease)   ? Remote CABG in 1997  ? Memory disorder 12/17/2019  ? MI, acute, non ST segment elevation ([place]) 2010  ? s/p cath with occluded SVG to PD that fills by collaterals and the remainder of his revasculsarization is satisfactory. "the first time they did the stent , the second time they did the graft 8 vessels"  ? Nocturia   ? Thrombocytopenia ([place]) 11/19/2018  ? Tremor, essential 12/17/2019  ? Urinary frequency   ? ? ?PAST SURGICAL HISTORY: ?Past Surgical History:  ?Procedure Laterality Date  ? APPENDECTOMY    ? BALLOON DILATION N/A 03/25/2019  ? Procedure: BALLOON DILATION;  Surgeon: Otis Brace, MD;  Location: [facility];  Service: Gastroenterology;  Laterality: N/A;  ? CARDIAC CATHETERIZATION  09/08/2008  ? NORMAL. EF 60%; Occluded SVG to PD that fills by collaterals and the remainder of his revascularization is satisfactory. he is managed medically.   ? CORONARY ARTERY BYPASS GRAFT  1997  ? CABG x 8  ? CORONARY STENT INTERVENTION N/A 11/18/2018  ? Procedure: CORONARY STENT INTERVENTION;  Surgeon: Sherren Mocha, MD;  Location: [facility];  Service: Cardiovascular;  Laterality: N/A;  ? CYSTOSCOPY WITH INSERTION OF UROLIFT N/A 03/19/2015  ? Procedure: CYSTOSCOPY WITH INSERTION OF FOUR UROLIFTS;  Surgeon: Carolan Clines, MD;  Location: [facility];  Service: Urology;  Laterality: N/A;  ? ESOPHAGOGASTRODUODENOSCOPY (EGD) WITH PROPOFOL N/A 02/12/2017  ? Procedure: ESOPHAGOGASTRODUODENOSCOPY (EGD) WITH PROPOFOL;  Surgeon: Otis Brace, MD;  [facility]: [facility];  Service: Gastroenterology;  Laterality: N/A;  ? ESOPHAGOGASTRODUODENOSCOPY (EGD) WITH PROPOFOL N/A 07/13/2018  ? Procedure: ESOPHAGOGASTRODUODENOSCOPY (EGD) WITH PROPOFOL;  Surgeon: Clarene Essex, MD;  [facility]: [facility];  Service:  Endoscopy;  Laterality: N/A;  ? ESOPHAGOGASTRODUODENOSCOPY (EGD) WITH PROPOFOL N/A 03/25/2019  ? Procedure: ESOPHAGOGASTRODUODENOSCOPY (EGD) WITH PROPOFOL;  Surgeon: Otis Brace, MD;  [facility];  Service: Gastroenterology;  Laterality: N/A;  ? ESOPHAGOGASTRODUODENOSCOPY (EGD) WITH PROPOFOL N/A 06/28/2021  ? Procedure: ESOPHAGOGASTRODUODENOSCOPY (EGD) WITH PROPOFOL;  Surgeon: Wilford Corner, MD;  [facility]: [facility];  Service: Endoscopy;  Laterality: N/A;  ? FOREIGN BODY REMOVAL N/A 07/13/2018  ? Procedure: FOREIGN BODY REMOVAL;  Surgeon: Clarene Essex, MD;  [facility]: [facility];  Service: Endoscopy;  Laterality: N/A;  ? IMPACTION REMOVAL  06/28/2021  ? Procedure: IMPACTION REMOVAL;  Surgeon: Wilford Corner, MD;  Location: [facility];  Service: Endoscopy;;  ? LEFT HEART CATH AND CORS/GRAFTS ANGIOGRAPHY N/A 11/18/2018  ? Procedure:  LEFT HEART CATH AND CORS/GRAFTS ANGIOGRAPHY;  Surgeon: Sherren Mocha, MD;  Location: [facility];  Service: Cardiovascular;  Laterality: N/A;  ? ? ?FAMILY HISTORY: ?Family History  ?Problem Relation Age of Onset  ? Heart failure Mother 40  ? ? ?SOCIAL HISTORY: ?Social History  ? ?Socioeconomic History  ? Marital status: Married  ?  Spouse name: [name]   ? Number of children: Not on file  ? Years of education: Not on file  ? Highest education level: 12th grade  ?Occupational History  ? Occupation: Retired  ?  Employer: RETIRED  ?  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 Smoking status: Former  ?  Packs/day: 1.00  ?  Years: 15.00  ?  Pack years: 15.00  ?  Types: Cigarettes  ?  Quit date: 05/15/1958  ?  Years since quitting: 63.4  ? Smokeless tobacco: Never  ?Vaping Use  ? Vaping Use: Never used  ?Substance and Sexual Activity  ? Alcohol use: Yes  ?  Comment: occasional beer  ? Drug use: No  ? Sexual activity: Yes  ?Other Topics Concern  ? Not on file  ?Social History Narrative  ? Mr Ferns is a 88 year old retired male who lives at home with his wife, [name].     ? ?Social Determinants of Health  ? ?Financial Resource Strain: Not on file  ?Food Insecurity: Not on file  ?Transportation Needs: Not on file  ?Physical Activity: Not on file  ?Stress: Not on file  ?Social Con

## 2021-09-28 ENCOUNTER — Telehealth: Payer: Self-pay | Admitting: Neurology

## 2021-09-28 NOTE — Telephone Encounter (Signed)
His medication list showed Namenda 5 mg daily, that was the reason I sent a new prescription for 10 mg BID. But if he was already on Namenda 10 mg BID, no need for increase. ? ?Thanks

## 2021-09-28 NOTE — Telephone Encounter (Signed)
I called the pt's wife back and we discussed  message she is wanting to confirm the dosage of the pt's namenda. She sts she thought an increase in the namenda was to be made and he would take 20 mg bid. She sts the pt has been on 10 mg bid for over 1 year now and would like verify the correct dosage.  ? ? ?I did review the note from yesterday with her but she was adamant that an increase was discussed. Will fwd to MD to verify.  ?

## 2021-09-28 NOTE — Telephone Encounter (Signed)
?  PT wife calling concerning dosage of memantine (NAMENDA) 10 MG tablet. ?States at appt yesterday Dr. April Manson said he would call in '20mg'$  for pt.  ?Would like to know if this will be changing oor staying at '10mg'$ .  ?Would like a call back.  ?

## 2021-09-28 NOTE — Telephone Encounter (Signed)
I called pt's wife back and left vm ( ok per dpr). Advised to call back with questions. ? ?If pt calls back to confirm dosage, TE staff can relay.  ?

## 2021-09-28 NOTE — Telephone Encounter (Signed)
Shiner - I spoke with his wife yesterday. We already had a referral for him. We were waiting until after this appointment ?

## 2021-09-28 NOTE — Telephone Encounter (Signed)
I am working with Patrick [name] from [facility] to see if they can take this patient. ?

## 2021-10-04 ENCOUNTER — Ambulatory Visit (INDEPENDENT_AMBULATORY_CARE_PROVIDER_SITE_OTHER): Payer: Medicare Other | Admitting: Neurology

## 2021-10-04 DIAGNOSIS — R569 Unspecified convulsions: Secondary | ICD-10-CM | POA: Diagnosis not present

## 2021-10-04 NOTE — Procedures (Signed)
    History:  88 year old man with seizure like activity   EEG classification: Awake and drowsy  Description of the recording: The background rhythms of this recording consists of a fairly well modulated medium amplitude alpha rhythm of 9 Hz that is reactive to eye opening and closure. As the record progresses, the patient appears to remain in the waking state throughout the recording. Photic stimulation was performed, did not show any abnormalities. Hyperventilation was also performed, did not show any abnormalities. Toward the end of the recording, the patient enters the drowsy state with slight symmetric slowing seen. The patient never enters stage II sleep. No abnormal epileptiform discharges seen during this recording. There was no focal slowing. EKG monitor shows no evidence of cardiac rhythm abnormalities with a heart rate of 60.  Abnormality: None   Impression: This is a normal EEG recording in the waking and drowsy state. No evidence of interictal epileptiform discharges seen. A normal EEG does not exclude a diagnosis of epilepsy.    Patrick Ran, MD [facility]

## 2021-10-04 NOTE — Progress Notes (Deleted)
HPI: Follow-up coronary artery disease.  Catheterization July 2020 showed occluded LAD, circumflex and right coronary artery.  Saphenous vein graft to the right coronary artery was patent, sequential saphenous vein graft to the OM1 and distal circumflex was patent and the LIMA to the diagonal and mid LAD was patent as well.  Note there was severe stenosis in the saphenous vein graft to the distal right coronary artery which was treated with a drug-eluting stent.  ABIs January 2021 normal.  Monitor September 2021 showed sinus rhythm.  Nuclear study February 2021 showed ejection fraction 58% and no ischemia or infarction.  Has had difficulties with orthostasis treated with Florinef.  Based on previous notes he has had decline in overall health.  Echocardiogram repeated November 2022.  Ejection fraction 60 to 65%, mild left ventricular hypertrophy, grade 1 diastolic dysfunction, mild mitral regurgitation, mild aortic insufficiency.  Patient seen in the emergency room Sep 25, 2021 with possible syncope.  Head CT and chest x-ray unrevealing.  Hemoglobin 14.5 with platelet count 91,000, BUN 28 and creatinine 1.58.  Admission was recommended but patient declined.  Patient was seen by neurology and seizures felt the likely etiology.  EEG was recommended.  Since last seen   Current Outpatient Medications  Medication Sig Dispense Refill   aspirin 81 MG tablet Take 81 mg by mouth daily.       divalproex (DEPAKOTE) 250 MG DR tablet Take 250 mg by mouth 2 (two) times daily.     donepezil (ARICEPT) 10 MG tablet Take 10 mg by mouth at bedtime.   0   DULoxetine (CYMBALTA) 30 MG capsule Take 30 mg by mouth daily.     ezetimibe (ZETIA) 10 MG tablet Take 1 tablet (10 mg total) by mouth daily. 90 tablet 3   finasteride (PROSCAR) 5 MG tablet Take 5 mg by mouth daily.     levothyroxine (SYNTHROID) 112 MCG tablet Take 112 mcg by mouth daily.     memantine (NAMENDA) 10 MG tablet Take 1 tablet (10 mg total) by mouth 2  (two) times daily. 60 tablet 11   mirtazapine (REMERON) 15 MG tablet SMARTSIG:1 Pill By Mouth Every Night     nitroGLYCERIN (NITROSTAT) 0.4 MG SL tablet Place 1 tablet (0.4 mg total) under the tongue every 5 (five) minutes as needed. (Patient not taking: Reported on 09/27/2021) 75 tablet 2   pantoprazole (PROTONIX) 40 MG tablet Take 1 tablet (40 mg total) by mouth daily before breakfast. 90 tablet 1   pregabalin (LYRICA) 75 MG capsule Take 75 mg by mouth 2 (two) times daily.     sertraline (ZOLOFT) 100 MG tablet Take 100 mg by mouth daily.     Vitamin D, Ergocalciferol, (DRISDOL) 50000 units CAPS capsule Take 50,000 Units by mouth once a week.  3   No current facility-administered medications for this visit.     Past Medical History:  Diagnosis Date   Bilateral foot-drop 12/17/2019   BPH (benign prostatic hypertrophy)    Diverticulosis    ED (erectile dysfunction)    Gait abnormality 12/17/2019   GERD (gastroesophageal reflux disease)    Gout    Hepatitis    hx hepatitis after mono as teenager   History of kidney stones    History of skin cancer    Hyperlipidemia    Hypertension    Hypothyroidism    IHD (ischemic heart disease)    Remote CABG in 1997   Memory disorder 12/17/2019   MI, acute, non  ST segment elevation ([place]) 2010   s/p cath with occluded SVG to PD that fills by collaterals and the remainder of his revasculsarization is satisfactory. "the first time they did the stent , the second time they did the graft 8 vessels"   Nocturia    Thrombocytopenia ([place]) 11/19/2018   Tremor, essential 12/17/2019   Urinary frequency     Past Surgical History:  Procedure Laterality Date   APPENDECTOMY     BALLOON DILATION N/A 03/25/2019   Procedure: BALLOON DILATION;  Surgeon: Otis Brace, MD;  [facility];  Service: Gastroenterology;  Laterality: N/A;   CARDIAC CATHETERIZATION  09/08/2008   NORMAL. EF 60%; Occluded SVG to PD that fills by collaterals and the remainder of his  revascularization is satisfactory. he is managed medically.    CORONARY ARTERY BYPASS GRAFT  1997   CABG x 8   CORONARY STENT INTERVENTION N/A 11/18/2018   Procedure: CORONARY STENT INTERVENTION;  Surgeon: Sherren Mocha, MD;  Location: [facility];  Service: Cardiovascular;  Laterality: N/A;   CYSTOSCOPY WITH INSERTION OF UROLIFT N/A 03/19/2015   Procedure: CYSTOSCOPY WITH INSERTION OF FOUR UROLIFTS;  Surgeon: Carolan Clines, MD;  [facility];  Service: Urology;  Laterality: N/A;   ESOPHAGOGASTRODUODENOSCOPY (EGD) WITH PROPOFOL N/A 02/12/2017   Procedure: ESOPHAGOGASTRODUODENOSCOPY (EGD) WITH PROPOFOL;  Surgeon: Otis Brace, MD;  [facility];  Service: Gastroenterology;  Laterality: N/A;   ESOPHAGOGASTRODUODENOSCOPY (EGD) WITH PROPOFOL N/A 07/13/2018   Procedure: ESOPHAGOGASTRODUODENOSCOPY (EGD) WITH PROPOFOL;  Surgeon: Clarene Essex, MD;  [facility];  Service: Endoscopy;  Laterality: N/A;   ESOPHAGOGASTRODUODENOSCOPY (EGD) WITH PROPOFOL N/A 03/25/2019   Procedure: ESOPHAGOGASTRODUODENOSCOPY (EGD) WITH PROPOFOL;  Surgeon: Otis Brace, MD;  [facility];  Service: Gastroenterology;  Laterality: N/A;   ESOPHAGOGASTRODUODENOSCOPY (EGD) WITH PROPOFOL N/A 06/28/2021   Procedure: ESOPHAGOGASTRODUODENOSCOPY (EGD) WITH PROPOFOL;  Surgeon: Wilford Corner, MD;  [facility];  Service: Endoscopy;  Laterality: N/A;   FOREIGN BODY REMOVAL N/A 07/13/2018   Procedure: FOREIGN BODY REMOVAL;  Surgeon: Clarene Essex, MD;  [facility];  Service: Endoscopy;  Laterality: N/A;   IMPACTION REMOVAL  06/28/2021   Procedure: IMPACTION REMOVAL;  Surgeon: Wilford Corner, MD;  [facility];  Service: Endoscopy;;   LEFT HEART CATH AND CORS/GRAFTS ANGIOGRAPHY N/A 11/18/2018   Procedure: LEFT HEART CATH AND CORS/GRAFTS ANGIOGRAPHY;  Surgeon: Sherren Mocha, MD;  Location: [facility];  Service: Cardiovascular;  Laterality: N/A;     Social History   Socioeconomic History   Marital status: Married    Spouse name: [name]    Number of children: Not on file   Years of education: Not on file   Highest education level: 12th grade  Occupational History   Occupation: Retired    Fish farm manager: RETIRED    Comment: was in [employer]  Tobacco Use   Smoking status: Former    Packs/day: 1.00    Years: 15.00    Pack years: 15.00    Types: Cigarettes    Quit date: 05/15/1958    Years since quitting: 63.4   Smokeless tobacco: Never  Vaping Use   Vaping Use: Never used  Substance and Sexual Activity   Alcohol use: Yes    Comment: occasional beer   Drug use: No   Sexual activity: Yes  Other Topics Concern   Not on file  Social History Narrative   Mr Sasso is a 88 year old retired male who lives at home with his wife, [name].  Social Determinants of Health   Financial Resource Strain: Not on file  Food Insecurity: Not on file  Transportation Needs: Not on file  Physical Activity: Not on file  Stress: Not on file  Social Connections: Not on file  Intimate Partner Violence: Not on file    Family History  Problem Relation Age of Onset   Heart failure Mother 40    ROS: no fevers or chills, productive cough, hemoptysis, dysphasia, odynophagia, melena, hematochezia, dysuria, hematuria, rash, seizure activity, orthopnea, PND, pedal edema, claudication. Remaining systems are negative.  Physical Exam: Well-developed well-nourished in no acute distress.  Skin is warm and dry.  HEENT is normal.  Neck is supple.  Chest is clear to auscultation with normal expansion.  Cardiovascular exam is regular rate and rhythm.  Abdominal exam nontender or distended. No masses palpated. Extremities show no edema. neuro grossly intact  ECG-Sep 25, 2021-normal sinus rhythm, prior inferior infarct.  No change compared to September 08, 2020.  Personally reviewed  A/P  1 recent syncope-felt likely to be seizures.  He is scheduled to  follow-up with neurology and also to have an EEG.  2 coronary artery disease-patient is not having chest pain.  We will continue medical therapy with aspirin and statin.  3 hypertension-blood pressure controlled.  Note we are allowing his blood pressure to run higher as he does have a history of orthostasis.  4 hyperlipidemia-continue statin.  5 orthostatic hypotension-continue Florinef.  6 history of aortic stenosis-not evident on most recent echocardiogram.  Kirk Ruths, MD

## 2021-10-05 ENCOUNTER — Telehealth: Payer: Self-pay | Admitting: *Deleted

## 2021-10-05 DIAGNOSIS — M47816 Spondylosis without myelopathy or radiculopathy, lumbar region: Secondary | ICD-10-CM | POA: Diagnosis not present

## 2021-10-05 DIAGNOSIS — M48062 Spinal stenosis, lumbar region with neurogenic claudication: Secondary | ICD-10-CM | POA: Diagnosis not present

## 2021-10-05 DIAGNOSIS — Z681 Body mass index (BMI) 19 or less, adult: Secondary | ICD-10-CM | POA: Diagnosis not present

## 2021-10-05 DIAGNOSIS — M4316 Spondylolisthesis, lumbar region: Secondary | ICD-10-CM | POA: Diagnosis not present

## 2021-10-05 NOTE — Telephone Encounter (Signed)
-----   Message from Alric Ran, MD sent at 10/05/2021  8:17 AM EDT ----- Please call and inform patient (wife) that EEG (Brain wave test) was normal. In particular, there were no epileptiform discharges and no seizures. No further action is required on this test at this time. Continue with Depakote at the same dose. Please keep any upcoming appointments or tests and  call us with any interim questions, concerns, problems or updates. Thanks,   Alric Ran, MD

## 2021-10-05 NOTE — Telephone Encounter (Signed)
I spoke to the patient's wife and provided her the EEG results. She will continue his medication.

## 2021-10-05 NOTE — Progress Notes (Signed)
Please call and inform patient (wife) that EEG (Brain wave test) was normal. In particular, there were no epileptiform discharges and no seizures. No further action is required on this test at this time. Continue with Depakote at the same dose. Please keep any upcoming appointments or tests and  call us with any interim questions, concerns, problems or updates. Thanks,   Alric Ran, MD

## 2021-10-12 ENCOUNTER — Ambulatory Visit: Payer: Medicare Other | Admitting: Cardiology

## 2021-11-04 ENCOUNTER — Other Ambulatory Visit: Payer: Self-pay | Admitting: Cardiology

## 2021-11-04 DIAGNOSIS — E782 Mixed hyperlipidemia: Secondary | ICD-10-CM

## 2021-11-07 ENCOUNTER — Emergency Department (HOSPITAL_COMMUNITY): Payer: Medicare Other

## 2021-11-07 ENCOUNTER — Other Ambulatory Visit: Payer: Self-pay

## 2021-11-07 ENCOUNTER — Inpatient Hospital Stay (HOSPITAL_COMMUNITY)
Admission: EM | Admit: 2021-11-07 | Discharge: 2021-11-09 | DRG: 064 | Disposition: A | Discharge status: Home Health | Payer: Medicare Other | Attending: Internal Medicine | Admitting: Internal Medicine

## 2021-11-07 ENCOUNTER — Encounter (HOSPITAL_COMMUNITY): Payer: Self-pay | Admitting: Emergency Medicine

## 2021-11-07 DIAGNOSIS — Z79899 Other long term (current) drug therapy: Secondary | ICD-10-CM

## 2021-11-07 DIAGNOSIS — X30XXXA Exposure to excessive natural heat, initial encounter: Secondary | ICD-10-CM

## 2021-11-07 DIAGNOSIS — G8929 Other chronic pain: Secondary | ICD-10-CM

## 2021-11-07 DIAGNOSIS — Z888 Allergy status to other drugs, medicaments and biological substances status: Secondary | ICD-10-CM | POA: Diagnosis not present

## 2021-11-07 DIAGNOSIS — I6523 Occlusion and stenosis of bilateral carotid arteries: Secondary | ICD-10-CM | POA: Diagnosis present

## 2021-11-07 DIAGNOSIS — I951 Orthostatic hypotension: Secondary | ICD-10-CM | POA: Diagnosis not present

## 2021-11-07 DIAGNOSIS — N1832 Chronic kidney disease, stage 3b: Secondary | ICD-10-CM | POA: Diagnosis present

## 2021-11-07 DIAGNOSIS — I160 Hypertensive urgency: Secondary | ICD-10-CM

## 2021-11-07 DIAGNOSIS — R4781 Slurred speech: Secondary | ICD-10-CM | POA: Diagnosis present

## 2021-11-07 DIAGNOSIS — E039 Hypothyroidism, unspecified: Secondary | ICD-10-CM | POA: Diagnosis not present

## 2021-11-07 DIAGNOSIS — G629 Polyneuropathy, unspecified: Secondary | ICD-10-CM | POA: Diagnosis present

## 2021-11-07 DIAGNOSIS — N1831 Chronic kidney disease, stage 3a: Secondary | ICD-10-CM

## 2021-11-07 DIAGNOSIS — T675XXA Heat exhaustion, unspecified, initial encounter: Secondary | ICD-10-CM | POA: Diagnosis not present

## 2021-11-07 DIAGNOSIS — Z85828 Personal history of other malignant neoplasm of skin: Secondary | ICD-10-CM

## 2021-11-07 DIAGNOSIS — Z87442 Personal history of urinary calculi: Secondary | ICD-10-CM

## 2021-11-07 DIAGNOSIS — Z8249 Family history of ischemic heart disease and other diseases of the circulatory system: Secondary | ICD-10-CM | POA: Diagnosis not present

## 2021-11-07 DIAGNOSIS — N400 Enlarged prostate without lower urinary tract symptoms: Secondary | ICD-10-CM | POA: Diagnosis present

## 2021-11-07 DIAGNOSIS — R55 Syncope and collapse: Secondary | ICD-10-CM | POA: Diagnosis present

## 2021-11-07 DIAGNOSIS — R410 Disorientation, unspecified: Secondary | ICD-10-CM | POA: Diagnosis present

## 2021-11-07 DIAGNOSIS — N179 Acute kidney failure, unspecified: Secondary | ICD-10-CM | POA: Diagnosis present

## 2021-11-07 DIAGNOSIS — F03A0 Unspecified dementia, mild, without behavioral disturbance, psychotic disturbance, mood disturbance, and anxiety: Secondary | ICD-10-CM | POA: Diagnosis not present

## 2021-11-07 DIAGNOSIS — Z87891 Personal history of nicotine dependence: Secondary | ICD-10-CM

## 2021-11-07 DIAGNOSIS — R9431 Abnormal electrocardiogram [ECG] [EKG]: Secondary | ICD-10-CM

## 2021-11-07 DIAGNOSIS — I611 Nontraumatic intracerebral hemorrhage in hemisphere, cortical: Secondary | ICD-10-CM | POA: Diagnosis not present

## 2021-11-07 DIAGNOSIS — R413 Other amnesia: Secondary | ICD-10-CM

## 2021-11-07 DIAGNOSIS — G9341 Metabolic encephalopathy: Secondary | ICD-10-CM | POA: Diagnosis not present

## 2021-11-07 DIAGNOSIS — Z955 Presence of coronary angioplasty implant and graft: Secondary | ICD-10-CM

## 2021-11-07 DIAGNOSIS — E785 Hyperlipidemia, unspecified: Secondary | ICD-10-CM | POA: Diagnosis present

## 2021-11-07 DIAGNOSIS — I2510 Atherosclerotic heart disease of native coronary artery without angina pectoris: Secondary | ICD-10-CM | POA: Diagnosis not present

## 2021-11-07 DIAGNOSIS — K219 Gastro-esophageal reflux disease without esophagitis: Secondary | ICD-10-CM | POA: Diagnosis present

## 2021-11-07 DIAGNOSIS — I619 Nontraumatic intracerebral hemorrhage, unspecified: Secondary | ICD-10-CM | POA: Diagnosis present

## 2021-11-07 DIAGNOSIS — D181 Lymphangioma, any site: Secondary | ICD-10-CM | POA: Diagnosis present

## 2021-11-07 DIAGNOSIS — I6389 Other cerebral infarction: Secondary | ICD-10-CM | POA: Diagnosis not present

## 2021-11-07 DIAGNOSIS — T671XXA Heat syncope, initial encounter: Secondary | ICD-10-CM | POA: Diagnosis present

## 2021-11-07 DIAGNOSIS — D696 Thrombocytopenia, unspecified: Secondary | ICD-10-CM | POA: Diagnosis not present

## 2021-11-07 DIAGNOSIS — I459 Conduction disorder, unspecified: Secondary | ICD-10-CM | POA: Diagnosis present

## 2021-11-07 DIAGNOSIS — R251 Tremor, unspecified: Secondary | ICD-10-CM | POA: Diagnosis present

## 2021-11-07 DIAGNOSIS — G9340 Encephalopathy, unspecified: Secondary | ICD-10-CM

## 2021-11-07 DIAGNOSIS — I129 Hypertensive chronic kidney disease with stage 1 through stage 4 chronic kidney disease, or unspecified chronic kidney disease: Secondary | ICD-10-CM | POA: Diagnosis present

## 2021-11-07 DIAGNOSIS — I959 Hypotension, unspecified: Secondary | ICD-10-CM | POA: Diagnosis not present

## 2021-11-07 DIAGNOSIS — R531 Weakness: Secondary | ICD-10-CM | POA: Diagnosis not present

## 2021-11-07 DIAGNOSIS — Z7982 Long term (current) use of aspirin: Secondary | ICD-10-CM

## 2021-11-07 DIAGNOSIS — I639 Cerebral infarction, unspecified: Secondary | ICD-10-CM | POA: Diagnosis not present

## 2021-11-07 DIAGNOSIS — I25810 Atherosclerosis of coronary artery bypass graft(s) without angina pectoris: Secondary | ICD-10-CM | POA: Diagnosis present

## 2021-11-07 DIAGNOSIS — Z8673 Personal history of transient ischemic attack (TIA), and cerebral infarction without residual deficits: Secondary | ICD-10-CM

## 2021-11-07 DIAGNOSIS — I6381 Other cerebral infarction due to occlusion or stenosis of small artery: Secondary | ICD-10-CM | POA: Diagnosis not present

## 2021-11-07 DIAGNOSIS — Z79890 Hormone replacement therapy (postmenopausal): Secondary | ICD-10-CM

## 2021-11-07 LAB — CBC WITH DIFFERENTIAL/PLATELET
Abs Immature Granulocytes: 0.23 10*3/uL — ABNORMAL HIGH (ref 0.00–0.07)
Basophils Absolute: 0 10*3/uL (ref 0.0–0.1)
Basophils Relative: 0 %
Eosinophils Absolute: 0 10*3/uL (ref 0.0–0.5)
Eosinophils Relative: 0 %
HCT: 43.8 % (ref 39.0–52.0)
Hemoglobin: 14.3 g/dL (ref 13.0–17.0)
Immature Granulocytes: 5 %
Lymphocytes Relative: 15 %
Lymphs Abs: 0.7 10*3/uL (ref 0.7–4.0)
MCH: 32.1 pg (ref 26.0–34.0)
MCHC: 32.6 g/dL (ref 30.0–36.0)
MCV: 98.4 fL (ref 80.0–100.0)
Monocytes Absolute: 1.1 10*3/uL — ABNORMAL HIGH (ref 0.1–1.0)
Monocytes Relative: 22 %
Neutro Abs: 2.8 10*3/uL (ref 1.7–7.7)
Neutrophils Relative %: 58 %
Platelets: 83 10*3/uL — ABNORMAL LOW (ref 150–400)
RBC: 4.45 MIL/uL (ref 4.22–5.81)
RDW: 13.7 % (ref 11.5–15.5)
WBC: 4.9 10*3/uL (ref 4.0–10.5)
nRBC: 0 % (ref 0.0–0.2)

## 2021-11-07 LAB — BASIC METABOLIC PANEL
Anion gap: 10 (ref 5–15)
BUN: 21 mg/dL (ref 8–23)
CO2: 23 mmol/L (ref 22–32)
Calcium: 8.9 mg/dL (ref 8.9–10.3)
Chloride: 108 mmol/L (ref 98–111)
Creatinine, Ser: 1.29 mg/dL — ABNORMAL HIGH (ref 0.61–1.24)
GFR, Estimated: 52 mL/min — ABNORMAL LOW (ref >60)
Glucose, Bld: 125 mg/dL — ABNORMAL HIGH (ref 70–99)
Potassium: 4.6 mmol/L (ref 3.5–5.1)
Sodium: 141 mmol/L (ref 135–145)

## 2021-11-07 LAB — D-DIMER, QUANTITATIVE: D-Dimer, Quant: 0.38 ug/mL-FEU (ref 0.00–0.50)

## 2021-11-07 LAB — CBG MONITORING, ED: Glucose-Capillary: 124 mg/dL — ABNORMAL HIGH (ref 70–99)

## 2021-11-07 LAB — TROPONIN I (HIGH SENSITIVITY): Troponin I (High Sensitivity): 9 ng/L (ref <18)

## 2021-11-07 LAB — MAGNESIUM: Magnesium: 2.2 mg/dL (ref 1.7–2.4)

## 2021-11-07 MED ORDER — TAMSULOSIN HCL 0.4 MG PO CAPS: 0.4000 mg | ORAL_CAPSULE | Freq: Every evening | ORAL | Status: DC | PRN

## 2021-11-07 MED ORDER — LABETALOL HCL 5 MG/ML IV SOLN: 10.0000 mg | INTRAVENOUS | Status: DC | PRN

## 2021-11-07 MED ORDER — ASPIRIN 81 MG PO CHEW: 81.0000 mg | CHEWABLE_TABLET | Freq: Every day | ORAL | Status: DC

## 2021-11-07 MED ORDER — PANTOPRAZOLE SODIUM 40 MG PO TBEC
40.0000 mg | DELAYED_RELEASE_TABLET | Freq: Every day | ORAL | Status: DC
  Administered 2021-11-08 – 2021-11-09 (×2): 40 mg via ORAL
  Filled 2021-11-07 (×2): qty 1

## 2021-11-07 MED ORDER — DIVALPROEX SODIUM 250 MG PO DR TAB
250.0000 mg | DELAYED_RELEASE_TABLET | Freq: Two times a day (BID) | ORAL | Status: DC
  Administered 2021-11-08 – 2021-11-09 (×4): 250 mg via ORAL
  Filled 2021-11-07 (×5): qty 1

## 2021-11-07 MED ORDER — DONEPEZIL HCL 10 MG PO TABS: 10.0000 mg | ORAL_TABLET | Freq: Every day | ORAL | Status: DC

## 2021-11-07 MED ORDER — SERTRALINE HCL 100 MG PO TABS
100.0000 mg | ORAL_TABLET | Freq: Every day | ORAL | Status: DC
  Administered 2021-11-08 – 2021-11-09 (×2): 100 mg via ORAL
  Filled 2021-11-07 (×2): qty 1

## 2021-11-07 MED ORDER — HEPARIN SODIUM (PORCINE) 5000 UNIT/ML IJ SOLN
5000.0000 [IU] | Freq: Three times a day (TID) | INTRAMUSCULAR | Status: DC
  Administered 2021-11-07: 5000 [IU] via SUBCUTANEOUS
  Filled 2021-11-07: qty 1

## 2021-11-07 MED ORDER — FINASTERIDE 5 MG PO TABS
5.0000 mg | ORAL_TABLET | Freq: Every day | ORAL | Status: DC
  Administered 2021-11-08 – 2021-11-09 (×2): 5 mg via ORAL
  Filled 2021-11-07 (×2): qty 1

## 2021-11-07 MED ORDER — SODIUM CHLORIDE 0.9 % IV SOLN: INTRAVENOUS | Status: DC

## 2021-11-07 MED ORDER — MIRTAZAPINE 15 MG PO TABS: 15.0000 mg | ORAL_TABLET | Freq: Every day | ORAL | Status: DC

## 2021-11-07 MED ORDER — MEMANTINE HCL 10 MG PO TABS
10.0000 mg | ORAL_TABLET | Freq: Every day | ORAL | Status: DC
  Administered 2021-11-07 – 2021-11-08 (×2): 10 mg via ORAL
  Filled 2021-11-07 (×3): qty 1

## 2021-11-07 MED ORDER — ALLOPURINOL 100 MG PO TABS
100.0000 mg | ORAL_TABLET | Freq: Every day | ORAL | Status: DC
  Administered 2021-11-08 – 2021-11-09 (×2): 100 mg via ORAL
  Filled 2021-11-07 (×2): qty 1

## 2021-11-07 MED ORDER — LIDOCAINE 5 % EX PTCH
1.0000 | MEDICATED_PATCH | Freq: Every day | CUTANEOUS | Status: DC
  Administered 2021-11-08 – 2021-11-09 (×2): 1 via TRANSDERMAL
  Filled 2021-11-07 (×2): qty 1

## 2021-11-07 MED ORDER — SODIUM CHLORIDE 0.9 % IV BOLUS
1000.0000 mL | Freq: Once | INTRAVENOUS | Status: AC
Start: 2021-11-07 — End: 2021-11-07
  Administered 2021-11-07: 1000 mL via INTRAVENOUS

## 2021-11-07 MED ORDER — SODIUM CHLORIDE 0.9% FLUSH
3.0000 mL | Freq: Two times a day (BID) | INTRAVENOUS | Status: DC
  Administered 2021-11-07 – 2021-11-09 (×3): 3 mL via INTRAVENOUS

## 2021-11-07 MED ORDER — LEVOTHYROXINE SODIUM 112 MCG PO TABS
112.0000 ug | ORAL_TABLET | Freq: Every day | ORAL | Status: DC
  Administered 2021-11-08 – 2021-11-09 (×2): 112 ug via ORAL
  Filled 2021-11-07 (×2): qty 1

## 2021-11-07 MED ORDER — PREGABALIN 75 MG PO CAPS
75.0000 mg | ORAL_CAPSULE | Freq: Two times a day (BID) | ORAL | Status: DC
  Administered 2021-11-07 – 2021-11-09 (×4): 75 mg via ORAL
  Filled 2021-11-07 (×4): qty 1

## 2021-11-07 MED ORDER — ACETAMINOPHEN 650 MG RE SUPP: 650.0000 mg | Freq: Four times a day (QID) | RECTAL | Status: DC | PRN

## 2021-11-07 MED ORDER — DULOXETINE HCL 30 MG PO CPEP
30.0000 mg | ORAL_CAPSULE | Freq: Every day | ORAL | Status: DC
  Administered 2021-11-08 – 2021-11-09 (×2): 30 mg via ORAL
  Filled 2021-11-07 (×2): qty 1

## 2021-11-07 MED ORDER — SENNOSIDES-DOCUSATE SODIUM 8.6-50 MG PO TABS
1.0000 | ORAL_TABLET | Freq: Every evening | ORAL | Status: DC | PRN
Start: 2021-11-07 — End: 2021-11-09

## 2021-11-07 MED ORDER — POLYVINYL ALCOHOL 1.4 % OP SOLN: 1.0000 [drp] | Freq: Two times a day (BID) | OPHTHALMIC | Status: DC | PRN

## 2021-11-07 MED ORDER — ACETAMINOPHEN 325 MG PO TABS
650.0000 mg | ORAL_TABLET | Freq: Four times a day (QID) | ORAL | Status: DC | PRN
  Administered 2021-11-07 – 2021-11-08 (×2): 650 mg via ORAL
  Filled 2021-11-07 (×2): qty 2

## 2021-11-08 ENCOUNTER — Observation Stay (HOSPITAL_COMMUNITY): Payer: Medicare Other

## 2021-11-08 ENCOUNTER — Inpatient Hospital Stay (HOSPITAL_COMMUNITY): Payer: Medicare Other

## 2021-11-08 DIAGNOSIS — I639 Cerebral infarction, unspecified: Secondary | ICD-10-CM | POA: Diagnosis not present

## 2021-11-08 DIAGNOSIS — Z87442 Personal history of urinary calculi: Secondary | ICD-10-CM | POA: Diagnosis not present

## 2021-11-08 DIAGNOSIS — I129 Hypertensive chronic kidney disease with stage 1 through stage 4 chronic kidney disease, or unspecified chronic kidney disease: Secondary | ICD-10-CM | POA: Diagnosis present

## 2021-11-08 DIAGNOSIS — G9341 Metabolic encephalopathy: Secondary | ICD-10-CM | POA: Diagnosis present

## 2021-11-08 DIAGNOSIS — I6389 Other cerebral infarction: Secondary | ICD-10-CM | POA: Diagnosis not present

## 2021-11-08 DIAGNOSIS — N1831 Chronic kidney disease, stage 3a: Secondary | ICD-10-CM | POA: Diagnosis not present

## 2021-11-08 DIAGNOSIS — G8929 Other chronic pain: Secondary | ICD-10-CM | POA: Diagnosis present

## 2021-11-08 DIAGNOSIS — I619 Nontraumatic intracerebral hemorrhage, unspecified: Secondary | ICD-10-CM | POA: Diagnosis not present

## 2021-11-08 DIAGNOSIS — D696 Thrombocytopenia, unspecified: Secondary | ICD-10-CM | POA: Diagnosis present

## 2021-11-08 DIAGNOSIS — R410 Disorientation, unspecified: Secondary | ICD-10-CM | POA: Diagnosis present

## 2021-11-08 DIAGNOSIS — I6523 Occlusion and stenosis of bilateral carotid arteries: Secondary | ICD-10-CM | POA: Diagnosis present

## 2021-11-08 DIAGNOSIS — I951 Orthostatic hypotension: Secondary | ICD-10-CM

## 2021-11-08 DIAGNOSIS — R251 Tremor, unspecified: Secondary | ICD-10-CM | POA: Diagnosis present

## 2021-11-08 DIAGNOSIS — Z888 Allergy status to other drugs, medicaments and biological substances status: Secondary | ICD-10-CM | POA: Diagnosis not present

## 2021-11-08 DIAGNOSIS — I611 Nontraumatic intracerebral hemorrhage in hemisphere, cortical: Secondary | ICD-10-CM | POA: Diagnosis present

## 2021-11-08 DIAGNOSIS — K219 Gastro-esophageal reflux disease without esophagitis: Secondary | ICD-10-CM | POA: Diagnosis present

## 2021-11-08 DIAGNOSIS — N1832 Chronic kidney disease, stage 3b: Secondary | ICD-10-CM | POA: Diagnosis present

## 2021-11-08 DIAGNOSIS — N179 Acute kidney failure, unspecified: Secondary | ICD-10-CM | POA: Diagnosis present

## 2021-11-08 DIAGNOSIS — X30XXXA Exposure to excessive natural heat, initial encounter: Secondary | ICD-10-CM | POA: Diagnosis not present

## 2021-11-08 DIAGNOSIS — R9431 Abnormal electrocardiogram [ECG] [EKG]: Secondary | ICD-10-CM | POA: Diagnosis not present

## 2021-11-08 DIAGNOSIS — R55 Syncope and collapse: Secondary | ICD-10-CM | POA: Diagnosis present

## 2021-11-08 DIAGNOSIS — Z8249 Family history of ischemic heart disease and other diseases of the circulatory system: Secondary | ICD-10-CM | POA: Diagnosis not present

## 2021-11-08 DIAGNOSIS — I6381 Other cerebral infarction due to occlusion or stenosis of small artery: Secondary | ICD-10-CM | POA: Diagnosis not present

## 2021-11-08 DIAGNOSIS — I160 Hypertensive urgency: Secondary | ICD-10-CM | POA: Diagnosis present

## 2021-11-08 DIAGNOSIS — N400 Enlarged prostate without lower urinary tract symptoms: Secondary | ICD-10-CM | POA: Diagnosis present

## 2021-11-08 DIAGNOSIS — Z85828 Personal history of other malignant neoplasm of skin: Secondary | ICD-10-CM | POA: Diagnosis not present

## 2021-11-08 DIAGNOSIS — R4781 Slurred speech: Secondary | ICD-10-CM | POA: Diagnosis present

## 2021-11-08 DIAGNOSIS — E785 Hyperlipidemia, unspecified: Secondary | ICD-10-CM | POA: Diagnosis present

## 2021-11-08 DIAGNOSIS — I25810 Atherosclerosis of coronary artery bypass graft(s) without angina pectoris: Secondary | ICD-10-CM | POA: Diagnosis present

## 2021-11-08 DIAGNOSIS — F03A0 Unspecified dementia, mild, without behavioral disturbance, psychotic disturbance, mood disturbance, and anxiety: Secondary | ICD-10-CM | POA: Diagnosis present

## 2021-11-08 DIAGNOSIS — I2510 Atherosclerotic heart disease of native coronary artery without angina pectoris: Secondary | ICD-10-CM | POA: Diagnosis present

## 2021-11-08 DIAGNOSIS — E039 Hypothyroidism, unspecified: Secondary | ICD-10-CM | POA: Diagnosis present

## 2021-11-08 LAB — CBC
HCT: 39 % (ref 39.0–52.0)
Hemoglobin: 13.3 g/dL (ref 13.0–17.0)
MCH: 32.8 pg (ref 26.0–34.0)
MCHC: 34.1 g/dL (ref 30.0–36.0)
MCV: 96.3 fL (ref 80.0–100.0)
Platelets: 79 10*3/uL — ABNORMAL LOW (ref 150–400)
RBC: 4.05 MIL/uL — ABNORMAL LOW (ref 4.22–5.81)
RDW: 13.8 % (ref 11.5–15.5)
WBC: 14.7 10*3/uL — ABNORMAL HIGH (ref 4.0–10.5)
nRBC: 0 % (ref 0.0–0.2)

## 2021-11-08 LAB — URINALYSIS, ROUTINE W REFLEX MICROSCOPIC
Bacteria, UA: NONE SEEN
Bilirubin Urine: NEGATIVE
Glucose, UA: NEGATIVE mg/dL
Ketones, ur: NEGATIVE mg/dL
Leukocytes,Ua: NEGATIVE
Nitrite: NEGATIVE
Protein, ur: NEGATIVE mg/dL
Specific Gravity, Urine: 1.019 (ref 1.005–1.030)
pH: 5 (ref 5.0–8.0)

## 2021-11-08 LAB — BASIC METABOLIC PANEL
Anion gap: 8 (ref 5–15)
BUN: 20 mg/dL (ref 8–23)
CO2: 21 mmol/L — ABNORMAL LOW (ref 22–32)
Calcium: 8.3 mg/dL — ABNORMAL LOW (ref 8.9–10.3)
Chloride: 108 mmol/L (ref 98–111)
Creatinine, Ser: 1.18 mg/dL (ref 0.61–1.24)
GFR, Estimated: 58 mL/min — ABNORMAL LOW (ref >60)
Glucose, Bld: 131 mg/dL — ABNORMAL HIGH (ref 70–99)
Potassium: 3.9 mmol/L (ref 3.5–5.1)
Sodium: 137 mmol/L (ref 135–145)

## 2021-11-08 LAB — TSH: TSH: 0.646 u[IU]/mL (ref 0.350–4.500)

## 2021-11-08 LAB — ABO/RH: ABO/RH(D): O POS

## 2021-11-08 LAB — AMMONIA: Ammonia: 28 umol/L (ref 9–35)

## 2021-11-08 LAB — TYPE AND SCREEN
ABO/RH(D): O POS
Antibody Screen: NEGATIVE

## 2021-11-08 LAB — VITAMIN B12: Vitamin B-12: 353 pg/mL (ref 180–914)

## 2021-11-08 LAB — RPR: RPR Ser Ql: NONREACTIVE

## 2021-11-08 LAB — MAGNESIUM: Magnesium: 2 mg/dL (ref 1.7–2.4)

## 2021-11-08 MED ORDER — SODIUM CHLORIDE 0.9% IV SOLUTION
Freq: Once | INTRAVENOUS | Status: AC
Start: 2021-11-08 — End: 2021-11-08

## 2021-11-08 MED ORDER — LABETALOL HCL 5 MG/ML IV SOLN: 10.0000 mg | INTRAVENOUS | Status: DC | PRN

## 2021-11-08 MED ORDER — OXYCODONE HCL 5 MG PO TABS
5.0000 mg | ORAL_TABLET | Freq: Three times a day (TID) | ORAL | Status: DC | PRN
  Administered 2021-11-08: 5 mg via ORAL
  Filled 2021-11-08: qty 1

## 2021-11-08 MED ORDER — FLUDROCORTISONE ACETATE 0.1 MG PO TABS
0.1000 mg | ORAL_TABLET | Freq: Every day | ORAL | Status: DC
  Administered 2021-11-08 – 2021-11-09 (×2): 0.1 mg via ORAL
  Filled 2021-11-08 (×2): qty 1

## 2021-11-08 MED ORDER — GADOBUTROL 1 MMOL/ML IV SOLN
8.5000 mL | Freq: Once | INTRAVENOUS | Status: AC | PRN
  Administered 2021-11-08: 8.5 mL via INTRAVENOUS

## 2021-11-08 MED ORDER — OXYCODONE-ACETAMINOPHEN 5-325 MG PO TABS: 1.0000 | ORAL_TABLET | Freq: Three times a day (TID) | ORAL | Status: DC | PRN

## 2021-11-08 MED ORDER — SODIUM CHLORIDE 0.9 % IV SOLN: INTRAVENOUS | Status: DC

## 2021-11-08 NOTE — Progress Notes (Signed)
Mobility Specialist Progress Note:   11/08/21 1610  Mobility  Activity Transferred from chair to bed  Level of Assistance Minimal assist, patient does 75% or more  Assistive Device Other (Comment) (HHA)  Distance Ambulated (ft) 3 ft  Activity Response Tolerated well  $Mobility charge 1 Mobility   Responded to bed alarm, found pt attempting to transfer back to bed independently. Required HHA throughout. Pt back in bed, reminded to call for NSG when wanting to get OOB d/t high fall risk, pt voiced understanding. Bed alarm on.   [facility] Secure Chat or Office Phone: [phone]

## 2021-11-08 NOTE — Consult Note (Addendum)
NEUROHOSPITALISTS-CONSULTATION NOTE    Requesting Physician: Patrick [name]    Admit date: 11/07/2021    Chief Complaint: Lethargy and slurred speech   History obtained from:  Patient and Chart      HPI      Patrick [name] is a pleasant 88 y.o. male with medical history significant for dementia on Aricept and Namenda, CAD on ASA 81mg  prior to admission, peripheral neuropathy, orthostasis previously on florinef, hypothyroidism, CKD 3B, chronic thrombocytopenia, and tremor who presented to the emergency department with lethargy and slurred speech via EMS. Per wife he sat out in sun a lot over past couple of days x 3 hours then 1.5 hours in heat of hot days. She called EMS, he was given IVF and  cooling measures with mental status improvement prior to arrival in the ED. He is very tremulous in the ED and his wife reports that she had found him approximately 6 weeks ago lying on the floor of their closet shaking in a similar fashion.  He had a work-up at that time that included negative head CT in the ED, and outpatient neurology consultation at [facility] with normal EEG.  -EKG with QTc 504 ms.   CBC with chronic thrombocytopenia, platelets 83,000.   -Per chart review cardiology has allowed more permessive BP management in the past d/t orthostasis and he was on florinef previously -Wife reports baseline is functionally independent with ADLs, usually unaware of dates and times. Has been advised to use a walker several times but he refuses.   Past Medical History    Past Medical History:  Diagnosis Date   Bilateral foot-drop 12/17/2019   BPH (benign prostatic hypertrophy)    Diverticulosis    ED (erectile dysfunction)    Gait abnormality 12/17/2019   GERD (gastroesophageal reflux disease)    Gout    Hepatitis    hx hepatitis after mono as teenager   History of kidney stones    History of skin cancer    Hyperlipidemia    Hypertension    Hypothyroidism    IHD (ischemic heart  disease)    Remote CABG in 1997   Memory disorder 12/17/2019   MI, acute, non ST segment elevation ([name]) 2010   s/p cath with occluded SVG to PD that fills by collaterals and the remainder of his revasculsarization is satisfactory. "the first time they did the stent , the second time they did the graft 8 vessels"   Nocturia    Thrombocytopenia ([name]) 11/19/2018   Tremor, essential 12/17/2019   Urinary frequency      Past Surgical History   Past Surgical History:  Procedure Laterality Date   APPENDECTOMY     BALLOON DILATION N/A 03/25/2019   Procedure: BALLOON DILATION;  Surgeon: Kathi Der, MD;  Location: [facility];  Service: Gastroenterology;  Laterality: N/A;   CARDIAC CATHETERIZATION  09/08/2008   NORMAL. EF 60%; Occluded SVG to PD that fills by collaterals and the remainder of his revascularization is satisfactory. he is managed medically.    CORONARY ARTERY BYPASS GRAFT  1997   CABG x 8   CORONARY STENT INTERVENTION N/A 11/18/2018   Procedure: CORONARY STENT INTERVENTION;  Surgeon: Tonny Bollman, MD;  Location: [facility] INVASIVE CV LAB;  Service: Cardiovascular;  Laterality: N/A;   CYSTOSCOPY WITH INSERTION OF UROLIFT N/A 03/19/2015   Procedure: CYSTOSCOPY WITH INSERTION OF FOUR UROLIFTS;  Surgeon: Jethro Bolus, MD;  Location: [facility] ORS;  Service: Urology;  Laterality: N/A;   ESOPHAGOGASTRODUODENOSCOPY (EGD) WITH PROPOFOL N/A  02/12/2017   Procedure: ESOPHAGOGASTRODUODENOSCOPY (EGD) WITH PROPOFOL;  Surgeon: Kathi Der, MD;  Location: [facility];  Service: Gastroenterology;  Laterality: N/A;   ESOPHAGOGASTRODUODENOSCOPY (EGD) WITH PROPOFOL N/A 07/13/2018   Procedure: ESOPHAGOGASTRODUODENOSCOPY (EGD) WITH PROPOFOL;  Surgeon: Vida Rigger, MD;  Location: [facility];  Service: Endoscopy;  Laterality: N/A;   ESOPHAGOGASTRODUODENOSCOPY (EGD) WITH PROPOFOL N/A 03/25/2019   Procedure: ESOPHAGOGASTRODUODENOSCOPY (EGD) WITH PROPOFOL;  Surgeon: Kathi Der, MD;  Location: [facility]OS[facility];  Service: Gastroenterology;  Laterality: N/A;   ESOPHAGOGASTRODUODENOSCOPY (EGD) WITH PROPOFOL N/A 06/28/2021   Procedure: ESOPHAGOGASTRODUODENOSCOPY (EGD) WITH PROPOFOL;  Surgeon: Charlott Rakes, MD;  Location: [facility];  Service: Endoscopy;  Laterality: N/A;   FOREIGN BODY REMOVAL N/A 07/13/2018   Procedure: FOREIGN BODY REMOVAL;  Surgeon: Vida Rigger, MD;  Location: [facility];  Service: Endoscopy;  Laterality: N/A;   IMPACTION REMOVAL  06/28/2021   Procedure: IMPACTION REMOVAL;  Surgeon: Charlott Rakes, MD;  Location: [facility];  Service: Endoscopy;;   LEFT HEART CATH AND CORS/GRAFTS ANGIOGRAPHY N/A 11/18/2018   Procedure: LEFT HEART CATH AND CORS/GRAFTS ANGIOGRAPHY;  Surgeon: Tonny Bollman, MD;  Location: [facility] INVASIVE CV LAB;  Service: Cardiovascular;  Laterality: N/A;     Family History   Family History  Problem Relation Age of Onset   Heart failure Mother 45    Social History  Social History:  reports that he quit smoking about 63 years ago. His smoking use included cigarettes. He has a 15.00 pack-year smoking history. He has never used smokeless tobacco. He reports current alcohol use. He reports that he does not use drugs.   Allergies  Allergies:  Allergies  Allergen Reactions   Other Other (See Comments)   Pravachol     Unknown   Triazolam Other (See Comments)    "Makes me crazy"     Medications Prior to Admission    No current facility-administered medications on file prior to encounter.   Current Outpatient Medications on File Prior to Encounter  Medication Sig Dispense Refill   allopurinol (ZYLOPRIM) 100 MG tablet Take 100 mg by mouth daily.     aspirin 81 MG tablet Take 81 mg by mouth daily.       diphenhydramine-acetaminophen (TYLENOL PM) 25-500 MG TABS tablet Take 1 tablet by mouth at bedtime as needed (sleep).     divalproex (DEPAKOTE) 250 MG DR tablet Take 250 mg by mouth 2 (two) times daily.     donepezil (ARICEPT) 10 MG tablet Take  10 mg by mouth at bedtime.   0   DULoxetine (CYMBALTA) 30 MG capsule Take 30 mg by mouth daily.     ezetimibe (ZETIA) 10 MG tablet TAKE 1 TABLET BY MOUTH EVERY DAY (Patient taking differently: Take 10 mg by mouth daily.) 90 tablet 1   finasteride (PROSCAR) 5 MG tablet Take 5 mg by mouth daily.     levothyroxine (SYNTHROID) 112 MCG tablet Take 112 mcg by mouth daily.     lidocaine (LIDODERM) 5 % Place 1 patch onto the skin daily.     memantine (NAMENDA) 10 MG tablet Take 1 tablet (10 mg total) by mouth 2 (two) times daily. (Patient taking differently: Take 10 mg by mouth at bedtime.) 60 tablet 11   mirtazapine (REMERON) 15 MG tablet Take 15 mg by mouth at bedtime.     nitroGLYCERIN (NITROSTAT) 0.4 MG SL tablet Place 1 tablet (0.4 mg total) under the tongue every 5 (five) minutes as needed. (Patient taking differently: Place 0.4 mg under the tongue every 5 (  five) minutes as needed for chest pain.) 75 tablet 2   oxyCODONE-acetaminophen (PERCOCET) 10-325 MG tablet Take 1 tablet by mouth 3 (three) times daily as needed for pain.     pantoprazole (PROTONIX) 40 MG tablet Take 1 tablet (40 mg total) by mouth daily before breakfast. 90 tablet 1   Polyethyl Glycol-Propyl Glycol (SYSTANE OP) Place 1 drop into both eyes 2 (two) times daily as needed (dry eyes).     pregabalin (LYRICA) 75 MG capsule Take 75 mg by mouth 2 (two) times daily.     sertraline (ZOLOFT) 100 MG tablet Take 100 mg by mouth daily.     tamsulosin (FLOMAX) 0.4 MG CAPS capsule Take 0.4 mg by mouth at bedtime as needed (when finasteride does not work).     Vitamin D, Ergocalciferol, (DRISDOL) 50000 units CAPS capsule Take 50,000 Units by mouth every Monday.  3    Scheduled:  allopurinol  100 mg Oral Daily   divalproex  250 mg Oral BID   DULoxetine  30 mg Oral Daily   finasteride  5 mg Oral Daily   fludrocortisone  0.1 mg Oral Daily   levothyroxine  112 mcg Oral Q0600   lidocaine  1 patch Transdermal Daily   memantine  10 mg Oral QHS    pantoprazole  40 mg Oral QAC breakfast   pregabalin  75 mg Oral BID   sertraline  100 mg Oral Daily   sodium chloride flush  3 mL Intravenous Q12H   Continuous:  sodium chloride 75 mL/hr at 11/08/21 1254     ROS  As per HPI.    Physical Examination          Vitals:    08/05/17 1445 08/05/17 1515 08/05/17 1545 08/05/17 1600  BP: 103/68 (!) 93/58 118/76 (!) 120/101  Pulse: (!) 58 (!) 56 (!) 59 (!) 59  Resp: 17 15 16 14   Temp:          TempSrc:          SpO2: 98% 99% 100% 100%  Weight:          Height:               Neurological Examination  Blood pressure (!) 135/53, pulse 68, temperature 97.8 F (36.6 C), temperature source Oral, resp. rate 18, height 6' (1.829 m), weight 81.6 kg, SpO2 98 %.  HEENT-  Normocephalic, no lesions, without obvious abnormality.  Normal external eye and conjunctiva.   Cardiovascular- S1-S2 audible, pulses palpable throughout   Lungs-no rhonchi or wheezing noted, no excessive working breathing.  Saturations within normal limits Abdomen- All 4 quadrants palpated and nontender Extremities- Warm, dry and intact Musculoskeletal-no joint tenderness, deformity or swelling Skin-warm and dry, no hyperpigmentation, vitiligo, or suspicious lesions  Neurological Examination Mental Status: Alert, disoriented to time/date otherwise oriented to person, situation and place.  Thought content is inappropriate at times.  Speech fluent without evidence of aphasia.  Able to follow 2 step commands without difficulty.  Cranial Nerves: II: Visual fields grossly normal,  III,IV, VI: ptosis not present, extra-ocular motions intact bilaterally, pupils equal, round, reactive to light and accommodation V,VII: smile symmetric, facial light touch sensation normal bilaterally VIII: hearing normal bilaterally IX,X: uvula rises symmetrically XI: bilateral shoulder shrug XII: midline tongue extension Motor: Right : Upper extremity   5/5    Left:     Upper extremity    5/5  Lower extremity   5/5     Lower extremity   5/5  Tone and bulk:normal tone throughout; no atrophy noted Sensory: Intact to light touch bilat upper extremities, impaired in sock distribution  Deep Tendon Reflexes: 2+ and symmetric throughout Plantars: Right: downgoing   Left: downgoing Cerebellar: Bilat fine tremor of hands with intention. No ataxia with finger-to-nose bilaterally. Abnormal heel-to-shin test with impaired coordination bilaterally Gait: Deferred in setting of orthostasis    Laboratory Results   Recent Labs  Lab 11/07/21 1731 11/07/21 1801 11/08/21 0250  NA 141  --  137  K 4.6  --  3.9  CL 108  --  108  CO2 23  --  21*  GLUCOSE 125*  --  131*  BUN 21  --  20  CREATININE 1.29*  --  1.18  CALCIUM 8.9  --  8.3*  MG  --  2.2 2.0    No results for input(s): "CHOL", "TRIG", "HDL", "CHOLHDL", "VLDL", "LDLCALC" in the last 168 hours.   Recent Labs  Lab 11/07/21 1744  GLUCAP 124*     Imaging Results   Imaging: CT head 6/25 1. 7 mm acute intraparenchymal hemorrhage at the parasagittal posterior left frontal lobe. No associated mass effect. 2. No other acute intracranial abnormality. 3. Generalized cerebral atrophy with moderately advanced chronic small vessel ischemic disease. Remote lacunar infarct at the right basal ganglia.   IMPRESSION AND PLAN  88 yo male with above PMH significant for dementia, orthostasis, CAD, chronic thrombocytopenia (PLT 83,000 on admission) on ASA 81mg  PTA admitted d/t lethargy and altered mental status after sitting outside in the sun x 3 hours with temperatures in high 80s. CT head revealed an acute 7mm IPH in the parasagittal posterior left frontal lobe. He was orthostatic in the ED and was given IVF and cooling measures with noted improvement in mental status.   PLAN    - Much improved neurologically with wife endorsing he is at his baseline - Hold all anticoagulation antiplatelet medications for now. - If MRI brain shows  no contraindication and if hemorrhage is stable, will need to restart ASA as the patient has a cardiac stent. Overall risk of morbidity/mortality due to MI off ASA is greater than risk of recurrent hemorrhage on ASA. Of note, the patient does have chronic thrombocytopenia. Current platelet count is 79K/microliter.  -Hold statin  -Stat HCT for any neurologic decline  -Delirium precautions -BP management: Keep systolic BP less than 160. Will need to monitor BP closely and use antihypertensives with caution due to his history of orthostasis. Per chart review he has previously been on florinef.  -ST/PT/OT evaluations -Hx of Dementia on Aricept and Namenda followed by Dr. Teresa Coombs at [facility]. Had negative HCT and EEG for last month after reported shaking episodes. Aricept currently on hold due to QT interval concerns. Follow up with Dr. Teresa Coombs within 4 weeks.  - MRI brain with and without contrast.  - Stroke Team to follow in the AM.   I have seen and evaluated the patient. I have formulated the assessment and recommendations. 88 year old male with mild dementia presenting with lethargy and AMS. CT head revealed an acute 7mm IPH in the parasagittal posterior left frontal lobe. He was orthostatic in the ED and was given IVF and cooling measures with noted improvement in mental status. Neurological exam is unremarkable other than mild disorientation. Recommendations as above.   Electronically signed: Dr. Caryl Pina

## 2021-11-09 ENCOUNTER — Inpatient Hospital Stay (HOSPITAL_COMMUNITY): Payer: Medicare Other

## 2021-11-09 ENCOUNTER — Encounter (HOSPITAL_COMMUNITY): Payer: Self-pay | Admitting: Family Medicine

## 2021-11-09 DIAGNOSIS — I611 Nontraumatic intracerebral hemorrhage in hemisphere, cortical: Secondary | ICD-10-CM | POA: Diagnosis not present

## 2021-11-09 DIAGNOSIS — I6389 Other cerebral infarction: Secondary | ICD-10-CM

## 2021-11-09 DIAGNOSIS — I951 Orthostatic hypotension: Secondary | ICD-10-CM | POA: Diagnosis not present

## 2021-11-09 DIAGNOSIS — D696 Thrombocytopenia, unspecified: Secondary | ICD-10-CM | POA: Diagnosis not present

## 2021-11-09 DIAGNOSIS — I2510 Atherosclerotic heart disease of native coronary artery without angina pectoris: Secondary | ICD-10-CM | POA: Diagnosis not present

## 2021-11-09 LAB — CBC WITH DIFFERENTIAL/PLATELET
Abs Immature Granulocytes: 0.14 10*3/uL — ABNORMAL HIGH (ref 0.00–0.07)
Basophils Absolute: 0 10*3/uL (ref 0.0–0.1)
Basophils Relative: 0 %
Eosinophils Absolute: 0 10*3/uL (ref 0.0–0.5)
Eosinophils Relative: 0 %
HCT: 33 % — ABNORMAL LOW (ref 39.0–52.0)
Hemoglobin: 10.8 g/dL — ABNORMAL LOW (ref 13.0–17.0)
Immature Granulocytes: 2 %
Lymphocytes Relative: 15 %
Lymphs Abs: 1.2 10*3/uL (ref 0.7–4.0)
MCH: 32 pg (ref 26.0–34.0)
MCHC: 32.7 g/dL (ref 30.0–36.0)
MCV: 97.9 fL (ref 80.0–100.0)
Monocytes Absolute: 1.6 10*3/uL — ABNORMAL HIGH (ref 0.1–1.0)
Monocytes Relative: 22 %
Neutro Abs: 4.6 10*3/uL (ref 1.7–7.7)
Neutrophils Relative %: 61 %
Platelets: 71 10*3/uL — ABNORMAL LOW (ref 150–400)
RBC: 3.37 MIL/uL — ABNORMAL LOW (ref 4.22–5.81)
RDW: 13.9 % (ref 11.5–15.5)
Smear Review: NORMAL
WBC: 7.5 10*3/uL (ref 4.0–10.5)
nRBC: 0 % (ref 0.0–0.2)

## 2021-11-09 LAB — ECHOCARDIOGRAM COMPLETE
AR max vel: 2.14 cm2
AV Area VTI: 2.33 cm2
AV Area mean vel: 1.79 cm2
AV Mean grad: 6.7 mmHg
AV Peak grad: 11 mmHg
Ao pk vel: 1.66 m/s
Area-P 1/2: 2.87 cm2
Height: 72 in
MV VTI: 2 cm2
P 1/2 time: 371 msec
S' Lateral: 2.2 cm
Weight: 2880 oz

## 2021-11-09 LAB — PREPARE PLATELET PHERESIS
Unit division: 0
Unit division: 0

## 2021-11-09 LAB — BPAM PLATELET PHERESIS
Blood Product Expiration Date: 202306282359
Blood Product Expiration Date: 202306292359
ISSUE DATE / TIME: 202306270445
ISSUE DATE / TIME: 202306270702
Unit Type and Rh: 6200
Unit Type and Rh: 6200

## 2021-11-09 LAB — BASIC METABOLIC PANEL
Anion gap: 9 (ref 5–15)
BUN: 20 mg/dL (ref 8–23)
CO2: 21 mmol/L — ABNORMAL LOW (ref 22–32)
Calcium: 7.8 mg/dL — ABNORMAL LOW (ref 8.9–10.3)
Chloride: 109 mmol/L (ref 98–111)
Creatinine, Ser: 1.06 mg/dL (ref 0.61–1.24)
GFR, Estimated: >60 mL/min (ref >60)
Glucose, Bld: 91 mg/dL (ref 70–99)
Potassium: 3.6 mmol/L (ref 3.5–5.1)
Sodium: 139 mmol/L (ref 135–145)

## 2021-11-09 LAB — MAGNESIUM: Magnesium: 1.8 mg/dL (ref 1.7–2.4)

## 2021-11-09 MED ORDER — DONEPEZIL HCL 10 MG PO TABS: 10.0000 mg | ORAL_TABLET | Freq: Every day | ORAL | Status: DC

## 2021-11-09 MED ORDER — IOHEXOL 350 MG/ML SOLN
75.0000 mL | Freq: Once | INTRAVENOUS | Status: AC | PRN
  Administered 2021-11-09: 75 mL via INTRAVENOUS

## 2021-11-09 MED ORDER — FLUDROCORTISONE ACETATE 0.1 MG PO TABS: 0.1000 mg | ORAL_TABLET | Freq: Every day | ORAL | 0 refills | Status: DC

## 2021-11-09 NOTE — Progress Notes (Signed)
Patient given discharge instructions and stated understanding. 

## 2021-11-09 NOTE — Evaluation (Signed)
Occupational Therapy Evaluation Patient Details Name: Patrick [name] MRN: [id number] DOB: 02-06-1927 Today's Date: 11/09/2021   History of Present Illness Pt is a 88 y/o male admitted secondary to decreased responsiveness after sitting in the sun for long period. Found to have L frontal intraparenchymal hemorrhage. PMH includes dementia and CAD.   Clinical Impression   PTA patient independent with ADLs, mobility; initially reports driving but then reports spouse drives, completes med mgmt and most IADLS. Admitted for above and presents with problem list below, including impaired balance, decreased coordination, poor awareness to safety and deficits.  Noted hx of dementia at baseline.  Requires up to min guard for transfers and min assist for mobility using RW, cueing to recall use of RW and safety with RW; up to min guard for ADLS.  Educated on safety and fall prevention.  Will benefit from further OT services acutely and after dc at [facility] level to optimize safety and return to PLOF.       Recommendations for follow up therapy are one component of a multi-disciplinary discharge planning process, led by the attending physician.  Recommendations may be updated based on patient status, additional functional criteria and insurance authorization.   Follow Up Recommendations  Home health OT    Assistance Recommended at Discharge Frequent or constant Supervision/Assistance  Patient can return home with the following A little help with walking and/or transfers;A little help with bathing/dressing/bathroom;Direct supervision/assist for medications management;Direct supervision/assist for financial management;Assist for transportation;Help with stairs or ramp for entrance;Assistance with cooking/housework    Functional Status Assessment  Patient has had a recent decline in their functional status and demonstrates the ability to make significant improvements in function in a reasonable and predictable  amount of time.  Equipment Recommendations  None recommended by OT    Recommendations for Other Services       Precautions / Restrictions Precautions Precautions: Fall Restrictions Weight Bearing Restrictions: No      Mobility Bed Mobility               General bed mobility comments: In chair upon entry    Transfers Overall transfer level: Needs assistance Equipment used: Rolling walker (2 wheels) Transfers: Sit to/from Stand Sit to Stand: Min guard           General transfer comment: Min guard for safety. Increased time required      Balance Overall balance assessment: Needs assistance Sitting-balance support: No upper extremity supported, Feet supported Sitting balance-Leahy Scale: Good     Standing balance support: Bilateral upper extremity supported, During functional activity, No upper extremity supported Standing balance-Leahy Scale: Fair Standing balance comment: Able to maintain static standing without support. Poor dynamic balance                           ADL either performed or assessed with clinical judgement   ADL Overall ADL's : Needs assistance/impaired     Grooming: Min guard;Oral care;Standing           Upper Body Dressing : Set up;Sitting   Lower Body Dressing: Min guard;Sit to/from stand   Toilet Transfer: Min guard;Ambulation;Rolling walker (2 wheels)           Functional mobility during ADLs: Min guard;Rolling walker (2 wheels);Cueing for safety General ADL Comments: cueing to utilize RW, poor awareness to safety and deficits     Vision   Vision Assessment?: No apparent visual deficits     Perception  Praxis      Pertinent Vitals/Pain Pain Assessment Pain Assessment: No/denies pain     Hand Dominance Right   Extremity/Trunk Assessment Upper Extremity Assessment Upper Extremity Assessment: Generalized weakness (tremulous with finger to nose testing and FMC during grooming tasks but  functional)   Lower Extremity Assessment Lower Extremity Assessment: Defer to PT evaluation   Cervical / Trunk Assessment Cervical / Trunk Assessment: Kyphotic   Communication Communication Communication: No difficulties   Cognition Arousal/Alertness: Awake/alert Behavior During Therapy: WFL for tasks assessed/performed Overall Cognitive Status: History of cognitive impairments - at baseline                                 General Comments: Pt with dementia at baseline. Tended to repeat himself throughout session and with decreased safety awareness and awareness to deficits.     General Comments  educated on safety and fall prevention using RW    Exercises     Shoulder Instructions      Home Living Family/patient expects to be discharged to:: Private residence Living Arrangements: Spouse/significant other Available Help at Discharge: Family Type of Home: House Home Access: Stairs to enter Technical brewer of Steps: 2 Entrance Stairs-Rails: None Home Layout: Two level;Able to live on main level with bedroom/bathroom;Full bath on main level Alternate Level Stairs-Number of Steps: flight Alternate Level Stairs-Rails: Right Bathroom Shower/Tub: Occupational psychologist: Standard     Home Equipment: Grab bars - tub/shower;Shower Land (2 wheels);Cane - single point          Prior Functioning/Environment Prior Level of Function : Independent/Modified Independent;Driving                        OT Problem List: Decreased strength;Decreased activity tolerance;Impaired balance (sitting and/or standing);Decreased coordination;Decreased cognition;Decreased safety awareness;Decreased knowledge of use of DME or AE;Decreased knowledge of precautions      OT Treatment/Interventions: Self-care/ADL training;Therapeutic exercise;DME and/or AE instruction;Therapeutic activities;Neuromuscular education;Patient/family  education;Balance training;Cognitive remediation/compensation    OT Goals(Current goals can be found in the care plan section) Acute Rehab OT Goals Patient Stated Goal: home OT Goal Formulation: With patient Time For Goal Achievement: 11/23/21 Potential to Achieve Goals: Good  OT Frequency: Min 2X/week    Co-evaluation              AM-PAC OT "6 Clicks" Daily Activity     Outcome Measure Help from another person eating meals?: A Little Help from another person taking care of personal grooming?: A Little Help from another person toileting, which includes using toliet, bedpan, or urinal?: A Little Help from another person bathing (including washing, rinsing, drying)?: A Little Help from another person to put on and taking off regular upper body clothing?: A Little Help from another person to put on and taking off regular lower body clothing?: A Little 6 Click Score: 18   End of Session Equipment Utilized During Treatment: Rolling walker (2 wheels);Gait belt Nurse Communication: Mobility status  Activity Tolerance: Patient tolerated treatment well Patient left: in chair;with call bell/phone within reach;with chair alarm set  OT Visit Diagnosis: Other abnormalities of gait and mobility (R26.89);Muscle weakness (generalized) (M62.81);Other symptoms and signs involving cognitive function                Time: 7619-5093 OT Time Calculation (min): 23 min Charges:  OT General Charges $OT Visit: 1 Visit OT Evaluation $OT Eval Moderate Complexity:  1 Mod OT Treatments $Self Care/Home Management : 8-22 mins  Jolaine Artist, OT Acute Rehabilitation Services Office [phone]   Delight Stare 11/09/2021, 1:33 PM

## 2021-11-09 NOTE — Progress Notes (Addendum)
STROKE TEAM PROGRESS NOTE   SUBJECTIVE (INTERVAL HISTORY) His wife is at the bedside.  Overall his condition is gradually improving. Pt lying in bed, not in distress, denies HA or pain. Still not fully orientated but no focal deficit. Pt platelet still low and worse than baseline, could be part of the reason for ICH. CTA head and neck done showed severe stenosis of BA and PCAs but pt currently no posterior circulation symptoms.    OBJECTIVE Temp:  [97.4 F (36.3 C)-98.8 F (37.1 C)] 97.4 F (36.3 C) (06/28 0604) Pulse Rate:  [62-72] 62 (06/28 0845) Cardiac Rhythm: Heart block (06/28 0833) Resp:  [17-18] 18 (06/28 0845) BP: (129-145)/(51-57) 145/54 (06/28 0845) SpO2:  [96 %-100 %] 98 % (06/28 0845)  Recent Labs  Lab 11/07/21 1744  GLUCAP 124*   Recent Labs  Lab 11/07/21 1731 11/07/21 1801 11/08/21 0250 11/09/21 0144  NA 141  --  137 139  K 4.6  --  3.9 3.6  CL 108  --  108 109  CO2 23  --  21* 21*  GLUCOSE 125*  --  131* 91  BUN 21  --  20 20  CREATININE 1.29*  --  1.18 1.06  CALCIUM 8.9  --  8.3* 7.8*  MG  --  2.2 2.0 1.8   No results for input(s): "AST", "ALT", "ALKPHOS", "BILITOT", "PROT", "ALBUMIN" in the last 168 hours. Recent Labs  Lab 11/07/21 1731 11/08/21 0250 11/09/21 0144  WBC 4.9 14.7* 7.5  NEUTROABS 2.8  --  4.6  HGB 14.3 13.3 10.8*  HCT 43.8 39.0 33.0*  MCV 98.4 96.3 97.9  PLT 83* 79* 71*   No results for input(s): "CKTOTAL", "CKMB", "CKMBINDEX", "TROPONINI" in the last 168 hours. No results for input(s): "LABPROT", "INR" in the last 72 hours. Recent Labs    11/08/21 1304  COLORURINE YELLOW  LABSPEC 1.019  PHURINE 5.0  GLUCOSEU NEGATIVE  HGBUR SMALL*  BILIRUBINUR NEGATIVE  KETONESUR NEGATIVE  PROTEINUR NEGATIVE  NITRITE NEGATIVE  LEUKOCYTESUR NEGATIVE       Component Value Date/Time   CHOL 133 03/02/2021 1117   TRIG 92 03/02/2021 1117   HDL 43 03/02/2021 1117   CHOLHDL 3.1 03/02/2021 1117   CHOLHDL 2.2 11/15/2018 0617   VLDL 8  11/15/2018 0617   LDLCALC 72 03/02/2021 1117   No results found for: "HGBA1C" No results found for: "LABOPIA", "COCAINSCRNUR", "LABBENZ", "AMPHETMU", "THCU", "LABBARB"  No results for input(s): "ETH" in the last 168 hours.  I have personally reviewed the radiological images below and agree with the radiology interpretations.  CT ANGIO HEAD NECK W WO CM  Addendum Date: 11/09/2021   ADDENDUM REPORT: 11/09/2021 09:02 ADDENDUM: Study discussed by telephone with Dr. Rosalin Hawking on 11/09/2021 at 0853 hours. Also, we discussed that the study is negative for a CTA spot sign at the small focus of left superior frontal gyrus hemorrhage. Electronically Signed   By: Genevie Ann M.D.   On: 11/09/2021 09:02   Result Date: 11/09/2021 CLINICAL DATA:  88 year old male with altered mental status. Subcentimeter left frontal lobe hemorrhage on head CT and brain MRI yesterday. EXAM: CT ANGIOGRAPHY HEAD AND NECK TECHNIQUE: Multidetector CT imaging of the head and neck was performed using the standard protocol during bolus administration of intravenous contrast. Multiplanar CT image reconstructions and MIPs were obtained to evaluate the vascular anatomy. Carotid stenosis measurements (when applicable) are obtained utilizing NASCET criteria, using the distal internal carotid diameter as the denominator. RADIATION DOSE REDUCTION: This exam  was performed according to the departmental dose-optimization program which includes automated exposure control, adjustment of the mA and/or kV according to patient size and/or use of iterative reconstruction technique. CONTRAST:  76m OMNIPAQUE IOHEXOL 350 MG/ML SOLN COMPARISON:  Head CT and brain MRI yesterday. FINDINGS: CTA NECK Skeleton: Multilevel cervical spine degeneration and degenerative appearing ankylosis. Prior sternotomy. No acute or suspicious osseous lesion identified. Upper chest: Partially visible layering pleural effusions and generalized pulmonary septal thickening with dependent  upper lung ground-glass opacity. Visible trachea is patent. No superior mediastinal lymphadenopathy. Other neck: Diminutive or absent thyroid. No acute soft tissue finding identified in the neck. Aortic arch: Calcified aortic atherosclerosis. 3 vessel arch configuration. Right carotid system: Tortuous brachiocephalic artery with soft and calcified plaque but no stenosis. Mild plaque in the proximal right CCA with tortuosity and no stenosis. Moderate soft and calcified plaque at the right carotid bifurcation, right ICA origin and bulb. Less than 50 % stenosis with respect to the distal vessel. Left carotid system: Similar tortuosity and atherosclerosis of the left CCA without stenosis. Bulky soft and calcified plaque at the left carotid bifurcation, left ICA origin and bulb. Subsequent stenosis is up to 65 % with respect to the distal vessel at both the origin and bulb (series 7, image 219). The left ICA remains patent to the skull base. Vertebral arteries: Proximal right subclavian artery plaque and tortuosity without stenosis. Moderate to severe stenosis at the right vertebral artery origin due to soft plaque on series 7, image 299. Right vertebral remains patent to the skull base with up to moderate additional right V2 segment plaque and stenosis on series 7, image 237. Proximal left subclavian artery plaque without significant stenosis. Normal left vertebral artery origin. Tortuous left V1 segment. Mildly dominant left vertebral artery is patent to the skull base with no significant stenosis. CTA HEAD Posterior circulation: Fairly codominant vertebral V4 segments are both atherosclerotic and stenotic. Moderate to severe right distal V4 segment stenosis beyond the patent right PICA origin (series 8, image 129). Mild to moderate left V4 stenosis. Patent left PICA origin. Stenotic and diminutive basilar artery is nearly occluded in the distal basilar segment as seen on series 7, image 118 and series 12, image 24.  Right PCA origin remains patent. Left SCA is faintly enhancing. Both PCAs demonstrate severe irregularity and poor enhancement best seen on series 12. Anterior circulation: Both ICA siphons are patent with mild ectasia and up to moderate siphon atherosclerosis. But there is only mild bilateral supraclinoid ICA stenosis which is more apparent on the right. Patent carotid termini. An irregular left posterior communicating artery is patent. MCA and ACA origins are patent. Anterior communicating artery and bilateral ACA branches are patent with only mild irregularity. Right MCA M1 segment and bifurcation are patent with only mild irregularity. Right MCA branches are patent with mild to moderate irregularity. Moderate to severe stenosis Left MCA M1 segment best seen on series 11, image 22. Early bifurcation of the left MCA remains patent just beyond this lesion. No left MCA branch occlusion is identified but middle sylvian division branches are severely irregular and attenuated as seen on series 12, image 32. Venous sinuses: The major dural venous sinuses appear enhancing and patent. Anatomic variants: None significant. Review of the MIP images confirms the above findings IMPRESSION: 1. Positive for advanced atherosclerosis in the Head and Neck with Near Occlusion of the: - distal Basilar Artery. - bilateral PCAs. 2. Additional hemodynamically significant stenosis of the: - Left ICA origin and  bulb (65%). - Left MCA M1 segment (moderate to severe). - Right vertebral artery origin and bilateral distal Vertebral Arteries (moderate to severe). 3. Aortic Atherosclerosis (ICD10-I70.0). 4. Partially visible layering pleural effusions and Pulmonary Edema. Electronically Signed: By: Genevie Ann M.D. On: 11/09/2021 08:52   MR BRAIN W WO CONTRAST  Result Date: 11/08/2021 CLINICAL DATA:  Hemorrhagic stroke workup EXAM: MRI HEAD WITHOUT AND WITH CONTRAST TECHNIQUE: Multiplanar, multiecho pulse sequences of the brain and surrounding  structures were obtained without and with intravenous contrast. CONTRAST:  8.46m GADAVIST GADOBUTROL 1 MMOL/ML IV SOLN COMPARISON:  09/25/2021 FINDINGS: Brain: 6 mm acute hemorrhage in the juxtacortical high and parasagittal left frontal lobe. Minimal rim of vasogenic edema. Associated diffusion signal at the periphery is likely from susceptibility artifact. No regional infarct is noted. No evidence of underlying mass or generalized changes of amyloid angiopathy. There is enlargement of CSF spaces around the left aspect of the left cerebral hemisphere and to a lesser extent around the left aspect of the right cerebral hemisphere. No discrete and measurable collection, there are traversing vessels intermittently seen across these spaces. The pattern has been stable over multiple years. Generalized brain atrophy. Chronic small vessel ischemia with chronic perforator infarct at the right basal ganglia. Vascular: Major flow voids and vascular enhancements are preserved. Skull and upper cervical spine: Normal marrow signal Sinuses/Orbits: Bilateral cataract resection IMPRESSION: No mass or infarct seen underlying the subcentimeter left frontal hematoma. No generalized signs of amyloid angiopathy. Electronically Signed   By: JJorje GuildM.D.   On: 11/08/2021 20:56   CT HEAD WO CONTRAST (5MM)  Result Date: 11/08/2021 CLINICAL DATA:  Initial evaluation for acute mental status change. EXAM: CT HEAD WITHOUT CONTRAST TECHNIQUE: Contiguous axial images were obtained from the base of the skull through the vertex without intravenous contrast. RADIATION DOSE REDUCTION: This exam was performed according to the departmental dose-optimization program which includes automated exposure control, adjustment of the mA and/or kV according to patient size and/or use of iterative reconstruction technique. COMPARISON:  CT from 09/25/2021. FINDINGS: Brain: Diffuse prominence of the CSF containing spaces compatible with generalized  cerebral atrophy. Underlying moderately advanced chronic small vessel ischemic disease. Remote lacunar infarct present at the right basal ganglia. There is an acute parenchymal hemorrhage measuring 7 mm at the parasagittal posterior left frontal lobe (series 3, image 26). No associated mass effect. No other acute intracranial hemorrhage. No other visible acute large vessel territory infarct. No other mass lesion, mass effect or midline shift. No hydrocephalus. Asymmetric prominence of the extra-axial space overlying left cerebral hemisphere noted, stable. Vascular: No hyperdense vessel. Calcified atherosclerosis present at the skull base. Skull: Scalp soft tissues within normal limits.  Calvarium intact. Sinuses/Orbits: Globes orbital soft tissues demonstrate no acute finding. Paranasal sinuses mastoid air cells are largely clear. Other: None. IMPRESSION: 1. 7 mm acute intraparenchymal hemorrhage at the parasagittal posterior left frontal lobe. No associated mass effect. 2. No other acute intracranial abnormality. 3. Generalized cerebral atrophy with moderately advanced chronic small vessel ischemic disease. Remote lacunar infarct at the right basal ganglia. Critical Value/emergent results were called by telephone at the time of interpretation on 11/08/2021 at 1:34 am to provider TIMOTHY OPYD , who verbally acknowledged these results. Electronically Signed   By: BJeannine BogaM.D.   On: 11/08/2021 01:39   DG Chest Port 1 View  Result Date: 11/07/2021 CLINICAL DATA:  Syncope. EXAM: PORTABLE CHEST 1 VIEW COMPARISON:  Chest x-ray 09/25/2021 FINDINGS: Patient is status post cardiac surgery  as seen on the prior study. The heart size and mediastinal contours are within normal limits. Both lungs are clear. The visualized skeletal structures are unremarkable. IMPRESSION: No active disease. Electronically Signed   By: Ronney Asters M.D.   On: 11/07/2021 17:21     PHYSICAL EXAM  Temp:  [97.4 F (36.3 C)-98.8  F (37.1 C)] 97.4 F (36.3 C) (06/28 0604) Pulse Rate:  [62-72] 62 (06/28 0845) Resp:  [17-18] 18 (06/28 0845) BP: (129-145)/(51-57) 145/54 (06/28 0845) SpO2:  [96 %-100 %] 98 % (06/28 0845)  General - Well nourished, well developed, in no apparent distress.  Ophthalmologic - fundi not visualized due to noncooperation.  Cardiovascular - Regular rhythm and rate.  Mental Status -  Level of arousal and orientation to place, age and person were intact. But not orientated to time Language including expression, naming, repetition, comprehension was assessed and found intact. Slight dysarthria  Cranial Nerves II - XII - II - Visual field intact OU. III, IV, VI - Extraocular movements intact. V - Facial sensation intact bilaterally. VII - mild left nasolabial fold flattening but wife said it is normal for him. VIII - Hearing & vestibular intact bilaterally. X - Palate elevates symmetrically. Slight dysarthria XI - Chin turning & shoulder shrug intact bilaterally. XII - Tongue protrusion intact.  Motor Strength - The patient's strength was normal in all extremities and pronator drift was absent.  Bulk was normal and fasciculations were absent.   Motor Tone - Muscle tone was assessed at the neck and appendages and was normal.  Reflexes - The patient's reflexes were symmetrical in all extremities and he had no pathological reflexes.  Sensory - Light touch, temperature/pinprick were assessed and were symmetrical.    Coordination - The patient had normal movements in the hands with no ataxia or dysmetria.  Tremor was absent.  Gait and Station - deferred.   ASSESSMENT/PLAN Patrick [name] is a 88 y.o. male with history of dementia on Aricept and Namenda, CAD status post stenting, orthostatic hypotension on Florinef, CKD, chronic thrombocytopenia admitted for slurred speech, lethargy and tremulous. No tPA given due to [facility].    ICH: Small left parasagittal frontal ICH, etiology not  quite clear, could be combination of atherosclerosis, possible CAA and worsening thrombocytopenia CT left parasagittal frontal small ICH, old right BG/CR infarct, bilateral hygroma CTA head and neck severe stenosis of distal basilar artery and bilateral PCAs, additional significant stenosis of left ICA bulb, left M1 and right VA origin and bilateral V4. MRI left frontal parasagittal ICH but no significant CVA found 2D Echo EF 60 to 60% LDL pending HgbA1c pending SCDs for VTE prophylaxis aspirin 81 mg daily prior to admission, now on No antithrombotic due to [place] and severe thrombocytopenia. Ongoing aggressive stroke risk factor management Therapy recommendations: Home health Disposition: Likely home today  History of syncope 09/2021 found down in the closet with loss of consciousness.  Wife reported some shaking observed.  Likely convulsive syncope.  CT and EEG at the time negative. Not sure if related to orthostasis Pt followed up with Dr. April Manson at [facility]  Orthostatic hypotension Stable Continue home Florinef Long term BP goal normotensive  Hyperlipidemia Home meds: Zetia LDL pending, goal < 70 Continue Zetia at discharge  Thrombocytopenia Platelet 91->83->79->71, ?? hemodilution  Could be contributing factor for ICH Close monitoring If < 50, will consider platelet transfusion Recommend outpt follow up with hematomogy   Other Stroke Risk Factors Advanced age Coronary artery disease  Other  Active Problems AKI Cre 1.29->1.18->1.06  Hospital day # 1  Pt will follow up with Dr. April Manson at [facility] in about 4 weeks.    Rosalin Hawking, MD PhD Stroke Neurology 11/09/2021 11:33 AM    To contact Stroke Continuity provider, please refer to [email or web address] After hours, contact General Neurology

## 2021-11-09 NOTE — Progress Notes (Signed)
Physical Therapy Treatment Patient Details Name: Patrick [name] MRN: [id number] DOB: 1927/02/09 Today's Date: 11/09/2021   History of Present Illness Pt is a 88 y/o male admitted secondary to decreased responsiveness after sitting in the sun for long period. Found to have L frontal intraparenchymal hemorrhage. PMH includes dementia and CAD.    PT Comments    Patient continues to demonstrate decreased safety awareness and impulsivity requiring frequent cues and education on fall risk. Reinforced education on fall risk and importance of use of RW to reduce fall risk, patient denies but wife in agreement. Patient requires min guard for ambulation with use of RW due to poor safety awareness. Able to negotiate 3 stairs with R hand rail and min guard to safely access home environment. D/c plan remains appropriate.     Recommendations for follow up therapy are one component of a multi-disciplinary discharge planning process, led by the attending physician.  Recommendations may be updated based on patient status, additional functional criteria and insurance authorization.  Follow Up Recommendations  Home health PT     Assistance Recommended at Discharge Frequent or constant Supervision/Assistance  Patient can return home with the following A little help with walking and/or transfers;A little help with bathing/dressing/bathroom;Assistance with cooking/housework;Help with stairs or ramp for entrance;Assist for transportation   Equipment Recommendations  Rolling Gentle Hoge (2 wheels)    Recommendations for Other Services       Precautions / Restrictions Precautions Precautions: Fall Restrictions Weight Bearing Restrictions: No     Mobility  Bed Mobility               General bed mobility comments: sitting on EOB with wife present on arrival    Transfers Overall transfer level: Needs assistance Equipment used: Rolling Harold Mattes (2 wheels) Transfers: Sit to/from Stand Sit to Stand:  Min guard           General transfer comment: min guard for safety    Ambulation/Gait Ambulation/Gait assistance: Min guard Gait Distance (Feet): 200 Feet Assistive device: Rolling Tamara Kenyon (2 wheels) Gait Pattern/deviations: Step-through pattern, Decreased stride length, Trendelenburg Gait velocity: normal Gait velocity interpretation: >4.37 ft/sec, indicative of normal walking speed   General Gait Details: mild R knee buckling with fatigue. Min guard for safety. Impulsive throughout requiring cues for RW management and close proximity. Reinforced education about importance of use of RW for decreasing risk of falls   Stairs Stairs: Yes Stairs assistance: Min guard Stair Management: One rail Right, Alternating pattern, Forwards Number of Stairs: 3 General stair comments: min guard for safety due to impulsivity   Wheelchair Mobility    Modified Rankin (Stroke Patients Only)       Balance Overall balance assessment: Needs assistance Sitting-balance support: No upper extremity supported, Feet supported Sitting balance-Leahy Scale: Good     Standing balance support: Bilateral upper extremity supported, During functional activity, No upper extremity supported Standing balance-Leahy Scale: Fair Standing balance comment: Able to maintain static standing without support. Poor dynamic balance                            Cognition Arousal/Alertness: Awake/alert Behavior During Therapy: Impulsive, WFL for tasks assessed/performed Overall Cognitive Status: History of cognitive impairments - at baseline                                 General Comments: Pt with dementia at baseline. Impulsive and  poor safety awareness        Exercises      General Comments General comments (skin integrity, edema, etc.): reinforced education on safety and fall prevention using RW      Pertinent Vitals/Pain Pain Assessment Pain Assessment: Faces Faces Pain  Scale: Hurts little more Pain Location: R shoulder with certain movements Pain Descriptors / Indicators: Discomfort Pain Intervention(s): Monitored during session    Home Living                          Prior Function            PT Goals (current goals can now be found in the care plan section) Acute Rehab PT Goals Patient Stated Goal: to go home PT Goal Formulation: With patient Time For Goal Achievement: 11/22/21 Potential to Achieve Goals: Good Progress towards PT goals: Progressing toward goals    Frequency    Min 3X/week      PT Plan Current plan remains appropriate    Co-evaluation              AM-PAC PT "6 Clicks" Mobility   Outcome Measure  Help needed turning from your back to your side while in a flat bed without using bedrails?: A Little Help needed moving from lying on your back to sitting on the side of a flat bed without using bedrails?: A Little Help needed moving to and from a bed to a chair (including a wheelchair)?: A Little Help needed standing up from a chair using your arms (e.g., wheelchair or bedside chair)?: A Little Help needed to walk in hospital room?: A Little Help needed climbing 3-5 steps with a railing? : A Little 6 Click Score: 18    End of Session Equipment Utilized During Treatment: Gait belt Activity Tolerance: Patient tolerated treatment well Patient left: in bed;with call bell/phone within reach;with family/visitor present Nurse Communication: Mobility status PT Visit Diagnosis: Unsteadiness on feet (R26.81);Muscle weakness (generalized) (M62.81);Difficulty in walking, not elsewhere classified (R26.2)     Time: 8333-8329 PT Time Calculation (min) (ACUTE ONLY): 16 min  Charges:  $Gait Training: 8-22 mins                     Gearldene Fiorenza A. Gilford Rile PT, DPT Acute Rehabilitation Services Office [phone]    Linna Hoff 11/09/2021, 4:10 PM

## 2021-11-10 LAB — URINE CULTURE: Culture: <10000 — AB

## 2021-11-11 DIAGNOSIS — N400 Enlarged prostate without lower urinary tract symptoms: Secondary | ICD-10-CM | POA: Diagnosis not present

## 2021-11-11 DIAGNOSIS — R413 Other amnesia: Secondary | ICD-10-CM | POA: Diagnosis not present

## 2021-11-11 DIAGNOSIS — I2510 Atherosclerotic heart disease of native coronary artery without angina pectoris: Secondary | ICD-10-CM | POA: Diagnosis not present

## 2021-11-11 DIAGNOSIS — K219 Gastro-esophageal reflux disease without esophagitis: Secondary | ICD-10-CM | POA: Diagnosis not present

## 2021-11-11 DIAGNOSIS — K5790 Diverticulosis of intestine, part unspecified, without perforation or abscess without bleeding: Secondary | ICD-10-CM | POA: Diagnosis not present

## 2021-11-11 DIAGNOSIS — I160 Hypertensive urgency: Secondary | ICD-10-CM | POA: Diagnosis not present

## 2021-11-11 DIAGNOSIS — Z8619 Personal history of other infectious and parasitic diseases: Secondary | ICD-10-CM | POA: Diagnosis not present

## 2021-11-11 DIAGNOSIS — F0390 Unspecified dementia without behavioral disturbance: Secondary | ICD-10-CM | POA: Diagnosis not present

## 2021-11-11 DIAGNOSIS — N529 Male erectile dysfunction, unspecified: Secondary | ICD-10-CM | POA: Diagnosis not present

## 2021-11-11 DIAGNOSIS — N1832 Chronic kidney disease, stage 3b: Secondary | ICD-10-CM | POA: Diagnosis not present

## 2021-11-11 DIAGNOSIS — Z85828 Personal history of other malignant neoplasm of skin: Secondary | ICD-10-CM | POA: Diagnosis not present

## 2021-11-11 DIAGNOSIS — I129 Hypertensive chronic kidney disease with stage 1 through stage 4 chronic kidney disease, or unspecified chronic kidney disease: Secondary | ICD-10-CM | POA: Diagnosis not present

## 2021-11-11 DIAGNOSIS — G250 Essential tremor: Secondary | ICD-10-CM | POA: Diagnosis not present

## 2021-11-11 DIAGNOSIS — I252 Old myocardial infarction: Secondary | ICD-10-CM | POA: Diagnosis not present

## 2021-11-11 DIAGNOSIS — G8929 Other chronic pain: Secondary | ICD-10-CM | POA: Diagnosis not present

## 2021-11-11 DIAGNOSIS — G9340 Encephalopathy, unspecified: Secondary | ICD-10-CM | POA: Diagnosis not present

## 2021-11-11 DIAGNOSIS — E876 Hypokalemia: Secondary | ICD-10-CM | POA: Diagnosis not present

## 2021-11-11 DIAGNOSIS — M21372 Foot drop, left foot: Secondary | ICD-10-CM | POA: Diagnosis not present

## 2021-11-11 DIAGNOSIS — T18128D Food in esophagus causing other injury, subsequent encounter: Secondary | ICD-10-CM | POA: Diagnosis not present

## 2021-11-11 DIAGNOSIS — M21371 Foot drop, right foot: Secondary | ICD-10-CM | POA: Diagnosis not present

## 2021-11-11 DIAGNOSIS — E785 Hyperlipidemia, unspecified: Secondary | ICD-10-CM | POA: Diagnosis not present

## 2021-11-11 DIAGNOSIS — N179 Acute kidney failure, unspecified: Secondary | ICD-10-CM | POA: Diagnosis not present

## 2021-11-11 DIAGNOSIS — M109 Gout, unspecified: Secondary | ICD-10-CM | POA: Diagnosis not present

## 2021-11-11 DIAGNOSIS — D696 Thrombocytopenia, unspecified: Secondary | ICD-10-CM | POA: Diagnosis not present

## 2021-11-11 DIAGNOSIS — E039 Hypothyroidism, unspecified: Secondary | ICD-10-CM | POA: Diagnosis not present

## 2021-11-11 NOTE — Discharge Summary (Signed)
Physician Discharge Summary   Patient: Patrick [name] MRN: [id number] DOB: 12-Sep-1926  Admit date:     11/07/2021  Discharge date: 11/09/2021  Discharge Physician: Berle Mull  PCP: Ginger Organ., MD  Recommendations at discharge: Follow-up with PCP in 1 week. Follow-up with neurology as recommended. Follow-up with hematology as recommended.  Discharge Diagnoses: Principal Problem:   Cerebral parenchymal hemorrhage ([name]) Active Problems:   Coronary artery disease involving native coronary artery of native heart without angina pectoris   Thrombocytopenia ([name])   Memory disorder   Stage 3a chronic kidney disease (CKD) ([name])   Hypertensive urgency   Chronic pain   Prolonged QT interval   Transient LOC (loss of consciousness)   Orthostatic hypotension  Assessment and Plan: 88 year old male presented with complaints of slurred speech and lethargy.  Work-up was positive for small ICH.  MRI negative for acute stroke but patient did has significant severe stenosis in the posterior circulation. Neurology was consulted. Patient not a good candidate for any antiplatelet medication given his ICH right now.  Patient will follow-up with neurology outpatient in the clinic. Echocardiogram was performed which was also reassuring.  ICH. Small left parasagittal frontal ICH. In the setting of thrombocytopenia. CTA shows evidence of severe stenosis of distal basilar artery and bilateral PCA. MRI negative for any acute CVA. Echocardiogram shows preserved EF. Patient was on 81 mg aspirin but currently on hold due to thrombocytopenia as well as ICH. PT OT recommends home health.  Outpatient follow-up with neurology recommended.  Chronic thrombocytopenia. Platelet has chronically low platelet. Currently in 70s most likely due to hemodilution as the patient has received 2.5 L of fluid since admission. Recommend outpatient follow-up with hematology.  Orthostatic hypotension  but Chronic. Was supposed to be on Florinef which will be resumed. Monitor.  Hyperlipidemia. On Zetia. Continue.  Acute metabolic encephalopathy versus delirium in the setting of dementia. Mentation appears to be improving and close to baseline. Monitor.  CAD. Aspirin currently on hold.  Hypothyroidism. Continue Synthroid.  Elevated blood pressure. Currently treated with medication. Monitor.  Prolonged QTc. Electrolytes replaced.  BPH. Continue Proscar.  GERD. Continue PPI.  Consultants: Neurology Procedures performed:  Echocardiogram DISCHARGE MEDICATION: Allergies as of 11/09/2021       Reactions   Other Other (See Comments)   Pravachol    Unknown   Triazolam Other (See Comments)   "Makes me crazy"        Medication List     STOP taking these medications    aspirin 81 MG tablet       TAKE these medications    allopurinol 100 MG tablet Commonly known as: ZYLOPRIM Take 100 mg by mouth daily.   diphenhydramine-acetaminophen 25-500 MG Tabs tablet Commonly known as: TYLENOL PM Take 1 tablet by mouth at bedtime as needed (sleep).   divalproex 250 MG DR tablet Commonly known as: DEPAKOTE Take 250 mg by mouth 2 (two) times daily.   donepezil 10 MG tablet Commonly known as: ARICEPT Take 10 mg by mouth at bedtime.   DULoxetine 30 MG capsule Commonly known as: CYMBALTA Take 30 mg by mouth daily.   ezetimibe 10 MG tablet Commonly known as: ZETIA TAKE 1 TABLET BY MOUTH EVERY DAY   finasteride 5 MG tablet Commonly known as: PROSCAR Take 5 mg by mouth daily.   fludrocortisone 0.1 MG tablet Commonly known as: FLORINEF Take 1 tablet (0.1 mg total) by mouth daily.   levothyroxine 112 MCG tablet Commonly known as: SYNTHROID Take  112 mcg by mouth daily.   lidocaine 5 % Commonly known as: LIDODERM Place 1 patch onto the skin daily.   memantine 10 MG tablet Commonly known as: NAMENDA Take 1 tablet (10 mg total) by mouth 2 (two) times  daily. What changed: when to take this   mirtazapine 15 MG tablet Commonly known as: REMERON Take 15 mg by mouth at bedtime.   nitroGLYCERIN 0.4 MG SL tablet Commonly known as: NITROSTAT Place 1 tablet (0.4 mg total) under the tongue every 5 (five) minutes as needed. What changed: reasons to take this   oxyCODONE-acetaminophen 10-325 MG tablet Commonly known as: PERCOCET Take 1 tablet by mouth 3 (three) times daily as needed for pain.   pantoprazole 40 MG tablet Commonly known as: Protonix Take 1 tablet (40 mg total) by mouth daily before breakfast.   pregabalin 75 MG capsule Commonly known as: LYRICA Take 75 mg by mouth 2 (two) times daily.   sertraline 100 MG tablet Commonly known as: ZOLOFT Take 100 mg by mouth daily.   SYSTANE OP Place 1 drop into both eyes 2 (two) times daily as needed (dry eyes).   tamsulosin 0.4 MG Caps capsule Commonly known as: FLOMAX Take 0.4 mg by mouth at bedtime as needed (when finasteride does not work).   Vitamin D (Ergocalciferol) 1.25 MG (50000 UNIT) Caps capsule Commonly known as: DRISDOL Take 50,000 Units by mouth every Monday.        Follow-up [facility].   Why: home health PT Contact information: [address] [address] [phone]         Ginger Organ., MD. Schedule an appointment as soon as possible for a visit in 1 week(s).   Specialty: Internal Medicine Contact information: [place] [address] [phone]         Alric Ran, MD. Schedule an appointment as soon as possible for a visit in 1 month(s).   Specialty: Neurology Contact information: [address] [place] [address] [phone]                Disposition: Home Diet recommendation: Cardiac diet  Discharge Exam: Vitals:   11/08/21 2030 11/09/21 0604 11/09/21 0845 11/09/21 1556  BP: (!) 138/52 (!) 145/57 (!) 145/54 (!) 169/60   Pulse: 70 64 62 65  Resp: '17 18 18 18  '$ Temp: 98.8 F (37.1 C) (!) 97.4 F (36.3 C)  97.7 F (36.5 C)  TempSrc: Oral Oral  Oral  SpO2: 100% 96% 98% 98%  Weight:      Height:       General: Appear in no distress; no visible Abnormal Neck Mass Or lumps, Conjunctiva normal Cardiovascular: S1 and S2 Present, no Murmur, Respiratory: good respiratory effort, Bilateral Air entry present and CTA, no Crackles, no wheezes Abdomen: Bowel Sound present, Non tender  Extremities: no Pedal edema Neurology: alert and oriented to time, place, and person  Gait not checked due to patient safety concerns Filed Weights   11/07/21 1652  Weight: 81.6 kg   Condition at discharge: stable  The results of significant diagnostics from this hospitalization (including imaging, microbiology, ancillary and laboratory) are listed below for reference.   Imaging Studies: ECHOCARDIOGRAM COMPLETE  Result Date: 11/09/2021    ECHOCARDIOGRAM REPORT   Patient Name:   Patrick Rica H Quiles Date of Exam: 11/09/2021 Medical Rec #:  [id number]        Height:  72.0 in Accession #:    [id number]       Weight:       180.0 lb Date of Birth:  03-Jun-1926       BSA:          2.037 m Patient Age:    88 years         BP:           145/54 mmHg Patient Gender: M                HR:           62 bpm. Exam Location:  Inpatient Procedure: 2D Echo, Cardiac Doppler and Color Doppler Indications:    Stroke I63.9  History:        Patient has prior history of Echocardiogram examinations, most                 recent 04/06/2021. CAD, Prior CABG; Risk Factors:Hypertension                 and Dyslipidemia. Thyroid disease. Chronic kidney disease. GERD.  Sonographer:    Darlina Sicilian RDCS Referring Phys: [id number] [place]  1. Left ventricular ejection fraction, by estimation, is 60 to 65%. The left ventricle has normal function. The left ventricle has no regional wall motion abnormalities. There is mild left ventricular hypertrophy. Left  ventricular diastolic parameters are consistent with Grade I diastolic dysfunction (impaired relaxation). Elevated left atrial pressure.  2. Right ventricular systolic function is normal. The right ventricular size is normal.  3. Left atrial size was mildly dilated.  4. Right atrial size was mildly dilated.  5. The mitral valve is normal in structure. Mild mitral valve regurgitation. No evidence of mitral stenosis. Moderate mitral annular calcification.  6. The aortic valve is calcified. There is mild calcification of the aortic valve. There is mild thickening of the aortic valve. Aortic valve regurgitation is moderate. Aortic valve sclerosis/calcification is present, without any evidence of aortic stenosis. Aortic valve mean gradient measures 6.7 mmHg. Aortic valve Vmax measures 1.66 m/s.  7. The inferior vena cava is normal in size with greater than 50% respiratory variability, suggesting right atrial pressure of 3 mmHg. Comparison(s): No significant change from prior study. Prior images reviewed side by side. Conclusion(s)/Recommendation(s): No intracardiac source of embolism detected on this transthoracic study. Consider a transesophageal echocardiogram to exclude cardiac source of embolism if clinically indicated. FINDINGS  Left Ventricle: Left ventricular ejection fraction, by estimation, is 60 to 65%. The left ventricle has normal function. The left ventricle has no regional wall motion abnormalities. The left ventricular internal cavity size was normal in size. There is  mild left ventricular hypertrophy. Left ventricular diastolic parameters are consistent with Grade I diastolic dysfunction (impaired relaxation). Elevated left atrial pressure. Right Ventricle: The right ventricular size is normal. No increase in right ventricular wall thickness. Right ventricular systolic function is normal. Left Atrium: Left atrial size was mildly dilated. Right Atrium: Right atrial size was mildly dilated. Pericardium:  There is no evidence of pericardial effusion. Mitral Valve: The mitral valve is normal in structure. Moderate mitral annular calcification. Mild mitral valve regurgitation. No evidence of mitral valve stenosis. MV peak gradient, 6.2 mmHg. The mean mitral valve gradient is 3.0 mmHg. Tricuspid Valve: The tricuspid valve is normal in structure. Tricuspid valve regurgitation is not demonstrated. No evidence of tricuspid stenosis. Aortic Valve: The aortic valve is calcified. There is mild calcification of the aortic valve. There is mild thickening  of the aortic valve. Aortic valve regurgitation is moderate. Aortic regurgitation PHT measures 371 msec. Aortic valve sclerosis/calcification is present, without any evidence of aortic stenosis. Aortic valve mean gradient measures 6.7 mmHg. Aortic valve peak gradient measures 11.0 mmHg. Aortic valve area, by VTI measures 2.33 cm. Pulmonic Valve: The pulmonic valve was normal in structure. Pulmonic valve regurgitation is not visualized. No evidence of pulmonic stenosis. Aorta: The aortic root is normal in size and structure. Venous: The inferior vena cava is normal in size with greater than 50% respiratory variability, suggesting right atrial pressure of 3 mmHg. IAS/Shunts: No atrial level shunt detected by color flow Doppler.  LEFT VENTRICLE PLAX 2D LVIDd:         4.10 cm   Diastology LVIDs:         2.20 cm   LV e' medial:    4.57 cm/s LV PW:         1.20 cm   LV E/e' medial:  28.0 LV IVS:        1.20 cm   LV e' lateral:   5.66 cm/s LVOT diam:     2.40 cm   LV E/e' lateral: 22.6 LV SV:         86 LV SV Index:   42 LVOT Area:     4.52 cm  RIGHT VENTRICLE RV S prime:     8.85 cm/s TAPSE (M-mode): 1.7 cm LEFT ATRIUM             Index        RIGHT ATRIUM           Index LA diam:        4.30 cm 2.11 cm/m   RA Area:     12.90 cm LA Vol (A2C):   59.6 ml 29.28 ml/m  RA Volume:   24.50 ml  12.03 ml/m LA Vol (A4C):   81.9 ml 40.20 ml/m LA Biplane Vol: 76.7 ml 37.65 ml/m  AORTIC  VALVE AV Area (Vmax):    2.14 cm AV Area (Vmean):   1.79 cm AV Area (VTI):     2.33 cm AV Vmax:           165.67 cm/s AV Vmean:          119.000 cm/s AV VTI:            0.367 m AV Peak Grad:      11.0 mmHg AV Mean Grad:      6.7 mmHg LVOT Vmax:         78.30 cm/s LVOT Vmean:        47.100 cm/s LVOT VTI:          0.189 m LVOT/AV VTI ratio: 0.51 AI PHT:            371 msec  AORTA Ao Root diam: 3.90 cm Ao Asc diam:  3.40 cm MITRAL VALVE MV Area (PHT): 2.87 cm     SHUNTS MV Area VTI:   2.00 cm     Systemic VTI:  0.19 m MV Peak grad:  6.2 mmHg     Systemic Diam: 2.40 cm MV Mean grad:  3.0 mmHg MV Vmax:       1.25 m/s MV Vmean:      74.7 cm/s MV Decel Time: 264 msec MV E velocity: 128.00 cm/s MV A velocity: 136.00 cm/s MV E/A ratio:  0.94 Candee Furbish MD Electronically signed by Candee Furbish MD Signature Date/Time: 11/09/2021/4:06:01 PM    Final  CT ANGIO HEAD NECK W WO CM  Addendum Date: 11/09/2021   ADDENDUM REPORT: 11/09/2021 09:02 ADDENDUM: Study discussed by telephone with Dr. Rosalin Hawking on 11/09/2021 at 0853 hours. Also, we discussed that the study is negative for a CTA spot sign at the small focus of left superior frontal gyrus hemorrhage. Electronically Signed   By: Genevie Ann M.D.   On: 11/09/2021 09:02   Result Date: 11/09/2021 CLINICAL DATA:  88 year old male with altered mental status. Subcentimeter left frontal lobe hemorrhage on head CT and brain MRI yesterday. EXAM: CT ANGIOGRAPHY HEAD AND NECK TECHNIQUE: Multidetector CT imaging of the head and neck was performed using the standard protocol during bolus administration of intravenous contrast. Multiplanar CT image reconstructions and MIPs were obtained to evaluate the vascular anatomy. Carotid stenosis measurements (when applicable) are obtained utilizing NASCET criteria, using the distal internal carotid diameter as the denominator. RADIATION DOSE REDUCTION: This exam was performed according to the departmental dose-optimization program which  includes automated exposure control, adjustment of the mA and/or kV according to patient size and/or use of iterative reconstruction technique. CONTRAST:  69m OMNIPAQUE IOHEXOL 350 MG/ML SOLN COMPARISON:  Head CT and brain MRI yesterday. FINDINGS: CTA NECK Skeleton: Multilevel cervical spine degeneration and degenerative appearing ankylosis. Prior sternotomy. No acute or suspicious osseous lesion identified. Upper chest: Partially visible layering pleural effusions and generalized pulmonary septal thickening with dependent upper lung ground-glass opacity. Visible trachea is patent. No superior mediastinal lymphadenopathy. Other neck: Diminutive or absent thyroid. No acute soft tissue finding identified in the neck. Aortic arch: Calcified aortic atherosclerosis. 3 vessel arch configuration. Right carotid system: Tortuous brachiocephalic artery with soft and calcified plaque but no stenosis. Mild plaque in the proximal right CCA with tortuosity and no stenosis. Moderate soft and calcified plaque at the right carotid bifurcation, right ICA origin and bulb. Less than 50 % stenosis with respect to the distal vessel. Left carotid system: Similar tortuosity and atherosclerosis of the left CCA without stenosis. Bulky soft and calcified plaque at the left carotid bifurcation, left ICA origin and bulb. Subsequent stenosis is up to 65 % with respect to the distal vessel at both the origin and bulb (series 7, image 219). The left ICA remains patent to the skull base. Vertebral arteries: Proximal right subclavian artery plaque and tortuosity without stenosis. Moderate to severe stenosis at the right vertebral artery origin due to soft plaque on series 7, image 299. Right vertebral remains patent to the skull base with up to moderate additional right V2 segment plaque and stenosis on series 7, image 237. Proximal left subclavian artery plaque without significant stenosis. Normal left vertebral artery origin. Tortuous left V1  segment. Mildly dominant left vertebral artery is patent to the skull base with no significant stenosis. CTA HEAD Posterior circulation: Fairly codominant vertebral V4 segments are both atherosclerotic and stenotic. Moderate to severe right distal V4 segment stenosis beyond the patent right PICA origin (series 8, image 129). Mild to moderate left V4 stenosis. Patent left PICA origin. Stenotic and diminutive basilar artery is nearly occluded in the distal basilar segment as seen on series 7, image 118 and series 12, image 24. Right PCA origin remains patent. Left SCA is faintly enhancing. Both PCAs demonstrate severe irregularity and poor enhancement best seen on series 12. Anterior circulation: Both ICA siphons are patent with mild ectasia and up to moderate siphon atherosclerosis. But there is only mild bilateral supraclinoid ICA stenosis which is more apparent on the right. Patent carotid termini. An  irregular left posterior communicating artery is patent. MCA and ACA origins are patent. Anterior communicating artery and bilateral ACA branches are patent with only mild irregularity. Right MCA M1 segment and bifurcation are patent with only mild irregularity. Right MCA branches are patent with mild to moderate irregularity. Moderate to severe stenosis Left MCA M1 segment best seen on series 11, image 22. Early bifurcation of the left MCA remains patent just beyond this lesion. No left MCA branch occlusion is identified but middle sylvian division branches are severely irregular and attenuated as seen on series 12, image 32. Venous sinuses: The major dural venous sinuses appear enhancing and patent. Anatomic variants: None significant. Review of the MIP images confirms the above findings IMPRESSION: 1. Positive for advanced atherosclerosis in the Head and Neck with Near Occlusion of the: - distal Basilar Artery. - bilateral PCAs. 2. Additional hemodynamically significant stenosis of the: - Left ICA origin and bulb  (65%). - Left MCA M1 segment (moderate to severe). - Right vertebral artery origin and bilateral distal Vertebral Arteries (moderate to severe). 3. Aortic Atherosclerosis (ICD10-I70.0). 4. Partially visible layering pleural effusions and Pulmonary Edema. Electronically Signed: By: Genevie Ann M.D. On: 11/09/2021 08:52   MR BRAIN W WO CONTRAST  Result Date: 11/08/2021 CLINICAL DATA:  Hemorrhagic stroke workup EXAM: MRI HEAD WITHOUT AND WITH CONTRAST TECHNIQUE: Multiplanar, multiecho pulse sequences of the brain and surrounding structures were obtained without and with intravenous contrast. CONTRAST:  8.5m GADAVIST GADOBUTROL 1 MMOL/ML IV SOLN COMPARISON:  09/25/2021 FINDINGS: Brain: 6 mm acute hemorrhage in the juxtacortical high and parasagittal left frontal lobe. Minimal rim of vasogenic edema. Associated diffusion signal at the periphery is likely from susceptibility artifact. No regional infarct is noted. No evidence of underlying mass or generalized changes of amyloid angiopathy. There is enlargement of CSF spaces around the left aspect of the left cerebral hemisphere and to a lesser extent around the left aspect of the right cerebral hemisphere. No discrete and measurable collection, there are traversing vessels intermittently seen across these spaces. The pattern has been stable over multiple years. Generalized brain atrophy. Chronic small vessel ischemia with chronic perforator infarct at the right basal ganglia. Vascular: Major flow voids and vascular enhancements are preserved. Skull and upper cervical spine: Normal marrow signal Sinuses/Orbits: Bilateral cataract resection IMPRESSION: No mass or infarct seen underlying the subcentimeter left frontal hematoma. No generalized signs of amyloid angiopathy. Electronically Signed   By: JJorje GuildM.D.   On: 11/08/2021 20:56   CT HEAD WO CONTRAST (5MM)  Result Date: 11/08/2021 CLINICAL DATA:  Initial evaluation for acute mental status change. EXAM: CT  HEAD WITHOUT CONTRAST TECHNIQUE: Contiguous axial images were obtained from the base of the skull through the vertex without intravenous contrast. RADIATION DOSE REDUCTION: This exam was performed according to the departmental dose-optimization program which includes automated exposure control, adjustment of the mA and/or kV according to patient size and/or use of iterative reconstruction technique. COMPARISON:  CT from 09/25/2021. FINDINGS: Brain: Diffuse prominence of the CSF containing spaces compatible with generalized cerebral atrophy. Underlying moderately advanced chronic small vessel ischemic disease. Remote lacunar infarct present at the right basal ganglia. There is an acute parenchymal hemorrhage measuring 7 mm at the parasagittal posterior left frontal lobe (series 3, image 26). No associated mass effect. No other acute intracranial hemorrhage. No other visible acute large vessel territory infarct. No other mass lesion, mass effect or midline shift. No hydrocephalus. Asymmetric prominence of the extra-axial space overlying left cerebral hemisphere noted,  stable. Vascular: No hyperdense vessel. Calcified atherosclerosis present at the skull base. Skull: Scalp soft tissues within normal limits.  Calvarium intact. Sinuses/Orbits: Globes orbital soft tissues demonstrate no acute finding. Paranasal sinuses mastoid air cells are largely clear. Other: None. IMPRESSION: 1. 7 mm acute intraparenchymal hemorrhage at the parasagittal posterior left frontal lobe. No associated mass effect. 2. No other acute intracranial abnormality. 3. Generalized cerebral atrophy with moderately advanced chronic small vessel ischemic disease. Remote lacunar infarct at the right basal ganglia. Critical Value/emergent results were called by telephone at the time of interpretation on 11/08/2021 at 1:34 am to provider TIMOTHY OPYD , who verbally acknowledged these results. Electronically Signed   By: Jeannine Boga M.D.   On:  11/08/2021 01:39   DG Chest Port 1 View  Result Date: 11/07/2021 CLINICAL DATA:  Syncope. EXAM: PORTABLE CHEST 1 VIEW COMPARISON:  Chest x-ray 09/25/2021 FINDINGS: Patient is status post cardiac surgery as seen on the prior study. The heart size and mediastinal contours are within normal limits. Both lungs are clear. The visualized skeletal structures are unremarkable. IMPRESSION: No active disease. Electronically Signed   By: Ronney Asters M.D.   On: 11/07/2021 17:21    Microbiology: Results for orders placed or performed during the hospital encounter of 11/07/21  Urine Culture     Status: Abnormal   Collection Time: 11/08/21  4:47 PM   Specimen: Urine, Catheterized  Result Value Ref Range Status   Specimen Description URINE, CATHETERIZED  Final   Special Requests NONE  Final   Culture (A)  Final    <10,000 COLONIES/mL INSIGNIFICANT GROWTH Performed at [facility], [address], [place], [place] [address]    Report Status 11/10/2021 FINAL  Final    Labs: CBC: Recent Labs  Lab 11/07/21 1731 11/08/21 0250 11/09/21 0144  WBC 4.9 14.7* 7.5  NEUTROABS 2.8  --  4.6  HGB 14.3 13.3 10.8*  HCT 43.8 39.0 33.0*  MCV 98.4 96.3 97.9  PLT 83* 79* 71*   Basic Metabolic Panel: Recent Labs  Lab 11/07/21 1731 11/07/21 1801 11/08/21 0250 11/09/21 0144  NA 141  --  137 139  K 4.6  --  3.9 3.6  CL 108  --  108 109  CO2 23  --  21* 21*  GLUCOSE 125*  --  131* 91  BUN 21  --  20 20  CREATININE 1.29*  --  1.18 1.06  CALCIUM 8.9  --  8.3* 7.8*  MG  --  2.2 2.0 1.8   Liver Function Tests: No results for input(s): "AST", "ALT", "ALKPHOS", "BILITOT", "PROT", "ALBUMIN" in the last 168 hours. CBG: Recent Labs  Lab 11/07/21 1744  GLUCAP 124*    Discharge time spent: greater than 30 minutes.  Signed: Berle Mull, MD [facility] 11/09/2021

## 2021-11-16 DIAGNOSIS — I651 Occlusion and stenosis of basilar artery: Secondary | ICD-10-CM | POA: Diagnosis not present

## 2021-11-16 DIAGNOSIS — B001 Herpesviral vesicular dermatitis: Secondary | ICD-10-CM | POA: Diagnosis not present

## 2021-11-16 DIAGNOSIS — E538 Deficiency of other specified B group vitamins: Secondary | ICD-10-CM | POA: Diagnosis not present

## 2021-11-16 DIAGNOSIS — I10 Essential (primary) hypertension: Secondary | ICD-10-CM | POA: Diagnosis not present

## 2021-11-16 DIAGNOSIS — I629 Nontraumatic intracranial hemorrhage, unspecified: Secondary | ICD-10-CM | POA: Diagnosis not present

## 2021-11-16 DIAGNOSIS — F0280 Dementia in other diseases classified elsewhere without behavioral disturbance: Secondary | ICD-10-CM | POA: Diagnosis not present

## 2021-11-16 DIAGNOSIS — D696 Thrombocytopenia, unspecified: Secondary | ICD-10-CM | POA: Diagnosis not present

## 2021-11-16 DIAGNOSIS — I2510 Atherosclerotic heart disease of native coronary artery without angina pectoris: Secondary | ICD-10-CM | POA: Diagnosis not present

## 2021-11-16 DIAGNOSIS — R269 Unspecified abnormalities of gait and mobility: Secondary | ICD-10-CM | POA: Diagnosis not present

## 2021-11-18 DIAGNOSIS — G9340 Encephalopathy, unspecified: Secondary | ICD-10-CM | POA: Diagnosis not present

## 2021-11-18 DIAGNOSIS — E039 Hypothyroidism, unspecified: Secondary | ICD-10-CM | POA: Diagnosis not present

## 2021-11-18 DIAGNOSIS — I129 Hypertensive chronic kidney disease with stage 1 through stage 4 chronic kidney disease, or unspecified chronic kidney disease: Secondary | ICD-10-CM | POA: Diagnosis not present

## 2021-11-18 DIAGNOSIS — F0390 Unspecified dementia without behavioral disturbance: Secondary | ICD-10-CM | POA: Diagnosis not present

## 2021-11-18 DIAGNOSIS — I2510 Atherosclerotic heart disease of native coronary artery without angina pectoris: Secondary | ICD-10-CM | POA: Diagnosis not present

## 2021-11-18 DIAGNOSIS — N1832 Chronic kidney disease, stage 3b: Secondary | ICD-10-CM | POA: Diagnosis not present

## 2021-11-21 DIAGNOSIS — M7918 Myalgia, other site: Secondary | ICD-10-CM | POA: Diagnosis not present

## 2021-11-21 DIAGNOSIS — M48062 Spinal stenosis, lumbar region with neurogenic claudication: Secondary | ICD-10-CM | POA: Diagnosis not present

## 2021-11-21 DIAGNOSIS — M4316 Spondylolisthesis, lumbar region: Secondary | ICD-10-CM | POA: Diagnosis not present

## 2021-11-21 DIAGNOSIS — M47816 Spondylosis without myelopathy or radiculopathy, lumbar region: Secondary | ICD-10-CM | POA: Diagnosis not present

## 2021-11-24 ENCOUNTER — Ambulatory Visit (INDEPENDENT_AMBULATORY_CARE_PROVIDER_SITE_OTHER): Payer: Medicare Other | Admitting: Neurology

## 2021-11-24 ENCOUNTER — Encounter: Payer: Self-pay | Admitting: Neurology

## 2021-11-24 VITALS — BP 157/69 | HR 63 | Ht 72.0 in | Wt 152.5 lb

## 2021-11-24 DIAGNOSIS — E538 Deficiency of other specified B group vitamins: Secondary | ICD-10-CM

## 2021-11-24 DIAGNOSIS — F0390 Unspecified dementia without behavioral disturbance: Secondary | ICD-10-CM | POA: Diagnosis not present

## 2021-11-24 DIAGNOSIS — G609 Hereditary and idiopathic neuropathy, unspecified: Secondary | ICD-10-CM | POA: Diagnosis not present

## 2021-11-24 DIAGNOSIS — R269 Unspecified abnormalities of gait and mobility: Secondary | ICD-10-CM

## 2021-11-24 MED ORDER — PREGABALIN 75 MG PO CAPS
ORAL_CAPSULE | ORAL | 6 refills | Status: DC
Start: 2021-11-24 — End: 2021-12-09

## 2021-11-24 NOTE — Progress Notes (Signed)
[facility]  PATIENT: [place] H Woodstock DOB: 14-Sep-1926  REQUESTING CLINICIAN: Rosalin Hawking, MD HISTORY FROM: Wife  REASON FOR VISIT: Seizure like activity    HISTORICAL  CHIEF COMPLAINT:  Chief Complaint  Patient presents with   Hospitalization Follow-up    Rm 15. Accompanied by wife, [name]. Hospital f/u/Saw Dr. Erlinda Hong, f/u with Dr April Manson.    INTERVAL HISTORY 11/24/2021:  Patient presents today for follow-up, he is accompanied by his wife [name].  At last visit plan was to obtain a routine EEG which was completed and negative.  At last visit I recommended him to use a walker but patient has not been using his walker.  Wife reported at home they have 3 walkers.  He was seen in the ED on June 26 for a episode of hyperthermia.  He was sun bathing, was in the sun for more than 3 hours and wife did have problem waking him up.  EMS was called.  Patient was febrile to 103.  He was given IV fluid and put on cooling blanket with improvement of his mental status.  During his hospitalization he was also noted to have thrombocytopenia, and prolonged QT therefore both held aspirin and Aricept held. Since being home, he is back to his normal self, stiff reluctant using the walker. He was also noted to have a small left frontaL lobe bleed likely due to his thrombocytopenia.     HISTORY OF PRESENT ILLNESS:  This is a 88 year old gentleman past medical history of dementia, peripheral neuropathy, hypothyroidism, who is presenting after seizure-like activity.  Wife reports on May 14 she found patient on the closet laying down initially unresponsive then later started shaking and stating that she is cold.  She is unsure how long the patient has been down.  He was confused and could not get up and he was slurring his words. He also had urinary incontinence. She took him to the hospital, initial head CT was negative for any acute abnormality, he was back to his baseline and discharge home  with urgent neuro follow-up.  He is already on Depakote 500 mg for mood reason, denies any previous history of seizures and no family history of seizures.  He does have peripheral neuropathy, he is on duloxetine and Lyrica,  wife has been reporting falls due to neuropathy.  Last year he was referred to physical therapy but could not complete due to episode of hypotension likely orthostatic hypotension.    OTHER MEDICAL CONDITIONS: Dementia, peripheral neuropathy, hypothyroidism   REVIEW OF SYSTEMS: Full 14 system review of systems performed and negative with exception of: as noted in the HPI   ALLERGIES: Allergies  Allergen Reactions   Other Other (See Comments)   Pravachol     Unknown   Triazolam Other (See Comments)    "Makes me crazy"    HOME MEDICATIONS: Outpatient Medications Prior to Visit  Medication Sig Dispense Refill   allopurinol (ZYLOPRIM) 100 MG tablet Take 100 mg by mouth daily.     diphenhydramine-acetaminophen (TYLENOL PM) 25-500 MG TABS tablet Take 1 tablet by mouth at bedtime as needed (sleep).     divalproex (DEPAKOTE) 250 MG DR tablet Take 250 mg by mouth 2 (two) times daily.     donepezil (ARICEPT) 10 MG tablet Take 10 mg by mouth at bedtime.   0   DULoxetine (CYMBALTA) 30 MG capsule Take 30 mg by mouth daily.     ezetimibe (ZETIA) 10 MG tablet TAKE 1 TABLET BY MOUTH  EVERY DAY (Patient taking differently: Take 10 mg by mouth daily.) 90 tablet 1   finasteride (PROSCAR) 5 MG tablet Take 5 mg by mouth daily.     fludrocortisone (FLORINEF) 0.1 MG tablet Take 1 tablet (0.1 mg total) by mouth daily. 30 tablet 0   levothyroxine (SYNTHROID) 112 MCG tablet Take 112 mcg by mouth daily.     lidocaine (LIDODERM) 5 % Place 1 patch onto the skin daily.     memantine (NAMENDA) 10 MG tablet Take 1 tablet (10 mg total) by mouth 2 (two) times daily. (Patient taking differently: Take 10 mg by mouth at bedtime.) 60 tablet 11   mirtazapine (REMERON) 15 MG tablet Take 15 mg by mouth at  bedtime.     nitroGLYCERIN (NITROSTAT) 0.4 MG SL tablet Place 1 tablet (0.4 mg total) under the tongue every 5 (five) minutes as needed. (Patient taking differently: Place 0.4 mg under the tongue every 5 (five) minutes as needed for chest pain.) 75 tablet 2   oxyCODONE-acetaminophen (PERCOCET) 10-325 MG tablet Take 1 tablet by mouth 3 (three) times daily as needed for pain.     Polyethyl Glycol-Propyl Glycol (SYSTANE OP) Place 1 drop into both eyes 2 (two) times daily as needed (dry eyes).     sertraline (ZOLOFT) 100 MG tablet Take 100 mg by mouth daily.     tamsulosin (FLOMAX) 0.4 MG CAPS capsule Take 0.4 mg by mouth at bedtime as needed (when finasteride does not work).     Vitamin D, Ergocalciferol, (DRISDOL) 50000 units CAPS capsule Take 50,000 Units by mouth every Monday.  3   pregabalin (LYRICA) 75 MG capsule Take 75 mg by mouth 2 (two) times daily.     pantoprazole (PROTONIX) 40 MG tablet Take 1 tablet (40 mg total) by mouth daily before breakfast. 90 tablet 1   No facility-administered medications prior to visit.    PAST MEDICAL HISTORY: Past Medical History:  Diagnosis Date   Bilateral foot-drop 12/17/2019   BPH (benign prostatic hypertrophy)    Diverticulosis    ED (erectile dysfunction)    Gait abnormality 12/17/2019   GERD (gastroesophageal reflux disease)    Gout    Hepatitis    hx hepatitis after mono as teenager   History of kidney stones    History of skin cancer    Hyperlipidemia    Hypertension    Hypothyroidism    IHD (ischemic heart disease)    Remote CABG in 1997   Memory disorder 12/17/2019   MI, acute, non ST segment elevation ([place]) 2010   s/p cath with occluded SVG to PD that fills by collaterals and the remainder of his revasculsarization is satisfactory. "the first time they did the stent , the second time they did the graft 8 vessels"   Nocturia    Thrombocytopenia ([place]) 11/19/2018   Tremor, essential 12/17/2019   Urinary frequency     PAST SURGICAL  HISTORY: Past Surgical History:  Procedure Laterality Date   APPENDECTOMY     BALLOON DILATION N/A 03/25/2019   Procedure: BALLOON DILATION;  Surgeon: Otis Brace, MD;  Location: [facility];  Service: Gastroenterology;  Laterality: N/A;   CARDIAC CATHETERIZATION  09/08/2008   NORMAL. EF 60%; Occluded SVG to PD that fills by collaterals and the remainder of his revascularization is satisfactory. he is managed medically.    CORONARY ARTERY BYPASS GRAFT  1997   CABG x 8   CORONARY STENT INTERVENTION N/A 11/18/2018   Procedure: CORONARY STENT INTERVENTION;  Surgeon: Burt Knack,  Legrand Como, MD;  Location: [facility];  Service: Cardiovascular;  Laterality: N/A;   CYSTOSCOPY WITH INSERTION OF UROLIFT N/A 03/19/2015   Procedure: CYSTOSCOPY WITH INSERTION OF FOUR UROLIFTS;  Surgeon: Carolan Clines, MD;  Location: [facility];  Service: Urology;  Laterality: N/A;   ESOPHAGOGASTRODUODENOSCOPY (EGD) WITH PROPOFOL N/A 02/12/2017   Procedure: ESOPHAGOGASTRODUODENOSCOPY (EGD) WITH PROPOFOL;  Surgeon: Otis Brace, MD;  Location: [facility];  Service: Gastroenterology;  Laterality: N/A;   ESOPHAGOGASTRODUODENOSCOPY (EGD) WITH PROPOFOL N/A 07/13/2018   Procedure: ESOPHAGOGASTRODUODENOSCOPY (EGD) WITH PROPOFOL;  Surgeon: Clarene Essex, MD;  Location: [facility];  Service: Endoscopy;  Laterality: N/A;   ESOPHAGOGASTRODUODENOSCOPY (EGD) WITH PROPOFOL N/A 03/25/2019   Procedure: ESOPHAGOGASTRODUODENOSCOPY (EGD) WITH PROPOFOL;  Surgeon: Otis Brace, MD;  Location: [facility];  Service: Gastroenterology;  Laterality: N/A;   ESOPHAGOGASTRODUODENOSCOPY (EGD) WITH PROPOFOL N/A 06/28/2021   Procedure: ESOPHAGOGASTRODUODENOSCOPY (EGD) WITH PROPOFOL;  Surgeon: Wilford Corner, MD;  Location: [facility];  Service: Endoscopy;  Laterality: N/A;   FOREIGN BODY REMOVAL N/A 07/13/2018   Procedure: FOREIGN BODY REMOVAL;  Surgeon: Clarene Essex, MD;  Location: [facility];  Service: Endoscopy;  Laterality: N/A;    IMPACTION REMOVAL  06/28/2021   Procedure: IMPACTION REMOVAL;  Surgeon: Wilford Corner, MD;  Location: [facility];  Service: Endoscopy;;   LEFT HEART CATH AND CORS/GRAFTS ANGIOGRAPHY N/A 11/18/2018   Procedure: LEFT HEART CATH AND CORS/GRAFTS ANGIOGRAPHY;  Surgeon: Sherren Mocha, MD;  Location: [facility];  Service: Cardiovascular;  Laterality: N/A;    FAMILY HISTORY: Family History  Problem Relation Age of Onset   Heart failure Mother 41    SOCIAL HISTORY: Social History   Socioeconomic History   Marital status: Married    Spouse name: [name]    Number of children: Not on file   Years of education: Not on file   Highest education level: 12th grade  Occupational History   Occupation: Retired    Fish farm manager: RETIRED    Comment: was in [employer]  Tobacco Use   Smoking status: Former    Packs/day: 1.00    Years: 15.00    Total pack years: 15.00    Types: Cigarettes    Quit date: 05/15/1958    Years since quitting: 63.5   Smokeless tobacco: Never  Vaping Use   Vaping Use: Never used  Substance and Sexual Activity   Alcohol use: Yes    Comment: occasional beer   Drug use: No   Sexual activity: Yes  Other Topics Concern   Not on file  Social History Narrative   Mr Gabrielson is a 88 year old retired male who lives at home with his wife, [name].     Social Determinants of Health   Financial Resource Strain: Low Risk  (03/26/2019)   Overall Financial Resource Strain (CARDIA)    Difficulty of Paying Living Expenses: Not hard at all  Food Insecurity: No Food Insecurity (03/03/2019)   Hunger Vital Sign    Worried About Running Out of Food in the Last Year: Never true    Ran Out of Food in the Last Year: Never true  Transportation Needs: No Transportation Needs (03/03/2019)   PRAPARE - Hydrologist (Medical): No    Lack of Transportation (Non-Medical): No  Physical Activity: Not on file  Stress: Not on file  Social Connections: Socially  Isolated (03/26/2019)   Social Connection and Isolation Panel [NHANES]    Frequency of Communication with Friends and Family: Once a week  Frequency of Social Gatherings with Friends and Family: Once a week    Attends Religious Services: Never    Marine scientist or Organizations: No    Attends Archivist Meetings: Never    Marital Status: Married  Human resources officer Violence: Not At Risk (03/26/2019)   Humiliation, Afraid, Rape, and Kick questionnaire    Fear of Current or Ex-Partner: No    Emotionally Abused: No    Physically Abused: No    Sexually Abused: No    PHYSICAL EXAM  GENERAL EXAM/CONSTITUTIONAL: Vitals:  Vitals:   11/24/21 1336  BP: (!) 157/69  Pulse: 63  Weight: 152 lb 8 oz (69.2 kg)  Height: 6' (1.829 m)    Body mass index is 20.68 kg/m. Wt Readings from Last 3 Encounters:  11/24/21 152 lb 8 oz (69.2 kg)  11/07/21 180 lb (81.6 kg)  09/27/21 156 lb 8 oz (71 kg)   Patient is in no distress; well developed, nourished and groomed; neck is supple  EYES: Pupils round and reactive to light, Visual fields full to confrontation, Extraocular movements intacts,   MUSCULOSKELETAL: Gait, strength, tone, movements noted in Neurologic exam below  NEUROLOGIC: MENTAL STATUS:     12/17/2019   11:43 AM  MMSE - Mini Mental State Exam  Orientation to time 3  Orientation to Place 5  Registration 3  Attention/ Calculation 5  Recall 0  Language- name 2 objects 2  Language- repeat 1  Language- follow 3 step command 3  Language- read & follow direction 1  Write a sentence 1  Copy design 0  Total score 24   awake, alert, oriented to person language fluent, comprehension intact, naming intact   CRANIAL NERVE:  2nd, 3rd, 4th, 6th - pupils equal and reactive to light, visual fields full to confrontation, extraocular muscles intact, no nystagmus 5th - facial sensation symmetric 7th - facial strength symmetric 8th - hearing intact 9th - palate  elevates symmetrically, uvula midline 11th - shoulder shrug symmetric 12th - tongue protrusion midline  MOTOR:  normal bulk and tone, full strength in the BUE, BLE  SENSORY:  Decrease sensation to pinprick to mid shin, vibration up to knees and decrease proprioception bilaterally.  COORDINATION:  finger-nose-finger   GAIT/STATION:  Wide base gait, unsteady, uncoordinated, leaning to the left    DIAGNOSTIC DATA (LABS, IMAGING, TESTING) - I reviewed patient records, labs, notes, testing and imaging myself where available.  Lab Results  Component Value Date   WBC 7.5 11/09/2021   HGB 10.8 (L) 11/09/2021   HCT 33.0 (L) 11/09/2021   MCV 97.9 11/09/2021   PLT 71 (L) 11/09/2021      Component Value Date/Time   NA 139 11/09/2021 0144   NA 139 03/16/2021 1155   K 3.6 11/09/2021 0144   CL 109 11/09/2021 0144   CO2 21 (L) 11/09/2021 0144   GLUCOSE 91 11/09/2021 0144   BUN 20 11/09/2021 0144   BUN 19 03/16/2021 1155   CREATININE 1.06 11/09/2021 0144   CALCIUM 7.8 (L) 11/09/2021 0144   PROT 6.9 03/02/2021 1117   ALBUMIN 4.5 03/02/2021 1117   AST 15 03/02/2021 1117   ALT 11 03/02/2021 1117   ALKPHOS 79 03/02/2021 1117   BILITOT 0.5 03/02/2021 1117   GFRNONAA >60 11/09/2021 0144   GFRAA >60 06/24/2019 0327   Lab Results  Component Value Date   CHOL 133 03/02/2021   HDL 43 03/02/2021   LDLCALC 72 03/02/2021   TRIG 92 03/02/2021  CHOLHDL 3.1 03/02/2021   No results found for: "HGBA1C" Lab Results  Component Value Date   VITAMINB12 353 11/08/2021   Lab Results  Component Value Date   TSH 0.646 11/08/2021    Head CT 09/25/21 No acute intracranial abnormality  Head CT 11/08/2021 1. 7 mm acute intraparenchymal hemorrhage at the parasagittal posterior left frontal lobe. No associated mass effect. 2. No other acute intracranial abnormality. 3. Generalized cerebral atrophy with moderately advanced chronic small vessel ischemic disease. Remote lacunar infarct at the  right basal ganglia.  Routine EEG 10/04/21: Normal EEG   ASSESSMENT AND PLAN  88 y.o. year old male with dementia, peripheral neuropathy, hypothyroidism, hypotension, thrombocytopenia who is presenting for follow up after recently being discharged from the hospital for an episode hyperthermia and electrolyte imbalance after being laying in the sun for more than 3 hours. At present he is back to his baseline but still refuses to use a walker. He is a fall risk and I have spent extended amount of time convincing him to use his walker. He is agreeable to that.  For his thrombocytopenia, he is pending hematology visit and work up. Due to his prolonged QT noted during his hospitalization, agree to hold Aricept. I will see him in 2 months for follow up or sooner if worse.     1. Dementia, unspecified dementia severity, unspecified dementia type, unspecified whether behavioral, psychotic, or mood disturbance or anxiety ([place])   2. Idiopathic peripheral neuropathy   3. Gait abnormality   4. Vitamin B12 deficiency      Patient Instructions  Continue to hold Aricept (prolonged QT) and Aspirin (thrombocytopenia) Continue all other medications Increase Lytica to 75 mg AM and 150 mg PM  Recommend to use walker all the time  Consider Vitamin B12 supplementation  Follow up in 2 months   No orders of the defined types were placed in this encounter.   Meds ordered this encounter  Medications   pregabalin (LYRICA) 75 MG capsule    Sig: Take 1 capsule (75 mg total) by mouth in the morning AND 2 capsules (150 mg total) every evening.    Dispense:  90 capsule    Refill:  6    Return in about 2 months (around 01/25/2022).  I have spent a total of 50 minutes dedicated to this patient today, preparing to see patient, performing a medically appropriate examination and evaluation, ordering tests and/or medications and procedures, and counseling and educating the patient/family/caregiver; independently  interpreting result and communicating results to the family/patient/caregiver; and documenting clinical information in the electronic medical record.    Alric Ran, MD 11/24/2021, 5:30 PM  [facility] [address] [place], [place] [address] [phone]

## 2021-11-24 NOTE — Patient Instructions (Addendum)
Continue to hold Aricept (prolonged QT) and Aspirin (thrombocytopenia) Continue all other medications Increase Lytica to 75 mg AM and 150 mg PM  Recommend to use walker all the time  Consider Vitamin B12 supplementation  Follow up in 2 months

## 2021-11-25 ENCOUNTER — Encounter: Payer: Medicare Other | Admitting: Family

## 2021-11-25 ENCOUNTER — Inpatient Hospital Stay: Payer: Medicare Other

## 2021-12-02 ENCOUNTER — Other Ambulatory Visit: Payer: Self-pay | Admitting: Family

## 2021-12-02 DIAGNOSIS — D696 Thrombocytopenia, unspecified: Secondary | ICD-10-CM

## 2021-12-05 ENCOUNTER — Other Ambulatory Visit: Payer: Self-pay

## 2021-12-05 ENCOUNTER — Inpatient Hospital Stay (HOSPITAL_BASED_OUTPATIENT_CLINIC_OR_DEPARTMENT_OTHER): Payer: Medicare Other | Admitting: Family

## 2021-12-05 ENCOUNTER — Emergency Department (HOSPITAL_BASED_OUTPATIENT_CLINIC_OR_DEPARTMENT_OTHER): Payer: Medicare Other

## 2021-12-05 ENCOUNTER — Encounter: Payer: Self-pay | Admitting: Family

## 2021-12-05 ENCOUNTER — Inpatient Hospital Stay: Payer: Medicare Other | Attending: Hematology & Oncology

## 2021-12-05 ENCOUNTER — Encounter (HOSPITAL_BASED_OUTPATIENT_CLINIC_OR_DEPARTMENT_OTHER): Payer: Self-pay | Admitting: Emergency Medicine

## 2021-12-05 ENCOUNTER — Inpatient Hospital Stay (HOSPITAL_BASED_OUTPATIENT_CLINIC_OR_DEPARTMENT_OTHER)
Admission: EM | Admit: 2021-12-05 | Discharge: 2021-12-09 | DRG: 086 | Disposition: A | Discharge status: Rehabilitation Facility | Payer: Medicare Other | Attending: Internal Medicine | Admitting: Internal Medicine

## 2021-12-05 VITALS — BP 152/65 | HR 84 | Temp 98.0°F | Resp 17

## 2021-12-05 DIAGNOSIS — E785 Hyperlipidemia, unspecified: Secondary | ICD-10-CM | POA: Diagnosis present

## 2021-12-05 DIAGNOSIS — Z515 Encounter for palliative care: Secondary | ICD-10-CM | POA: Diagnosis not present

## 2021-12-05 DIAGNOSIS — E039 Hypothyroidism, unspecified: Secondary | ICD-10-CM | POA: Diagnosis present

## 2021-12-05 DIAGNOSIS — K219 Gastro-esophageal reflux disease without esophagitis: Secondary | ICD-10-CM | POA: Diagnosis present

## 2021-12-05 DIAGNOSIS — I10 Essential (primary) hypertension: Secondary | ICD-10-CM | POA: Diagnosis not present

## 2021-12-05 DIAGNOSIS — Z951 Presence of aortocoronary bypass graft: Secondary | ICD-10-CM | POA: Diagnosis not present

## 2021-12-05 DIAGNOSIS — R300 Dysuria: Secondary | ICD-10-CM | POA: Diagnosis not present

## 2021-12-05 DIAGNOSIS — Y92019 Unspecified place in single-family (private) house as the place of occurrence of the external cause: Secondary | ICD-10-CM

## 2021-12-05 DIAGNOSIS — G8929 Other chronic pain: Secondary | ICD-10-CM | POA: Diagnosis present

## 2021-12-05 DIAGNOSIS — S066X0A Traumatic subarachnoid hemorrhage without loss of consciousness, initial encounter: Principal | ICD-10-CM | POA: Diagnosis present

## 2021-12-05 DIAGNOSIS — S066X9D Traumatic subarachnoid hemorrhage with loss of consciousness of unspecified duration, subsequent encounter: Secondary | ICD-10-CM | POA: Diagnosis not present

## 2021-12-05 DIAGNOSIS — G9608 Other cranial cerebrospinal fluid leak: Secondary | ICD-10-CM | POA: Diagnosis not present

## 2021-12-05 DIAGNOSIS — F03918 Unspecified dementia, unspecified severity, with other behavioral disturbance: Secondary | ICD-10-CM | POA: Diagnosis present

## 2021-12-05 DIAGNOSIS — G4700 Insomnia, unspecified: Secondary | ICD-10-CM | POA: Diagnosis not present

## 2021-12-05 DIAGNOSIS — Z79890 Hormone replacement therapy (postmenopausal): Secondary | ICD-10-CM

## 2021-12-05 DIAGNOSIS — R7989 Other specified abnormal findings of blood chemistry: Secondary | ICD-10-CM | POA: Diagnosis not present

## 2021-12-05 DIAGNOSIS — G629 Polyneuropathy, unspecified: Secondary | ICD-10-CM | POA: Diagnosis present

## 2021-12-05 DIAGNOSIS — I160 Hypertensive urgency: Secondary | ICD-10-CM | POA: Diagnosis present

## 2021-12-05 DIAGNOSIS — R3915 Urgency of urination: Secondary | ICD-10-CM | POA: Diagnosis not present

## 2021-12-05 DIAGNOSIS — D696 Thrombocytopenia, unspecified: Secondary | ICD-10-CM | POA: Diagnosis present

## 2021-12-05 DIAGNOSIS — Z79899 Other long term (current) drug therapy: Secondary | ICD-10-CM | POA: Diagnosis not present

## 2021-12-05 DIAGNOSIS — N1831 Chronic kidney disease, stage 3a: Secondary | ICD-10-CM | POA: Diagnosis not present

## 2021-12-05 DIAGNOSIS — I951 Orthostatic hypotension: Secondary | ICD-10-CM | POA: Diagnosis present

## 2021-12-05 DIAGNOSIS — Z888 Allergy status to other drugs, medicaments and biological substances status: Secondary | ICD-10-CM

## 2021-12-05 DIAGNOSIS — M21371 Foot drop, right foot: Secondary | ICD-10-CM | POA: Diagnosis present

## 2021-12-05 DIAGNOSIS — R251 Tremor, unspecified: Secondary | ICD-10-CM | POA: Diagnosis present

## 2021-12-05 DIAGNOSIS — I629 Nontraumatic intracranial hemorrhage, unspecified: Principal | ICD-10-CM

## 2021-12-05 DIAGNOSIS — I129 Hypertensive chronic kidney disease with stage 1 through stage 4 chronic kidney disease, or unspecified chronic kidney disease: Secondary | ICD-10-CM | POA: Diagnosis present

## 2021-12-05 DIAGNOSIS — N390 Urinary tract infection, site not specified: Secondary | ICD-10-CM | POA: Diagnosis present

## 2021-12-05 DIAGNOSIS — I611 Nontraumatic intracerebral hemorrhage in hemisphere, cortical: Secondary | ICD-10-CM

## 2021-12-05 DIAGNOSIS — S065XAA Traumatic subdural hemorrhage with loss of consciousness status unknown, initial encounter: Secondary | ICD-10-CM | POA: Diagnosis not present

## 2021-12-05 DIAGNOSIS — N400 Enlarged prostate without lower urinary tract symptoms: Secondary | ICD-10-CM | POA: Diagnosis present

## 2021-12-05 DIAGNOSIS — W19XXXA Unspecified fall, initial encounter: Secondary | ICD-10-CM | POA: Diagnosis present

## 2021-12-05 DIAGNOSIS — B379 Candidiasis, unspecified: Secondary | ICD-10-CM | POA: Diagnosis not present

## 2021-12-05 DIAGNOSIS — Z681 Body mass index (BMI) 19 or less, adult: Secondary | ICD-10-CM | POA: Diagnosis not present

## 2021-12-05 DIAGNOSIS — R296 Repeated falls: Secondary | ICD-10-CM | POA: Diagnosis present

## 2021-12-05 DIAGNOSIS — R000 Tachycardia, unspecified: Secondary | ICD-10-CM | POA: Diagnosis not present

## 2021-12-05 DIAGNOSIS — Z8249 Family history of ischemic heart disease and other diseases of the circulatory system: Secondary | ICD-10-CM

## 2021-12-05 DIAGNOSIS — F0393 Unspecified dementia, unspecified severity, with mood disturbance: Secondary | ICD-10-CM | POA: Diagnosis present

## 2021-12-05 DIAGNOSIS — W19XXXD Unspecified fall, subsequent encounter: Secondary | ICD-10-CM | POA: Diagnosis present

## 2021-12-05 DIAGNOSIS — K5903 Drug induced constipation: Secondary | ICD-10-CM | POA: Diagnosis not present

## 2021-12-05 DIAGNOSIS — I2510 Atherosclerotic heart disease of native coronary artery without angina pectoris: Secondary | ICD-10-CM | POA: Diagnosis present

## 2021-12-05 DIAGNOSIS — E860 Dehydration: Secondary | ICD-10-CM | POA: Diagnosis present

## 2021-12-05 DIAGNOSIS — N189 Chronic kidney disease, unspecified: Secondary | ICD-10-CM | POA: Diagnosis not present

## 2021-12-05 DIAGNOSIS — I252 Old myocardial infarction: Secondary | ICD-10-CM

## 2021-12-05 DIAGNOSIS — M109 Gout, unspecified: Secondary | ICD-10-CM | POA: Diagnosis present

## 2021-12-05 DIAGNOSIS — S066X9S Traumatic subarachnoid hemorrhage with loss of consciousness of unspecified duration, sequela: Secondary | ICD-10-CM | POA: Diagnosis not present

## 2021-12-05 DIAGNOSIS — M545 Low back pain, unspecified: Secondary | ICD-10-CM | POA: Diagnosis present

## 2021-12-05 DIAGNOSIS — N179 Acute kidney failure, unspecified: Secondary | ICD-10-CM | POA: Diagnosis not present

## 2021-12-05 DIAGNOSIS — S066XAS Traumatic subarachnoid hemorrhage with loss of consciousness status unknown, sequela: Secondary | ICD-10-CM | POA: Diagnosis not present

## 2021-12-05 DIAGNOSIS — H5461 Unqualified visual loss, right eye, normal vision left eye: Secondary | ICD-10-CM | POA: Diagnosis present

## 2021-12-05 DIAGNOSIS — D693 Immune thrombocytopenic purpura: Secondary | ICD-10-CM | POA: Diagnosis not present

## 2021-12-05 DIAGNOSIS — R531 Weakness: Secondary | ICD-10-CM | POA: Diagnosis not present

## 2021-12-05 DIAGNOSIS — E440 Moderate protein-calorie malnutrition: Secondary | ICD-10-CM | POA: Diagnosis not present

## 2021-12-05 DIAGNOSIS — Z955 Presence of coronary angioplasty implant and graft: Secondary | ICD-10-CM

## 2021-12-05 DIAGNOSIS — M21372 Foot drop, left foot: Secondary | ICD-10-CM | POA: Diagnosis present

## 2021-12-05 DIAGNOSIS — R4781 Slurred speech: Secondary | ICD-10-CM | POA: Diagnosis not present

## 2021-12-05 DIAGNOSIS — F0390 Unspecified dementia without behavioral disturbance: Secondary | ICD-10-CM | POA: Diagnosis present

## 2021-12-05 DIAGNOSIS — F411 Generalized anxiety disorder: Secondary | ICD-10-CM | POA: Diagnosis not present

## 2021-12-05 DIAGNOSIS — E538 Deficiency of other specified B group vitamins: Secondary | ICD-10-CM | POA: Diagnosis present

## 2021-12-05 DIAGNOSIS — Z7189 Other specified counseling: Secondary | ICD-10-CM | POA: Diagnosis not present

## 2021-12-05 HISTORY — DX: Esophageal obstruction: K22.2

## 2021-12-05 HISTORY — DX: Personal history of traumatic brain injury: Z87.820

## 2021-12-05 LAB — CMP (CANCER CENTER ONLY)
ALT: 15 U/L (ref 0–44)
AST: 17 U/L (ref 15–41)
Albumin: 4.7 g/dL (ref 3.5–5.0)
Alkaline Phosphatase: 57 U/L (ref 38–126)
Anion gap: 8 (ref 5–15)
BUN: 23 mg/dL (ref 8–23)
CO2: 29 mmol/L (ref 22–32)
Calcium: 9.3 mg/dL (ref 8.9–10.3)
Chloride: 102 mmol/L (ref 98–111)
Creatinine: 1.33 mg/dL — ABNORMAL HIGH (ref 0.61–1.24)
GFR, Estimated: 50 mL/min — ABNORMAL LOW (ref >60)
Glucose, Bld: 100 mg/dL — ABNORMAL HIGH (ref 70–99)
Potassium: 3.8 mmol/L (ref 3.5–5.1)
Sodium: 139 mmol/L (ref 135–145)
Total Bilirubin: 0.7 mg/dL (ref 0.3–1.2)
Total Protein: 7 g/dL (ref 6.5–8.1)

## 2021-12-05 LAB — LACTATE DEHYDROGENASE: LDH: 147 U/L (ref 98–192)

## 2021-12-05 LAB — CBC WITH DIFFERENTIAL (CANCER CENTER ONLY)
Abs Immature Granulocytes: 0.21 10*3/uL — ABNORMAL HIGH (ref 0.00–0.07)
Basophils Absolute: 0 10*3/uL (ref 0.0–0.1)
Basophils Relative: 0 %
Eosinophils Absolute: 0 10*3/uL (ref 0.0–0.5)
Eosinophils Relative: 0 %
HCT: 41.8 % (ref 39.0–52.0)
Hemoglobin: 13.5 g/dL (ref 13.0–17.0)
Immature Granulocytes: 3 %
Lymphocytes Relative: 18 %
Lymphs Abs: 1.3 10*3/uL (ref 0.7–4.0)
MCH: 31.7 pg (ref 26.0–34.0)
MCHC: 32.3 g/dL (ref 30.0–36.0)
MCV: 98.1 fL (ref 80.0–100.0)
Monocytes Absolute: 2 10*3/uL — ABNORMAL HIGH (ref 0.1–1.0)
Monocytes Relative: 27 %
Neutro Abs: 3.8 10*3/uL (ref 1.7–7.7)
Neutrophils Relative %: 52 %
Platelet Count: 100 10*3/uL — ABNORMAL LOW (ref 150–400)
RBC: 4.26 MIL/uL (ref 4.22–5.81)
RDW: 13.5 % (ref 11.5–15.5)
WBC Count: 7.3 10*3/uL (ref 4.0–10.5)
nRBC: 0 % (ref 0.0–0.2)

## 2021-12-05 LAB — COMPREHENSIVE METABOLIC PANEL
ALT: 21 U/L (ref 0–44)
AST: 22 U/L (ref 15–41)
Albumin: 4.7 g/dL (ref 3.5–5.0)
Alkaline Phosphatase: 65 U/L (ref 38–126)
Anion gap: 9 (ref 5–15)
BUN: 22 mg/dL (ref 8–23)
CO2: 28 mmol/L (ref 22–32)
Calcium: 9.1 mg/dL (ref 8.9–10.3)
Chloride: 102 mmol/L (ref 98–111)
Creatinine, Ser: 1.22 mg/dL (ref 0.61–1.24)
GFR, Estimated: 56 mL/min — ABNORMAL LOW (ref >60)
Glucose, Bld: 101 mg/dL — ABNORMAL HIGH (ref 70–99)
Potassium: 3.9 mmol/L (ref 3.5–5.1)
Sodium: 139 mmol/L (ref 135–145)
Total Bilirubin: 0.8 mg/dL (ref 0.3–1.2)
Total Protein: 8 g/dL (ref 6.5–8.1)

## 2021-12-05 LAB — CBC WITH DIFFERENTIAL/PLATELET
Abs Immature Granulocytes: 0.29 10*3/uL — ABNORMAL HIGH (ref 0.00–0.07)
Basophils Absolute: 0 10*3/uL (ref 0.0–0.1)
Basophils Relative: 0 %
Eosinophils Absolute: 0 10*3/uL (ref 0.0–0.5)
Eosinophils Relative: 0 %
HCT: 44.8 % (ref 39.0–52.0)
Hemoglobin: 14.8 g/dL (ref 13.0–17.0)
Immature Granulocytes: 4 %
Lymphocytes Relative: 16 %
Lymphs Abs: 1.3 10*3/uL (ref 0.7–4.0)
MCH: 32.4 pg (ref 26.0–34.0)
MCHC: 33 g/dL (ref 30.0–36.0)
MCV: 98 fL (ref 80.0–100.0)
Monocytes Absolute: 2.4 10*3/uL — ABNORMAL HIGH (ref 0.1–1.0)
Monocytes Relative: 30 %
Neutro Abs: 3.9 10*3/uL (ref 1.7–7.7)
Neutrophils Relative %: 50 %
Platelets: 106 10*3/uL — ABNORMAL LOW (ref 150–400)
RBC: 4.57 MIL/uL (ref 4.22–5.81)
RDW: 13.6 % (ref 11.5–15.5)
Smear Review: DECREASED
WBC: 7.8 10*3/uL (ref 4.0–10.5)
nRBC: 0 % (ref 0.0–0.2)

## 2021-12-05 LAB — URINALYSIS, MICROSCOPIC (REFLEX): WBC, UA: NONE SEEN WBC/hpf (ref 0–5)

## 2021-12-05 LAB — URINALYSIS, ROUTINE W REFLEX MICROSCOPIC
Bilirubin Urine: NEGATIVE
Glucose, UA: NEGATIVE mg/dL
Ketones, ur: NEGATIVE mg/dL
Leukocytes,Ua: NEGATIVE
Nitrite: NEGATIVE
Protein, ur: NEGATIVE mg/dL
Specific Gravity, Urine: 1.025 (ref 1.005–1.030)
pH: 5.5 (ref 5.0–8.0)

## 2021-12-05 LAB — TROPONIN I (HIGH SENSITIVITY)
Troponin I (High Sensitivity): 12 ng/L (ref <18)
Troponin I (High Sensitivity): 11 ng/L (ref <18)

## 2021-12-05 LAB — TSH: TSH: 0.428 u[IU]/mL (ref 0.350–4.500)

## 2021-12-05 LAB — SAVE SMEAR(SSMR), FOR PROVIDER SLIDE REVIEW

## 2021-12-05 MED ORDER — SODIUM CHLORIDE 0.9 % IV SOLN: INTRAVENOUS | Status: DC | PRN

## 2021-12-05 MED ORDER — SODIUM CHLORIDE 0.9 % IV BOLUS
1000.0000 mL | Freq: Once | INTRAVENOUS | Status: AC
  Administered 2021-12-05: 1000 mL via INTRAVENOUS

## 2021-12-05 MED ORDER — HYDRALAZINE HCL 20 MG/ML IJ SOLN
5.0000 mg | Freq: Once | INTRAMUSCULAR | Status: AC
  Administered 2021-12-05: 5 mg via INTRAVENOUS
  Filled 2021-12-05: qty 1

## 2021-12-05 NOTE — Progress Notes (Unsigned)
Hematology/Oncology Consultation   Name: Patrick [name]      MRN: [id number]    Location: Room/bed info not found  Date: 12/05/2021 Time:4:50 PM   REFERRING PHYSICIAN: Lendon Ka. Carlynn [name]., MD  REASON FOR CONSULT:  Thrombocytopenia    DIAGNOSIS: Mild thrombocytopenia   HISTORY OF PRESENT ILLNESS: Patrick [name] is a very pleasant 88 yo caucasian gentleman with history of mild thrombocytopenia for the last 13 years.  He was recently hospitalized with cerebral parenchymal hemorrhage and had presented with lethargy and slurred speech.  MRI was negative for an acute stroke at he time but he was noted to have sever stenosis in the posterior circulation.  He stopped his aspirin at that time.  Information today collected some from the patient and his wife Patrick [name].  He had a fall this morning at 2:30 am going to the bathroom and states that he does not think he hit his head. He did not lose consciousness.  His wife states that after discharge from the hospital in June his speech had been clear and he had not been lethargic until the last few days. Today he had the fall and she notes that his voice is hoarse, speech isn't as clear and he is lethargic sleeping up to 17 hours a day.  He is oriented to person and time but does not remember getting into the wheelchair to be brought into the office today due to weakness. He thought he had walked into the clinic.  He denies having any issues swallowing.  Platelets are stable at 100, Hgb 13.5, MCV 98, WBC count 7.3.  He has not noted any obvious blood loss. No bruising or petechiae.  No fever, chills, n/v, cough, rash, dizziness, SOB, chest pain, palpitations, abdominal pain or changes in bowel or bladder habits.  His father had history of brain aneurysm.  No personal or known familial history of cancer.  He has history of CAD and orthostatic hypotension.  No history of diabetes. He has hypothyroidism and is currently on synthroid.  No smoking, ETOH or  recreational drug use.  His appetite has been less with the fatigue. His wife states that he is not hydrating well throughout the day. He does not want water.  He was unable to stand for a weight.   ROS: All other 10 point review of systems is negative.   PAST MEDICAL HISTORY:   Past Medical History:  Diagnosis Date   Bilateral foot-drop 12/17/2019   BPH (benign prostatic hypertrophy)    Diverticulosis    ED (erectile dysfunction)    Gait abnormality 12/17/2019   GERD (gastroesophageal reflux disease)    Gout    Hepatitis    hx hepatitis after mono as teenager   History of kidney stones    History of skin cancer    Hyperlipidemia    Hypertension    Hypothyroidism    IHD (ischemic heart disease)    Remote CABG in 1997   Memory disorder 12/17/2019   MI, acute, non ST segment elevation ([place]) 2010   s/p cath with occluded SVG to PD that fills by collaterals and the remainder of his revasculsarization is satisfactory. "the first time they did the stent , the second time they did the graft 8 vessels"   Nocturia    Thrombocytopenia ([place]) 11/19/2018   Tremor, essential 12/17/2019   Urinary frequency     ALLERGIES: Allergies  Allergen Reactions   Other Other (See Comments)   Pravachol     Unknown  Triazolam Other (See Comments)    "Makes me crazy"      MEDICATIONS:  No current facility-administered medications on file prior to visit.   Current Outpatient Medications on File Prior to Visit  Medication Sig Dispense Refill   allopurinol (ZYLOPRIM) 100 MG tablet Take 100 mg by mouth daily.     diphenhydramine-acetaminophen (TYLENOL PM) 25-500 MG TABS tablet Take 1 tablet by mouth at bedtime as needed (sleep).     divalproex (DEPAKOTE) 250 MG DR tablet Take 250 mg by mouth 2 (two) times daily.     DULoxetine (CYMBALTA) 30 MG capsule Take 30 mg by mouth daily.     ezetimibe (ZETIA) 10 MG tablet TAKE 1 TABLET BY MOUTH EVERY DAY (Patient taking differently: Take 10 mg by mouth daily.) 90  tablet 1   finasteride (PROSCAR) 5 MG tablet Take 5 mg by mouth daily.     fludrocortisone (FLORINEF) 0.1 MG tablet Take 1 tablet (0.1 mg total) by mouth daily. 30 tablet 0   levothyroxine (SYNTHROID) 112 MCG tablet Take 112 mcg by mouth daily.     lidocaine (LIDODERM) 5 % Place 1 patch onto the skin daily.     memantine (NAMENDA) 10 MG tablet Take 1 tablet (10 mg total) by mouth 2 (two) times daily. (Patient taking differently: Take 10 mg by mouth at bedtime.) 60 tablet 11   mirtazapine (REMERON) 15 MG tablet Take 15 mg by mouth at bedtime.     nitroGLYCERIN (NITROSTAT) 0.4 MG SL tablet Place 1 tablet (0.4 mg total) under the tongue every 5 (five) minutes as needed. (Patient taking differently: Place 0.4 mg under the tongue every 5 (five) minutes as needed for chest pain.) 75 tablet 2   oxyCODONE-acetaminophen (PERCOCET) 10-325 MG tablet Take 1 tablet by mouth 3 (three) times daily as needed for pain.     Polyethyl Glycol-Propyl Glycol (SYSTANE OP) Place 1 drop into both eyes 2 (two) times daily as needed (dry eyes).     pregabalin (LYRICA) 75 MG capsule Take 1 capsule (75 mg total) by mouth in the morning AND 2 capsules (150 mg total) every evening. 90 capsule 6   sertraline (ZOLOFT) 100 MG tablet Take 100 mg by mouth daily.     tamsulosin (FLOMAX) 0.4 MG CAPS capsule Take 0.4 mg by mouth at bedtime as needed (when finasteride does not work).     Vitamin D, Ergocalciferol, (DRISDOL) 50000 units CAPS capsule Take 50,000 Units by mouth every Monday.  3   donepezil (ARICEPT) 10 MG tablet Take 10 mg by mouth at bedtime.  (Patient not taking: Reported on 12/05/2021)  0   pantoprazole (PROTONIX) 40 MG tablet Take 1 tablet (40 mg total) by mouth daily before breakfast. 90 tablet 1     PAST SURGICAL HISTORY Past Surgical History:  Procedure Laterality Date   APPENDECTOMY     BALLOON DILATION N/A 03/25/2019   Procedure: BALLOON DILATION;  Surgeon: Otis Brace, MD;  Location: [facility];   Service: Gastroenterology;  Laterality: N/A;   CARDIAC CATHETERIZATION  09/08/2008   NORMAL. EF 60%; Occluded SVG to PD that fills by collaterals and the remainder of his revascularization is satisfactory. he is managed medically.    CORONARY ARTERY BYPASS GRAFT  1997   CABG x 8   CORONARY STENT INTERVENTION N/A 11/18/2018   Procedure: CORONARY STENT INTERVENTION;  Surgeon: Sherren Mocha, MD;  Location: [facility];  Service: Cardiovascular;  Laterality: N/A;   CYSTOSCOPY WITH INSERTION OF UROLIFT N/A 03/19/2015  Procedure: CYSTOSCOPY WITH INSERTION OF FOUR UROLIFTS;  Surgeon: Carolan Clines, MD;  Location: [facility];  Service: Urology;  Laterality: N/A;   ESOPHAGOGASTRODUODENOSCOPY (EGD) WITH PROPOFOL N/A 02/12/2017   Procedure: ESOPHAGOGASTRODUODENOSCOPY (EGD) WITH PROPOFOL;  Surgeon: Otis Brace, MD;  Location: [facility];  Service: Gastroenterology;  Laterality: N/A;   ESOPHAGOGASTRODUODENOSCOPY (EGD) WITH PROPOFOL N/A 07/13/2018   Procedure: ESOPHAGOGASTRODUODENOSCOPY (EGD) WITH PROPOFOL;  Surgeon: Clarene Essex, MD;  Location: [facility];  Service: Endoscopy;  Laterality: N/A;   ESOPHAGOGASTRODUODENOSCOPY (EGD) WITH PROPOFOL N/A 03/25/2019   Procedure: ESOPHAGOGASTRODUODENOSCOPY (EGD) WITH PROPOFOL;  Surgeon: Otis Brace, MD;  Location: [facility];  Service: Gastroenterology;  Laterality: N/A;   ESOPHAGOGASTRODUODENOSCOPY (EGD) WITH PROPOFOL N/A 06/28/2021   Procedure: ESOPHAGOGASTRODUODENOSCOPY (EGD) WITH PROPOFOL;  Surgeon: Wilford Corner, MD;  Location: [facility];  Service: Endoscopy;  Laterality: N/A;   FOREIGN BODY REMOVAL N/A 07/13/2018   Procedure: FOREIGN BODY REMOVAL;  Surgeon: Clarene Essex, MD;  Location: [facility];  Service: Endoscopy;  Laterality: N/A;   IMPACTION REMOVAL  06/28/2021   Procedure: IMPACTION REMOVAL;  Surgeon: Wilford Corner, MD;  Location: [facility];  Service: Endoscopy;;   LEFT HEART CATH AND CORS/GRAFTS ANGIOGRAPHY N/A 11/18/2018    Procedure: LEFT HEART CATH AND CORS/GRAFTS ANGIOGRAPHY;  Surgeon: Sherren Mocha, MD;  Location: [facility];  Service: Cardiovascular;  Laterality: N/A;    FAMILY HISTORY: Family History  Problem Relation Age of Onset   Heart failure Mother 2    SOCIAL HISTORY:  reports that he quit smoking about 63 years ago. His smoking use included cigarettes. He has a 15.00 pack-year smoking history. He has never used smokeless tobacco. He reports current alcohol use. He reports that he does not use drugs.  PERFORMANCE STATUS: The patient's performance status is 3 - Symptomatic, >50% confined to bed  PHYSICAL EXAM: Most Recent Vital Signs: Blood pressure (!) 152/65, pulse 84, temperature 98 F (36.7 C), temperature source Oral, resp. rate 17, SpO2 98 %. BP (!) 152/65 (BP Location: Left Arm, Patient Position: Sitting)   Pulse 84   Temp 98 F (36.7 C) (Oral)   Resp 17   SpO2 98%   General Appearance:    Alert, cooperative, no distress, appears stated age  Head:    Normocephalic, without obvious abnormality, atraumatic  Eyes:    PERRL, conjunctiva/corneas clear, EOM's intact, fundi    benign, both eyes             Throat:   Lips, mucosa, and tongue normal; teeth and gums normal  Neck:   Supple, symmetrical, trachea midline, no adenopathy;       thyroid:  No enlargement/tenderness/nodules; no carotid   bruit or JVD  Back:     Symmetric, no curvature, ROM normal, no CVA tenderness  Lungs:     Clear to auscultation bilaterally, respirations unlabored  Chest wall:    No tenderness or deformity  Heart:    Regular rate and rhythm, S1 and S2 normal, no murmur, rub   or gallop  Abdomen:     Soft, non-tender, bowel sounds active all four quadrants,    no masses, no organomegaly        Extremities:   Extremities normal, atraumatic, no cyanosis or edema  Pulses:   2+ and symmetric all extremities  Skin:   Skin color, texture, turgor normal, no rashes or lesions  Lymph nodes:    Cervical, supraclavicular, and axillary nodes normal  Neurologic:   Oriented to person, place and time, speech is slurred,  patient weak and unable to stand and ambulate without assistance    LABORATORY DATA:  Results for orders placed or performed in visit on 12/05/21 (from the past 48 hour(s))  CBC with Differential ([facility] Only)     Status: Abnormal   Collection Time: 12/05/21  2:25 PM  Result Value Ref Range   WBC Count 7.3 4.0 - 10.5 K/uL   RBC 4.26 4.22 - 5.81 MIL/uL   Hemoglobin 13.5 13.0 - 17.0 g/dL   HCT 41.8 39.0 - 52.0 %   MCV 98.1 80.0 - 100.0 fL   MCH 31.7 26.0 - 34.0 pg   MCHC 32.3 30.0 - 36.0 g/dL   RDW 13.5 11.5 - 15.5 %   Platelet Count 100 (L) 150 - 400 K/uL   nRBC 0.0 0.0 - 0.2 %   Neutrophils Relative % 52 %   Neutro Abs 3.8 1.7 - 7.7 K/uL   Lymphocytes Relative 18 %   Lymphs Abs 1.3 0.7 - 4.0 K/uL   Monocytes Relative 27 %   Monocytes Absolute 2.0 (H) 0.1 - 1.0 K/uL   Eosinophils Relative 0 %   Eosinophils Absolute 0.0 0.0 - 0.5 K/uL   Basophils Relative 0 %   Basophils Absolute 0.0 0.0 - 0.1 K/uL   Immature Granulocytes 3 %   Abs Immature Granulocytes 0.21 (H) 0.00 - 0.07 K/uL    Comment: Performed at [facility] at [facility], [address] [place], [place] [address]  CMP ([place] only)     Status: Abnormal   Collection Time: 12/05/21  2:25 PM  Result Value Ref Range   Sodium 139 135 - 145 mmol/L   Potassium 3.8 3.5 - 5.1 mmol/L   Chloride 102 98 - 111 mmol/L   CO2 29 22 - 32 mmol/L   Glucose, Bld 100 (H) 70 - 99 mg/dL    Comment: Glucose reference range applies only to samples taken after fasting for at least 8 hours.   BUN 23 8 - 23 mg/dL   Creatinine 1.33 (H) 0.61 - 1.24 mg/dL   Calcium 9.3 8.9 - 10.3 mg/dL   Total Protein 7.0 6.5 - 8.1 g/dL   Albumin 4.7 3.5 - 5.0 g/dL   AST 17 15 - 41 U/L   ALT 15 0 - 44 U/L   Alkaline Phosphatase 57 38 - 126 U/L   Total Bilirubin 0.7 0.3 - 1.2 mg/dL   GFR,  Estimated 50 (L) >60 mL/min    Comment: (NOTE) Calculated using the CKD-EPI Creatinine Equation (2021)    Anion gap 8 5 - 15    Comment: Performed at [name] at [facility], [address] [place], [place] [address]  Lactate dehydrogenase (LDH)     Status: None   Collection Time: 12/05/21  2:25 PM  Result Value Ref Range   LDH 147 98 - 192 U/L    Comment: Performed at [facility] at [facility], [address] [place], [place] [address]  Save Smear [facility])     Status: None   Collection Time: 12/05/21  2:25 PM  Result Value Ref Range   Smear Review SMEAR STAINED AND AVAILABLE FOR REVIEW     Comment: Performed at [facility] at [facility], [address], [place], [place] [address]      RADIOGRAPHY: No results found.     PATHOLOGY: None  ASSESSMENT/PLAN: Mr.  Hasley is a very pleasant 88 yo caucasian gentleman with history of mild thrombocytopenia for the last 13 years.  His platelets count today is stable to improved at 100. Blood smear reviewed with Dr. Marin Olp and platelets are large in size. 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  He was immediately sent to the ED from our office due to above mentioned symptoms and awaiting admission to [facility].  We will follow-up after hospital discharge.   All questions were answered. The patient knows to call the clinic with any problems, questions or concerns. We can certainly see the patient much sooner if necessary.  The patient was discussed with Dr. Marin Olp and he is in agreement with the aforementioned.   Lottie Dawson, NP

## 2021-12-05 NOTE — Progress Notes (Signed)
88 year old male status post multiple falls.  Recently hospitalized for a left frontal intracerebral hemorrhage.  Now with a new very small focus of parasagittal hemorrhage.  No associated mass effect.  Chronic stable bilateral subdural hygromas.  No indication for neurosurgical intervention.  I do not feel that follow-up imaging is required.  Please contact me for any worsening.

## 2021-12-05 NOTE — ED Triage Notes (Signed)
Wife reports two mechanical falls over the past few days, last fall early this morning. Reports pt hit his head. Denies blood thinners. Also endorses progressive weakness and slurred speech that started today. Sent by PCP. Hx of intracranial hemorrhage.

## 2021-12-05 NOTE — ED Notes (Signed)
Pt given mouth swabs to moisten mouth.

## 2021-12-05 NOTE — ED Provider Notes (Signed)
[facility] EMERGENCY DEPARTMENT Provider Note   CSN: [id number] Arrival date & time: 12/05/21  1604     History  Chief Complaint  Patient presents with   Fall   Weakness    Patrick [name] is a 88 y.o. male.  Patient is a 88 year old male with past medical history of memory disability, bilateral foot drop, and tremor presenting for fall.  Patient had a fall at 4:30 AM this morning with 2 additional falls over the past week.  Patient recently charged from the hospital on 6/28 for thrombocytopenia and intraparenchymal hemorrhage.  Patient no longer on blood thinners since this time.  Patient had blunt head trauma during fall.  No neck pain or spinal tenderness.  Wife at bedside admits to recent dehydration secondary to decreased p.o. intake.  The patient's urine has been dark.  Admits to increased disorientation.  Patient also has abnormal speech likely secondary to dry mouth.  Denies any difficulty pronouncing words, difficulty finding words, asymmetry, or new sensation or motor deficits.  The history is provided by the patient. No language interpreter was used.  Fall Pertinent negatives include no chest pain, no abdominal pain and no shortness of breath.  Weakness Associated symptoms: no abdominal pain, no arthralgias, no chest pain, no cough, no dysuria, no fever, no seizures, no shortness of breath and no vomiting        Home Medications Prior to Admission medications   Medication Sig Start Date End Date Taking? Authorizing Provider  allopurinol (ZYLOPRIM) 100 MG tablet Take 100 mg by mouth daily. 10/09/21   [provider]  diphenhydramine-acetaminophen (TYLENOL PM) 25-500 MG TABS tablet Take 1 tablet by mouth at bedtime as needed (sleep).    [provider]  divalproex (DEPAKOTE) 250 MG DR tablet Take 250 mg by mouth 2 (two) times daily. 08/15/21   [provider]  donepezil (ARICEPT) 10 MG tablet Take 10 mg by mouth at bedtime.  Patient not  taking: Reported on 12/05/2021 02/13/18   [provider]  DULoxetine (CYMBALTA) 30 MG capsule Take 30 mg by mouth daily. 10/27/20   [provider]  ezetimibe (ZETIA) 10 MG tablet TAKE 1 TABLET BY MOUTH EVERY DAY Patient taking differently: Take 10 mg by mouth daily. 11/04/21   Lelon Perla, MD  finasteride (PROSCAR) 5 MG tablet Take 5 mg by mouth daily. 11/03/19   [provider]  fludrocortisone (FLORINEF) 0.1 MG tablet Take 1 tablet (0.1 mg total) by mouth daily. 11/10/21   Lavina Hamman, MD  levothyroxine (SYNTHROID) 112 MCG tablet Take 112 mcg by mouth daily. 10/09/19   [provider]  lidocaine (LIDODERM) 5 % Place 1 patch onto the skin daily. 10/19/21   [provider]  memantine (NAMENDA) 10 MG tablet Take 1 tablet (10 mg total) by mouth 2 (two) times daily. Patient taking differently: Take 10 mg by mouth at bedtime. 09/27/21 09/22/22  Alric Ran, MD  mirtazapine (REMERON) 15 MG tablet Take 15 mg by mouth at bedtime. 06/22/19   [provider]  nitroGLYCERIN (NITROSTAT) 0.4 MG SL tablet Place 1 tablet (0.4 mg total) under the tongue every 5 (five) minutes as needed. Patient taking differently: Place 0.4 mg under the tongue every 5 (five) minutes as needed for chest pain. 07/03/19   Burtis Junes, NP  oxyCODONE-acetaminophen (PERCOCET) 10-325 MG tablet Take 1 tablet by mouth 3 (three) times daily as needed for pain. 11/04/21   [provider]  pantoprazole (PROTONIX) 40  MG tablet Take 1 tablet (40 mg total) by mouth daily before breakfast. 03/25/19 11/07/21  Brahmbhatt, Orson Gear, MD  Polyethyl Glycol-Propyl Glycol (SYSTANE OP) Place 1 drop into both eyes 2 (two) times daily as needed (dry eyes).    [provider]  pregabalin (LYRICA) 75 MG capsule Take 1 capsule (75 mg total) by mouth in the morning AND 2 capsules (150 mg total) every evening. 11/24/21 06/22/22  Alric Ran, MD  sertraline (ZOLOFT) 100 MG tablet Take 100 mg  by mouth daily. 09/19/19   [provider]  tamsulosin (FLOMAX) 0.4 MG CAPS capsule Take 0.4 mg by mouth at bedtime as needed (when finasteride does not work). 10/27/21   [provider]  Vitamin D, Ergocalciferol, (DRISDOL) 50000 units CAPS capsule Take 50,000 Units by mouth every Monday. 07/31/17   [provider]      Allergies    Other, Pravachol, and Triazolam    Review of Systems   Review of Systems  Constitutional:  Negative for chills and fever.  HENT:  Negative for ear pain and sore throat.   Eyes:  Negative for pain and visual disturbance.  Respiratory:  Negative for cough and shortness of breath.   Cardiovascular:  Negative for chest pain and palpitations.  Gastrointestinal:  Negative for abdominal pain and vomiting.  Genitourinary:  Negative for dysuria and hematuria.  Musculoskeletal:  Negative for arthralgias and back pain.  Skin:  Negative for color change and rash.  Neurological:  Positive for weakness. Negative for seizures and syncope.  All other systems reviewed and are negative.   Physical Exam Updated Vital Signs BP (!) 135/114   Pulse 78   Temp 98.1 F (36.7 C) (Oral)   Resp 16   Ht 6' (1.829 m)   Wt 68.9 kg   SpO2 100%   BMI 20.61 kg/m  Physical Exam Vitals and nursing note reviewed.  Constitutional:      General: He is not in acute distress.    Appearance: He is well-developed.  HENT:     Head: Normocephalic and atraumatic.  Eyes:     Conjunctiva/sclera: Conjunctivae normal.  Cardiovascular:     Rate and Rhythm: Normal rate and regular rhythm.     Heart sounds: No murmur heard. Pulmonary:     Effort: Pulmonary effort is normal. No respiratory distress.     Breath sounds: Normal breath sounds.  Abdominal:     Palpations: Abdomen is soft.     Tenderness: There is no abdominal tenderness.  Musculoskeletal:        General: No swelling.     Cervical back: Neck supple.  Skin:    General: Skin is warm and dry.      Capillary Refill: Capillary refill takes more than 3 seconds.          Comments: Skin tenting   Neurological:     General: No focal deficit present.     Mental Status: He is alert and oriented to person, place, and time.     GCS: GCS eye subscore is 4. GCS verbal subscore is 5. GCS motor subscore is 6.     Cranial Nerves: Cranial nerves 2-12 are intact.     Sensory: Sensation is intact.     Motor: Motor function is intact.  Psychiatric:        Mood and Affect: Mood normal.     ED Results / Procedures / Treatments   Labs (all labs ordered are listed, but only abnormal results are displayed)  Labs Reviewed  CBC WITH DIFFERENTIAL/PLATELET - Abnormal; Notable for the following components:      Result Value   Platelets 106 (*)    Monocytes Absolute 2.4 (*)    Abs Immature Granulocytes 0.29 (*)    All other components within normal limits  COMPREHENSIVE METABOLIC PANEL - Abnormal; Notable for the following components:   Glucose, Bld 101 (*)    GFR, Estimated 56 (*)    All other components within normal limits  URINALYSIS, ROUTINE W REFLEX MICROSCOPIC - Abnormal; Notable for the following components:   Hgb urine dipstick TRACE (*)    All other components within normal limits  URINALYSIS, MICROSCOPIC (REFLEX) - Abnormal; Notable for the following components:   Bacteria, UA RARE (*)    All other components within normal limits  TSH  TROPONIN I (HIGH SENSITIVITY)  TROPONIN I (HIGH SENSITIVITY)    EKG EKG Interpretation  Date/Time:  Monday December 05 2021 16:26:41 EDT Ventricular Rate:  75 PR Interval:  181 QRS Duration: 105 QT Interval:  433 QTC Calculation: 484 R Axis:   -80 Text Interpretation: Sinus rhythm Probable left atrial enlargement Inferior infarct, old Consider anterior infarct Confirmed by Campbell Stall ([id number]) on 0/27/7412 5:52:42 PM  Radiology CT Head Wo Contrast  Result Date: 12/05/2021 CLINICAL DATA:  Head trauma, minor (Age >= 65y).  Fall weakness EXAM: CT  HEAD WITHOUT CONTRAST TECHNIQUE: Contiguous axial images were obtained from the base of the skull through the vertex without intravenous contrast. RADIATION DOSE REDUCTION: This exam was performed according to the departmental dose-optimization program which includes automated exposure control, adjustment of the mA and/or kV according to patient size and/or use of iterative reconstruction technique. COMPARISON:  None Available. BRAIN: BRAIN Cerebral ventricle sizes are concordant with the degree of cerebral volume loss. Patchy and confluent areas of decreased attenuation are noted throughout the deep and periventricular white matter of the cerebral hemispheres bilaterally, compatible with chronic microvascular ischemic disease. No evidence of large-territorial acute infarction. No definite parenchymal hemorrhage. Interval development of a vague 0.4 cm left frontal hyperdensity along the Usman Millett-white matter junction (4:32, 2:25). Trace foci of subarachnoid hemorrhage along the right frontal lobe not fully excluded (2:27, 4:41). Bilateral chronic hygromas. No mass effect or midline shift. No hydrocephalus. Basilar cisterns are patent. Vascular: No hyperdense vessel. Skull: No acute fracture or focal lesion. Sinuses/Orbits: Paranasal sinuses and mastoid air cells are clear. Bilateral lens replacement. Otherwise the orbits are unremarkable. Other: None. IMPRESSION: 1. Trace foci of subarachnoid hemorrhage along the right frontal lobe not fully excluded. Consider repeat CT head in 6 hours to evaluate for stability. 2. Interval development of an indeterminate vague 0.4 cm left frontal hyperdensity along the Vi Biddinger-white matter junction. Electronically Signed   By: Iven Finn M.D.   On: 12/05/2021 16:51    Procedures .Critical Care  Performed by: Lianne Cure, DO Authorized by: Lianne Cure, DO   Critical care provider statement:    Critical care time (minutes):  75   Critical care was necessary to treat or  prevent imminent or life-threatening deterioration of the following conditions: Brain bleed.   Critical care was time spent personally by me on the following activities:  Development of treatment plan with patient or surrogate, discussions with consultants, evaluation of patient's response to treatment, examination of patient, ordering and review of laboratory studies, ordering and review of radiographic studies, ordering and performing treatments and interventions, pulse oximetry, re-evaluation of patient's condition and review of old charts  Care discussed with: admitting provider     Care discussed with comment:  Admitting and neurosurgery     Medications Ordered in ED Medications  sodium chloride 0.9 % bolus 1,000 mL (0 mLs Intravenous Stopped 12/05/21 1806)    ED Course/ Medical Decision Making/ A&P                           Medical Decision Making Amount and/or Complexity of Data Reviewed Labs: ordered. Radiology: ordered.   49:19 PM  88 year old male with past medical history of memory disability, bilateral foot drop, and tremor presenting for fall.  Patient is alert and oriented x3, no acute distress, afebrile, stable vital signs.  NIH stroke scale 0.  CT head and neck demonstrates Clinical signs of dehydration including skin tenting and dry membranes.  IV fluids given.   CT head demonstrates right small frontal hemorrhage.  Hospitalization and discharged on 6/28 had right-sided frontal hemorrhage hypertensive emergency with blood pressure above 200.  Blood pressure stable currently at 135/114.  3 falls in the past week.  I spoke with neurosurgeon Dr. Annette Stable commends no neurosurgical interventions at this time but will be on as needed.  Recommends frequent neurochecks.  I spoke with Dr. Hal Hope who agrees to except patient admission.        Final Clinical Impression(s) / ED Diagnoses Final diagnoses:  Acute intra-cranial hemorrhage [facility])    Rx / DC Orders ED  Discharge Orders     None         Lianne Cure, DO [birth year]52[birth year]84 2019

## 2021-12-05 NOTE — ED Notes (Signed)
Pt. B/P con't to go up and slightly down but mostly up.  Pt. Is alert and baseline confused per his wife.  Pt. Did soil his pants with urine.  Pt. In no distress and able to speak clearly with a soft voice.

## 2021-12-05 NOTE — ED Notes (Signed)
Patient transported to CT 

## 2021-12-05 NOTE — ED Notes (Signed)
Pt. Is in no distress and has clear speech but does not know where he is.

## 2021-12-05 NOTE — ED Notes (Signed)
Pt. Wife is Tyland Klemens at [phone]

## 2021-12-06 ENCOUNTER — Telehealth: Payer: Self-pay | Admitting: *Deleted

## 2021-12-06 DIAGNOSIS — I629 Nontraumatic intracranial hemorrhage, unspecified: Secondary | ICD-10-CM

## 2021-12-06 DIAGNOSIS — R296 Repeated falls: Secondary | ICD-10-CM

## 2021-12-06 MED ORDER — ALLOPURINOL 100 MG PO TABS
100.0000 mg | ORAL_TABLET | Freq: Every day | ORAL | Status: DC
  Administered 2021-12-07 – 2021-12-09 (×3): 100 mg via ORAL
  Filled 2021-12-06 (×3): qty 1

## 2021-12-06 MED ORDER — HYDRALAZINE HCL 20 MG/ML IJ SOLN
5.0000 mg | INTRAMUSCULAR | Status: DC | PRN
  Administered 2021-12-06: 5 mg via INTRAVENOUS
  Filled 2021-12-06: qty 1

## 2021-12-06 MED ORDER — SERTRALINE HCL 100 MG PO TABS
100.0000 mg | ORAL_TABLET | Freq: Every day | ORAL | Status: DC
  Administered 2021-12-07 – 2021-12-08 (×2): 100 mg via ORAL
  Filled 2021-12-06 (×2): qty 1

## 2021-12-06 MED ORDER — DIVALPROEX SODIUM 250 MG PO DR TAB
250.0000 mg | DELAYED_RELEASE_TABLET | Freq: Two times a day (BID) | ORAL | Status: DC
Start: 2021-12-06 — End: 2021-12-06

## 2021-12-06 MED ORDER — DULOXETINE HCL 60 MG PO CPEP
60.0000 mg | ORAL_CAPSULE | Freq: Every day | ORAL | Status: DC
  Administered 2021-12-07 – 2021-12-08 (×2): 60 mg via ORAL
  Filled 2021-12-06 (×2): qty 1

## 2021-12-06 MED ORDER — LABETALOL HCL 5 MG/ML IV SOLN
10.0000 mg | Freq: Once | INTRAVENOUS | Status: AC
  Administered 2021-12-06: 10 mg via INTRAVENOUS
  Filled 2021-12-06: qty 4

## 2021-12-06 MED ORDER — ACETAMINOPHEN 325 MG PO TABS: 650.0000 mg | ORAL_TABLET | Freq: Four times a day (QID) | ORAL | Status: DC | PRN

## 2021-12-06 MED ORDER — LEVOTHYROXINE SODIUM 112 MCG PO TABS
112.0000 ug | ORAL_TABLET | Freq: Every day | ORAL | Status: DC
  Administered 2021-12-07 – 2021-12-09 (×3): 112 ug via ORAL
  Filled 2021-12-06 (×3): qty 1

## 2021-12-06 MED ORDER — SODIUM CHLORIDE 0.9 % IV SOLN: INTRAVENOUS | Status: DC

## 2021-12-06 MED ORDER — EZETIMIBE 10 MG PO TABS
10.0000 mg | ORAL_TABLET | Freq: Every day | ORAL | Status: DC
  Administered 2021-12-07 – 2021-12-09 (×3): 10 mg via ORAL
  Filled 2021-12-06 (×3): qty 1

## 2021-12-06 MED ORDER — VALPROATE SODIUM 100 MG/ML IV SOLN
250.0000 mg | Freq: Two times a day (BID) | INTRAVENOUS | Status: DC
  Administered 2021-12-07: 250 mg via INTRAVENOUS
  Filled 2021-12-06 (×3): qty 2.5

## 2021-12-06 MED ORDER — MEMANTINE HCL 10 MG PO TABS
10.0000 mg | ORAL_TABLET | Freq: Every day | ORAL | Status: DC
  Administered 2021-12-07 – 2021-12-08 (×3): 10 mg via ORAL
  Filled 2021-12-06 (×3): qty 1

## 2021-12-06 MED ORDER — LABETALOL HCL 5 MG/ML IV SOLN: 5.0000 mg | INTRAVENOUS | Status: DC | PRN

## 2021-12-06 MED ORDER — LIDOCAINE 5 % EX PTCH
1.0000 | MEDICATED_PATCH | CUTANEOUS | Status: DC
  Administered 2021-12-06 – 2021-12-09 (×4): 1 via TRANSDERMAL
  Filled 2021-12-06 (×4): qty 1

## 2021-12-06 MED ORDER — ACETAMINOPHEN 650 MG RE SUPP: 650.0000 mg | Freq: Four times a day (QID) | RECTAL | Status: DC | PRN

## 2021-12-06 MED ORDER — FLUDROCORTISONE ACETATE 0.1 MG PO TABS
0.1000 mg | ORAL_TABLET | Freq: Every day | ORAL | Status: DC
  Filled 2021-12-06: qty 1

## 2021-12-06 MED ORDER — PREGABALIN 50 MG PO CAPS
75.0000 mg | ORAL_CAPSULE | Freq: Two times a day (BID) | ORAL | Status: DC
  Administered 2021-12-07 – 2021-12-08 (×5): 75 mg via ORAL
  Filled 2021-12-06 (×5): qty 1

## 2021-12-06 MED ORDER — MIRTAZAPINE 15 MG PO TABS
15.0000 mg | ORAL_TABLET | Freq: Every day | ORAL | Status: DC
  Administered 2021-12-07 – 2021-12-08 (×3): 15 mg via ORAL
  Filled 2021-12-06 (×3): qty 1

## 2021-12-06 MED ORDER — PANTOPRAZOLE SODIUM 40 MG PO TBEC
40.0000 mg | DELAYED_RELEASE_TABLET | Freq: Every day | ORAL | Status: DC
  Administered 2021-12-07 – 2021-12-09 (×3): 40 mg via ORAL
  Filled 2021-12-06 (×3): qty 1

## 2021-12-06 MED ORDER — FINASTERIDE 5 MG PO TABS
5.0000 mg | ORAL_TABLET | Freq: Every day | ORAL | Status: DC
  Administered 2021-12-07 – 2021-12-09 (×3): 5 mg via ORAL
  Filled 2021-12-06 (×3): qty 1

## 2021-12-06 NOTE — ED Notes (Signed)
Pt on occasion tries to get off stretcher, Bathroom offered frequently , provided warm blankets. Condom Cath has been applied, sr x 2 up, door to room left open, pt instructed several times to remain on stretcher, call bell within easy reach. Constant observation in place. All implemented due to being a [place] client

## 2021-12-06 NOTE — ED Notes (Signed)
Cont to be resting quietly, cont to await for room assignment and tx to [facility]

## 2021-12-06 NOTE — ED Provider Notes (Signed)
Patient is here awaiting a bed at [facility].  He has been here for almost 24 hours.  On review of his chart, it appears he has a small intracranial hemorrhage status post a fall.  He had a recent intracranial hemorrhage 3 weeks ago.  I reviewed the neurosurgery notes and it did not appear that anything needed to be done surgically with the bleeds and a repeat CT scan was not recommended.  The wife is very upset that nothing has been done for the patient since yesterday.  I had a long discussion about this with the wife.  I will go ahead and start some maintenance IV fluids as he has not had much to eat or drink today.  His blood pressure is on the high side.  He is gotten some IV medications earlier today.  We will repeat a dose of labetalol.  At this point it does not seem reasonable to restart his home medications as he actually has a bed assignment at [facility] and is just awaiting transport.   Malvin Johns, MD 12/06/21 [phone]

## 2021-12-06 NOTE — ED Notes (Signed)
Pt. Is resting with no complaints at present time with wife at bedside.

## 2021-12-06 NOTE — ED Notes (Signed)
Phone Handoff Report provided to [facility] Transport Team 

## 2021-12-06 NOTE — Telephone Encounter (Signed)
Per 12/05/21 los - called and gave upcoming appointments - confirmed

## 2021-12-06 NOTE — ED Notes (Signed)
Phone Handoff Report provided to rec RN at [facility])

## 2021-12-06 NOTE — ED Notes (Signed)
Placed external catheter on patient  Warm blankets

## 2021-12-06 NOTE — H&P (Signed)
History and Physical    Patrick Rica H Barcelo DZH:[id number] DOB: 1926-12-17 DOA: 12/05/2021  PCP: Ginger Organ., MD  Patient coming from: [facility] ED  Chief Complaint: Falls, head injury  HPI: Patrick [name] is a 88 y.o. male with medical history significant of CAD, CKD stage IIIa, chronic thrombocytopenia, hypertension, hyperlipidemia, hypothyroidism, BPH, GERD, gout, chronic pain, dementia, depression.  Recently admitted 6/26-6/28 for small ICH in the setting of thrombocytopenia.  Imaging also revealed severe stenosis in the posterior circulation but not a candidate for antiplatelet agents due to [place].  He presented to the ED yesterday after injuring his head in the setting of multiple falls.  NIH stroke scale 0 on arrival to the ED.    CT head without contrast showing: "IMPRESSION: 1. Trace foci of subarachnoid hemorrhage along the right frontal lobe not fully excluded. Consider repeat CT head in 6 hours to evaluate for stability. 2. Interval development of an indeterminate vague 0.4 cm left frontal hyperdensity along the gray-white matter junction."  Neurosurgery did not feel that he needed surgical intervention and did not recommend repeating head CT.    He was felt to be dehydrated and was given IV fluids.  He was hypertensive with systolic as high as 419 and was given doses of IV labetalol.  Labs notable for WBC 7.8, hemoglobin 14.8, platelet count 106k (improved).  Sodium 139, potassium 3.9, chloride 102, bicarb 28, BUN 22, creatinine 1.2, glucose 101.  High-sensitivity troponin negative x2.  TSH normal.  UA not suggestive of infection.  History provided mostly by wife at bedside who states she is concerned that the patient has been falling because he is dehydrated.  States he is eating food but does not like drinking fluids and his urine has been dark in appearance.  Wife states he had 2 falls over the past week and hit his head during one of these falls.  No loss of consciousness  reported.  Patient states he keeps falling because he has neuropathy and feels unsteady when walking.  He denies dizziness.  Per wife, his speech was mildly slurred yesterday which she thinks is due to his mouth being too dry.  No focal weakness or numbness.  Patient has no other complaints.  Denies fevers, chills, cough, shortness of breath, chest pain, nausea, vomiting, abdominal pain, or diarrhea.  He does not take any antihypertensives or blood thinners.  Review of Systems:  Review of Systems  All other systems reviewed and are negative.   Past Medical History:  Diagnosis Date   Bilateral foot-drop 12/17/2019   BPH (benign prostatic hypertrophy)    Diverticulosis    ED (erectile dysfunction)    Gait abnormality 12/17/2019   GERD (gastroesophageal reflux disease)    Gout    Hepatitis    hx hepatitis after mono as teenager   History of kidney stones    History of skin cancer    Hyperlipidemia    Hypertension    Hypothyroidism    IHD (ischemic heart disease)    Remote CABG in 1997   Memory disorder 12/17/2019   MI, acute, non ST segment elevation ([place]) 2010   s/p cath with occluded SVG to PD that fills by collaterals and the remainder of his revasculsarization is satisfactory. "the first time they did the stent , the second time they did the graft 8 vessels"   Nocturia    Thrombocytopenia ([place]) 11/19/2018   Tremor, essential 12/17/2019   Urinary frequency     Past Surgical  History:  Procedure Laterality Date   APPENDECTOMY     BALLOON DILATION N/A 03/25/2019   Procedure: BALLOON DILATION;  Surgeon: Otis Brace, MD;  Location: [facility];  Service: Gastroenterology;  Laterality: N/A;   CARDIAC CATHETERIZATION  09/08/2008   NORMAL. EF 60%; Occluded SVG to PD that fills by collaterals and the remainder of his revascularization is satisfactory. he is managed medically.    CORONARY ARTERY BYPASS GRAFT  1997   CABG x 8   CORONARY STENT INTERVENTION N/A 11/18/2018   Procedure:  CORONARY STENT INTERVENTION;  Surgeon: Sherren Mocha, MD;  Location: [facility];  Service: Cardiovascular;  Laterality: N/A;   CYSTOSCOPY WITH INSERTION OF UROLIFT N/A 03/19/2015   Procedure: CYSTOSCOPY WITH INSERTION OF FOUR UROLIFTS;  Surgeon: Carolan Clines, MD;  Location: [facility];  Service: Urology;  Laterality: N/A;   ESOPHAGOGASTRODUODENOSCOPY (EGD) WITH PROPOFOL N/A 02/12/2017   Procedure: ESOPHAGOGASTRODUODENOSCOPY (EGD) WITH PROPOFOL;  Surgeon: Otis Brace, MD;  Location: [facility];  Service: Gastroenterology;  Laterality: N/A;   ESOPHAGOGASTRODUODENOSCOPY (EGD) WITH PROPOFOL N/A 07/13/2018   Procedure: ESOPHAGOGASTRODUODENOSCOPY (EGD) WITH PROPOFOL;  Surgeon: Clarene Essex, MD;  Location: [facility];  Service: Endoscopy;  Laterality: N/A;   ESOPHAGOGASTRODUODENOSCOPY (EGD) WITH PROPOFOL N/A 03/25/2019   Procedure: ESOPHAGOGASTRODUODENOSCOPY (EGD) WITH PROPOFOL;  Surgeon: Otis Brace, MD;  Location: [facility];  Service: Gastroenterology;  Laterality: N/A;   ESOPHAGOGASTRODUODENOSCOPY (EGD) WITH PROPOFOL N/A 06/28/2021   Procedure: ESOPHAGOGASTRODUODENOSCOPY (EGD) WITH PROPOFOL;  Surgeon: Wilford Corner, MD;  Location: [facility];  Service: Endoscopy;  Laterality: N/A;   FOREIGN BODY REMOVAL N/A 07/13/2018   Procedure: FOREIGN BODY REMOVAL;  Surgeon: Clarene Essex, MD;  Location: [facility];  Service: Endoscopy;  Laterality: N/A;   IMPACTION REMOVAL  06/28/2021   Procedure: IMPACTION REMOVAL;  Surgeon: Wilford Corner, MD;  Location: [facility];  Service: Endoscopy;;   LEFT HEART CATH AND CORS/GRAFTS ANGIOGRAPHY N/A 11/18/2018   Procedure: LEFT HEART CATH AND CORS/GRAFTS ANGIOGRAPHY;  Surgeon: Sherren Mocha, MD;  Location: [facility];  Service: Cardiovascular;  Laterality: N/A;     reports that he quit smoking about 63 years ago. His smoking use included cigarettes. He has a 15.00 pack-year smoking history. He has never used smokeless tobacco. He  reports current alcohol use. He reports that he does not use drugs.  Allergies  Allergen Reactions   Pravachol Other (See Comments)    Unknown   Triazolam Other (See Comments)    "Makes me crazy"    Family History  Problem Relation Age of Onset   Heart failure Mother 96    Prior to Admission medications   Medication Sig Start Date End Date Taking? Authorizing Provider  allopurinol (ZYLOPRIM) 100 MG tablet Take 100 mg by mouth daily. 10/09/21  Yes [provider]  diphenhydramine-acetaminophen (TYLENOL PM) 25-500 MG TABS tablet Take 1 tablet by mouth at bedtime as needed (sleep).   Yes [provider]  divalproex (DEPAKOTE) 250 MG DR tablet Take 250 mg by mouth 2 (two) times daily. 08/15/21  Yes [provider]  DULoxetine (CYMBALTA) 60 MG capsule Take 60 mg by mouth daily. 11/14/21  Yes [provider]  ezetimibe (ZETIA) 10 MG tablet TAKE 1 TABLET BY MOUTH EVERY DAY Patient taking differently: Take 10 mg by mouth daily. 11/04/21  Yes Lelon Perla, MD  finasteride (PROSCAR) 5 MG tablet Take 5 mg by mouth daily. 11/03/19  Yes [provider]  fludrocortisone (FLORINEF) 0.1 MG tablet Take 1 tablet (0.1 mg total)  by mouth daily. 11/10/21  Yes Lavina Hamman, MD  levothyroxine (SYNTHROID) 112 MCG tablet Take 112 mcg by mouth daily. 10/09/19  Yes [provider]  lidocaine (LIDODERM) 5 % Place 1 patch onto the skin daily. 10/19/21  Yes [provider]  memantine (NAMENDA) 10 MG tablet Take 1 tablet (10 mg total) by mouth 2 (two) times daily. Patient taking differently: Take 10 mg by mouth at bedtime. 09/27/21 09/22/22 Yes Camara, Maryan Puls, MD  mirtazapine (REMERON) 15 MG tablet Take 15 mg by mouth at bedtime. 06/22/19  Yes [provider]  nitroGLYCERIN (NITROSTAT) 0.4 MG SL tablet Place 1 tablet (0.4 mg total) under the tongue every 5 (five) minutes as needed. Patient taking differently: Place 0.4 mg under the tongue every 5  (five) minutes as needed for chest pain. 07/03/19  Yes Burtis Junes, NP  oxyCODONE-acetaminophen (PERCOCET) 10-325 MG tablet Take 1 tablet by mouth 3 (three) times daily as needed for pain. 11/04/21  Yes [provider]  pantoprazole (PROTONIX) 40 MG tablet Take 1 tablet (40 mg total) by mouth daily before breakfast. 03/25/19 12/06/21 Yes Brahmbhatt, Parag, MD  Polyethyl Glycol-Propyl Glycol (SYSTANE OP) Place 1 drop into both eyes 2 (two) times daily as needed (dry eyes).   Yes [provider]  pregabalin (LYRICA) 75 MG capsule Take 1 capsule (75 mg total) by mouth in the morning AND 2 capsules (150 mg total) every evening. Patient taking differently: Take 1 capsule (75 mg total) by mouth in the morning and evening. 11/24/21 06/22/22 Yes Alric Ran, MD  sertraline (ZOLOFT) 100 MG tablet Take 100 mg by mouth daily. 09/19/19  Yes [provider]  tamsulosin (FLOMAX) 0.4 MG CAPS capsule Take 0.4 mg by mouth at bedtime as needed (when finasteride does not work). 10/27/21  Yes [provider]  Vitamin D, Ergocalciferol, (DRISDOL) 50000 units CAPS capsule Take 50,000 Units by mouth every Monday. 07/31/17  Yes [provider]    Physical Exam: Vitals:   12/06/21 1600 12/06/21 1630 12/06/21 1800 12/06/21 2001  BP: (!) 187/82 (!) 200/83 (!) 160/91 (!) 179/79  Pulse: 82 79 66 66  Resp: 20 (!) '21 15 16  '$ Temp: 98.3 F (36.8 C)   98.3 F (36.8 C)  TempSrc:    Oral  SpO2: 97% 99% 98% 100%  Weight:      Height:        Physical Exam Vitals reviewed.  Constitutional:      General: He is not in acute distress. HENT:     Head: Normocephalic and atraumatic.  Eyes:     Extraocular Movements: Extraocular movements intact.     Comments: Pupils asymmetric in size.  Right pupil is smaller with very sluggish response to light.  Cardiovascular:     Rate and Rhythm: Normal rate and regular rhythm.     Pulses: Normal pulses.  Pulmonary:     Effort: Pulmonary  effort is normal. No respiratory distress.     Breath sounds: Normal breath sounds. No wheezing or rales.  Abdominal:     General: Bowel sounds are normal. There is no distension.     Palpations: Abdomen is soft.     Tenderness: There is no abdominal tenderness.  Musculoskeletal:        General: No swelling or tenderness.     Cervical back: Normal range of motion.  Skin:    General: Skin is warm and dry.  Neurological:     Mental Status: He is alert and  oriented to person, place, and time.     Comments: Speech fluent No facial droop Strength 5 out of 5 in bilateral upper and lower extremities.  Sensation to light touch intact throughout.      Labs on Admission: I have personally reviewed following labs and imaging studies  CBC: Recent Labs  Lab 12/05/21 1425 12/05/21 1630  WBC 7.3 7.8  NEUTROABS 3.8 3.9  HGB 13.5 14.8  HCT 41.8 44.8  MCV 98.1 98.0  PLT 100* 956*   Basic Metabolic Panel: Recent Labs  Lab 12/05/21 1425 12/05/21 1630  NA 139 139  K 3.8 3.9  CL 102 102  CO2 29 28  GLUCOSE 100* 101*  BUN 23 22  CREATININE 1.33* 1.22  CALCIUM 9.3 9.1   GFR: Estimated Creatinine Clearance: 37.7 mL/min (by C-G formula based on SCr of 1.22 mg/dL). Liver Function Tests: Recent Labs  Lab 12/05/21 1425 12/05/21 1630  AST 17 22  ALT 15 21  ALKPHOS 57 65  BILITOT 0.7 0.8  PROT 7.0 8.0  ALBUMIN 4.7 4.7   No results for input(s): "LIPASE", "AMYLASE" in the last 168 hours. No results for input(s): "AMMONIA" in the last 168 hours. Coagulation Profile: No results for input(s): "INR", "PROTIME" in the last 168 hours. Cardiac Enzymes: No results for input(s): "CKTOTAL", "CKMB", "CKMBINDEX", "TROPONINI" in the last 168 hours. BNP (last 3 results) No results for input(s): "PROBNP" in the last 8760 hours. HbA1C: No results for input(s): "HGBA1C" in the last 72 hours. CBG: No results for input(s): "GLUCAP" in the last 168 hours. Lipid Profile: No results for input(s):  "CHOL", "HDL", "LDLCALC", "TRIG", "CHOLHDL", "LDLDIRECT" in the last 72 hours. Thyroid Function Tests: Recent Labs    12/05/21 1630  TSH 0.428   Anemia Panel: No results for input(s): "VITAMINB12", "FOLATE", "FERRITIN", "TIBC", "IRON", "RETICCTPCT" in the last 72 hours. Urine analysis:    Component Value Date/Time   COLORURINE YELLOW 12/05/2021 1847   APPEARANCEUR CLEAR 12/05/2021 1847   LABSPEC 1.025 12/05/2021 1847   PHURINE 5.5 12/05/2021 1847   GLUCOSEU NEGATIVE 12/05/2021 1847   HGBUR TRACE (A) 12/05/2021 1847   BILIRUBINUR NEGATIVE 12/05/2021 1847   KETONESUR NEGATIVE 12/05/2021 1847   PROTEINUR NEGATIVE 12/05/2021 1847   UROBILINOGEN 1.0 03/20/2015 1813   NITRITE NEGATIVE 12/05/2021 1847   LEUKOCYTESUR NEGATIVE 12/05/2021 1847    Radiological Exams on Admission: I have personally reviewed images CT Head Wo Contrast  Result Date: 12/05/2021 CLINICAL DATA:  Head trauma, minor (Age >= 65y).  Fall weakness EXAM: CT HEAD WITHOUT CONTRAST TECHNIQUE: Contiguous axial images were obtained from the base of the skull through the vertex without intravenous contrast. RADIATION DOSE REDUCTION: This exam was performed according to the departmental dose-optimization program which includes automated exposure control, adjustment of the mA and/or kV according to patient size and/or use of iterative reconstruction technique. COMPARISON:  None Available. BRAIN: BRAIN Cerebral ventricle sizes are concordant with the degree of cerebral volume loss. Patchy and confluent areas of decreased attenuation are noted throughout the deep and periventricular white matter of the cerebral hemispheres bilaterally, compatible with chronic microvascular ischemic disease. No evidence of large-territorial acute infarction. No definite parenchymal hemorrhage. Interval development of a vague 0.4 cm left frontal hyperdensity along the gray-white matter junction (4:32, 2:25). Trace foci of subarachnoid hemorrhage along the  right frontal lobe not fully excluded (2:27, 4:41). Bilateral chronic hygromas. No mass effect or midline shift. No hydrocephalus. Basilar cisterns are patent. Vascular: No hyperdense vessel. Skull: No acute fracture  or focal lesion. Sinuses/Orbits: Paranasal sinuses and mastoid air cells are clear. Bilateral lens replacement. Otherwise the orbits are unremarkable. Other: None. IMPRESSION: 1. Trace foci of subarachnoid hemorrhage along the right frontal lobe not fully excluded. Consider repeat CT head in 6 hours to evaluate for stability. 2. Interval development of an indeterminate vague 0.4 cm left frontal hyperdensity along the gray-white matter junction. Electronically Signed   By: Iven Finn M.D.   On: 12/05/2021 16:51    EKG: Independently reviewed.  Sinus rhythm, no acute changes.  Assessment and Plan  Subarachnoid hemorrhage in the setting of chronic thrombocytopenia and recent falls CT done yesterday in the ED showing trace foci of subarachnoid hemorrhage along the right frontal lobe and interval development of an indeterminate vague 0.4 cm left frontal hyperdensity along the gray-white matter junction.  Neurosurgery was consulted at that time and felt that surgical intervention was not needed and did not recommend repeating head CT.  Patient was transferred to [place] and I appreciated anisocoria on exam but otherwise patient is AAO x4 and has no other neurodeficits.  Discussed with Dr. Kathyrn Sheriff, he recommends not repeating CT head as he feels that the patient is not a surgical candidate given his age and comorbidities.  He recommends discharging the patient in the morning if his blood pressure remains stable. -Continue neurochecks -Seizure precautions -Continue home medication Depakote -No anticoagulation or antiplatelet agents  Hypertensive urgency Does not take any antihypertensives at home due to history of orthostatic hypotension.  Blood pressure elevated in the  ED with systolic as high as 419 and was given doses of IV labetalol. -Continue IV meds PRN.  Discussed with neurosurgery, recommending keeping SBP <160. -Monitor blood pressure closely  Frequent falls He is not on antihypertensive due to history of orthostatic hypotension.  Patient denies any dizziness but does have peripheral neuropathy which could be contributing. -PT/OT eval -Fall precautions  CAD Not endorsing anginal symptoms and ACS less likely as troponin negative x2. -Not on aspirin due to [place] -Continue Zetia  CKD stage IIIa Renal function stable.  Chronic thrombocytopenia Platelet count 106k, appears improved. -Continue to monitor  Hypothyroidism -Continue Synthroid  BPH -Continue finasteride  GERD -Continue Protonix  Gout -Continue allopurinol  Dementia -Continue Namenda  Depression -Continue sertraline mirtazapine, duloxetine  Peripheral neuropathy -Continue Lyrica  DVT prophylaxis: SCDs Code Status: Full Code (discussed with the patient) Family Communication: Wife at bedside. Admission status: It is my clinical opinion that referral for OBSERVATION is reasonable and necessary in this patient based on the above information provided. The aforementioned taken together are felt to place the patient at high risk for further clinical deterioration. However, it is anticipated that the patient may be medically stable for discharge from the hospital within 24 to 48 hours.   Shela Leff MD [facility]  If 7PM-7AM, please contact night-coverage [email or web address]  12/06/2021, 9:07 PM

## 2021-12-07 DIAGNOSIS — S066XAS Traumatic subarachnoid hemorrhage with loss of consciousness status unknown, sequela: Secondary | ICD-10-CM | POA: Diagnosis present

## 2021-12-07 DIAGNOSIS — Z7189 Other specified counseling: Secondary | ICD-10-CM | POA: Diagnosis not present

## 2021-12-07 DIAGNOSIS — F0393 Unspecified dementia, unspecified severity, with mood disturbance: Secondary | ICD-10-CM | POA: Diagnosis present

## 2021-12-07 DIAGNOSIS — E039 Hypothyroidism, unspecified: Secondary | ICD-10-CM | POA: Diagnosis present

## 2021-12-07 DIAGNOSIS — W19XXXA Unspecified fall, initial encounter: Secondary | ICD-10-CM | POA: Diagnosis present

## 2021-12-07 DIAGNOSIS — M21372 Foot drop, left foot: Secondary | ICD-10-CM | POA: Diagnosis present

## 2021-12-07 DIAGNOSIS — I129 Hypertensive chronic kidney disease with stage 1 through stage 4 chronic kidney disease, or unspecified chronic kidney disease: Secondary | ICD-10-CM | POA: Diagnosis present

## 2021-12-07 DIAGNOSIS — G8929 Other chronic pain: Secondary | ICD-10-CM | POA: Diagnosis present

## 2021-12-07 DIAGNOSIS — W19XXXD Unspecified fall, subsequent encounter: Secondary | ICD-10-CM | POA: Diagnosis present

## 2021-12-07 DIAGNOSIS — Z681 Body mass index (BMI) 19 or less, adult: Secondary | ICD-10-CM | POA: Diagnosis not present

## 2021-12-07 DIAGNOSIS — R251 Tremor, unspecified: Secondary | ICD-10-CM | POA: Diagnosis present

## 2021-12-07 DIAGNOSIS — R300 Dysuria: Secondary | ICD-10-CM | POA: Diagnosis present

## 2021-12-07 DIAGNOSIS — E860 Dehydration: Secondary | ICD-10-CM | POA: Diagnosis present

## 2021-12-07 DIAGNOSIS — I160 Hypertensive urgency: Secondary | ICD-10-CM | POA: Diagnosis present

## 2021-12-07 DIAGNOSIS — K5903 Drug induced constipation: Secondary | ICD-10-CM | POA: Diagnosis not present

## 2021-12-07 DIAGNOSIS — G4700 Insomnia, unspecified: Secondary | ICD-10-CM | POA: Diagnosis not present

## 2021-12-07 DIAGNOSIS — R7989 Other specified abnormal findings of blood chemistry: Secondary | ICD-10-CM | POA: Diagnosis not present

## 2021-12-07 DIAGNOSIS — F03918 Unspecified dementia, unspecified severity, with other behavioral disturbance: Secondary | ICD-10-CM | POA: Diagnosis present

## 2021-12-07 DIAGNOSIS — S066X9S Traumatic subarachnoid hemorrhage with loss of consciousness of unspecified duration, sequela: Secondary | ICD-10-CM | POA: Diagnosis not present

## 2021-12-07 DIAGNOSIS — Z515 Encounter for palliative care: Secondary | ICD-10-CM | POA: Diagnosis not present

## 2021-12-07 DIAGNOSIS — Z79890 Hormone replacement therapy (postmenopausal): Secondary | ICD-10-CM | POA: Diagnosis not present

## 2021-12-07 DIAGNOSIS — G629 Polyneuropathy, unspecified: Secondary | ICD-10-CM | POA: Diagnosis present

## 2021-12-07 DIAGNOSIS — S066X0A Traumatic subarachnoid hemorrhage without loss of consciousness, initial encounter: Secondary | ICD-10-CM | POA: Diagnosis present

## 2021-12-07 DIAGNOSIS — E785 Hyperlipidemia, unspecified: Secondary | ICD-10-CM | POA: Diagnosis present

## 2021-12-07 DIAGNOSIS — M109 Gout, unspecified: Secondary | ICD-10-CM | POA: Diagnosis present

## 2021-12-07 DIAGNOSIS — M545 Low back pain, unspecified: Secondary | ICD-10-CM | POA: Diagnosis present

## 2021-12-07 DIAGNOSIS — E538 Deficiency of other specified B group vitamins: Secondary | ICD-10-CM | POA: Diagnosis present

## 2021-12-07 DIAGNOSIS — I629 Nontraumatic intracranial hemorrhage, unspecified: Secondary | ICD-10-CM | POA: Diagnosis present

## 2021-12-07 DIAGNOSIS — G9608 Other cranial cerebrospinal fluid leak: Secondary | ICD-10-CM | POA: Diagnosis present

## 2021-12-07 DIAGNOSIS — I252 Old myocardial infarction: Secondary | ICD-10-CM | POA: Diagnosis not present

## 2021-12-07 DIAGNOSIS — R4781 Slurred speech: Secondary | ICD-10-CM | POA: Diagnosis present

## 2021-12-07 DIAGNOSIS — Z79899 Other long term (current) drug therapy: Secondary | ICD-10-CM | POA: Diagnosis not present

## 2021-12-07 DIAGNOSIS — N1831 Chronic kidney disease, stage 3a: Secondary | ICD-10-CM | POA: Diagnosis present

## 2021-12-07 DIAGNOSIS — N390 Urinary tract infection, site not specified: Secondary | ICD-10-CM | POA: Diagnosis present

## 2021-12-07 DIAGNOSIS — D693 Immune thrombocytopenic purpura: Secondary | ICD-10-CM | POA: Diagnosis present

## 2021-12-07 DIAGNOSIS — F0390 Unspecified dementia without behavioral disturbance: Secondary | ICD-10-CM | POA: Diagnosis present

## 2021-12-07 DIAGNOSIS — I10 Essential (primary) hypertension: Secondary | ICD-10-CM | POA: Diagnosis not present

## 2021-12-07 DIAGNOSIS — D696 Thrombocytopenia, unspecified: Secondary | ICD-10-CM | POA: Diagnosis present

## 2021-12-07 DIAGNOSIS — N189 Chronic kidney disease, unspecified: Secondary | ICD-10-CM | POA: Diagnosis not present

## 2021-12-07 DIAGNOSIS — N179 Acute kidney failure, unspecified: Secondary | ICD-10-CM | POA: Diagnosis present

## 2021-12-07 DIAGNOSIS — Y92019 Unspecified place in single-family (private) house as the place of occurrence of the external cause: Secondary | ICD-10-CM | POA: Diagnosis not present

## 2021-12-07 DIAGNOSIS — F411 Generalized anxiety disorder: Secondary | ICD-10-CM | POA: Diagnosis not present

## 2021-12-07 DIAGNOSIS — I2510 Atherosclerotic heart disease of native coronary artery without angina pectoris: Secondary | ICD-10-CM | POA: Diagnosis present

## 2021-12-07 DIAGNOSIS — R3915 Urgency of urination: Secondary | ICD-10-CM | POA: Diagnosis not present

## 2021-12-07 DIAGNOSIS — Z951 Presence of aortocoronary bypass graft: Secondary | ICD-10-CM | POA: Diagnosis not present

## 2021-12-07 DIAGNOSIS — B379 Candidiasis, unspecified: Secondary | ICD-10-CM | POA: Diagnosis not present

## 2021-12-07 DIAGNOSIS — H5461 Unqualified visual loss, right eye, normal vision left eye: Secondary | ICD-10-CM | POA: Diagnosis present

## 2021-12-07 DIAGNOSIS — N400 Enlarged prostate without lower urinary tract symptoms: Secondary | ICD-10-CM | POA: Diagnosis present

## 2021-12-07 DIAGNOSIS — R296 Repeated falls: Secondary | ICD-10-CM | POA: Diagnosis present

## 2021-12-07 DIAGNOSIS — S066X9D Traumatic subarachnoid hemorrhage with loss of consciousness of unspecified duration, subsequent encounter: Secondary | ICD-10-CM | POA: Diagnosis not present

## 2021-12-07 DIAGNOSIS — S065XAA Traumatic subdural hemorrhage with loss of consciousness status unknown, initial encounter: Secondary | ICD-10-CM | POA: Diagnosis not present

## 2021-12-07 DIAGNOSIS — I951 Orthostatic hypotension: Secondary | ICD-10-CM | POA: Diagnosis present

## 2021-12-07 DIAGNOSIS — K219 Gastro-esophageal reflux disease without esophagitis: Secondary | ICD-10-CM | POA: Diagnosis present

## 2021-12-07 DIAGNOSIS — R000 Tachycardia, unspecified: Secondary | ICD-10-CM | POA: Diagnosis not present

## 2021-12-07 DIAGNOSIS — E440 Moderate protein-calorie malnutrition: Secondary | ICD-10-CM | POA: Diagnosis present

## 2021-12-07 DIAGNOSIS — M21371 Foot drop, right foot: Secondary | ICD-10-CM | POA: Diagnosis present

## 2021-12-07 LAB — CBC
HCT: 41 % (ref 39.0–52.0)
Hemoglobin: 13.7 g/dL (ref 13.0–17.0)
MCH: 32 pg (ref 26.0–34.0)
MCHC: 33.4 g/dL (ref 30.0–36.0)
MCV: 95.8 fL (ref 80.0–100.0)
Platelets: 92 10*3/uL — ABNORMAL LOW (ref 150–400)
RBC: 4.28 MIL/uL (ref 4.22–5.81)
RDW: 13.5 % (ref 11.5–15.5)
WBC: 7.6 10*3/uL (ref 4.0–10.5)
nRBC: 0 % (ref 0.0–0.2)

## 2021-12-07 MED ORDER — HYDRALAZINE HCL 20 MG/ML IJ SOLN
10.0000 mg | INTRAMUSCULAR | Status: DC | PRN
  Administered 2021-12-07 – 2021-12-09 (×2): 10 mg via INTRAVENOUS
  Filled 2021-12-07 (×2): qty 1

## 2021-12-07 MED ORDER — HYDRALAZINE HCL 20 MG/ML IJ SOLN: 10.0000 mg | INTRAMUSCULAR | Status: DC | PRN

## 2021-12-07 MED ORDER — LABETALOL HCL 5 MG/ML IV SOLN
10.0000 mg | INTRAVENOUS | Status: DC | PRN
  Administered 2021-12-07: 10 mg via INTRAVENOUS
  Filled 2021-12-07: qty 4

## 2021-12-07 MED ORDER — DIVALPROEX SODIUM 250 MG PO DR TAB
250.0000 mg | DELAYED_RELEASE_TABLET | Freq: Two times a day (BID) | ORAL | Status: DC
  Administered 2021-12-07 – 2021-12-09 (×5): 250 mg via ORAL
  Filled 2021-12-07 (×5): qty 1

## 2021-12-07 MED ORDER — HYDRALAZINE HCL 20 MG/ML IJ SOLN: 10.0000 mg | Freq: Four times a day (QID) | INTRAMUSCULAR | Status: DC | PRN

## 2021-12-07 MED ORDER — LABETALOL HCL 5 MG/ML IV SOLN: 10.0000 mg | INTRAVENOUS | Status: DC | PRN

## 2021-12-07 NOTE — Progress Notes (Signed)
Occupational Therapy Evaluation Patient Details Name: Patrick [name] MRN: [id number] DOB: [birth year] Today's Date: 12/07/2021   History of Present Illness Pt is a 88 y.o. male transported to [facility] ED 03/08/2022 from outside hospital due to several recent falls and reports of hitting head during fall. CT revealed trace foci of subarachnoid hemorrhage along R frontal lobe not fully extended and interval development of an intermediate vague .4 cm L frontal hyperdensoty along gray-white matter junction.  Pt with recent admission 6/26 -6/28 with intracranial hemorrhage s/p a fall.  PMH significant for CAD, CKD stage IIIa, chronic thrombocytopenia, hypertension, hyperlipidemia, hypothyroidism, BPH, GERD, gout, chronic pain, dementia, depression.   Clinical Impression   PTA, pt lived with his wife who reports he was independent with ADL and IADL, but not driving.  Currently, pt is limited by orthostatic hypotension and decreased coordination, balance, cognition, and peripheral vision. Currently, pt performing LB ADL with mod-max A due to inability to recognize/manage drop in BP upon standing or when reaching down to feet. Pt performing UB dressing with min A during session and grooming with min guard. Performing one sit<>stand transfer during session, with significant drop in BP and requiring max verbal and tactile cues to return to seated position with total A to return to supine. Anticipate significant improvement as BP stabilizes. Pt would benefit from continued OT services at [facility] to optimize functional independence in ADL and IADL. Will continue to follow acutely as admitted.    BP: Supine 153/68 (91); sit 112/78(86); stand 85/50 (60) pt with decrease ability to follow commands becoming pale in color; supine 157/74 (98) after 1 minute   Recommendations for follow up therapy are one component of a multi-disciplinary discharge planning process, led by the attending physician.  Recommendations may be updated  based on patient status, additional functional criteria and insurance authorization.   Follow Up Recommendations  Acute inpatient rehab (3hours/day)    Assistance Recommended at Discharge Frequent or constant Supervision/Assistance  Patient can return home with the following A lot of help with walking and/or transfers;A lot of help with bathing/dressing/bathroom;Assistance with cooking/housework;Assist for transportation;Direct supervision/assist for financial management;Direct supervision/assist for medications management;Help with stairs or ramp for entrance    Functional Status Assessment  Patient has had a recent decline in their functional status and demonstrates the ability to make significant improvements in function in a reasonable and predictable amount of time.  Equipment Recommendations  None recommended by OT    Recommendations for Other Services       Precautions / Restrictions Precautions Precautions: Fall Precaution Comments: monitor BP Restrictions Weight Bearing Restrictions: No      Mobility Bed Mobility Overal bed mobility: Needs Assistance Bed Mobility: Rolling, Sidelying to Sit, Sit to Supine Rolling: Supervision, Min assist (increased time; min A following decreased in BP and for cleaning up in bed.) Sidelying to sit: Min guard (Increaed time. Min guard A for safety)   Sit to supine: Total assist (drop in BP total assist back to supine for pt safety)        Transfers Overall transfer level: Needs assistance Equipment used: Rolling walker (2 wheels) Transfers: Sit to/from Stand Sit to Stand: Min guard           General transfer comment: sit to stand with min guard A      Balance Overall balance assessment: Needs assistance Sitting-balance support: Single extremity supported, Bilateral upper extremity supported, No upper extremity supported Sitting balance-Leahy Scale: Fair Sitting balance - Comments: Initially maintaining balance without  support, however, after doffing socks, up to min A to maintain sitting balance.   Standing balance support: Bilateral upper extremity supported Standing balance-Leahy Scale: Poor Standing balance comment: Pt with R lateral lean into therapist after standing ~30 seconds, and decreased ability to follow commands with orthostatic BPs                           ADL either performed or assessed with clinical judgement   ADL Overall ADL's : Needs assistance/impaired     Grooming: Set up;Min guard;Sitting Grooming Details (indicate cue type and reason): Min guard A for safety due to inconsistencies in BP Upper Body Bathing: Min guard;Sitting   Lower Body Bathing: Sit to/from stand;Moderate assistance   Upper Body Dressing : Minimal assistance;Sitting   Lower Body Dressing: Sit to/from stand;Moderate assistance Lower Body Dressing Details (indicate cue type and reason): Able to doff socks during session, but assistabce required for donning new hospital socks (associated with drop in BP after reaching down). Toilet Transfer: Min guard (Sit to stand in preparation for participation in ADL)   [place] and Hygiene: Total assistance;Bed level;Cueing for safety;Cueing for sequencing Toileting - Clothing Manipulation Details (indicate cue type and reason): Pt on wet sheet on arrival. Bed mobility after BP drop to clean up. Tub/ Shower Transfer:  (defer this session. unable to progress mobility for safety)   Functional mobility during ADLs: Min guard;Rolling walker (2 wheels) (sit to stand this session and BP dropping. Max verbal and tactile cues to return to sitting EOB) General ADL Comments: Pt requiring min A for UB ADL and mod A for LB ADL     Vision Patient Visual Report:  (Pt reports no change in vision, however, with difficulty with visial testing. See below`) Vision Assessment?: Vision impaired- to be further tested in functional context;Yes Eye Alignment:  Within Functional Limits Ocular Range of Motion: Other (comment) (Pt with skill to scan in all quadrants, however, short visual attention span, and notably more attempts for compensatory texhniques in L lower quadrant and R upper quadrant. Consistent x2 attempts.) Alignment/Gaze Preference: Within Defined Limits Tracking/Visual Pursuits: Impaired - to be further tested in functional context (Pt with poor visual attention to perform visual tracking requiring max verbal cues for continuing to track. Easily frustrated during task despite encouragement, reporting that therapist is trying to trick him (although pleasant demeanor overall).) Saccades: Undershoots Visual Fields:  (Pt with narrow field of view in all quadrants without compensatory head turn.) Diplopia Assessment:  (Denies double vision.) Depth Perception: Undershoots Additional Comments: To be futther tested during functional ADL     Perception     Praxis      Pertinent Vitals/Pain Pain Assessment Pain Assessment: Faces Faces Pain Scale: Hurts a little bit Pain Location: General unwell feeling Pain Descriptors / Indicators: Discomfort Pain Intervention(s): Limited activity within patient's tolerance, Monitored during session, Relaxation, Repositioned     Hand Dominance Right   Extremity/Trunk Assessment Upper Extremity Assessment Upper Extremity Assessment: Generalized weakness;RUE deficits/detail;LUE deficits/detail (Pt with 5/5 strength BUE, however, fatiguing quickly. Essential tremor at baseline.) RUE Deficits / Details: Pt with decreased fine motor coordination R>L. Poor motor planning.organization during finger opposition. Difficulty with proprioception during finger to nose testing and finger to therapist's finger. Pt with difficulty performing finger to nose and then therapists finger. Pt observed with undershooting consistently. RUE Coordination: decreased fine motor LUE Deficits / Details: Pt with decreased fine motor  coordination R>L. Poor motor  planning.organization during consecutive finger opposition. Difficulty with proprioception during finger to nose testing and finger to therapist's finger. Pt with difficulty performing finger to nose and then therapists finger. Pt observed with undershooting consistently.   Lower Extremity Assessment Lower Extremity Assessment: Defer to PT evaluation       Communication Communication Communication: No difficulties   Cognition Arousal/Alertness: Awake/alert Behavior During Therapy: WFL for tasks assessed/performed (Initially flat, however, with greater facial expression and conversation throughout session.) Overall Cognitive Status: History of cognitive impairments - at baseline                                 General Comments: Pt's wife reports his ability to think and reason is near his baseline. Increaesd time to follow commands/process information, decreased STM, problem solving, sequencing. Pt with decreased ability to follow commands, exacerbated just before drops in BP during session with postural changes. Pleasant throughout, but with min verbalizations. Documented deficits in memory at baseline. Pt denying feeling poor or being dizzy throughout session, and reporting no break needed, however, requriing additional VCs and assist with drop in BP.     General Comments  BP: Supine 153/68 (91); sit 112/78(86); stand 85/50 (60) pt with decrease ability to follow commands becoming pale in color; supine 157/74 (98) after 1 minute    Exercises     Shoulder Instructions      Home Living Family/patient expects to be discharged to:: Private residence Living Arrangements: Spouse/significant other Available Help at Discharge: Family;Available 24 hours/day (Spouse reports they are always together) Type of Home: House Home Access: Stairs to enter [employer] of Steps: 2 Entrance Stairs-Rails: None Home Layout: Two level;Able to live on  main level with bedroom/bathroom;Full bath on main level Alternate Level Stairs-Number of Steps: flight Alternate Level Stairs-Rails: Right Bathroom Shower/Tub: Occupational psychologist: Standard Bathroom Accessibility: Yes How Accessible: Accessible via walker Home Equipment: Grab bars - tub/shower;Shower Land (2 wheels);Cane - single point   Additional Comments: Pt wife reporting pt has a walker, but poor compliance.      Prior Functioning/Environment Prior Level of Function : Independent/Modified Independent             Mobility Comments: poor compliance with RW. Several falls recently. Pt wife reports that someone has been coming to house to perform BLE exercises and mobility, but she does not think it is a PT (Simultaneous filing. User may not have seen previous data.) ADLs Comments: Pt and wife report pt was independent with ADL, and doing his own laundry, etc        OT Problem List: Decreased strength;Decreased activity tolerance;Impaired balance (sitting and/or standing);Impaired vision/perception;Decreased coordination;Decreased cognition;Decreased safety awareness;Decreased knowledge of use of DME or AE;Impaired UE functional use      OT Treatment/Interventions: Self-care/ADL training;Therapeutic exercise;Energy conservation;DME and/or AE instruction    OT Goals(Current goals can be found in the care plan section) Acute Rehab OT Goals Patient Stated Goal: Go home OT Goal Formulation: With patient/family Time For Goal Achievement: 12/21/21 Potential to Achieve Goals: Fair ADL Goals Pt Will Perform Upper Body Dressing: with modified independence;sitting Pt Will Perform Lower Body Dressing: with modified independence;sit to/from stand Pt Will Transfer to Toilet: with modified independence;ambulating;regular height toilet Pt Will Perform Toileting - Clothing Manipulation and hygiene: with modified independence;sit to/from stand Additional ADL  Goal #1: Pt will identify three fall precautions with mod I.  OT Frequency: Min 2X/week  Co-evaluation              AM-PAC OT "6 Clicks" Daily Activity     Outcome Measure Help from another person eating meals?: A Little Help from another person taking care of personal grooming?: A Little Help from another person toileting, which includes using toliet, bedpan, or urinal?: A Lot Help from another person bathing (including washing, rinsing, drying)?: A Lot Help from another person to put on and taking off regular upper body clothing?: A Little Help from another person to put on and taking off regular lower body clothing?: A Lot 6 Click Score: 15   End of Session Equipment Utilized During Treatment: Gait belt;Rolling walker (2 wheels) Nurse Communication: Mobility status;Other (comment) (orthostatics)  Activity Tolerance: Other (comment) (Limited by flucuating BP.) Patient left: in bed;with call bell/phone within reach;with bed alarm set;with family/visitor present  OT Visit Diagnosis: Unsteadiness on feet (R26.81);Muscle weakness (generalized) (M62.81);History of falling (Z91.81);Dizziness and giddiness (R42);Cognitive communication deficit (R41.841) Symptoms and signs involving cognitive functions:  [facility] (traumatic))                Time: 3559-7416 OT Time Calculation (min): 36 min Charges:  OT General Charges $OT Visit: 1 Visit OT Evaluation $OT Eval Moderate Complexity: 1 Mod OT Treatments $Self Care/Home Management : 8-22 mins  Shanda Howells, OTR/L [facility] Acute Rehabilitation Office: [phone]   Lula Olszewski 12/07/2021, 12:22 PM

## 2021-12-07 NOTE — Evaluation (Addendum)
Physical Therapy Evaluation  Patient Details Name: Patrick [name] MRN: [id number] DOB: Jan 10, 1927 Today's Date: 12/07/2021  History of Present Illness  Pt is a 88 y.o. male transported to [facility] ED 03/08/2022 from outside hospital due to several recent falls and reports of hitting head during fall. CT revealed trace foci of subarachnoid hemorrhage along R frontal lobe not fully extended and interval development of an intermediate vague .4 cm L frontal hyperdensoty along gray-white matter junction.  Pt with recent admission 6/26 -6/28 with intracranial hemorrhage s/p a fall.  PMH significant for CAD, CKD stage IIIa, chronic thrombocytopenia, hypertension, hyperlipidemia, hypothyroidism, BPH, GERD, gout, chronic pain, dementia, depression.   Clinical Impression  Pt admitted with above diagnosis. Pt currently with functional limitations due to the deficits listed below (see PT Problem List). At the time of PT eval pt was able to perform transfers with up to max assist for balance support and safety. BP continues to be orthostatic however pt did not seem to be as limited from hypotension as he was during OT session. Noted significant motor planning deficits and ataxic movement with transition to the chair, requiring up to max assist for balance support and safe transition to sitting. Recommending [facility] follow up to maximize functional independence, decrease burden of care, and decrease risk for falls prior to return home with wife's support. Pt's wife present during session and prefers to take the pt home at d/c. If the pt/wife refuse rehab, recommend HHPT, wheelchair, ambulance transfer home, and frequent +2 assist based on performance today. Acutely, pt will benefit from skilled PT to increase their independence and safety with mobility to allow discharge to the venue listed below.        Orthostatic BPs  Supine 193/69  Sitting 121/63  Sitting after 4 min 145/67  Standing 102/62  Standing after 3 min  104/66  Sitting in recliner end of session 159/69      Recommendations for follow up therapy are one component of a multi-disciplinary discharge planning process, led by the attending physician.  Recommendations may be updated based on patient status, additional functional criteria and insurance authorization.  Follow Up Recommendations Acute inpatient rehab (3hours/day)      Assistance Recommended at Discharge Frequent or constant Supervision/Assistance  Patient can return home with the following  Two people to help with walking and/or transfers;Two people to help with bathing/dressing/bathroom;Assistance with cooking/housework;Assist for transportation;Help with stairs or ramp for entrance    Equipment Recommendations Wheelchair (measurements PT) (if returning home without rehab)  Recommendations for Other Services  Rehab consult    Functional Status Assessment Patient has had a recent decline in their functional status and demonstrates the ability to make significant improvements in function in a reasonable and predictable amount of time.     Precautions / Restrictions Precautions Precautions: Fall Precaution Comments: Orthostatic on eval Restrictions Weight Bearing Restrictions: No      Mobility  Bed Mobility Overal bed mobility: Needs Assistance Bed Mobility: Supine to Sit     Supine to sit: Min guard     General bed mobility comments: Therapist assisted with managing linens around his feet but pt was otherwise able to transition to EOB with increased time/effort and use of rails.    Transfers Overall transfer level: Needs assistance Equipment used: Rolling walker (2 wheels) Transfers: Sit to/from Stand, Bed to chair/wheelchair/BSC Sit to Stand: Mod assist   Step pivot transfers: Max assist       General transfer comment: Assist for power up  to full stand. Posterior lean upon stand and mod assist required to recover. Pt able to take pivotal steps around to the  chair however appeared to have difficulty with motor planning for stand>sit, getting very shaky, and requiring max assist to control lower to the chair. Pt did not let go of the walker and walker essentially in his lap after he sat down. Increased time with tactile cues for pt to let go of it. BP 102/62 upon first stand, to 104/66 upon sit in the chair.    Ambulation/Gait               General Gait Details: Unable to progress to gait training at this time.  Stairs            Wheelchair Mobility    Modified Rankin (Stroke Patients Only) Modified Rankin (Stroke Patients Only) Pre-Morbid Rankin Score: Slight disability Modified Rankin: Moderately severe disability     Balance Overall balance assessment: Needs assistance Sitting-balance support: Single extremity supported, Bilateral upper extremity supported, No upper extremity supported Sitting balance-Leahy Scale: Poor Sitting balance - Comments: requires assist for posterior lean with truncal challenges   Standing balance support: Bilateral upper extremity supported Standing balance-Leahy Scale: Zero Standing balance comment: UP to max assist required at end of transfer to the chair.                             Pertinent Vitals/Pain Pain Assessment Pain Assessment: No/denies pain Pain Descriptors / Indicators: Discomfort Pain Intervention(s): Limited activity within patient's tolerance, Monitored during session, Repositioned    Home Living Family/patient expects to be discharged to:: Private residence Living Arrangements: Spouse/significant other Available Help at Discharge: Family;Available 24 hours/day (Spouse reports they are always together) Type of Home: House Home Access: Stairs to enter Entrance Stairs-Rails: None Entrance Stairs-Number of Steps: 2 Alternate Level Stairs-Number of Steps: flight Home Layout: Two level;Able to live on main level with bedroom/bathroom;Full bath on main level Home  Equipment: Grab bars - tub/shower;Shower Land (2 wheels);Cane - single point Additional Comments: Pt wife reporting pt has a walker, but poor compliance.    Prior Function Prior Level of Function : Independent/Modified Independent             Mobility Comments: poor compliance with RW with several falls recently ADLs Comments: Pt and wife report pt was independent with ADL, and doing his own laundry, etc     Hand Dominance   Dominant Hand: Right    Extremity/Trunk Assessment   Upper Extremity Assessment Upper Extremity Assessment: Defer to OT evaluation RUE Deficits / Details: Pt with decreased fine motor coordination R>L. Poor motor planning.organization during finger opposition. Difficulty with proprioception during finger to nose testing and finger to therapist's finger. Pt with difficulty performing finger to nose and then therapists finger. Pt observed with undershooting consistently. RUE Coordination: decreased fine motor LUE Deficits / Details: Pt with decreased fine motor coordination R>L. Poor motor planning.organization during consecutive finger opposition. Difficulty with proprioception during finger to nose testing and finger to therapist's finger. Pt with difficulty performing finger to nose and then therapists finger. Pt observed with undershooting consistently.    Lower Extremity Assessment Lower Extremity Assessment: Generalized weakness;RLE deficits/detail RLE Deficits / Details: Good strength with MMT bilaterally however LE's appear weak with functional mobility. Quick muscular fatigue. RLE Sensation: history of peripheral neuropathy (Bilaterally)    Cervical / Trunk Assessment Cervical / Trunk Assessment: Other exceptions Cervical /  Trunk Exceptions: Not necessarily kyphotic but with general trunk flexion throughout sitting/standing activity.  Communication   Communication: No difficulties  Cognition Arousal/Alertness: Awake/alert Behavior  During Therapy: Flat affect Overall Cognitive Status: History of cognitive impairments - at baseline                                 General Comments: Pt's wife reports his ability to think and reason is near his baseline. Increaesd time to follow commands/process information, decreased STM, problem solving, sequencing. Pleasant throughout, but with min verbalizations. Documented deficits in memory at baseline.        General Comments General comments (skin integrity, edema, etc.): BP: Supine 153/68 (91); sit 112/78(86); stand 85/50 (60) pt with decrease ability to follow commands becoming pale in color; supine 157/74 (98) after 1 minute    Exercises     Assessment/Plan    PT Assessment Patient needs continued PT services  PT Problem List Decreased strength;Decreased activity tolerance;Decreased balance;Decreased mobility;Decreased coordination;Decreased cognition;Decreased knowledge of use of DME;Decreased safety awareness;Decreased knowledge of precautions       PT Treatment Interventions DME instruction;Gait training;Stair training;Functional mobility training;Therapeutic activities;Therapeutic exercise;Balance training;Neuromuscular re-education;Cognitive remediation;Patient/family education    PT Goals (Current goals can be found in the Care Plan section)  Acute Rehab PT Goals Patient Stated Goal: None stated - wife wants pt to return home PT Goal Formulation: With patient/family Time For Goal Achievement: 12/21/21 Potential to Achieve Goals: Good    Frequency Min 4X/week     Co-evaluation               AM-PAC PT "6 Clicks" Mobility  Outcome Measure Help needed turning from your back to your side while in a flat bed without using bedrails?: A Little Help needed moving from lying on your back to sitting on the side of a flat bed without using bedrails?: A Little Help needed moving to and from a bed to a chair (including a wheelchair)?: A Lot Help  needed standing up from a chair using your arms (e.g., wheelchair or bedside chair)?: A Lot Help needed to walk in hospital room?: Total Help needed climbing 3-5 steps with a railing? : Total 6 Click Score: 12    End of Session Equipment Utilized During Treatment: Gait belt Activity Tolerance: Other (comment) (limited by orthostatic hypotension) Patient left: in chair;with call bell/phone within reach;with chair alarm set;with family/visitor present Nurse Communication: Mobility status (BP status) PT Visit Diagnosis: Unsteadiness on feet (R26.81);Other symptoms and signs involving the nervous system (R29.898)    Time: 5361-4431 PT Time Calculation (min) (ACUTE ONLY): 30 min   Charges:   PT Evaluation $PT Eval Moderate Complexity: 1 Mod PT Treatments $Gait Training: 8-22 mins        Rolinda Roan, PT, DPT Acute Rehabilitation Services Secure Chat Preferred Office: [phone]   Thelma Comp 12/07/2021, 1:56 PM

## 2021-12-07 NOTE — Progress Notes (Signed)
Inpatient Rehab Admissions Coordinator Note:   Per therapy recommendations patient was screened for CIR candidacy by Michel Santee, PT. At this time, pt appears to be a potential candidate for CIR. I will place an order for rehab consult for full assessment, per our protocol.  Please contact me any with questions.Shann Medal, PT, DPT [phone] 12/07/21 3:17 PM

## 2021-12-07 NOTE — Progress Notes (Signed)
PROGRESS NOTE  Patrick Rica H Reha  DOB: [birth year]  PCP: Ginger Organ., MD PRF:[id number]  DOA: 12/05/2021  LOS: 0 days  Hospital Day: 3  Brief narrative: Patrick [name] is a 88 y.o. male with PMH significant for dementia, HTN, HLD, CAD, CKD 3, chronic thrombocytopenia, hypothyroidism, GERD, BPH, gout, chronic pain, depression who was recently admitted 6/26 to 6/28 for small ICH in the setting of thrombocytopenia.  At the time imaging also showed severe stenosis in the posterior circulation patient was not a candidate for antiplatelet agent due to [place]. 7/24, patient was brought to the ED for head injury due to multiple falls.  CT head showed trace foci of subarachnoid hemorrhage along the right frontal lobe without mass effect. Neurosurgery Dr. Annette Stable was consulted.  No surgical intervention ordered repeat follow-up imaging was recommended.  In the ED, patient had blood pressure elevated over 935 systolic and was given IV labetalol. Labs mostly unremarkable except platelet low at 100. Urine did not show signs of infection. Because of concern of poor oral hydration, frequent falls and injury, and significantly elevated blood pressure patient was kept in observation to hospitalist service.  Subjective: Patient was seen and examined this morning.  Pleasant elderly Caucasian male.  Lying down in bed.  Alert, awake, oriented to place and person, not to time.  Wife at bedside. Chart reviewed No fever, blood pressure was elevated over 701 systolic last 24 hours, 779/39 this morning.  Assessment and plan: Small subarachnoid hemorrhage in the setting of chronic thrombocytopenia and recent falls -Patient with history of chronic thrombocytopenia, frequent falls, recent [place]  -Imaging in the ED showed new small SAH.  In the absence of mass effect, no need of intervention or repeat imaging for neurosurgery.    Hypertensive urgency Orthostatic hypotension -I had detailed conversation about  this with patient's wife.  She states that he used to see cardiologist Dr. Acie Fredrickson as an outpatient and was on ARB and Florinef at some point.  Eventually his blood pressure was stabilized and he was taken off both of them.   -In his last hospitalization, he was noted to have orthostatic hypotension and hence Florinef was resumed.  She believes he started getting weak and started falling since then. -In last 24 hours, he had several elevated blood pressure reading over 200.  Blood pressure this morning was 150/68.  It dropped to 80s on standing up.  I would avoid Florinef at this time as it seems to be causing blood pressure surge.  -Hydralazine as needed to target blood pressure controlled to less than 160 to prevent recurrence of ICH  Frequent falls -In the setting of orthostatic hypotension, peripheral neuropathy -PT OT eval.  Fall precautions  Chronic thrombocytopenia -Low but stable.   Recent Labs  Lab 12/05/21 1425 12/05/21 1630 12/07/21 0335  PLT 100* 106* 92*   Dementia with behavioral symptoms Depression -Continue Namenda, Depakote, Zoloft, Remeron, and Cymbalta  CAD -Not on antiplatelets because of falls.  Continue Zetia  CKD 3a -Renal function is stable Recent Labs    03/16/21 1155 06/28/21 1750 09/25/21 1650 11/07/21 1731 11/08/21 0250 11/09/21 0144 12/05/21 1425 12/05/21 1630  BUN 19 21 28* '21 20 20 23 22  '$ CREATININE 1.20 1.27* 1.58* 1.29* 1.18 1.06 1.33* 1.22   Peripheral neuropathy -Continue Lyrica   Hypothyroidism -Continue Synthroid   BPH -Continue finasteride.  I would avoid Flomax because of significant orthostatic hypotension   GERD -Continue Protonix   Gout -Continue allopurinol  Goals of care   Code Status: Full Code    Mobility: Pending PT eval  Skin assessment:     Nutritional status:  Body mass index is 20.61 kg/m.          Diet:  Diet Order             Diet Heart Room service appropriate? Yes; Fluid consistency:  Thin  Diet effective now                   DVT prophylaxis:  SCDs Start: 12/06/21 2248   Antimicrobials: None Fluid: None Consultants: None Family Communication: Wife at bedside  Status is: Observation  Continue in-hospital care because: Pending blood pressure stabilization Level of care: Progressive   Dispo: The patient is from: Home              Anticipated d/c is to: Pending clinical course              Patient currently is not medically stable to d/c.   Difficult to place patient No     Infusions:     Scheduled Meds:  allopurinol  100 mg Oral Daily   divalproex  250 mg Oral BID   DULoxetine  60 mg Oral Daily   ezetimibe  10 mg Oral Daily   finasteride  5 mg Oral Daily   levothyroxine  112 mcg Oral Q0600   lidocaine  1 patch Transdermal Q24H   memantine  10 mg Oral QHS   mirtazapine  15 mg Oral QHS   pantoprazole  40 mg Oral QAC breakfast   pregabalin  75 mg Oral BID   sertraline  100 mg Oral Daily    PRN meds: acetaminophen **OR** acetaminophen, hydrALAZINE, labetalol   Antimicrobials: Anti-infectives (From admission, onward)    None       Objective: Vitals:   12/07/21 0358 12/07/21 0700  BP: (!) 146/62 (!) 150/68  Pulse:  68  Resp:  16  Temp:  97.9 F (36.6 C)  SpO2:  100%    Intake/Output Summary (Last 24 hours) at 12/07/2021 1153 Last data filed at 12/07/2021 0920 Gross per 24 hour  Intake 102.5 ml  Output --  Net 102.5 ml   Filed Weights   12/05/21 1618  Weight: 68.9 kg   Weight change:  Body mass index is 20.61 kg/m.   Physical Exam: General exam: Pleasant, elderly Caucasian male.  Not in physical distress Skin: No rashes, lesions or ulcers. HEENT: Atraumatic, normocephalic, no obvious bleeding Lungs: Clear to auscultation bilaterally CVS: Regular rate and rhythm, no murmur GI/Abd soft, nontender, nondistended, bowel sound present CNS: Alert, awake, oriented to place and person Psychiatry: Mood  appropriate Extremities: No pedal edema, no calf tenderness  Data Review: I have personally reviewed the laboratory data and studies available.  F/u labs ordered Unresulted Labs (From admission, onward)    None       Signed, Terrilee Croak, MD [facility] 12/07/2021

## 2021-12-07 NOTE — TOC Initial Note (Signed)
Transition of Care [facility]) - Initial/Assessment Note    Patient Details  Name: Patrick [name] MRN: [id number] Date of Birth: 02-26-1927  [facility]) CM/SW Contact:    Pollie Friar, RN Phone Number: 12/07/2021, 2:55 PM  Clinical Narrative:                 Patient is from home with his spouse. Recommendations are for CIR. Also awaiting palliative care consult..  TOC following.  Expected Discharge Plan: IP Rehab Facility Barriers to Discharge: Continued Medical Work up   Patient Goals and CMS Choice   CMS Medicare.gov Compare Post Acute Care list provided to:: Patient Represenative (must comment) Choice offered to / list presented to : Spouse  Expected Discharge Plan and Services Expected Discharge Plan: [place]     Post Acute Care Choice: IP Rehab                                        Prior Living Arrangements/Services   Lives with:: Spouse                   Activities of Daily Living      Permission Sought/Granted                  Emotional Assessment              Admission diagnosis:  Intracranial hemorrhage ([place]) [I62.9] Acute intra-cranial hemorrhage ([place]) [I62.9] Patient Active Problem List   Diagnosis Date Noted   Frequent falls 12/06/2021   Intracranial hemorrhage ([place]) 12/05/2021   Transient LOC (loss of consciousness) 11/08/2021   Orthostatic hypotension    Cerebral parenchymal hemorrhage ([place]) 11/07/2021   Stage 3a chronic kidney disease (CKD) ([place]) 11/07/2021   Hypertensive urgency 11/07/2021   Chronic pain 11/07/2021   Prolonged QT interval 11/07/2021   Food impaction of esophagus 06/28/2021   Memory disorder 12/17/2019   Bilateral foot-drop 12/17/2019   Gait abnormality 12/17/2019   Tremor, essential 12/17/2019   AKI (acute kidney injury) ([place]) 06/23/2019   Chest pain, rule out acute myocardial infarction 06/23/2019   Diverticulitis of intestine with perforation and abscess without  bleeding 02/26/2019   Hypokalemia 02/26/2019   Occult blood positive stool 02/26/2019   Acute diverticulitis 02/19/2019   Thrombocytopenia ([place]) 11/19/2018   Unstable angina ([place]) 11/14/2018   Essential (primary) hypertension 02/25/2018   Claudication ([place]) 04/21/2016   History of cold sores 03/22/2011   Coronary artery disease involving native coronary artery of native heart without angina pectoris 10/28/2010   Hyperlipemia 10/28/2010   Hypothyroidism 10/28/2010   PCP:  Ginger Organ., MD Pharmacy:   [facility] - [place], [place] - [address] [place] [address] Phone: [phone] Fax: [phone]  [facility] [place], [place] [address] Phone: [phone] Fax: [phone]  [phone] [place], [place] - [place] [address] Phone: [phone] Fax: [place], [place] - [address] [place] [address] Phone: [phone] Fax: [phone]     Social Determinants of Health (SDOH) Interventions    Readmission Risk Interventions  No data to display

## 2021-12-08 DIAGNOSIS — I629 Nontraumatic intracranial hemorrhage, unspecified: Secondary | ICD-10-CM | POA: Diagnosis not present

## 2021-12-08 DIAGNOSIS — D696 Thrombocytopenia, unspecified: Secondary | ICD-10-CM

## 2021-12-08 DIAGNOSIS — Z515 Encounter for palliative care: Secondary | ICD-10-CM

## 2021-12-08 DIAGNOSIS — R296 Repeated falls: Secondary | ICD-10-CM | POA: Diagnosis not present

## 2021-12-08 DIAGNOSIS — I160 Hypertensive urgency: Secondary | ICD-10-CM

## 2021-12-08 DIAGNOSIS — Z7189 Other specified counseling: Secondary | ICD-10-CM

## 2021-12-08 LAB — URINALYSIS, ROUTINE W REFLEX MICROSCOPIC
Bacteria, UA: NONE SEEN
Bilirubin Urine: NEGATIVE
Glucose, UA: NEGATIVE mg/dL
Ketones, ur: NEGATIVE mg/dL
Leukocytes,Ua: NEGATIVE
Nitrite: NEGATIVE
Protein, ur: NEGATIVE mg/dL
Specific Gravity, Urine: 1.017 (ref 1.005–1.030)
pH: 5 (ref 5.0–8.0)

## 2021-12-08 MED ORDER — OXYCODONE-ACETAMINOPHEN 5-325 MG PO TABS
1.0000 | ORAL_TABLET | Freq: Four times a day (QID) | ORAL | Status: DC | PRN
  Administered 2021-12-09: 1 via ORAL
  Filled 2021-12-08: qty 1

## 2021-12-08 NOTE — Progress Notes (Signed)
Occupational Therapy Treatment Patient Details Name: Patrick [name] MRN: [id number] DOB: 1926-08-01 Today's Date: 12/08/2021   History of present illness Pt is a 88 y.o. male transported to [facility] ED 03/08/2022 from outside hospital due to several recent falls and reports of hitting head during fall. CT revealed trace foci of subarachnoid hemorrhage along R frontal lobe not fully extended and interval development of an intermediate vague .4 cm L frontal hyperdensoty along gray-white matter junction.  Pt with recent admission 6/26 -6/28 with intracranial hemorrhage s/p a fall.  PMH significant for CAD, CKD stage IIIa, chronic thrombocytopenia, hypertension, hyperlipidemia, hypothyroidism, BPH, GERD, gout, chronic pain, dementia, depression.   OT comments  Patient supine in bed, pleasant and cooperative throughout session.  Remains limited by hypotension with standing- demonstrates decreased processing, problem solving, motor planning, slurred speech, coordination and vision when standing.  Today, requires min guard for bed mobility, total assist for LB dressing, min assist for grooming and max assist +2 for stand step transfer to recliner (difficulty moving R LE today) towards L side.  He denies dizziness or lightheadedness in standing but reports vision changes, blurriness and diplopia.  Reports diplopia resolves when sitting but continues to overshoot when reaching for therapist or cup.  Highly recommend AIR at this time.    BP during session:  194/88 supine  135/70 EOOB 85/52 standing 126/71  sitting down EOB after initial stand 94/52 second stand 112/73 second sit EOB 87/54 after getting to the chair 152/88 after 2 minutes in the chair   Recommendations for follow up therapy are one component of a multi-disciplinary discharge planning process, led by the attending physician.  Recommendations may be updated based on patient status, additional functional criteria and insurance authorization.     Follow Up Recommendations  Acute inpatient rehab (3hours/day)    Assistance Recommended at Discharge Frequent or constant Supervision/Assistance  Patient can return home with the following  A lot of help with bathing/dressing/bathroom;Assistance with cooking/housework;Assist for transportation;Direct supervision/assist for financial management;Direct supervision/assist for medications management;Help with stairs or ramp for entrance;Two people to help with walking and/or transfers   Equipment Recommendations  Other (comment) (defer)    Recommendations for Other Services      Precautions / Restrictions Precautions Precautions: Fall Precaution Comments: watch BP, orthostatic Restrictions Weight Bearing Restrictions: No       Mobility Bed Mobility Overal bed mobility: Needs Assistance Bed Mobility: Supine to Sit     Supine to sit: Min guard     General bed mobility comments: pt transitioning to long sitting, then pivoting around to EOB with min guard. cueing for sequencing    Transfers Overall transfer level: Needs assistance Equipment used: Rolling walker (2 wheels) Transfers: Sit to/from Stand, Bed to chair/wheelchair/BSC Sit to Stand: Min assist, +2 safety/equipment     Step pivot transfers: Max assist, +2 physical assistance, +2 safety/equipment     General transfer comment: power up from EOB with min assist +2 for safety, increased time to become balanced.  Stood several times to assess blood pressure, when stepping to recliner patient demonstrating increased difficulty motor planning and progressing R LE towards chair (overall requiring total assist with R LE).     Balance Overall balance assessment: Needs assistance Sitting-balance support: No upper extremity supported, Feet supported Sitting balance-Leahy Scale: Fair Sitting balance - Comments: min guard statically up to mod assist dynamically with L lateral/posterior lean during LB ADLs   Standing balance  support: Bilateral upper extremity supported, During functional activity Standing balance-Leahy  Scale: Zero Standing balance comment: UP to max assist required at end of transfer to the chair.                           ADL either performed or assessed with clinical judgement   ADL Overall ADL's : Needs assistance/impaired                     Lower Body Dressing: +2 for safety/equipment;+2 for physical assistance;Sit to/from stand;Maximal assistance Lower Body Dressing Details (indicate cue type and reason): require assist to don socks, min assist +2 safety standing Toilet Transfer: +2 for safety/equipment;Maximal assistance;+2 for physical assistance;Stand-pivot;Rolling walker (2 wheels) Toilet Transfer Details (indicate cue type and reason): simulated to recliner         Functional mobility during ADLs: Minimal assistance;Maximal assistance;+2 for physical assistance;+2 for safety/equipment;Rolling walker (2 wheels);Cueing for sequencing;Cueing for safety General ADL Comments: limited by BP, see clinical impression; cognition and safety    Extremity/Trunk Assessment              Vision   Vision Assessment?: Vision impaired- to be further tested in functional context;Yes Depth Perception: Overshoots Additional Comments: while standing, pt reports "feeling different" when asked to describe reports his vision is off, blurry.  Furthermore, overshooting when reaching for cup after sitting and reports "seeing double".  Continues to overshoot when denies double vision when sitting.  Continue assessment.   Perception     Praxis      Cognition Arousal/Alertness: Awake/alert Behavior During Therapy: Flat affect Overall Cognitive Status: History of cognitive impairments - at baseline                                 General Comments: pt with hx of cognitive deficits, he requires increased time to follow commands and sequence (increased even more with  hypotension).  Pleasant throughout session.        Exercises      Shoulder Instructions       General Comments see clinical impression for BP    Pertinent Vitals/ Pain       Pain Assessment Pain Assessment: No/denies pain  Home Living   Living Arrangements: Spouse/significant other Available Help at Discharge: Family;Available 24 hours/day Type of Home: House Home Access: Stairs to enter [employer] of Steps: 2 Entrance Stairs-Rails: Right Home Layout: Able to live on main level with bedroom/bathroom     Bathroom Shower/Tub: Occupational psychologist: Handicapped height            Lives With: Spouse    Prior Functioning/Environment              Frequency  Min 2X/week        Progress Toward Goals  OT Goals(current goals can now be found in the care plan section)  Progress towards OT goals: Progressing toward goals  Acute Rehab OT Goals Patient Stated Goal: home OT Goal Formulation: With patient/family Time For Goal Achievement: 12/21/21 Potential to Achieve Goals: [place] Discharge plan remains appropriate;Frequency remains appropriate    Co-evaluation    PT/OT/SLP Co-Evaluation/Treatment: Yes Reason for Co-Treatment: For patient/therapist safety;To address functional/ADL transfers;Necessary to address cognition/behavior during functional activity   OT goals addressed during session: ADL's and self-care      AM-PAC OT "6 Clicks" Daily Activity     Outcome Measure   Help from another  person eating meals?: A Little Help from another person taking care of personal grooming?: A Little Help from another person toileting, which includes using toliet, bedpan, or urinal?: A Lot Help from another person bathing (including washing, rinsing, drying)?: A Lot Help from another person to put on and taking off regular upper body clothing?: A Little Help from another person to put on and taking off regular lower body clothing?:  Total 6 Click Score: 14    End of Session Equipment Utilized During Treatment: Gait belt;Rolling walker (2 wheels);Other (comment) (abdominal binder)  OT Visit Diagnosis: Unsteadiness on feet (R26.81);Muscle weakness (generalized) (M62.81);History of falling (Z91.81);Dizziness and giddiness (R42);Cognitive communication deficit (R41.841)   Activity Tolerance Treatment limited secondary to medical complications (Comment) (BP)   Patient Left with call bell/phone within reach;in chair;with chair alarm set;with family/visitor present   Nurse Communication Mobility status;Precautions        Time: 3419-3790 OT Time Calculation (min): 36 min  Charges: OT General Charges $OT Visit: 1 Visit OT Treatments $Self Care/Home Management : 8-22 mins  Jolaine Artist, [place] Office [phone]   Delight Stare 12/08/2021, 11:19 AM

## 2021-12-08 NOTE — PMR Pre-admission (Signed)
PMR Admission Coordinator Pre-Admission Assessment  Patient: Patrick [name] is an 88 y.o., male MRN: [id number] DOB: February 17, 1927 Height: 6' (182.9 cm) Weight: 68.9 kg  Insurance Information HMO:     PPO:      PCP:      IPA:      80/20: yes     OTHER:  PRIMARY: Medicare A & B      Policy#: [id number]      Subscriber: patient CM Name:       Phone#:      Fax#:  Pre-Cert#:       Employer:  Benefits:  Phone #: verified eligibility via [place] on 12/08/21     Name:  Eff. Date: Part A effective 08/14/95, Part B effective 04/14/02     Deduct: $1,600      Out of Pocket Max: NA      Life Max: NA CIR: 100% coverage      SNF: 100% coverage days 1-20, 80% coverage days 21-100 Outpatient: 80% coverage     Co-Pay: 20% Home Health: 100% coverage      Co-Pay:  DME: 80% coverage     Co-Pay: 20% Providers: pt's choice SECONDARY: BCBS Supplement    Policy#: [id number]     Phone#: [phone]  Financial Counselor:       Phone#:   The "Data Collection Information Summary" for patients in Inpatient Rehabilitation Facilities with attached "Privacy Act [place] Records" was provided and verbally reviewed with: patient, family  Emergency Contact Information Contact Information     Name Relation Home Work Mobile   [place] Spouse [phone]  [phone]       Current Medical History  Patient Admitting Diagnosis: ICH History of Present Illness: Pt is a 88 year old male with medical hx significant for: CAD, CKD stage IIIa, chronic thrombocytopenia, HTN, hyperlipidemia, hypothyroidism, BPH, GERD, gout, chronic pain, depression, bilateral foot drop, tremor, dementia. Pt presented to [facility] on 12/05/21 after multiple falls; pt hit head. Progressive weakness and slurred speech also endorsed. Pt recently hospitalized 6/26-6/28 for small ICH in the setting of thrombocytopenia. Imaging also revealed severe stenosis in posterior circulation; not a candidate for  antiplatelet agents d/t ICH. CT on 7/24 showed trace foci of subarachnoid hemorrhage along right frontal lobe. Interval development of an indeterminate vague 0.4cm left frontal hyperdensity along the gray-white matter junction. No neurosurgical intervention recommended. Pt  was dehydrated and given IV fluids and was hypertensive and given IV labetalol.  Pt transferred to [facility] on 12/06/21. Pt has been orthostatic; started on abdominal binder and TED hose.Therapy evaluations completed and CIR recommended d/t pt's deficits in functional mobility and inability to complete ADLs independently. Complete NIHSS TOTAL: 2  Patient's medical record from [facility] has been reviewed by the rehabilitation admission coordinator and physician.  Past Medical History  Past Medical History:  Diagnosis Date   Bilateral foot-drop 12/17/2019   BPH (benign prostatic hypertrophy)    Diverticulosis    ED (erectile dysfunction)    Food impaction of esophagus w/mild gastritis 06/28/2021   Gait abnormality 12/17/2019   GERD (gastroesophageal reflux disease)    Gout    Hepatitis    hx hepatitis after mono as teenager   History of kidney stones    History of skin cancer    Hyperlipidemia    Hypertension    Hypothyroidism    IHD (ischemic heart disease)    Remote CABG in 1997   Memory disorder 12/17/2019  MI, acute, non ST segment elevation ([name]) 2010   s/p cath with occluded SVG to PD that fills by collaterals and the remainder of his revasculsarization is satisfactory. 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 No  Family History   family history includes Heart failure (age of onset: 11) in his  mother.  Current Medications  Current Facility-Administered Medications:    acetaminophen (TYLENOL) tablet 650 mg, 650 mg, Oral, Q6H PRN **OR** acetaminophen (TYLENOL) suppository 650 mg, 650 mg, Rectal, Q6H PRN, John Giovanni, MD   allopurinol (ZYLOPRIM) tablet 100 mg, 100 mg, Oral, Daily, John Giovanni, MD, 100 mg at 12/09/21 1037   cephALEXin (KEFLEX) capsule 500 mg, 500 mg, Oral, Q8H, Dahal, Binaya, MD, 500 mg at 12/09/21 1157   divalproex (DEPAKOTE) DR tablet 250 mg, 250 mg, Oral, BID, Dahal, Binaya, MD, 250 mg at 12/09/21 1037   ezetimibe (ZETIA) tablet 10 mg, 10 mg, Oral, Daily, John Giovanni, MD, 10 mg at 12/09/21 1037   finasteride (PROSCAR) tablet 5 mg, 5 mg, Oral, Daily, John Giovanni, MD, 5 mg at 12/09/21 1037   fludrocortisone (FLORINEF) tablet 0.05 mg, 0.05 mg, Oral, Daily, Dahal, Binaya, MD, 0.05 mg at 12/09/21 1157   hydrALAZINE (APRESOLINE) injection 10 mg, 10 mg, Intravenous, Q4H PRN, John Giovanni, MD, 10 mg at 12/09/21 5279   labetalol (NORMODYNE) injection 10 mg, 10 mg, Intravenous, Q2H PRN, John Giovanni, MD, 10 mg at 12/07/21 0114   levothyroxine (SYNTHROID) tablet 112 mcg, 112 mcg, Oral, Q0600, John Giovanni, MD, 112 mcg at 12/09/21 0652   lidocaine (LIDODERM) 5 % 1 patch, 1 patch, Transdermal, Q24H, John Giovanni, MD, 1 patch at 12/09/21 1036   memantine (NAMENDA) tablet 10 mg, 10 mg, Oral, QHS, John Giovanni, MD, 10 mg at 12/08/21 2032   mirtazapine (REMERON) tablet 15 mg, 15 mg, Oral, QHS, John Giovanni, MD, 15 mg at 12/08/21 2032   oxyCODONE-acetaminophen (PERCOCET/ROXICET) 5-325 MG per tablet 1 tablet, 1 tablet, Oral, Q6H PRN, Dahal, Melina Schools, MD, 1 tablet at 12/09/21 0306   pantoprazole (PROTONIX) EC tablet 40 mg, 40 mg, Oral, QAC breakfast, John Giovanni, MD, 40 mg at 12/09/21 6497  Patients Current Diet:  Diet Order             Diet Heart Room service appropriate? Yes; Fluid consistency: Thin  Diet effective  now                   Precautions / Restrictions Precautions Precautions: Fall Precaution Comments: watch BP, orthostatic Restrictions Weight Bearing Restrictions: No   Has the patient had 2 or more falls or a fall with injury in the past year? Yes  Prior Activity Level Community (5-7x/wk): gets out of house often  Prior Functional Level Self Care: Did the patient need help bathing, dressing, using the toilet or eating? Independent  Indoor Mobility: Did the patient need assistance with walking from room to room (with or without device)? Independent  Stairs: Did the patient need assistance with internal or external stairs (with or without device)? Independent  Functional Cognition: Did the patient need help planning regular tasks such as shopping or remembering to take medications? Needed some help  Patient Information Are you of Hispanic, Latino/a,or Spanish origin?: A. No, not of Hispanic, Latino/a, or Spanish origin What is your race?: A. White Do you need or want an interpreter to communicate with a doctor or health care staff?: 0. No  Patient's Response To:  Health Literacy and Transportation Is the patient able to respond to health literacy and transportation needs?: Yes Health Literacy - How often do you need to have someone help you when you read instructions, pamphlets, or other written material from your doctor or pharmacy?: Often (wife reads medical information) In the past 12 months, has lack of transportation kept you from medical appointments or from getting medications?: No In the past 12 months, has lack of transportation kept you from meetings, work, or from getting things needed for daily living?: No  Development worker, international aid / [place]: Grab bars - tub/shower, Civil engineer, contracting, Conservation officer, nature (2 wheels), [employer] - single point  Prior Device Use: Indicate devices/aids used by the patient prior to current illness, exacerbation or injury? None of the  above (has used a walker recently)  Current Functional Level Cognition  Overall Cognitive Status: Impaired/Different from baseline Current Attention Level: Sustained Orientation Level: Oriented to person, Oriented to place, Oriented to time, Disoriented to situation Following Commands: Follows one step commands with increased time General Comments: Pt with slower cognitive processing to instructions given at times and slower verbal response.    Extremity Assessment (includes Sensation/Coordination)  Upper Extremity Assessment: Defer to OT evaluation RUE Deficits / Details: Pt with decreased fine motor coordination R>L. Poor motor planning.organization during finger opposition. Difficulty with proprioception during finger to nose testing and finger to therapist's finger. Pt with difficulty performing finger to nose and then therapists finger. Pt observed with undershooting consistently. RUE Coordination: decreased fine motor LUE Deficits / Details: Pt with decreased fine motor coordination R>L. Poor motor planning.organization during consecutive finger opposition. Difficulty with proprioception during finger to nose testing and finger to therapist's finger. Pt with difficulty performing finger to nose and then therapists finger. Pt observed with undershooting consistently.  Lower Extremity Assessment: Generalized weakness, RLE deficits/detail RLE Deficits / Details: Good strength with MMT bilaterally however LE's appear weak with functional mobility. Quick muscular fatigue. RLE Sensation: history of peripheral neuropathy (Bilaterally)    ADLs  Overall ADL's : Needs assistance/impaired Grooming: Set up, Min guard, Sitting Grooming Details (indicate cue type and reason): Min guard A for safety due to inconsistencies in BP Upper Body Bathing: Min guard, Sitting Lower Body Bathing: Sit to/from stand, Moderate assistance Upper Body Dressing : Minimal assistance, Sitting Lower Body Dressing: +2  for safety/equipment, +2 for physical assistance, Sit to/from stand, Maximal assistance Lower Body Dressing Details (indicate cue type and reason): require assist to don socks, min assist +2 safety standing Toilet Transfer: +2 for physical assistance, Stand-pivot, Maximal assistance Toilet Transfer Details (indicate cue type and reason): simulated to recliner, pt declined need to toilet and has condom catheter Toileting- Clothing Manipulation and Hygiene: Moderate assistance, +2 for physical assistance, Sit to/from stand Toileting - Clothing Manipulation Details (indicate cue type and reason): Pt on wet sheet on arrival. Bed mobility after BP drop to clean up. Tub/ Shower Transfer:  (defer this session. unable to progress mobility for safety) Functional mobility during ADLs: Maximal assistance, +2 for physical assistance General ADL Comments: Pt still with orthostatic hypotension in standing with TEDS and binder in place.  In supine BP at 152/74 and in sitting at 125/74-135/73.  With standing it would  decreased down into the 39Q systolically and in the 30-09 range diastolically in less than 2 mins of standing requiring the need to sit down.  Decreased RUE coordination with ataxia noted when attempting to hold and donn his gripper socks.    Mobility  Overal bed mobility: Needs Assistance Bed Mobility: Supine to Sit Rolling: Supervision, Min assist (increased time; min A following decreased in BP and for cleaning up in bed.) Sidelying to sit: Min guard (Increaed time. Min guard A for safety) Supine to sit: Min assist Sit to supine: Total assist (drop in BP total assist back to supine for pt safety) General bed mobility comments: Mod demonstrational cueing needed to sequence bathing with min assist for sidelying to sit after rolling over.    Transfers  Overall transfer level: Needs assistance Equipment used: None Transfers: Sit to/from Stand, Bed to chair/wheelchair/BSC Sit to Stand: +2  safety/equipment, Mod assist Bed to/from chair/wheelchair/BSC transfer type:: Step pivot Step pivot transfers: Max assist, +2 physical assistance, +2 safety/equipment General transfer comment: Increased ataxia noted in standing as well as increased right knee flexion and external rotation requiring mod to max assist from therapist to try to achieve and maintain knee extension in weightbearing.    Ambulation / Gait / Stairs / Wheelchair Mobility  Ambulation/Gait General Gait Details: Unable to progress to gait training at this time.    Posture / Balance Dynamic Sitting Balance Sitting balance - Comments: Pt with right lean and LOB frequently with static sitting.  Max assist to maintain balance with dynamic tasks such as dressing. Balance Overall balance assessment: Needs assistance Sitting-balance support: No upper extremity supported, Feet supported Sitting balance-Leahy Scale: Poor Sitting balance - Comments: Pt with right lean and LOB frequently with static sitting.  Max assist to maintain balance with dynamic tasks such as dressing. Standing balance support: Bilateral upper extremity supported, During functional activity Standing balance-Leahy Scale: Zero Standing balance comment: Pt needs +2 assist for safety with standing and transfers.    Special needs/care consideration External urinary catheter   Previous Home Environment (from acute therapy documentation) Living Arrangements: Spouse/significant other  Lives With: Spouse Available Help at Discharge: Family, Available 24 hours/day Type of Home: House Home Layout: Able to live on main level with bedroom/bathroom Alternate Level Stairs-Rails: Right Alternate Level Stairs-Number of Steps: flight Home Access: Stairs to enter Entrance Stairs-Rails: Right Entrance Stairs-Number of Steps: 2 Bathroom Shower/Tub: Multimedia programmer: Handicapped height Bathroom Accessibility: Yes How Accessible: Accessible via  walker [place]: Yes Type of Home Care Services: Home PT Additional Comments: Pt wife reporting pt has a walker, but poor compliance.  Discharge Living Setting Plans for Discharge Living Setting: Patient's home Type of Home at Discharge: House Discharge Home Layout: Able to live on main level with bedroom/bathroom Discharge Home Access: Stairs to enter Entrance Stairs-Rails: Right Entrance Stairs-Number of Steps: 2 Discharge Bathroom Shower/Tub: Walk-in shower Discharge Bathroom Toilet: Handicapped height Discharge Bathroom Accessibility: Yes How Accessible: Accessible via walker Does the patient have any problems obtaining your medications?: No  Social/Family/Support Systems Anticipated Caregiver: Elihu Milstein, wife Anticipated Caregiver's Contact Information: [phone] Caregiver Availability: 24/7 Discharge Plan Discussed with Primary Caregiver: Yes Is Caregiver In Agreement with Plan?: Yes Does Caregiver/Family have Issues with Lodging/Transportation while Pt is in Rehab?: No  Goals Patient/Family Goal for Rehab: Supervision-Min A: PT/OT Expected length of stay: 14-16 days Pt/Family Agrees to Admission and willing to participate: Yes Program Orientation Provided & Reviewed with Pt/Caregiver Including Roles  & Responsibilities: Yes  Decrease burden of Care through IP rehab admission: NA  Possible need for SNF placement upon discharge: Not anticipated  Patient Condition: I have reviewed medical records from [facility], spoken with CM, and patient and spouse. I met with patient at the bedside for inpatient rehabilitation assessment.  Patient will benefit from ongoing PT and OT, can actively participate in 3 hours of therapy a day 5 days of the week, and can make measurable gains during the admission.  Patient will also benefit from the coordinated team approach during an Inpatient Acute Rehabilitation admission.  The patient will receive intensive therapy  as well as Rehabilitation physician, nursing, social worker, and care management interventions.  Due to bladder management, safety, disease management, medication administration, pain management, and patient education the patient requires 24 hour a day rehabilitation nursing.  The patient is currently Min-Max A +2 with mobility and Max A with basic ADLs.  Discharge setting and therapy post discharge at home with home health is anticipated.  Patient has agreed to participate in the Acute Inpatient Rehabilitation Program and will admit today.  Preadmission Screen Completed By:  Bethel Born, 12/09/2021 12:50 PM ______________________________________________________________________   Discussed status with Dr. Ranell Patrick on 12/09/21 at 12:50 PM and received approval for admission today.  Admission Coordinator:  Bethel Born, CCC-SLP, time 12:50 PM/Date 12/09/21    Assessment/Plan: Diagnosis: ICH Does the need for close, 24 hr/day Medical supervision in concert with the patient's rehab needs make it unreasonable for this patient to be served in a less intensive setting? Yes Co-Morbidities requiring supervision/potential complications: gout, orthostatic hypotension, HTN, dementia, CAD, bilateral foot drop Due to bladder management, bowel management, safety, skin/wound care, disease management, medication administration, pain management, and patient education, does the patient require 24 hr/day rehab nursing? Yes Does the patient require coordinated care of a physician, rehab nurse, PT, OT, and SLP to address physical and functional deficits in the context of the above medical diagnosis(es)? Yes Addressing deficits in the following areas: balance, endurance, locomotion, strength, transferring, bowel/bladder control, bathing, dressing, feeding, grooming, toileting, and cognition Can the patient actively participate in an intensive therapy program of at least 3 hrs of therapy 5 days a week?  Yes The potential for patient to make measurable gains while on inpatient rehab is excellent Anticipated functional outcomes upon discharge from inpatient rehab: supervision PT, supervision OT, supervision SLP Estimated rehab length of stay to reach the above functional goals is: 21 days Anticipated discharge destination: Home 10. Overall Rehab/Functional Prognosis: excellent   MD Signature: Leeroy Cha, MD

## 2021-12-08 NOTE — Progress Notes (Signed)
Inpatient Rehab Admissions:  Inpatient Rehab Consult received.  I met with patient and wife [name] at the bedside for rehabilitation assessment and to discuss goals and expectations of an inpatient rehab admission.  Both acknowledged understanding of CIR goals and expectations. Pt interested in pursuing CIR. Wife supportive. Wife confirmed that she will be able to provide 24/7 supervision and she will hire additional help if needed.  Will continue to follow.  Signed: Gayland Curry, [place], [place] Admissions Coordinator [phone]

## 2021-12-08 NOTE — Progress Notes (Signed)
Physical Therapy Treatment Patient Details Name: Patrick [name] MRN: [id number] DOB: 19-Dec-1926 Today's Date: 12/08/2021   History of Present Illness Pt is a 88 y.o. male transported to [facility] ED 03/08/2022 from outside hospital due to several recent falls and reports of hitting head during fall. CT revealed trace foci of subarachnoid hemorrhage along R frontal lobe not fully extended and interval development of an intermediate vague .4 cm L frontal hyperdensoty along gray-white matter junction.  Pt with recent admission 6/26 -6/28 with intracranial hemorrhage s/p a fall.  PMH significant for CAD, CKD stage IIIa, chronic thrombocytopenia, hypertension, hyperlipidemia, hypothyroidism, BPH, GERD, gout, chronic pain, dementia, depression.    PT Comments    Pt progressing towards physical therapy goals. Focus of session continued to be mobility while monitoring orthostatic hypotension. RN present and provided abdominal binder. Pt wore for transfer to the chair, however once BP stabilized, binder removed to avoid accommodation. Recommend it be donned prior to mobility. With drops in BP pt complaining of diplopia, noted slurred speech, and decreased motor planning. Continue to recommend AIR follow up to maximize functional return and prepare him for transition back home with family support.   Orthostatic BPs  Supine 194/88  Sitting 135/70  Standing 85/52  Sitting EOB after initial stand 126/71  Second Stand 94/52  Sitting EOB after second stand 112/73  Transfer to the chair 87/54  Sitting in chair after 2 minutes 152/88    Recommendations for follow up therapy are one component of a multi-disciplinary discharge planning process, led by the attending physician.  Recommendations may be updated based on patient status, additional functional criteria and insurance authorization.  Follow Up Recommendations  Acute inpatient rehab (3hours/day)     Assistance Recommended at Discharge Frequent or  constant Supervision/Assistance  Patient can return home with the following Two people to help with walking and/or transfers;Two people to help with bathing/dressing/bathroom;Assistance with cooking/housework;Assist for transportation;Help with stairs or ramp for entrance   Equipment Recommendations  Wheelchair (measurements PT) (if returning home without rehab)    Recommendations for Other Services Rehab consult     Precautions / Restrictions Precautions Precautions: Fall Precaution Comments: watch BP, orthostatic Restrictions Weight Bearing Restrictions: No     Mobility  Bed Mobility Overal bed mobility: Needs Assistance Bed Mobility: Supine to Sit     Supine to sit: Min guard     General bed mobility comments: Increased time to transition fully to EOB. Pt initially sat up into long sitting and then brought feet around to EOB.    Transfers Overall transfer level: Needs assistance Equipment used: Rolling walker (2 wheels) Transfers: Sit to/from Stand, Bed to chair/wheelchair/BSC Sit to Stand: Min assist, +2 safety/equipment   Step pivot transfers: Max assist, +2 physical assistance, +2 safety/equipment       General transfer comment: power up from EOB with min assist +2 for safety, increased time to gain/maintain standing balance. Stood several times to assess blood pressure, when stepping to recliner patient demonstrating increased difficulty motor planning and progressing R LE towards chair (overall requiring total assist with R LE).    Ambulation/Gait               General Gait Details: Unable to progress to gait training at this time.   Stairs             Wheelchair Mobility    Modified Rankin (Stroke Patients Only) Modified Rankin (Stroke Patients Only) Pre-Morbid Rankin Score: Slight disability Modified Rankin: Moderately severe disability  Balance Overall balance assessment: Needs assistance Sitting-balance support: No upper extremity  supported, Feet supported Sitting balance-Leahy Scale: Fair Sitting balance - Comments: min guard statically up to mod assist dynamically with L lateral/posterior lean during LB ADLs   Standing balance support: Bilateral upper extremity supported, During functional activity Standing balance-Leahy Scale: Zero Standing balance comment: UP to max assist required at end of transfer to the chair.                            Cognition Arousal/Alertness: Awake/alert Behavior During Therapy: Flat affect Overall Cognitive Status: History of cognitive impairments - at baseline                                 General Comments: pt with hx of cognitive deficits, he requires increased time to follow commands and sequence (increased even more with hypotension).  Pleasant throughout session.        Exercises      General Comments General comments (skin integrity, edema, etc.): see clinical impression for BP      Pertinent Vitals/Pain Pain Assessment Pain Assessment: No/denies pain    Home Living   Living Arrangements: Spouse/significant other Available Help at Discharge: Family;Available 24 hours/day Type of Home: House Home Access: Stairs to enter Entrance Stairs-Rails: Right Entrance Stairs-Number of Steps: 2   Home Layout: Able to live on main level with bedroom/bathroom        Prior Function            PT Goals (current goals can now be found in the care plan section) Acute Rehab PT Goals Patient Stated Goal: None stated - wife wants pt to return home PT Goal Formulation: With patient/family Time For Goal Achievement: 12/21/21 Potential to Achieve Goals: Good Progress towards PT goals: Progressing toward goals    Frequency    Min 4X/week      PT Plan Current plan remains appropriate    Co-evaluation PT/OT/SLP Co-Evaluation/Treatment: Yes Reason for Co-Treatment: Complexity of the patient's impairments (multi-system involvement);Necessary  to address cognition/behavior during functional activity;For patient/therapist safety;To address functional/ADL transfers PT goals addressed during session: Mobility/safety with mobility;Balance;Proper use of DME OT goals addressed during session: ADL's and self-care      AM-PAC PT "6 Clicks" Mobility   Outcome Measure  Help needed turning from your back to your side while in a flat bed without using bedrails?: A Little Help needed moving from lying on your back to sitting on the side of a flat bed without using bedrails?: A Little Help needed moving to and from a bed to a chair (including a wheelchair)?: A Lot Help needed standing up from a chair using your arms (e.g., wheelchair or bedside chair)?: A Lot Help needed to walk in hospital room?: Total Help needed climbing 3-5 steps with a railing? : Total 6 Click Score: 12    End of Session Equipment Utilized During Treatment: Gait belt Activity Tolerance: Other (comment) (limited by orthostatic hypotension) Patient left: in chair;with call bell/phone within reach;with chair alarm set;with family/visitor present Nurse Communication: Mobility status (BP status) PT Visit Diagnosis: Unsteadiness on feet (R26.81);Other symptoms and signs involving the nervous system (R29.898)     Time: 9518-8416 PT Time Calculation (min) (ACUTE ONLY): 34 min  Charges:  $Gait Training: 8-22 mins  Patrick [name], PT, DPT Acute Rehabilitation Services Secure Chat Preferred Office: [phone]    Patrick [name] 12/08/2021, 11:30 AM

## 2021-12-08 NOTE — Consult Note (Signed)
Palliative Care Consult Note                                  Date: 12/08/2021   Patient Name: Patrick [name]  DOB: 1926/12/19  MRN: [id number]  Age / Sex: 88 y.o., male  PCP: Cleatis Polka., MD Referring Physician: Lorin Glass, MD  Reason for Consultation: Establishing goals of care  HPI/Patient Profile: 88 y.o. male  with past medical history of CAD, CKD stage IIIa, chronic thrombocytopenia, hypertension, hyperlipidemia, hypothyroidism, BPH, GERD, gout, dementia, and depression.  He was recently admitted 11/07/2021 through 11/09/2021 for small ICH in the setting of thrombocytopenia.  Imaging also revealed severe stenosis in the posterior circulation but he is not a candidate for antiplatelet agents due to ICH.  He presented to [facility] ED on 12/05/2021 after injuring his head in the setting of another fall.  He was seen by neurosurgery and they did not feel that he needed surgical intervention.  He was felt to be dehydrated and was given IV fluids. He was admitted to [facility] with a subarachnoid hemorrhage in the setting of chronic thrombocytopenia and recent falls.  Past Medical History:  Diagnosis Date   Bilateral foot-drop 12/17/2019   BPH (benign prostatic hypertrophy)    Diverticulosis    ED (erectile dysfunction)    Gait abnormality 12/17/2019   GERD (gastroesophageal reflux disease)    Gout    Hepatitis    hx hepatitis after mono as teenager   History of kidney stones    History of skin cancer    Hyperlipidemia    Hypertension    Hypothyroidism    IHD (ischemic heart disease)    Remote CABG in 1997   Memory disorder 12/17/2019   MI, acute, non ST segment elevation ([name]) 2010   s/p cath with occluded SVG to PD that fills by collaterals and the remainder of his revasculsarization is satisfactory. "the first time they did the stent , the second time they did the graft 8 vessels"   Nocturia    Thrombocytopenia ([name]) 11/19/2018   Tremor,  essential 12/17/2019   Urinary frequency     Subjective:   I have reviewed medical records including EPIC notes, labs and imaging, and assessed the patient at bedside.  He is alert and out of bed to the recliner.  He has no acute complaints.  I met with patient and his wife Meriam Sprague to discuss diagnosis, prognosis, GOC, EOL wishes, disposition, and options.  I introduced Palliative Medicine as specialized medical care for people living with serious illness. It focuses on providing relief from the symptoms and stress of a serious illness.   Created space and opportunity for patient and family to explore thoughts and feelings regarding current medical situation. Values and goals of care important to patient and family were attempted to be elicited.  A discussion was had today regarding advanced directives. Concepts specific to code status, artifical feeding and hydration, continued IV antibiotics and rehospitalization was had.  The MOST form was introduced and discussed.  Questions and concerns addressed. Patient/family encouraged to call with questions or concerns.     Life Review: Dennie Bible was in the [employer] and served in the [place] [employer].  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  He is close friends with famous golf for [employer].  He was an  avid golfer for many years, but does not play any longer.  He and [name] have been married for quite some time, although this was a second marriage for both of them.  They both have children from previous marriage.  Functional Status: Fraser Din and [name] live together in their home in [place].  At baseline, Fraser Din is ambulatory although has had difficulty with lower extremity neuropathy.  [name] feels this is what has resulted in frequent falls.  Fraser Din is independent with ADLs and has continued to be active, although [name] does believe he has recently started to "slow  down".  Goals: Rehab to improve functional status.  After rehab, goal is to return home and continue to enjoy life for as long as he can.  Additional Discussion: We discussed his current illness and what it means in the larger context of her ongoing co-morbidities. Discussed that Fraser Din has multiple medical problems including CAD, neuropathy, and hypertension.  Provided education on the natural trajectory of chronic illness, emphasizing that it is non-curable and progressive. Discussed that chronic illness results in decreased functional status over time, as patients do not usually return to previous baseline after having a major illness or injury.   Hospice and Palliative Care services outpatient were explained and offered. Discussion on the difference between Palliative and Hospice care was had per family request. Palliative care and hospice have similar goals of managing symptoms, promoting comfort, improving quality of life, and maintaining a person's dignity. However, palliative care may be offered during any phase of a serious illness, while hospice care is usually offered when a person is expected to live for 6 months or less.  [name] is clear they are not interested in hospice services at this time.  We did discuss code status.  [name] states that Fraser Din has previously stated he would want DNR status, and has a "signed DNR".  However on admission, he stated he would want resuscitation efforts.  Provided education on evidenced based poor outcomes in similar hospitalized patients, as the cause of the arrest is likely associated with chronic/terminal disease rather than a reversible acute cardio-pulmonary event. I explained that DNR/DNI  is a protective measure to keep [place] from harming the patient in their last moments of life.  Beverly verbalizes understanding and plans to discuss this further with Fraser Din.     Review of Systems  Neurological:  Positive for weakness.    Objective:   Primary  Diagnoses: Present on Admission:  Coronary artery disease involving native coronary artery of native heart without angina pectoris  Hypertensive urgency  Thrombocytopenia ([name])  Stage 3a chronic kidney disease (CKD) ([place])  Hypothyroidism   Physical Exam Vitals reviewed.  Constitutional:      General: He is not in acute distress. Pulmonary:     Effort: Pulmonary effort is normal.  Neurological:     Mental Status: He is alert and oriented to person, place, and time.     Motor: Weakness present.  Psychiatric:        Cognition and Memory: Cognition is impaired.     Vital Signs:  BP (!) 154/67 (BP Location: Right Arm)   Pulse 78   Temp 98.6 F (37 C) (Oral)   Resp 20   Ht 6' (1.829 m)   Wt 68.9 kg   SpO2 100%   BMI 20.61 kg/m   Palliative Assessment/Data: PPS 50-60%     Assessment & Plan:   SUMMARY OF RECOMMENDATIONS   Full code for now - wife states that  patient has previously wanted DNR/DNI status and plans to discuss this further with him Continue all current interventions Patient and wife are hopeful for improvement Plan is for CIR with goal to return home after PMT will continue to follow  Primary Decision Maker: Patient needs support from his wife for any major decisions  Prognosis:  Unable to determine  Discharge Planning:  Acute inpatient rehab (CIR)    Thank you for allowing [place] to participate in the care of [place] H Dull  MDM - High  Signed by: Elie Confer, NP Palliative Medicine Team  Team Phone # [phone]  For individual providers, please see AMION

## 2021-12-08 NOTE — Progress Notes (Signed)
PROGRESS NOTE  Patrick Rica H Dieudonne  DOB: 02/05/1927  PCP: Ginger Organ., MD GYB:[id number]  DOA: 12/05/2021  LOS: 1 day  Hospital Day: 4  Brief narrative: Patrick [name] is a 88 y.o. male with PMH significant for dementia, HTN, HLD, CAD, CKD 3, chronic thrombocytopenia, hypothyroidism, GERD, BPH, gout, chronic pain, depression who was recently admitted 6/26 to 6/28 for small ICH in the setting of thrombocytopenia.  At the time imaging also showed severe stenosis in the posterior circulation patient was not a candidate for antiplatelet agent due to [place]. 7/24, patient was brought to the ED for head injury due to multiple falls.  CT head showed trace foci of subarachnoid hemorrhage along the right frontal lobe without mass effect. Neurosurgery Dr. Annette Stable was consulted.  No surgical intervention ordered repeat follow-up imaging was recommended.  In the ED, patient had blood pressure elevated over 876 systolic and was given IV labetalol. Labs mostly unremarkable except platelet low at 100. Urine did not show signs of infection. Because of concern of poor oral hydration, frequent falls and injury, and significantly elevated blood pressure patient was kept in observation to hospitalist service.  Subjective: Patient was seen and examined this morning. Sitting up in chair.  Not in distress. Continues to have orthostatic blood pressure drop.  This morning from supine to sitting to standing, his blood pressure dropped from 190/88-135/70-85/52.  He felt dizzy, did not pass out.  He recovered after sitting on the chair for 2 minutes.   Assessment and plan: Small subarachnoid hemorrhage in the setting of chronic thrombocytopenia and recent falls -Patient with history of chronic thrombocytopenia, frequent falls, recent [place]  -Imaging in the ED showed new small SAH.  In the absence of mass effect, no need of intervention or repeat imaging for neurosurgery.    Hypertensive urgency Orthostatic  hypotension -Continues to remain orthostatic.  Looks euvolemic and adequately hydrated.  -Last admission, he was started on Florinef after which his blood pressure consistently remained elevated over 200.  Patient wife wants to avoid Florinef at this time.  He is not on any blood thinner or diuretics -I will start him on abdominal binder and I have to TED hose thigh-high.  -Continue to monitor for next 24 hours.  -Continue hydralazine as needed.  Frequent falls -In the setting of orthostatic hypotension, peripheral neuropathy -PT OT eval.  Fall precautions  Chronic thrombocytopenia -Low but stable.   Recent Labs  Lab 12/05/21 1425 12/05/21 1630 12/07/21 0335  PLT 100* 106* 92*    Dementia with behavioral symptoms Depression -PTA, patient was not Namenda, Depakote, Zoloft, Remeron, Lyrica and Cymbalta -[name] also believes that he may be getting too many medications.  As discussed with her I decided to stop Cymbalta and Zoloft.  CAD -Not on antiplatelets because of falls.  Continue Zetia  CKD 3a -Renal function is stable Recent Labs    03/16/21 1155 06/28/21 1750 09/25/21 1650 11/07/21 1731 11/08/21 0250 11/09/21 0144 12/05/21 1425 12/05/21 1630  BUN 19 21 28* '21 20 20 23 22  '$ CREATININE 1.20 1.27* 1.58* 1.29* 1.18 1.06 1.33* 1.22    Peripheral neuropathy -Continue Lyrica   Hypothyroidism -Continue Synthroid   BPH -Continue finasteride.  I would avoid Flomax because of significant orthostatic hypotension   GERD -Continue Protonix   Gout -Continue allopurinol   Goals of care   Code Status: Full Code    Mobility: Pending PT eval  Skin assessment:     Nutritional status:  Body  mass index is 20.61 kg/m.          Diet:  Diet Order             Diet Heart Room service appropriate? Yes; Fluid consistency: Thin  Diet effective now                   DVT prophylaxis:  Place TED hose Start: 12/08/21 0801 SCDs Start: 12/06/21  2248   Antimicrobials: None Fluid: None Consultants: None Family Communication: Wife and multiple other family members at bedside  Status is: Observation  Continue in-hospital care because: Pending blood pressure stabilization Level of care: Progressive   Dispo: The patient is from: Home              Anticipated d/c is to: Hopefully CIR post discharge              Patient currently is not medically stable to d/c.   Difficult to place patient No     Infusions:     Scheduled Meds:  allopurinol  100 mg Oral Daily   divalproex  250 mg Oral BID   ezetimibe  10 mg Oral Daily   finasteride  5 mg Oral Daily   levothyroxine  112 mcg Oral Q0600   lidocaine  1 patch Transdermal Q24H   memantine  10 mg Oral QHS   mirtazapine  15 mg Oral QHS   pantoprazole  40 mg Oral QAC breakfast   pregabalin  75 mg Oral BID    PRN meds: acetaminophen **OR** acetaminophen, hydrALAZINE, labetalol, oxyCODONE-acetaminophen   Antimicrobials: Anti-infectives (From admission, onward)    None       Objective: Vitals:   12/08/21 0801 12/08/21 1123  BP: (!) 168/82 (!) 154/67  Pulse: 78   Resp: 20   Temp: 98.1 F (36.7 C) 98.6 F (37 C)  SpO2: 100% 100%    Intake/Output Summary (Last 24 hours) at 12/08/2021 1406 Last data filed at 12/07/2021 1800 Gross per 24 hour  Intake --  Output 500 ml  Net -500 ml    Filed Weights   12/05/21 1618  Weight: 68.9 kg   Weight change:  Body mass index is 20.61 kg/m.   Physical Exam: General exam: Pleasant, elderly Caucasian male.  Not in physical distress Skin: No rashes, lesions or ulcers. HEENT: Atraumatic, normocephalic, no obvious bleeding Lungs: Clear to auscultation bilaterally CVS: Regular rate and rhythm, no murmur GI/Abd soft, nontender, nondistended, bowel sound present CNS: Alert, awake, oriented to place and person Psychiatry: Mood appropriate Extremities: No pedal edema, no calf tenderness  Data Review: I have personally  reviewed the laboratory data and studies available.  F/u labs ordered Unresulted Labs (From admission, onward)    None       Signed, Terrilee Croak, MD [facility] 12/08/2021

## 2021-12-09 ENCOUNTER — Encounter (HOSPITAL_COMMUNITY): Payer: Self-pay | Admitting: Internal Medicine

## 2021-12-09 ENCOUNTER — Other Ambulatory Visit: Payer: Self-pay

## 2021-12-09 ENCOUNTER — Inpatient Hospital Stay (HOSPITAL_COMMUNITY)
Admission: RE | Admit: 2021-12-09 | Discharge: 2021-12-22 | DRG: 945 | Disposition: A | Discharge status: Home Health | Payer: Medicare Other | Source: Intra-hospital | Attending: Physical Medicine and Rehabilitation | Admitting: Physical Medicine and Rehabilitation

## 2021-12-09 ENCOUNTER — Encounter (HOSPITAL_COMMUNITY): Payer: Self-pay | Admitting: Physical Medicine and Rehabilitation

## 2021-12-09 DIAGNOSIS — M21371 Foot drop, right foot: Secondary | ICD-10-CM | POA: Diagnosis present

## 2021-12-09 DIAGNOSIS — E785 Hyperlipidemia, unspecified: Secondary | ICD-10-CM | POA: Diagnosis present

## 2021-12-09 DIAGNOSIS — R296 Repeated falls: Secondary | ICD-10-CM | POA: Diagnosis present

## 2021-12-09 DIAGNOSIS — Z79899 Other long term (current) drug therapy: Secondary | ICD-10-CM

## 2021-12-09 DIAGNOSIS — G8929 Other chronic pain: Secondary | ICD-10-CM | POA: Diagnosis present

## 2021-12-09 DIAGNOSIS — G629 Polyneuropathy, unspecified: Secondary | ICD-10-CM | POA: Diagnosis present

## 2021-12-09 DIAGNOSIS — G250 Essential tremor: Secondary | ICD-10-CM | POA: Diagnosis not present

## 2021-12-09 DIAGNOSIS — F0390 Unspecified dementia without behavioral disturbance: Secondary | ICD-10-CM | POA: Diagnosis present

## 2021-12-09 DIAGNOSIS — B379 Candidiasis, unspecified: Secondary | ICD-10-CM | POA: Diagnosis not present

## 2021-12-09 DIAGNOSIS — I129 Hypertensive chronic kidney disease with stage 1 through stage 4 chronic kidney disease, or unspecified chronic kidney disease: Secondary | ICD-10-CM | POA: Diagnosis present

## 2021-12-09 DIAGNOSIS — G9608 Other cranial cerebrospinal fluid leak: Secondary | ICD-10-CM | POA: Diagnosis present

## 2021-12-09 DIAGNOSIS — T18128D Food in esophagus causing other injury, subsequent encounter: Secondary | ICD-10-CM | POA: Diagnosis not present

## 2021-12-09 DIAGNOSIS — D693 Immune thrombocytopenic purpura: Secondary | ICD-10-CM | POA: Diagnosis present

## 2021-12-09 DIAGNOSIS — I619 Nontraumatic intracerebral hemorrhage, unspecified: Secondary | ICD-10-CM | POA: Diagnosis not present

## 2021-12-09 DIAGNOSIS — I2510 Atherosclerotic heart disease of native coronary artery without angina pectoris: Secondary | ICD-10-CM | POA: Diagnosis present

## 2021-12-09 DIAGNOSIS — E440 Moderate protein-calorie malnutrition: Secondary | ICD-10-CM | POA: Diagnosis present

## 2021-12-09 DIAGNOSIS — N398 Other specified disorders of urinary system: Secondary | ICD-10-CM

## 2021-12-09 DIAGNOSIS — R300 Dysuria: Secondary | ICD-10-CM | POA: Diagnosis present

## 2021-12-09 DIAGNOSIS — W19XXXD Unspecified fall, subsequent encounter: Secondary | ICD-10-CM | POA: Diagnosis present

## 2021-12-09 DIAGNOSIS — Z79890 Hormone replacement therapy (postmenopausal): Secondary | ICD-10-CM

## 2021-12-09 DIAGNOSIS — K5903 Drug induced constipation: Secondary | ICD-10-CM | POA: Diagnosis not present

## 2021-12-09 DIAGNOSIS — N1832 Chronic kidney disease, stage 3b: Secondary | ICD-10-CM | POA: Diagnosis not present

## 2021-12-09 DIAGNOSIS — I252 Old myocardial infarction: Secondary | ICD-10-CM

## 2021-12-09 DIAGNOSIS — M545 Low back pain, unspecified: Secondary | ICD-10-CM | POA: Diagnosis present

## 2021-12-09 DIAGNOSIS — S066X9S Traumatic subarachnoid hemorrhage with loss of consciousness of unspecified duration, sequela: Secondary | ICD-10-CM | POA: Diagnosis not present

## 2021-12-09 DIAGNOSIS — R32 Unspecified urinary incontinence: Secondary | ICD-10-CM | POA: Diagnosis present

## 2021-12-09 DIAGNOSIS — N179 Acute kidney failure, unspecified: Secondary | ICD-10-CM | POA: Diagnosis present

## 2021-12-09 DIAGNOSIS — S065XAD Traumatic subdural hemorrhage with loss of consciousness status unknown, subsequent encounter: Secondary | ICD-10-CM

## 2021-12-09 DIAGNOSIS — R7989 Other specified abnormal findings of blood chemistry: Secondary | ICD-10-CM | POA: Diagnosis not present

## 2021-12-09 DIAGNOSIS — Z681 Body mass index (BMI) 19 or less, adult: Secondary | ICD-10-CM | POA: Diagnosis not present

## 2021-12-09 DIAGNOSIS — E782 Mixed hyperlipidemia: Secondary | ICD-10-CM

## 2021-12-09 DIAGNOSIS — Z951 Presence of aortocoronary bypass graft: Secondary | ICD-10-CM | POA: Diagnosis not present

## 2021-12-09 DIAGNOSIS — M21372 Foot drop, left foot: Secondary | ICD-10-CM | POA: Diagnosis present

## 2021-12-09 DIAGNOSIS — K219 Gastro-esophageal reflux disease without esophagitis: Secondary | ICD-10-CM | POA: Diagnosis present

## 2021-12-09 DIAGNOSIS — M109 Gout, unspecified: Secondary | ICD-10-CM | POA: Diagnosis not present

## 2021-12-09 DIAGNOSIS — S066X9D Traumatic subarachnoid hemorrhage with loss of consciousness of unspecified duration, subsequent encounter: Secondary | ICD-10-CM | POA: Diagnosis not present

## 2021-12-09 DIAGNOSIS — Z87891 Personal history of nicotine dependence: Secondary | ICD-10-CM

## 2021-12-09 DIAGNOSIS — N390 Urinary tract infection, site not specified: Secondary | ICD-10-CM | POA: Diagnosis present

## 2021-12-09 DIAGNOSIS — I6200 Nontraumatic subdural hemorrhage, unspecified: Secondary | ICD-10-CM | POA: Diagnosis not present

## 2021-12-09 DIAGNOSIS — N1831 Chronic kidney disease, stage 3a: Secondary | ICD-10-CM | POA: Diagnosis present

## 2021-12-09 DIAGNOSIS — B957 Other staphylococcus as the cause of diseases classified elsewhere: Secondary | ICD-10-CM | POA: Diagnosis present

## 2021-12-09 DIAGNOSIS — R3915 Urgency of urination: Secondary | ICD-10-CM | POA: Diagnosis not present

## 2021-12-09 DIAGNOSIS — I160 Hypertensive urgency: Secondary | ICD-10-CM | POA: Diagnosis not present

## 2021-12-09 DIAGNOSIS — D696 Thrombocytopenia, unspecified: Secondary | ICD-10-CM | POA: Diagnosis not present

## 2021-12-09 DIAGNOSIS — F411 Generalized anxiety disorder: Secondary | ICD-10-CM | POA: Diagnosis not present

## 2021-12-09 DIAGNOSIS — N189 Chronic kidney disease, unspecified: Secondary | ICD-10-CM | POA: Diagnosis not present

## 2021-12-09 DIAGNOSIS — N529 Male erectile dysfunction, unspecified: Secondary | ICD-10-CM | POA: Diagnosis not present

## 2021-12-09 DIAGNOSIS — G9340 Encephalopathy, unspecified: Secondary | ICD-10-CM | POA: Diagnosis not present

## 2021-12-09 DIAGNOSIS — Z955 Presence of coronary angioplasty implant and graft: Secondary | ICD-10-CM

## 2021-12-09 DIAGNOSIS — R4781 Slurred speech: Secondary | ICD-10-CM | POA: Diagnosis present

## 2021-12-09 DIAGNOSIS — Z888 Allergy status to other drugs, medicaments and biological substances status: Secondary | ICD-10-CM

## 2021-12-09 DIAGNOSIS — R413 Other amnesia: Secondary | ICD-10-CM | POA: Diagnosis not present

## 2021-12-09 DIAGNOSIS — E039 Hypothyroidism, unspecified: Secondary | ICD-10-CM | POA: Diagnosis present

## 2021-12-09 DIAGNOSIS — N400 Enlarged prostate without lower urinary tract symptoms: Secondary | ICD-10-CM | POA: Diagnosis present

## 2021-12-09 DIAGNOSIS — G4700 Insomnia, unspecified: Secondary | ICD-10-CM | POA: Diagnosis not present

## 2021-12-09 DIAGNOSIS — S066XAS Traumatic subarachnoid hemorrhage with loss of consciousness status unknown, sequela: Secondary | ICD-10-CM | POA: Diagnosis not present

## 2021-12-09 DIAGNOSIS — M549 Dorsalgia, unspecified: Secondary | ICD-10-CM | POA: Diagnosis present

## 2021-12-09 DIAGNOSIS — R29818 Other symptoms and signs involving the nervous system: Secondary | ICD-10-CM | POA: Diagnosis not present

## 2021-12-09 DIAGNOSIS — Z8619 Personal history of other infectious and parasitic diseases: Secondary | ICD-10-CM | POA: Diagnosis not present

## 2021-12-09 DIAGNOSIS — H5461 Unqualified visual loss, right eye, normal vision left eye: Secondary | ICD-10-CM | POA: Diagnosis present

## 2021-12-09 DIAGNOSIS — R000 Tachycardia, unspecified: Secondary | ICD-10-CM | POA: Diagnosis not present

## 2021-12-09 DIAGNOSIS — E538 Deficiency of other specified B group vitamins: Secondary | ICD-10-CM | POA: Diagnosis present

## 2021-12-09 DIAGNOSIS — Z741 Need for assistance with personal care: Secondary | ICD-10-CM | POA: Diagnosis present

## 2021-12-09 DIAGNOSIS — Z85828 Personal history of other malignant neoplasm of skin: Secondary | ICD-10-CM

## 2021-12-09 DIAGNOSIS — K5790 Diverticulosis of intestine, part unspecified, without perforation or abscess without bleeding: Secondary | ICD-10-CM | POA: Diagnosis not present

## 2021-12-09 DIAGNOSIS — R531 Weakness: Secondary | ICD-10-CM | POA: Diagnosis present

## 2021-12-09 DIAGNOSIS — E876 Hypokalemia: Secondary | ICD-10-CM | POA: Diagnosis not present

## 2021-12-09 DIAGNOSIS — I951 Orthostatic hypotension: Secondary | ICD-10-CM | POA: Diagnosis present

## 2021-12-09 DIAGNOSIS — K5900 Constipation, unspecified: Secondary | ICD-10-CM

## 2021-12-09 DIAGNOSIS — I629 Nontraumatic intracranial hemorrhage, unspecified: Secondary | ICD-10-CM | POA: Diagnosis not present

## 2021-12-09 DIAGNOSIS — Z8249 Family history of ischemic heart disease and other diseases of the circulatory system: Secondary | ICD-10-CM

## 2021-12-09 DIAGNOSIS — F064 Anxiety disorder due to known physiological condition: Secondary | ICD-10-CM

## 2021-12-09 DIAGNOSIS — D181 Lymphangioma, any site: Secondary | ICD-10-CM | POA: Diagnosis not present

## 2021-12-09 DIAGNOSIS — Z87442 Personal history of urinary calculi: Secondary | ICD-10-CM

## 2021-12-09 DIAGNOSIS — R269 Unspecified abnormalities of gait and mobility: Secondary | ICD-10-CM

## 2021-12-09 LAB — URINALYSIS, ROUTINE W REFLEX MICROSCOPIC
Bacteria, UA: NONE SEEN
Bilirubin Urine: NEGATIVE
Glucose, UA: NEGATIVE mg/dL
Ketones, ur: NEGATIVE mg/dL
Nitrite: NEGATIVE
Protein, ur: NEGATIVE mg/dL
Specific Gravity, Urine: 1.018 (ref 1.005–1.030)
pH: 5 (ref 5.0–8.0)

## 2021-12-09 MED ORDER — DIVALPROEX SODIUM 250 MG PO DR TAB
250.0000 mg | DELAYED_RELEASE_TABLET | Freq: Two times a day (BID) | ORAL | Status: DC
  Administered 2021-12-09 – 2021-12-22 (×26): 250 mg via ORAL
  Filled 2021-12-09 (×26): qty 1

## 2021-12-09 MED ORDER — OXYCODONE-ACETAMINOPHEN 5-325 MG PO TABS
1.0000 | ORAL_TABLET | Freq: Four times a day (QID) | ORAL | 0 refills | Status: DC | PRN
Start: 2021-12-09 — End: 2022-01-11

## 2021-12-09 MED ORDER — LEVOTHYROXINE SODIUM 112 MCG PO TABS
112.0000 ug | ORAL_TABLET | Freq: Every day | ORAL | Status: DC
  Administered 2021-12-10 – 2021-12-22 (×13): 112 ug via ORAL
  Filled 2021-12-09 (×13): qty 1

## 2021-12-09 MED ORDER — FLUDROCORTISONE ACETATE 0.1 MG PO TABS
0.0500 mg | ORAL_TABLET | Freq: Every day | ORAL | Status: DC
  Administered 2021-12-09: 0.05 mg via ORAL
  Filled 2021-12-09: qty 0.5

## 2021-12-09 MED ORDER — FINASTERIDE 5 MG PO TABS
5.0000 mg | ORAL_TABLET | Freq: Every day | ORAL | Status: DC
  Administered 2021-12-10 – 2021-12-21 (×12): 5 mg via ORAL
  Filled 2021-12-09 (×12): qty 1

## 2021-12-09 MED ORDER — PHENAZOPYRIDINE HCL 200 MG PO TABS: 200.0000 mg | ORAL_TABLET | Freq: Three times a day (TID) | ORAL | Status: DC

## 2021-12-09 MED ORDER — OXYCODONE-ACETAMINOPHEN 5-325 MG PO TABS
1.0000 | ORAL_TABLET | Freq: Three times a day (TID) | ORAL | Status: DC | PRN
  Filled 2021-12-09: qty 1

## 2021-12-09 MED ORDER — ALUM & MAG HYDROXIDE-SIMETH 200-200-20 MG/5ML PO SUSP: 30.0000 mL | ORAL | Status: DC | PRN

## 2021-12-09 MED ORDER — CEPHALEXIN 500 MG PO CAPS: 500.0000 mg | ORAL_CAPSULE | Freq: Three times a day (TID) | ORAL | Status: DC

## 2021-12-09 MED ORDER — CEPHALEXIN 500 MG PO CAPS
500.0000 mg | ORAL_CAPSULE | Freq: Three times a day (TID) | ORAL | Status: DC
  Administered 2021-12-09: 500 mg via ORAL
  Filled 2021-12-09: qty 1

## 2021-12-09 MED ORDER — BISACODYL 10 MG RE SUPP
10.0000 mg | Freq: Every day | RECTAL | Status: DC | PRN
  Administered 2021-12-16 – 2021-12-19 (×2): 10 mg via RECTAL
  Filled 2021-12-09 (×2): qty 1

## 2021-12-09 MED ORDER — EZETIMIBE 10 MG PO TABS
10.0000 mg | ORAL_TABLET | Freq: Every day | ORAL | Status: DC
  Administered 2021-12-10 – 2021-12-22 (×13): 10 mg via ORAL
  Filled 2021-12-09 (×13): qty 1

## 2021-12-09 MED ORDER — FLUDROCORTISONE ACETATE 0.1 MG PO TABS
0.0500 mg | ORAL_TABLET | Freq: Every day | ORAL | Status: DC
  Administered 2021-12-10 – 2021-12-22 (×13): 0.05 mg via ORAL
  Filled 2021-12-09 (×14): qty 0.5

## 2021-12-09 MED ORDER — LIDOCAINE HCL URETHRAL/MUCOSAL 2 % EX GEL
CUTANEOUS | Status: DC | PRN
  Filled 2021-12-09 (×3): qty 6

## 2021-12-09 MED ORDER — POLYETHYLENE GLYCOL 3350 17 G PO PACK: 17.0000 g | PACK | Freq: Every day | ORAL | Status: DC | PRN

## 2021-12-09 MED ORDER — MEMANTINE HCL 10 MG PO TABS
10.0000 mg | ORAL_TABLET | Freq: Every day | ORAL | Status: DC
  Administered 2021-12-09 – 2021-12-21 (×13): 10 mg via ORAL
  Filled 2021-12-09 (×13): qty 1

## 2021-12-09 MED ORDER — DIPHENHYDRAMINE HCL 12.5 MG/5ML PO ELIX: 12.5000 mg | ORAL_SOLUTION | Freq: Four times a day (QID) | ORAL | Status: DC | PRN

## 2021-12-09 MED ORDER — ACETAMINOPHEN 325 MG PO TABS: 325.0000 mg | ORAL_TABLET | ORAL | Status: DC | PRN

## 2021-12-09 MED ORDER — PROCHLORPERAZINE MALEATE 5 MG PO TABS: 5.0000 mg | ORAL_TABLET | Freq: Four times a day (QID) | ORAL | Status: DC | PRN

## 2021-12-09 MED ORDER — FLUDROCORTISONE ACETATE 0.1 MG PO TABS: 0.0500 mg | ORAL_TABLET | Freq: Every day | ORAL | Status: DC

## 2021-12-09 MED ORDER — PROCHLORPERAZINE 25 MG RE SUPP: 12.5000 mg | Freq: Four times a day (QID) | RECTAL | Status: DC | PRN

## 2021-12-09 MED ORDER — MIRTAZAPINE 15 MG PO TABS
15.0000 mg | ORAL_TABLET | Freq: Every day | ORAL | Status: DC
  Administered 2021-12-09 – 2021-12-12 (×4): 15 mg via ORAL
  Filled 2021-12-09 (×4): qty 1

## 2021-12-09 MED ORDER — GUAIFENESIN-DM 100-10 MG/5ML PO SYRP: 5.0000 mL | ORAL_SOLUTION | Freq: Four times a day (QID) | ORAL | Status: DC | PRN

## 2021-12-09 MED ORDER — PROCHLORPERAZINE EDISYLATE 10 MG/2ML IJ SOLN: 5.0000 mg | Freq: Four times a day (QID) | INTRAMUSCULAR | Status: DC | PRN

## 2021-12-09 MED ORDER — FLEET ENEMA 7-19 GM/118ML RE ENEM: 1.0000 | ENEMA | Freq: Once | RECTAL | Status: DC | PRN

## 2021-12-09 MED ORDER — PANTOPRAZOLE SODIUM 40 MG PO TBEC
40.0000 mg | DELAYED_RELEASE_TABLET | Freq: Every day | ORAL | Status: DC
  Administered 2021-12-10 – 2021-12-22 (×13): 40 mg via ORAL
  Filled 2021-12-09 (×13): qty 1

## 2021-12-09 MED ORDER — ALLOPURINOL 100 MG PO TABS
100.0000 mg | ORAL_TABLET | Freq: Every day | ORAL | Status: DC
  Administered 2021-12-10 – 2021-12-12 (×3): 100 mg via ORAL
  Filled 2021-12-09 (×3): qty 1

## 2021-12-09 MED ORDER — OXYCODONE-ACETAMINOPHEN 5-325 MG PO TABS
1.0000 | ORAL_TABLET | Freq: Every day | ORAL | Status: DC
  Administered 2021-12-09 – 2021-12-21 (×5): 1 via ORAL
  Filled 2021-12-09 (×10): qty 1

## 2021-12-09 NOTE — TOC Transition Note (Signed)
Transition of Care [facility]) - CM/SW Discharge Note   Patient Details  Name: Patrick [name] MRN: [id number] Date of Birth: Jan 24, 1927  [facility] CM/SW Contact:  Pollie Friar, RN Phone Number: 12/09/2021, 2:29 PM   Clinical Narrative:    Pt is discharging to CIR today. CM signing off.    Final next level of care: IP Rehab Facility Barriers to Discharge: No Barriers Identified   Patient Goals and CMS Choice   CMS Medicare.gov Compare Post Acute Care list provided to:: Patient Choice offered to / list presented to : Patient  Discharge Placement                       Discharge Plan and Services     Post Acute Care Choice: IP Rehab                               Social Determinants of Health (SDOH) Interventions     Readmission Risk Interventions     No data to display

## 2021-12-09 NOTE — Progress Notes (Signed)
Occupational Therapy Treatment Patient Details Name: Patrick [name] MRN: [id number] DOB: 10-14-1926 Today's Date: 12/09/2021   History of present illness Pt is a 88 y.o. male transported to [facility] ED 03/08/2022 from outside hospital due to several recent falls and reports of hitting head during fall. CT revealed trace foci of subarachnoid hemorrhage along R frontal lobe not fully extended and interval development of an intermediate vague .4 cm L frontal hyperdensoty along gray-white matter junction.  Pt with recent admission 6/26 -6/28 with intracranial hemorrhage s/p a fall.  PMH significant for CAD, CKD stage IIIa, chronic thrombocytopenia, hypertension, hyperlipidemia, hypothyroidism, BPH, GERD, gout, chronic pain, dementia, depression.   OT comments  Pt still with significant impairments in balance, spatial orientation, orthostatic hypotension and coordination.  Mod +2 for toileting tasks with max +2 for transfers stand/step pivot to the bedside recliner.  Increased ataxia noted with impairments in motor planning in the RUE and RLE.  BP in standing with TEDs and binder on still decreasing into the 75I systolically and the 43-32'R diastolically.  Feel he will continue to benefit from acute care OT at this time with recommendation for more intense therapy at AIR level in order to progress to a level that the patient can return home with his spouse.     Recommendations for follow up therapy are one component of a multi-disciplinary discharge planning process, led by the attending physician.  Recommendations may be updated based on patient status, additional functional criteria and insurance authorization.    Follow Up Recommendations  Acute inpatient rehab (3hours/day)    Assistance Recommended at Discharge Frequent or constant Supervision/Assistance  Patient can return home with the following  A lot of help with bathing/dressing/bathroom;Assistance with cooking/housework;Assist for  transportation;Direct supervision/assist for financial management;Direct supervision/assist for medications management;Help with stairs or ramp for entrance;Two people to help with walking and/or transfers   Equipment Recommendations  Other (comment) (TBD next venue of care)       Precautions / Restrictions Precautions Precautions: Fall Precaution Comments: watch BP, orthostatic Restrictions Weight Bearing Restrictions: No       Mobility Bed Mobility Overal bed mobility: Needs Assistance Bed Mobility: Supine to Sit     Supine to sit: Min assist     General bed mobility comments: Mod demonstrational cueing needed to sequence bathing with min assist for sidelying to sit after rolling over.    Transfers Overall transfer level: Needs assistance Equipment used: None Transfers: Sit to/from Stand, Bed to chair/wheelchair/BSC Sit to Stand: +2 safety/equipment, Mod assist     Step pivot transfers: Max assist, +2 physical assistance, +2 safety/equipment     General transfer comment: Increased ataxia noted in standing as well as increased right knee flexion and external rotation requiring mod to max assist from therapist to try to achieve and maintain knee extension in weightbearing.     Balance Overall balance assessment: Needs assistance Sitting-balance support: No upper extremity supported, Feet supported Sitting balance-Leahy Scale: Poor Sitting balance - Comments: Pt with right lean and LOB frequently with static sitting.  Max assist to maintain balance with dynamic tasks such as dressing.   Standing balance support: Bilateral upper extremity supported, During functional activity Standing balance-Leahy Scale: Zero Standing balance comment: Pt needs +2 assist for safety with standing and transfers.                           ADL either performed or assessed with clinical judgement   ADL Overall  ADL's : Needs assistance/impaired                          Toilet Transfer: +2 for physical assistance;Stand-pivot;Maximal assistance Toilet Transfer Details (indicate cue type and reason): simulated to recliner, pt declined need to toilet and has condom catheter Toileting- Clothing Manipulation and Hygiene: Moderate assistance;+2 for physical assistance;Sit to/from stand       Functional mobility during ADLs: Maximal assistance;+2 for physical assistance General ADL Comments: Pt still with orthostatic hypotension in standing with TEDS and binder in place.  In supine BP at 152/74 and in sitting at 125/74-135/73.  With standing it would decreased down into the 52W systolically and in the 41-32 range diastolically in less than 2 mins of standing requiring the need to sit down.  Decreased RUE coordination with ataxia noted when attempting to hold and donn his gripper socks.               Cognition Arousal/Alertness: Awake/alert Behavior During Therapy: Flat affect Overall Cognitive Status: Impaired/Different from baseline Area of Impairment: Attention, Following commands, Problem solving                   Current Attention Level: Sustained   Following Commands: Follows one step commands with increased time     Problem Solving: Slow processing, Requires tactile cues, Difficulty sequencing General Comments: Pt with slower cognitive processing to instructions given at times and slower verbal response.                   Pertinent Vitals/ Pain       Pain Assessment Pain Assessment: Faces Faces Pain Scale: No hurt         Frequency  Min 2X/week        Progress Toward Goals  OT Goals(current goals can now be found in the care plan section)  Progress towards OT goals: Progressing toward goals  Acute Rehab OT Goals Patient Stated Goal: Pt wants to go to rehab OT Goal Formulation: With patient/family Time For Goal Achievement: 12/21/21 Potential to Achieve Goals: Good  Plan Discharge plan remains appropriate;Frequency  remains appropriate    Co-evaluation    PT/OT/SLP Co-Evaluation/Treatment: Yes Reason for Co-Treatment: To address functional/ADL transfers;For patient/therapist safety   OT goals addressed during session: ADL's and self-care      AM-PAC OT "6 Clicks" Daily Activity     Outcome Measure   Help from another person eating meals?: A Little Help from another person taking care of personal grooming?: A Little Help from another person toileting, which includes using toliet, bedpan, or urinal?: A Lot Help from another person bathing (including washing, rinsing, drying)?: A Lot Help from another person to put on and taking off regular upper body clothing?: A Little Help from another person to put on and taking off regular lower body clothing?: A Lot 6 Click Score: 15    End of Session Equipment Utilized During Treatment: Gait belt  OT Visit Diagnosis: Unsteadiness on feet (R26.81);Muscle weakness (generalized) (M62.81);History of falling (Z91.81);Dizziness and giddiness (R42);Cognitive communication deficit (R41.841);Hemiplegia and hemiparesis Symptoms and signs involving cognitive functions: Nontraumatic intracerebral hemorrhage Hemiplegia - Right/Left: Right Hemiplegia - dominant/non-dominant: Dominant   Activity Tolerance Other (comment) (limited by hypotension)   Patient Left with call bell/phone within reach;in chair;with chair alarm set;with family/visitor present   Nurse Communication Mobility status;Precautions;Other (comment) (hypotensive episodes in standing)        Time: 4401-0272 OT Time Calculation (min): 42  min  Charges: OT General Charges $OT Visit: 1 Visit OT Treatments $Self Care/Home Management : 8-22 mins  Ayomide Purdy OTR/L 12/09/2021, 12:09 PM

## 2021-12-09 NOTE — H&P (Signed)
Physical Medicine and Rehabilitation Admission H&P    Chief Complaint  Patient presents with   Functional deficits due to fall.     HPI:  Patrick [name] is a 88 year old male with history of HTN, peripheral neuropathy with bilateral foot drop, CAD s/p CABG/PTCA, dementia, Vitamin B12 deficiency, chronic LBP w/neurogenic claudication (Dr. Maia Petties), multiple falls (unable to tolerate outpatient PT due to orthostatic changes), seizure type activity after fall 5/23 and recent admission 11/07/04/28 for lethargy with  metabolic encephalopathy and slurred speech secondary to [place] in setting of thrombocytopenia. He was also found to have severe stenosis of distal BA and bilateral PCA. He was  discharged to home with HHPT/HHOT and referred to Dr. Marin Olp  07/24 who felt that patient with mild ITP. While there, wife reported lethargy with excessive sleep, poor intake as well as recurrent falls therefore was sent to ED for evaluation. CT head showed trace foci of SAH in right frontal, interval development of left frontal hypodensity along gray white matter junction as well as chronic bilateral hygromas. Per chart, NS was consulted and did not feel intervention or repeat CT head needed.  BP noted to be elevated was treated with IV labetalol initially.  Wife notes that he typically sleeps between 9pm and noon so may have a hard tim tolerating morning therapies.    Review of Systems  Constitutional:  Negative for chills and fever.  HENT:  Negative for hearing loss.   Eyes:  Negative for blurred vision.       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   Respiratory:  Negative for cough and shortness of breath.   Cardiovascular:  Negative for chest pain.  Genitourinary:  Positive for dysuria and frequency.  Musculoskeletal:  Positive for back pain.  Skin:  Negative for rash.  Neurological:  Negative for speech change, weakness and headaches.  Psychiatric/Behavioral:  Negative for substance  abuse.      Past Medical History:  Diagnosis Date   Bilateral foot-drop 12/17/2019   BPH (benign prostatic hypertrophy)    Diverticulosis    ED (erectile dysfunction)    Food impaction of esophagus w/mild gastritis 06/28/2021   Gait abnormality 12/17/2019   GERD (gastroesophageal reflux disease)    Gout    Hepatitis    hx hepatitis after mono as teenager   History of kidney stones    History of skin cancer    Hyperlipidemia    Hypertension    Hypothyroidism    IHD (ischemic heart disease)    Remote CABG in 1997   Memory disorder 12/17/2019   MI, acute, non ST segment elevation ([place]) 2010   s/p cath with occluded SVG to PD that fills by collaterals and the remainder of his revasculsarization is satisfactory. "the first time they did the stent , the second time they did the graft 8 vessels"   Nocturia    Schatzki's ring--mild    Thrombocytopenia ([place]) 11/19/2018   Tremor, essential 12/17/2019   Urinary frequency     Past Surgical History:  Procedure Laterality Date   APPENDECTOMY     BALLOON DILATION N/A 03/25/2019   Procedure: BALLOON DILATION;  Surgeon: Otis Brace, MD;  Location: [facility];  Service: Gastroenterology;  Laterality: N/A;   CARDIAC CATHETERIZATION  09/08/2008   NORMAL. EF 60%; Occluded SVG to PD that fills by collaterals and the remainder of his revascularization is satisfactory. he is managed medically.    [place]  CABG x 8   CORONARY STENT INTERVENTION N/A 11/18/2018   Procedure: CORONARY STENT INTERVENTION;  Surgeon: Sherren Mocha, MD;  Location: [place] [facility];  Service: Cardiovascular;  Laterality: N/A;   CYSTOSCOPY WITH INSERTION OF UROLIFT N/A 03/19/2015   Procedure: CYSTOSCOPY WITH INSERTION OF FOUR UROLIFTS;  Surgeon: Carolan Clines, MD;  Location: [facility];  Service: Urology;  Laterality: N/A;   ESOPHAGOGASTRODUODENOSCOPY (EGD) WITH PROPOFOL N/A 02/12/2017   Procedure: ESOPHAGOGASTRODUODENOSCOPY (EGD) WITH  PROPOFOL;  Surgeon: Otis Brace, MD;  Location: [facility];  Service: Gastroenterology;  Laterality: N/A;   ESOPHAGOGASTRODUODENOSCOPY (EGD) WITH PROPOFOL N/A 07/13/2018   Procedure: ESOPHAGOGASTRODUODENOSCOPY (EGD) WITH PROPOFOL;  Surgeon: Clarene Essex, MD;  Location: [facility];  Service: Endoscopy;  Laterality: N/A;   ESOPHAGOGASTRODUODENOSCOPY (EGD) WITH PROPOFOL N/A 03/25/2019   Procedure: ESOPHAGOGASTRODUODENOSCOPY (EGD) WITH PROPOFOL;  Surgeon: Otis Brace, MD;  Location: [facility];  Service: Gastroenterology;  Laterality: N/A;   ESOPHAGOGASTRODUODENOSCOPY (EGD) WITH PROPOFOL N/A 06/28/2021   Procedure: ESOPHAGOGASTRODUODENOSCOPY (EGD) WITH PROPOFOL;  Surgeon: Wilford Corner, MD;  Location: [facility];  Service: Endoscopy;  Laterality: N/A;   FOREIGN BODY REMOVAL N/A 07/13/2018   Procedure: FOREIGN BODY REMOVAL;  Surgeon: Clarene Essex, MD;  Location: [facility];  Service: Endoscopy;  Laterality: N/A;   IMPACTION REMOVAL  06/28/2021   Procedure: IMPACTION REMOVAL;  Surgeon: Wilford Corner, MD;  Location: [facility];  Service: Endoscopy;;   LEFT HEART CATH AND CORS/GRAFTS ANGIOGRAPHY N/A 11/18/2018   Procedure: LEFT HEART CATH AND CORS/GRAFTS ANGIOGRAPHY;  Surgeon: Sherren Mocha, MD;  Location: [place] [facility];  Service: Cardiovascular;  Laterality: N/A;    Family History  Problem Relation Age of Onset   Heart failure Mother 41    Social History: Married. Retired from [employer]. He  reports that he quit smoking about 63 years ago. His smoking use included cigarettes. He has a 15.00 pack-year smoking history. He has never used smokeless tobacco. He reports current alcohol use--occasional beer. He reports that he does not use drugs.   Allergies  Allergen Reactions   Pravachol Other (See Comments)    Unknown   Triazolam Other (See Comments)    "Makes me crazy"    Medications Prior to Admission  Medication Sig Dispense Refill   allopurinol (ZYLOPRIM) 100 MG  tablet Take 100 mg by mouth daily.     cephALEXin (KEFLEX) 500 MG capsule Take 1 capsule (500 mg total) by mouth every 8 (eight) hours for 3 days.     diphenhydramine-acetaminophen (TYLENOL PM) 25-500 MG TABS tablet Take 1 tablet by mouth at bedtime as needed (sleep).     divalproex (DEPAKOTE) 250 MG DR tablet Take 250 mg by mouth 2 (two) times daily.     ezetimibe (ZETIA) 10 MG tablet TAKE 1 TABLET BY MOUTH EVERY DAY (Patient taking differently: Take 10 mg by mouth daily.) 90 tablet 1   finasteride (PROSCAR) 5 MG tablet Take 5 mg by mouth daily.     [START ON 12/10/2021] fludrocortisone (FLORINEF) 0.1 MG tablet Take 0.5 tablets (0.05 mg total) by mouth daily.     levothyroxine (SYNTHROID) 112 MCG tablet Take 112 mcg by mouth daily.     lidocaine (LIDODERM) 5 % Place 1 patch onto the skin daily.     memantine (NAMENDA) 10 MG tablet Take 1 tablet (10 mg total) by mouth 2 (two) times daily. (Patient taking differently: Take 10 mg by mouth at bedtime.) 60 tablet 11   mirtazapine (REMERON) 15 MG tablet Take 15 mg by mouth at  bedtime.     nitroGLYCERIN (NITROSTAT) 0.4 MG SL tablet Place 1 tablet (0.4 mg total) under the tongue every 5 (five) minutes as needed. (Patient taking differently: Place 0.4 mg under the tongue every 5 (five) minutes as needed for chest pain.) 75 tablet 2   oxyCODONE-acetaminophen (PERCOCET/ROXICET) 5-325 MG tablet Take 1 tablet by mouth every 6 (six) hours as needed for severe pain. 30 tablet 0   pantoprazole (PROTONIX) 40 MG tablet Take 1 tablet (40 mg total) by mouth daily before breakfast. 90 tablet 1   Polyethyl Glycol-Propyl Glycol (SYSTANE OP) Place 1 drop into both eyes 2 (two) times daily as needed (dry eyes).     Vitamin D, Ergocalciferol, (DRISDOL) 50000 units CAPS capsule Take 50,000 Units by mouth every Monday.  3    There were no vitals taken for this visit. Physical Exam Gen: no distress, normal appearing HEENT: oral mucosa pink and moist, NCAT Cardio: Reg  rate Chest: normal effort, normal rate of breathing Abd: soft, non-distended Ext: no edema Psych: pleasant, normal affect, joking Skin: intact Neuro: Alert. MMT intact.   Results for orders placed or performed during the hospital encounter of 12/05/21 (from the past 48 hour(s))  Urinalysis, Routine w reflex microscopic Urine, Clean Catch     Status: Abnormal   Collection Time: 12/08/21  9:30 PM  Result Value Ref Range   Color, Urine YELLOW YELLOW   APPearance CLEAR CLEAR   Specific Gravity, Urine 1.017 1.005 - 1.030   pH 5.0 5.0 - 8.0   Glucose, UA NEGATIVE NEGATIVE mg/dL   Hgb urine dipstick SMALL (A) NEGATIVE   Bilirubin Urine NEGATIVE NEGATIVE   Ketones, ur NEGATIVE NEGATIVE mg/dL   Protein, ur NEGATIVE NEGATIVE mg/dL   Nitrite NEGATIVE NEGATIVE   Leukocytes,Ua NEGATIVE NEGATIVE   RBC / HPF 21-50 0 - 5 RBC/hpf   WBC, UA 0-5 0 - 5 WBC/hpf   Bacteria, UA NONE SEEN NONE SEEN   Squamous Epithelial / LPF 0-5 0 - 5   Mucus PRESENT     Comment: Performed at [facility], [place] [address] [place], [place] [address]   No results found.    There were no vitals taken for this visit.  Medical Problem List and Plan: 1. Functional deficits secondary to [place]  -patient may shower  -ELOS/Goals: 3 weeks  Admit to CIR 2.  Antithrombotics: -DVT/anticoagulation:  Mechanical: Sequential compression devices, below knee Bilateral lower extremities  -antiplatelet therapy: N/A 3. Spinal pain: On depakote for neuropathy (per chart review).  --Used oxycodone 10/325 mg 2-3 times a day.  -scheduling oxycodone at night 4. Mood/Behavior/Sleep: LCSW to follow for evaluation and support.   --Schedule oxycodone 5/325 mg/Hs for pain/sleep.   -antipsychotic agents: N/A 5. Neuropsych/cognition: This patient is not capable of making decisions on his own behalf. 6. Skin/Wound Care: Routine pressure relief measures.  7. Fluids/Electrolytes/Nutrition: Monitor I/O. Encourage fluid intake.  8. HTN  w/significant orthostatic changes: Monitor BP TID w/orthostatic vitals  --orthostatic changes have been ongoing for years per records --Continue Proscar, change to bedtime. Change florinef (added 07/28) to 7 am prior to therapy. --Encourage fluid intake.  Binder/TEDs daily when out of bed.  9. CKD?: Will check CMET in am.  10. Mild ITP/Thrombocytopenia: Repeat CBC in am to monitor platelets.   --Monitor for signs of bleeding.  11. CAD: Monitor for symptoms with increase in activity. Has been on florinef in the past.   12. H/o Food impaction/gastritis: Intake has been poor.  --Will  order supervision for safety with meals/to assist w/meals.  13. Urgency/Dysuria: UA negative and was started on Keflex today, will d/c   --will recheck UA/UCS. Encourage fluid intake  --monitor PVRs  I have personally performed a face to face diagnostic evaluation, including, but not limited to relevant history and physical exam findings, of this patient and developed relevant assessment and plan.  Additionally, I have reviewed and concur with the physician assistant's documentation above.  Bary Leriche, PA-C   Izora Ribas, MD 12/09/2021

## 2021-12-09 NOTE — Discharge Summary (Signed)
Physician Discharge Summary  Patrick Rica H Iacovelli TOI:[id number] DOB: 1926-09-27 DOA: 12/05/2021  PCP: Ginger Organ., MD  Admit date: 12/05/2021 Discharge date: 12/09/2021  Admitted From: Home Discharge disposition: [facility]   Brief narrative: Patrick [name] is a 88 y.o. male with PMH significant for dementia, HTN, HLD, CAD, CKD 3, chronic thrombocytopenia, hypothyroidism, GERD, BPH, gout, chronic pain, depression who was recently admitted 6/26 to 6/28 for small ICH in the setting of thrombocytopenia.  At the time imaging also showed severe stenosis in the posterior circulation patient was not a candidate for antiplatelet agent due to [place]. 7/24, patient was brought to the ED for head injury due to multiple falls.  CT head showed trace foci of subarachnoid hemorrhage along the right frontal lobe without mass effect. Neurosurgery Dr. Annette Stable was consulted.  No surgical intervention ordered repeat follow-up imaging was recommended.  In the ED, patient had blood pressure elevated over 833 systolic and was given IV labetalol. Labs mostly unremarkable except platelet low at 100. Urine did not show signs of infection. Because of concern of poor oral hydration, frequent falls and injury, and significantly elevated blood pressure patient was kept in observation to hospitalist service.  Subjective: Patient was seen and examined this morning. Sitting up in his recliner.  Not in distress.  No new symptoms.  Wife at bedside.  She states that he did not sleep well last night because of burning urination.  Got few hours of sleep this morning.  Seen by PT this morning again and continued to have orthostatic hypotension with dizziness.  Improved after sitting back on the chair. He has a bed available at [facility] today.  Assessment and plan: Small subarachnoid hemorrhage in the setting of chronic thrombocytopenia and recent falls -Patient with history of chronic thrombocytopenia, frequent falls, recent [place]   -Imaging in the ED showed new small SAH.  In the absence of mass effect, no need of intervention or repeat imaging for neurosurgery.    Hypertensive urgency Orthostatic hypotension -Patient has a history of hypertension as well as orthostatic hypotension.  In the past, he was on an ARB as well as Florinef and was followed by cardiologist Dr. Acie Fredrickson.  Both medicines were eventually taken off.  During his last hospitalization 6/26 to 6/28, Florinef was resumed at 0.1 mg daily.   -His blood pressure on arrival this admission was over 200.  Florinef was stopped.  Blood pressure improved but every time he stands up, his blood pressure drops to less than 90 and he gets dizzy.  He is currently not on any blood pressure medicine or AV nodal blocking agent or Flomax.  -I will start him back on Florinef half dose at 0.05 mg daily.  We will also place him on abdominal binder as well as TED hose thigh-high.  -Continue to monitor at the rehab.  Frequent falls -In the setting of orthostatic hypotension, peripheral neuropathy -PT OT eval.  Fall precautions.  CIR recommended  Burning urination -Patient comes of burning urination since last night.  Could not sleep a lot because of that.  Urinalysis negative.  Wife is concerned and states that he had similar symptoms in the past because of UTI and needed antibiotics despite negative culture report.  I would empirically start him on 3 days of oral Keflex.  Could not put him on Pyridium because of renal impairment.  Chronic thrombocytopenia -Low but stable.    Dementia with behavioral symptoms Depression Peripheral neuropathy -PTA, patient was not Namenda,  Depakote, Zoloft, Remeron, Lyrica and Cymbalta -[name] also believes that he may be getting too many medications.  As discussed with her, we decided to stop Cymbalta, Zoloft, Lyrica.  Okay to continue Namenda, Depakote and Remeron for now.  CAD -Not on antiplatelets because of falls.  Continue  Zetia  CKD 3a -Renal function is stable Recent Labs    03/16/21 1155 06/28/21 1750 09/25/21 1650 11/07/21 1731 11/08/21 0250 11/09/21 0144 12/05/21 1425 12/05/21 1630  BUN 19 21 28* '21 20 20 23 22  '$ CREATININE 1.20 1.27* 1.58* 1.29* 1.18 1.06 1.33* 1.22   Hypothyroidism -Continue Synthroid   BPH -Continue finasteride.  I would avoid Flomax because of significant orthostatic hypotension   GERD -Continue Protonix   Gout -Continue allopurinol    Wounds:  -    Discharge Exam:   Vitals:   12/08/21 2315 12/09/21 0312 12/09/21 0333 12/09/21 0732  BP: (!) 159/83 (!) 170/85 (!) 156/68 (!) 143/66  Pulse: 92 83  82  Resp: '18 18  16  '$ Temp: 97.7 F (36.5 C) 97.8 F (36.6 C)    TempSrc: Oral Oral    SpO2: 96% 97%    Weight:      Height:        Body mass index is 20.61 kg/m.   General exam: Pleasant, elderly Caucasian male.  Not in physical distress Skin: No rashes, lesions or ulcers. HEENT: Atraumatic, normocephalic, no obvious bleeding Lungs: Clear to auscultation bilaterally CVS: Regular rate and rhythm, no murmur GI/Abd soft, nontender, nondistended, bowel sound present CNS: Alert, awake, oriented to place and person Psychiatry: Mood appropriate Extremities: No pedal edema, no calf tenderness  Follow ups:    Follow-up Information     Ginger Organ., MD Follow up.   Specialty: Internal Medicine Contact information: [place] [address] [phone]         Nahser, Wonda Cheng, MD .   Specialty: Cardiology Contact information: [address] [place] [address] [phone]                 Discharge Instructions:   Discharge Instructions     Call MD for:  difficulty breathing, headache or visual disturbances   Complete by: As directed    Call MD for:  extreme fatigue   Complete by: As directed    Call MD for:  hives   Complete by: As directed    Call MD for:  persistant dizziness or  light-headedness   Complete by: As directed    Call MD for:  persistant nausea and vomiting   Complete by: As directed    Call MD for:  severe uncontrolled pain   Complete by: As directed    Call MD for:  temperature >100.4   Complete by: As directed    Diet general   Complete by: As directed    Discharge instructions   Complete by: As directed    General discharge instructions: Follow with Primary MD Ginger Organ., MD in 7 days  Please request your PCP  to go over your hospital tests, procedures, radiology results at the follow up. Please get your medicines reviewed and adjusted.  Your PCP may decide to repeat certain labs or tests as needed. Do not drive, operate heavy machinery, perform activities at heights, swimming or participation in water activities or provide baby sitting services if your were admitted for syncope or siezures until you have seen by Primary MD or a Neurologist  and advised to do so again. [place] Controlled Substance Reporting System database was reviewed. Do not drive, operate heavy machinery, perform activities at heights, swim, participate in water activities or provide baby-sitting services while on medications for pain, sleep and mood until your outpatient physician has reevaluated you and advised to do so again.  You are strongly recommended to comply with the dose, frequency and duration of prescribed medications. Activity: As tolerated with Full fall precautions use walker/cane & assistance as needed Avoid using any recreational substances like cigarette, tobacco, alcohol, or non-prescribed drug. If you experience worsening of your admission symptoms, develop shortness of breath, life threatening emergency, suicidal or homicidal thoughts you must seek medical attention immediately by calling 911 or calling your MD immediately  if symptoms less severe. You must read complete instructions/literature along with all the possible adverse reactions/side  effects for all the medicines you take and that have been prescribed to you. Take any new medicine only after you have completely understood and accepted all the possible adverse reactions/side effects.  Wear Seat belts while driving. You were cared for by a hospitalist during your hospital stay. If you have any questions about your discharge medications or the care you received while you were in the hospital after you are discharged, you can call the unit and ask to speak with the hospitalist or the covering physician. Once you are discharged, your primary care physician will handle any further medical issues. Please note that NO REFILLS for any discharge medications will be authorized once you are discharged, as it is imperative that you return to your primary care physician (or establish a relationship with a primary care physician if you do not have one).   Increase activity slowly   Complete by: As directed        Discharge Medications:   Allergies as of 12/09/2021       Reactions   Pravachol Other (See Comments)   Unknown   Triazolam Other (See Comments)   "Makes me crazy"        Medication List     STOP taking these medications    DULoxetine 60 MG capsule Commonly known as: CYMBALTA   oxyCODONE-acetaminophen 10-325 MG tablet Commonly known as: PERCOCET Replaced by: oxyCODONE-acetaminophen 5-325 MG tablet   pregabalin 75 MG capsule Commonly known as: LYRICA   sertraline 100 MG tablet Commonly known as: ZOLOFT       TAKE these medications    allopurinol 100 MG tablet Commonly known as: ZYLOPRIM Take 100 mg by mouth daily.   cephALEXin 500 MG capsule Commonly known as: KEFLEX Take 1 capsule (500 mg total) by mouth every 8 (eight) hours for 3 days.   diphenhydramine-acetaminophen 25-500 MG Tabs tablet Commonly known as: TYLENOL PM Take 1 tablet by mouth at bedtime as needed (sleep).   divalproex 250 MG DR tablet Commonly known as: DEPAKOTE Take 250 mg by  mouth 2 (two) times daily.   ezetimibe 10 MG tablet Commonly known as: ZETIA TAKE 1 TABLET BY MOUTH EVERY DAY   finasteride 5 MG tablet Commonly known as: PROSCAR Take 5 mg by mouth daily.   fludrocortisone 0.1 MG tablet Commonly known as: FLORINEF Take 0.5 tablets (0.05 mg total) by mouth daily. Start taking on: December 10, 2021 What changed: how much to take   levothyroxine 112 MCG tablet Commonly known as: SYNTHROID Take 112 mcg by mouth daily.   lidocaine 5 % Commonly known as: LIDODERM Place 1 patch onto the skin daily.  memantine 10 MG tablet Commonly known as: NAMENDA Take 1 tablet (10 mg total) by mouth 2 (two) times daily. What changed: when to take this   mirtazapine 15 MG tablet Commonly known as: REMERON Take 15 mg by mouth at bedtime.   nitroGLYCERIN 0.4 MG SL tablet Commonly known as: NITROSTAT Place 1 tablet (0.4 mg total) under the tongue every 5 (five) minutes as needed. What changed: reasons to take this   oxyCODONE-acetaminophen 5-325 MG tablet Commonly known as: PERCOCET/ROXICET Take 1 tablet by mouth every 6 (six) hours as needed for severe pain. Replaces: oxyCODONE-acetaminophen 10-325 MG tablet   pantoprazole 40 MG tablet Commonly known as: Protonix Take 1 tablet (40 mg total) by mouth daily before breakfast.   SYSTANE OP Place 1 drop into both eyes 2 (two) times daily as needed (dry eyes).   Vitamin D (Ergocalciferol) 1.25 MG (50000 UNIT) Caps capsule Commonly known as: DRISDOL Take 50,000 Units by mouth every Monday.         The results of significant diagnostics from this hospitalization (including imaging, microbiology, ancillary and laboratory) are listed below for reference.    Procedures and Diagnostic Studies:   CT Head Wo Contrast  Result Date: 12/05/2021 CLINICAL DATA:  Head trauma, minor (Age >= 65y).  Fall weakness EXAM: CT HEAD WITHOUT CONTRAST TECHNIQUE: Contiguous axial images were obtained from the base of the skull  through the vertex without intravenous contrast. RADIATION DOSE REDUCTION: This exam was performed according to the departmental dose-optimization program which includes automated exposure control, adjustment of the mA and/or kV according to patient size and/or use of iterative reconstruction technique. COMPARISON:  None Available. BRAIN: BRAIN Cerebral ventricle sizes are concordant with the degree of cerebral volume loss. Patchy and confluent areas of decreased attenuation are noted throughout the deep and periventricular white matter of the cerebral hemispheres bilaterally, compatible with chronic microvascular ischemic disease. No evidence of large-territorial acute infarction. No definite parenchymal hemorrhage. Interval development of a vague 0.4 cm left frontal hyperdensity along the gray-white matter junction (4:32, 2:25). Trace foci of subarachnoid hemorrhage along the right frontal lobe not fully excluded (2:27, 4:41). Bilateral chronic hygromas. No mass effect or midline shift. No hydrocephalus. Basilar cisterns are patent. Vascular: No hyperdense vessel. Skull: No acute fracture or focal lesion. Sinuses/Orbits: Paranasal sinuses and mastoid air cells are clear. Bilateral lens replacement. Otherwise the orbits are unremarkable. Other: None. IMPRESSION: 1. Trace foci of subarachnoid hemorrhage along the right frontal lobe not fully excluded. Consider repeat CT head in 6 hours to evaluate for stability. 2. Interval development of an indeterminate vague 0.4 cm left frontal hyperdensity along the gray-white matter junction. Electronically Signed   By: Iven Finn M.D.   On: 12/05/2021 16:51     Labs:   Basic Metabolic Panel: Recent Labs  Lab 12/05/21 1425 12/05/21 1630  NA 139 139  K 3.8 3.9  CL 102 102  CO2 29 28  GLUCOSE 100* 101*  BUN 23 22  CREATININE 1.33* 1.22  CALCIUM 9.3 9.1   GFR Estimated Creatinine Clearance: 37.7 mL/min (by C-G formula based on SCr of 1.22 mg/dL). Liver  Function Tests: Recent Labs  Lab 12/05/21 1425 12/05/21 1630  AST 17 22  ALT 15 21  ALKPHOS 57 65  BILITOT 0.7 0.8  PROT 7.0 8.0  ALBUMIN 4.7 4.7   No results for input(s): "LIPASE", "AMYLASE" in the last 168 hours. No results for input(s): "AMMONIA" in the last 168 hours. Coagulation profile No results for input(s): "  INR", "PROTIME" in the last 168 hours.  CBC: Recent Labs  Lab 12/05/21 1425 12/05/21 1630 12/07/21 0335  WBC 7.3 7.8 7.6  NEUTROABS 3.8 3.9  --   HGB 13.5 14.8 13.7  HCT 41.8 44.8 41.0  MCV 98.1 98.0 95.8  PLT 100* 106* 92*   Cardiac Enzymes: No results for input(s): "CKTOTAL", "CKMB", "CKMBINDEX", "TROPONINI" in the last 168 hours. BNP: Invalid input(s): "POCBNP" CBG: No results for input(s): "GLUCAP" in the last 168 hours. D-Dimer No results for input(s): "DDIMER" in the last 72 hours. Hgb A1c No results for input(s): "HGBA1C" in the last 72 hours. Lipid Profile No results for input(s): "CHOL", "HDL", "LDLCALC", "TRIG", "CHOLHDL", "LDLDIRECT" in the last 72 hours. Thyroid function studies No results for input(s): "TSH", "T4TOTAL", "T3FREE", "THYROIDAB" in the last 72 hours.  Invalid input(s): "FREET3" Anemia work up No results for input(s): "VITAMINB12", "FOLATE", "FERRITIN", "TIBC", "IRON", "RETICCTPCT" in the last 72 hours. Microbiology No results found for this or any previous visit (from the past 240 hour(s)).  Time coordinating discharge: 35 minutes  Signed: Emelio Schneller  [facility] 12/09/2021, 1:22 PM

## 2021-12-09 NOTE — Consult Note (Signed)
   [facility] Inpatient Consult   12/09/2021  [place] H Sevey 1926-07-15 [id number]  [facility] [[facility]] Patient: Medicare ACO REACH  Primary Care Provider:  Ginger Organ., MD, [facility], is an Independent embedded provider with a Chronic Care Management team and program, and is listed for the transition of care follow up and appointments.  Patient was screened for readmission less than 30 days and for disposition and found that is being recommended for an inpatient rehabilitation transition.  Plan: Needs for disposition to be determined post rehab. Will follow, as appropriate for  follow up needs.  Please contact for further questions,  [name], RN BSN [facility] Liaison  [phone] business mobile phone Toll free office [phone]  Fax number: [phone] [email or web address] '

## 2021-12-09 NOTE — Progress Notes (Addendum)
Inpatient Rehabilitation Admission Medication Review by a Pharmacist  A complete drug regimen review was completed for this patient to identify any potential clinically significant medication issues.  High Risk Drug Classes Is patient taking? Indication by Medication  Antipsychotic Yes Compazine - N/V  Anticoagulant No   Antibiotic No   Opioid Yes Percocet - pain  Antiplatelet No   Hypoglycemics/insulin No   Vasoactive Medication No   Chemotherapy No   Other Yes Allopurinol - gout Zetia - HLD Protonix - GERD Remeron - sleep Namenda - dementia Synthroid - hypthyroidism Proscar - BPH Depakote - neuropathy Florinef - hypotension Synthroid - hypothyroidism     Type of Medication Issue Identified Description of Issue Recommendation(s)  Drug Interaction(s) (clinically significant)     Duplicate Therapy     Allergy     No Medication Administration End Date     Incorrect Dose     Additional Drug Therapy Needed     Significant med changes from prior encounter (inform family/care partners about these prior to discharge). nitroglycerin, vitamin D   Other       Clinically significant medication issues were identified that warrant physician communication and completion of prescribed/recommended actions by midnight of the next day:  Yes  Name of provider notified for urgent issues identified: Algis Liming, PA  Provider Method of Notification:     Pharmacist comments:   Time spent performing this drug regimen review (minutes):  [place], PharmD, [place], AAHIVP, CPP Infectious Disease Pharmacist 12/09/2021 4:28 PM

## 2021-12-09 NOTE — Care Management Important Message (Signed)
Important Message  Patient Details  Name: Patrick [name] MRN: [id number] Date of Birth: 08/11/1926   Medicare Important Message Given:  Yes     Helios Kohlmann 12/09/2021, 3:22 PM

## 2021-12-09 NOTE — Progress Notes (Signed)
Physical Therapy Treatment  Patient Details Name: Patrick [name] MRN: [id number] DOB: [birth year] Today's Date: 12/09/2021   History of Present Illness Pt is a 88 y.o. male transported to [facility] ED 03/08/2022 from outside hospital due to several recent falls and reports of hitting head during fall. CT revealed trace foci of subarachnoid hemorrhage along R frontal lobe not fully extended and interval development of an intermediate vague .4 cm L frontal hyperdensoty along gray-white matter junction.  Pt with recent admission 6/26 -6/28 with intracranial hemorrhage s/p a fall.  PMH significant for CAD, CKD stage IIIa, chronic thrombocytopenia, hypertension, hyperlipidemia, hypothyroidism, BPH, GERD, gout, chronic pain, dementia, depression.    PT Comments    Pt progressing towards physical therapy goals. Seen in conjunction with OT to maximize mobility progression and safety while assessing BP during functional activity. Thigh highs and abdominal binder donned throughout mobility, and BP continued to significantly drop, especially in standing. Pt and wife anticipating d/c to [facility] today. Will continue to follow acutely until pt is d/c'd.   Orthostatic BPs  Supine 152/74  Sitting 125/74  Sitting after 8 min 135/73  Standing 86/60  Second stand after recovery in sitting 83/60  After transfer to the chair 81/44  Sitting in recliner at end of session 127/74     Recommendations for follow up therapy are one component of a multi-disciplinary discharge planning process, led by the attending physician.  Recommendations may be updated based on patient status, additional functional criteria and insurance authorization.  Follow Up Recommendations  Acute inpatient rehab (3hours/day)     Assistance Recommended at Discharge Frequent or constant Supervision/Assistance  Patient can return home with the following Two people to help with walking and/or transfers;Two people to help with  bathing/dressing/bathroom;Assistance with cooking/housework;Assist for transportation;Help with stairs or ramp for entrance   Equipment Recommendations  Other (comment) (TBD by next venue of care)    Recommendations for Other Services Rehab consult     Precautions / Restrictions Precautions Precautions: Fall Precaution Comments: watch BP, orthostatic Restrictions Weight Bearing Restrictions: No     Mobility  Bed Mobility Overal bed mobility: Needs Assistance Bed Mobility: Supine to Sit       Sit to supine: Min assist   General bed mobility comments: Assist for transition to EOB, trunk elevation and to gain/maintain sitting balance.    Transfers Overall transfer level: Needs assistance Equipment used: None Transfers: Sit to/from Stand, Bed to chair/wheelchair/BSC Sit to Stand: +2 safety/equipment, Mod assist, +2 physical assistance   Step pivot transfers: Max assist, +2 physical assistance, +2 safety/equipment       General transfer comment: Increased ataxia noted in standing as well as increased right knee flexion and external rotation requiring mod to max assist from therapist to try to achieve and maintain knee extension in weightbearing.    Ambulation/Gait               General Gait Details: Unable to progress to gait training at this time.   Stairs             Wheelchair Mobility    Modified Rankin (Stroke Patients Only) Modified Rankin (Stroke Patients Only) Pre-Morbid Rankin Score: Slight disability Modified Rankin: Moderately severe disability     Balance Overall balance assessment: Needs assistance Sitting-balance support: No upper extremity supported, Feet supported Sitting balance-Leahy Scale: Poor Sitting balance - Comments: Pt with right lean and LOB frequently with static sitting.  Max assist to maintain balance with dynamic tasks such as dressing.  Standing balance support: Bilateral upper extremity supported, During functional  activity Standing balance-Leahy Scale: Zero Standing balance comment: Pt needs +2 assist for safety with standing and transfers.                            Cognition Arousal/Alertness: Awake/alert Behavior During Therapy: Flat affect Overall Cognitive Status: Impaired/Different from baseline Area of Impairment: Attention, Following commands, Problem solving                   Current Attention Level: Sustained   Following Commands: Follows one step commands with increased time     Problem Solving: Slow processing, Requires tactile cues, Difficulty sequencing General Comments: Pt with slower cognitive processing to instructions given at times and slower verbal response.        Exercises      General Comments        Pertinent Vitals/Pain Pain Assessment Pain Assessment: Faces Faces Pain Scale: No hurt Pain Intervention(s): Monitored during session    Home Living                          Prior Function            PT Goals (current goals can now be found in the care plan section) Acute Rehab PT Goals Patient Stated Goal: Pt and wife eager to get to [facility] PT Goal Formulation: With patient/family Time For Goal Achievement: 12/21/21 Potential to Achieve Goals: Good Progress towards PT goals: Progressing toward goals    Frequency    Min 4X/week      PT Plan Current plan remains appropriate    Co-evaluation PT/OT/SLP Co-Evaluation/Treatment: Yes Reason for Co-Treatment: Complexity of the patient's impairments (multi-system involvement);Necessary to address cognition/behavior during functional activity;For patient/therapist safety;To address functional/ADL transfers PT goals addressed during session: Mobility/safety with mobility;Balance;Proper use of DME OT goals addressed during session: ADL's and self-care      AM-PAC PT "6 Clicks" Mobility   Outcome Measure  Help needed turning from your back to your side while in a flat bed  without using bedrails?: A Little Help needed moving from lying on your back to sitting on the side of a flat bed without using bedrails?: A Little Help needed moving to and from a bed to a chair (including a wheelchair)?: A Lot Help needed standing up from a chair using your arms (e.g., wheelchair or bedside chair)?: A Lot Help needed to walk in hospital room?: Total Help needed climbing 3-5 steps with a railing? : Total 6 Click Score: 12    End of Session Equipment Utilized During Treatment: Gait belt Activity Tolerance: Other (comment) (limited by orthostatic hypotension) Patient left: in chair;with call bell/phone within reach;with chair alarm set;with family/visitor present Nurse Communication: Mobility status (BP status) PT Visit Diagnosis: Unsteadiness on feet (R26.81);Other symptoms and signs involving the nervous system (R29.898)     Time: 1610-9604 PT Time Calculation (min) (ACUTE ONLY): 43 min  Charges:  $Gait Training: 8-22 mins $Therapeutic Activity: 8-22 mins                     Rolinda Roan, PT, DPT Acute Rehabilitation Services Secure Chat Preferred Office: [phone]    Thelma Comp 12/09/2021, 1:02 PM

## 2021-12-09 NOTE — Progress Notes (Signed)
Inpatient Rehab Admissions Coordinator:  There is a bed available for pt in CIR today. Dr. Pietro Cassis is aware and in agreement. Pt, wife [name], [place] and TOC aware.   Gayland Curry, [place], [place] Admissions Coordinator [phone]

## 2021-12-09 NOTE — Progress Notes (Signed)
Signed     PMR Admission Coordinator Pre-Admission Assessment   Patient: Patrick [name] is an 88 y.o., male MRN: [id number] DOB: 1926/09/22 Height: 6' (182.9 cm) Weight: 68.9 kg   Insurance Information HMO:     PPO:      PCP:      IPA:      80/20: yes     OTHER:  PRIMARY: Medicare A & B      Policy#: [id number]      Subscriber: patient CM Name:       Phone#:      Fax#:  Pre-Cert#:       Employer:  Benefits:  Phone #: verified eligibility via [place] on 12/08/21     Name:  Eff. Date: Part A effective 08/14/95, Part B effective 04/14/02     Deduct: $1,600      Out of Pocket Max: NA      Life Max: NA CIR: 100% coverage      SNF: 100% coverage days 1-20, 80% coverage days 21-100 Outpatient: 80% coverage     Co-Pay: 20% Home Health: 100% coverage      Co-Pay:  DME: 80% coverage     Co-Pay: 20% Providers: pt's choice SECONDARY: BCBS Supplement    Policy#: [id number]     Phone#: [phone]   Financial Counselor:       Phone#:    The "Data Collection Information Summary" for patients in Inpatient Rehabilitation Facilities with attached "Privacy Act [place] Records" was provided and verbally reviewed with: patient, family   Emergency Contact Information Contact Information       Name Relation Home Work Mobile    [place] Spouse [phone]   [phone]           Current Medical History  Patient Admitting Diagnosis: ICH History of Present Illness: Pt is a 88 year old male with medical hx significant for: CAD, CKD stage IIIa, chronic thrombocytopenia, HTN, hyperlipidemia, hypothyroidism, BPH, GERD, gout, chronic pain, depression, bilateral foot drop, tremor, dementia. Pt [facility] after multiple falls; pt hit head. Progressive weakness and slurred speech also endorsed. Pt recently hospitalized 6/26-6/28 for small ICH in the setting of thrombocytopenia. Imaging also revealed severe stenosis in posterior  circulation; not a candidate for antiplatelet agents d/t ICH. CT on 7/24 showed trace foci of subarachnoid hemorrhage along right frontal lobe. Interval development of an indeterminate vague 0.4cm left frontal hyperdensity along the gray-white matter junction. No neurosurgical intervention recommended. Pt  was dehydrated and given IV fluids and was hypertensive and given IV labetalol.  Pt transferred to [facility] on 12/06/21. Pt has been orthostatic; started on abdominal binder and TED hose.Therapy evaluations completed and CIR recommended d/t pt's deficits in functional mobility and inability to complete ADLs independently. Complete NIHSS TOTAL: 2   Patient's medical record from [facility] has been reviewed by the rehabilitation admission coordinator and physician.   Past Medical History      Past Medical History:  Diagnosis Date   Bilateral foot-drop 12/17/2019   BPH (benign prostatic hypertrophy)     Diverticulosis     ED (erectile dysfunction)     Food impaction of esophagus w/mild gastritis 06/28/2021   Gait abnormality 12/17/2019   GERD (gastroesophageal reflux disease)     Gout     Hepatitis      hx hepatitis after mono as teenager   History of kidney stones     History of skin cancer  Hyperlipidemia     Hypertension     Hypothyroidism     IHD (ischemic heart disease)      Remote CABG in 1997   Memory disorder 12/17/2019   MI, acute, non ST segment elevation ([place]) 2010    s/p cath with occluded SVG to PD that fills by collaterals and the remainder of his revasculsarization is satisfactory. 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 No   Family History    family history includes Heart failure (age of onset: 24) in his mother.   Current Medications   Current Facility-Administered Medications:    acetaminophen (TYLENOL) tablet 650 mg, 650 mg, Oral, Q6H PRN **OR** acetaminophen (TYLENOL) suppository 650 mg, 650 mg, Rectal, Q6H PRN, Shela Leff, MD   allopurinol (ZYLOPRIM) tablet 100 mg, 100 mg, Oral, Daily, Shela Leff, MD, 100 mg at 12/09/21 1037   cephALEXin (KEFLEX) capsule 500 mg, 500 mg, Oral, Q8H, Dahal, Binaya, MD, 500 mg at 12/09/21 1157   divalproex (DEPAKOTE) DR tablet 250 mg, 250 mg, Oral, BID, Dahal, Binaya, MD, 250 mg at 12/09/21 1037   ezetimibe (ZETIA) tablet 10 mg, 10 mg, Oral, Daily, Shela Leff, MD, 10 mg at 12/09/21 1037   finasteride (PROSCAR) tablet 5 mg, 5 mg, Oral, Daily, Shela Leff, MD, 5 mg at 12/09/21 1037   fludrocortisone (FLORINEF) tablet 0.05 mg, 0.05 mg, Oral, Daily, Dahal, Binaya, MD, 0.05 mg at 12/09/21 1157   hydrALAZINE (APRESOLINE) injection 10 mg, 10 mg, Intravenous, Q4H PRN, Shela Leff, MD, 10 mg at 12/09/21 2355   labetalol (NORMODYNE) injection 10 mg, 10 mg, Intravenous, Q2H PRN, Shela Leff, MD, 10 mg at 12/07/21 0114   levothyroxine (SYNTHROID) tablet 112 mcg, 112 mcg, Oral, Q0600, Shela Leff, MD, 112 mcg at 12/09/21 0652   lidocaine (LIDODERM) 5 % 1 patch, 1 patch, Transdermal, Q24H, Shela Leff, MD, 1 patch at 12/09/21 1036   memantine (NAMENDA) tablet 10 mg, 10 mg, Oral, QHS, Shela Leff, MD, 10 mg at 12/08/21 2032   mirtazapine (REMERON) tablet 15 mg, 15 mg, Oral, QHS, Shela Leff, MD, 15 mg at 12/08/21 2032   oxyCODONE-acetaminophen (PERCOCET/ROXICET) 5-325 MG per tablet 1 tablet, 1 tablet, Oral, Q6H PRN, Dahal, Marlowe Aschoff, MD, 1 tablet at 12/09/21 0306   pantoprazole (PROTONIX) EC tablet 40 mg, 40 mg, Oral, QAC breakfast, Shela Leff, MD, 40 mg at 12/09/21 7322   Patients Current Diet:  Diet Order                  Diet  Heart Room service appropriate? Yes; Fluid consistency: Thin  Diet effective now                         Precautions / Restrictions Precautions Precautions: Fall Precaution Comments: watch BP, orthostatic Restrictions Weight Bearing Restrictions: No    Has the patient had 2 or more falls or a fall with injury in the past year? Yes   Prior Activity Level Community (5-7x/wk): gets out of house often   Prior Functional Level Self Care: Did the patient need help bathing, dressing, using the toilet or eating?  Independent   Indoor Mobility: Did the patient need assistance with walking from room to room (with or without device)? Independent   Stairs: Did the patient need assistance with internal or external stairs (with or without device)? Independent   Functional Cognition: Did the patient need help planning regular tasks such as shopping or remembering to take medications? Needed some help   Patient Information Are you of Hispanic, Latino/a,or Spanish origin?: A. No, not of Hispanic, Latino/a, or Spanish origin What is your race?: A. White Do you need or want an interpreter to communicate with a doctor or health care staff?: 0. No   Patient's Response To:  Health Literacy and Transportation Is the patient able to respond to health literacy and transportation needs?: Yes Health Literacy - How often do you need to have someone help you when you read instructions, pamphlets, or other written material from your doctor or pharmacy?: Often (wife reads medical information) In the past 12 months, has lack of transportation kept you from medical appointments or from getting medications?: No In the past 12 months, has lack of transportation kept you from meetings, work, or from getting things needed for daily living?: No   Development worker, international aid / [place]: Grab bars - tub/shower, Civil engineer, contracting, Conservation officer, nature (2 wheels), [employer] - single point   Prior Device Use: Indicate  devices/aids used by the patient prior to current illness, exacerbation or injury? None of the above (has used a walker recently)   Current Functional Level Cognition   Overall Cognitive Status: Impaired/Different from baseline Current Attention Level: Sustained Orientation Level: Oriented to person, Oriented to place, Oriented to time, Disoriented to situation Following Commands: Follows one step commands with increased time General Comments: Pt with slower cognitive processing to instructions given at times and slower verbal response.    Extremity Assessment (includes Sensation/Coordination)   Upper Extremity Assessment: Defer to OT evaluation RUE Deficits / Details: Pt with decreased fine motor coordination R>L. Poor motor planning.organization during finger opposition. Difficulty with proprioception during finger to nose testing and finger to therapist's finger. Pt with difficulty performing finger to nose and then therapists finger. Pt observed with undershooting consistently. RUE Coordination: decreased fine motor LUE Deficits / Details: Pt with decreased fine motor coordination R>L. Poor motor planning.organization during consecutive finger opposition. Difficulty with proprioception during finger to nose testing and finger to therapist's finger. Pt with difficulty performing finger to nose and then therapists finger. Pt observed with undershooting consistently.  Lower Extremity Assessment: Generalized weakness, RLE deficits/detail RLE Deficits / Details: Good strength with MMT bilaterally however LE's appear weak with functional mobility. Quick muscular fatigue. RLE Sensation: history of peripheral neuropathy (Bilaterally)     ADLs   Overall ADL's : Needs assistance/impaired Grooming: Set up, Min guard, Sitting Grooming Details (indicate cue type and reason): Min guard A for safety due to inconsistencies in BP Upper Body Bathing: Min guard, Sitting Lower Body Bathing: Sit to/from  stand, Moderate assistance Upper Body Dressing : Minimal assistance, Sitting Lower Body Dressing: +2 for safety/equipment, +2 for physical assistance, Sit to/from stand, Maximal assistance Lower Body Dressing Details (indicate cue type and reason): require assist to don socks, min assist +2 safety standing Toilet Transfer: +2 for physical assistance, Stand-pivot, Maximal assistance Toilet Transfer Details (indicate cue type and reason): simulated to recliner, pt declined need to toilet and has condom catheter Toileting- Clothing Manipulation and Hygiene: Moderate assistance, +2 for physical assistance, Sit to/from stand Toileting - Clothing Manipulation Details (indicate cue  type and reason): Pt on wet sheet on arrival. Bed mobility after BP drop to clean up. Tub/ Shower Transfer:  (defer this session. unable to progress mobility for safety) Functional mobility during ADLs: Maximal assistance, +2 for physical assistance General ADL Comments: Pt still with orthostatic hypotension in standing with TEDS and binder in place.  In supine BP at 152/74 and in sitting at 125/74-135/73.  With standing it would decreased down into the 40H systolically and in the 47-42 range diastolically in less than 2 mins of standing requiring the need to sit down.  Decreased RUE coordination with ataxia noted when attempting to hold and donn his gripper socks.     Mobility   Overal bed mobility: Needs Assistance Bed Mobility: Supine to Sit Rolling: Supervision, Min assist (increased time; min A following decreased in BP and for cleaning up in bed.) Sidelying to sit: Min guard (Increaed time. Min guard A for safety) Supine to sit: Min assist Sit to supine: Total assist (drop in BP total assist back to supine for pt safety) General bed mobility comments: Mod demonstrational cueing needed to sequence bathing with min assist for sidelying to sit after rolling over.     Transfers   Overall transfer level: Needs  assistance Equipment used: None Transfers: Sit to/from Stand, Bed to chair/wheelchair/BSC Sit to Stand: +2 safety/equipment, Mod assist Bed to/from chair/wheelchair/BSC transfer type:: Step pivot Step pivot transfers: Max assist, +2 physical assistance, +2 safety/equipment General transfer comment: Increased ataxia noted in standing as well as increased right knee flexion and external rotation requiring mod to max assist from therapist to try to achieve and maintain knee extension in weightbearing.     Ambulation / Gait / Stairs / Wheelchair Mobility   Ambulation/Gait General Gait Details: Unable to progress to gait training at this time.     Posture / Balance Dynamic Sitting Balance Sitting balance - Comments: Pt with right lean and LOB frequently with static sitting.  Max assist to maintain balance with dynamic tasks such as dressing. Balance Overall balance assessment: Needs assistance Sitting-balance support: No upper extremity supported, Feet supported Sitting balance-Leahy Scale: Poor Sitting balance - Comments: Pt with right lean and LOB frequently with static sitting.  Max assist to maintain balance with dynamic tasks such as dressing. Standing balance support: Bilateral upper extremity supported, During functional activity Standing balance-Leahy Scale: Zero Standing balance comment: Pt needs +2 assist for safety with standing and transfers.     Special needs/care consideration External urinary catheter    Previous Home Environment (from acute therapy documentation) Living Arrangements: Spouse/significant other  Lives With: Spouse Available Help at Discharge: Family, Available 24 hours/day Type of Home: House Home Layout: Able to live on main level with bedroom/bathroom Alternate Level Stairs-Rails: Right Alternate Level Stairs-Number of Steps: flight Home Access: Stairs to enter Entrance Stairs-Rails: Right Entrance Stairs-Number of Steps: 2 Bathroom Shower/Tub: Clinical cytogeneticist: Handicapped height Bathroom Accessibility: Yes How Accessible: Accessible via walker [place]: Yes Type of Home Care Services: Home PT Additional Comments: Pt wife reporting pt has a walker, but poor compliance.   Discharge Living Setting Plans for Discharge Living Setting: Patient's home Type of Home at Discharge: House Discharge Home Layout: Able to live on main level with bedroom/bathroom Discharge Home Access: Stairs to enter Entrance Stairs-Rails: Right Entrance Stairs-Number of Steps: 2 Discharge Bathroom Shower/Tub: Walk-in shower Discharge Bathroom Toilet: Handicapped height Discharge Bathroom Accessibility: Yes How Accessible: Accessible via walker Does the patient have any problems obtaining  your medications?: No   Social/Family/Support Systems Anticipated Caregiver: Artist Bloom, wife Anticipated Caregiver's Contact Information: [phone] Caregiver Availability: 24/7 Discharge Plan Discussed with Primary Caregiver: Yes Is Caregiver In Agreement with Plan?: Yes Does Caregiver/Family have Issues with Lodging/Transportation while Pt is in Rehab?: No   Goals Patient/Family Goal for Rehab: Supervision-Min A: PT/OT Expected length of stay: 14-16 days Pt/Family Agrees to Admission and willing to participate: Yes Program Orientation Provided & Reviewed with Pt/Caregiver Including Roles  & Responsibilities: Yes   Decrease burden of Care through IP rehab admission: NA   Possible need for SNF placement upon discharge: Not anticipated   Patient Condition: I have reviewed medical records from [facility], spoken with CM, and patient and spouse. I met with patient at the bedside for inpatient rehabilitation assessment.  Patient will benefit from ongoing PT and OT, can actively participate in 3 hours of therapy a day 5 days of the week, and can make measurable gains during the admission.  Patient will also benefit from the coordinated  team approach during an Inpatient Acute Rehabilitation admission.  The patient will receive intensive therapy as well as Rehabilitation physician, nursing, social worker, and care management interventions.  Due to bladder management, safety, disease management, medication administration, pain management, and patient education the patient requires 24 hour a day rehabilitation nursing.  The patient is currently Min-Max A +2 with mobility and Max A with basic ADLs.  Discharge setting and therapy post discharge at home with home health is anticipated.  Patient has agreed to participate in the Acute Inpatient Rehabilitation Program and will admit today.   Preadmission Screen Completed By:  Bethel Born, 12/09/2021 12:50 PM ______________________________________________________________________   Discussed status with Dr. Ranell Patrick on 12/09/21 at 12:50 PM and received approval for admission today.   Admission Coordinator:  Bethel Born, CCC-SLP, time 12:50 PM/Date 12/09/21     Assessment/Plan: Diagnosis: ICH Does the need for close, 24 hr/day Medical supervision in concert with the patient's rehab needs make it unreasonable for this patient to be served in a less intensive setting? Yes Co-Morbidities requiring supervision/potential complications: gout, orthostatic hypotension, HTN, dementia, CAD, bilateral foot drop Due to bladder management, bowel management, safety, skin/wound care, disease management, medication administration, pain management, and patient education, does the patient require 24 hr/day rehab nursing? Yes Does the patient require coordinated care of a physician, rehab nurse, PT, OT, and SLP to address physical and functional deficits in the context of the above medical diagnosis(es)? Yes Addressing deficits in the following areas: balance, endurance, locomotion, strength, transferring, bowel/bladder control, bathing, dressing, feeding, grooming, toileting, and  cognition Can the patient actively participate in an intensive therapy program of at least 3 hrs of therapy 5 days a week? Yes The potential for patient to make measurable gains while on inpatient rehab is excellent Anticipated functional outcomes upon discharge from inpatient rehab: supervision PT, supervision OT, supervision SLP Estimated rehab length of stay to reach the above functional goals is: 21 days Anticipated discharge destination: Home 10. Overall Rehab/Functional Prognosis: excellent     MD Signature: Leeroy Cha, MD

## 2021-12-10 DIAGNOSIS — S066X9S Traumatic subarachnoid hemorrhage with loss of consciousness of unspecified duration, sequela: Secondary | ICD-10-CM | POA: Diagnosis not present

## 2021-12-10 LAB — COMPREHENSIVE METABOLIC PANEL
ALT: 16 U/L (ref 0–44)
AST: 17 U/L (ref 15–41)
Albumin: 3.4 g/dL — ABNORMAL LOW (ref 3.5–5.0)
Alkaline Phosphatase: 55 U/L (ref 38–126)
Anion gap: 8 (ref 5–15)
BUN: 18 mg/dL (ref 8–23)
CO2: 27 mmol/L (ref 22–32)
Calcium: 8.6 mg/dL — ABNORMAL LOW (ref 8.9–10.3)
Chloride: 104 mmol/L (ref 98–111)
Creatinine, Ser: 1.17 mg/dL (ref 0.61–1.24)
GFR, Estimated: 58 mL/min — ABNORMAL LOW (ref >60)
Glucose, Bld: 112 mg/dL — ABNORMAL HIGH (ref 70–99)
Potassium: 3.8 mmol/L (ref 3.5–5.1)
Sodium: 139 mmol/L (ref 135–145)
Total Bilirubin: 0.6 mg/dL (ref 0.3–1.2)
Total Protein: 5.8 g/dL — ABNORMAL LOW (ref 6.5–8.1)

## 2021-12-10 LAB — CBC WITH DIFFERENTIAL/PLATELET
Abs Immature Granulocytes: 0.29 10*3/uL — ABNORMAL HIGH (ref 0.00–0.07)
Basophils Absolute: 0 10*3/uL (ref 0.0–0.1)
Basophils Relative: 0 %
Eosinophils Absolute: 0 10*3/uL (ref 0.0–0.5)
Eosinophils Relative: 0 %
HCT: 39.8 % (ref 39.0–52.0)
Hemoglobin: 13.3 g/dL (ref 13.0–17.0)
Immature Granulocytes: 3 %
Lymphocytes Relative: 16 %
Lymphs Abs: 1.4 10*3/uL (ref 0.7–4.0)
MCH: 32.3 pg (ref 26.0–34.0)
MCHC: 33.4 g/dL (ref 30.0–36.0)
MCV: 96.6 fL (ref 80.0–100.0)
Monocytes Absolute: 2.4 10*3/uL — ABNORMAL HIGH (ref 0.1–1.0)
Monocytes Relative: 27 %
Neutro Abs: 4.6 10*3/uL (ref 1.7–7.7)
Neutrophils Relative %: 54 %
Platelets: 89 10*3/uL — ABNORMAL LOW (ref 150–400)
RBC: 4.12 MIL/uL — ABNORMAL LOW (ref 4.22–5.81)
RDW: 13.8 % (ref 11.5–15.5)
WBC: 8.7 10*3/uL (ref 4.0–10.5)
nRBC: 0 % (ref 0.0–0.2)

## 2021-12-10 LAB — URINE CULTURE: Culture: 60000 — AB

## 2021-12-10 LAB — VITAMIN D 25 HYDROXY (VIT D DEFICIENCY, FRACTURES): Vit D, 25-Hydroxy: 67.71 ng/mL (ref 30–100)

## 2021-12-10 MED ORDER — ADULT MULTIVITAMIN W/MINERALS CH
1.0000 | ORAL_TABLET | Freq: Every day | ORAL | Status: DC
Start: 2021-12-10 — End: 2021-12-22
  Administered 2021-12-10 – 2021-12-22 (×13): 1 via ORAL
  Filled 2021-12-10 (×13): qty 1

## 2021-12-10 MED ORDER — ENSURE ENLIVE PO LIQD: 237.0000 mL | Freq: Two times a day (BID) | ORAL | Status: DC

## 2021-12-10 MED ORDER — CEPHALEXIN 250 MG PO CAPS
500.0000 mg | ORAL_CAPSULE | Freq: Two times a day (BID) | ORAL | Status: AC
Start: 2021-12-10 — End: 2021-12-14
  Administered 2021-12-10 – 2021-12-14 (×9): 500 mg via ORAL
  Filled 2021-12-10 (×9): qty 2

## 2021-12-10 MED ORDER — TAMSULOSIN HCL 0.4 MG PO CAPS
0.4000 mg | ORAL_CAPSULE | Freq: Every day | ORAL | Status: DC
  Administered 2021-12-10: 0.4 mg via ORAL
  Filled 2021-12-10: qty 1

## 2021-12-10 NOTE — Plan of Care (Signed)
  Problem: RH Expression Communication Goal: LTG Patient will increase speech intelligibility (SLP) Description: LTG: Patient will increase speech intelligibility at word/phrase/conversation level with cues, % of the time (SLP) Flowsheets (Taken 12/10/2021 1231) LTG: Patient will increase speech intelligibility (SLP): Supervision Level: Conversation level Percent of time patient will use intelligible speech: 100%   Problem: RH Attention Goal: LTG Patient will demonstrate this level of attention during functional activites (SLP) Description: LTG:  Patient will will demonstrate this level of attention during functional activites (SLP) Flowsheets (Taken 12/10/2021 1231) Patient will demonstrate during cognitive/linguistic activities the attention type of: Sustained Patient will demonstrate this level of attention during cognitive/linguistic activities in: Home LTG: Patient will demonstrate this level of attention during cognitive/linguistic activities with assistance of (SLP): Minimal Assistance - Patient > 75% Number of minutes patient will demonstrate attention during cognitive/linguistic activities: 5-10

## 2021-12-10 NOTE — Evaluation (Signed)
Speech Language Pathology Assessment and Plan  Patient Details  Name: Patrick [name] MRN: [id number] Date of Birth: 11-04-1926  SLP Diagnosis: Cognitive Impairments;Dysarthria  Rehab Potential: Good ELOS: 3 weeks - shorter duration for ST   Today's Date: 12/10/2021 SLP Individual Time: 1102-1200 SLP Individual Time Calculation (min): 1 min  Hospital Problem: Principal Problem:   Subarachnoid hematoma with loss of consciousness, sequela ([place])  Past Medical History:  Past Medical History:  Diagnosis Date   Bilateral foot-drop 12/17/2019   BPH (benign prostatic hypertrophy)    Diverticulosis    ED (erectile dysfunction)    Food impaction of esophagus w/mild gastritis 06/28/2021   Gait abnormality 12/17/2019   GERD (gastroesophageal reflux disease)    Gout    H/O multiple concussions--as teenager    Hepatitis    hx hepatitis after mono as teenager   History of kidney stones    History of skin cancer    Hyperlipidemia    Hypertension    Hypothyroidism    IHD (ischemic heart disease)    Remote CABG in 1997   Memory disorder 12/17/2019   MI, acute, non ST segment elevation ([place]) 2010   s/p cath with occluded SVG to PD that fills by collaterals and the remainder of his revasculsarization is satisfactory. "the first time they did the stent , the second time they did the graft 8 vessels"   Nocturia    Schatzki's ring--mild    Thrombocytopenia ([place]) 11/19/2018   Tremor, essential 12/17/2019   Urinary frequency    Past Surgical History:  Past Surgical History:  Procedure Laterality Date   APPENDECTOMY     BALLOON DILATION N/A 03/25/2019   Procedure: BALLOON DILATION;  Surgeon: Patrick Brace, MD;  Location: [facility];  Service: Gastroenterology;  Laterality: N/A;   CARDIAC CATHETERIZATION  09/08/2008   NORMAL. EF 60%; Occluded SVG to PD that fills by collaterals and the remainder of his revascularization is satisfactory. he is managed medically.    CORONARY ARTERY  BYPASS GRAFT  1997   CABG x 8   CORONARY STENT INTERVENTION N/A 11/18/2018   Procedure: CORONARY STENT INTERVENTION;  Surgeon: Patrick Mocha, MD;  Location: [facility];  Service: Cardiovascular;  Laterality: N/A;   CYSTOSCOPY WITH INSERTION OF UROLIFT N/A 03/19/2015   Procedure: CYSTOSCOPY WITH INSERTION OF FOUR UROLIFTS;  Surgeon: Carolan Clines, MD;  Location: [facility];  Service: Urology;  Laterality: N/A;   ESOPHAGOGASTRODUODENOSCOPY (EGD) WITH PROPOFOL N/A 02/12/2017   Procedure: ESOPHAGOGASTRODUODENOSCOPY (EGD) WITH PROPOFOL;  Surgeon: Patrick Brace, MD;  Location: [facility];  Service: Gastroenterology;  Laterality: N/A;   ESOPHAGOGASTRODUODENOSCOPY (EGD) WITH PROPOFOL N/A 07/13/2018   Procedure: ESOPHAGOGASTRODUODENOSCOPY (EGD) WITH PROPOFOL;  Surgeon: Patrick Essex, MD;  Location: [facility];  Service: Endoscopy;  Laterality: N/A;   ESOPHAGOGASTRODUODENOSCOPY (EGD) WITH PROPOFOL N/A 03/25/2019   Procedure: ESOPHAGOGASTRODUODENOSCOPY (EGD) WITH PROPOFOL;  Surgeon: Patrick Brace, MD;  Location: [facility];  Service: Gastroenterology;  Laterality: N/A;   ESOPHAGOGASTRODUODENOSCOPY (EGD) WITH PROPOFOL N/A 06/28/2021   Procedure: ESOPHAGOGASTRODUODENOSCOPY (EGD) WITH PROPOFOL;  Surgeon: Patrick Corner, MD;  Location: [facility];  Service: Endoscopy;  Laterality: N/A;   FOREIGN BODY REMOVAL N/A 07/13/2018   Procedure: FOREIGN BODY REMOVAL;  Surgeon: Patrick Essex, MD;  Location: [facility];  Service: Endoscopy;  Laterality: N/A;   IMPACTION REMOVAL  06/28/2021   Procedure: IMPACTION REMOVAL;  Surgeon: Patrick Corner, MD;  Location: [facility];  Service: Endoscopy;;   LEFT HEART CATH AND CORS/GRAFTS ANGIOGRAPHY N/A 11/18/2018   Procedure: LEFT HEART  CATH AND CORS/GRAFTS ANGIOGRAPHY;  Surgeon: Patrick Mocha, MD;  Location: [facility];  Service: Cardiovascular;  Laterality: N/A;    Assessment / Plan / Recommendation Clinical Impression  Patient is a 88 year old  male with history of HTN, peripheral neuropathy with bilateral foot drop, CAD s/p CABG/PTCA, dementia, Vitamin B12 deficiency, chronic LBP w/neurogenic claudication (Patrick [name]), multiple falls (unable to tolerate outpatient PT due to orthostatic changes), seizure type activity after fall 5/23 and recent admission 06/26-06/28 for lethargy with metabolic encephalopathy and slurred speech secondary to [place] in setting of thrombocytopenia. He was also found to have severe stenosis of distal BA and bilateral PCA. He was discharged to home with HHPT/HHOT and referred to Patrick [name]  07/24 who felt that patient with mild ITP. While there, wife reported lethargy with excessive sleep, poor intake as well as recurrent falls therefore was sent to ED for evaluation. CT head showed trace foci of SAH in right frontal, interval development of left frontal hypodensity along gray white matter junction as well as chronic bilateral hygromas. Per chart, NS was consulted and did not feel intervention or repeat CT head needed.  Per patient's wife who was a very helpful historian, pt is reported to be at baseline for cognition and swallow function. Pt has dementia at baseline with significant cognitive impairment, wife is primary caregiver. Pt's wife reports very occasional residual slurred speech since hospitalization but states "the other doctor said it is just because of fatigue". Pt alert and cooperative throughout assessment, requiring frequent verbal cues for sustained attention and repetition of prompts and questions. Pt speech intelligibility judged to be >90% at simple conversation level, wife reports slurring comes and goes throughout day. Pt able to correctly state name and DOB, requires extra time and mod cues to recall other biographical information. Wife reports he is not oriented at baseline, usually sleeps 9pm-12/1pm and is mostly only responsible for laundry at home. Pt unable to complete simple math problems, digit  manipulation or recall during assessment. Discussed with pt and wife ST services to follow 1-2x for education regarding speech intelligibility strategies and attention during functional tasks. Pt is tolerating a regular/thin diet with no reported difficulty or change. Pt will benefit from skilled ST during CIR to increase safety and independence with baseline lifestyle and increase speech intelligibility. Pt will need continued 24/7 supervision at home.    Skilled Therapeutic Interventions          Pt participating in portions of SLUMS evaluation as well as informal assessment of speech, language and cognition. Please see above.   SLP Assessment  Patient will need skilled Speech Lanaguage Pathology Services during CIR admission    Recommendations  SLP Diet Recommendations: Age appropriate regular solids;Thin Liquid Administration via: Cup;Straw Oral Care Recommendations: Oral care BID Patient destination: Home Follow up Recommendations: 24 hour supervision/assistance Equipment Recommended: None recommended by SLP    SLP Frequency 1 to 3 out of 7 days   SLP Duration  SLP Intensity  SLP Treatment/Interventions 3 weeks - shorter duration for ST  Minumum of 1-2 x/day, 30 to 90 minutes  Cognitive remediation/compensation;Patient/family education;Therapeutic Activities;Functional tasks    Pain Pain Assessment Pain Scale: 0-10 Pain Score: 0-No pain  Prior Functioning Cognitive/Linguistic Baseline: Baseline deficits Baseline deficit details: dementia Type of Home: House  Lives With: Spouse Available Help at Discharge: Family;Available 24 hours/day Vocation: Retired  Programmer, systems Overall Cognitive Status: History of cognitive impairments - at baseline Arousal/Alertness: Awake/alert Orientation Level: Oriented to  person;Oriented to place Year: Other (Comment) (unable to state) Month:  (incorrect) Day of Week: Incorrect Attention: Focused;Sustained Focused Attention:  Impaired Focused Attention Impairment: Verbal basic;Functional basic Sustained Attention: Impaired Sustained Attention Impairment: Verbal basic;Functional basic Memory: Impaired Memory Impairment: Decreased short term memory;Storage deficit;Decreased recall of new information (baseline per wife) Decreased Short Term Memory: Verbal basic;Functional basic Awareness: Impaired Awareness Impairment: Intellectual impairment Problem Solving: Impaired Problem Solving Impairment: Verbal basic Executive Function: Organizing Organizing: Impaired Organizing Impairment: Verbal basic Safety/Judgment: Appears intact  Comprehension Auditory Comprehension Overall Auditory Comprehension: Appears within functional limits for tasks assessed Expression Expression Primary Mode of Expression: Verbal Verbal Expression Overall Verbal Expression: Impaired at baseline Automatic Speech: Name Naming: Impairment Divergent: 0-24% accurate (impacted by decreased focused/sustained attention) Written Expression Dominant Hand: Right Oral Motor Oral Motor/Sensory Function Overall Oral Motor/Sensory Function: Within functional limits Motor Speech Overall Motor Speech: Appears within functional limits for tasks assessed Intelligibility: Intelligible  Care Tool Care Tool Cognition Ability to hear (with hearing aid or hearing appliances if normally used Ability to hear (with hearing aid or hearing appliances if normally used): 0. Adequate - no difficulty in normal conservation, social interaction, listening to TV   Expression of Ideas and Wants Expression of Ideas and Wants: 3. Some difficulty - exhibits some difficulty with expressing needs and ideas (e.g, some words or finishing thoughts) or speech is not clear   Understanding Verbal and Non-Verbal Content Understanding Verbal and Non-Verbal Content: 3. Usually understands - understands most conversations, but misses some part/intent of message. Requires cues at  times to understand  Memory/Recall Ability Memory/Recall Ability : That he or she is in a hospital/hospital unit   Short Term Goals: Week 1: SLP Short Term Goal 1 (Week 1): STG = LTG d/t short stay for ST  Refer to Care Plan for Long Term Goals  Recommendations for other services: None   Discharge Criteria: Patient will be discharged from SLP if patient refuses treatment 3 consecutive times without medical reason, if treatment goals not met, if there is a change in medical status, if patient makes no progress towards goals or if patient is discharged from hospital.  The above assessment, treatment plan, treatment alternatives and goals were discussed and mutually agreed upon: by patient and by family  Dewaine Conger 12/10/2021, 12:27 PM

## 2021-12-10 NOTE — Evaluation (Signed)
Occupational Therapy Assessment and Plan  Patient Details  Name: Patrick [name] MRN: [id number] Date of Birth: October 28, 1926  OT Diagnosis: ataxia and cognitive deficits Rehab Potential: Rehab Potential (ACUTE ONLY): Good ELOS: 10-14 days   Today's Date: 12/10/2021 OT Individual Time: 8003-4917 OT Individual Time Calculation (min): 41 min     Hospital Problem: Principal Problem:   Subarachnoid hematoma with loss of consciousness, sequela ([place])   Past Medical History:  Past Medical History:  Diagnosis Date   Bilateral foot-drop 12/17/2019   BPH (benign prostatic hypertrophy)    Diverticulosis    ED (erectile dysfunction)    Food impaction of esophagus w/mild gastritis 06/28/2021   Gait abnormality 12/17/2019   GERD (gastroesophageal reflux disease)    Gout    H/O multiple concussions--as teenager    Hepatitis    hx hepatitis after mono as teenager   History of kidney stones    History of skin cancer    Hyperlipidemia    Hypertension    Hypothyroidism    IHD (ischemic heart disease)    Remote CABG in 1997   Memory disorder 12/17/2019   MI, acute, non ST segment elevation ([place]) 2010   s/p cath with occluded SVG to PD that fills by collaterals and the remainder of his revasculsarization is satisfactory. "the first time they did the stent , the second time they did the graft 8 vessels"   Nocturia    Schatzki's ring--mild    Thrombocytopenia ([place]) 11/19/2018   Tremor, essential 12/17/2019   Urinary frequency    Past Surgical History:  Past Surgical History:  Procedure Laterality Date   APPENDECTOMY     BALLOON DILATION N/A 03/25/2019   Procedure: BALLOON DILATION;  Surgeon: Otis Brace, MD;  Location: [facility];  Service: Gastroenterology;  Laterality: N/A;   CARDIAC CATHETERIZATION  09/08/2008   NORMAL. EF 60%; Occluded SVG to PD that fills by collaterals and the remainder of his revascularization is satisfactory. he is managed medically.    CORONARY ARTERY  BYPASS GRAFT  1997   CABG x 8   CORONARY STENT INTERVENTION N/A 11/18/2018   Procedure: CORONARY STENT INTERVENTION;  Surgeon: Sherren Mocha, MD;  Location: [facility];  Service: Cardiovascular;  Laterality: N/A;   CYSTOSCOPY WITH INSERTION OF UROLIFT N/A 03/19/2015   Procedure: CYSTOSCOPY WITH INSERTION OF FOUR UROLIFTS;  Surgeon: Carolan Clines, MD;  Location: [facility];  Service: Urology;  Laterality: N/A;   ESOPHAGOGASTRODUODENOSCOPY (EGD) WITH PROPOFOL N/A 02/12/2017   Procedure: ESOPHAGOGASTRODUODENOSCOPY (EGD) WITH PROPOFOL;  Surgeon: Otis Brace, MD;  [facility]: [facility];  Service: Gastroenterology;  Laterality: N/A;   ESOPHAGOGASTRODUODENOSCOPY (EGD) WITH PROPOFOL N/A 07/13/2018   Procedure: ESOPHAGOGASTRODUODENOSCOPY (EGD) WITH PROPOFOL;  Surgeon: Clarene Essex, MD;  [facility]: [facility];  Service: Endoscopy;  Laterality: N/A;   ESOPHAGOGASTRODUODENOSCOPY (EGD) WITH PROPOFOL N/A 03/25/2019   Procedure: ESOPHAGOGASTRODUODENOSCOPY (EGD) WITH PROPOFOL;  Surgeon: Otis Brace, MD;  [facility]: [facility];  Service: Gastroenterology;  Laterality: N/A;   ESOPHAGOGASTRODUODENOSCOPY (EGD) WITH PROPOFOL N/A 06/28/2021   Procedure: ESOPHAGOGASTRODUODENOSCOPY (EGD) WITH PROPOFOL;  Surgeon: Wilford Corner, MD;  [facility]: [facility];  Service: Endoscopy;  Laterality: N/A;   FOREIGN BODY REMOVAL N/A 07/13/2018   Procedure: FOREIGN BODY REMOVAL;  Surgeon: Clarene Essex, MD;  [facility]: [facility];  Service: Endoscopy;  Laterality: N/A;   IMPACTION REMOVAL  06/28/2021   Procedure: IMPACTION REMOVAL;  Surgeon: Wilford Corner, MD;  Location: [facility];  Service: Endoscopy;;   LEFT HEART CATH AND CORS/GRAFTS ANGIOGRAPHY N/A 11/18/2018  Procedure: LEFT HEART CATH AND CORS/GRAFTS ANGIOGRAPHY;  Surgeon: Sherren Mocha, MD;  Location: [facility];  Service: Cardiovascular;  Laterality: N/A;    Assessment & Plan Clinical Impression: Patient is a 88 y.o. right-handed male  with history of HTN, peripheral neuropathy with bilateral foot drop, CAD s/p CABG/PTCA, dementia, Vitamin B12 deficiency, chronic LBP w/neurogenic claudication (Dr. Maia Petties), multiple falls (unable to tolerate outpatient PT due to orthostatic changes), seizure type activity after fall 5/23 and recent admission 11/07/04/28 for lethargy with  metabolic encephalopathy and slurred speech secondary to [place] Corner in setting of thrombocytopenia. He was also found to have severe stenosis of distal BA and bilateral PCA. He was  discharged to home with HHPT/HHOT and referred to Dr. Marin Olp  07/24 who felt that patient with mild ITP. While there, wife reported lethargy with excessive sleep, poor intake as well as recurrent falls therefore was sent to ED for evaluation. CT head showed trace foci of SAH in right frontal, interval development of left frontal hypodensity along gray white matter junction as well as chronic bilateral hygromas. Per chart, NS was consulted and did not feel intervention or repeat CT head needed.  Patient transferred to CIR on 12/09/2021 .    Patient currently requires minimal - moderate assist with basic self-care skills and moderate assistance for transfers (prior to skilled intervention) secondary to ataxia and decreased coordination and decreased problem solving, decreased safety awareness, and decreased memory.  Prior to hospitalization, patient could complete ADL's and mobility independently - modified independence per spouse report (pt intermittently used walker). [name] reports that pt was participating in IADLs such as laundry at baseline.   Patient will benefit from skilled intervention to decrease level of assist with basic self-care skills and increase independence with basic self-care skills prior to discharge home with care partner.  Anticipate patient will require 24 hour supervision and follow up home health for potential for home evaluation to decrease fall risks. [name] reports  that pt has had various falls within the home.   OT - End of Session Activity Tolerance: Tolerates 30+ min activity with multiple rests Endurance Deficit: Yes Endurance Deficit Description: Pt reports minimal fatigue after completing ADLs. OT Assessment Rehab Potential (ACUTE ONLY): Good OT Patient demonstrates impairments in the following area(s): Balance;Perception;Safety;Cognition;Endurance;Motor OT Basic ADL's Functional Problem(s): Grooming;Bathing;Dressing;Toileting OT Advanced ADL's Functional Problem(s): Laundry;Simple Meal Preparation OT Transfers Functional Problem(s): Toilet;Tub/Shower OT Additional Impairment(s): Fuctional Use of Upper Extremity (minimal ataxia) OT Plan OT Intensity: Minimum of 1-2 x/day, 45 to 90 minutes OT Frequency: 5 out of 7 days OT Duration/Estimated Length of Stay: 10-14 days OT Treatment/Interventions: Balance/vestibular training;Discharge planning;Self Care/advanced ADL retraining;Therapeutic Activities;UE/LE Coordination activities;Functional mobility training;Patient/family education;Therapeutic Exercise;Community reintegration;DME/adaptive equipment instruction;Neuromuscular re-education;Psychosocial support;UE/LE Strength taining/ROM OT Self Feeding Anticipated Outcome(s): modified independence OT Basic Self-Care Anticipated Outcome(s): supervision OT Toileting Anticipated Outcome(s): supervision OT Bathroom Transfers Anticipated Outcome(s): supervision OT Recommendation Patient destination: Home Follow Up Recommendations: Home health OT (potential for home eval) Equipment Recommended: To be determined   OT Evaluation Precautions/Restrictions  Precautions Precautions: Fall Precaution Comments: monitor BP (orthostatic hypotension), minimal impulsivity Restrictions Weight Bearing Restrictions: No General Chart Reviewed: Yes Family/Caregiver Present: Yes (spouse present) Vital Signs  Pain Pain Assessment Pain Scale: 0-10 Pain Score:  0-No pain Home Living/Prior Functioning Home Living Family/patient expects to be discharged to:: Private residence Living Arrangements: Spouse/significant other Available Help at Discharge: Family, Available 24 hours/day Type of Home: House Home Access: Stairs to enter [employer] of Steps: 2 Entrance Stairs-Rails:  Right Home Layout: Two level Alternate Level Stairs-Number of Steps: Able to stay on main level Alternate Level Stairs-Rails: Right Bathroom Shower/Tub: Walk-in shower, Door Bathroom Toilet: Handicapped height Bathroom Accessibility: Yes Additional Comments: Pt has RW and rollator.  Lives With: Spouse IADL History Current License: No Prior Function Level of Independence: Independent with transfers, Independent with gait, Needs assistance with homemaking  Able to Take Stairs?: Yes Driving: No Vocation: Retired Leisure:  (enjoysd spending time with animals/pets, enjoys going to [employer]) Vision Baseline Vision/History: 1 Wears glasses Ability to See in Adequate Light: 0 Adequate Patient Visual Report: No change from baseline Vision Assessment?: Vision impaired- to be further tested in functional context;Yes Eye Alignment: Within Functional Limits Perception  Perception: Within Functional Limits Praxis Praxis: Intact Cognition Cognition Overall Cognitive Status: History of cognitive impairments - at baseline Arousal/Alertness: Awake/alert Memory: Appears intact Memory Impairment: Storage deficit;Retrieval deficit;Decreased recall of new information;Decreased short term memory;Decreased long term memory Decreased Short Term Memory: Verbal basic;Verbal complex Attention: Focused;Sustained Focused Attention: Impaired Focused Attention Impairment: Verbal basic;Verbal complex;Functional basic Sustained Attention: Impaired Sustained Attention Impairment: Verbal basic;Functional basic Awareness: Impaired Awareness Impairment: Intellectual  impairment Problem Solving: Impaired Problem Solving Impairment: Verbal basic Executive Function:  (impaired) Organizing: Impaired Organizing Impairment: Verbal basic Safety/Judgment: Impaired Brief Interview for Mental Status (BIMS) Repetition of Three Words (First Attempt): 3 Temporal Orientation: Year: Missed by more than 5 years Temporal Orientation: Month: Missed by more than 1 month Temporal Orientation: Day: Incorrect Recall: "Sock": No, could not recall Recall: "Blue": No, could not recall Recall: "Bed": No, could not recall BIMS Summary Score: 3 Sensation Sensation Light Touch: Appears Intact Additional Comments: Pt reports no sensation deficits at this time. Coordination Gross Motor Movements are Fluid and Coordinated: No Fine Motor Movements are Fluid and Coordinated: No Coordination and Movement Description: Pt presents with mild ataxia whiwch impacts gross motor and fine motor skills. 9 Hole Peg Test: TBD Motor  Motor Motor: Motor perseverations Motor - Skilled Clinical Observations: mild ataxia noted with mobility and perseverations noted with ADLs (repeatedly washing the same area during bathing task)  Trunk/Postural Assessment  Cervical Assessment Cervical Assessment: Within Functional Limits Thoracic Assessment Thoracic Assessment: Within Functional Limits Lumbar Assessment Lumbar Assessment: Within Functional Limits Postural Control Postural Control: Deficits on evaluation (delayed postural reactions)  Balance Balance Balance Assessed: Yes (grossly assessed during ADLs) Static Sitting Balance Static Sitting - Balance Support: Feet supported Static Sitting - Level of Assistance: 5: Stand by assistance Dynamic Sitting Balance Dynamic Sitting - Balance Support: Feet supported Dynamic Sitting - Level of Assistance: 4: Min assist (minimal assist - CGA) Static Standing Balance Static Standing - Balance Support: During functional activity Static Standing -  Level of Assistance: 4: Min assist Dynamic Standing Balance Dynamic Standing - Balance Support: During functional activity Dynamic Standing - Level of Assistance: 4: Min assist Dynamic Standing - Comments: Pt requires minimal assistance to ensure standing balance due to posterior leaning with LB dressing tasks. Extremity/Trunk Assessment RUE Assessment RUE Assessment: Within Functional Limits Active Range of Motion (AROM) Comments: Pt able to complete ADLs with functional ROM. General Strength Comments: Pt able to complete ADLs with functional strength. LUE Assessment LUE Assessment: Within Functional Limits Active Range of Motion (AROM) Comments: Pt able to complete ADLs with functional ROM. General Strength Comments: Pt able to complete ADLs with functional strength.  Care Tool Care Tool Self Care Eating   Eating Assist Level: Supervision/Verbal cueing    Oral Care    Oral Care Assist  Level: Minimal Assistance - Patient > 75%    Bathing   Body parts bathed by patient: Right arm;Left arm;Chest;Abdomen;Front perineal area;Buttocks;Right upper leg;Face;Left lower leg;Right lower leg;Left upper leg     Assist Level: Contact Guard/Touching assist    Upper Body Dressing(including orthotics)   What is the patient wearing?: Pull over shirt   Assist Level: Minimal Assistance - Patient > 75%    Lower Body Dressing (excluding footwear)   What is the patient wearing?: Pants;Incontinence brief Assist for lower body dressing: Maximal Assistance - Patient 25 - 49%    Putting on/Taking off footwear     Assist for footwear: Moderate Assistance - Patient 50 - 74%       Care Tool Toileting Toileting activity   Assist for toileting: Moderate Assistance - Patient 50 - 74%     Care Tool Bed Mobility Roll left and right activity   Roll left and right assist level: Minimal Assistance - Patient > 75%    Sit to lying activity   Sit to lying assist level: Minimal Assistance - Patient >  75%    Lying to sitting on side of bed activity   Lying to sitting on side of bed assist level: the ability to move from lying on the back to sitting on the side of the bed with no back support.: Minimal Assistance - Patient > 75%     Care Tool Transfers Sit to stand transfer   Sit to stand assist level: Moderate Assistance - Patient 50 - 74%    Chair/bed transfer   Chair/bed transfer assist level: Moderate Assistance - Patient 50 - 74%     Toilet transfer   Assist Level: Moderate Assistance - Patient 50 - 74%     Care Tool Cognition  Expression of Ideas and Wants Expression of Ideas and Wants: 3. Some difficulty - exhibits some difficulty with expressing needs and ideas (e.g, some words or finishing thoughts) or speech is not clear  Understanding Verbal and Non-Verbal Content Understanding Verbal and Non-Verbal Content: 3. Usually understands - understands most conversations, but misses some part/intent of message. Requires cues at times to understand   Memory/Recall Ability Memory/Recall Ability : None of the above were recalled   Refer to Care Plan for Long Term Goals  SHORT TERM GOAL WEEK 1 OT Short Term Goal 1 (Week 1): Pt will complete grooming tasks with CGA standing at the sink with use of LRAD. OT Short Term Goal 2 (Week 1): Pt will complete UB dressing with supervision while seated. OT Short Term Goal 3 (Week 1): Pt will complete LB dressing using AE and LRAD PRN. OT Short Term Goal 4 (Week 1): Pt will complete toilet transfer with CGA using LRAD. OT Short Term Goal 5 (Week 1): Pt will complete toileting tasks with CGA with use of LRAD PRN.  Recommendations for other services: None    Skilled Therapeutic Intervention Pt awake sitting in w/c with spouse present upon OT arrival to the room. Pt reports, "I forgot where we are going to eat after this (shower)." Pt in agreement for OT session.  ADL Upper Body Bathing: Supervision/safety (Pt able to bathe all UB parts with  close supervision with safety and minimal prompts due to perseveration noted with continuously bathing BUE and trunk while seated on tub-bench.) Where Assessed-Upper Body Bathing: Shower Lower Body Bathing: Minimal cueing;Contact guard (Pt able to bathe all LB parts with CGA for sitting balance with leaning to bathe B feet and peria areas  while seated on the tub-bench. Pt requires minimal prompts for proper sequencing of task.) Where Assessed-Lower Body Bathing: Shower Upper Body Dressing: Minimal assistance (Pt able to don/doff a pull-over style shirt while seated with minimal assist to full pull down over trunk.) Where Assessed-Upper Body Dressing:  (sitting on toilet and tub-bench before/after shower) Lower Body Dressing: Moderate assistance (Pt requires intermittent touching to fully thread/unthread B feet through shorts and to pull clothing up down with minimal assist to ensure standing balance with use of FWW.) Where Assessed-Lower Body Dressing:  (sitting on toilet and tub-bench before/after shower) Toileting: Moderate assistance (Pt able to complete 1/3 portions of task to perform toileting hygiene. Pt requires assist to perform clothing management with minimal assist for standing balance with use of FWW.) Where Assessed-Toileting: Glass blower/designer: Minimal assistance;Moderate verbal cueing (Pt able to complete ambulatory transfer from EOB > toilet with minimal assist and moderate prompts for safe walker management. Pt does require minimal assist to decrease impact on descend to toilet and for force production to stand with use of grab bars.) Toilet Transfer Method: Insurance claims handler Equipment: Emergency planning/management officer Transfer: Minimal assistance;Moderate cueing (Pt able to complete ambulatory transfer from toilet > tub-bench with use of FWW, minimal assist, and minimal prompts for safe walker management.) Social research officer, government Method: Heritage manager:  Grab bars;Transfer tub bench ADL Comments: Pt participates well in ADLs with minimal prompts for sequencing and to decrease perseverations with bathing. Pt requires moderate VCs for safe walker management with ambulatory transfers. Pt able to perform ambulatory transfer from tub-bench > EO and return to supine with minimal assistance. Mobility  Bed Mobility Bed Mobility: Supine to Sit;Sit to Supine Supine to Sit: Minimal Assistance - Patient > 75% Sit to Supine: Minimal Assistance - Patient > 75% Transfers Sit to Stand: Minimal Assistance - Patient > 75% Stand to Sit: Minimal Assistance - Patient > 75%   Pt returned to bed at end of session. Pt left resting comfortably in bed with personal belongings and call light within reach, bed alarm on and activated, bed in low position, 3 bed rails up, and comfort needs attended to.    Discharge Criteria: Patient will be discharged from OT if patient refuses treatment 3 consecutive times without medical reason, if treatment goals not met, if there is a change in medical status, if patient makes no progress towards goals or if patient is discharged from hospital.  The above assessment, treatment plan, treatment alternatives and goals were discussed and mutually agreed upon: by patient and by family  [name] 12/10/2021, 5:41 PM

## 2021-12-10 NOTE — Progress Notes (Signed)
Initial Nutrition Assessment  DOCUMENTATION CODES:   Not applicable  INTERVENTION:   Liberalize pt diet to regular due to recent weight loss. Encourage good PO intake  Multivitamin w/ minerals daily Consider adding bowel regimen due to no BM x 4 days.  NUTRITION DIAGNOSIS:   Increased nutrient needs related to acute illness as evidenced by estimated needs.  GOAL:   Patient will meet greater than or equal to 90% of their needs  MONITOR:   PO intake, Supplement acceptance, Labs, Weight trends, I & O's  REASON FOR ASSESSMENT:   Malnutrition Screening Tool    ASSESSMENT:   88 y.o. male admitted to CIR after SAH. PMH includes HTN, CKD IIIa, HLD, CAD, and recent ICH.   Spoke with pt wife. States that his appetite has been great at home. Loves his sweets. Does not drink much water. Reports that he typically eats 2 meals per day.  Lunch: sandwich, soup, and fruit Dinner: something bigger Per EMR, pt ate 50% of his dinner last night.  His UBW many years ago was 180-185#, over the past few years his UBW dropped to 150152#. Per EMR, pt has had a 14% weight loss within 2 months, this is clinically significant for time frame.   Wife shares the pt does not like Ensure or Boost, they have tried them in the past.   Medications reviewed and include: Remeron, Protonix Labs reviewed.   NUTRITION - FOCUSED PHYSICAL EXAM:  Deferred to follow-up due to RD working remotely.  Diet Order:   Diet Order             Diet regular Room service appropriate? Yes with Assist; Fluid consistency: Thin  Diet effective now                   EDUCATION NEEDS:   Not appropriate for education at this time  Skin:  Skin Assessment: Reviewed RN Assessment  Last BM:  7/25  Height:   Ht Readings from Last 1 Encounters:  12/09/21 6' (1.829 m)    Weight:   Wt Readings from Last 1 Encounters:  12/09/21 62.6 kg    Ideal Body Weight:  80.9 kg  BMI:  Body mass index is 18.7  kg/m.  Estimated Nutritional Needs:   Kcal:  1700-1900  Protein:  85-100 grams  Fluid:  >/= 1.7 L    Hermina Barters RD, LDN Clinical Dietitian See [facility] for contact information.

## 2021-12-10 NOTE — Evaluation (Signed)
Physical Therapy Assessment and Plan  Patient Details  Name: Patrick [name] MRN: [id number] Date of Birth: 12/13/1926  PT Diagnosis: Abnormality of gait, Difficulty walking, and Muscle weakness Rehab Potential: Good ELOS: 10-14 Days   Today's Date: 12/10/2021 PT Individual Time: 1300-1410 PT Individual Time Calculation (min): 70 min    Hospital Problem: Principal Problem:   Subarachnoid hematoma with loss of consciousness, sequela ([place])   Past Medical History:  Past Medical History:  Diagnosis Date   Bilateral foot-drop 12/17/2019   BPH (benign prostatic hypertrophy)    Diverticulosis    ED (erectile dysfunction)    Food impaction of esophagus w/mild gastritis 06/28/2021   Gait abnormality 12/17/2019   GERD (gastroesophageal reflux disease)    Gout    H/O multiple concussions--as teenager    Hepatitis    hx hepatitis after mono as teenager   History of kidney stones    History of skin cancer    Hyperlipidemia    Hypertension    Hypothyroidism    IHD (ischemic heart disease)    Remote CABG in 1997   Memory disorder 12/17/2019   MI, acute, non ST segment elevation ([place]) 2010   s/p cath with occluded SVG to PD that fills by collaterals and the remainder of his revasculsarization is satisfactory. "the first time they did the stent , the second time they did the graft 8 vessels"   Nocturia    Schatzki's ring--mild    Thrombocytopenia ([place]) 11/19/2018   Tremor, essential 12/17/2019   Urinary frequency    Past Surgical History:  Past Surgical History:  Procedure Laterality Date   APPENDECTOMY     BALLOON DILATION N/A 03/25/2019   Procedure: BALLOON DILATION;  Surgeon: Otis Brace, MD;  Location: [facility];  Service: Gastroenterology;  Laterality: N/A;   CARDIAC CATHETERIZATION  09/08/2008   NORMAL. EF 60%; Occluded SVG to PD that fills by collaterals and the remainder of his revascularization is satisfactory. he is managed medically.    CORONARY ARTERY BYPASS  GRAFT  1997   CABG x 8   CORONARY STENT INTERVENTION N/A 11/18/2018   Procedure: CORONARY STENT INTERVENTION;  Surgeon: Sherren Mocha, MD;  Location: [facility];  Service: Cardiovascular;  Laterality: N/A;   CYSTOSCOPY WITH INSERTION OF UROLIFT N/A 03/19/2015   Procedure: CYSTOSCOPY WITH INSERTION OF FOUR UROLIFTS;  Surgeon: Carolan Clines, MD;  Location: [facility];  Service: Urology;  Laterality: N/A;   ESOPHAGOGASTRODUODENOSCOPY (EGD) WITH PROPOFOL N/A 02/12/2017   Procedure: ESOPHAGOGASTRODUODENOSCOPY (EGD) WITH PROPOFOL;  Surgeon: Otis Brace, MD;  [facility]: [facility];  Service: Gastroenterology;  Laterality: N/A;   ESOPHAGOGASTRODUODENOSCOPY (EGD) WITH PROPOFOL N/A 07/13/2018   Procedure: ESOPHAGOGASTRODUODENOSCOPY (EGD) WITH PROPOFOL;  Surgeon: Clarene Essex, MD;  [facility];  Service: Endoscopy;  Laterality: N/A;   ESOPHAGOGASTRODUODENOSCOPY (EGD) WITH PROPOFOL N/A 03/25/2019   Procedure: ESOPHAGOGASTRODUODENOSCOPY (EGD) WITH PROPOFOL;  Surgeon: Otis Brace, MD;  [facility];  Service: Gastroenterology;  Laterality: N/A;   ESOPHAGOGASTRODUODENOSCOPY (EGD) WITH PROPOFOL N/A 06/28/2021   Procedure: ESOPHAGOGASTRODUODENOSCOPY (EGD) WITH PROPOFOL;  Surgeon: Wilford Corner, MD;  [facility];  Service: Endoscopy;  Laterality: N/A;   FOREIGN BODY REMOVAL N/A 07/13/2018   Procedure: FOREIGN BODY REMOVAL;  Surgeon: Clarene Essex, MD;  [facility];  Service: Endoscopy;  Laterality: N/A;   IMPACTION REMOVAL  06/28/2021   Procedure: IMPACTION REMOVAL;  Surgeon: Wilford Corner, MD;  [facility];  Service: Endoscopy;;   LEFT HEART CATH AND CORS/GRAFTS ANGIOGRAPHY N/A 11/18/2018   Procedure:  LEFT HEART CATH AND CORS/GRAFTS ANGIOGRAPHY;  Surgeon: Sherren Mocha, MD;  Location: [facility];  Service: Cardiovascular;  Laterality: N/A;    Assessment & Plan Clinical Impression: Patient is a 88 year old male with history of  HTN, peripheral neuropathy with bilateral foot drop, CAD s/p CABG/PTCA, dementia, Vitamin B12 deficiency, chronic LBP w/neurogenic claudication (Dr. Maia [name]), multiple falls (unable to tolerate outpatient PT due to orthostatic changes), seizure type activity after fall 5/23 and recent admission 11/07/04/28 for lethargy with  metabolic encephalopathy and slurred speech secondary to Patrick [name] in setting of thrombocytopenia. He was also found to have severe stenosis of distal BA and bilateral PCA. He was  discharged to home with HHPT/HHOT and referred to Dr. Marin [name]  07/24 who felt that patient with mild ITP. While there, wife reported lethargy with excessive sleep, poor intake as well as recurrent falls therefore was sent to ED for evaluation. CT head showed trace foci of SAH in right frontal, interval development of left frontal hypodensity along gray white matter junction as well as chronic bilateral hygromas. Per chart, NS was consulted and did not feel intervention or repeat CT head needed.   BP noted to be elevated was treated with IV labetalol initially.   Wife notes that he typically sleeps between 9pm and noon so may have a hard tim tolerating morning therapies.     Patient transferred to CIR on 12/09/2021 .   Patient currently requires min with mobility secondary to muscle weakness, decreased cardiorespiratoy endurance, ataxia, decreased coordination, and decreased motor planning, decreased awareness, decreased problem solving, decreased safety awareness, and decreased memory, and decreased sitting balance, decreased standing balance, decreased postural control, and decreased balance strategies.  Prior to hospitalization, patient was independent  with mobility and lived with Spouse in a House home.  Home access is 2Stairs to enter.  Patient will benefit from skilled PT intervention to maximize safe functional mobility, minimize fall risk, and decrease caregiver burden for planned discharge home with 24 hour  supervision.  Anticipate patient will benefit from follow up Patrick [name] at discharge.  PT - End of Session Activity Tolerance: Tolerates 30+ min activity with multiple rests Endurance Deficit: Yes PT Assessment Rehab Potential (ACUTE/IP ONLY): Good PT Patient demonstrates impairments in the following area(s): Balance;Endurance;Motor;Perception;Safety PT Transfers Functional Problem(s): Bed Mobility;Car;Bed to Chair;Furniture PT Locomotion Functional Problem(s): Ambulation;Stairs PT Plan PT Intensity: Minimum of 1-2 x/day ,45 to 90 minutes PT Frequency: 5 out of 7 days PT Duration Estimated Length of Stay: 10-14 Days PT Treatment/Interventions: Ambulation/gait training;Community reintegration;DME/adaptive equipment instruction;Neuromuscular re-education;Psychosocial support;Stair training;UE/LE Strength taining/ROM;Balance/vestibular training;Discharge planning;Functional electrical stimulation;Pain management;Skin care/wound management;UE/LE Coordination activities;Therapeutic Activities;Cognitive remediation/compensation;Disease management/prevention;Functional mobility training;Patient/family education;Splinting/orthotics;Therapeutic Exercise;Visual/perceptual remediation/compensation;Wheelchair propulsion/positioning PT Transfers Anticipated Outcome(s): Supervision PT Locomotion Anticipated Outcome(s): Supervision PT Recommendation Follow Up Recommendations: Home health PT;24 hour supervision/assistance Patient destination: Home Equipment Recommended: To be determined   PT Evaluation Precautions/Restrictions Precautions Precautions: Fall Precaution Comments: watch BP, orthostatic Restrictions Weight Bearing Restrictions: No General Chart Reviewed: Yes Family/Caregiver Present: Yes Vital SignsTherapy Vitals Temp: 98.8 F (37.1 C) Temp Source: Oral Pulse Rate: 88 Resp: 18 BP: (!) 167/69 Patient Position (if appropriate): Lying Oxygen Therapy SpO2: 100 % O2 Device: Room  Air Pain Pain Assessment Pain Scale: 0-10 Pain Score: 0-No pain Pain Interference Pain Interference Pain Effect on Sleep: 1. Rarely or not at all Pain Interference with Therapy Activities: 1. Rarely or not at all Pain Interference with Day-to-Day Activities: 1. Rarely or not at all [place] Available Help  at Discharge: Family;Available 24 hours/day Type of Home: House Home Access: Stairs to enter [employer] of Steps: 2 Entrance Stairs-Rails: Right Home Layout: Two level Alternate Level Stairs-Number of Steps: Able to stay on main level Alternate Level Stairs-Rails: Right Bathroom Shower/Tub: Walk-in shower;Door Bathroom Toilet: Handicapped height Bathroom Accessibility: Yes Additional Comments: Pt has RW and rollator.  Lives With: Spouse Prior Function Level of Independence: Independent with transfers;Independent with gait;Needs assistance with homemaking  Able to Take Stairs?: Yes Driving: No Vocation: Retired Leisure:  (enjoysd spending time with animals/pets, enjoys going to [employer]) Vision/Perception  Vision - History Ability to See in Adequate Light: 0 Adequate Vision - Assessment Eye Alignment: Within Designer, television/film set Perception: Within Functional Limits Praxis Praxis: Intact  Cognition Overall Cognitive Status: History of cognitive impairments - at baseline Arousal/Alertness: Awake/alert Orientation Level: Oriented to person;Oriented to place Year: Other (Comment) (unable to state) Month:  (incorrect) Day of Week: Incorrect Attention: Focused;Sustained Focused Attention: Impaired Focused Attention Impairment: Verbal basic;Verbal complex;Functional basic Sustained Attention: Impaired Sustained Attention Impairment: Verbal basic;Functional basic Memory: Appears intact Memory Impairment: Storage deficit;Retrieval deficit;Decreased recall of new information;Decreased short term memory;Decreased  long term memory Decreased Short Term Memory: Verbal basic;Verbal complex Awareness: Impaired Awareness Impairment: Intellectual impairment Problem Solving: Impaired Problem Solving Impairment: Verbal basic Executive Function:  (impaired) Organizing: Impaired Organizing Impairment: Verbal basic Safety/Judgment: Impaired Sensation Sensation Light Touch: Appears Intact Additional Comments: Pt reports no sensation deficits at this time. Coordination Gross Motor Movements are Fluid and Coordinated: No Fine Motor Movements are Fluid and Coordinated: No Coordination and Movement Description: Pt presents with mild ataxia whiwch impacts gross motor and fine motor skills. 9 Hole Peg Test: TBD Motor  Motor Motor: Ataxia Motor - Skilled Clinical Observations: Mild   Trunk/Postural Assessment  Cervical Assessment Cervical Assessment: Exceptions to [facility] (forward head) Thoracic Assessment Thoracic Assessment: Exceptions to [facility] (rounded shoulders) Lumbar Assessment Lumbar Assessment: Exceptions to [facility] (posterior pelvic tilt) Postural Control Postural Control: Deficits on evaluation (delayed and inadequate)  Balance Balance Balance Assessed: Yes Static Sitting Balance Static Sitting - Balance Support: Feet supported Static Sitting - Level of Assistance: 5: Stand by assistance Dynamic Sitting Balance Dynamic Sitting - Balance Support: Feet supported Dynamic Sitting - Level of Assistance: 4: Min assist Static Standing Balance Static Standing - Balance Support: During functional activity Static Standing - Level of Assistance: 4: Min assist Dynamic Standing Balance Dynamic Standing - Balance Support: During functional activity Dynamic Standing - Level of Assistance: 4: Min assist Extremity Assessment  RUE Assessment RUE Assessment: Within Functional Limits LUE Assessment LUE Assessment: Within Functional Limits RLE Assessment General Strength Comments: Grossly 4/5 LLE  Assessment General Strength Comments: Grossly 4/5  Care Tool Care Tool Bed Mobility Roll left and right activity   Roll left and right assist level: Minimal Assistance - Patient > 75%    Sit to lying activity   Sit to lying assist level: Minimal Assistance - Patient > 75%    Lying to sitting on side of bed activity   Lying to sitting on side of bed assist level: the ability to move from lying on the back to sitting on the side of the bed with no back support.: Minimal Assistance - Patient > 75%     Care Tool Transfers Sit to stand transfer   Sit to stand assist level: Moderate Assistance - Patient 50 - 74%    Chair/bed transfer   Chair/bed transfer assist level: Moderate Assistance - Patient 50 - 74%  Toilet transfer   Assist Level: Moderate Assistance - Patient 50 - 74%    Car transfer   Car transfer assist level: Moderate Assistance - Patient 50 - 74%      Care Tool Locomotion Ambulation   Assist level: Minimal Assistance - Patient > 75% Assistive device: No Device Max distance: 150'  Walk 10 feet activity   Assist level: Minimal Assistance - Patient > 75% Assistive device: No Device   Walk 50 feet with 2 turns activity   Assist level: Minimal Assistance - Patient > 75% Assistive device: No Device  Walk 150 feet activity   Assist level: Minimal Assistance - Patient > 75% Assistive device: No Device  Walk 10 feet on uneven surfaces activity   Assist level: Minimal Assistance - Patient > 75%    Stairs   Assist level: Minimal Assistance - Patient > 75% Stairs assistive device: 2 hand rails Max number of stairs: 12  Walk up/down 1 step activity   Walk up/down 1 step (curb) assist level: Minimal Assistance - Patient > 75% Walk up/down 1 step or curb assistive device: 2 hand rails  Walk up/down 4 steps activity   Walk up/down 4 steps assist level: Minimal Assistance - Patient > 75% Walk up/down 4 steps assistive device: 2 hand rails  Walk up/down 12 steps  activity   Walk up/down 12 steps assist level: Minimal Assistance - Patient > 75% Walk up/down 12 steps assistive device: 2 hand rails  Pick up small objects from floor   Pick up small object from the floor assist level: Moderate Assistance - Patient 50 - 74%    Wheelchair Is the patient using a wheelchair?: No          Wheel 50 feet with 2 turns activity      Wheel 150 feet activity        Refer to Care Plan for Long Term Goals  SHORT TERM GOAL WEEK 1 PT Short Term Goal 1 (Week 1): Pt will perform bed mobility with CGA. PT Short Term Goal 2 (Week 1): Pt will completes bed to chair transfer consistently with CGA. PT Short Term Goal 3 (Week 1): Pt will ambulate x150' with CGA and LRAD. PT Short Term Goal 4 (Week 1): Pt will complete x2 steps with R hand rail and CGA  Recommendations for other services: None   Skilled Therapeutic Intervention  Evaluation completed (see details above and below) with education on PT POC and goals and individual treatment initiated with focus on bed mobility, balance, transfers, ambulation, and stair training. Pt received supine in bed and agrees to therapy. No complaint of pain. BP assessed in supine 166/79. Pt completes supine to sit with minA and cues for sequencing and positioning. In sitting, BP 164/77. Pt stands with modA and cues for body mechanics, initiation, and sequencing. PT provides minA for stability while pt stands and BP taken 144/75. Pt does not verbalize any orthostatic symptoms. Pt performs stand step transfer to [facility] with modA and cues for sequencing, positioning, and hand placement. WC transport to gym for time management. Pt performs car transfer with modA and cues for sequencing. Pt then ambulates x150' without AD, with minA and tactile cues at trunk for posture, with multimodal cueing for increased stride length and gait speed to decrease risk for falls. Pt has very short strides initially but is able to substantially increase stride  length and gait speed with cueing. Following seated rest break, pt completes x12 6" steps with minA  and cues for step sequencing. Pt completes ramp navigation with minA and same cueing, in addition to increase step height. Pt left seated in WC with all needs within reach.  Mobility Bed Mobility Bed Mobility: Supine to Sit;Sit to Supine Supine to Sit: Minimal Assistance - Patient > 75% Sit to Supine: Minimal Assistance - Patient > 75% Transfers Transfers: Sit to Stand;Stand to Sit Sit to Stand: Minimal Assistance - Patient > 75% Stand to Sit: Minimal Assistance - Patient > 75% Transfer (Assistive device): None Locomotion  Gait Ambulation: Yes Gait Assistance: Minimal Assistance - Patient > 75% Gait Distance (Feet): 150 Feet Assistive device: None Gait Assistance Details: Verbal cues for technique;Tactile cues for posture;Verbal cues for sequencing;Verbal cues for gait pattern Gait Gait: Yes Gait Pattern: Impaired Gait Pattern: Decreased stride length Gait velocity: Decreased Stairs / Additional Locomotion Stairs: Yes Stairs Assistance: Minimal Assistance - Patient > 75% Stair Management Technique: Two rails Number of Stairs: 12 Height of Stairs: 6 Ramp: Minimal Assistance - Patient >75% Curb: Minimal Assistance - Patient >75% Wheelchair Mobility Wheelchair Mobility: No   Discharge Criteria: Patient will be discharged from PT if patient refuses treatment 3 consecutive times without medical reason, if treatment goals not met, if there is a change in medical status, if patient makes no progress towards goals or if patient is discharged from hospital.  The above assessment, treatment plan, treatment alternatives and goals were discussed and mutually agreed upon: by patient  Breck Coons, PT, DPT 12/10/2021, 4:08 PM

## 2021-12-10 NOTE — Progress Notes (Signed)
PROGRESS NOTE   Subjective/Complaints:  +fatigue Had a good night Wife at bedside Reviewed labs with them  ROS: + fatigue  Objective:   No results found. Recent Labs    12/10/21 0518  WBC 8.7  HGB 13.3  HCT 39.8  PLT 89*   Recent Labs    12/10/21 0518  NA 139  K 3.8  CL 104  CO2 27  GLUCOSE 112*  BUN 18  CREATININE 1.17  CALCIUM 8.6*    Intake/Output Summary (Last 24 hours) at 12/10/2021 1452 Last data filed at 12/10/2021 1320 Gross per 24 hour  Intake 480 ml  Output --  Net 480 ml        Physical Exam: Vital Signs Blood pressure (!) 167/69, pulse 88, temperature 98.8 F (37.1 C), temperature source Oral, resp. rate 18, height 6' (1.829 m), weight 62.6 kg, SpO2 100 %. Gen: no distress, normal appearing HEENT: oral mucosa pink and moist, NCAT Cardio: Reg rate Chest: normal effort, normal rate of breathing Abd: soft, non-distended Ext: no edema Psych: pleasant, normal affect, joking Skin: intact Neuro: Alert. MMT intact.    Assessment/Plan: 1. Functional deficits which require 3+ hours per day of interdisciplinary therapy in a comprehensive inpatient rehab setting. Physiatrist is providing close team supervision and 24 hour management of active medical problems listed below. Physiatrist and rehab team continue to assess barriers to discharge/monitor patient progress toward functional and medical goals  Care Tool:  Bathing              Bathing assist       Upper Body Dressing/Undressing Upper body dressing        Upper body assist      Lower Body Dressing/Undressing Lower body dressing            Lower body assist       Toileting Toileting    Toileting assist       Transfers Chair/bed transfer  Transfers assist           Locomotion Ambulation   Ambulation assist              Walk 10 feet activity   Assist           Walk 50 feet  activity   Assist           Walk 150 feet activity   Assist           Walk 10 feet on uneven surface  activity   Assist           Wheelchair     Assist               Wheelchair 50 feet with 2 turns activity    Assist            Wheelchair 150 feet activity     Assist          Blood pressure (!) 167/69, pulse 88, temperature 98.8 F (37.1 C), temperature source Oral, resp. rate 18, height 6' (1.829 m), weight 62.6 kg, SpO2 100 %.  Medical Problem List and Plan: 1. Functional deficits secondary to Patrick [name]             -  patient may shower             -ELOS/Goals: 3 weeks             Admit to CIR 2.  Antithrombotics: -DVT/anticoagulation:  Mechanical: Sequential compression devices, below knee Bilateral lower extremities             -antiplatelet therapy: N/A 3. Spinal pain: On depakote for neuropathy (per chart review).             --Used oxycodone 10/325 mg 2-3 times a day.             -scheduling oxycodone at night 4. Mood/Behavior/Sleep: LCSW to follow for evaluation and support.              --Schedule oxycodone 5/325 mg/Hs for pain/sleep.              -antipsychotic agents: N/A 5. Neuropsych/cognition: This patient is not capable of making decisions on his own behalf. 6. Skin/Wound Care: Routine pressure relief measures.  7. Fluids/Electrolytes/Nutrition: Monitor I/O. Encourage fluid intake.  8. HTN w/significant orthostatic changes: Monitor BP TID w/orthostatic vitals             --orthostatic changes have been ongoing for years per records --Continue Proscar, change to bedtime. Change florinef (added 07/28) to 7 am prior to therapy. --Encourage fluid intake.  Binder/TEDs daily when out of bed.  9. CKD?: Will check CMET in am.  10. Mild ITP/Thrombocytopenia: Repeat CBC in am to monitor platelets.              --Monitor for signs of bleeding. Check with Dr. Marin Olp on Monday threshold at which he would provide transfusion, repeat  [facility] Monday 11. CAD: Monitor for symptoms with increase in activity. Has been on florinef in the past.   12. H/o Food impaction/gastritis: Intake has been poor.  --Will order supervision for safety with meals/to assist w/meals.  13. Urgency/Dysuria: UA positive, restart Keflex, f/u UC             --monitor PVRs 14. Fatigue: add on vitamin D level.     LOS: 1 days A FACE TO FACE EVALUATION WAS PERFORMED  Patrick [name] 12/10/2021, 2:52 PM

## 2021-12-10 NOTE — Progress Notes (Addendum)
Patient with PVR volumes >350. Josh, RN attempted I/O cath with 16 fr sterile I/O kit but states that he could not advance catheter. Per chart patient has a history of BPH. Dr. Ranell Patrick notified and approved use of coude catheter. I/O cath performed using sterile technique and 14 fr coude catheter. Bladder emptied succesfully for 300cc. Notable redness and frank red blood noted at urethral opening. Notified Dr. Ranell Patrick of concern for not being able to tolerate frequent I/O caths. Plan to hold off on caths and start flomax per provider.

## 2021-12-11 ENCOUNTER — Inpatient Hospital Stay (HOSPITAL_COMMUNITY): Payer: Medicare Other

## 2021-12-11 DIAGNOSIS — Z8619 Personal history of other infectious and parasitic diseases: Secondary | ICD-10-CM | POA: Diagnosis not present

## 2021-12-11 DIAGNOSIS — T18128D Food in esophagus causing other injury, subsequent encounter: Secondary | ICD-10-CM | POA: Diagnosis not present

## 2021-12-11 DIAGNOSIS — E876 Hypokalemia: Secondary | ICD-10-CM | POA: Diagnosis not present

## 2021-12-11 DIAGNOSIS — I160 Hypertensive urgency: Secondary | ICD-10-CM | POA: Diagnosis not present

## 2021-12-11 DIAGNOSIS — E039 Hypothyroidism, unspecified: Secondary | ICD-10-CM | POA: Diagnosis not present

## 2021-12-11 DIAGNOSIS — I252 Old myocardial infarction: Secondary | ICD-10-CM | POA: Diagnosis not present

## 2021-12-11 DIAGNOSIS — I2510 Atherosclerotic heart disease of native coronary artery without angina pectoris: Secondary | ICD-10-CM | POA: Diagnosis not present

## 2021-12-11 DIAGNOSIS — N529 Male erectile dysfunction, unspecified: Secondary | ICD-10-CM | POA: Diagnosis not present

## 2021-12-11 DIAGNOSIS — G250 Essential tremor: Secondary | ICD-10-CM | POA: Diagnosis not present

## 2021-12-11 DIAGNOSIS — G9340 Encephalopathy, unspecified: Secondary | ICD-10-CM | POA: Diagnosis not present

## 2021-12-11 DIAGNOSIS — K219 Gastro-esophageal reflux disease without esophagitis: Secondary | ICD-10-CM | POA: Diagnosis not present

## 2021-12-11 DIAGNOSIS — Z85828 Personal history of other malignant neoplasm of skin: Secondary | ICD-10-CM | POA: Diagnosis not present

## 2021-12-11 DIAGNOSIS — F0390 Unspecified dementia without behavioral disturbance: Secondary | ICD-10-CM | POA: Diagnosis not present

## 2021-12-11 DIAGNOSIS — N400 Enlarged prostate without lower urinary tract symptoms: Secondary | ICD-10-CM | POA: Diagnosis not present

## 2021-12-11 DIAGNOSIS — K5790 Diverticulosis of intestine, part unspecified, without perforation or abscess without bleeding: Secondary | ICD-10-CM | POA: Diagnosis not present

## 2021-12-11 DIAGNOSIS — R413 Other amnesia: Secondary | ICD-10-CM | POA: Diagnosis not present

## 2021-12-11 DIAGNOSIS — M109 Gout, unspecified: Secondary | ICD-10-CM | POA: Diagnosis not present

## 2021-12-11 DIAGNOSIS — S066X9S Traumatic subarachnoid hemorrhage with loss of consciousness of unspecified duration, sequela: Secondary | ICD-10-CM | POA: Diagnosis not present

## 2021-12-11 DIAGNOSIS — I129 Hypertensive chronic kidney disease with stage 1 through stage 4 chronic kidney disease, or unspecified chronic kidney disease: Secondary | ICD-10-CM | POA: Diagnosis not present

## 2021-12-11 DIAGNOSIS — M21372 Foot drop, left foot: Secondary | ICD-10-CM | POA: Diagnosis not present

## 2021-12-11 DIAGNOSIS — N179 Acute kidney failure, unspecified: Secondary | ICD-10-CM | POA: Diagnosis not present

## 2021-12-11 DIAGNOSIS — M21371 Foot drop, right foot: Secondary | ICD-10-CM | POA: Diagnosis not present

## 2021-12-11 DIAGNOSIS — E785 Hyperlipidemia, unspecified: Secondary | ICD-10-CM | POA: Diagnosis not present

## 2021-12-11 DIAGNOSIS — D696 Thrombocytopenia, unspecified: Secondary | ICD-10-CM | POA: Diagnosis not present

## 2021-12-11 DIAGNOSIS — G8929 Other chronic pain: Secondary | ICD-10-CM | POA: Diagnosis not present

## 2021-12-11 DIAGNOSIS — N1832 Chronic kidney disease, stage 3b: Secondary | ICD-10-CM | POA: Diagnosis not present

## 2021-12-11 LAB — URINE CULTURE: Culture: <10000 — AB

## 2021-12-11 MED ORDER — VITAMIN D 25 MCG (1000 UNIT) PO TABS
1000.0000 [IU] | ORAL_TABLET | Freq: Every day | ORAL | Status: DC
  Administered 2021-12-11 – 2021-12-22 (×12): 1000 [IU] via ORAL
  Filled 2021-12-11 (×12): qty 1

## 2021-12-11 NOTE — Progress Notes (Signed)
PROGRESS NOTE   Subjective/Complaints: Had an amazing day with therapy yesterday walking 150 feet and doing 12 stairs, today had a shower but felt much more fatigued, discussed could be from UTI vs. Flomax, discontinued latter  ROS: + fatigue, +increased sight sided weakness  Objective:   CT HEAD WO CONTRAST (5MM)  Result Date: 12/11/2021 CLINICAL DATA:  88 year old male with neurologic deficit. EXAM: CT HEAD WITHOUT CONTRAST TECHNIQUE: Contiguous axial images were obtained from the base of the skull through the vertex without intravenous contrast. RADIATION DOSE REDUCTION: This exam was performed according to the departmental dose-optimization program which includes automated exposure control, adjustment of the mA and/or kV according to patient size and/or use of iterative reconstruction technique. COMPARISON:  Head CT 12/05/2021.  CTA head and neck 11/09/2021. FINDINGS: Brain: Bilateral extra-axial fluid was isointense to CSF by MRI in June, but there is now a small hyperdense component of subdural hematoma on the right (series 3, image 18). But trace midline shift is toward the right side, unchanged from June. Small left superior frontal gyrus hemorrhagic focus has faded since 11/08/2021 with residual on series 3, image 24. And this corresponds to coronal image 34. No regional edema or mass effect. But there is also trace blood products now in the left subdural space such as series 6, image 37. Basilar cisterns remain normal, stable since June. Stable gray-white matter differentiation throughout the brain. No cortically based acute infarct identified. Vascular: Calcified atherosclerosis at the skull base. No suspicious intracranial vascular hyperdensity. Skull: No acute osseous abnormality identified. Sinuses/Orbits: Visualized paranasal sinuses and mastoids are stable and well aerated. Other: No acute orbit or scalp soft tissue finding.  IMPRESSION: 1. Trace hemorrhage has developed within asymmetric bilateral Subdural Hygromas since June. But underlying mild rightward midline shift is unchanged since last month. Consider continued noncontrast CT surveillance. 2. Small left superior frontal gyrus intra-axial hemorrhage has faded since June. 3. No acute or evolving cerebral infarct identified by CT. Electronically Signed   By: Genevie Ann M.D.   On: 12/11/2021 10:33   Recent Labs    12/10/21 0518  WBC 8.7  HGB 13.3  HCT 39.8  PLT 89*   Recent Labs    12/10/21 0518  NA 139  K 3.8  CL 104  CO2 27  GLUCOSE 112*  BUN 18  CREATININE 1.17  CALCIUM 8.6*    Intake/Output Summary (Last 24 hours) at 12/11/2021 1120 Last data filed at 12/11/2021 0813 Gross per 24 hour  Intake 360 ml  Output 300 ml  Net 60 ml        Physical Exam: Vital Signs Blood pressure 136/76, pulse (!) 103, temperature 98.3 F (36.8 C), temperature source Oral, resp. rate 18, height 6' (1.829 m), weight 62.6 kg, SpO2 100 %. Gen: no distress, normal appearing HEENT: oral mucosa pink and moist, NCAT Cardio: Tachycardia Chest: normal effort, normal rate of breathing Abd: soft, non-distended Ext: no edema Psych: pleasant, normal affect, joking Skin: intact Neuro: Alert. MMT intact. 4/5 strength throughout, limited by fatigue   Assessment/Plan: 1. Functional deficits which require 3+ hours per day of interdisciplinary therapy in a comprehensive inpatient rehab setting. Physiatrist is providing close  team supervision and 24 hour management of active medical problems listed below. Physiatrist and rehab team continue to assess barriers to discharge/monitor patient progress toward functional and medical goals  Care Tool:  Bathing    Body parts bathed by patient: Right arm, Left arm, Chest, Abdomen, Front perineal area, Right upper leg, Face, Left lower leg, Right lower leg, Left upper leg   Body parts bathed by helper: Buttocks     Bathing  assist Assist Level: Minimal Assistance - Patient > 75%     Upper Body Dressing/Undressing Upper body dressing   What is the patient wearing?: Pull over shirt    Upper body assist Assist Level: Contact Guard/Touching assist    Lower Body Dressing/Undressing Lower body dressing      What is the patient wearing?: Incontinence brief     Lower body assist Assist for lower body dressing: Maximal Assistance - Patient 25 - 49%     Toileting Toileting    Toileting assist Assist for toileting: Moderate Assistance - Patient 50 - 74%     Transfers Chair/bed transfer  Transfers assist     Chair/bed transfer assist level: Moderate Assistance - Patient 50 - 74%     Locomotion Ambulation   Ambulation assist      Assist level: Minimal Assistance - Patient > 75% Assistive device: No Device Max distance: 150'   Walk 10 feet activity   Assist     Assist level: Minimal Assistance - Patient > 75% Assistive device: No Device   Walk 50 feet activity   Assist    Assist level: Minimal Assistance - Patient > 75% Assistive device: No Device    Walk 150 feet activity   Assist    Assist level: Minimal Assistance - Patient > 75% Assistive device: No Device    Walk 10 feet on uneven surface  activity   Assist     Assist level: Minimal Assistance - Patient > 75%     Wheelchair     Assist Is the patient using a wheelchair?: No             Wheelchair 50 feet with 2 turns activity    Assist            Wheelchair 150 feet activity     Assist          Blood pressure 136/76, pulse (!) 103, temperature 98.3 F (36.8 C), temperature source Oral, resp. rate 18, height 6' (1.829 m), weight 62.6 kg, SpO2 100 %.  Medical Problem List and Plan: 1. Functional deficits secondary to [place]             -patient may shower             -ELOS/Goals: 3 weeks             Admit to CIR 2.  Antithrombotics: -DVT/anticoagulation:  Mechanical:  Sequential compression devices, below knee Bilateral lower extremities             -antiplatelet therapy: N/A 3. Spinal pain: On depakote for neuropathy (per chart review).             --Used oxycodone 10/325 mg 2-3 times a day.             -scheduling oxycodone at night 4. Mood/Behavior/Sleep: LCSW to follow for evaluation and support.              --Schedule oxycodone 5/325 mg/Hs for pain/sleep.              -  antipsychotic agents: N/A 5. Neuropsych/cognition: This patient is not capable of making decisions on his own behalf. 6. Skin/Wound Care: Routine pressure relief measures.  7. Fluids/Electrolytes/Nutrition: Monitor I/O. Encourage fluid intake.  8. HTN w/significant orthostatic changes: Monitor BP TID w/orthostatic vitals             --orthostatic changes have been ongoing for years per records --Continue Proscar, change to bedtime. Change florinef (added 07/28) to 7 am prior to therapy. --Encourage fluid intake.  Binder/TEDs daily when out of bed.  9. CKD?: Will check CMET in am.  10. Mild ITP/Thrombocytopenia: Repeat CBC in am to monitor platelets.              --Monitor for signs of bleeding. Check with Dr. Marin Olp on Monday threshold at which he would provide transfusion, repeat [facility] Monday 11. CAD: Monitor for symptoms with increase in activity. Has been on florinef in the past.   12. H/o Food impaction/gastritis: Intake has been poor.  --Will order supervision for safety with meals/to assist w/meals.  13. Urgency/Dysuria: UC + for staph hemolytica- continue keflex for 5 day course 14. Fatigue: Vitamin D level 60, start 1,000U daily supplement.  15. Tachycardia: could be secondary to fludricortisone but this is helping BP, continue to monitor HR TID 16. Transient increased in right sided weakness: CT head obtained and negative for acute pathology, discussed with wife.     LOS: 2 days A FACE TO FACE EVALUATION WAS PERFORMED  Yandiel Bergum P Hildred Pharo 12/11/2021, 11:20 AM

## 2021-12-11 NOTE — Progress Notes (Addendum)
Occupational Therapy Session Note  Patient Details  Name: Patrick [name] MRN: [id number] Date of Birth: 25-Sep-1926  Today's Date: 12/11/2021 OT Individual Time: 0800-0915 OT Individual Time Calculation (min): 75 min    Short Term Goals: Week 1:  OT Short Term Goal 1 (Week 1): Pt will complete grooming tasks with CGA standing at the sink with use of LRAD. OT Short Term Goal 2 (Week 1): Pt will complete UB dressing with supervision while seated. OT Short Term Goal 3 (Week 1): Pt will complete LB dressing using AE and LRAD PRN. OT Short Term Goal 4 (Week 1): Pt will complete toilet transfer with CGA using LRAD. OT Short Term Goal 5 (Week 1): Pt will complete toileting tasks with CGA with use of LRAD PRN.  Skilled Therapeutic Interventions/Progress Updates:    Upon OT arrival, pt semi recumbent in bed with spouse present in recliner. No pain reported. [name] reports pt has been up throughout the night secondary to incontinence. Pt agreeable to OT treatment session and pt's spouse requests a shower. Pt lethargic. Pt's BP stable. Pt completes supine to sit transfer with Min A and sit to stand transfer with RW and Min A. Pt ambulates to bathroom with RW and Min A requiring Mod A once closer to shower chair. Pt completes full shower ADL at the levels below. Pt with noted R hemiparesis and requires verbal cues to initiate and sequence. Pt requires greater assistance for transfers the longer session continues. Attempted to check BP while standing but was unsuccessful. Pt was returned to bed completing squat pivot transfer with Mod A and returns to supine with Min A. Pt was scooted up to [facility] and left in bed with all needs met and MD present at end of session. MD notified of pt's limitations this session.   Therapy Documentation Precautions:  Precautions Precautions: Fall Precaution Comments: monitor BP (orthostatic hypotension), minimal impulsivity Restrictions Weight Bearing Restrictions: No    ADL: ADL Grooming: Minimal assistance Where Assessed-Grooming: Sitting at sink Upper Body Bathing: Contact guard Where Assessed-Upper Body Bathing: Shower Lower Body Bathing: Minimal assistance (buttocks) Where Assessed-Lower Body Bathing: Shower Upper Body Dressing: Contact guard Where Assessed-Upper Body Dressing: Wheelchair Lower Body Dressing: Maximal assistance Where Assessed-Lower Body Dressing: Wheelchair Toileting: Moderate assistance (Pt able to complete 1/3 portions of task to perform toileting hygiene. Pt requires assist to perform clothing management with minimal assist for standing balance with use of FWW.) Where Assessed-Toileting: Glass blower/designer: Minimal assistance, Moderate verbal cueing (Pt able to complete ambulatory transfer from EOB > toilet with minimal assist and moderate prompts for safe walker management. Pt does require minimal assist to decrease impact on descend to toilet and for force production to stand with use of grab bars.) Toilet Transfer Method: Insurance claims handler Equipment: Energy manager: Moderate assistance Social research officer, government Method: Heritage manager: Grab bars, Transfer tub bench ADL Comments: Pt participates well in ADLs with minimal prompts for sequencing and to decrease perseverations with bathing. Pt requires moderate VCs for safe walker management with ambulatory transfers. Pt able to perform ambulatory transfer from tub-bench > EO and return to supine with minimal assistance.   Therapy/Group: Individual Therapy  Marvetta Gibbons 12/11/2021, 10:19 AM

## 2021-12-11 NOTE — Plan of Care (Signed)
  Problem: RH BLADDER ELIMINATION Goal: RH STG MANAGE BLADDER WITH ASSISTANCE Description: STG Manage Bladder With toileting Assistance Outcome: Not Progressing; bladder scan q 6;

## 2021-12-11 NOTE — Progress Notes (Signed)
Inpatient Rehabilitation Admission Medication Review by a Pharmacist  A complete drug regimen review was completed for this patient to identify any potential clinically significant medication issues.  High Risk Drug Classes Is patient taking? Indication by Medication  Antipsychotic Yes Compazine - N/V  Anticoagulant No   Antibiotic No   Opioid Yes Percocet - pain  Antiplatelet No   Hypoglycemics/insulin No   Vasoactive Medication No   Chemotherapy No   Other Yes Allopurinol - gout Zetia - HLD Protonix - GERD Remeron - sleep Namenda - dementia Synthroid - hypthyroidism Proscar - BPH Depakote - neuropathy Florinef - hypotension Synthroid - hypothyroidism     Type of Medication Issue Identified Description of Issue Recommendation(s)  Drug Interaction(s) (clinically significant)     Duplicate Therapy     Allergy     No Medication Administration End Date     Incorrect Dose     Additional Drug Therapy Needed     Significant med changes from prior encounter (inform family/care partners about these prior to discharge). nitroglycerin, vitamin D Resume while in CIR if appropriate/medically necessary  Other       Clinically significant medication issues were identified that warrant physician communication and completion of prescribed/recommended actions by midnight of the next day:  No   Pharmacist comments: n/a  Time spent performing this drug regimen review (minutes):  20   Thank you for allowing pharmacy to be a part of this patient's care.  Ardyth Harps, PharmD Clinical Pharmacist

## 2021-12-11 NOTE — Progress Notes (Signed)
Pharmacy Antibiotic Note  Patrick [name] is a 88 y.o. male admitted to CIR for [place] 2/2 recent falls now with concerns for UTI.  Pharmacy has been consulted for UTI antibiotic dosing. Recent urine culture resulted with staph haemolyticus 60k colonies/mL panS. Patient empirically started on cephalexin 7/28 PM. Discussed with CIR provider and recommended to continue cephalexin for total of 5 days duration  Plan: Cephalexin '500mg'$  Q12h x5 days Trend WBC, fever, renal function F/u cultures, clinical progress, levels as indicated De-escalate when able   Height: 6' (182.9 cm) Weight: 62.6 kg (137 lb 14.4 oz) IBW/kg (Calculated) : 77.6  Temp (24hrs), Avg:98.4 F (36.9 C), Min:98.1 F (36.7 C), Max:98.8 F (37.1 C)  Recent Labs  Lab 12/05/21 1425 12/05/21 1630 12/07/21 0335 12/10/21 0518  WBC 7.3 7.8 7.6 8.7  CREATININE 1.33* 1.22  --  1.17    Estimated Creatinine Clearance: 35.7 mL/min (by C-G formula based on SCr of 1.17 mg/dL).    Allergies  Allergen Reactions   Pravachol Other (See Comments)    Unknown   Triazolam Other (See Comments)    "Makes me crazy"    Thank you for allowing pharmacy to be a part of this patient's care.  Ardyth Harps, PharmD Clinical Pharmacist

## 2021-12-12 DIAGNOSIS — S066X9S Traumatic subarachnoid hemorrhage with loss of consciousness of unspecified duration, sequela: Secondary | ICD-10-CM | POA: Diagnosis not present

## 2021-12-12 LAB — CBC
HCT: 40.6 % (ref 39.0–52.0)
Hemoglobin: 13.6 g/dL (ref 13.0–17.0)
MCH: 32.5 pg (ref 26.0–34.0)
MCHC: 33.5 g/dL (ref 30.0–36.0)
MCV: 97.1 fL (ref 80.0–100.0)
Platelets: 87 10*3/uL — ABNORMAL LOW (ref 150–400)
RBC: 4.18 MIL/uL — ABNORMAL LOW (ref 4.22–5.81)
RDW: 13.4 % (ref 11.5–15.5)
WBC: 7.4 10*3/uL (ref 4.0–10.5)
nRBC: 0 % (ref 0.0–0.2)

## 2021-12-12 LAB — BASIC METABOLIC PANEL
Anion gap: 9 (ref 5–15)
BUN: 13 mg/dL (ref 8–23)
CO2: 26 mmol/L (ref 22–32)
Calcium: 8.7 mg/dL — ABNORMAL LOW (ref 8.9–10.3)
Chloride: 104 mmol/L (ref 98–111)
Creatinine, Ser: 1.07 mg/dL (ref 0.61–1.24)
GFR, Estimated: >60 mL/min (ref >60)
Glucose, Bld: 103 mg/dL — ABNORMAL HIGH (ref 70–99)
Potassium: 4 mmol/L (ref 3.5–5.1)
Sodium: 139 mmol/L (ref 135–145)

## 2021-12-12 NOTE — Progress Notes (Signed)
Inpatient Rehabilitation  Patient information reviewed and entered into eRehab system by Allien Melberg M. Neyland Pettengill, M.A., CCC/SLP, PPS Coordinator.  Information including medical coding, functional ability and quality indicators will be reviewed and updated through discharge.    

## 2021-12-12 NOTE — Progress Notes (Signed)
Patient ID: Patrick [name], male   DOB: 1926-12-04, 88 y.o.   MRN: [id number] Met with the patient and wife to review current status, rehab process, team conference and plan of care. Reviewed medications and dietary modification recommendations. Discussed keflex for UTI, Vit D for fatigue, monitoring PLTC level and pain is managed. No gout issues for year; requesting d/c allopurinol. Reg/ thin; encourage po fluids. Continue to follow along to discharge to address educational needs. ELOS 3 weeks; patient is hopeful that he will not need that long before ready for discharge. Reviewed team conference for Wednesday and discharge process. Margarito Liner

## 2021-12-12 NOTE — Progress Notes (Signed)
Inpatient Rehabilitation Care Coordinator Assessment and Plan Patient Details  Name: Patrick [name] MRN: [id number] Date of Birth: 1926/09/27  Today's Date: 12/12/2021  Hospital Problems: Principal Problem:   Subarachnoid hematoma with loss of consciousness, sequela [facility])  Past Medical History:  Past Medical History:  Diagnosis Date   Bilateral foot-drop 12/17/2019   BPH (benign prostatic hypertrophy)    Diverticulosis    ED (erectile dysfunction)    Food impaction of esophagus w/mild gastritis 06/28/2021   Gait abnormality 12/17/2019   GERD (gastroesophageal reflux disease)    Gout    H/O multiple concussions--as teenager    Hepatitis    hx hepatitis after mono as teenager   History of kidney stones    History of skin cancer    Hyperlipidemia    Hypertension    Hypothyroidism    IHD (ischemic heart disease)    Remote CABG in 1997   Memory disorder 12/17/2019   MI, acute, non ST segment elevation ([place]) 2010   s/p cath with occluded SVG to PD that fills by collaterals and the remainder of his revasculsarization is satisfactory. "the first time they did the stent , the second time they did the graft 8 vessels"   Nocturia    Schatzki's ring--mild    Thrombocytopenia ([place]) 11/19/2018   Tremor, essential 12/17/2019   Urinary frequency    Past Surgical History:  Past Surgical History:  Procedure Laterality Date   APPENDECTOMY     BALLOON DILATION N/A 03/25/2019   Procedure: BALLOON DILATION;  Surgeon: Otis Brace, MD;  Location: [facility];  Service: Gastroenterology;  Laterality: N/A;   CARDIAC CATHETERIZATION  09/08/2008   NORMAL. EF 60%; Occluded SVG to PD that fills by collaterals and the remainder of his revascularization is satisfactory. he is managed medically.    CORONARY ARTERY BYPASS GRAFT  1997   CABG x 8   CORONARY STENT INTERVENTION N/A 11/18/2018   Procedure: CORONARY STENT INTERVENTION;  Surgeon: Sherren Mocha, MD;  Location: [place] CV LAB;   Service: Cardiovascular;  Laterality: N/A;   CYSTOSCOPY WITH INSERTION OF UROLIFT N/A 03/19/2015   Procedure: CYSTOSCOPY WITH INSERTION OF FOUR UROLIFTS;  Surgeon: Carolan Clines, MD;  Location: [facility];  Service: Urology;  Laterality: N/A;   ESOPHAGOGASTRODUODENOSCOPY (EGD) WITH PROPOFOL N/A 02/12/2017   Procedure: ESOPHAGOGASTRODUODENOSCOPY (EGD) WITH PROPOFOL;  Surgeon: Otis Brace, MD;  Location: [facility];  Service: Gastroenterology;  Laterality: N/A;   ESOPHAGOGASTRODUODENOSCOPY (EGD) WITH PROPOFOL N/A 07/13/2018   Procedure: ESOPHAGOGASTRODUODENOSCOPY (EGD) WITH PROPOFOL;  Surgeon: Clarene Essex, MD;  Location: [facility];  Service: Endoscopy;  Laterality: N/A;   ESOPHAGOGASTRODUODENOSCOPY (EGD) WITH PROPOFOL N/A 03/25/2019   Procedure: ESOPHAGOGASTRODUODENOSCOPY (EGD) WITH PROPOFOL;  Surgeon: Otis Brace, MD;  [facility];  Service: Gastroenterology;  Laterality: N/A;   ESOPHAGOGASTRODUODENOSCOPY (EGD) WITH PROPOFOL N/A 06/28/2021   Procedure: ESOPHAGOGASTRODUODENOSCOPY (EGD) WITH PROPOFOL;  Surgeon: Wilford Corner, MD;  Location: [facility];  Service: Endoscopy;  Laterality: N/A;   FOREIGN BODY REMOVAL N/A 07/13/2018   Procedure: FOREIGN BODY REMOVAL;  Surgeon: Clarene Essex, MD;  Location[facility];  Service: Endoscopy;  Laterality: N/A;   IMPACTION REMOVAL  06/28/2021   Procedure: IMPACTION REMOVAL;  Surgeon: Wilford Corner, MD;  Location: [facility];  Service: Endoscopy;;   LEFT HEART CATH AND CORS/GRAFTS ANGIOGRAPHY N/A 11/18/2018   Procedure: LEFT HEART CATH AND CORS/GRAFTS ANGIOGRAPHY;  Surgeon: Sherren Mocha, MD;  Location: [place] [facility] LAB;  Service: Cardiovascular;  Laterality: N/A;   Social History:  reports that he  quit smoking about 63 years ago. His smoking use included cigarettes. He has a 15.00 pack-year smoking history. He has never used smokeless tobacco. He reports current alcohol use. He reports that he does not use drugs.  Family /  Support Systems Marital Status: Married Patient Roles: Spouse, Parent Spouse/Significant Other: Beverly [phone] Other Supports: Friends and church members Anticipated Caregiver: Education officer, museum Ability/Limitations of Caregiver: Can provide supervision-light min assist Caregiver Availability: 24/7 Family Dynamics: Close with family and rely upon one another. Both have been very independent up until recently and pt has been falling at home and been in and out of the hospital  Social History Preferred language: English Religion: Presbyterian Cultural Background: No issues Education: [place] - How often do you need to have someone help you when you read instructions, pamphlets, or other written material from your doctor or pharmacy?: Often Writes: Yes Employment Status: Retired Public relations account executive Issues: No issues Guardian/Conservator: None-according to MD pt is not fully capable of making his own decisions will look toward his wife to make any decisions while here   Abuse/Neglect Abuse/Neglect Assessment Can Be Completed: Yes Physical Abuse: Denies Verbal Abuse: Denies Sexual Abuse: Denies Exploitation of patient/patient's resources: Denies Self-Neglect: Denies  Patient response to: Social Isolation - How often do you feel lonely or isolated from those around you?: Never  Emotional Status Pt's affect, behavior and adjustment status: Pt is somewhat confused and obtained information from his wife. She reports he was doing well until he started falling and having some issues. He is very hard headed and wants to ambulate without a walker. Recent Psychosocial Issues: other health issues-dementia, stenosis, blind in right eye Psychiatric History: No history Substance Abuse History: No issues  Patient / Family Perceptions, Expectations & Goals Pt/Family understanding of illness & functional limitations: Pt is confused as to where he is and wife has been staying with him  at night. She does talk with the MD and feels her questions have been answered but it is important she be updated on his medical issues since he is not a good historian. Premorbid pt/family roles/activities: husband, father, retiree, church member, neighbor, Social research officer, government Anticipated changes in roles/activities/participation: resume Pt/family expectations/goals: Pt states: " Am I staying here?"  Wife states: " I hope he does well and regains his mobility I can assist but not lift him."  [employer]: None Premorbid Home Care/DME Agencies: Other (Comment) (Medi-home health-PT PTA) Transportation available at discharge: wife Is the patient able to respond to transportation needs?: Yes In the past 12 months, has lack of transportation kept you from medical appointments or from getting medications?: No In the past 12 months, has lack of transportation kept you from meetings, work, or from getting things needed for daily living?: No  Discharge Planning Living Arrangements: Spouse/significant other Support Systems: Spouse/significant other, Children, Friends/neighbors, Church/faith community Type of Residence: Private residence Insurance Resources: [employer], Multimedia programmer (specify) Nurse, mental health) Financial Resources: [place] Referred: No Living Expenses: Own Money Management: Spouse Does the patient have any problems obtaining your medications?: No Home Management: wife Patient/Family Preliminary Plans: Return home with wife who can assist with his care. She does need him to be mobile and not needing to be lifted at discharge. Aware of team conference on Wednesday and will have target DC date for them. Care Coordinator Anticipated Follow Up Needs: HH/OP  Clinical Impression Pleasantly confused gentleman who is willing to do therapies but asks how long and if he has to stay  here. His wife is very involved and has been staying here with him only leaving to  take the dogs out and feed them. Hopefully can reach goals of supervision level and will update on Wednesday after team conference.  Elease Hashimoto 12/12/2021, 2:10 PM

## 2021-12-12 NOTE — Progress Notes (Signed)
Occupational Therapy Session Note  Patient Details  Name: Patrick [name] MRN: [id number] Date of Birth: [birth year]  Today's Date: 12/12/2021 OT Individual Time: 1050-1205 OT Individual Time Calculation (min): 75 min    Short Term Goals: Week 1:  OT Short Term Goal 1 (Week 1): Pt will complete grooming tasks with CGA standing at the sink with use of LRAD. OT Short Term Goal 2 (Week 1): Pt will complete UB dressing with supervision while seated. OT Short Term Goal 3 (Week 1): Pt will complete LB dressing using AE and LRAD PRN. OT Short Term Goal 4 (Week 1): Pt will complete toilet transfer with CGA using LRAD. OT Short Term Goal 5 (Week 1): Pt will complete toileting tasks with CGA with use of LRAD PRN.  Skilled Therapeutic Interventions/Progress Updates:    Patient received awake and alert lying in bed, wife at bedside.  [name] waiting to talk with MD.   Patient agreeable to  getting up to take a shower.  Patient showered 2x/daily prior to this hospitalization.  Patient sat at edge of bed with min assist, and reported no dizziness.  Wife and PA indicated patient had rough day yesterday compared to his first day of rehab Saturday.  Patient's BP stable so transferred to wheelchair with min assist and to shower bench  - stand step using grab bar and min assist.   Patient able to shower with verbal cues and min assist for standing tasks.  Patient sat and stood with posterior bias.  Patient using both hands effectively to wash himself, yet had frequent drops of washcloth without awareness.   Patient transferred back to wheelchair and dressed himself with mod assist overall - LB, set up upper body.  Needed increased assistance for grooming.  Patient apraxic, and having difficulty initiating functional movement with tools - razor, toothbrush, comb.  Once started though he could participate.   Patient transferred back to bed with bed alarm set.  Social worker in to introduce herself.  Wife  home to care for dogs - anticipate return later today.  Patient asking how long he will be here, asking if he will sleep here, why he is here?  Became tearful when told he fell and had bleeding in his brain.  Reassured he will stay only long enough to get him strong to return home to his wife and dogs.     Therapy Documentation Precautions:  Precautions Precautions: Fall Precaution Comments: monitor BP (orthostatic hypotension), minimal impulsivity Restrictions Weight Bearing Restrictions: No   Pain: Pain Assessment Pain Scale: 0-10 Pain Score: 0-No pain Faces Pain Scale: No hurt     Therapy/Group: Individual Therapy  Mariah Milling 12/12/2021, 12:55 PM

## 2021-12-12 NOTE — Progress Notes (Signed)
Speech Language Pathology Daily Session Note  Patient Details  Name: [place] H Pitsenbarger MRN: [id number] Date of Birth: 1926-09-06  Today's Date: 12/12/2021 SLP Individual Time: 1300-1400 SLP Individual Time Calculation (min): 60 min  Short Term Goals: Week 1: SLP Short Term Goal 1 (Week 1): STG = LTG d/t short stay for ST  Skilled Therapeutic Interventions: Skilled ST treatment focused on speech and cognitive goals. Pt was received semi reclined in bed on arrival, agreeable to ST intervention. Spouse arrived halfway into session. Spouse reported speech is at baseline today, however seems to fluctuate with fatigue.   SLP provided education on speech intelligibility strategies via "SLOP" acronym (I.e., slow, loud, over-articulate, pause between words). Pt without evidence of dysarthria this date. Speech was perceived as clear and intelligible at the conversation level. Vocal quality was mildly hoarse. Spouse endorsed vocal quality has always been this way.   SLP provided education on strategies to maximize attention and memory in natural environment, including reducing background noise or environmental distractions, taking intermittent breaks, face-to-face communication for attention, and using large print visual aids for to support orientation. Spouse verbalized understanding through teach back.   SLP facilitated cognitive exercise in which pt generated names per each letter of the alphabet. Pt sustained attention to task for 8 minute duration with sup-to-min A verbal redirection to task 2/2 occasions external distractions in hallway. Pt benefited from verbal reminders for task objectives, as well as intermittently during session due to decreased short term memory (baseline deficits).   Considering pt is likely at/near baseline per spouse, plan to discharge from services during following additional education x1. Discussed plan with pt/spouse who verbalized agreement. Patient was left in bed with  alarm activated and immediate needs within reach at end of session. Continue per current plan of care.      Pain  None/Denied  Therapy/Group: Individual Therapy  Patty Sermons 12/12/2021, 1:26 PM

## 2021-12-12 NOTE — Progress Notes (Signed)
Physical Therapy Session Note  Patient Details  Name: Patrick [name] MRN: [id number] Date of Birth: January 25, 1927  Today's Date: 12/12/2021 PT Individual Time: 1430-1527 PT Individual Time Calculation (min): 57 min   Short Term Goals: Week 1:  PT Short Term Goal 1 (Week 1): Pt will perform bed mobility with CGA. PT Short Term Goal 2 (Week 1): Pt will completes bed to chair transfer consistently with CGA. PT Short Term Goal 3 (Week 1): Pt will ambulate x150' with CGA and LRAD. PT Short Term Goal 4 (Week 1): Pt will complete x2 steps with R hand rail and CGA  Skilled Therapeutic Interventions/Progress Updates:      Pt supine in bed to start - wife at bedside. Pt agreeable to therapy session with no reports of pain. He has abdominal binder and B thigh high ted's on throughout session. He denies any sx of hypotension or orthostasis during treatment.  Supine<>sitting EOB with supervision with hospital bed features. Fair sitting balance without LOB while donning slip-on shoes with totalA for time management. Sit<>stand to RW with minA and stand<>pivot transfer to w/c with minA and RW - decreased safety awareness while transferring by attempting to sit prior to fully being over surface.   Transported in w/c to main rehab gym for time and energy conservation.   Stand<>pivot with minA and no AD to mat table with similar deficits as above, very weak LE's. Worked on sit<>stand transitions 1x10 without RW and 1x10 with RW with CGA overall for steadying. Pt requiring verbal and tactile cueing for achieving full upright with emphasis on trunk/hip extension.   Gait training 37ft without AD + 65ft with RW. minA for both with significant unsteadiness and mid/moderate ataxia on R side, especially without AD. Tactile cueing for corrections and increasing awareness.   Balance training on firm level surfaces with feet shldr width apart while holding 2kg med ball - posterior bias with difficulty correcting and  facilitating anterior weight shift. Able to achieve with assist and cues. 2x10 forward reaching and 2x10 upward reaching with 2kg bed ball - minA overall for balance. Pt reporting "You wouldn't think something so simple, would be so difficult!"  Pt returned to room and assisted back to bed with minA stand<>pivot transfer using hospital bed rail. Bed mobility completed with supervision with HOB raised. All needs met. Bed alarm on, wife at bedside.    Therapy Documentation Precautions:  Precautions Precautions: Fall Precaution Comments: monitor BP (orthostatic hypotension), minimal impulsivity Restrictions Weight Bearing Restrictions: No General:    Therapy/Group: Individual Therapy  Alger Simons 12/12/2021, 2:28 PM

## 2021-12-12 NOTE — Progress Notes (Signed)
Inpatient [facility] Statement of Services  Patient Name:  Patrick [name]  Date:  12/12/2021  Welcome to the [facility].  Our goal is to provide you with an individualized program based on your diagnosis and situation, designed to meet your specific needs.  With this comprehensive rehabilitation program, you will be expected to participate in at least 3 hours of rehabilitation therapies Monday-Friday, with modified therapy programming on the weekends.  Your rehabilitation program will include the following services:  Physical Therapy (PT), Occupational Therapy (OT), Speech Therapy (ST), 24 hour per day rehabilitation nursing, Care Coordinator, Rehabilitation Medicine, Nutrition Services, and Pharmacy Services  Weekly team conferences will be held on wednesday to discuss your progress.  Your Inpatient Rehabilitation Care Coordinator will talk with you frequently to get your input and to update you on team discussions.  Team conferences with you and your family in attendance may also be held.  Expected length of stay: 10-14 days  Overall anticipated outcome: supervision with cues  Depending on your progress and recovery, your program may change. Your Inpatient Rehabilitation Care Coordinator will coordinate services and will keep you informed of any changes. Your Inpatient Rehabilitation Care Coordinator's name and contact numbers are listed  below.  The following services may also be recommended but are not provided by the [facility]:   [place] will be made to provide these services after discharge if needed.  Arrangements include referral to agencies that provide these services.  Your insurance has been verified to be:  [employer] Your primary doctor is:  Marton Redwood  Pertinent information will be shared with your doctor and your insurance  company.  Inpatient Rehabilitation Care Coordinator:  Ovidio Kin, [place] or Emilia Beck  Information discussed with and copy given to patient by: Elease Hashimoto, 12/12/2021, 12:48 PM

## 2021-12-12 NOTE — Progress Notes (Signed)
Pt's wife refused for pt to have his scheduled Percocet unless he has pain.

## 2021-12-12 NOTE — Progress Notes (Signed)
PROGRESS NOTE   Subjective/Complaints:  No further transient Right sided weakness , alert, joking at EOB  Wife at bedside   ROS: + fatigue, +increased sight sided weakness  Objective:   CT HEAD WO CONTRAST (5MM)  Result Date: 12/11/2021 CLINICAL DATA:  88 year old male with neurologic deficit. EXAM: CT HEAD WITHOUT CONTRAST TECHNIQUE: Contiguous axial images were obtained from the base of the skull through the vertex without intravenous contrast. RADIATION DOSE REDUCTION: This exam was performed according to the departmental dose-optimization program which includes automated exposure control, adjustment of the mA and/or kV according to patient size and/or use of iterative reconstruction technique. COMPARISON:  Head CT 12/05/2021.  CTA head and neck 11/09/2021. FINDINGS: Brain: Bilateral extra-axial fluid was isointense to CSF by MRI in June, but there is now a small hyperdense component of subdural hematoma on the right (series 3, image 18). But trace midline shift is toward the right side, unchanged from June. Small left superior frontal gyrus hemorrhagic focus has faded since 11/08/2021 with residual on series 3, image 24. And this corresponds to coronal image 34. No regional edema or mass effect. But there is also trace blood products now in the left subdural space such as series 6, image 37. Basilar cisterns remain normal, stable since June. Stable gray-white matter differentiation throughout the brain. No cortically based acute infarct identified. Vascular: Calcified atherosclerosis at the skull base. No suspicious intracranial vascular hyperdensity. Skull: No acute osseous abnormality identified. Sinuses/Orbits: Visualized paranasal sinuses and mastoids are stable and well aerated. Other: No acute orbit or scalp soft tissue finding. IMPRESSION: 1. Trace hemorrhage has developed within asymmetric bilateral Subdural Hygromas since June. But  underlying mild rightward midline shift is unchanged since last month. Consider continued noncontrast CT surveillance. 2. Small left superior frontal gyrus intra-axial hemorrhage has faded since June. 3. No acute or evolving cerebral infarct identified by CT. Electronically Signed   By: Genevie Ann M.D.   On: 12/11/2021 10:33   Recent Labs    12/10/21 0518 12/12/21 0519  WBC 8.7 7.4  HGB 13.3 13.6  HCT 39.8 40.6  PLT 89* 87*    Recent Labs    12/10/21 0518 12/12/21 0519  NA 139 139  K 3.8 4.0  CL 104 104  CO2 27 26  GLUCOSE 112* 103*  BUN 18 13  CREATININE 1.17 1.07  CALCIUM 8.6* 8.7*     Intake/Output Summary (Last 24 hours) at 12/12/2021 1050 Last data filed at 12/12/2021 0800 Gross per 24 hour  Intake 497 ml  Output 925 ml  Net -428 ml         Physical Exam: Vital Signs Blood pressure 131/76, pulse 94, temperature 98.3 F (36.8 C), resp. rate 19, height 6' (1.829 m), weight 64.9 kg, SpO2 98 %.  General: No acute distress Mood and affect are appropriate Heart: Regular rate and rhythm no rubs murmurs or extra sounds Lungs: Clear to auscultation, breathing unlabored, no rales or wheezes Abdomen: Positive bowel sounds, soft nontender to palpation, nondistended Extremities: No clubbing, cyanosis, or edema Skin: No evidence of breakdown, no evidence of rash Neurologic: Cranial nerves II through XII intact, motor strength is 5/5 in bilateral deltoid,  bicep, tricep, grip, hip flexor, knee extensors, ankle dorsiflexor and plantar flexor Sensory exam normal sensation to light touch and proprioception in bilateral upper and lower extremities Cerebellar exam normal finger to nose to finger as well as heel to shin in bilateral upper and lower extremities Musculoskeletal: Full range of motion in all 4 extremities. No joint swelling    Assessment/Plan: 1. Functional deficits which require 3+ hours per day of interdisciplinary therapy in a comprehensive inpatient rehab  setting. Physiatrist is providing close team supervision and 24 hour management of active medical problems listed below. Physiatrist and rehab team continue to assess barriers to discharge/monitor patient progress toward functional and medical goals  Care Tool:  Bathing    Body parts bathed by patient: Right arm, Left arm, Chest, Abdomen, Front perineal area, Right upper leg, Face, Left lower leg, Right lower leg, Left upper leg   Body parts bathed by helper: Buttocks     Bathing assist Assist Level: Minimal Assistance - Patient > 75%     Upper Body Dressing/Undressing Upper body dressing   What is the patient wearing?: Pull over shirt    Upper body assist Assist Level: Contact Guard/Touching assist    Lower Body Dressing/Undressing Lower body dressing      What is the patient wearing?: Incontinence brief     Lower body assist Assist for lower body dressing: Maximal Assistance - Patient 25 - 49%     Toileting Toileting    Toileting assist Assist for toileting: Moderate Assistance - Patient 50 - 74%     Transfers Chair/bed transfer  Transfers assist     Chair/bed transfer assist level: Moderate Assistance - Patient 50 - 74%     Locomotion Ambulation   Ambulation assist      Assist level: Minimal Assistance - Patient > 75% Assistive device: No Device Max distance: 150'   Walk 10 feet activity   Assist     Assist level: Minimal Assistance - Patient > 75% Assistive device: No Device   Walk 50 feet activity   Assist    Assist level: Minimal Assistance - Patient > 75% Assistive device: No Device    Walk 150 feet activity   Assist    Assist level: Minimal Assistance - Patient > 75% Assistive device: No Device    Walk 10 feet on uneven surface  activity   Assist     Assist level: Minimal Assistance - Patient > 75%     Wheelchair     Assist Is the patient using a wheelchair?: No             Wheelchair 50 feet with 2  turns activity    Assist            Wheelchair 150 feet activity     Assist          Blood pressure 131/76, pulse 94, temperature 98.3 F (36.8 C), resp. rate 19, height 6' (1.829 m), weight 64.9 kg, SpO2 98 %.  Medical Problem List and Plan: 1. Functional deficits secondary to [place]             -patient may shower             -ELOS/Goals: 3 weeks             Admit to CIR 2.  Antithrombotics: -DVT/anticoagulation:  Mechanical: Sequential compression devices, below knee Bilateral lower extremities             -antiplatelet therapy: N/A 3. Spinal  pain: On depakote for neuropathy (per chart review).             --Used oxycodone 10/325 mg 2-3 times a day.             -scheduling oxycodone at night 4. Mood/Behavior/Sleep: LCSW to follow for evaluation and support.              --Schedule oxycodone 5/325 mg/Hs for pain/sleep.              -antipsychotic agents: N/A 5. Neuropsych/cognition: This patient is not capable of making decisions on his own behalf. 6. Skin/Wound Care: Routine pressure relief measures.  7. Fluids/Electrolytes/Nutrition: Monitor I/O. Encourage fluid intake.  8. HTN w/significant orthostatic changes: Monitor BP TID w/orthostatic vitals             --orthostatic changes have been ongoing for years per records --Continue Proscar, change to bedtime. Change florinef (added 07/28) to 7 am prior to therapy. --Encourage fluid intake.  Binder/TEDs daily when out of bed.  9. CKD?: Will check CMET in am.  10. Mild ITP/Thrombocytopenia:  stable and mild     Latest Ref Rng & Units 12/12/2021    5:19 AM 12/10/2021    5:18 AM 12/07/2021    3:35 AM  CBC  WBC 4.0 - 10.5 K/uL 7.4  8.7  7.6   Hemoglobin 13.0 - 17.0 g/dL 13.6  13.3  13.7   Hematocrit 39.0 - 52.0 % 40.6  39.8  41.0   Platelets 150 - 400 K/uL 87  89  92                 --Monitor for signs of bleeding.  11. CAD: Monitor for symptoms with increase in activity. Has been on florinef in the past.   12.  H/o Food impaction/gastritis: Intake has been poor.  --Will order supervision for safety with meals/to assist w/meals.  13. Urgency/Dysuria: UC + for staph hemolytica- continue keflex for 5 day course 14. Fatigue: Vitamin D level 60, start 1,000U daily supplement.  15. Tachycardia: could be secondary to fludricortisone but this is helping BP, continue to monitor HR TID 16. Transient increased in right sided weakness: CT head obtained and negative for acute pathology, We discussed initial location of bleed , as well as subsequent CT findings     LOS: 3 days A FACE TO FACE EVALUATION WAS PERFORMED  Charlett Blake 12/12/2021, 10:50 AM

## 2021-12-12 NOTE — IPOC Note (Signed)
Overall Plan of Care [facility]) Patient Details Name: Patrick [name] MRN: [id number] DOB: 1927/01/25  Admitting Diagnosis: Subarachnoid hematoma with loss of consciousness, sequela [facility])  Hospital Problems: Principal Problem:   Subarachnoid hematoma with loss of consciousness, sequela ([place])     Functional Problem List: Nursing Bladder, Endurance, Pain, Medication Management, Safety, Bowel  PT Balance, Endurance, Motor, Perception, Safety  OT Balance, Perception, Safety, Cognition, Endurance, Motor  SLP Cognition  TR         Basic ADL's: OT Grooming, Bathing, Dressing, Toileting     Advanced  ADL's: OT Laundry, Simple Meal Preparation     Transfers: PT Bed Mobility, Car, Bed to Chair, Manufacturing systems engineer, Metallurgist: PT Ambulation, Stairs     Additional Impairments: OT Fuctional Use of Upper Extremity (minimal ataxia)  SLP        TR      Anticipated Outcomes Item Anticipated Outcome  Self Feeding modified independence  Swallowing      Basic self-care  supervision  Toileting  supervision   Bathroom Transfers supervision  Bowel/Bladder  manage bowel and bladder w mod I  Transfers  Supervision  Locomotion  Supervision  Communication  Supervision  Cognition  Min A attention  Pain  pain at or below level 4 w prns  Safety/Judgment  manage w cues   Therapy Plan: PT Intensity: Minimum of 1-2 x/day ,45 to 90 minutes PT Frequency: 5 out of 7 days PT Duration Estimated Length of Stay: 10-14 Days OT Intensity: Minimum of 1-2 x/day, 45 to 90 minutes OT Frequency: 5 out of 7 days OT Duration/Estimated Length of Stay: 10-14 days SLP Intensity: Minumum of 1-2 x/day, 30 to 90 minutes SLP Frequency: 1 to 3 out of 7 days SLP Duration/Estimated Length of Stay: 3 weeks - shorter duration for ST (1-2 txs)   Team Interventions: Nursing Interventions Bladder Management, Disease Management/Prevention, Medication Management, Discharge Planning, Pain  Management, Bowel Management, Patient/Family Education  PT interventions Ambulation/gait training, Community reintegration, DME/adaptive equipment instruction, Neuromuscular re-education, Psychosocial support, Stair training, UE/LE Strength taining/ROM, Training and development officer, Discharge planning, Functional electrical stimulation, Pain management, Skin care/wound management, UE/LE Coordination activities, Therapeutic Activities, Cognitive remediation/compensation, Disease management/prevention, Functional mobility training, Patient/family education, Splinting/orthotics, Therapeutic Exercise, Visual/perceptual remediation/compensation, Wheelchair propulsion/positioning  OT Interventions Training and development officer, Discharge planning, Self Care/advanced ADL retraining, Therapeutic Activities, UE/LE Coordination activities, Functional mobility training, Patient/family education, Therapeutic Exercise, Community reintegration, Engineer, drilling, Neuromuscular re-education, Psychosocial support, UE/LE Strength taining/ROM  SLP Interventions Cognitive remediation/compensation, Patient/family education, Therapeutic Activities, Functional tasks  TR Interventions    SW/CM Interventions     Barriers to Discharge MD  Medical stability  Nursing Decreased caregiver support, Home environment access/layout 2 level 2 ste main B+B; flight upstairs w wife  PT      OT      SLP      SW       Team Discharge Planning: Destination: PT-Home ,OT- Home , SLP-Home Projected Follow-up: PT-Home health PT, 24 hour supervision/assistance, OT-  Home health OT (potential for home eval), SLP-24 hour supervision/assistance Projected Equipment Needs: PT-To be determined, OT- To be determined, SLP-None recommended by SLP Equipment Details: PT- , OT-  Patient/family involved in discharge planning: PT- Patient, Family member/caregiver,  OT-Family member/caregiver, Patient, SLP-Patient, Family  member/caregiver  MD ELOS: 3 weeks.  Medical Rehab Prognosis:  Excellent Assessment: The patient has been admitted for CIR therapies with the diagnosis of ICH. The team will be addressing functional mobility, strength, stamina,  balance, safety, adaptive techniques and equipment, self-care, bowel and bladder mgt, patient and caregiver education. Goals have been set at MinA/S. Anticipated discharge destination is home.        See Team Conference Notes for weekly updates to the plan of care

## 2021-12-13 DIAGNOSIS — R300 Dysuria: Secondary | ICD-10-CM

## 2021-12-13 DIAGNOSIS — R000 Tachycardia, unspecified: Secondary | ICD-10-CM | POA: Diagnosis not present

## 2021-12-13 DIAGNOSIS — R7989 Other specified abnormal findings of blood chemistry: Secondary | ICD-10-CM

## 2021-12-13 DIAGNOSIS — K5903 Drug induced constipation: Secondary | ICD-10-CM

## 2021-12-13 DIAGNOSIS — S066X9S Traumatic subarachnoid hemorrhage with loss of consciousness of unspecified duration, sequela: Secondary | ICD-10-CM | POA: Diagnosis not present

## 2021-12-13 MED ORDER — MIRTAZAPINE 15 MG PO TABS
15.0000 mg | ORAL_TABLET | Freq: Every day | ORAL | Status: DC
  Administered 2021-12-13 – 2021-12-21 (×9): 15 mg via ORAL
  Filled 2021-12-13 (×9): qty 1

## 2021-12-13 MED ORDER — POLYETHYLENE GLYCOL 3350 17 G PO PACK
17.0000 g | PACK | Freq: Every day | ORAL | Status: DC
  Administered 2021-12-13 – 2021-12-20 (×8): 17 g via ORAL
  Filled 2021-12-13 (×8): qty 1

## 2021-12-13 NOTE — Progress Notes (Signed)
Resting most of shift w/o complaint or noted discomfort,wife at bedside.Orthostatic VS recorded

## 2021-12-13 NOTE — Progress Notes (Signed)
PROGRESS NOTE   Subjective/Complaints:  Pt alert in bed, wife at bedside.  LBM 7/29.  Reports some GU discomfort after IC yesterday.   ROS: + fatigue, +increased sight sided weakness, No CP or SOB  Objective:   CT HEAD WO CONTRAST (5MM)  Result Date: 12/11/2021 CLINICAL DATA:  88 year old male with neurologic deficit. EXAM: CT HEAD WITHOUT CONTRAST TECHNIQUE: Contiguous axial images were obtained from the base of the skull through the vertex without intravenous contrast. RADIATION DOSE REDUCTION: This exam was performed according to the departmental dose-optimization program which includes automated exposure control, adjustment of the mA and/or kV according to patient size and/or use of iterative reconstruction technique. COMPARISON:  Head CT 12/05/2021.  CTA head and neck 11/09/2021. FINDINGS: Brain: Bilateral extra-axial fluid was isointense to CSF by MRI in June, but there is now a small hyperdense component of subdural hematoma on the right (series 3, image 18). But trace midline shift is toward the right side, unchanged from June. Small left superior frontal gyrus hemorrhagic focus has faded since 11/08/2021 with residual on series 3, image 24. And this corresponds to coronal image 34. No regional edema or mass effect. But there is also trace blood products now in the left subdural space such as series 6, image 37. Basilar cisterns remain normal, stable since June. Stable gray-white matter differentiation throughout the brain. No cortically based acute infarct identified. Vascular: Calcified atherosclerosis at the skull base. No suspicious intracranial vascular hyperdensity. Skull: No acute osseous abnormality identified. Sinuses/Orbits: Visualized paranasal sinuses and mastoids are stable and well aerated. Other: No acute orbit or scalp soft tissue finding. IMPRESSION: 1. Trace hemorrhage has developed within asymmetric bilateral Subdural  Hygromas since June. But underlying mild rightward midline shift is unchanged since last month. Consider continued noncontrast CT surveillance. 2. Small left superior frontal gyrus intra-axial hemorrhage has faded since June. 3. No acute or evolving cerebral infarct identified by CT. Electronically Signed   By: Genevie Ann M.D.   On: 12/11/2021 10:33   Recent Labs    12/12/21 0519  WBC 7.4  HGB 13.6  HCT 40.6  PLT 87*    Recent Labs    12/12/21 0519  NA 139  K 4.0  CL 104  CO2 26  GLUCOSE 103*  BUN 13  CREATININE 1.07  CALCIUM 8.7*     Intake/Output Summary (Last 24 hours) at 12/13/2021 0750 Last data filed at 12/13/2021 0726 Gross per 24 hour  Intake 600 ml  Output 1578 ml  Net -978 ml         Physical Exam: Vital Signs Blood pressure (!) 169/73, pulse 81, temperature 98.7 F (37.1 C), temperature source Oral, resp. rate 16, height 6' (1.829 m), weight 64 kg, SpO2 98 %.  General: No acute distress Mood and affect are appropriate, pleasant Heart: Regular rate and rhythm no rubs murmurs or extra sounds Lungs: Clear to auscultation, breathing unlabored, no rales or wheezes Abdomen: Positive bowel sounds, soft nontender to palpation, nondistended Extremities: No clubbing, cyanosis, or edema Skin: No evidence of breakdown, no evidence of rash Neurologic: Cranial nerves II through XII intact, motor strength is 5/5 in bilateral deltoid, bicep, tricep, grip, hip  flexor, knee extensors, ankle dorsiflexor and plantar flexor Sensory exam normal sensation to light touch and proprioception in bilateral upper and lower extremities Cerebellar exam normal finger to nose to finger as well as heel to shin in bilateral upper and lower extremities Musculoskeletal: Full range of motion in all 4 extremities. No joint swelling    Assessment/Plan: 1. Functional deficits which require 3+ hours per day of interdisciplinary therapy in a comprehensive inpatient rehab setting. Physiatrist is  providing close team supervision and 24 hour management of active medical problems listed below. Physiatrist and rehab team continue to assess barriers to discharge/monitor patient progress toward functional and medical goals  Care Tool:  Bathing    Body parts bathed by patient: Right arm, Left arm, Chest, Abdomen, Front perineal area, Right upper leg, Face, Left lower leg, Right lower leg, Left upper leg, Buttocks   Body parts bathed by helper: Buttocks     Bathing assist Assist Level: Minimal Assistance - Patient > 75%     Upper Body Dressing/Undressing Upper body dressing   What is the patient wearing?: Pull over shirt    Upper body assist Assist Level: Supervision/Verbal cueing    Lower Body Dressing/Undressing Lower body dressing      What is the patient wearing?: Underwear/pull up, Pants     Lower body assist Assist for lower body dressing: Moderate Assistance - Patient 50 - 74%     Toileting Toileting Toileting Activity did not occur Landscape architect and hygiene only): N/A (no void or bm)  Toileting assist Assist for toileting: Independent with assistive device (urinal)     Transfers Chair/bed transfer  Transfers assist  Chair/bed transfer activity did not occur: Safety/medical concerns  Chair/bed transfer assist level: Minimal Assistance - Patient > 75%     Locomotion Ambulation   Ambulation assist      Assist level: Minimal Assistance - Patient > 75% Assistive device: No Device Max distance: 150'   Walk 10 feet activity   Assist     Assist level: Minimal Assistance - Patient > 75% Assistive device: No Device   Walk 50 feet activity   Assist    Assist level: Minimal Assistance - Patient > 75% Assistive device: No Device    Walk 150 feet activity   Assist    Assist level: Minimal Assistance - Patient > 75% Assistive device: No Device    Walk 10 feet on uneven surface  activity   Assist     Assist level: Minimal  Assistance - Patient > 75%     Wheelchair     Assist Is the patient using a wheelchair?: No             Wheelchair 50 feet with 2 turns activity    Assist            Wheelchair 150 feet activity     Assist          Blood pressure (!) 132/39, pulse 70, temperature 97.7 F (36.5 C), resp. rate 16, height 6' (1.829 m), weight 64 kg, SpO2 100 %.  Medical Problem List and Plan: 1. Functional deficits secondary to [place]             -patient may shower             -ELOS/Goals: 3 weeks             -Continue CIR 2.  Antithrombotics: -DVT/anticoagulation:  Mechanical: Sequential compression devices, below knee Bilateral lower extremities             -  antiplatelet therapy: N/A 3. Spinal pain: On depakote for neuropathy (per chart review).             --Used oxycodone 10/325 mg 2-3 times a day.             -scheduling oxycodone at night 4. Mood/Behavior/Sleep: LCSW to follow for evaluation and support.              --Schedule oxycodone 5/325 mg/Hs for pain/sleep.              -antipsychotic agents: N/A 5. Neuropsych/cognition: This patient is not capable of making decisions on his own behalf. 6. Skin/Wound Care: Routine pressure relief measures.  7. Fluids/Electrolytes/Nutrition: Monitor I/O. Encourage fluid intake.  8. HTN w/significant orthostatic changes: Monitor BP TID w/orthostatic vitals             --orthostatic changes have been ongoing for years per records --Continue Proscar, change to bedtime. Change florinef (added 07/28) to 7 am prior to therapy. --Encourage fluid intake.  Binder/TEDs daily when out of bed.  9. CKD?: Will check CMET in am.  -Scr improved to 1.07 7/31, continue to encourage fluid intake 10. Mild ITP/Thrombocytopenia:  stable and mild     Latest Ref Rng & Units 12/12/2021    5:19 AM 12/10/2021    5:18 AM 12/07/2021    3:35 AM  CBC  WBC 4.0 - 10.5 K/uL 7.4  8.7  7.6   Hemoglobin 13.0 - 17.0 g/dL 13.6  13.3  13.7   Hematocrit 39.0 -  52.0 % 40.6  39.8  41.0   Platelets 150 - 400 K/uL 87  89  92                 --Monitor for signs of bleeding.  11. CAD: Monitor for symptoms with increase in activity. Has been on florinef in the past.   12. H/o Food impaction/gastritis: Intake has been poor.  --Will order supervision for safety with meals/to assist w/meals.  13. Urgency/Dysuria: UC + for staph hemolytica- continue keflex for 5 day course-last dose 8/2 -Lidocaine Jelly and coude if IC needed 14. Fatigue: Vitamin D level 60, start 1,000U daily supplement.  15. Tachycardia: could be secondary to fludricortisone but this is helping BP, continue to monitor HR TID  -8/1 Improved 16. Transient increased in right sided weakness: CT head obtained and negative for acute pathology, We discussed initial location of bleed , as well as subsequent CT findings  17. Constipation  -8/1 Schedule miralax daily    LOS: 4 days A FACE TO FACE EVALUATION WAS PERFORMED  Jennye Boroughs 12/13/2021, 7:50 AM

## 2021-12-13 NOTE — Progress Notes (Signed)
Occupational Therapy Session Note  Patient Details  Name: Patrick [name] MRN: [id number] Date of Birth: [birth year]  Today's Date: 12/13/2021 OT Individual Time: 0935-1050 OT Individual Time Calculation (min): 75 min    Short Term Goals: Week 1:  OT Short Term Goal 1 (Week 1): Pt will complete grooming tasks with CGA standing at the sink with use of LRAD. OT Short Term Goal 2 (Week 1): Pt will complete UB dressing with supervision while seated. OT Short Term Goal 3 (Week 1): Pt will complete LB dressing using AE and LRAD PRN. OT Short Term Goal 4 (Week 1): Pt will complete toilet transfer with CGA using LRAD. OT Short Term Goal 5 (Week 1): Pt will complete toileting tasks with CGA with use of LRAD PRN.  Skilled Therapeutic Interventions/Progress Updates:    Patient received sidelying in bed.  Wife at bedside.  Provided orientation to patient regarding place, situation, time of day.  Patient agreeable to shower.  Patient able to sit at edge of bed without dizziness.  Wife followed as patient ambulated to bathroom with min assist, and facilitation for upright posture.  Patient able to wash body while seated on shower bench.  Patient needed intermittent cueing.  Patient with posterior bias in sitting and standing.  Patient with improved ability to dress lower body this session.  Patient frequently tearful throughout this session.  "I don't know why I am crying. Have you ever seen a 88 yr old guy crying?"   Patient with improved functional use of her RUE to shave himself and comb his hair this session.  Patient with frequent drops of tools in right hand with limited awareness.  Patient having difficulty seeing himself in mirror when pulled up to sink.  "I feel like it is a mile away."   Patient reassured that we were working to make him strong enough to go home.  Provided orientation again as patient's wife not in the room.  Patient left up in wheelchair with safety belt in place and engaged for  brief break before next session, table in front to prevent attempts to get up, and call bell in lap.   Therapy Documentation Precautions:  Precautions Precautions: Fall Precaution Comments: monitor BP (orthostatic hypotension), minimal impulsivity Restrictions Weight Bearing Restrictions: No  Pain: Pain Assessment Pain Scale: 0-10 Pain Score: 0-No pain Faces Pain Scale: No hurt     Therapy/Group: Individual Therapy  Mariah Milling 12/13/2021, 3:03 PM

## 2021-12-13 NOTE — Progress Notes (Signed)
Physical Therapy Session Note  Patient Details  Name: [place] H Cashaw MRN: [id number] Date of Birth: Feb 13, 1927  Today's Date: 12/13/2021 PT Individual Time: 3845-3646 + 1300-1345 PT Individual Time Calculation (min): 56 min  + 45 min  Short Term Goals: Week 1:  PT Short Term Goal 1 (Week 1): Pt will perform bed mobility with CGA. PT Short Term Goal 2 (Week 1): Pt will completes bed to chair transfer consistently with CGA. PT Short Term Goal 3 (Week 1): Pt will ambulate x150' with CGA and LRAD. PT Short Term Goal 4 (Week 1): Pt will complete x2 steps with R hand rail and CGA  Skilled Therapeutic Interventions/Progress Updates:      1st session: Pt sitting in w/c to start with wife entering session. Pt reports he recognizes therapist from yesterday but unable to recall name/role. Agreeable to therapy and denies pain. Transported in wheelchair to day room rehab gym.   Gait training using RW 148ft + 13ft with minA overall. Gait is quite unsteady with mild ataxia on R, B knee's flexed in stance, poor proximity to RW, trunk flexed. Speed somewhat uncontrolled with PT assisting with RW management and providing TC for upright trunk and hip extension.  Gait training with no AD to challenge balance and stepping strategies. Forward/backward + lateral stepping L<>R 2x60ft each direction. minA overall but pt very unsteady with multiple LOB that required correction, insufficient stepping strategy for balance recovery, arms in high guard.   Balance training while standing on compliant surface, blue air-ex foam pad. Eyes open CGA without UE support and eyes closed minA without UE support, able to sustain for ~15 second intervals prior to LOB. Challenged him with 3lb dowel rod completing forward reaching and shoulder press with minA overall for stability, emphasis on [name] and ankle strategies for balance.  NuStep completed x5 minutes with BLE only and x3.5 minutes with BLE/BUE. Completed  at L7 with cadence kept around ~20-30 steps/minute. Completed for cardiovascular endurance and general strengthening.   Returned to his room and he remained seated in w/c with safety belt alarm on, wife at bedside, all needs met. *Of note, pt becoming emotional during session. Reports he didn't know he had a stroke and was unaware of the situation, what caused it, his recovery prognosis, etc. Pt easily redirected but this occurred at least 4 times during treatment session. STM memory deficits are baseline.    2nd session: Pt supine in bed to start with wife at bedside. Pt agreeable to therapy without reports of pain. Supine<>sitting EOB with supervision with hospital bed features. Stand<>pivot transfer with minA and no AD with face-to-face from EOB to w/c. Transported to main rehab gym for time management.  1x10 sit<>stands from EOM to full standing with no AD and minA for pelvic facilitation for anterior tilt and verbal cueing for trunk extension.  Standing balance on firm surface with feet together and feet apart, minA overall with multiple LOB that required assist for correcting. BERG balance completed as noted below.  Patient demonstrates increased fall risk as noted by score of   7/56 on Berg Balance Scale.  (<36= high risk for falls, close to 100%; 37-45 significant >80%; 46-51 moderate >50%; 52-55 lower >25%)  Stair training completed using 6inch steps and 2 hand rails, step-to pattern while forward facing, minA overall for stability. Instructional cues for safety and for slowing down speed to improve balance which he responded well to.  Gait training ~172ft with minA and RW back towards  his room with cues for keeping body within walker frame, maintaining erect posture, increased R step height/length, and pacing activity.  Bed mobility completed at supervision level with HOB raised. Bed alarm on, all needs met.   Therapy Documentation Precautions:  Precautions Precautions:  Fall Precaution Comments: monitor BP (orthostatic hypotension), minimal impulsivity Restrictions Weight Bearing Restrictions: No General:   Balance: Balance Balance Assessed: Yes Standardized Balance Assessment Standardized Balance Assessment: Berg Balance Test Berg Balance Test Sit to Stand: Needs minimal aid to stand or to stabilize Standing Unsupported: Needs several tries to stand 30 seconds unsupported Sitting with Back Unsupported but Feet Supported on Floor or Stool: Able to sit safely and securely 2 minutes Stand to Sit: Needs assistance to sit Transfers: Needs one person to assist Standing Unsupported with Eyes Closed: Needs help to keep from falling Standing Ubsupported with Feet Together: Needs help to attain position and unable to hold for 15 seconds From Standing, Reach Forward with Outstretched Arm: Loses balance while trying/requires external support From Standing Position, Pick up Object from Floor: Unable to try/needs assist to keep balance From Standing Position, Turn to Look Behind Over each Shoulder: Needs assist to keep from losing balance and falling Turn 360 Degrees: Needs assistance while turning Standing Unsupported, Alternately Place Feet on Step/Stool: Needs assistance to keep from falling or unable to try Standing Unsupported, One Foot in Front: Loses balance while stepping or standing Standing on One Leg: Unable to try or needs assist to prevent fall Total Score: 7   Therapy/Group: Individual Therapy  Jakyren Fluegge P Tyeson Tanimoto 12/13/2021, 7:28 AM

## 2021-12-13 NOTE — Progress Notes (Addendum)
Speech Language Pathology Discharge Summary  Patient Details  Name: Patrick [name] MRN: [id number] Date of Birth: 08/31/1926  Today's Date: 12/13/2021 SLP Individual Time: 0820-0845 SLP Individual Time Calculation (min): 25 min   Skilled Therapeutic Interventions: Pt unable to maintain full alertness during today's session; therefore, sessions focused on providing skilled ST education provided to pt's wife re: dysarthria, impact of dysarthria on speech intelligibility, speech intelligibility strategies, energy conservation techniques, and environmental modifications that may be helpful in improving pt's comprehensibility to communication partner; pt's wife verbalized understanding via teach back. Additionally, mentioned use of magic cup vs pudding for additional calories and protein, which pt's wife expressed interest; messaged care coordinator and placed order in system. Unfortunately, pt missed 20 minutes of skilled ST intervention due to lethargy; pt's wife reports pt typically did not wake up until 1:00 prior to admission.   Patient has met 2 of 2 long term goals.  Patient to discharge at overall Supervision;Min level.  Reasons goals not met: N/A   Clinical Impression/Discharge Summary: Per pt and family reports, pt is currently at baseline level of functioning for deglutition, speech intelligibility, and cognitive-linguistic skills; baseline dementia per chart review. Pt and family education completed and handouts provided re: energy conservation and environmental modifications to improve speech comprehensibility and intelligibility. No formal ST f/u indicated at time of discharge. Will require 24/7 supervision and assistance at discharge, pt's wife verbalized understanding of all information, and all questions were answered.   Care Partner:  Caregiver Able to Provide Assistance: Yes  Type of Caregiver Assistance: Physical;Cognitive  Recommendation:  24 hour supervision/assistance   Rationale for SLP Follow Up:  (N/A)   Equipment: N/A   Reasons for discharge: Treatment goals met   Patient/Family Agrees with Progress Made and Goals Achieved: Yes    Javaya [place] A Hansford Hirt 12/13/2021, 1:13 PM

## 2021-12-13 NOTE — Plan of Care (Signed)
  Problem: RH Expression Communication Goal: LTG Patient will increase speech intelligibility (SLP) Description: LTG: Patient will increase speech intelligibility at word/phrase/conversation level with cues, % of the time (SLP) Outcome: Completed/Met   Problem: RH Attention Goal: LTG Patient will demonstrate this level of attention during functional activites (SLP) Description: LTG:  Patient will will demonstrate this level of attention during functional activites (SLP) Outcome: Completed/Met

## 2021-12-14 DIAGNOSIS — R000 Tachycardia, unspecified: Secondary | ICD-10-CM | POA: Diagnosis not present

## 2021-12-14 DIAGNOSIS — R3915 Urgency of urination: Secondary | ICD-10-CM

## 2021-12-14 DIAGNOSIS — K5903 Drug induced constipation: Secondary | ICD-10-CM | POA: Diagnosis not present

## 2021-12-14 DIAGNOSIS — G4700 Insomnia, unspecified: Secondary | ICD-10-CM

## 2021-12-14 DIAGNOSIS — E440 Moderate protein-calorie malnutrition: Secondary | ICD-10-CM | POA: Insufficient documentation

## 2021-12-14 DIAGNOSIS — R300 Dysuria: Secondary | ICD-10-CM | POA: Diagnosis not present

## 2021-12-14 DIAGNOSIS — S066X9S Traumatic subarachnoid hemorrhage with loss of consciousness of unspecified duration, sequela: Secondary | ICD-10-CM | POA: Diagnosis not present

## 2021-12-14 MED ORDER — ENSURE MAX PROTEIN PO LIQD
11.0000 [oz_av] | Freq: Every day | ORAL | Status: DC
  Administered 2021-12-14: 11 [oz_av] via ORAL

## 2021-12-14 MED ORDER — BUSPIRONE HCL 5 MG PO TABS
5.0000 mg | ORAL_TABLET | Freq: Two times a day (BID) | ORAL | Status: DC
  Administered 2021-12-14 – 2021-12-22 (×16): 5 mg via ORAL
  Filled 2021-12-14 (×17): qty 1

## 2021-12-14 MED ORDER — MELATONIN 3 MG PO TABS
3.0000 mg | ORAL_TABLET | Freq: Every day | ORAL | Status: DC
  Administered 2021-12-14 – 2021-12-16 (×3): 3 mg via ORAL
  Filled 2021-12-14 (×3): qty 1

## 2021-12-14 MED ORDER — SORBITOL 70 % SOLN
60.0000 mL | Freq: Every day | Status: DC | PRN
  Administered 2021-12-15 – 2021-12-17 (×2): 60 mL via ORAL
  Filled 2021-12-14 (×2): qty 60

## 2021-12-14 MED ORDER — PHENAZOPYRIDINE HCL 100 MG PO TABS
100.0000 mg | ORAL_TABLET | Freq: Three times a day (TID) | ORAL | Status: AC
Start: 2021-12-14 — End: 2021-12-16
  Administered 2021-12-14 – 2021-12-16 (×6): 100 mg via ORAL
  Filled 2021-12-14 (×6): qty 1

## 2021-12-14 NOTE — Consult Note (Signed)
   [facility] Inpatient Consult   12/14/2021  [place] H Wolfinger [birth year] [id number]  [place]  Accountable Care Organization [ACO] Patient: Medicare ACO REACH  *Referral: Readmission review  Primary Care Provider:  Ginger Organ., MD, [facility], is an embedded provider with a Chronic Care Management team and program, and is listed for the transition of care follow up and appointments.  Patient is currently admitted to [facility].  Plan: Review sent as requested  Please contact for further questions,  [name], RN BSN [facility] Liaison  [phone] business mobile phone Toll free office [phone]  Fax number: [phone] [email or web address]

## 2021-12-14 NOTE — Progress Notes (Signed)
Nutrition Follow-up  DOCUMENTATION CODES:   Non-severe (moderate) malnutrition in context of chronic illness  INTERVENTION:   -Magic cup TID with meals, each supplement provides 290 kcal and 9 grams of protein  -MVI with minerals daily -Ensure Max po daily, each supplement provides 150 kcal and 30 grams of protein  NUTRITION DIAGNOSIS:   Moderate Malnutrition related to chronic illness (ICH) as evidenced by mild fat depletion, moderate fat depletion, mild muscle depletion, moderate muscle depletion.  Ongoing  GOAL:   Patient will meet greater than or equal to 90% of their needs  Progressing   MONITOR:   PO intake, Supplement acceptance  REASON FOR ASSESSMENT:   Malnutrition Screening Tool    ASSESSMENT:   88 y.o. male admitted to CIR after SAH. PMH includes HTN, CKD IIIa, HLD, CAD, and recent ICH.  Reviewed I/O's: +145 ml x 24 hours and -981 ml since admission  UOP: 575 ml x 24 hours   Spoke with pt and wife at bedside. Pt reports good progress since CIR admission. His intake has improved, however, wife shares that she believes the structure of CIR is what is assisting him with eating. Per her report, pt generally consumes 2 meals per day (meat, starch and vegetable). He skips breakfast daily due to late wake-up time and increased time needed for ADLS.   Intake has improved since admission. Noted meal completions 50-100%. Pt does not like Boost or Ensure supplements, but is willing to try Magic Cups. Pt wife also considering Premier Protein for him use and is willing to trial Ensure Max. Pt wife concerned that pt will continue to skip breakfast when he returns home.  RD discussed ways to increase calories and protein in his diet. Pt reports liking to drink ice cream floats and would like to continue this at home.   Pt reports UBW is around 165# and endorses a 10# wt loss over the past month. Pt has experienced a 7.5% wt loss over the past month, which is significant for  time frame.   Medications reviewed and include vitamin D3, depakote, melatonin, remeron, and miralax.   Labs reviewed: CBGS: 124 (inpatient orders for glycemic control are none).    NUTRITION - FOCUSED PHYSICAL EXAM:  Flowsheet Row Most Recent Value  Orbital Region Moderate depletion  Upper Arm Region Mild depletion  Thoracic and Lumbar Region Mild depletion  Buccal Region Mild depletion  Temple Region Moderate depletion  Clavicle Bone Region Moderate depletion  Clavicle and Acromion Bone Region Moderate depletion  Scapular Bone Region Moderate depletion  Dorsal Hand Mild depletion  Patellar Region Moderate depletion  Anterior Thigh Region Moderate depletion  Posterior Calf Region Moderate depletion  Edema (RD Assessment) None  Hair Reviewed  Eyes Reviewed  Mouth Reviewed  Skin Reviewed  Nails Reviewed       Diet Order:   Diet Order             Diet regular Room service appropriate? Yes with Assist; Fluid consistency: Thin  Diet effective now                   EDUCATION NEEDS:   Education needs have been addressed  Skin:  Skin Assessment: Reviewed RN Assessment  Last BM:  12/10/21  Height:   Ht Readings from Last 1 Encounters:  12/09/21 6' (1.829 m)    Weight:   Wt Readings from Last 1 Encounters:  12/13/21 64 kg    Ideal Body Weight:  80.9 kg  BMI:  Body mass index is 19.14 kg/m.  Estimated Nutritional Needs:   Kcal:  1700-1900  Protein:  85-100 grams  Fluid:  >/= 1.7 L    Loistine Chance, RD, LDN, [place] Registered Dietitian II Certified Diabetes Care and Education Specialist Please refer to [facility] for RD and/or RD on-call/weekend/after hours pager

## 2021-12-14 NOTE — Progress Notes (Signed)
Occupational Therapy Session Note  Patient Details  Name: [place] H Abshier MRN: [id number] Date of Birth: 1926/07/31  Today's Date: 12/14/2021 OT Individual Time: 5366-4403 OT Individual Time Calculation (min): 71 min    Short Term Goals: Week 1:  OT Short Term Goal 1 (Week 1): Pt will complete grooming tasks with CGA standing at the sink with use of LRAD. OT Short Term Goal 2 (Week 1): Pt will complete UB dressing with supervision while seated. OT Short Term Goal 3 (Week 1): Pt will complete LB dressing using AE and LRAD PRN. OT Short Term Goal 4 (Week 1): Pt will complete toilet transfer with CGA using LRAD. OT Short Term Goal 5 (Week 1): Pt will complete toileting tasks with CGA with use of LRAD PRN.  Skilled Therapeutic Interventions/Progress Updates:    Patient receieved supine in bed, wife not present - patient calm.  Patient agreeable to get up out of bed to toilet, and shower.  Patient had continent void!  Patient walked to bathroom with walker and min assist.  Transferred onto shower bench wth min assist.  Complete all aspects of shower with cueing and supervision.  Patient able to shave 90% of his face with his right hand - although experirencing frequent drops of razor, deodorant cap, shaving cream.  Patient reminded frequently to focus on right hand during task - better able to orient tool as needed (change position of razor depending on curve of face.) Patient put back into bed with abdominal binder and thigh high teds under clothing.   Bed alarm set, and patient reoriented to call bell and TV functions.    Therapy Documentation Precautions:  Precautions Precautions: Fall Precaution Comments: monitor BP (orthostatic hypotension), minimal impulsivity Restrictions Weight Bearing Restrictions: No  Pain: Pain Assessment Pain Scale: 0-10 Pain Score: 0-No pain     Therapy/Group: Individual Therapy  Mariah Milling 12/14/2021, 12:56 PM

## 2021-12-14 NOTE — Patient Care Conference (Signed)
Inpatient RehabilitationTeam Conference and Plan of Care Update Date: 12/14/2021   Time: 11:17 AM    Patient Name: Patrick [name]      Medical Record Number: [id number]  Date of Birth: [birth year] Sex: Male         Room/Bed: 4W13C/4W13C-01 Payor Info: Payor: MEDICARE / Plan: MEDICARE PART A AND B / Product Type: *No Product type* /    Admit Date/Time:  12/09/2021  3:40 PM  Primary Diagnosis:  Subarachnoid hematoma with loss of consciousness, sequela [facility])  Hospital Problems: Principal Problem:   Subarachnoid hematoma with loss of consciousness, sequela ([place]) Active Problems:   Malnutrition of moderate degree    Expected Discharge Date: Expected Discharge Date: 12/21/21  Team Members Present: Physician leading conference: Dr. Jennye Boroughs Social Worker Present: Ovidio Kin, LCSW Nurse Present: Dorthula Nettles, RN PT Present: Ginnie Smart, PT OT Present: Other (comment) Criss Rosales, OT) SLP Present: Helaine Chess, SLP PPS Coordinator present : Gunnar Fusi, SLP     Current Status/Progress Goal Weekly Team Focus  Bowel/Bladder   Continent of B/B,I/O Cath for no voids currently on  Restore normal urinary status  Assess patient inability yo void, time toileting   Swallow/Nutrition/ Hydration             ADL's   Mod assist with BP Management (Teds/abdominal binder), min assist and frequent cueing bathing, grooming and dressing, providing consistent orientation, emotionally labile (worse when wife not present)  Supervision - with frequent verbal cueing  Orientation, stabilize mood, balance, functional mobility, improve functional use of dominant RUE, Improve activity tolerance   Mobility   Supervision bed mobility, minA for sit<>stand and stand<>pivot transfers. MinA gait with RW 183f, minA 12 steps using 2 hand rails. Horrible balance (Merrilee Jansky7/56), decreased insight and safety awareness, STM deficits limited carryover of interventions  Supervision (may need downgraded  to CGA/minA)  Balance, functional mobility, activity tolerance, DC planning, pt ed   Communication             Safety/Cognition/ Behavioral Observations            Pain   denies pain  Remain painfree  Assess pain QS and prn   Skin   Skin intact  maintain skin integrity  Assess skin QS and PRN     Discharge Planning:  HOme with wife who is staying here with pt due to his dementia. She is aware husband will need 24/7 supervision care at DC   Team Discussion: No complaints today. Wife will not be here tonight for patient. Decreased bladder scans and PVR's. Miralax added. PRN Sorbitol. History of Dementia. Urinary urgency. Discontinued Flomax. Oxy PRN for spinal pain. Poor intake. Orthostatic blood pressure, Ted's, and abdominal binder. Discharging home with spouse. Right side weakness, lack of attention. Nutrition to provide [employer] with meal trays.   Patient on target to meet rehab goals: yes, OT goals supervision with max cues, PT goals supervision, may need to downgrade to CGA. Currently min assist. Poor balance, bed mobility good. 150 ft min assist with RW, 12 steps min assist. Has decreased insight. Cues to slow down. [place]  *See Care Plan and progress notes for long and short-term goals.   Revisions to Treatment Plan:  Adjusting medications, therapy goals possible downgrade.   Teaching Needs: Family education, medication/pain management, bowel/bladder management, transfer/gait training, safety awareness, etc.   Current Barriers to Discharge: Decreased caregiver support, Home enviroment access/layout, Incontinence, Lack of/limited family support, and Nutritional means  Possible Resolutions  to Barriers: Family education HH therapy Order recommended DME Time toilet Provide supplements for nutrition     Medical Summary Current Status: Urgency urinary, Constipation, tachycardia, ICH, dementia  Barriers to Discharge: Home enviroment access/layout;Medical  stability;Incontinence  Barriers to Discharge Comments: Urgency urinary, Constipation, tachycardia, ICH, dementia Possible Resolutions to Barriers/Weekly Focus: monitor PVR, laxatives started, monitor HR   Continued Need for Acute Rehabilitation Level of Care: The patient requires daily medical management by a physician with specialized training in physical medicine and rehabilitation for the following reasons: Direction of a multidisciplinary physical rehabilitation program to maximize functional independence : Yes Medical management of patient stability for increased activity during participation in an intensive rehabilitation regime.: Yes Analysis of laboratory values and/or radiology reports with any subsequent need for medication adjustment and/or medical intervention. : Yes   I attest that I was present, lead the team conference, and concur with the assessment and plan of the team.   Cristi Loron 12/14/2021, 6:32 PM

## 2021-12-14 NOTE — Progress Notes (Signed)
Patient ID: Patrick [name], male   DOB: 01-26-1927, 88 y.o.   MRN: [id number]  Met with pt and wife who is present pt is quite upset and wife wants Pam-PA or MD to prescribe him something for his nerves. Have reached out to both PA and MD to ask them to address her concern. Also informed wife and pt of team conference goals of CGA level with cues and discharge date of 8/9. He already has a rolling walker but does need a wheelchair and wife wants a hospital bed for a short time. He was active with [facility] will resume services. Have set up education for Monday from 1:00-3:00. Will work toward discharge next Wednesday

## 2021-12-14 NOTE — Progress Notes (Signed)
Physical Therapy Session Note  Patient Details  Name: Patrick [name] MRN: [id number] Date of Birth: Jan 15, 1927  Today's Date: 12/14/2021 PT Individual Time: 1134-1200 + 6979-4801 PT Individual Time Calculation (min): 26 min  + 72 min  Short Term Goals: Week 1:  PT Short Term Goal 1 (Week 1): Pt will perform bed mobility with CGA. PT Short Term Goal 2 (Week 1): Pt will completes bed to chair transfer consistently with CGA. PT Short Term Goal 3 (Week 1): Pt will ambulate x150' with CGA and LRAD. PT Short Term Goal 4 (Week 1): Pt will complete x2 steps with R hand rail and CGA  Skilled Therapeutic Interventions/Progress Updates:       1st session: Pt lying in bed to start - agreeable to therapy session. Denies pain but reports a little fatigue. Supine<>sitting EOB with supervision. Able to don shoes with minA for sliding heel's in. Stand<>pivot transfer with minA to w/c, towards her L side. Transported to day room rehab gym for time management.  Sit<>stand to RW from w/c with CGA with cues for split hand placement as pt tends to pull from RW to rise. Ambulating 160f around nurses station with CGA/minA and RW - cues for slowing down speed, upright posture, and keeping body within walker frame - increased unsteadiness with turns with RW tilting on 1 wheel at times. Cues throughout for safety and management.  Standing there-ex using high/low table with CGA for safety -standing heel raises -standing hip abduction, bilaterally -standing hamstring curls, bilaterally *1x15 reps with brief standing rest breaks.  Standing balance on level firm ground with hands hovering over table for safety and added confidence to reduce fear of falling. -feet apart eyes open 60 seconds without LOB -feet apart eyes closed 10 seconds without LOB *Completed several repetitions of this with verbal cues for ankle/hip strategies  Returned to room and remained seated in w/c at end of session. Pt becoming tearful  when his wife entered the room, unclear why and pt unable to explain. Left in the hands of his wife with pt sitting in w/c, safety belt alarm on, all needs met.  2nd session: Pt sitting in w/c to start with wife at bedside. Pt agreeable to therapy and denies pain. Trans[facility] outside [facility] w/c [facility], fresh air, and to improve affect. While outdoors, spent time educating him on DC plan, home safety, and general fall prevention - limited carryover due to baseline STM deficits. Also worked on gPersonnel officeroutdoors on uMicrosoft bChartered loss adjusterwith CGA/minA and RW - cues needed for keeping body within walker frame, upright posture, and pacing activity. Practiced transfers to park benches and seats without arm rests - minA overall for turning to sit with pt having decreased awareness of body position in relation to surface. Returned upstairs to [facility]floor and worked the remaining time on standing balance. Standing on blue air-ex foam pad using high/low table for intermittent UE support - CGA/minA overall for standing balance. Completed therapeutic activities in standing including Connect-4 game and Scrabble (modified). Pt unable to recall directions of Connect-4 game but was able to use RUE to reach and grasp small chips without difficulty. Pt able to build words in scrabble game without cues and no visual deficits noted during these activities. Ambulated back to his room, ~72fwith CGA/minA and RW - difficulty with approaching bed using RW to safely sit - trying to abandon the RW or sit sideways. Able to reposition self on EOB  without assist and bed mobility completed with supervision. All needs met, wife at bedside who was updated on pt's mobility and tolerance during therapy.     Therapy Documentation Precautions:  Precautions Precautions: Fall Precaution Comments: monitor BP (orthostatic hypotension), minimal impulsivity Restrictions Weight Bearing Restrictions: No General:    Vital Signs:  Pain:   Mobility:   Locomotion :    Trunk/Postural Assessment :    Balance:   Exercises:   Other Treatments:      Therapy/Group: Individual Therapy  Alger Simons 12/14/2021, 7:34 AM

## 2021-12-14 NOTE — Progress Notes (Signed)
Physical Therapy Session Note  Patient Details  Name: [place] H Haggard MRN: [id number] Date of Birth: [birth year]  Today's Date: 12/14/2021 PT Individual Time: 4496-7591 PT Individual Time Calculation (min): 43 min   Short Term Goals: Week 1:  PT Short Term Goal 1 (Week 1): Pt will perform bed mobility with CGA. PT Short Term Goal 2 (Week 1): Pt will completes bed to chair transfer consistently with CGA. PT Short Term Goal 3 (Week 1): Pt will ambulate x150' with CGA and LRAD. PT Short Term Goal 4 (Week 1): Pt will complete x2 steps with R hand rail and CGA  Skilled Therapeutic Interventions/Progress Updates:     Pt received supine in bed and agrees to therapy. No complaint of pain. Supine to sit with bed features and cues for sequencing and positioning. Pt verbalizes need to urinate. Pt performs sit to stand with minA and cues for initiation and body mechanics. Pt ambulates to toilet with modA at trunk and hips due to postural sway and LOBs in multiple directions. Pt stands from toilet without warning and requires education on safety. When pulling up pants pt has complete LOB forward, requiring modA for safety. PT cues pt to utilize upright gaze to improve posture and balance. Pt ambulates to sink with modA and washes hands. Pt ambulates to dayroom, x100', with modA at trunk with cues for increased stride length to decrease risk for falls. Pt completes Nustep for strength and endurance training as well as reciprocal coordination. Pt completes x1:00 at workload of 6 with average steps per minute ~65. PT cues pt use slow step rate to work on endurance and following rest break, pt completes 3x5:00 at workload of 5 with average steps per minute ~50. Pt performs sit to stand from Nustep with modA. Pt attempts to reach out for multiple objects for stability, with decreased safety awareness and requiring cueing for safety. Pt ambulates back to room, x120', with modA and cues for posture as well as increasing  stride length to decrease risk for falls. Cues for sequencing and positioning for safe transfer back to bed. Mina for sit to supine. Pt BP assessed in supine 160/70 with HR 98 bpm. Pt left with alarm intact and all needs within reach.  Therapy Documentation Precautions:  Precautions Precautions: Fall Precaution Comments: monitor BP (orthostatic hypotension), minimal impulsivity Restrictions Weight Bearing Restrictions: No  Therapy/Group: Individual Therapy  Breck Coons, PT, DPT 12/14/2021, 5:15 PM

## 2021-12-14 NOTE — Progress Notes (Addendum)
PROGRESS NOTE   Subjective/Complaints:  Pt in bed this AM with wife at his bedside.  LBM 7/29.  He had GU discomfort after he had IC last night. Wife says she will not be in his room tonight. He slept better yesterday.   ROS: + fatigue, +increased sight sided weakness, No CP or SOB, + chronic back pain  Objective:   No results found. Recent Labs    12/12/21 0519  WBC 7.4  HGB 13.6  HCT 40.6  PLT 87*    Recent Labs    12/12/21 0519  NA 139  K 4.0  CL 104  CO2 26  GLUCOSE 103*  BUN 13  CREATININE 1.07  CALCIUM 8.7*     Intake/Output Summary (Last 24 hours) at 12/14/2021 0958 Last data filed at 12/14/2021 0915 Gross per 24 hour  Intake 720 ml  Output 575 ml  Net 145 ml         Physical Exam: Vital Signs Blood pressure (!) 145/67, pulse 79, temperature (!) 97.3 F (36.3 C), resp. rate 19, height 6' (1.829 m), weight 64 kg, SpO2 99 %.  General: No acute distress Mood and affect are appropriate, pleasant Heart: Regular rate and rhythm no rubs murmurs or extra sounds Lungs: CTAB breathing unlabored, no rales or wheezes Abdomen: Positive bowel sounds, soft nontender to palpation, nondistended Extremities: No clubbing, cyanosis, or edema Skin: Warm, dry, No evidence of breakdown, no evidence of rash Neurologic: Cranial nerves II through XII intact, motor strength is 5/5 in bilateral deltoid, bicep, tricep, grip, hip flexor, knee extensors, ankle dorsiflexor and plantar flexor Sensory exam normal sensation to light touch and proprioception in bilateral upper and lower extremities Cerebellar exam normal finger to nose to finger as well as heel to shin in bilateral upper and lower extremities Musculoskeletal: Full range of motion in all 4 extremities. No joint swelling    Assessment/Plan: 1. Functional deficits which require 3+ hours per day of interdisciplinary therapy in a comprehensive inpatient rehab  setting. Physiatrist is providing close team supervision and 24 hour management of active medical problems listed below. Physiatrist and rehab team continue to assess barriers to discharge/monitor patient progress toward functional and medical goals  Care Tool:  Bathing    Body parts bathed by patient: Right arm, Left arm, Chest, Abdomen, Front perineal area, Right upper leg, Face, Left lower leg, Right lower leg, Left upper leg, Buttocks   Body parts bathed by helper: Buttocks     Bathing assist Assist Level: Minimal Assistance - Patient > 75%     Upper Body Dressing/Undressing Upper body dressing   What is the patient wearing?: Pull over shirt    Upper body assist Assist Level: Supervision/Verbal cueing    Lower Body Dressing/Undressing Lower body dressing      What is the patient wearing?: Underwear/pull up, Pants     Lower body assist Assist for lower body dressing: Minimal Assistance - Patient > 75%     Toileting Toileting Toileting Activity did not occur (Clothing management and hygiene only): N/A (no void or bm)  Toileting assist Assist for toileting: Independent with assistive device (urinal)     Transfers Chair/bed transfer  Transfers assist  Chair/bed transfer activity did not occur: Safety/medical concerns  Chair/bed transfer assist level: Minimal Assistance - Patient > 75%     Locomotion Ambulation   Ambulation assist      Assist level: Minimal Assistance - Patient > 75% Assistive device: No Device Max distance: 150'   Walk 10 feet activity   Assist     Assist level: Minimal Assistance - Patient > 75% Assistive device: No Device   Walk 50 feet activity   Assist    Assist level: Minimal Assistance - Patient > 75% Assistive device: No Device    Walk 150 feet activity   Assist    Assist level: Minimal Assistance - Patient > 75% Assistive device: No Device    Walk 10 feet on uneven surface  activity   Assist      Assist level: Minimal Assistance - Patient > 75%     Wheelchair     Assist Is the patient using a wheelchair?: No             Wheelchair 50 feet with 2 turns activity    Assist            Wheelchair 150 feet activity     Assist          Blood pressure (!) 145/67, pulse 79, temperature (!) 97.3 F (36.3 C), resp. rate 19, height 6' (1.829 m), weight 64 kg, SpO2 99 %.  Medical Problem List and Plan: 1. Functional deficits secondary to traumatic ICH             -patient may shower             -ELOS/Goals: 3 weeks             -Continue CIR  -Team conference today 2.  Antithrombotics: -DVT/anticoagulation:  Mechanical: Sequential compression devices, below knee Bilateral lower extremities             -antiplatelet therapy: N/A 3. Spinal pain: On depakote for neuropathy (per chart review).             --Used oxycodone 10/325 mg 2-3 times a day.             -scheduling oxycodone at night 4. Mood/Behavior/Sleep: LCSW to follow for evaluation and support.              --Schedule oxycodone 5/325 mg/Hs for pain/sleep.              -antipsychotic agents: N/A  -Melatonin '3mg'$  started 8/2 for sleep 5. Neuropsych/cognition: This patient is not capable of making decisions on his own behalf. 6. Skin/Wound Care: Routine pressure relief measures.  7. Fluids/Electrolytes/Nutrition: Monitor I/O. Encourage fluid intake.  8. HTN w/significant orthostatic changes: Monitor BP TID w/orthostatic vitals             --orthostatic changes have been ongoing for years per records --Continue Proscar, change to bedtime. Change florinef (added 07/28) to 7 am prior to therapy. --Encourage fluid intake.  Binder/TEDs daily when out of bed.  -8/1 he continues to have orthostatic hypotension, however improved overall from prior 9. CKD?: Will check CMET in am.  -Scr improved to 1.07 7/31, continue to encourage fluid intake 10. Mild ITP/Thrombocytopenia:  stable and mild     Latest  Ref Rng & Units 12/12/2021    5:19 AM 12/10/2021    5:18 AM 12/07/2021    3:35 AM  CBC  WBC 4.0 - 10.5 K/uL 7.4  8.7  7.6  Hemoglobin 13.0 - 17.0 g/dL 13.6  13.3  13.7   Hematocrit 39.0 - 52.0 % 40.6  39.8  41.0   Platelets 150 - 400 K/uL 87  89  92                 --Monitor for signs of bleeding.  11. CAD: Monitor for symptoms with increase in activity. Has been on florinef in the past.   12. H/o Food impaction/gastritis: Intake has been poor.  --Will order supervision for safety with meals/to assist w/meals.  13. Urgency/Dysuria: UC + for staph hemolytica- continue keflex for 5 day course-last dose 8/2 -Lidocaine Jelly and coude if IC needed -Will decrease frequency of PVR -On proscar at bedtime, medication options limited by orthostatic hypotension 14. Fatigue: Vitamin D level 60, start 1,000U daily supplement.  15. Tachycardia: could be secondary to fludricortisone but this is helping BP, continue to monitor HR TID  -8/2 continues to be improved 16. Transient increased in right sided weakness: CT head obtained and negative for acute pathology, We discussed initial location of bleed , as well as subsequent CT findings  17. Constipation  -8/1 Schedule miralax daily  -Sorbitol PRN added    LOS: 5 days A FACE TO FACE EVALUATION WAS PERFORMED  Jennye Boroughs 12/14/2021, 9:58 AM

## 2021-12-15 DIAGNOSIS — E440 Moderate protein-calorie malnutrition: Secondary | ICD-10-CM

## 2021-12-15 DIAGNOSIS — F411 Generalized anxiety disorder: Secondary | ICD-10-CM

## 2021-12-15 DIAGNOSIS — R000 Tachycardia, unspecified: Secondary | ICD-10-CM | POA: Diagnosis not present

## 2021-12-15 DIAGNOSIS — S066X9S Traumatic subarachnoid hemorrhage with loss of consciousness of unspecified duration, sequela: Secondary | ICD-10-CM | POA: Diagnosis not present

## 2021-12-15 DIAGNOSIS — K5903 Drug induced constipation: Secondary | ICD-10-CM | POA: Diagnosis not present

## 2021-12-15 DIAGNOSIS — R3915 Urgency of urination: Secondary | ICD-10-CM | POA: Diagnosis not present

## 2021-12-15 NOTE — Progress Notes (Signed)
Physical Therapy Session Note  Patient Details  Name: Patrick [name] MRN: [id number] Date of Birth: 12-Oct-1926  Today's Date: 12/15/2021 PT Individual Time: 1000-1045 + 1300-1315 PT Individual Time Calculation (min): 45 min  + 15 min  Short Term Goals: Week 1:  PT Short Term Goal 1 (Week 1): Pt will perform bed mobility with CGA. PT Short Term Goal 2 (Week 1): Pt will completes bed to chair transfer consistently with CGA. PT Short Term Goal 3 (Week 1): Pt will ambulate x150' with CGA and LRAD. PT Short Term Goal 4 (Week 1): Pt will complete x2 steps with R hand rail and CGA  Skilled Therapeutic Interventions/Progress Updates:      1st session: Pt supine in bed to start. Wife at bedside, pt agreeable to therapy without reports of pain. Pt without abdominal binder of thigh-hgih compression socks on. Supine<>sitting EOB with supervision. Donned compresion socks and shoes with dependant assist. BP assessed in sitting and standing to r/o orthostasis and pt not wanting abdominal binder  BP sitting: 144/58 BP standing: 118/58 BP standing 2 min: 131/60 *Pt asymptomatic. Left abdominal binder off throughout session and pt without c/o dizziness/lightheadedness.  Transported in w/c to main rehab gym for time management and energy conservation. Stand<>pivot with minA and no AD to mat table.   1x10 sit<>stands from low mat table with minA overall - emphasis on LE placement, forward weight shift, and powering through legs to stand.   Worked on his standing balance feet apart/feet together with eyes open/closed - CGA for all with improved righting reaction and hip/ankle strategy to facilitate forward weight shifting.   Standing ball toss to rehab tech with PT guarding due to posterior lean - challenging righting reactions, [name], hand/eye coordination, and perturbations in standing. minA overall requiring for safety  Gait 129ft back to room with CGA/minA and RW, required cueing for  keeping body within walker frame, increasing R step height/length, and maintaining upright posture.   Concluded session seated in w/c with all needs met, wife at bedside. Informed of upcoming therapy schedule.  2nd session: Pt sitting in w/c to start with wife at bedside. Wife reports that patient is more fatigued than typical, contributes to melatonin that was taken last night. Wife and patient requesting to rest due to fatigue. BP taken while seated - WNL 151/59. Pt assisted to bed via squat<>pivot minA transfer. Bed mobility completed supervision with HOB flat. Pt ended session supine with bed alarm on, all needs met.    Therapy Documentation Precautions:  Precautions Precautions: Fall Precaution Comments: monitor BP (orthostatic hypotension), minimal impulsivity Restrictions Weight Bearing Restrictions: No General:     Therapy/Group: Individual Therapy  Alger Simons 12/15/2021, 7:36 AM

## 2021-12-15 NOTE — Progress Notes (Signed)
Occupational Therapy Session Note  Patient Details  Name: Patrick [name] MRN: [id number] Date of Birth: 09/01/1926  Today's Date: 12/15/2021 OT Individual Time: 1121-1216  OT Individual Time Calculation (min): 55 min   Short Term Goals: Week 1:  OT Short Term Goal 1 (Week 1): Pt will complete grooming tasks with CGA standing at the sink with use of LRAD. OT Short Term Goal 2 (Week 1): Pt will complete UB dressing with supervision while seated. OT Short Term Goal 3 (Week 1): Pt will complete LB dressing using AE and LRAD PRN. OT Short Term Goal 4 (Week 1): Pt will complete toilet transfer with CGA using LRAD. OT Short Term Goal 5 (Week 1): Pt will complete toileting tasks with CGA with use of LRAD PRN.  First Session (11:21 - 12:16) - Skilled Therapeutic Interventions/Progress Updates:  Pt awake in w/c with spouse present upon OT arrival to the room. [name] reports, "He's pretty emotional today." Pt in agreement for OT session. Pt demo's increased fatigue today than previous sessions with this OT. Pt does report fatigue and feeling "sleepy". Pt would frequently close eyes during a seated rest break. However, participated well with tasks during session.     Therapy Documentation Precautions:  Precautions Precautions: Fall Precaution Comments: monitor BP (orthostatic hypotension), minimal impulsivity Restrictions Weight Bearing Restrictions: No Vital Signs: Please see "Flowsheet" for most recent vitals charted by nursing staff.  Pain: Pain Assessment Pain Scale: 0-10 Pain Score: 0-No pain  ADL: Pt reports no need to perform ADL's at this time due to completing earlier in the day  Therapeutic Activity: Pt participates in therapeutic activity in order to improve functional ambulation with environmental visual scanning. These skills are needed to improve safety with functional ambulation within the home. Pt able to perform functional ambulation within the hallway ~100' with use  of FWW and minimal - CGA for safety. Pt requires moderate VC's for proper foot clearance of the R foot and for safe walker management. Pt tends to push walker out in front with ambulation. Pt encouraged to perform visual scanning to locate purple sticky notes in the hallway. Pt requires minimal VC's to visually scan on B sides as pt tends to look more to R than L. Pt does demo minimal impulsivity by taking R hand off walker to point at sticky note or to grab one off the floor. Pt requires cueing and education on how this increases fall risks and was encouraged to visually fixate and verbalize when pt locates a sticky note.  Pt then participates in standing activity with fine motor coordination and midline crossing task in order to improve dynamic standing balance with motor control on RUE. Pt able to stand at the table with CGA to then grasp blue foam blocks with R hand to place in cup on the opposite side from R <> L. Pt demo's minimal ataxia in RUE compared to movements in LUE during task. Pt able to transfer ~10 blocks x 2 trials with no LOB while standing.    Pt requested to stay in the w/c at end of session. Pt left sitting comfortably in the w/c with personal belongings and call light within reach, belt alarm placed and activated, and comfort needs attended to.   Therapy/Group: Individual Therapy  [name] 12/15/2021, 11:30 AM

## 2021-12-15 NOTE — Progress Notes (Signed)
Occupational Therapy Session Note  Patient Details  Name: Patrick [name] MRN: [id number] Date of Birth: 04-24-1927  Today's Date: 12/15/2021 OT Missed Time: 75 Minutes Missed Time Reason: Patient fatigue  Pt missed 75 minutes of OT session due to fatigue. Pt noted to have difficulty staying awake in morning OT session and was sleeping upon OT arrival this afternoon. [name] reports that pt has been exhausted and having difficulty staying awake. [name] requested to allow pt to keep sleeping.   Patrick [name] 12/15/2021, 3:19 PM

## 2021-12-15 NOTE — Progress Notes (Addendum)
PROGRESS NOTE   Subjective/Complaints:  Pt in bed this AM.  Reports he slept very well. Last night was first night wife has been away.   ROS: + fatigue, +increased sight sided weakness, No CP or SOB,palpitations + chronic back pain  Objective:   No results found. No results for input(s): "WBC", "HGB", "HCT", "PLT" in the last 72 hours.  No results for input(s): "NA", "K", "CL", "CO2", "GLUCOSE", "BUN", "CREATININE", "CALCIUM" in the last 72 hours.   Intake/Output Summary (Last 24 hours) at 12/15/2021 1208 Last data filed at 12/15/2021 1000 Gross per 24 hour  Intake 220 ml  Output 600 ml  Net -380 ml         Physical Exam: Vital Signs Blood pressure (!) 118/59, pulse 78, temperature 98.4 F (36.9 C), resp. rate 16, height 6' (1.829 m), weight 65 kg, SpO2 98 %.  General: No acute distress Mood and affect are appropriate, pleasant Heart: Regular rate and rhythm no rubs murmurs or extra sounds Lungs: CTAB breathing unlabored, good air movement Abdomen: Positive bowel sounds, soft nontender to palpation, nondistended Extremities: No clubbing, cyanosis, or edema Skin: Warm, dry, No evidence of breakdown, no evidence of rash Neurologic: Cranial nerves II through XII intact, motor strength is 5/5 in bilateral deltoid, bicep, tricep, grip, hip flexor, knee extensors, ankle dorsiflexor and plantar flexor Sensory exam normal sensation to light touch and proprioception in bilateral upper and lower extremities Cerebellar exam normal finger to nose to finger as well as heel to shin in bilateral upper and lower extremities Musculoskeletal: Full range of motion in all 4 extremities. No joint swelling    Assessment/Plan: 1. Functional deficits which require 3+ hours per day of interdisciplinary therapy in a comprehensive inpatient rehab setting. Physiatrist is providing close team supervision and 24 hour management of active medical  problems listed below. Physiatrist and rehab team continue to assess barriers to discharge/monitor patient progress toward functional and medical goals  Care Tool:  Bathing    Body parts bathed by patient: Right arm, Left arm, Chest, Abdomen, Front perineal area, Right upper leg, Face, Left lower leg, Right lower leg, Left upper leg, Buttocks   Body parts bathed by helper: Buttocks     Bathing assist Assist Level: Supervision/Verbal cueing     Upper Body Dressing/Undressing Upper body dressing   What is the patient wearing?: Pull over shirt    Upper body assist Assist Level: Set up assist    Lower Body Dressing/Undressing Lower body dressing      What is the patient wearing?: Underwear/pull up, Pants     Lower body assist Assist for lower body dressing: Contact Guard/Touching assist     Toileting Toileting Toileting Activity did not occur (Clothing management and hygiene only): N/A (no void or bm)  Toileting assist Assist for toileting: Minimal Assistance - Patient > 75%     Transfers Chair/bed transfer  Transfers assist  Chair/bed transfer activity did not occur: Safety/medical concerns  Chair/bed transfer assist level: Minimal Assistance - Patient > 75%     Locomotion Ambulation   Ambulation assist      Assist level: Minimal Assistance - Patient > 75% Assistive device: No Device  Max distance: 150'   Walk 10 feet activity   Assist     Assist level: Minimal Assistance - Patient > 75% Assistive device: No Device   Walk 50 feet activity   Assist    Assist level: Minimal Assistance - Patient > 75% Assistive device: No Device    Walk 150 feet activity   Assist    Assist level: Minimal Assistance - Patient > 75% Assistive device: No Device    Walk 10 feet on uneven surface  activity   Assist     Assist level: Minimal Assistance - Patient > 75%     Wheelchair     Assist Is the patient using a wheelchair?: No              Wheelchair 50 feet with 2 turns activity    Assist            Wheelchair 150 feet activity     Assist          Blood pressure (!) 118/59, pulse 78, temperature 98.4 F (36.9 C), resp. rate 16, height 6' (1.829 m), weight 65 kg, SpO2 98 %.  Medical Problem List and Plan: 1. 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  Antithrombotics: -DVT/anticoagulation:  Mechanical: Sequential compression devices, below knee Bilateral lower extremities             -antiplatelet therapy: N/A 3. Spinal pain: On depakote for neuropathy (per chart review).             --Used oxycodone 10/325 mg 2-3 times a day.             -scheduling oxycodone at night 4. Mood/Behavior/Sleep: LCSW to follow for evaluation and support.              --Schedule oxycodone 5/325 mg/Hs for pain/sleep.              -antipsychotic agents: N/A  -Melatonin '3mg'$  started 8/2 for sleep  -He was started on Buspar '15mg'$  '5mg'$  BID on 8/2 to help with anxiety 5. Neuropsych/cognition: This patient is not capable of making decisions on his own behalf. 6. Skin/Wound Care: Routine pressure relief measures.  7. Fluids/Electrolytes/Nutrition: Monitor I/O. Encourage fluid intake.  8. HTN w/significant orthostatic changes: Monitor BP TID w/orthostatic vitals             --orthostatic changes have been ongoing for years per records --Continue Proscar, change to bedtime. Change florinef (added 07/28) to 7 am prior to therapy. --Encourage fluid intake.  Binder/TEDs daily when out of bed.  -8/1 he continues to have orthostatic hypotension, however improved overall from prior 9. CKD?: Will check CMET in am.  -Scr improved to 1.07 7/31, continue to encourage fluid intake 10. Mild ITP/Thrombocytopenia:  stable and mild     Latest Ref Rng & Units 12/12/2021    5:19 AM 12/10/2021    5:18 AM 12/07/2021    3:35 AM  CBC  WBC  4.0 - 10.5 K/uL 7.4  8.7  7.6   Hemoglobin 13.0 - 17.0 g/dL 13.6  13.3  13.7   Hematocrit 39.0 - 52.0 % 40.6  39.8  41.0   Platelets 150 - 400 K/uL 87  89  92                 --  Monitor for signs of bleeding.  11. CAD: Monitor for symptoms with increase in activity. Has been on florinef in the past.   12. H/o Food impaction/gastritis: Intake has been poor.  --Will order supervision for safety with meals/to assist w/meals.  13. Urgency/Dysuria: UC + for staph hemolytica- continue keflex for 5 day course-last dose 8/2 -Lidocaine Jelly and coude if IC needed -Will decrease frequency of PVR -On proscar at bedtime, medication options limited by orthostatic hypotension 8/3 has been continent 14. Fatigue: Vitamin D level 60, start 1,000U daily supplement.  15. Tachycardia: could be secondary to fludricortisone but this is helping BP, continue to monitor HR TID  -8/3 HR 78 stable 16. Transient increased in right sided weakness: CT head obtained and negative for acute pathology, We discussed initial location of bleed , as well as subsequent CT findings  17. Constipation  -8/1 Schedule miralax daily  -Sorbitol PRN added  -LBM 8/3 18. Moderate malnutrition  -Dietician started Magic cup, MVI, Ensure max    LOS: 6 days A FACE TO FACE EVALUATION WAS PERFORMED  Jennye Boroughs 12/15/2021, 12:08 PM

## 2021-12-16 MED ORDER — SORBITOL 70 % SOLN
60.0000 mL | Freq: Once | Status: AC
  Administered 2021-12-16: 60 mL via ORAL
  Filled 2021-12-16: qty 60

## 2021-12-16 MED ORDER — SORBITOL 70 % SOLN
60.0000 mL | Freq: Once | Status: DC
Start: 2021-12-16 — End: 2021-12-16

## 2021-12-16 NOTE — Progress Notes (Signed)
Scheduled and PRN bowel medications given 60 ml of sobitol and a 10 mg bisacodyl suppository with no bowel movement observed

## 2021-12-16 NOTE — Progress Notes (Addendum)
PROGRESS NOTE   Subjective/Complaints:  In bed this AM, wife at beside. He was sleepy yesterday but feels better today.   ROS: + fatigue, +increased right sided weakness, No cough, CP or SOB,palpitations. + chronic back pain  Objective:   No results found. No results for input(s): "WBC", "HGB", "HCT", "PLT" in the last 72 hours.  No results for input(s): "NA", "K", "CL", "CO2", "GLUCOSE", "BUN", "CREATININE", "CALCIUM" in the last 72 hours.   Intake/Output Summary (Last 24 hours) at 12/16/2021 1042 Last data filed at 12/16/2021 0530 Gross per 24 hour  Intake 460 ml  Output 1600 ml  Net -1140 ml         Physical Exam: Vital Signs Blood pressure (!) 143/64, pulse 75, temperature 98.1 F (36.7 C), resp. rate 19, height 6' (1.829 m), weight 65 kg, SpO2 96 %.  General: No acute distress, sitting in bed Mood and affect are appropriate, pleasant Heart: Regular rate and rhythm no rubs murmurs or extra sounds Lungs: CTAB breathing unlabored Abdomen: Positive bowel sounds, soft nontender to palpation, nondistended Extremities: No clubbing, cyanosis, or edema Skin: Warm, dry, No evidence of breakdown, no evidence of rash Neurologic: Cranial nerves II through XII intact, motor strength is 5/5 in bilateral deltoid, bicep, tricep, grip, hip flexor, knee extensors, ankle dorsiflexor and plantar flexor Sensory exam normal sensation to light touch and proprioception in bilateral upper and lower extremities Cerebellar exam normal finger to nose to finger as well as heel to shin in bilateral upper and lower extremities Musculoskeletal: Full range of motion in all 4 extremities. No joint swelling    Assessment/Plan: 1. Functional deficits which require 3+ hours per day of interdisciplinary therapy in a comprehensive inpatient rehab setting. Physiatrist is providing close team supervision and 24 hour management of active medical  problems listed below. Physiatrist and rehab team continue to assess barriers to discharge/monitor patient progress toward functional and medical goals  Care Tool:  Bathing    Body parts bathed by patient: Right arm, Left arm, Chest, Abdomen, Front perineal area, Right upper leg, Face, Left lower leg, Right lower leg, Left upper leg, Buttocks   Body parts bathed by helper: Buttocks     Bathing assist Assist Level: Supervision/Verbal cueing     Upper Body Dressing/Undressing Upper body dressing   What is the patient wearing?: Pull over shirt    Upper body assist Assist Level: Set up assist    Lower Body Dressing/Undressing Lower body dressing      What is the patient wearing?: Underwear/pull up, Pants     Lower body assist Assist for lower body dressing: Contact Guard/Touching assist     Toileting Toileting Toileting Activity did not occur (Clothing management and hygiene only): N/A (no void or bm)  Toileting assist Assist for toileting: Minimal Assistance - Patient > 75%     Transfers Chair/bed transfer  Transfers assist  Chair/bed transfer activity did not occur: Safety/medical concerns  Chair/bed transfer assist level: Minimal Assistance - Patient > 75%     Locomotion Ambulation   Ambulation assist      Assist level: Minimal Assistance - Patient > 75% Assistive device: Walker-rolling Max distance: 150  Walk 10 feet activity   Assist     Assist level: Minimal Assistance - Patient > 75% Assistive device: Walker-rolling   Walk 50 feet activity   Assist    Assist level: Minimal Assistance - Patient > 75% Assistive device: Walker-rolling    Walk 150 feet activity   Assist    Assist level: Minimal Assistance - Patient > 75% Assistive device: Walker-rolling    Walk 10 feet on uneven surface  activity   Assist     Assist level: Minimal Assistance - Patient > 75%     Wheelchair     Assist Is the patient using a wheelchair?:  No             Wheelchair 50 feet with 2 turns activity    Assist            Wheelchair 150 feet activity     Assist          Blood pressure (!) 143/64, pulse 75, temperature 98.1 F (36.7 C), resp. rate 19, height 6' (1.829 m), weight 65 kg, SpO2 96 %.  Medical Problem List and Plan: 1. 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  Antithrombotics: -DVT/anticoagulation:  Mechanical: Sequential compression devices, below knee Bilateral lower extremities             -antiplatelet therapy: N/A 3. Spinal pain: On depakote for neuropathy (per chart review).             --Used oxycodone 10/325 mg 2-3 times a day.             -scheduling oxycodone at night 4. Mood/Behavior/Sleep: LCSW to follow for evaluation and support.              --Schedule oxycodone 5/325 mg/Hs for pain/sleep.              -antipsychotic agents: N/A  -Melatonin '3mg'$  started 8/2 for sleep  -He was started on Buspar '15mg'$  '5mg'$  BID on 8/2 to help with anxiety  -Will stop melatonin 8/4  5. Neuropsych/cognition: This patient is not capable of making decisions on his own behalf. 6. Skin/Wound Care: Routine pressure relief measures.  7. Fluids/Electrolytes/Nutrition: Monitor I/O. Encourage fluid intake.  8. HTN w/significant orthostatic changes: Monitor BP TID w/orthostatic vitals             --orthostatic changes have been ongoing for years per records --Continue Proscar, change to bedtime. Change florinef (added 07/28) to 7 am prior to therapy. --Encourage fluid intake.  Binder/TEDs daily when out of bed.  -8/1 he continues to have orthostatic hypotension, however improved overall from prior 9. CKD?: Will check CMET in am.  -Scr improved to 1.07 7/31, continue to encourage fluid intake 10. Mild ITP/Thrombocytopenia:  stable and mild     Latest Ref Rng & Units 12/12/2021    5:19 AM  12/10/2021    5:18 AM 12/07/2021    3:35 AM  CBC  WBC 4.0 - 10.5 K/uL 7.4  8.7  7.6   Hemoglobin 13.0 - 17.0 g/dL 13.6  13.3  13.7   Hematocrit 39.0 - 52.0 % 40.6  39.8  41.0   Platelets 150 - 400 K/uL 87  89  92                 --  Monitor for signs of bleeding.  11. CAD: Monitor for symptoms with increase in activity. Has been on florinef in the past.   12. H/o Food impaction/gastritis: Intake has been poor.  --Will order supervision for safety with meals/to assist w/meals.  13. Urgency/Dysuria: UC + for staph hemolytica- continue keflex for 5 day course-last dose 8/2 -Lidocaine Jelly and coude if IC needed -Will decrease frequency of PVR -On proscar at bedtime, medication options limited by orthostatic hypotension 8/3 has been continent  14. Fatigue: Vitamin D level 60, start 1,000U daily supplement.  15. Tachycardia: could be secondary to fludricortisone but this is helping BP, continue to monitor HR TID  -8/4 HR stable at 75 16. Transient increased in right sided weakness: CT head obtained and negative for acute pathology, We discussed initial location of bleed , as well as subsequent CT findings  17. Constipation  -8/1 Schedule miralax daily  -8/4 sorbitol '60mg'$  ordered 18. Moderate malnutrition  -Dietician started Magic cup, MVI, Ensure max    LOS: 7 days A FACE TO FACE EVALUATION WAS PERFORMED  Jennye Boroughs 12/16/2021, 10:42 AM

## 2021-12-16 NOTE — Progress Notes (Signed)
Patient ID: Patrick [name], male   DOB: [birth year], 88 y.o.   MRN: [id number]  request by wife to order a over bed table she is aware it is private pay. Have made referral to [employer] for this. Wife reports they have a 3 in 1 so have cancelled this.

## 2021-12-16 NOTE — Progress Notes (Signed)
Occupational Therapy Session Note  Patient Details  Name: Patrick [name] MRN: [id number] Date of Birth: 1926/12/17  Today's Date: 12/16/2021 OT Individual Time: 9450-3888 OT Individual Time Calculation (min): 57 min    Short Term Goals: Week 2:  Week 2 STG = LTG  Skilled Therapeutic Interventions/Progress Updates:  Pt seated up in w/c with spouse present upon OT arrival to the room. Pt reports, "I'm still here." Pt in agreement for OT session.  Therapy Documentation Precautions:  Precautions Precautions: Fall Precaution Comments: monitor BP (orthostatic hypotension), minimal impulsivity Restrictions Weight Bearing Restrictions: No Vital Signs: Please see "Flowsheet" for most recent vitals charted by nursing staff.  Pain: Pain Assessment Pain Scale: 0-10 Pain Score: 0-No pain  ADL: Pt declines need to perform ADL's at this time.  Pre-Community Mobility: Pt participates in a pre-community mobility task to improve safety and independence with functional mobility outdoors. Pt transported outside via w/c. Pt able to perform functional ambulation ~20' x 2 trials with a seated rest break between trials, minimal assistance, and moderate VC's for proper advancement of BLE (to not shuffle feet) and for safe walker management. Pt had increased difficulty with managing walker on concrete on the sidewalk due to increased friction. Pt frequently would push walker out in front of pt or pick up the walker. Pt lifted the walker off the ground high enough that the legs of the walker pointed out in front of the pt requiring minimal assistance for pt to maintain standing balance. Education provided to pt on fall risks and safety concerns with this.   Psychosocial Support: Pt became tearful various times during session when involved in social conversation and pt's recalling events from the past such as working and relationships with family. OT provides therapeutic and empathetic listening. OT  provides education on how the location of pt's CVA can impact pt's personality characteristics or present with some emotional lability. OT provides education on the aging process and how pt is in more of a "reflective" stage as well which paired with location of CVA can cause increased emotions when processing life events. Pt verbalized understanding. [name] reports concerns throughout admission that occur outside of therapy sessions. OT provided empathetic listening. [name] asked questions about therapy after DC. OT provided education on how [facility] OT can potentially perform a home evaluation to provide recommendations, decrease fall risks within the home, and assist with aging in place. Spouse verbalized understanding.   Pt requested to stay in the w/c at end of session. Pt left sitting comfortably in the w/c with personal belongings and call light within reach, belt alarm placed and activated, and comfort needs attended to.   Therapy/Group: Individual Therapy  [name] 12/16/2021, 4:50 PM

## 2021-12-16 NOTE — Progress Notes (Signed)
Physical Therapy Session Note  Patient Details  Name: Patrick [name] MRN: [id number] Date of Birth: Jul 07, 1926  Today's Date: 12/16/2021 PT Individual Time: 1115-1157 + 1415-1530 PT Individual Time Calculation (min): 42 min  + 75 min  Short Term Goals: Week 1:  PT Short Term Goal 1 (Week 1): Pt will perform bed mobility with CGA. PT Short Term Goal 2 (Week 1): Pt will completes bed to chair transfer consistently with CGA. PT Short Term Goal 3 (Week 1): Pt will ambulate x150' with CGA and LRAD. PT Short Term Goal 4 (Week 1): Pt will complete x2 steps with R hand rail and CGA  Skilled Therapeutic Interventions/Progress Updates:      1st session: Pt resting in bed on arrival - awakens easily to voice and is agreeable to therapy session. Denies pain. Reports feeling better than yesterday in regards to energy level. Supine<>sitting EOB with supervision with HOB nearly flat. While sitting EOB, assisted with donning slip-on shoes with maxA for time management. Squat<>pivot transfer with CGA from EOB to w/c, towards his L.   Transported in w/c to main rehab gym for time and energy conservation. Assisted to mat table in similar manner. Gait training 170f with CGA and RW, cues for keeping body within walker frame, slowing down speed, [name], [name], [name], [name], [name], upright posture, [name], [name], ~15051f cues for keeping body within walker frame, [name], [name], [name], [name], [name], [name], using BUE/BLE for global strengthening and cardiovascular endurance. Played self selected "Patrick [name]" music which pt really enjoyed!   Returned to his room and assisted back to bed with minA squat<>pivot transfer. Bed mobility completed with supervision. All needs met, [name], minimal impulsivity Restrictions Weight Bearing Restrictions: No General:     Therapy/Group: Individual Therapy  Patrick [name] 12/16/2021, 7:29 AM

## 2021-12-16 NOTE — Progress Notes (Signed)
Occupational Therapy Weekly Progress Note  Patient Details  Name: Patrick [name] MRN: [id number] Date of Birth: 10-09-1926  Beginning of progress report period: December 10, 2021 End of progress report period: December 16, 2021  Today's Date: 12/16/2021 OT Individual Time: 0950-1050 OT Individual Time Calculation (min): 60 min    Patient has met 4 of 5 short term goals.  He requires min assist for lower body dressing due to ted hose.  Patient has shown significant improvement in activity tolerance, sitting balance, and stand tolerance.    Patient continues to demonstrate the following deficits: muscle weakness, decreased cardiorespiratoy endurance, decreased coordination and decreased motor planning, decreased attention to right and decreased motor planning, decreased initiation, decreased attention, decreased awareness, and decreased safety awareness, and decreased sitting balance, decreased standing balance, decreased postural control, hemiplegia, and decreased balance strategies and therefore will continue to benefit from skilled OT intervention to enhance overall performance with BADL.  Patient progressing toward long term goals..  Continue plan of care.  OT Short Term Goals Week 2:   STG = LTG   Skilled Therapeutic Interventions/Progress Updates:    Patient received supine in bed - wife at bedside.  Patient agreeable to get up and shower - declined need to toilet.  Patient has not had BM in several days per wife.  Patient indicates no need.  Patient showered seated - able to safely reach for feet process steps, control shower head.  Dried self seated on bench.  Walked to/from bathroom with contact guard and using RW.  Patient used large mirror at sink to imrove ability to see while shaving.  Patient shaved himself today - orienting razor appropriately as needed.  Dressed himself with only min assist to don ted hose.  Patient left in bed with bed alarm set, and call bell in hand.  Reoriented  patient to call bell functions for TV.    Therapy Documentation Precautions:  Precautions Precautions: Fall Precaution Comments: monitor BP (orthostatic hypotension), minimal impulsivity Restrictions Weight Bearing Restrictions: No  Pain:  Denies pain    Therapy/Group: Individual Therapy  Mariah Milling 12/16/2021, 12:57 PM

## 2021-12-17 MED ORDER — TAMSULOSIN HCL 0.4 MG PO CAPS
0.4000 mg | ORAL_CAPSULE | Freq: Every day | ORAL | Status: DC
  Administered 2021-12-17 – 2021-12-19 (×3): 0.4 mg via ORAL
  Filled 2021-12-17 (×3): qty 1

## 2021-12-17 MED ORDER — NYSTATIN 100000 UNIT/ML MT SUSP
5.0000 mL | Freq: Four times a day (QID) | OROMUCOSAL | Status: DC
  Administered 2021-12-17 – 2021-12-22 (×20): 500000 [IU] via ORAL
  Filled 2021-12-17 (×20): qty 5

## 2021-12-17 NOTE — Progress Notes (Signed)
Poor night sleep. Wife stayed and assisted with urinal. Urinary frequency, continent and incontinent. Urine orange. Bladder scan at 0645=342cc's. Wife reports poor PO intake. Patient declined scheduled percocet at HS. Patrici Ranks A

## 2021-12-17 NOTE — Progress Notes (Signed)
Physical Therapy Session Note  Patient Details  Name: Patrick [name] MRN: [id number] Date of Birth: [birth year]  Today's Date: 12/17/2021 PT Individual Time: 7628-3151 PT Individual Time Calculation (min): 45 min   Short Term Goals: Week 1:  PT Short Term Goal 1 (Week 1): Pt will perform bed mobility with CGA. PT Short Term Goal 2 (Week 1): Pt will completes bed to chair transfer consistently with CGA. PT Short Term Goal 3 (Week 1): Pt will ambulate x150' with CGA and LRAD. PT Short Term Goal 4 (Week 1): Pt will complete x2 steps with R hand rail and CGA  Skilled Therapeutic Interventions/Progress Updates:  Pt in bed with spouse/family at bedside. Agreeable to PT at this time. No pain.  BED MOBILITY: Supine>EOB with supervision, HOB 30 degrees and rail used. Pt able to sit EOB with supervision to self don bil shoes. Supervision for sitting EOB>supine in bed.    TRANSFERS: Sit<>stands through out session from bed/mat/chair<>RW. Cues needed each time for hand placement and general safety with use of walker.   STRENGTHENING:  NuStep UE/LE's level 5 x 10 minutes with goal >/=50 steps per minute for strengthening, reciprocal movement patterns and to address activity tolerance. Cues needed on upright posture at times throughout.    GAIT: Gait pattern: step through pattern, knee flexed in stance- Right, and trunk flexed Distance walked: from room to<>from mat table in dayroom Assistive device utilized: Walker - 2 wheeled Level of assistance: CGA and Min A Comments: cues/facilitation for upright posture, walker position as pt tends to push walker too far out and to slow down for general safety with use of walker. Wheelchair pulled behind pt for safety.   BALANCE/NMR:  Tossing horse shoes with no UE support, CGA to min assist with cues on posture/weight shifting. Did this for 3 reps with all horseshoes with pt keeping feet hip width or slight wider and on 3rd reps pt doing mini squat  position to try to "slide" the horse shoe to goal.   Pt left in bed with alarm on, needs in reach and spouse/family at bedside.   Therapy Documentation Precautions:  Precautions Precautions: Fall Precaution Comments: monitor BP (orthostatic hypotension), minimal impulsivity Restrictions Weight Bearing Restrictions: No General:   Vital Signs: Therapy Vitals Temp: 98.1 F (36.7 C) Pulse Rate: 79 Resp: 16 BP: (!) 134/39 Patient Position (if appropriate): Lying Oxygen Therapy SpO2: 98 % O2 Device: Room Air     Therapy/Group: Individual Therapy  Willow Ora, PTA, [place] 12/17/21, 4:02 PM

## 2021-12-17 NOTE — Progress Notes (Signed)
PROGRESS NOTE   Subjective/Complaints:  Pt reports "tired' but OK.  Woke to void at least 5-6x overnight- kept waking wife to help with urinal- small amounts each time.  Also had 330cc in bladder overnight- wasn't cathed.  LBM 3-4 days ago- no results with sorbitol.  Willing to try and use flomax- will stop proscar.  Also c/o no appetite for last few days and foods taste weird.    ROS:  Pt denies SOB, abd pain, CP, N/V/C/D, and vision changes Except for HPI, is negative Objective:   No results found. No results for input(s): "WBC", "HGB", "HCT", "PLT" in the last 72 hours.  No results for input(s): "NA", "K", "CL", "CO2", "GLUCOSE", "BUN", "CREATININE", "CALCIUM" in the last 72 hours.   Intake/Output Summary (Last 24 hours) at 12/17/2021 1800 Last data filed at 12/17/2021 1701 Gross per 24 hour  Intake 840 ml  Output 1690 ml  Net -850 ml        Physical Exam: Vital Signs Blood pressure (!) 134/39, pulse 79, temperature 98.1 F (36.7 C), resp. rate 16, height 6' (1.829 m), weight 65 kg, SpO2 98 %.    General: awake, alert, appropriate, slowed responses; supine in bed; wife at bedside; NAD HENT: conjugate gaze; oropharynx dry- tongue furrowed, reddened and mild thrush noted CV: regular rate; no JVD Pulmonary: CTA B/L; no W/R/R- good air movement GI: soft, NT, ND,  can feel stool down low- hypoactive BS Psychiatric: appropriate; flat but interactive Neurological: alert, more alert than last 2 days per wife Neurologic: Cranial nerves II through XII intact, motor strength is 5/5 in bilateral deltoid, bicep, tricep, grip, hip flexor, knee extensors, ankle dorsiflexor and plantar flexor Sensory exam normal sensation to light touch and proprioception in bilateral upper and lower extremities Cerebellar exam normal finger to nose to finger as well as heel to shin in bilateral upper and lower extremities Musculoskeletal:  Full range of motion in all 4 extremities. No joint swelling    Assessment/Plan: 1. Functional deficits which require 3+ hours per day of interdisciplinary therapy in a comprehensive inpatient rehab setting. Physiatrist is providing close team supervision and 24 hour management of active medical problems listed below. Physiatrist and rehab team continue to assess barriers to discharge/monitor patient progress toward functional and medical goals  Care Tool:  Bathing    Body parts bathed by patient: Right arm, Left arm, Chest, Abdomen, Front perineal area, Right upper leg, Face, Left lower leg, Right lower leg, Left upper leg, Buttocks   Body parts bathed by helper: Buttocks     Bathing assist Assist Level: Supervision/Verbal cueing     Upper Body Dressing/Undressing Upper body dressing   What is the patient wearing?: Pull over shirt    Upper body assist Assist Level: Independent    Lower Body Dressing/Undressing Lower body dressing      What is the patient wearing?: Underwear/pull up, Pants     Lower body assist Assist for lower body dressing: Supervision/Verbal cueing     Toileting Toileting Toileting Activity did not occur (Clothing management and hygiene only): N/A (no void or bm)  Toileting assist Assist for toileting: Minimal Assistance - Patient > 75%  Transfers Chair/bed transfer  Transfers assist  Chair/bed transfer activity did not occur: Safety/medical concerns  Chair/bed transfer assist level: Minimal Assistance - Patient > 75%     Locomotion Ambulation   Ambulation assist      Assist level: Minimal Assistance - Patient > 75% Assistive device: Walker-rolling Max distance: 150   Walk 10 feet activity   Assist     Assist level: Minimal Assistance - Patient > 75% Assistive device: Walker-rolling   Walk 50 feet activity   Assist    Assist level: Minimal Assistance - Patient > 75% Assistive device: Walker-rolling    Walk 150  feet activity   Assist    Assist level: Minimal Assistance - Patient > 75% Assistive device: Walker-rolling    Walk 10 feet on uneven surface  activity   Assist     Assist level: Minimal Assistance - Patient > 75%     Wheelchair     Assist Is the patient using a wheelchair?: No             Wheelchair 50 feet with 2 turns activity    Assist            Wheelchair 150 feet activity     Assist          Blood pressure (!) 134/39, pulse 79, temperature 98.1 F (36.7 C), resp. rate 16, height 6' (1.829 m), weight 65 kg, SpO2 98 %.  Medical Problem List and Plan: 1. 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  Antithrombotics: -DVT/anticoagulation:  Mechanical: Sequential compression devices, below knee Bilateral lower extremities             -antiplatelet therapy: N/A 3. Spinal pain: On depakote for neuropathy (per chart review).             --Used oxycodone 10/325 mg 2-3 times a day.             -scheduling oxycodone at night 4. Mood/Behavior/Sleep: LCSW to follow for evaluation and support.              --Schedule oxycodone 5/325 mg/Hs for pain/sleep.              -antipsychotic agents: N/A  -Melatonin '3mg'$  started 8/2 for sleep  -He was started on Buspar '15mg'$  '5mg'$  BID on 8/2 to help with anxiety  -Will stop melatonin 8/4  5. Neuropsych/cognition: This patient is not capable of making decisions on his own behalf. 6. Skin/Wound Care: Routine pressure relief measures.  7. Fluids/Electrolytes/Nutrition: Monitor I/O. Encourage fluid intake.  8. HTN w/significant orthostatic changes: Monitor BP TID w/orthostatic vitals             --orthostatic changes have been ongoing for years per records --Continue Proscar, change to bedtime. Change florinef (added 07/28) to 7 am prior to therapy. --Encourage fluid intake.  Binder/TEDs daily  when out of bed.  -8/1 he continues to have orthostatic hypotension, however improved overall from prior 8/5- will change Proscar to Flomax since orthostatic hypotension better per pt- will schedule at night 9. CKD?: Will check CMET in am.  -Scr improved to 1.07 7/31, continue to encourage fluid intake 10. Mild ITP/Thrombocytopenia:  stable and mild  Latest Ref Rng & Units 12/12/2021    5:19 AM 12/10/2021    5:18 AM 12/07/2021    3:35 AM  CBC  WBC 4.0 - 10.5 K/uL 7.4  8.7  7.6   Hemoglobin 13.0 - 17.0 g/dL 13.6  13.3  13.7   Hematocrit 39.0 - 52.0 % 40.6  39.8  41.0   Platelets 150 - 400 K/uL 87  89  92                 --Monitor for signs of bleeding.  11. CAD: Monitor for symptoms with increase in activity. Has been on florinef in the past.   12. H/o Food impaction/gastritis: Intake has been poor.  --Will order supervision for safety with meals/to assist w/meals.  13. Urgency/Dysuria: UC + for staph hemolytica- continue keflex for 5 day course-last dose 8/2 -Lidocaine Jelly and coude if IC needed -Will decrease frequency of PVR -On proscar at bedtime, medication options limited by orthostatic hypotension 8/3 has been continent   8/5- required peeing many times at night- will try low dose Flomax tonight- but change if not hypotensive tonight/tomorrow 14. Fatigue: Vitamin D level 60, start 1,000U daily supplement.  15. Tachycardia: could be secondary to fludricortisone but this is helping BP, continue to monitor HR TID  -8/4 HR stable at 75 16. Transient increased in right sided weakness: CT head obtained and negative for acute pathology, We discussed initial location of bleed , as well as subsequent CT findings  17. Constipation  -8/1 Schedule miralax daily  -8/4 sorbitol '60mg'$  ordered  8/5- still no BM- will give more sorbitol and soap suds enema.  18. Moderate malnutrition  -Dietician started Magic cup, MVI, Ensure max  19, Thrush/decreased appetite/food "tastes weird"  8/5-  will start nystatin Swish and swallow QID   I spent a total of 41   minutes on total care today- >50% coordination of care- due to very prolonged time with pt and going over plan with nursing.   LOS: 8 days A FACE TO FACE EVALUATION WAS PERFORMED  Darneisha Windhorst 12/17/2021, 6:00 PM

## 2021-12-17 NOTE — Progress Notes (Signed)
Occupational Therapy Session Note  Patient Details  Name: Patrick [name] MRN: [id number] Date of Birth: Jun 08, 1926  Today's Date: 12/17/2021 OT Individual Time: 6144-3154 OT Individual Time Calculation (min): 64 min    Short Term Goals: Week 2:   STG = LTG  Skilled Therapeutic Interventions/Progress Updates:  Pt awake in bed with spouse present upon OT arrival to the room. Pt reports, "I wish that I was there," referring to something that happened with this therapist yesterday. Pt in agreement for OT session.  Therapy Documentation Precautions:  Precautions Precautions: Fall Precaution Comments: monitor BP (orthostatic hypotension), minimal impulsivity Restrictions Weight Bearing Restrictions: No Vital Signs: Please see "Flowsheet" for most recent vitals charted by nursing staff.  Pain: Pain Assessment Pain Scale: 0-10 Pain Score: 0-No pain  ADL: Grooming: Minimal assistance, Minimal cueing (Pt able to shave while seated in w/c at the sink with minimal assistance to guide razor to starting position and minimal prompts for thoroughness/sequencing.) Where Assessed-Grooming: Sitting at sink, Wheelchair Upper Body Bathing: Supervision/safety (Pt able to bathe all UB parts with close supervision for safety while seated on tub-bench.) Where Assessed-Upper Body Bathing: Shower Lower Body Bathing: Contact guard (Pt able to bathe all LB parts with CGA to ensure sitting balance while leaning to bathe buttocks while seated on tub-bench.) Where Assessed-Lower Body Bathing: Shower Upper Body Dressing: Supervision/safety (Pt able to doff and don a pull-over sttyle shirt with supervision while seated.) Where Assessed-Upper Body Dressing:  (tub-bench & BSC before/after shower) Lower Body Dressing: Contact guard (Pt able to doff and don briefs and pants with minimal assistance for clothing adjustments and to fully pull pants up over buttocks with minimal assist to ensure standing balance with  use of FWW.) Where Assessed-Lower Body Dressing:  (toilet & BSC before/after shower) Toileting: Minimal assistance (Pt able to void bladder and perform clothing management with minimal assistance for standing balance with use of FWW.) Where Assessed-Toileting: Glass blower/designer: Minimal assistance (Pt able to complete ambulatory transfer from EOB > toilet with minimal assistance and use of FWW for safety.) Toilet Transfer Method: Counselling psychologist: Energy manager: Minimal assistance (Pt able to complete ambulatory transfer with minimal assistance from toilet > tub-bench in shower with use of FWW and grab bars.) Social research officer, government Method: [employer]: Grab bars, Transfer tub bench ADL Comments: Pt participates well in ADLs. Pt able to sustain grasp on bar soap throughout bathing tasks. Pt requires minimal VCs for safe walker management and CGA - minimal assist to ensure standing balance. OT provides assistance to don new TED hose for proper hygiene after shower. Pt requires increased time to complete ADL due to endurance deficits and needing prompts for safety.   Pt requested to stay in the w/c at end of session. Pt left sitting comfortably in the w/c with personal belongings and call light within reach, belt alarm placed and activated, spouse present in room, and comfort needs attended to. Spouse planning to provide assistance for pt to perform oral care at end of session.   Therapy/Group: Individual Therapy  [name] 12/17/2021, 1:03 PM

## 2021-12-18 NOTE — Progress Notes (Signed)
Slept better last night. Decreased urinary frequency. Sweaty this morning requiring t-shirt and bed pad change. Vitals good. No BM, declined PRN loc's. Wife at bedside providing hands on assistance. Patrick [name] A

## 2021-12-18 NOTE — Progress Notes (Signed)
Physical Therapy Session Note  Patient Details  Name: Patrick [name] MRN: [id number] Date of Birth: [birth year]  Today's Date: 12/18/2021 PT Individual Time: 1345-1445 PT Individual Time Calculation (min): 60 min   Short Term Goals: Week 1:  PT Short Term Goal 1 (Week 1): Pt will perform bed mobility with CGA. PT Short Term Goal 2 (Week 1): Pt will completes bed to chair transfer consistently with CGA. PT Short Term Goal 3 (Week 1): Pt will ambulate x150' with CGA and LRAD. PT Short Term Goal 4 (Week 1): Pt will complete x2 steps with R hand rail and CGA   Skilled Therapeutic Interventions/Progress Updates:      Pt supine in bed to start - wife at bedside and pt agreeable to therapy. Denies pain. Supine<>sitting EOB with supervision. Wife assisted with donning regular socks instead of compression socks. Monitored symptoms for hypotension during session - pt asymptomatic during treatment. Donned shoes with minA for sliding heel into shoe - pt uses shoe horn at baseline. Sit<>stand and stand<>pivot transfer with CGA and no AD from EOB to w/c. Transported patient outdoors near [facility] to work on community reintegration, gait outdoors, and education on stroke recovery and B.E.F.A.S.T symptoms for DC. Due to pt's STM impairments, pt freuqently asking repetitive questions without awareness of repeating himself. 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 Gait outdoors with CGA overalll on unlevel concrete, including transferring from park benches - pt ambulating ~149ft distances. Pt requiring minA for safely navigating throw rug at hospital entrance - pt lifting up walker and walking with it in the air, poor safety awareness.   Returned upstairs to [employer] floor - assisted onto [employer]. SetupA with resistance set to L6, BUE/BLE to target general strengthening and activity tolerance. Pt playing self selected music to  improve affect and mood - pt singing cheerfully during activity.  Completed for 12 minutes, maintaining ~60 steps/minute cadence with a total of 667 steps.   Returned to room in w/c for time management. Ambulatory transfer with CGA and RW from w/c to hospital bed - mod cues for safety approach to bed with poor safety awareness with RW management while turning. Pt also easily distracted from other staff in room and this contributes to unsteadiness. Bed mobility completed without assist. Remained in bed with bed alarm on, all needs met.   Therapy Documentation Precautions:  Precautions Precautions: Fall Precaution Comments: monitor BP (orthostatic hypotension), minimal impulsivity Restrictions Weight Bearing Restrictions: No General:     Therapy/Group: Individual Therapy  Taneasha Fuqua P Shankar Silber PT 12/18/2021, 7:48 AM

## 2021-12-18 NOTE — Progress Notes (Signed)
Occupational Therapy Session Note  Patient Details  Name: [place] H Husk MRN: [id number] Date of Birth: 05/16/1926  Today's Date: 12/18/2021 OT Individual Time: 1030-1120 OT Individual Time Calculation (min): 50 min    Short Term Goals: Week 1:  OT Short Term Goal 1 (Week 1): Pt will complete grooming tasks with CGA standing at the sink with use of LRAD. OT Short Term Goal 1 - Progress (Week 1): Met OT Short Term Goal 2 (Week 1): Pt will complete UB dressing with supervision while seated. OT Short Term Goal 2 - Progress (Week 1): Met OT Short Term Goal 3 (Week 1): Pt will complete LB dressing using AE and LRAD PRN. OT Short Term Goal 3 - Progress (Week 1): Partly met OT Short Term Goal 4 (Week 1): Pt will complete toilet transfer with CGA using LRAD. OT Short Term Goal 4 - Progress (Week 1): Met OT Short Term Goal 5 (Week 1): Pt will complete toileting tasks with CGA with use of LRAD PRN. OT Short Term Goal 5 - Progress (Week 1): Met Week 2:     Skilled Therapeutic Interventions/Progress Updates:    Patient in bed upon arrival with wife present during skilled OT visit.  The pt performed a BADL related task in showering, he was able to transfer from bed LOF to w/c with MinA using the RW and initial prompts for safe performance.  The pt was able to transfer from the w/c to the shower bench using the grab bars  with MinA and v/c's for proper placement. The pt was able to remove his shirt with an initial cues and S, the pt was able to doff his pants with SBA incorporating the grab bars for additional safety.  The  pt was able to bathe his UB/LB with MinA while incorporating the grab bars for coming from sit to stand to wash is bottom.  The pt was able to transfer from the shower bench to the w/c with CGA using the grab bars for dynamic balance.  The pt was able to donn his UB/LB clothing items inclusive of a over head shirt with S/U assist and his brief and pants with MinA.  The pt was able to  donn his shoes with SBA and additional time. The pt remained at w/c LOF with his call light and bedside table within reach.  The chair alarm was activated and the pt had no c/o pain this treatment session.   Therapy Documentation Precautions:  Precautions Precautions: Fall Precaution Comments: monitor BP (orthostatic hypotension), minimal impulsivity Restrictions Weight Bearing Restrictions: No  Therapy/Group: Individual Therapy  Yvonne Kendall 12/18/2021, 12:51 PM2

## 2021-12-19 DIAGNOSIS — N189 Chronic kidney disease, unspecified: Secondary | ICD-10-CM

## 2021-12-19 LAB — BASIC METABOLIC PANEL
Anion gap: 9 (ref 5–15)
BUN: 15 mg/dL (ref 8–23)
CO2: 25 mmol/L (ref 22–32)
Calcium: 8.7 mg/dL — ABNORMAL LOW (ref 8.9–10.3)
Chloride: 107 mmol/L (ref 98–111)
Creatinine, Ser: 1.17 mg/dL (ref 0.61–1.24)
GFR, Estimated: 58 mL/min — ABNORMAL LOW (ref >60)
Glucose, Bld: 160 mg/dL — ABNORMAL HIGH (ref 70–99)
Potassium: 4.3 mmol/L (ref 3.5–5.1)
Sodium: 141 mmol/L (ref 135–145)

## 2021-12-19 LAB — CBC
HCT: 39.5 % (ref 39.0–52.0)
Hemoglobin: 13 g/dL (ref 13.0–17.0)
MCH: 32.3 pg (ref 26.0–34.0)
MCHC: 32.9 g/dL (ref 30.0–36.0)
MCV: 98 fL (ref 80.0–100.0)
Platelets: 117 10*3/uL — ABNORMAL LOW (ref 150–400)
RBC: 4.03 MIL/uL — ABNORMAL LOW (ref 4.22–5.81)
RDW: 13.3 % (ref 11.5–15.5)
WBC: 5.7 10*3/uL (ref 4.0–10.5)
nRBC: 0 % (ref 0.0–0.2)

## 2021-12-19 NOTE — Discharge Instructions (Signed)
Inpatient Rehab Discharge Instructions  Patrick [name] Discharge date and time:  12/22/21  Activities/Precautions/ Functional Status: Activity: activity as tolerated Diet: cardiac diet Wound Care: none needed   Functional status:  ___ No restrictions     ___ Walk up steps independently _X__ 24/7 supervision/assistance   ___ Walk up steps with assistance ___ Intermittent supervision/assistance  ___ Bathe/dress independently ___ Walk with walker     ___ Bathe/dress with assistance ___ Walk Independently    ___ Shower independently ___ Walk with assistance    ___ Shower with assistance _X__ No alcohol     ___ Return to work/school ________   Special Instructions:  STROKE/TIA DISCHARGE INSTRUCTIONS SMOKING Cigarette smoking nearly doubles your risk of having a stroke & is the single most alterable risk factor  If you smoke or have smoked in the last 12 months, you are advised to quit smoking for your health. Most of the excess cardiovascular risk related to smoking disappears within a year of stopping. Ask you doctor about anti-smoking medications [place] Quit Line: 1-800-QUIT NOW Free Smoking Cessation Classes [phone]  CHOLESTEROL Know your levels; limit fat & cholesterol in your diet  Lipid Panel     Component Value Date/Time   CHOL 133 03/02/2021 1117   TRIG 92 03/02/2021 1117   HDL 43 03/02/2021 1117   CHOLHDL 3.1 03/02/2021 1117   CHOLHDL 2.2 11/15/2018 0617   VLDL 8 11/15/2018 0617   LDLCALC 72 03/02/2021 1117     Many patients benefit from treatment even if their cholesterol is at goal. Goal: Total Cholesterol (CHOL) less than 160 Goal:  Triglycerides (TRIG) less than 150 Goal:  HDL greater than 40 Goal:  LDL (LDLCALC) less than 100   BLOOD PRESSURE American Stroke Association blood pressure target is less that 120/80 mm/Hg  Your discharge blood pressure is:  BP: (!) 144/46 Monitor your blood pressure Limit your salt and alcohol intake Many individuals will  require more than one medication for high blood pressure  DIABETES (A1c is a blood sugar average for last 3 months) Goal HGBA1c is under 7% (HBGA1c is blood sugar average for last 3 months)  Diabetes: No known diagnosis of diabetes    No results found for: "HGBA1C"  Your HGBA1c can be lowered with medications, healthy diet, and exercise. Check your blood sugar as directed by your physician Call your physician if you experience unexplained or low blood sugars.  PHYSICAL ACTIVITY/REHABILITATION Goal is 30 minutes at least 4 days per week  Activity: No driving, Therapies: See above Return to work: N/A Activity decreases your risk of heart attack and stroke and makes your heart stronger.  It helps control your weight and blood pressure; helps you relax and can improve your mood. Participate in a regular exercise program. Talk with your doctor about the best form of exercise for you (dancing, walking, swimming, cycling).  DIET/WEIGHT Goal is to maintain a healthy weight  Your discharge diet is:  Diet Order             Diet regular Room service appropriate? Yes with Assist; Fluid consistency: Thin  Diet effective now                   liquids Your height is:  Height: 6' (182.9 cm) Your current weight is: Weight: 65.8 kg Your Body Mass Index (BMI) is:  BMI (Calculated): 19.67 Following the type of diet specifically designed for you will help prevent another stroke. You are at goal weight  Your goal Body Mass Index (BMI) is 19-24. Healthy food habits can help reduce 3 risk factors for stroke:  High cholesterol, hypertension, and excess weight.  RESOURCES Stroke/Support Group:  Call [phone]   STROKE EDUCATION PROVIDED/REVIEWED AND GIVEN TO PATIENT Stroke warning signs and symptoms How to activate emergency medical system (call 911). Medications prescribed at discharge. Need for follow-up after discharge. Personal risk factors for stroke. Pneumonia vaccine given:  Flu vaccine  given:  My questions have been answered, the writing is legible, and I understand these instructions.  I will adhere to these goals & educational materials that have been provided to me after my discharge from the hospital.       My questions have been answered and I understand these instructions. I will adhere to these goals and the provided educational materials after my discharge from the hospital.  Patient/Caregiver Signature _______________________________ Date __________  Clinician Signature _______________________________________ Date __________  Please bring this form and your medication list with you to all your follow-up doctor's appointments.

## 2021-12-19 NOTE — Progress Notes (Signed)
Patient alert and oriented, spouse at bedside. Wife informed Probation officer that equipment should be delivered home before patient discharge date. Wife informed Probation officer that patient still waiting to be informed when equipment should arrive by [facility]. Writer made charge nurse aware and message left for follow up. Patient requested to try prune juice before receiving  enema. Patient  and spouse informed Probation officer that decision would be made and next shift will be made aware whether prn medications would be used to produce a bowel movement.  Patient and spouse voices an understanding.Will continue to monitor.

## 2021-12-19 NOTE — Progress Notes (Signed)
While ambulating using front wheel walker to the bathroom with assistance from wife, patient's knees got weak, wife held patient and patient also held unto walker for support.Upon entering room patient knees were bent but patient did not fall, he was assisted back into and upright position and assisted to the commode. The stedy was used to transfer patient back to bed.Patient's  wife was given permission from day shift to take patient to bathroom but told wife to call for assistance. Systolic blood pressure post was  in the 150s.

## 2021-12-19 NOTE — Plan of Care (Signed)
Downgraded to CGA overall for safety concerns  Problem: RH Balance Goal: LTG Patient will maintain dynamic standing balance (PT) Description: LTG:  Patient will maintain dynamic standing balance with assistance during mobility activities (PT) Flowsheets (Taken 12/19/2021 1359) LTG: Pt will maintain dynamic standing balance during mobility activities with:: Minimal Assistance - Patient > 75%   Problem: RH Bed to Chair Transfers Goal: LTG Patient will perform bed/chair transfers w/assist (PT) Description: LTG: Patient will perform bed to chair transfers with assistance (PT). Flowsheets (Taken 12/19/2021 1359) LTG: Pt will perform Bed to Chair Transfers with assistance level: Contact Guard/Touching assist   Problem: RH Car Transfers Goal: LTG Patient will perform car transfers with assist (PT) Description: LTG: Patient will perform car transfers with assistance (PT). Flowsheets (Taken 12/19/2021 1359) LTG: Pt will perform car transfers with assist:: Contact Guard/Touching assist   Problem: RH Ambulation Goal: LTG Patient will ambulate in controlled environment (PT) Description: LTG: Patient will ambulate in a controlled environment, # of feet with assistance (PT). Flowsheets (Taken 12/19/2021 1359) LTG: Pt will ambulate in controlled environ  assist needed:: Contact Guard/Touching assist LTG: Ambulation distance in controlled environment: 150' Goal: LTG Patient will ambulate in home environment (PT) Description: LTG: Patient will ambulate in home environment, # of feet with assistance (PT). Flowsheets (Taken 12/19/2021 1359) LTG: Pt will ambulate in home environ  assist needed:: Contact Guard/Touching assist LTG: Ambulation distance in home environment: 50'   Problem: RH Stairs Goal: LTG Patient will ambulate up and down stairs w/assist (PT) Description: LTG: Patient will ambulate up and down # of stairs with assistance (PT) Flowsheets (Taken 12/19/2021 1359) LTG: Pt will ambulate up/down stairs  assist needed:: Minimal Assistance - Patient > 75% LTG: Pt will  ambulate up and down number of stairs: 2 with R hand rail

## 2021-12-19 NOTE — Progress Notes (Signed)
Occupational Therapy Session Note  Patient Details  Name: [place] H Whetstine MRN: [id number] Date of Birth: 1926/10/02  Today's Date: 12/19/2021 OT Individual Time: 1400-1445 OT Individual Time Calculation (min): 45 min    Short Term Goals: Week 2:   STG = LTG (due to ELOS)  Skilled Therapeutic Interventions/Progress Updates:    Patient agreeable to participate in OT session. Reports 0/10 pain level. Pt agreeable to participate in therapy session. Plan for session is family/pt training to prepare for discharge on 8/9. Wife present and reports that she is ready to assist patient at home and they have completed training on shower transfers, toileting and toilet transfer. Wife feels confident with providing the necessary self care assist. Therapist provided education on proper use of gait belt in order to assist with balance in case pt needs assist. Wife verbalized understanding.  Pt participated in standing dynamic balance activity in [address] while standing at window. CGA provided for balance with VC for form and technique during sit <> stand transition. Activity utilized squigz to work on functional reaching, activity tolerance, endurance, direction following, visual scanning, visual motor, and UE stability. Pt  completed all functional mobility tasks and functional transfers with CGA and RW. No LOB noted during tasks. Fatigue level monitored during session.     Therapy Documentation Precautions:  Precautions Precautions: Fall Precaution Comments: monitor BP (orthostatic hypotension), minimal impulsivity Restrictions Weight Bearing Restrictions: No   Therapy/Group: Individual Therapy  Ailene Ravel, OTR/L,CBIS  Supplemental OT - [facility]  12/19/2021, 7:57 AM

## 2021-12-19 NOTE — Progress Notes (Signed)
Occupational Therapy Session Note  Patient Details  Name: Patrick [name] MRN: [id number] Date of Birth: 11/21/1926  Today's Date: 12/19/2021 OT Individual Time: 0952-1050 OT Individual Time Calculation (min): 58 min    Short Term Goals: Week 1:  OT Short Term Goal 1 (Week 1): Pt will complete grooming tasks with CGA standing at the sink with use of LRAD. OT Short Term Goal 1 - Progress (Week 1): Met OT Short Term Goal 2 (Week 1): Pt will complete UB dressing with supervision while seated. OT Short Term Goal 2 - Progress (Week 1): Met OT Short Term Goal 3 (Week 1): Pt will complete LB dressing using AE and LRAD PRN. OT Short Term Goal 3 - Progress (Week 1): Partly met OT Short Term Goal 4 (Week 1): Pt will complete toilet transfer with CGA using LRAD. OT Short Term Goal 4 - Progress (Week 1): Met OT Short Term Goal 5 (Week 1): Pt will complete toileting tasks with CGA with use of LRAD PRN. OT Short Term Goal 5 - Progress (Week 1): Met  Skilled Therapeutic Interventions/Progress Updates:    Patient received supine in bed - indicates wife is out "looking for a bed, or renting a bed for home."   Patient agreeable to shower.  Declined need to void.  Patient able to walk to bathroom with RW and contact guard with intermittent cueing to stay within walker.  Patient able to shower himself with supervision -at times distant supervision.  Patient able to dress himself - wife laid out clothes prior.  Patient even able to don thigh high ted hose this morning.  Patient shaved in less time than last week.  Patient given step by step instructions for next task - but feel in his own environment this may be less necessary.  Patient is anticipating discharge "soon"  He is on track for Wed discharge.   Patient returned to bed per his choice for 45 min break before PT session.  Bed alarm engaged, call bell in reach.    Therapy Documentation Precautions:  Precautions Precautions: Fall Precaution Comments:  monitor BP (orthostatic hypotension), minimal impulsivity Restrictions Weight Bearing Restrictions: No  Pain: Denies pain      Therapy/Group: Individual Therapy  ,  M 12/19/2021, 1:07 PM 

## 2021-12-19 NOTE — Progress Notes (Signed)
PROGRESS NOTE   Subjective/Complaints: NO concerns this am. Wife at bedside.  Has not had a BM in several days.    ROS:  Pt denies SOB, abd pain, CP, N/V/D, and vision changes. + constipation Objective:   No results found. Recent Labs    12/19/21 0740  WBC 5.7  HGB 13.0  HCT 39.5  PLT 117*    Recent Labs    12/19/21 0740  NA 141  K 4.3  CL 107  CO2 25  GLUCOSE 160*  BUN 15  CREATININE 1.17  CALCIUM 8.7*     Intake/Output Summary (Last 24 hours) at 12/19/2021 1354 Last data filed at 12/19/2021 1246 Gross per 24 hour  Intake 659 ml  Output 900 ml  Net -241 ml         Physical Exam: Vital Signs Blood pressure (!) 166/78, pulse 85, temperature 98.2 F (36.8 C), temperature source Oral, resp. rate 18, height 6' (1.829 m), weight 65 kg, SpO2 98 %.    General: awake, alert, appropriate, slowed responses; supine in bed; wife at bedside; NAD HENT: conjugate gaze; oropharynx dry- tongue furrowed, reddened and mild thrush noted CV: regular rate; no JVD Pulmonary: CTA B/L; no W/R/R- good air movement GI: soft, NT, ND,  can feel stool down low- hypoactive BS Psychiatric: appropriate; flat but interactive Neurological: alert Neurologic: Cranial nerves II through XII intact, motor strength is 5/5 in bilateral deltoid, bicep, tricep, grip, hip flexor, knee extensors, ankle dorsiflexor and plantar flexor Sensory exam normal sensation to light touch and proprioception in bilateral upper and lower extremities Cerebellar exam normal finger to nose to finger as well as heel to shin in bilateral upper and lower extremities Musculoskeletal: Full range of motion in all 4 extremities. No joint swelling or redness noted    Assessment/Plan: 1. Functional deficits which require 3+ hours per day of interdisciplinary therapy in a comprehensive inpatient rehab setting. Physiatrist is providing close team supervision and 24  hour management of active medical problems listed below. Physiatrist and rehab team continue to assess barriers to discharge/monitor patient progress toward functional and medical goals  Care Tool:  Bathing    Body parts bathed by patient: Right arm, Left arm, Chest, Abdomen, Front perineal area, Right upper leg, Face, Left lower leg, Right lower leg, Left upper leg, Buttocks   Body parts bathed by helper: Buttocks     Bathing assist Assist Level: Supervision/Verbal cueing     Upper Body Dressing/Undressing Upper body dressing   What is the patient wearing?: Pull over shirt    Upper body assist Assist Level: Independent    Lower Body Dressing/Undressing Lower body dressing      What is the patient wearing?: Underwear/pull up, Pants     Lower body assist Assist for lower body dressing: Supervision/Verbal cueing     Toileting Toileting Toileting Activity did not occur (Clothing management and hygiene only): N/A (no void or bm)  Toileting assist Assist for toileting: Minimal Assistance - Patient > 75%     Transfers Chair/bed transfer  Transfers assist  Chair/bed transfer activity did not occur: Safety/medical concerns  Chair/bed transfer assist level: Contact Guard/Touching assist  Locomotion Ambulation   Ambulation assist      Assist level: Minimal Assistance - Patient > 75% Assistive device: Walker-rolling Max distance: 150   Walk 10 feet activity   Assist     Assist level: Minimal Assistance - Patient > 75% Assistive device: Walker-rolling   Walk 50 feet activity   Assist    Assist level: Minimal Assistance - Patient > 75% Assistive device: Walker-rolling    Walk 150 feet activity   Assist    Assist level: Minimal Assistance - Patient > 75% Assistive device: Walker-rolling    Walk 10 feet on uneven surface  activity   Assist     Assist level: Minimal Assistance - Patient > 75%     Wheelchair     Assist Is the  patient using a wheelchair?: No             Wheelchair 50 feet with 2 turns activity    Assist            Wheelchair 150 feet activity     Assist          Blood pressure (!) 166/78, pulse 85, temperature 98.2 F (36.8 C), temperature source Oral, resp. rate 18, height 6' (1.829 m), weight 65 kg, SpO2 98 %.  Medical Problem List and Plan: 1. 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  Antithrombotics: -DVT/anticoagulation:  Mechanical: Sequential compression devices, below knee Bilateral lower extremities             -antiplatelet therapy: N/A 3. Spinal pain: On depakote for neuropathy (per chart review).             --Used oxycodone 10/325 mg 2-3 times a day.             -scheduling oxycodone at night 4. Mood/Behavior/Sleep: LCSW to follow for evaluation and support.              --Schedule oxycodone 5/325 mg/Hs for pain/sleep.              -antipsychotic agents: N/A  -Melatonin '3mg'$  started 8/2 for sleep  -He was started on Buspar '15mg'$  '5mg'$  BID on 8/2 to help with anxiety  -Will stop melatonin 8/4  5. Neuropsych/cognition: This patient is not capable of making decisions on his own behalf. 6. Skin/Wound Care: Routine pressure relief measures.  7. Fluids/Electrolytes/Nutrition: Monitor I/O. Encourage fluid intake.  8. HTN w/significant orthostatic changes: Monitor BP TID w/orthostatic vitals             --orthostatic changes have been ongoing for years per records --Continue Proscar, change to bedtime. Change florinef (added 07/28) to 7 am prior to therapy. --Encourage fluid intake.  Binder/TEDs daily when out of bed.  -8/1 he continues to have orthostatic hypotension, however improved overall from prior 8/5- will change Proscar to Flomax since orthostatic hypotension better per pt- will schedule at night 9. CKD?: Will check CMET in am.   -Scr 1.17 8/7, continue to encourage fluid intake 10. Mild ITP/Thrombocytopenia:  stable and mild     Latest Ref Rng & Units 12/19/2021    7:40 AM 12/12/2021    5:19 AM 12/10/2021    5:18 AM  CBC  WBC  4.0 - 10.5 K/uL 5.7  7.4  8.7   Hemoglobin 13.0 - 17.0 g/dL 13.0  13.6  13.3   Hematocrit 39.0 - 52.0 % 39.5  40.6  39.8   Platelets 150 - 400 K/uL 117  87  89                 --Monitor for signs of bleeding.  11. CAD: Monitor for symptoms with increase in activity. Has been on florinef in the past.   12. H/o Food impaction/gastritis: Intake has been poor.  --Will order supervision for safety with meals/to assist w/meals.  13. Urgency/Dysuria: UC + for staph hemolytica- continue keflex for 5 day course-last dose 8/2 -Lidocaine Jelly and coude if IC needed -Will decrease frequency of PVR -On proscar at bedtime, medication options limited by orthostatic hypotension 8/3 has been continent   8/5- required peeing many times at night- will try low dose Flomax tonight- but change if not hypotensive tonight/tomorrow 14. Fatigue: Vitamin D level 60, start 1,000U daily supplement.  15. Tachycardia: could be secondary to fludricortisone but this is helping BP, continue to monitor HR TID  -8/7 HR stable at 85 16. Transient increased in right sided weakness: CT head obtained and negative for acute pathology, We discussed initial location of bleed , as well as subsequent CT findings  17. Constipation  -8/1 Schedule miralax daily  -8/4 sorbitol '60mg'$  ordered  8/5- still no BM- will give more sorbitol and soap suds enema.   8/7 order soap suds enema today 18. Moderate malnutrition  -Dietician started Magic cup, MVI, Ensure max  19, Thrush/decreased appetite/food "tastes weird"  8/5- will start nystatin Swish and swallow QID   LOS: 10 days A FACE TO FACE EVALUATION WAS PERFORMED  Jennye Boroughs 12/19/2021, 1:54 PM

## 2021-12-19 NOTE — Progress Notes (Signed)
Patient and spouse requested to have fleet enema first  at bedtime and  if not effective will try soap suds enema. Will made on coming nurse aware.

## 2021-12-19 NOTE — Progress Notes (Signed)
Physical Therapy Session Note  Patient Details  Name: Patrick [name] MRN: [id number] Date of Birth: Sep 20, 1926  Today's Date: 12/19/2021 PT Individual Time: 4239-5320 + 1300-1358 PT Individual Time Calculation (min): 41 min  + 58 min  Short Term Goals: Week 1:  PT Short Term Goal 1 (Week 1): Pt will perform bed mobility with CGA. PT Short Term Goal 2 (Week 1): Pt will completes bed to chair transfer consistently with CGA. PT Short Term Goal 3 (Week 1): Pt will ambulate x150' with CGA and LRAD. PT Short Term Goal 4 (Week 1): Pt will complete x2 steps with R hand rail and CGA  Skilled Therapeutic Interventions/Progress Updates:      1st session: Pt lying in bed to start - agreeable to therapy and denies pain. Fully dressed with shoes on. Supine<>sitting EOB with supervision. Pt's personal wheelchair has been delivered to room. Adjusted leg rests to fit and applied strap for back rest to keep stable. Pt completed squat<>pivot transfer with CGA from EOB to w/c. Transported to main rehab gym for time. Focused remainder of session on standing balance and gait training. Ambulated with RW and CGA 163f + 1539fduring session with cues needed for slowing down pace, and increasing proximity to RW. Needs safety cues for turning with RW management. Balance training on Blue Air-ex foam pad with CGA and intermittent minA - feet apart and feet together with eyes open for both. Able to uprgrade task with adding layer of UE mobility with upward reaching and outward reaching with unweighted ball - miNA needed for upgraded task due to posterior lean and inadequate ankle/hip strategies to maintain upright. Pt asking about why he's in the hospital, how long he's been here, and his overall prognosis. Reviewed length of stay, reason for admission, progress made in therapy, and showed him images of his CT head that demonstrated the small bleed from his head injury when he fell. Pt with baseline STM deficits and repeated  his questions later on during session, no carryover of education. Pt returned to room and concluded session in bed. All needs met, wife at bedside.     2nd session: Pt supine in bed to start, agreeable to therapy. Wife at bedside. Pt denies pain. Supine<>sitting EOB without assist. Squat<>pivot transfer to w/c with CGA from EOB - cues for sequencing and setup. Transported in w/c to day room rehab gym for time. Continued to focus on gait training and dynamic standing balance. Played games of cornhole toss with board ~1560fway - completed without UE support and CGA/minA with pt demonstrating stepping technique toss - narrow BOS resulting in minor LOB during tossing but he enjoyed the competitiveness of this activity. Gait training with no AD to challenge balance and stability, ambulating [id number] minA. Short stride length with inconsistent step length, narrow BOS with trunk flexed. Gait training with RW and 2.5# ankle weights bilaterally to challenge endurance and strengthening - 165ft 22f5ft w52fRW and minA. Pt reporting he feels better with weights on as he can feel where his feet are being placed during ambulation. Concluded session supine in bed with all needs met, bed alarm on.   Therapy Documentation Precautions:  Precautions Precautions: Fall Precaution Comments: monitor BP (orthostatic hypotension), minimal impulsivity Restrictions Weight Bearing Restrictions: No General:     Therapy/Group: Individual Therapy  Patrick Simons23, 7:38 AM

## 2021-12-20 MED ORDER — ADULT MULTIVITAMIN W/MINERALS CH: 1.0000 | ORAL_TABLET | Freq: Every day | ORAL | Status: DC

## 2021-12-20 MED ORDER — POLYETHYLENE GLYCOL 3350 17 G PO PACK
17.0000 g | PACK | Freq: Two times a day (BID) | ORAL | Status: DC
  Administered 2021-12-20 – 2021-12-22 (×4): 17 g via ORAL
  Filled 2021-12-20 (×4): qty 1

## 2021-12-20 MED ORDER — MEMANTINE HCL 10 MG PO TABS: 10.0000 mg | ORAL_TABLET | Freq: Every day | ORAL | Status: DC

## 2021-12-20 MED ORDER — VITAMIN D3 25 MCG PO TABS: 1000.0000 [IU] | ORAL_TABLET | Freq: Every day | ORAL | Status: DC

## 2021-12-20 NOTE — Progress Notes (Signed)
Patient ID: Patrick [name], male   DOB: Feb 08, 1927, 88 y.o.   MRN: [id number]  Pt had BP issues last night, MD has decided to extend him until Thursday 8/10 to allow the flomax to get out of his system. Will update team and pt and wife in agreement with.

## 2021-12-20 NOTE — Progress Notes (Signed)
Occupational Therapy Session Note  Patient Details  Name: [place] H Tokarski MRN: [id number] Date of Birth: June 11, 1926  Today's Date: 12/20/2021 OT Individual Time: 1400-1453 OT Individual Time Calculation (min): 53 min    Short Term Goals: Week 2:   STG=LTG  Skilled Therapeutic Interventions/Progress Updates:    Patient received supine in bed.  Patient agreeable to OT session. Wife at bedside.  Discussion regarding patient's BP appearing stable with abdominal binder and thigh high TED hose.  Patient transported to gym.  9 hole peg test R 40.75  L 34.16, Dynamometer Testing:  R 54LB  L 58 LB.   Worked on handwriting - patient takes pride in his signature.  Initially he was displeased with signature.  With subsequent attempts - more legible and more recognizable to patient.  BP stable seated 159/63 - walked about 100 feet with minimal change to BP.  Wife in room, offered time for questions relating to discharge.   Patient left in bed with HOB elevated and bed alarm activated.  Wife at bedside.    Therapy Documentation Precautions:  Precautions Precautions: Fall Precaution Comments: monitor BP (orthostatic hypotension), minimal impulsivity Restrictions Weight Bearing Restrictions: No   Pain:  Denies pain      Therapy/Group: Individual Therapy  Mariah Milling 12/20/2021, 2:54 PM

## 2021-12-20 NOTE — Progress Notes (Signed)
Physical Therapy Weekly Progress Note  Patient Details  Name: Patrick [name] MRN: [id number] Date of Birth: 08-15-1926  Beginning of progress report period: December 10, 2021 End of progress report period: December 20, 2021  Today's Date: 12/20/2021 PT Individual Time: 1100-1200 PT Individual Time Calculation (min): 60 min   Patient has met 4 of 4 short term goals. Patrick [name] has met all of his short term goals since initial evaluation. Patient is able to complete bed mobility with supervision, transfers and gait with rolling walker and CGA for safety. Patient's static and dynamic balance has improved significantly since initial evaluation, however continues to demonstrate poor carryover with safety awareness requiring verbal cues for safety and proper sequencing with tasks. Patient is scheduled to discharge home on Thursday, 8/10, with his spouse who will provide 24/7 supervision. Patient has personal wheelchair in his room and a rolling walker and hospital bed at home.   Patient continues to demonstrate the following deficits muscle weakness, decreased cardiorespiratoy endurance, decreased safety awareness and decreased memory, and decreased standing balance and decreased balance strategies and therefore will continue to benefit from skilled PT intervention to increase functional independence with mobility.  Patient progressing toward long term goals..  Continue plan of care.  PT Short Term Goals Week 1:  PT Short Term Goal 1 (Week 1): Pt will perform bed mobility with CGA. PT Short Term Goal 1 - Progress (Week 1): Met PT Short Term Goal 2 (Week 1): Pt will completes bed to chair transfer consistently with CGA. PT Short Term Goal 2 - Progress (Week 1): Met PT Short Term Goal 3 (Week 1): Pt will ambulate x150' with CGA and LRAD. PT Short Term Goal 3 - Progress (Week 1): Met PT Short Term Goal 4 (Week 1): Pt will complete x2 steps with R hand rail and CGA PT Short Term Goal 4 - Progress (Week 1):  Met Week 2:  PT Short Term Goal 1 (Week 2): STG=LTG due to ELOS  Skilled Therapeutic Interventions/Progress Updates:  Patient greeted sitting upright in wheelchair in room and transitioned from OT care- [name] present and both agreeable to PT treatment session. OT reporting patient's BP dropped to 80s/50s while in standing during her session- Abdominal binder and ted hose donned at end of their treatment session. At this time, patient's discharge date has been pushed out to Thursday, 8/10, secondary to BP issues from medication- All staff aware. Patient transported to rehab gym via wheelchair due to reports of BP issues.   Once in rehab gym, BP and HR assessed as follows (patient asymptomatic for all)-  Sitting in wheelchair: 125/68, HR 89 Standing: 105/56, HR 98 Post Gait trial #1: 138/65, HR 91 Post standing activity: 130/65, HR 90  Patient gait trained 2 x 150' with rolling walker and CGA for safety- Second therapist present with wheelchair due to concerns regarding BP. During initial gait trial patient required moderate verbal cues for keeping the rolling walker in contact with the floor, especially during turns. At times patient playfully picking the walker off the floor requiring minimal assistance for steadying. Patient with increased cadence and required cues for staying within the frame of the walker. During second gait trial patient demonstrated improved rolling walker management and safety awareness. Only minimal cues required toward end of gait trial for decreased cadence and increased step height secondary to fatigue. Patient required extended seated rest break due to fatigue- During this time patient provided with water and educated on how drinking water can  assist with BP regulation.   Patient stood without UE support while reaching across his body with RUE for bean bags on a tray table and then tossing them at a corn hole board with GCA for safety. Patient completed x11 tosses  prior to requiring a seated rest break. Activity performed in order to improve dynamic standing balance and overall activity endurance.   Patient stood on airex foam with rolling walker for UE support as needed while tossing a ball with 2nd therapist and CGA/minimal assistance for improved stability. Patient with x1 moderate LOB anteriorly requiring steadying assistance from therapist. Patient completed x20 tosses back and forth.   Throughout session, patient completed various sit/stands and transfers with rolling walker and CGA for safety- Verbal/tactile cues for proper hand placement prior to sitting/standing with inconsistent carryover.   Patient returned to his room sitting upright in wheelchair with wife present and all needs within reach.    Therapy Documentation Precautions:  Precautions Precautions: Fall Precaution Comments: monitor BP (orthostatic hypotension), minimal impulsivity Restrictions Weight Bearing Restrictions: No    Therapy/Group: Individual Therapy  Patrick [name] 12/20/2021, 12:09 PM

## 2021-12-20 NOTE — Progress Notes (Signed)
PROGRESS NOTE   Subjective/Complaints: Hypotensive again this morning, discussed could be related to starting flomax again as he is experiencing similar symptoms to last time.    ROS:  Pt denies SOB, abd pain, CP, N/V/D, and vision changes. + constipation, +orthostatic hypotension Objective:   No results found. Recent Labs    12/19/21 0740  WBC 5.7  HGB 13.0  HCT 39.5  PLT 117*    Recent Labs    12/19/21 0740  NA 141  K 4.3  CL 107  CO2 25  GLUCOSE 160*  BUN 15  CREATININE 1.17  CALCIUM 8.7*     Intake/Output Summary (Last 24 hours) at 12/20/2021 1039 Last data filed at 12/20/2021 0400 Gross per 24 hour  Intake 444 ml  Output 250 ml  Net 194 ml        Physical Exam: Vital Signs Blood pressure 134/69, pulse 82, temperature 98.3 F (36.8 C), temperature source Oral, resp. rate 16, height 6' (1.829 m), weight 65.8 kg, SpO2 95 %.  Gen: no distress, normal appearing HEENT: oral mucosa pink and moist, NCAT Cardio: Reg rate Chest: normal effort, normal rate of breathing Abd: soft, non-distended Ext: no edema Psychiatric: appropriate; flat but interactive Neurological: alert Neurologic: Cranial nerves II through XII intact, motor strength is 5/5 in bilateral deltoid, bicep, tricep, grip, hip flexor, knee extensors, ankle dorsiflexor and plantar flexor Sensory exam normal sensation to light touch and proprioception in bilateral upper and lower extremities Cerebellar exam normal finger to nose to finger as well as heel to shin in bilateral upper and lower extremities Musculoskeletal: Full range of motion in all 4 extremities. No joint swelling or redness noted    Assessment/Plan: 1. Functional deficits which require 3+ hours per day of interdisciplinary therapy in a comprehensive inpatient rehab setting. Physiatrist is providing close team supervision and 24 hour management of active medical problems listed  below. Physiatrist and rehab team continue to assess barriers to discharge/monitor patient progress toward functional and medical goals  Care Tool:  Bathing    Body parts bathed by patient: Right arm, Left arm, Chest, Abdomen, Front perineal area, Right upper leg, Face, Left lower leg, Right lower leg, Left upper leg, Buttocks   Body parts bathed by helper: Buttocks     Bathing assist Assist Level: Supervision/Verbal cueing     Upper Body Dressing/Undressing Upper body dressing   What is the patient wearing?: Pull over shirt    Upper body assist Assist Level: Independent    Lower Body Dressing/Undressing Lower body dressing      What is the patient wearing?: Underwear/pull up, Pants     Lower body assist Assist for lower body dressing: Supervision/Verbal cueing     Toileting Toileting Toileting Activity did not occur (Clothing management and hygiene only): N/A (no void or bm)  Toileting assist Assist for toileting: Minimal Assistance - Patient > 75%     Transfers Chair/bed transfer  Transfers assist  Chair/bed transfer activity did not occur: Safety/medical concerns  Chair/bed transfer assist level: Contact Guard/Touching assist     Locomotion Ambulation   Ambulation assist      Assist level: Minimal Assistance - Patient > 75%  Assistive device: Walker-rolling Max distance: 150   Walk 10 feet activity   Assist     Assist level: Minimal Assistance - Patient > 75% Assistive device: Walker-rolling   Walk 50 feet activity   Assist    Assist level: Minimal Assistance - Patient > 75% Assistive device: Walker-rolling    Walk 150 feet activity   Assist    Assist level: Minimal Assistance - Patient > 75% Assistive device: Walker-rolling    Walk 10 feet on uneven surface  activity   Assist     Assist level: Minimal Assistance - Patient > 75%     Wheelchair     Assist Is the patient using a wheelchair?: No              Wheelchair 50 feet with 2 turns activity    Assist            Wheelchair 150 feet activity     Assist          Blood pressure 134/69, pulse 82, temperature 98.3 F (36.8 C), temperature source Oral, resp. rate 16, height 6' (1.829 m), weight 65.8 kg, SpO2 95 %.  Medical Problem List and Plan: 1. Functional deficits secondary to traumatic ICH             -patient may shower             -ELOS/Goals: 8/9, CGA/ min A             -Continue CIR- PT, OT and SLP  Extend to Thursday given hypotension 2.  Antithrombotics: -DVT/anticoagulation:  Mechanical: Sequential compression devices, below knee Bilateral lower extremities             -antiplatelet therapy: N/A 3. Spinal pain: On depakote for neuropathy (per chart review).             --Used oxycodone 10/325 mg 2-3 times a day.             -scheduling oxycodone at night 4. Mood/Behavior/Sleep: LCSW to follow for evaluation and support.              --Schedule oxycodone 5/325 mg/Hs for pain/sleep.              -antipsychotic agents: N/A  -Melatonin '3mg'$  started 8/2 for sleep  -He was started on Buspar '15mg'$  '5mg'$  BID on 8/2 to help with anxiety  -Will stop melatonin 8/4  5. Neuropsych/cognition: This patient is not capable of making decisions on his own behalf. 6. Skin/Wound Care: Routine pressure relief measures.  7. Fluids/Electrolytes/Nutrition: Monitor I/O. Encourage fluid intake.  8. HTN w/significant orthostatic changes: Monitor BP TID w/orthostatic vitals             --orthostatic changes have been ongoing for years per records --Continue Proscar, change to bedtime. Change florinef (added 07/28) to 7 am prior to therapy. --Encourage fluid intake.  Binder/TEDs daily when out of bed.  -8/1 he continues to have orthostatic hypotension, however improved overall from prior 8/5- will change Proscar to Flomax since orthostatic hypotension better per pt- will schedule at night 8/8: d/c flomax given hypotension- added  flomax as severe allergy 9. CKD?: Will check CMET in am.  -Scr 1.17 8/7, continue to encourage fluid intake 10. Mild ITP/Thrombocytopenia:  stable and mild     Latest Ref Rng & Units 12/19/2021    7:40 AM 12/12/2021    5:19 AM 12/10/2021    5:18 AM  CBC  WBC 4.0 - 10.5 K/uL 5.7  7.4  8.7   Hemoglobin 13.0 - 17.0 g/dL 13.0  13.6  13.3   Hematocrit 39.0 - 52.0 % 39.5  40.6  39.8   Platelets 150 - 400 K/uL 117  87  89                 --Monitor for signs of bleeding.  11. CAD: Monitor for symptoms with increase in activity. Has been on florinef in the past.   12. H/o Food impaction/gastritis: Intake has been poor.  --Will order supervision for safety with meals/to assist w/meals.  13. Urgency/Dysuria: UC + for staph hemolytica- continue keflex for 5 day course-last dose 8/2 -Lidocaine Jelly and coude if IC needed -Will decrease frequency of PVR -On proscar at bedtime, medication options limited by orthostatic hypotension 8/3 has been continent   8/5- required peeing many times at night- will try low dose Flomax tonight- but change if not hypotensive tonight/tomorrow 14. Fatigue: Vitamin D level 60, start 1,000U daily supplement.  15. Tachycardia: could be secondary to fludricortisone but this is helping BP, continue to monitor HR TID  -8/7 HR stable at 85 16. Transient increased in right sided weakness: CT head obtained and negative for acute pathology, We discussed initial location of bleed , as well as subsequent CT findings  17. Constipation  -8/1 Schedule miralax daily  -8/4 sorbitol '60mg'$  ordered  8/5- still no BM- will give more sorbitol and soap suds enema.   8/7 order soap suds enema today 18. Moderate malnutrition  -continue Magic cup, MVI, Ensure max  19, Thrush/decreased appetite/food "tastes weird": discussed with wife that he can stop nystatin once thrush resolves.     LOS: 11 days A FACE TO FACE EVALUATION WAS PERFORMED  Clide Deutscher Natashia Roseman 12/20/2021, 10:39 AM

## 2021-12-20 NOTE — Progress Notes (Signed)
Occupational Therapy Session Note  Patient Details  Name: Patrick [name] MRN: [id number] Date of Birth: [birth year]  Today's Date: 12/20/2021 OT Individual Time: 0950-1100 OT Individual Time Calculation (min): 70 min    Short Term Goals: Week 2:   STG=LTG  Skilled Therapeutic Interventions/Progress Updates:    Patient received sidelying in bed.  [name] at bedside, upset about patient's status since yesterday.  Patient with unstable BP.  Wife spoke with MD and plan to extend stay to allow BP to stabilize.  Attributing BP change to medication change recently.  Patient agreeable to get up out of bed.  BP taken in supine, seated and standing see below.  Patient stable when seated so wheeled into bathroom and remained seated for shower.  Limited opportunities for standing today based on BP.  No significant change in functional status as related to bathing and dressing.  Provided assistance for donning teds this morning.  Abdominal binder and thigh high teds applied.  Hand off to PT for next session.   Therapy Documentation Precautions:  Precautions Precautions: Fall Precaution Comments: monitor BP (orthostatic hypotension), minimal impulsivity Restrictions Weight Bearing Restrictions: No   Vital Signs: Supine:  151/69 Seated:  119/65 Standing:  93/49  Seated after shower:  125/65  Pain: Pain Assessment Pain Scale: 0-10 Pain Score: Pain in mid back right - not scored    Therapy/Group: Individual Therapy  Mariah Milling 12/20/2021, 12:13 PM

## 2021-12-21 DIAGNOSIS — N398 Other specified disorders of urinary system: Secondary | ICD-10-CM

## 2021-12-21 DIAGNOSIS — N390 Urinary tract infection, site not specified: Secondary | ICD-10-CM

## 2021-12-21 DIAGNOSIS — K5900 Constipation, unspecified: Secondary | ICD-10-CM

## 2021-12-21 DIAGNOSIS — F064 Anxiety disorder due to known physiological condition: Secondary | ICD-10-CM

## 2021-12-21 MED ORDER — LEVOTHYROXINE SODIUM 112 MCG PO TABS
112.0000 ug | ORAL_TABLET | Freq: Every day | ORAL | 0 refills | Status: DC
  Filled 2021-12-21: qty 30, 30d supply, fill #0

## 2021-12-21 MED ORDER — PANTOPRAZOLE SODIUM 40 MG PO TBEC
40.0000 mg | DELAYED_RELEASE_TABLET | Freq: Every day | ORAL | 0 refills | Status: DC
  Filled 2021-12-21: qty 30, 30d supply, fill #0

## 2021-12-21 MED ORDER — FLUDROCORTISONE ACETATE 0.1 MG PO TABS
0.0500 mg | ORAL_TABLET | Freq: Every day | ORAL | 0 refills | Status: DC
  Filled 2021-12-21: qty 15, 30d supply, fill #0

## 2021-12-21 MED ORDER — VITAMIN D3 25 MCG PO TABS
1000.0000 [IU] | ORAL_TABLET | Freq: Every day | ORAL | 0 refills | Status: DC
  Filled 2021-12-21: qty 60, 60d supply, fill #0

## 2021-12-21 MED ORDER — MIRTAZAPINE 15 MG PO TABS
15.0000 mg | ORAL_TABLET | Freq: Every day | ORAL | 0 refills | Status: DC
  Filled 2021-12-21: qty 30, 30d supply, fill #0

## 2021-12-21 MED ORDER — DIVALPROEX SODIUM 250 MG PO DR TAB
250.0000 mg | DELAYED_RELEASE_TABLET | Freq: Two times a day (BID) | ORAL | 0 refills | Status: DC
  Filled 2021-12-21: qty 60, 30d supply, fill #0

## 2021-12-21 MED ORDER — EZETIMIBE 10 MG PO TABS
10.0000 mg | ORAL_TABLET | Freq: Every day | ORAL | 0 refills | Status: DC
  Filled 2021-12-21: qty 30, 30d supply, fill #0

## 2021-12-21 MED ORDER — NYSTATIN 100000 UNIT/ML MT SUSP
5.0000 mL | Freq: Four times a day (QID) | OROMUCOSAL | 0 refills | Status: DC
  Filled 2021-12-21: qty 60, 3d supply, fill #0

## 2021-12-21 MED ORDER — BUSPIRONE HCL 5 MG PO TABS
5.0000 mg | ORAL_TABLET | Freq: Two times a day (BID) | ORAL | 0 refills | Status: DC
  Filled 2021-12-21: qty 60, 30d supply, fill #0

## 2021-12-21 MED ORDER — POLYETHYLENE GLYCOL 3350 17 GM/SCOOP PO POWD
17.0000 g | Freq: Two times a day (BID) | ORAL | 0 refills | Status: DC
  Filled 2021-12-21: qty 238, 7d supply, fill #0

## 2021-12-21 MED ORDER — FINASTERIDE 5 MG PO TABS
5.0000 mg | ORAL_TABLET | Freq: Every day | ORAL | 0 refills | Status: DC
  Filled 2021-12-21: qty 30, 30d supply, fill #0

## 2021-12-21 MED ORDER — MEMANTINE HCL 10 MG PO TABS
10.0000 mg | ORAL_TABLET | Freq: Every day | ORAL | 0 refills | Status: DC
  Filled 2021-12-21: qty 30, 30d supply, fill #0

## 2021-12-21 NOTE — Progress Notes (Signed)
Physical Therapy Session Note  Patient Details  Name: Patrick [name] MRN: [id number] Date of Birth: [birth year]  Today's Date: 12/21/2021 PT Individual Time: 1305-1420 PT Individual Time Calculation (min): 75 min   Short Term Goals: Week 2:  PT Short Term Goal 1 (Week 2): STG=LTG due to ELOS  Skilled Therapeutic Interventions/Progress Updates:    Pt received sitting in w/c with his wife present and pt agreeable to therapy session. Pt's wife present throughout session for hands-on education.   Pt not wearing TEDs nor abdominal binder. Vitals in sitting at beginning of session: BP 143/63 (MAP 86), HR 82bpm  Vitals standing: BP 131/61 (MAP 79), HR 94bpm   Sit<>stands using RW with supervision throughout session. Therapist reinforcing recommendation that pt use RW for all standing and ambulating at home several times throughout session. Pt ambulated ~263f to ortho gym using RW with CGA - with fatigue demonstrates gradual increasing anterior trunk lean and pushing AD too far forward with decreased R LE foot clearance.   Simulated ambulatory car transfer using RW with CGA for safety/steadying - therapist demonstrated and had pt perform with safe technique of turning to sit prior to placing LEs in/out of vehicle with pt performing correctly; however, a few minutes later when pt repeated same task he had no recall of what was just instructed and performed side step into the vehicle. Therapist reinforced recommended technique and pt's wife verbalized understanding.   Pt ambulated ~172fup/down ramp using RW with pt's wife providing CGA for safety.  Pt's wife expresses concern that pt will require her repeated cuing to ensure he is safe at home because of his impaired safety awareness and poor awareness of deficits. Therapist reinforced that the pt will need her to cue him for these things.   Pt performed 4 stair navigation (6" height) using R HR only to simulate home environment with pt's wife  providing CGA for safety and therapist providing cuing for proper technique/assistance - pt/wife demonstrate proper technique and both report no concerns.   Pt's wife reports feeling comfortable with the tasks educated on and states no additional hands-on practice needed at this time.   [facility] demonstrates increased fall risk as noted by score of  26/56; however, this is a significant improvement compared to 7/56 on 12/13/21. (<36= high risk for falls, close to 100%; 37-45 significant >80%; 46-51 moderate >50%; 52-55 lower >25%).  Pt ambulated back to room as described above.   Therapist educated pt's wife on how to manage wheelchair parts (brakes, armrests, foot rests) and how to fold up the wheelchair - pt's wife demonstrates understanding.   At end of session, pt left supine in bed with needs in reach, bed alarm on, and his wife present.   Therapy Documentation Precautions:  Precautions Precautions: Fall Precaution Comments: monitor BP (orthostatic hypotension), minimal impulsivity; decreased safety awareness, generalized deconditioning/weakness Restrictions Weight Bearing Restrictions: No   Pain:  No reports of pain throughout session.  Balance: Standardized Balance Assessment Standardized Balance Assessment: Berg Balance Test Berg Balance Test Sit to Stand: Able to stand  independently using hands Standing Unsupported: Able to stand 2 minutes with supervision Sitting with Back Unsupported but Feet Supported on Floor or Stool: Able to sit safely and securely 2 minutes Stand to Sit: Sits safely with minimal use of hands Transfers: Able to transfer with verbal cueing and /or supervision Standing Unsupported with Eyes Closed: Able to stand 10 seconds with supervision Standing Ubsupported with Feet  Together: Able to place feet together independently and stand for 1 minute with supervision From Standing, Reach Forward with Outstretched Arm:  Loses balance while trying/requires external support (anterior LOB requiring assistance to prevent fall) From Standing Position, Pick up Object from Floor: Unable to try/needs assist to keep balance From Standing Position, Turn to Look Behind Over each Shoulder: Needs supervision when turning Turn 360 Degrees: Needs assistance while turning Standing Unsupported, Alternately Place Feet on Step/Stool: Able to complete >2 steps/needs minimal assist Standing Unsupported, One Foot in Front: Able to take small step independently and hold 30 seconds Standing on One Leg: Unable to try or needs assist to prevent fall Total Score: 26    Therapy/Group: Individual Therapy  Tawana Scale , PT, DPT, NCS, CSRS 12/21/2021, 12:21 PM

## 2021-12-21 NOTE — Patient Care Conference (Signed)
Inpatient RehabilitationTeam Conference and Plan of Care Update Date: 12/21/2021   Time: 4:25 PM    Patient Name: Patrick [name]      Medical Record Number: [id number]  Date of Birth: 07-Feb-1927 Sex: Male         Room/Bed: 4W13C/4W13C-01 Payor Info: Payor: MEDICARE / Plan: MEDICARE PART A AND B / Product Type: *No Product type* /    Admit Date/Time:  12/09/2021  3:40 PM  Primary Diagnosis:  Subarachnoid hematoma with loss of consciousness, sequela [facility])  Hospital Problems: Principal Problem:   Subarachnoid hematoma with loss of consciousness, sequela ([place]) Active Problems:   Malnutrition of moderate degree    Expected Discharge Date: Expected Discharge Date: 12/22/21  Team Members Present: Physician leading conference: Dr. Leeroy Cha Social Worker Present: Ovidio Kin, LCSW Nurse Present: Other (comment) Benjie Karvonen, RN) PT Present: Ginnie Smart, PT OT Present: Other (comment) Antony Salmon, OT) SLP Present: Helaine Chess, SLP PPS Coordinator present : Gunnar Fusi, SLP     Current Status/Progress Goal Weekly Team Focus  Bowel/Bladder   cont X2. Last BM-8/7  remain continent  assess toileting q shift and PRN   Swallow/Nutrition/ Hydration             ADL's             Mobility   mod I bed mobility, supervision sit<>stands to RW, CGA stand<>pivot transfers using RW. CGA gait 116f using RW. CGA 12 stairs using 2 hand rails. Balance has improved, safety awareness remains questionable due to STM deficits  Goals downgraded to CGA  DC planning, balance, activity tolerance, gait training, pt education   Communication             Safety/Cognition/ Behavioral Observations            Pain   c/o chronic pain to Lower back. Pain managment in progress, effective  pain<3  assess pain q shift and PRN   Skin   intact  remain intact  assess skin q shift and PRN     Discharge Planning:  Wife here daily to assist with participation and orientation. Family  education completed and issues stayed past DC date BP   Team Discussion: Patient admitted with SRegan Continent x 2, refusing laxatives but LBM up to date 8/7.  Wife expressing anxiety about managing TED hose and ABD binder for BP management at home. Therapy has tried mobilizing without interventions and BP has been acceptable. Thrush being treated in mouth with nystatin. No other skin issues, denies pain. Patient on target to meet rehab goals: yes  *See Care Plan and progress notes for long and short-term goals.   Revisions to Treatment Plan:  None  Teaching Needs: Family education, medication management, safety  Current Barriers to Discharge: Caregiver concern regarding BP issues  Possible Resolutions to Barriers: Family education, medication education     Medical Summary Current Status: orthostatic hypotension, underweight, thrush,  Barriers to Discharge: Neurogenic Bowel & Bladder;Weight  Barriers to Discharge Comments: orthostatic hypotension, underweight, thrush Possible Resolutions to BCelanese CorporationFocus: continue to monitor blood pressure TID, continue fludrocortisone, discontinue flomax, continue abdominal binder and TEDs, continue nystatin   Continued Need for Acute Rehabilitation Level of Care: The patient requires daily medical management by a physician with specialized training in physical medicine and rehabilitation for the following reasons: Direction of a multidisciplinary physical rehabilitation program to maximize functional independence : Yes Medical management of patient stability for increased activity during participation in an intensive rehabilitation regime.: Yes  Analysis of laboratory values and/or radiology reports with any subsequent need for medication adjustment and/or medical intervention. : Yes   I attest that I was present, lead the team conference, and concur with the assessment and plan of the team.   Barrett Shell 12/21/2021, 4:25 PM

## 2021-12-21 NOTE — Progress Notes (Signed)
Inpatient Rehabilitation Discharge Medication Review by a Pharmacist  A complete drug regimen review was completed for this patient to identify any potential clinically significant medication issues.  High Risk Drug Classes Is patient taking? Indication by Medication  Antipsychotic No   Anticoagulant No   Antibiotic Yes Nystatin- thrush  Opioid Yes Percocet - pain  Antiplatelet No   Hypoglycemics/insulin No   Vasoactive Medication No NitroSL- angina  Chemotherapy No   Other Yes Buspar- anxiety Zetia - HLD Protonix - GERD Remeron - sleep Namenda - dementia Proscar - BPH Depakote - neuropathy Florinef - hypotension Synthroid - hypothyroidism     Type of Medication Issue Identified Description of Issue Recommendation(s)  Drug Interaction(s) (clinically significant)     Duplicate Therapy     Allergy     No Medication Administration End Date     Incorrect Dose     Additional Drug Therapy Needed     Significant med changes from prior encounter (inform family/care partners about these prior to discharge).    Other       Clinically significant medication issues were identified that warrant physician communication and completion of prescribed/recommended actions by midnight of the next day:  No    Time spent performing this drug regimen review (minutes):  30  Zaccai Chavarin BS, PharmD, BCPS Clinical Pharmacist 12/22/2021 11:13 AM  Contact: [phone] after 3 PM  "Be curious, not judgmental..." -Jamal Maes

## 2021-12-21 NOTE — Discharge Summary (Signed)
Physical Therapy Discharge Summary  Patient Details  Name: Patrick [name] MRN: [id number] Date of Birth: [birth year]  Patient has met 9 of 9 long term goals due to improved activity tolerance, improved balance, improved postural control, increased strength, ability to compensate for deficits, improved attention, and improved awareness.  Patient to discharge at an ambulatory level  CGA/minA .   Patient's care partner is independent to provide the necessary physical and cognitive assistance at discharge.  Reasons goals not met: n/a  Recommendation:  Patient will benefit from ongoing skilled PT services in home health setting to continue to advance safe functional mobility, address ongoing impairments in dynamic standing balance, functional transfers, gait training, home safety, caregiver training, fall prevention, safety awareness, generalized strengthening, and minimize fall risk.  Equipment: 16x16 wheelchair, hospital bed. Pt owns RW  Reasons for discharge: treatment goals met and discharge from hospital  Patient/family agrees with progress made and goals achieved: Yes  PT Discharge Precautions/Restrictions Precautions Precautions: Fall Precaution Comments: monitor BP (orthostatic hypotension), minimal impulsivity; decreased safety awareness, generalized deconditioning/weakness Restrictions Weight Bearing Restrictions: No Vital Signs Therapy Vitals Temp: 97.8 F (36.6 C) Pulse Rate: 72 Resp: 18 BP: (!) 141/43 Patient Position (if appropriate): Lying Oxygen Therapy SpO2: 99 % O2 Device: Room Air Pain Interference Pain Interference Pain Effect on Sleep: 0. Does not apply - I have not had any pain or hurting in the past 5 days Pain Interference with Therapy Activities: 0. Does not apply - I have not received rehabilitationtherapy in the past 5 days Pain Interference with Day-to-Day Activities: 1. Rarely or not at all Vision/Perception  Vision - History Ability to See in  Adequate Light: 0 Adequate Perception Perception: Within Functional Limits Praxis Praxis: Intact  Cognition Overall Cognitive Status: History of cognitive impairments - at baseline Arousal/Alertness: Awake/alert Orientation Level: Oriented to person;Disoriented to place;Disoriented to time;Disoriented to situation Attention: Focused;Sustained;Selective Focused Attention: Appears intact Sustained Attention: Appears intact Selective Attention: Impaired Selective Attention Impairment: Verbal complex;Functional complex Memory: Impaired Memory Impairment: Storage deficit;Retrieval deficit;Decreased recall of new information;Decreased short term memory;Decreased long term memory Decreased Long Term Memory: Verbal basic;Functional basic Decreased Short Term Memory: Verbal basic;Functional basic Awareness: Impaired Awareness Impairment: Anticipatory impairment;Emergent impairment Problem Solving: Impaired Problem Solving Impairment: Verbal complex;Functional complex Behaviors: Impulsive;Lability (mild) Safety/Judgment: Impaired Sensation Sensation Light Touch: Appears Intact Hot/Cold: Appears Intact Proprioception: Impaired by gross assessment Stereognosis: Appears Intact Coordination Gross Motor Movements are Fluid and Coordinated: No Coordination and Movement Description: Mild RLE ataxia/proprioception deficits. Generalized weakness and deconditioning Motor  Motor Motor: Abnormal postural alignment and control;Other (comment) Motor - Discharge Observations: Generalized weakness and deconditioning, mild R sided weakness with ataxia and proprioceptive deficits  Mobility Bed Mobility Bed Mobility: Supine to Sit;Sit to Supine;Rolling Right;Rolling Left Rolling Right: Independent with assistive device Rolling Left: Independent with assistive device Supine to Sit: Independent with assistive device Sit to Supine: Independent with assistive device Transfers Transfers: Sit to  Stand;Stand to Sit;Squat Pivot Transfers;Stand Pivot Transfers Sit to Stand: Supervision/Verbal cueing Stand to Sit: Supervision/Verbal cueing Stand Pivot Transfers: Contact Guard/Touching assist Squat Pivot Transfers: Contact Guard/Touching assist Transfer (Assistive device): Rolling walker Locomotion  Gait Ambulation: Yes Gait Assistance: Contact Guard/Touching assist Gait Distance (Feet): 150 Feet Assistive device: Rolling walker Gait Assistance Details: Verbal cues for precautions/safety;Verbal cues for gait pattern;Verbal cues for safe use of DME/AE Gait Gait: Yes Gait Pattern: Impaired Gait Pattern: Step-through pattern;Decreased stride length;Trunk flexed;Narrow base of support;Poor foot clearance - right Stairs / Additional Locomotion Stairs: Yes Stairs Assistance: Contact  Guard/Touching assist Stair Management Technique: Two rails Number of Stairs: 12 Height of Stairs: 6 Wheelchair Mobility Wheelchair Mobility: No  Trunk/Postural Assessment  Cervical Assessment Cervical Assessment: Within Functional Limits Thoracic Assessment Thoracic Assessment: Within Functional Limits Lumbar Assessment Lumbar Assessment: Within Functional Limits Postural Control Postural Control: Within Functional Limits  Balance Standardized Balance Assessment Standardized Balance Assessment: Berg Balance Test Berg Balance Test Sit to Stand: Able to stand  independently using hands Standing Unsupported: Able to stand 2 minutes with supervision Sitting with Back Unsupported but Feet Supported on Floor or Stool: Able to sit safely and securely 2 minutes Stand to Sit: Sits safely with minimal use of hands Transfers: Able to transfer with verbal cueing and /or supervision Standing Unsupported with Eyes Closed: Able to stand 10 seconds with supervision Standing Ubsupported with Feet Together: Able to place feet together independently and stand for 1 minute with supervision From Standing, Reach  Forward with Outstretched Arm: Loses balance while trying/requires external support (anterior LOB requiring assistance to prevent fall) From Standing Position, Pick up Object from Floor: Unable to try/needs assist to keep balance From Standing Position, Turn to Look Behind Over each Shoulder: Needs supervision when turning Turn 360 Degrees: Needs assistance while turning Standing Unsupported, Alternately Place Feet on Step/Stool: Able to complete >2 steps/needs minimal assist Standing Unsupported, One Foot in Front: Able to take small step independently and hold 30 seconds Standing on One Leg: Unable to try or needs assist to prevent fall Total Score: 26 Extremity Assessment      RLE Assessment RLE Assessment: Within Functional Limits General Strength Comments: Grossly 4/5 LLE Assessment LLE Assessment: Within Functional Limits General Strength Comments: Grossly 4/5    Iline Buchinger P Anitta Tenny PT 12/21/2021, 8:36 AM

## 2021-12-21 NOTE — Progress Notes (Signed)
Patient ID: Patrick [name], male   DOB: [birth year], 88 y.o.   MRN: [id number]  Updated wife regarding team conference and BP issues seems to be doing better today. MD feels will be ready for discharge tomorrow. All equipment delivered and set up. Home health arranged and aware discharge was delayed by one day. See in am for any last minute questions.

## 2021-12-21 NOTE — Progress Notes (Signed)
Occupational Therapy Discharge Summary  Patient Details  Name: Patrick [name] MRN: [id number] Date of Birth: 04/21/1927  Today's Date: 12/21/2021 OT Individual Time: 1435-1501 OT Individual Time Calculation (min): 26 min    Patient has met 5 of 6 long term goals due to improved activity tolerance, improved balance, postural control, ability to compensate for deficits, functional use of  RIGHT upper extremity, improved attention, improved awareness, and improved coordination.  Patient to discharge at [facility] Assist level.  Patient's care partner is independent to provide the necessary physical and cognitive assistance at discharge.    Reasons goals not met: Patient requires occasional contact guard for toilet transfers  Recommendation:  Patient will benefit from ongoing skilled OT services in home health setting to continue to advance functional skills in the area of BADL.  Equipment: Electronics engineer, wheelchair, walker  Reasons for discharge: treatment goals met and discharge from hospital  Patient/family agrees with progress made and goals achieved: Yes  Skilled Intervention: Patient resting in bed - wife at bedside.  Reviewed progress toward OT goals and completed discharge planning.  Answered all questions.  Patient and wife ready for discharge tomorrow.  Patient back in bed - bed alarm in place - wife at bedside.     OT Discharge Precautions/Restrictions  Precautions Precautions: Fall Precaution Comments: monitor O2 Restrictions Weight Bearing Restrictions: No  Pain Pain Assessment Pain Scale: 0-10 Pain Score: 0-No pain ADL ADL Eating: Independent Where Assessed-Eating: Chair Grooming: Supervision/safety Where Assessed-Grooming: Standing at sink Upper Body Bathing: Supervision/safety Where Assessed-Upper Body Bathing: Shower Lower Body Bathing: Supervision/safety Where Assessed-Lower Body Bathing: Shower Upper Body Dressing: Independent Where Assessed-Upper  Body Dressing: Chair Lower Body Dressing: Supervision/safety Where Assessed-Lower Body Dressing: Chair, Standing at sink Toileting: Supervision/safety Where Assessed-Toileting: Glass blower/designer: Minimal assistance, Therapist, music Method: Counselling psychologist: Energy manager: Unable to assess Nutritional therapist Method: Heritage manager: Grab bars, Transfer tub bench ADL Comments: Patient able to bathe and dress himself with intermittent cueing and contact guard for any aspects of ambulation Vision Baseline Vision/History: 1 Wears glasses Patient Visual Report: No change from baseline Vision Assessment?: No apparent visual deficits Eye Alignment: Within Functional Limits Ocular Range of Motion: Within Functional Limits Alignment/Gaze Preference: Within Defined Limits Tracking/Visual Pursuits: Able to track stimulus in all quads without difficulty Saccades: Within functional limits Convergence: Within functional limits Perception  Perception: Within Functional Limits Praxis Praxis: Intact Cognition Cognition Overall Cognitive Status: History of cognitive impairments - at baseline Arousal/Alertness: Awake/alert Memory: Impaired Memory Impairment: Storage deficit;Retrieval deficit;Decreased recall of new information;Decreased short term memory;Decreased long term memory Decreased Long Term Memory: Functional complex Decreased Short Term Memory: Functional complex Attention: Selective Focused Attention: Appears intact Focused Attention Impairment: Functional basic Sustained Attention: Appears intact Sustained Attention Impairment: Functional basic Selective Attention Impairment: Functional complex Awareness: Impaired Awareness Impairment: Emergent impairment Problem Solving: Impaired Problem Solving Impairment: Functional complex Organizing: Appears intact (intact  for familiar functional tasks) Organizing Impairment: Functional complex Behaviors: Lability Safety/Judgment: Impaired Brief Interview for Mental Status (BIMS) Repetition of Three Words (First Attempt): 3 Temporal Orientation: Year: Missed by more than 5 years Temporal Orientation: Month: No answer Temporal Orientation: Day: No answer Recall: "Sock": Yes, after cueing ("something to wear") Recall: "Blue": Yes, after cueing ("a color") Recall: "Bed": No, could not recall BIMS Summary Score: 5 Sensation Sensation Light Touch: Appears Intact Hot/Cold: Appears Intact Proprioception: Impaired by gross assessment Stereognosis: Appears Intact Coordination Gross  Motor Movements are Fluid and Coordinated: No Fine Motor Movements are Fluid and Coordinated: No Coordination and Movement Description: Mild RLE ataxia/proprioception deficits. Generalized weakness and deconditioning, intention tremor RUE 9 Hole Peg Test: 40.75 right  34.16 left Motor  Motor Motor: Abnormal postural alignment and control;Ataxia Motor - Discharge Observations: Generalized weakness and deconditioning, mild R sided weakness with ataxia and proprioceptive deficits Mobility  Bed Mobility Bed Mobility: Rolling Right;Rolling Left;Sit to Supine;Supine to Sit Rolling Right: Independent with assistive device Rolling Left: Independent with assistive device Supine to Sit: Independent with assistive device Sit to Supine: Independent with assistive device Transfers Sit to Stand: Supervision/Verbal cueing Stand to Sit: Supervision/Verbal cueing  Trunk/Postural Assessment  Cervical Assessment Cervical Assessment: Within Functional Limits Thoracic Assessment Thoracic Assessment: Within Functional Limits Lumbar Assessment Lumbar Assessment: Within Functional Limits Postural Control Postural Control: Deficits on evaluation (limited trunk mobility)  Balance Balance Balance Assessed: Yes Standardized Balance  Assessment Standardized Balance Assessment: Berg Balance Test Berg Balance Test Sit to Stand: Able to stand  independently using hands Standing Unsupported: Able to stand 2 minutes with supervision Sitting with Back Unsupported but Feet Supported on Floor or Stool: Able to sit safely and securely 2 minutes Stand to Sit: Sits safely with minimal use of hands Transfers: Able to transfer with verbal cueing and /or supervision Standing Unsupported with Eyes Closed: Able to stand 10 seconds with supervision Standing Ubsupported with Feet Together: Able to place feet together independently and stand for 1 minute with supervision From Standing, Reach Forward with Outstretched Arm: Loses balance while trying/requires external support (anterior LOB requiring assistance to prevent fall) From Standing Position, Pick up Object from Floor: Unable to try/needs assist to keep balance From Standing Position, Turn to Look Behind Over each Shoulder: Needs supervision when turning Turn 360 Degrees: Needs assistance while turning Standing Unsupported, Alternately Place Feet on Step/Stool: Able to complete >2 steps/needs minimal assist Standing Unsupported, One Foot in Front: Able to take small step independently and hold 30 seconds Standing on One Leg: Unable to try or needs assist to prevent fall Total Score: 26 Static Sitting Balance Static Sitting - Balance Support: Feet supported Static Sitting - Level of Assistance: 7: Independent Dynamic Sitting Balance Dynamic Sitting - Balance Support: Feet supported Dynamic Sitting - Level of Assistance: 7: Independent Dynamic Sitting - Balance Activities: Lateral lean/weight shifting;Forward lean/weight shifting;Reaching for objects Sitting balance - Comments: Can reach outside of base of support, to floor, to left/right without loss of balance Static Standing Balance Static Standing - Balance Support: During functional activity Static Standing - Level of  Assistance: 5: Stand by assistance Static Standing - Comment/# of Minutes: more than 5 min Dynamic Standing Balance Dynamic Standing - Balance Support: During functional activity Dynamic Standing - Level of Assistance: 4: Min assist Dynamic Standing - Balance Activities: Lateral lean/weight shifting;Forward lean/weight shifting;Reaching for objects Extremity/Trunk Assessment RUE Assessment RUE Assessment: Exceptions to [facility] General Strength Comments: Patient with minor strength deficit in RUE - Grip strength 54lbs Right (58 lbs Left) RUE Body System: Neuro Brunstrum levels for arm and hand: Arm;Hand Brunstrum level for arm: Stage V Relative Independence from Synergy Brunstrum level for hand: Stage VI Isolated joint movements LUE Assessment LUE Assessment: Within Functional Limits Active Range of Motion (AROM) Comments: Pt able to complete ADLs with functional ROM. General Strength Comments: Pt able to complete ADLs with functional strength.   Mariah Milling 12/21/2021, 3:15 PM

## 2021-12-21 NOTE — Progress Notes (Signed)
Physical Therapy Session Note  Patient Details  Name: Patrick [name] MRN: [id number] Date of Birth: 1926/07/03  Today's Date: 12/21/2021 PT Individual Time: 1120-1202 PT Individual Time Calculation (min): 42 min   Short Term Goals: Week 2:  PT Short Term Goal 1 (Week 2): STG=LTG due to ELOS  Skilled Therapeutic Interventions/Progress Updates:     Pt received seated in [facility] and agrees to therapy. No complaint of pain. WC transport to gym for time management. Pt's BP monitored throughout session. Pt does not have TED hose or abdominal binder on. In siting BP is 147/67 and HR 74 bpm. Pt stands and ambulates x600' with RW and CGA, with occasional cues to increase proximity to RW and maintain upright gaze and posture to improve balance. Following ambulation, pt's BP taken again in sitting 171/83 with HR 88 bpm. Pt takes extended seated rest break, then cued to stand with RW and remains standing for 2 minutes. BP taken after static standing for 2 minutes at 158/71 and HR 93 bpm. Pt then ambulates x300' without AD, with minA overall and tactile cues at trunk to promote upright posture due to pt tendency to flex forward and with one LOB anteriorly. Following seated rest break, pt ambulates x150' to main gym, completes x12 6" steps with R hand rail and CGA, with cues for step sequencing and safety, then ambulates x150' back to [facility]. Additional cues provided for safe sequencing of transfer to [facility]. WC transport back to room. Left seated with alarm intact and all needs within reach.  Therapy Documentation Precautions:  Precautions Precautions: Fall Precaution Comments: monitor BP (orthostatic hypotension), minimal impulsivity; decreased safety awareness, generalized deconditioning/weakness Restrictions Weight Bearing Restrictions: No   Therapy/Group: Individual Therapy  Breck Coons, PT, DPT 12/21/2021, 12:31 PM

## 2021-12-21 NOTE — Discharge Summary (Signed)
Physician Discharge Summary  Patient ID: Patrick Rica H Potenza MRN: [id number] DOB/AGE: 06-24-1926 88 y.o.  Admit date: 12/09/2021 Discharge date: 12/22/2021  Discharge Diagnoses:  Principal Problem:   Subarachnoid hematoma with loss of consciousness, sequela ([name]) Active Problems:   AKI (acute kidney injury) ([place])   Memory disorder   Gait abnormality   Stage 3a chronic kidney disease (CKD) ([name])   Orthostatic hypotension   Malnutrition of moderate degree   UTI (urinary tract infection)   Anxiety disorder due to medical condition   Constipation   Dysfunctional voiding of urine   Discharged Condition: stable  Significant Diagnostic Studies: CT HEAD WO CONTRAST (5MM)  Result Date: 12/11/2021 CLINICAL DATA:  88 year old male with neurologic deficit. EXAM: CT HEAD WITHOUT CONTRAST TECHNIQUE: Contiguous axial images were obtained from the base of the skull through the vertex without intravenous contrast. RADIATION DOSE REDUCTION: This exam was performed according to the departmental dose-optimization program which includes automated exposure control, adjustment of the mA and/or kV according to patient size and/or use of iterative reconstruction technique. COMPARISON:  Head CT 12/05/2021.  CTA head and neck 11/09/2021. FINDINGS: Brain: Bilateral extra-axial fluid was isointense to CSF by MRI in June, but there is now a small hyperdense component of subdural hematoma on the right (series 3, image 18). But trace midline shift is toward the right side, unchanged from June. Small left superior frontal gyrus hemorrhagic focus has faded since 11/08/2021 with residual on series 3, image 24. And this corresponds to coronal image 34. No regional edema or mass effect. But there is also trace blood products now in the left subdural space such as series 6, image 37. Basilar cisterns remain normal, stable since June. Stable gray-white matter differentiation throughout the brain. No cortically based acute infarct  identified. Vascular: Calcified atherosclerosis at the skull base. No suspicious intracranial vascular hyperdensity. Skull: No acute osseous abnormality identified. Sinuses/Orbits: Visualized paranasal sinuses and mastoids are stable and well aerated. Other: No acute orbit or scalp soft tissue finding. IMPRESSION: 1. Trace hemorrhage has developed within asymmetric bilateral Subdural Hygromas since June. But underlying mild rightward midline shift is unchanged since last month. Consider continued noncontrast CT surveillance. 2. Small left superior frontal gyrus intra-axial hemorrhage has faded since June. 3. No acute or evolving cerebral infarct identified by CT. Electronically Signed   By: Genevie Ann M.D.   On: 12/11/2021 10:33   CT Head Wo Contrast  Result Date: 12/05/2021 CLINICAL DATA:  Head trauma, minor (Age >= 86y).  Fall weakness EXAM: CT HEAD WITHOUT CONTRAST TECHNIQUE: Contiguous axial images were obtained from the base of the skull through the vertex without intravenous contrast. RADIATION DOSE REDUCTION: This exam was performed according to the departmental dose-optimization program which includes automated exposure control, adjustment of the mA and/or kV according to patient size and/or use of iterative reconstruction technique. COMPARISON:  None Available. BRAIN: BRAIN Cerebral ventricle sizes are concordant with the degree of cerebral volume loss. Patchy and confluent areas of decreased attenuation are noted throughout the deep and periventricular white matter of the cerebral hemispheres bilaterally, compatible with chronic microvascular ischemic disease. No evidence of large-territorial acute infarction. No definite parenchymal hemorrhage. Interval development of a vague 0.4 cm left frontal hyperdensity along the gray-white matter junction (4:32, 2:25). Trace foci of subarachnoid hemorrhage along the right frontal lobe not fully excluded (2:27, 4:41). Bilateral chronic hygromas. No mass effect or  midline shift. No hydrocephalus. Basilar cisterns are patent. Vascular: No hyperdense vessel. Skull: No acute fracture or focal  lesion. Sinuses/Orbits: Paranasal sinuses and mastoid air cells are clear. Bilateral lens replacement. Otherwise the orbits are unremarkable. Other: None. IMPRESSION: 1. Trace foci of subarachnoid hemorrhage along the right frontal lobe not fully excluded. Consider repeat CT head in 6 hours to evaluate for stability. 2. Interval development of an indeterminate vague 0.4 cm left frontal hyperdensity along the gray-white matter junction. Electronically Signed   By: Iven Finn M.D.   On: 12/05/2021 16:51    Labs:  Basic Metabolic Panel:    Latest Ref Rng & Units 12/19/2021    7:40 AM 12/12/2021    5:19 AM 12/10/2021    5:18 AM  BMP  Glucose 70 - 99 mg/dL 160  103  112   BUN 8 - 23 mg/dL '15  13  18   '$ Creatinine 0.61 - 1.24 mg/dL 1.17  1.07  1.17   Sodium 135 - 145 mmol/L 141  139  139   Potassium 3.5 - 5.1 mmol/L 4.3  4.0  3.8   Chloride 98 - 111 mmol/L 107  104  104   CO2 22 - 32 mmol/L '25  26  27   '$ Calcium 8.9 - 10.3 mg/dL 8.7  8.7  8.6      CBC:    Latest Ref Rng & Units 12/19/2021    7:40 AM 12/12/2021    5:19 AM 12/10/2021    5:18 AM  CBC  WBC 4.0 - 10.5 K/uL 5.7  7.4  8.7   Hemoglobin 13.0 - 17.0 g/dL 13.0  13.6  13.3   Hematocrit 39.0 - 52.0 % 39.5  40.6  39.8   Platelets 150 - 400 K/uL 117  87  89      CBG: No results for input(s): "GLUCAP" in the last 168 hours.  Brief HPI:   Patrick [name] is a 88 y.o. male with history of HTN, peripheral neuropathy with bilateral foot drop, CAD, dementia, vitamin B12 deficiency, chronic LBP with neurogenic claudication, multiple falls due to orthostatic changes, recent admission for metabolic encephalopathy secondary to [place] in setting of thrombocytopenia.  He was also found to have severe stenosis distal B and bilateral PCA and was discharged to home with home health therapy.  He was referred to Dr. Marin Olp and on  follow-up appointment on 07/24 patient was noted to have mild ITP as well as reports of lethargy with excessive sleeping, poor p.o. intake and recurrent falls.    He was sent to ED for evaluation and CT of head that showed trace foci of SAH in right frontal and interval development of left frontal hypodensity as well as incidental chronic bilateral hygromas.  Neurosurgery was consulted and did not feel intervention was needed.  He was noted to have elevated blood pressure but also was found to have severe issues with orthostatic changes at discharge.  He also reported significant issues with frequency night prior to discharge.  He was noted to be limited by weakness with balance deficits as well as dizziness due to orthostatic changes.  At baseline, wife reported patient slept from 9 PM to approximately noon or 1 PM the next day.  Therapy was working with patient and CIR was recommended due to functional decline.   Hospital Course: Patrick Rica H Glauser was admitted to rehab 12/09/2021 for inpatient therapies to consist of PT, ST and OT at least three hours five days a week. Past admission physiatrist, therapy team and rehab RN have worked together to provide customized collaborative inpatient rehab.his blood pressures were  monitored on TID basis and and initially he continued to have significant orthostatic changes.  Abdominal binder and thigh-high teds were used to help manage symptoms.  He was also encouraged to increase fluid intake.  His wife has been very supportive and provided ego support throughout his stay to help with mood and to encourage him.  BuSpar was added to help manage anxiety levels and as his sleep-wake disruption has resolved, his overall activity tolerance has improved.  He does continue to fluctuate at times but orthostatic symptoms have mostly resolved.    Wife reported issues with frequency at admission and PVR check showed that patient was having  urinary retention and was started on  toileting program.  He was also found to have staph hemolytica  UTI and was treated with 5-day course of Keflex.  PVRs were checked initially showing some retention.  Proscar was added to help with voiding.  Trial of Flomax resulted in recurrent orthostatic symptoms therefore this was discontinued.  He has had some issues with constipation and MiraLAX was added with additional interventions as needed.  He was noted to have decrease in appetite secondary to thrush.  Nystatin mouthwash was added with good results and he is to taper this off after discharge. He will continue to receive follow-up home health PT and OT by [facility] home health after discharge.    Rehab course: During patient's stay in rehab weekly team conferences were held to monitor patient's progress, set goals and discuss barriers to discharge. At admission, patient required min assist with mobility and min to mod assist with ADL tasks.  He exhibited occasional slurred speech and required extra time and mod cues to recall biographical information besides name and DOB.  He has had improvement in activity tolerance, balance, postural control as well as ability to compensate for deficits. He has had improvement in functional use RUE  and RLE as well as improvement in awareness.  He requires min assist to complete ADL tasks. He requires contact-guard assist for transfers and to ambulate 300-600 feet with rolling walker and occasional cues for posture.  Patient swallowing cognition is at baseline.  Patient and family have been educated on energy conservation as well as environmental modifications for speech comprehends ability and intelligibility and speech therapy signed off on 08/01.  Family education has been completed with wife.  Disposition: Home  Diet: Regular  Special Instructions: Continue use of teds when out of bed and use abdominal binder additionally if symptomatic.   Increase time out of bed to help with acclamation of blood  pressure   Discharge Instructions     Ambulatory referral to Neurology   Complete by: As directed    An appointment is requested in approximately: 4 weeks   Ambulatory referral to Physical Medicine Rehab   Complete by: As directed    [place] follow up      Allergies as of 12/22/2021       Reactions   Flomax [tamsulosin] Other (See Comments)   Hypotension, dizziness   Pravachol Other (See Comments)   Unknown   Triazolam Other (See Comments)   "Makes me crazy"        Medication List     STOP taking these medications    allopurinol 100 MG tablet Commonly known as: ZYLOPRIM   cephALEXin 500 MG capsule Commonly known as: KEFLEX   diphenhydramine-acetaminophen 25-500 MG Tabs tablet Commonly known as: TYLENOL PM   lidocaine 5 % Commonly known as: [place] OP  Vitamin D (Ergocalciferol) 1.25 MG (50000 UNIT) Caps capsule Commonly known as: DRISDOL       TAKE these medications    busPIRone 5 MG tablet Commonly known as: BUSPAR Take 1 tablet (5 mg total) by mouth 2 (two) times daily.   divalproex 250 MG DR tablet Commonly known as: DEPAKOTE Take 1 tablet (250 mg total) by mouth 2 (two) times daily.   ezetimibe 10 MG tablet Commonly known as: ZETIA Take 1 tablet (10 mg total) by mouth daily.   finasteride 5 MG tablet Commonly known as: PROSCAR Take 1 tablet (5 mg total) by mouth daily.   fludrocortisone 0.1 MG tablet Commonly known as: FLORINEF Take 0.5 tablets (0.05 mg total) by mouth daily.   levothyroxine 112 MCG tablet Commonly known as: SYNTHROID Take 1 tablet (112 mcg total) by mouth daily.   memantine 10 MG tablet Commonly known as: NAMENDA Take 1 tablet (10 mg total) by mouth at bedtime.   mirtazapine 15 MG tablet Commonly known as: REMERON Take 1 tablet (15 mg total) by mouth at bedtime.   multivitamin with minerals Tabs tablet Take 1 tablet by mouth daily.   nitroGLYCERIN 0.4 MG SL tablet Commonly known as: NITROSTAT Place  1 tablet (0.4 mg total) under the tongue every 5 (five) minutes as needed. What changed: reasons to take this   nystatin 100000 UNIT/ML suspension Commonly known as: MYCOSTATIN Take 5 mLs (500,000 Units total) by mouth 4 (four) times daily.   oxyCODONE-acetaminophen 5-325 MG tablet Commonly known as: PERCOCET/ROXICET Take 1 tablet by mouth every 6 (six) hours as needed for severe pain.   pantoprazole 40 MG tablet Commonly known as: Protonix Take 1 tablet (40 mg total) by mouth daily before breakfast.   polyethylene glycol powder 17 GM/SCOOP powder Commonly known as: GLYCOLAX/MIRALAX Take 17 g by mouth 2 (two) times daily.   vitamin D3 25 MCG tablet Commonly known as: CHOLECALCIFEROL Take 1 tablet (1,000 Units total) by mouth daily.        Follow-up Information     Ginger Organ., MD Follow up.   Specialty: Internal Medicine Why: Call in 1-2 days for post hospital follow up Contact information: [place] [address] [phone]         Nahser, Wonda Cheng, MD .   Specialty: Cardiology Contact information: [place] [address] [phone]         Izora Ribas, MD Follow up.   Specialty: Physical Medicine and Rehabilitation Why: office will call you with follow up appointment Contact information: [address] [place] [address] [phone]         [facility] Follow up.   Why: office will call you with follow up appointment Contact information: [address]     [place] [address] [phone]                Signed: Bary Leriche 12/22/2021, 10:18 AM

## 2021-12-21 NOTE — Progress Notes (Signed)
PROGRESS NOTE   Subjective/Complaints: Did much better with therapy yesterday but DBP dropped to 43 this morning and Patrick [name] is nervous about his need for abdominal binder/teds yesterday   ROS:  Pt denies SOB, abd pain, CP, N/V/D, and vision changes. + constipation, +orthostatic hypotension Objective:   No results found. Recent Labs    12/19/21 0740  WBC 5.7  HGB 13.0  HCT 39.5  PLT 117*    Recent Labs    12/19/21 0740  NA 141  K 4.3  CL 107  CO2 25  GLUCOSE 160*  BUN 15  CREATININE 1.17  CALCIUM 8.7*     Intake/Output Summary (Last 24 hours) at 12/21/2021 1004 Last data filed at 12/20/2021 1857 Gross per 24 hour  Intake 460 ml  Output 375 ml  Net 85 ml        Physical Exam: Vital Signs Blood pressure (!) 141/43, pulse 72, temperature 97.8 F (36.6 C), resp. rate 18, height 6' (1.829 m), weight 61.1 kg, SpO2 99 %.  Gen: no distress, normal appearing, BMI 18.27 HEENT: oral mucosa pink and moist, NCAT Cardio: Reg rate Chest: normal effort, normal rate of breathing Abd: soft, non-distended Ext: no edema Psychiatric: appropriate; flat but interactive Neurological: alert Neurologic: Cranial nerves II through XII intact, motor strength is 5/5 in bilateral deltoid, bicep, tricep, grip, hip flexor, knee extensors, ankle dorsiflexor and plantar flexor Sensory exam normal sensation to light touch and proprioception in bilateral upper and lower extremities Cerebellar exam normal finger to nose to finger as well as heel to shin in bilateral upper and lower extremities Musculoskeletal: Full range of motion in all 4 extremities. No joint swelling or redness noted    Assessment/Plan: 1. Functional deficits which require 3+ hours per day of interdisciplinary therapy in a comprehensive inpatient rehab setting. Physiatrist is providing close team supervision and 24 hour management of active medical problems  listed below. Physiatrist and rehab team continue to assess barriers to discharge/monitor patient progress toward functional and medical goals  Care Tool:  Bathing    Body parts bathed by patient: Right arm, Left arm, Chest, Abdomen, Front perineal area, Right upper leg, Face, Left lower leg, Right lower leg, Left upper leg, Buttocks   Body parts bathed by helper: Buttocks     Bathing assist Assist Level: Supervision/Verbal cueing     Upper Body Dressing/Undressing Upper body dressing   What is the patient wearing?: Pull over shirt    Upper body assist Assist Level: Set up assist    Lower Body Dressing/Undressing Lower body dressing      What is the patient wearing?: Underwear/pull up, Pants     Lower body assist Assist for lower body dressing: Minimal Assistance - Patient > 75%     Toileting Toileting Toileting Activity did not occur (Clothing management and hygiene only): N/A (no void or bm)  Toileting assist Assist for toileting: Minimal Assistance - Patient > 75%     Transfers Chair/bed transfer  Transfers assist  Chair/bed transfer activity did not occur: Safety/medical concerns  Chair/bed transfer assist level: Contact Guard/Touching assist     Locomotion Ambulation   Ambulation assist  Assist level: Minimal Assistance - Patient > 75% Assistive device: Walker-rolling Max distance: 150   Walk 10 feet activity   Assist     Assist level: Minimal Assistance - Patient > 75% Assistive device: Walker-rolling   Walk 50 feet activity   Assist    Assist level: Minimal Assistance - Patient > 75% Assistive device: Walker-rolling    Walk 150 feet activity   Assist    Assist level: Minimal Assistance - Patient > 75% Assistive device: Walker-rolling    Walk 10 feet on uneven surface  activity   Assist     Assist level: Minimal Assistance - Patient > 75%     Wheelchair     Assist Is the patient using a wheelchair?: No              Wheelchair 50 feet with 2 turns activity    Assist            Wheelchair 150 feet activity     Assist          Blood pressure (!) 141/43, pulse 72, temperature 97.8 F (36.6 C), resp. rate 18, height 6' (1.829 m), weight 61.1 kg, SpO2 99 %.  Medical Problem List and Plan: 1. Functional deficits secondary to traumatic ICH             -patient may shower             -ELOS/Goals: 8/9, CGA/ min A             -Continue CIR- PT, OT and SLP  Extend to Thursday given hypotension  -Interdisciplinary Team Conference today   2.  Antithrombotics: -DVT/anticoagulation:  Mechanical: Sequential compression devices, below knee Bilateral lower extremities             -antiplatelet therapy: N/A 3. Spinal pain: On depakote for neuropathy (per chart review).             --Used oxycodone 10/325 mg 2-3 times a day.             -scheduling oxycodone at night 4. Mood/Behavior/Sleep: LCSW to follow for evaluation and support.              --Schedule oxycodone 5/325 mg/Hs for pain/sleep.              -antipsychotic agents: N/A  -Melatonin '3mg'$  started 8/2 for sleep  -He was started on Buspar '15mg'$  '5mg'$  BID on 8/2 to help with anxiety  -Will stop melatonin 8/4  5. Neuropsych/cognition: This patient is not capable of making decisions on his own behalf. 6. Skin/Wound Care: Routine pressure relief measures.  7. Fluids/Electrolytes/Nutrition: Monitor I/O. Encourage fluid intake.  8. HTN w/significant orthostatic changes: Monitor BP TID w/orthostatic vitals             --orthostatic changes have been ongoing for years per records --Continue Proscar, change to bedtime. Change florinef (added 07/28) to 7 am prior to therapy. --Encourage fluid intake.  Binder/TEDs daily when out of bed.  -8/1 he continues to have orthostatic hypotension, however improved overall from prior 8/5- will change Proscar to Flomax since orthostatic hypotension better per pt- will schedule at night 8/8: d/c  flomax given hypotension- added flomax as severe allergy 8/9: messaged therapy team to let me know how pressures hold during session today 9. CKD?: Will check CMET in am.  -Scr 1.17 8/7, continue to encourage fluid intake 10. Mild ITP/Thrombocytopenia:  stable and mild     Latest Ref Rng & Units  12/19/2021    7:40 AM 12/12/2021    5:19 AM 12/10/2021    5:18 AM  CBC  WBC 4.0 - 10.5 K/uL 5.7  7.4  8.7   Hemoglobin 13.0 - 17.0 g/dL 13.0  13.6  13.3   Hematocrit 39.0 - 52.0 % 39.5  40.6  39.8   Platelets 150 - 400 K/uL 117  87  89                 --Monitor for signs of bleeding.  11. CAD: Monitor for symptoms with increase in activity. Has been on florinef in the past.   12. H/o Food impaction/gastritis: Intake has been poor.  --Will order supervision for safety with meals/to assist w/meals.  13. Urgency/Dysuria: UC + for staph hemolytica- continue keflex for 5 day course-last dose 8/2 -Lidocaine Jelly and coude if IC needed -Will decrease frequency of PVR -On proscar at bedtime, medication options limited by orthostatic hypotension 8/3 has been continent   8/5- required peeing many times at night- will try low dose Flomax tonight- but change if not hypotensive tonight/tomorrow 14. Fatigue: Vitamin D level 60, start 1,000U daily supplement.  15. Tachycardia: could be secondary to fludricortisone but this is helping BP, continue to monitor HR TID  -8/7 HR stable at 85 16. Transient increased in right sided weakness: CT head obtained and negative for acute pathology, We discussed initial location of bleed , as well as subsequent CT findings  17. Constipation  -8/1 Schedule miralax daily  -8/4 sorbitol '60mg'$  ordered  8/5- still no BM- will give more sorbitol and soap suds enema.   8/7 order soap suds enema today 18. Moderate malnutrition  -continue Magic cup, MVI, Ensure max  19, Thrush/decreased appetite/food "tastes weird": discussed with wife that he can stop nystatin once thrush  resolves, continue for now 20. Underweight with BMI 18.27: discussed importance of high protein foods to maintain muscle mass.     LOS: 12 days A FACE TO FACE EVALUATION WAS PERFORMED  Patrick [name] Patrick [name] 12/21/2021, 10:04 AM

## 2021-12-21 NOTE — Progress Notes (Signed)
Occupational Therapy Session Note  Patient Details  Name: Patrick [name] MRN: [id number] Date of Birth: 03/28/1927  Today's Date: 12/21/2021 OT Individual Time: 1006-1101 OT Individual Time Calculation (min): 55 min    Short Term Goals: Week 2:     Skilled Therapeutic Interventions/Progress Updates:    Patient received up in wheelchair - BP monitored.  Patient transported via wheelchair to shower.  Showered with supervision.  Dressed himself with set up assistance, Standing at sink to complete hygiene - maintained BP in standing.  Spoke with wife who called during session and relayed BP information - patient maintaining BP without teds and abdominal binder this session - on track to discharge tomorrow if this continues through today.  Patient left up in wheelchair with safety belt in place and engaged, and call bell bedside table in front.    Therapy Documentation Precautions:  Precautions Precautions: Fall Precaution Comments: monitor BP (orthostatic hypotension), minimal impulsivity Restrictions Weight Bearing Restrictions: No   Vital Signs: Supine:  129/69 Seated:  128/59 Standing:  99/47  Standing after shower:  131/71 Pulse 101  Pain:  Denies pain    Therapy/Group: Individual Therapy  Mariah Milling 12/21/2021, 11:30 AM

## 2021-12-22 ENCOUNTER — Other Ambulatory Visit (HOSPITAL_COMMUNITY): Payer: Self-pay

## 2021-12-22 NOTE — Progress Notes (Signed)
Inpatient Rehabilitation Care Coordinator Discharge Note   Patient Details  Name: Patrick [name] MRN: [id number] Date of Birth: 06/18/1926   Discharge location: HOME WITH WIFE WHO CAN PROVIDE 24/7 CARE  Length of Stay: 13 DAYS  Discharge activity level: [place]  Home/community participation: ACTIVE  Patient response EZ:MOQHUT Literacy - How often do you need to have someone help you when you read instructions, pamphlets, or other written material from your doctor or pharmacy?: Often  Patient response ML:YYTKPT Isolation - How often do you feel lonely or isolated from those around you?: Never  Services provided included: MD, RD, PT, OT, SLP, RN, CM, Pharmacy, SW  Financial Services:  Financial Services Utilized: Medicare    Choices offered to/list presented to: PT AND WIFE  Follow-up services arranged:  Home Health, DME, Patient/Family request agency [place]: [place]    DME : [facility] BED, OVERBED TABLE & WHEELCHAIR HAS ALL OTHER NEEDED EQUIPMENT HH/DME Requested Agency: [facility]  Patient response to transportation need: Is the patient able to respond to transportation needs?: Yes In the past 12 months, has lack of transportation kept you from medical appointments or from getting medications?: No In the past 12 months, has lack of transportation kept you from meetings, work, or from getting things needed for daily living?: No    Comments (or additional information): WIFE WAS [place] HIS BP ISSUES BUT FEELS BETTER TODAY AND PAST TWO DAYS.  Patient/Family verbalized understanding of follow-up arrangements:  Yes  Individual responsible for coordination of the follow-up plan: BEVERLY-WIFE [phone]  Confirmed correct DME delivered: Elease Hashimoto 12/22/2021    Elease Hashimoto

## 2021-12-22 NOTE — Plan of Care (Signed)
Pt to discharge today.

## 2021-12-22 NOTE — Progress Notes (Signed)
Slept well last night. Had a prior event where Medication was stacked behind the throat while taking night pills. Could not cough pill out, so was offered apple sauce, chocolate pudding, and water to help facilitate swallowing. Patient denies SOB, or any respiratory distress. Looks well perfused centrally and peripherally. Repositioned and made comfortable in bed. Wife in the room with patient. Monitored for  distress, but patient was okay. Will continue monitoring and document any further changes.

## 2021-12-22 NOTE — Progress Notes (Signed)
PROGRESS NOTE   Subjective/Complaints: Patient to be discharged home today Did great with therapy yesterday! He will follow-up with [place] in PM&R clinic Bps stable   ROS:  Pt denies SOB, abd pain, CP, N/V/D, and vision changes. + constipation, +orthostatic hypotension Objective:   No results found. No results for input(s): "WBC", "HGB", "HCT", "PLT" in the last 72 hours.   No results for input(s): "NA", "K", "CL", "CO2", "GLUCOSE", "BUN", "CREATININE", "CALCIUM" in the last 72 hours.    Intake/Output Summary (Last 24 hours) at 12/22/2021 0948 Last data filed at 12/22/2021 0656 Gross per 24 hour  Intake 240 ml  Output 400 ml  Net -160 ml        Physical Exam: Vital Signs Blood pressure (!) 143/57, pulse 84, temperature 98.1 F (36.7 C), resp. rate 16, height 6' (1.829 m), weight 61.1 kg, SpO2 99 %.  Gen: no distress, normal appearing, BMI 18.27, sitting up at edge of bed eating breakfast HEENT: oral mucosa pink and moist, NCAT Cardio: Reg rate Chest: normal effort, normal rate of breathing Abd: soft, non-distended Ext: no edema Psychiatric: appropriate; flat but interactive Neurological: alert Neurologic: Cranial nerves II through XII intact, motor strength is 5/5 in bilateral deltoid, bicep, tricep, grip, hip flexor, knee extensors, ankle dorsiflexor and plantar flexor Sensory exam normal sensation to light touch and proprioception in bilateral upper and lower extremities Cerebellar exam normal finger to nose to finger as well as heel to shin in bilateral upper and lower extremities Musculoskeletal: Full range of motion in all 4 extremities. No joint swelling or redness noted    Assessment/Plan: 1. Functional deficits which require 3+ hours per day of interdisciplinary therapy in a comprehensive inpatient rehab setting. Physiatrist is providing close team supervision and 24 hour management of active medical  problems listed below. Physiatrist and rehab team continue to assess barriers to discharge/monitor patient progress toward functional and medical goals  Care Tool:  Bathing    Body parts bathed by patient: Right arm, Left arm, Chest, Abdomen, Front perineal area, Right upper leg, Face, Left lower leg, Right lower leg, Left upper leg, Buttocks   Body parts bathed by helper: Buttocks     Bathing assist Assist Level: Supervision/Verbal cueing     Upper Body Dressing/Undressing Upper body dressing   What is the patient wearing?: Pull over shirt    Upper body assist Assist Level: Independent    Lower Body Dressing/Undressing Lower body dressing      What is the patient wearing?: Underwear/pull up, Pants     Lower body assist Assist for lower body dressing: Independent with assitive device     Toileting Toileting Toileting Activity did not occur (Clothing management and hygiene only): N/A (no void or bm)  Toileting assist Assist for toileting: Contact Guard/Touching assist     Transfers Chair/bed transfer  Transfers assist  Chair/bed transfer activity did not occur: Safety/medical concerns  Chair/bed transfer assist level: Supervision/Verbal cueing     Locomotion Ambulation   Ambulation assist      Assist level: Contact Guard/Touching assist Assistive device: Walker-rolling Max distance: 150'   Walk 10 feet activity   Assist     Assist  level: Contact Guard/Touching assist Assistive device: Walker-rolling   Walk 50 feet activity   Assist    Assist level: Contact Guard/Touching assist Assistive device: Walker-rolling    Walk 150 feet activity   Assist    Assist level: Contact Guard/Touching assist Assistive device: Walker-rolling    Walk 10 feet on uneven surface  activity   Assist     Assist level: Contact Guard/Touching assist Assistive device: Walker-rolling   Wheelchair     Assist Is the patient using a wheelchair?: No              Wheelchair 50 feet with 2 turns activity    Assist            Wheelchair 150 feet activity     Assist          Blood pressure (!) 143/57, pulse 84, temperature 98.1 F (36.7 C), resp. rate 16, height 6' (1.829 m), weight 61.1 kg, SpO2 99 %.  Medical Problem List and Plan: 1. Functional deficits secondary to traumatic ICH             -patient may shower             -ELOS/Goals: 8/9, CGA/ min A             -d/c home today 2.  Antithrombotics: -DVT/anticoagulation:  Mechanical: Sequential compression devices, below knee Bilateral lower extremities             -antiplatelet therapy: N/A 3. Spinal pain: Continue depakote for neuropathy (per chart review).             --Used oxycodone 10/325 mg 2-3 times a day.             -scheduling oxycodone at night 4. Insomnia: LCSW to follow for evaluation and support.              --Continue scheduled oxycodone 5/325 mg/Hs for pain/sleep.              -antipsychotic agents: N/A  -Melatonin '3mg'$  started 8/2 for sleep  -He was started on Buspar '15mg'$  '5mg'$  BID on 8/2 to help with anxiety  -Will stop melatonin 8/4  5. Neuropsych/cognition: This patient is not capable of making decisions on his own behalf. 6. Skin/Wound Care: Routine pressure relief measures.  7. Fluids/Electrolytes/Nutrition: Monitor I/O. Encourage fluid intake.  8. HTN w/significant orthostatic changes: Monitor BP TID w/orthostatic vitals             --orthostatic changes have been ongoing for years per records --Continue Proscar, change to bedtime. Change florinef (added 07/28) to 7 am prior to therapy. --Encourage fluid intake.  Binder/TEDs daily when out of bed.  -8/1 he continues to have orthostatic hypotension, however improved overall from prior 8/5- will change Proscar to Flomax since orthostatic hypotension better per pt- will schedule at night 8/8: d/c flomax given hypotension- added flomax as severe allergy 8/9: messaged therapy team to let  me know how pressures hold during session today 9. CKD?: Will check CMET in am.  -Scr 1.17 8/7, continue to encourage fluid intake 10. Mild ITP/Thrombocytopenia:  stable and mild     Latest Ref Rng & Units 12/19/2021    7:40 AM 12/12/2021    5:19 AM 12/10/2021    5:18 AM  CBC  WBC 4.0 - 10.5 K/uL 5.7  7.4  8.7   Hemoglobin 13.0 - 17.0 g/dL 13.0  13.6  13.3   Hematocrit 39.0 - 52.0 % 39.5  40.6  39.8   Platelets 150 - 400 K/uL 117  87  89                 --Monitor for signs of bleeding.  11. CAD: Monitor for symptoms with increase in activity. Has been on florinef in the past.   12. H/o Food impaction/gastritis: Intake has been poor.  --Will order supervision for safety with meals/to assist w/meals.  13. Urgency/Dysuria: UC + for staph hemolytica- continue keflex for 5 day course-last dose 8/2 -Lidocaine Jelly and coude if IC needed -Will decrease frequency of PVR -On proscar at bedtime, medication options limited by orthostatic hypotension 8/3 has been continent   8/5- required peeing many times at night- will try low dose Flomax tonight- but change if not hypotensive tonight/tomorrow 14. Fatigue: Vitamin D level 60, start 1,000U daily supplement.  15. Tachycardia: could be secondary to fludricortisone but this is helping BP, continue to monitor HR TID  -8/7 HR stable at 85 16. Transient increased in right sided weakness: CT head obtained and negative for acute pathology, We discussed initial location of bleed , as well as subsequent CT findings  17. Constipation  -8/1 Schedule miralax daily  -8/4 sorbitol '60mg'$  ordered  8/5- still no BM- will give more sorbitol and soap suds enema.   8/7 order soap suds enema today 18. Moderate malnutrition  -continue Magic cup, MVI, Ensure max  19, Thrush/decreased appetite/food "tastes weird": discussed with wife that he can stop nystatin once thrush resolves, continue for now 20. Underweight with BMI 18.27: discussed importance of high protein  foods to maintain muscle mass.    >30 minutes spent in discharge of patient including review of medications and follow-up appointments, physical examination, and in answering all patient's questions   LOS: 13 days A FACE TO [facility] 12/22/2021, 9:48 AM

## 2021-12-22 NOTE — Progress Notes (Deleted)
HPI: Follow-up coronary artery disease.  Cardiac catheterization July 2020 showed occluded LAD, circumflex and right coronary artery.  Saphenous vein graft to the right coronary artery was patent, sequential saphenous vein graft to the OM1 and distal circumflex was patent and the LIMA to the diagonal and mid LAD was patent as well.  Note there was severe stenosis in the saphenous vein graft to the distal right coronary artery which was treated with a drug-eluting stent.  ABIs January 2021 normal. Monitor September 2021 showed sinus rhythm.  Nuclear study February 2021 showed ejection fraction 58% and no ischemia or infarction.  Has had difficulties with orthostasis treated with Florinef.  Based on previous notes he has had decline in overall health.  Most recent echocardiogram June 2023 showed normal LV function, mild left ventricular hypertrophy, grade 1 diastolic dysfunction, mild biatrial enlargement, mild mitral regurgitation, moderate aortic insufficiency.  Recently found to have small intracranial hemorrhage.  CTA June 2023 showed near occlusion of the distal basilar artery and bilateral PCAs, 65% left internal carotid artery lesion, moderate to severe stenosis in the left MCA M1 and moderate to severe in the right vertebral artery.  Not placed on aspirin immediately due to recent intracranial hemorrhage.  Patient was readmitted July 2023 with recurrent falls.  Ultimately transferred to rehab.  Since last seen   Current Outpatient Medications  Medication Sig Dispense Refill   busPIRone (BUSPAR) 5 MG tablet Take 1 tablet (5 mg total) by mouth 2 (two) times daily. 60 tablet 0   divalproex (DEPAKOTE) 250 MG DR tablet Take 1 tablet (250 mg total) by mouth 2 (two) times daily. 60 tablet 0   ezetimibe (ZETIA) 10 MG tablet Take 1 tablet (10 mg total) by mouth daily. 30 tablet 0   finasteride (PROSCAR) 5 MG tablet Take 1 tablet (5 mg total) by mouth daily. 30 tablet 0   fludrocortisone (FLORINEF) 0.1  MG tablet Take 0.5 tablets (0.05 mg total) by mouth daily. 15 tablet 0   levothyroxine (SYNTHROID) 112 MCG tablet Take 1 tablet (112 mcg total) by mouth daily. 30 tablet 0   memantine (NAMENDA) 10 MG tablet Take 1 tablet (10 mg total) by mouth at bedtime. 30 tablet 0   mirtazapine (REMERON) 15 MG tablet Take 1 tablet (15 mg total) by mouth at bedtime. 30 tablet 0   Multiple Vitamin (MULTIVITAMIN WITH MINERALS) TABS tablet Take 1 tablet by mouth daily.     nitroGLYCERIN (NITROSTAT) 0.4 MG SL tablet Place 1 tablet (0.4 mg total) under the tongue every 5 (five) minutes as needed. (Patient taking differently: Place 0.4 mg under the tongue every 5 (five) minutes as needed for chest pain.) 75 tablet 2   nystatin (MYCOSTATIN) 100000 UNIT/ML suspension Take 5 mLs (500,000 Units total) by mouth 4 (four) times daily. 60 mL 0   oxyCODONE-acetaminophen (PERCOCET/ROXICET) 5-325 MG tablet Take 1 tablet by mouth every 6 (six) hours as needed for severe pain. 30 tablet 0   pantoprazole (PROTONIX) 40 MG tablet Take 1 tablet (40 mg total) by mouth daily before breakfast. 30 tablet 0   polyethylene glycol powder (GLYCOLAX/MIRALAX) 17 GM/SCOOP powder Take 17 g by mouth 2 (two) times daily. 238 g 0   vitamin D3 (CHOLECALCIFEROL) 25 MCG tablet Take 1 tablet (1,000 Units total) by mouth daily. 60 tablet 0   No current facility-administered medications for this visit.   Facility-Administered Medications Ordered in Other Visits  Medication Dose Route Frequency Provider Last Rate Last Admin  acetaminophen (TYLENOL) tablet 325-650 mg  325-650 mg Oral Q4H PRN Love, Pamela S, PA-C       alum & mag hydroxide-simeth (MAALOX/MYLANTA) 200-200-20 MG/5ML suspension 30 mL  30 mL Oral Q4H PRN Love, Pamela S, PA-C       bisacodyl (DULCOLAX) suppository 10 mg  10 mg Rectal Daily PRN Love, Pamela S, PA-C   10 mg at 12/19/21 2050   busPIRone (BUSPAR) tablet 5 mg  5 mg Oral BID Love, Pamela S, PA-C   5 mg at 12/22/21 1610    cholecalciferol (VITAMIN D3) 25 MCG (1000 UNIT) tablet 1,000 Units  1,000 Units Oral Daily Raulkar, Clide Deutscher, MD   1,000 Units at 12/22/21 0850   diphenhydrAMINE (BENADRYL) 12.5 MG/5ML elixir 12.5-25 mg  12.5-25 mg Oral Q6H PRN Love, Pamela S, PA-C       divalproex (DEPAKOTE) DR tablet 250 mg  250 mg Oral BID Love, Pamela S, PA-C   250 mg at 12/22/21 0850   ezetimibe (ZETIA) tablet 10 mg  10 mg Oral Daily Bary Leriche, PA-C   10 mg at 12/22/21 0850   finasteride (PROSCAR) tablet 5 mg  5 mg Oral QPC supper Izora Ribas, MD   5 mg at 12/21/21 1718   fludrocortisone (FLORINEF) tablet 0.05 mg  0.05 mg Oral Daily Bary Leriche, PA-C   0.05 mg at 12/22/21 0849   guaiFENesin-dextromethorphan (ROBITUSSIN DM) 100-10 MG/5ML syrup 5-10 mL  5-10 mL Oral Q6H PRN Love, Pamela S, PA-C       levothyroxine (SYNTHROID) tablet 112 mcg  112 mcg Oral Q0600 Bary Leriche, PA-C   112 mcg at 12/22/21 0851   lidocaine (XYLOCAINE) 2 % jelly   Topical PRN Bary Leriche, PA-C   Given at 12/13/21 1845   memantine (NAMENDA) tablet 10 mg  10 mg Oral QHS Love, Pamela S, PA-C   10 mg at 12/21/21 2006   mirtazapine (REMERON) tablet 15 mg  15 mg Oral QHS Love, Pamela S, PA-C   15 mg at 12/21/21 1718   multivitamin with minerals tablet 1 tablet  1 tablet Oral Daily Raulkar, Clide Deutscher, MD   1 tablet at 12/22/21 0851   nystatin (MYCOSTATIN) 100000 UNIT/ML suspension 500,000 Units  5 mL Oral QID Lovorn, Jinny Blossom, MD   500,000 Units at 12/22/21 0850   oxyCODONE-acetaminophen (PERCOCET/ROXICET) 5-325 MG per tablet 1 tablet  1 tablet Oral Q8H PRN Love, Pamela S, PA-C       oxyCODONE-acetaminophen (PERCOCET/ROXICET) 5-325 MG per tablet 1 tablet  1 tablet Oral QHS Love, Pamela S, PA-C   1 tablet at 12/21/21 2006   pantoprazole (PROTONIX) EC tablet 40 mg  40 mg Oral QAC breakfast Bary Leriche, PA-C   40 mg at 12/22/21 0851   polyethylene glycol (MIRALAX / GLYCOLAX) packet 17 g  17 g Oral BID Bary Leriche, PA-C   17 g at 12/22/21  9604   prochlorperazine (COMPAZINE) tablet 5-10 mg  5-10 mg Oral Q6H PRN Reesa Chew S, PA-C       Or   prochlorperazine (COMPAZINE) injection 5-10 mg  5-10 mg Intramuscular Q6H PRN Love, Pamela S, PA-C       Or   prochlorperazine (COMPAZINE) suppository 12.5 mg  12.5 mg Rectal Q6H PRN Love, Pamela S, PA-C       protein supplement (ENSURE MAX) liquid  11 oz Oral Daily Raulkar, Clide Deutscher, MD   11 oz at 12/14/21 1735   sodium phosphate (FLEET) 7-19  GM/118ML enema 1 enema  1 enema Rectal Once PRN Love, Pamela S, PA-C       sorbitol 70 % solution 60 mL  60 mL Oral Daily PRN Jennye Boroughs, MD   60 mL at 12/17/21 1655     Past Medical History:  Diagnosis Date   Bilateral foot-drop 12/17/2019   BPH (benign prostatic hypertrophy)    Diverticulosis    ED (erectile dysfunction)    Food impaction of esophagus w/mild gastritis 06/28/2021   Gait abnormality 12/17/2019   GERD (gastroesophageal reflux disease)    Gout    H/O multiple concussions--as teenager    Hepatitis    hx hepatitis after mono as teenager   History of kidney stones    History of skin cancer    Hyperlipidemia    Hypertension    Hypothyroidism    IHD (ischemic heart disease)    Remote CABG in 1997   Memory disorder 12/17/2019   MI, acute, non ST segment elevation ([place]) 2010   s/p cath with occluded SVG to PD that fills by collaterals and the remainder of his revasculsarization is satisfactory. "the first time they did the stent , the second time they did the graft 8 vessels"   Nocturia    Schatzki's ring--mild    Thrombocytopenia ([place]) 11/19/2018   Tremor, essential 12/17/2019   Urinary frequency     Past Surgical History:  Procedure Laterality Date   APPENDECTOMY     BALLOON DILATION N/A 03/25/2019   Procedure: BALLOON DILATION;  Surgeon: Otis Brace, MD;  Location: [facility];  Service: Gastroenterology;  Laterality: N/A;   CARDIAC CATHETERIZATION  09/08/2008   NORMAL. EF 60%; Occluded SVG to PD that  fills by collaterals and the remainder of his revascularization is satisfactory. he is managed medically.    CORONARY ARTERY BYPASS GRAFT  1997   CABG x 8   CORONARY STENT INTERVENTION N/A 11/18/2018   Procedure: CORONARY STENT INTERVENTION;  Surgeon: Sherren Mocha, MD;  Location: [facility];  Service: Cardiovascular;  Laterality: N/A;   CYSTOSCOPY WITH INSERTION OF UROLIFT N/A 03/19/2015   Procedure: CYSTOSCOPY WITH INSERTION OF FOUR UROLIFTS;  Surgeon: Carolan Clines, MD;  Location: [facility];  Service: Urology;  Laterality: N/A;   ESOPHAGOGASTRODUODENOSCOPY (EGD) WITH PROPOFOL N/A 02/12/2017   Procedure: ESOPHAGOGASTRODUODENOSCOPY (EGD) WITH PROPOFOL;  Surgeon: Otis Brace, MD;  Location: [facility];  Service: Gastroenterology;  Laterality: N/A;   ESOPHAGOGASTRODUODENOSCOPY (EGD) WITH PROPOFOL N/A 07/13/2018   Procedure: ESOPHAGOGASTRODUODENOSCOPY (EGD) WITH PROPOFOL;  Surgeon: Clarene Essex, MD;  Location: [facility];  Service: Endoscopy;  Laterality: N/A;   ESOPHAGOGASTRODUODENOSCOPY (EGD) WITH PROPOFOL N/A 03/25/2019   Procedure: ESOPHAGOGASTRODUODENOSCOPY (EGD) WITH PROPOFOL;  Surgeon: Otis Brace, MD;  Location: [facility];  Service: Gastroenterology;  Laterality: N/A;   ESOPHAGOGASTRODUODENOSCOPY (EGD) WITH PROPOFOL N/A 06/28/2021   Procedure: ESOPHAGOGASTRODUODENOSCOPY (EGD) WITH PROPOFOL;  Surgeon: Wilford Corner, MD;  Location: [facility];  Service: Endoscopy;  Laterality: N/A;   FOREIGN BODY REMOVAL N/A 07/13/2018   Procedure: FOREIGN BODY REMOVAL;  Surgeon: Clarene Essex, MD;  Location: [facility];  Service: Endoscopy;  Laterality: N/A;   IMPACTION REMOVAL  06/28/2021   Procedure: IMPACTION REMOVAL;  Surgeon: Wilford Corner, MD;  Location: [facility];  Service: Endoscopy;;   LEFT HEART CATH AND CORS/GRAFTS ANGIOGRAPHY N/A 11/18/2018   Procedure: LEFT HEART CATH AND CORS/GRAFTS ANGIOGRAPHY;  Surgeon: Sherren Mocha, MD;  Location: [facility];   Service: Cardiovascular;  Laterality: N/A;    Social History   Socioeconomic  History   Marital status: Married    Spouse name: [name]    Number of children: Not on file   Years of education: Not on file   Highest education level: 12th grade  Occupational History   Occupation: Retired    Fish farm manager: RETIRED    Comment: was in [employer]  Tobacco Use   Smoking status: Former    Packs/day: 1.00    Years: 15.00    Total pack years: 15.00    Types: Cigarettes    Quit date: 05/15/1958    Years since quitting: 63.6   Smokeless tobacco: Never  Vaping Use   Vaping Use: Never used  Substance and Sexual Activity   Alcohol use: Yes    Comment: occasional beer   Drug use: No   Sexual activity: Yes  Other Topics Concern   Not on file  Social History Narrative   Mr Granda is a 88 year old retired male who lives at home with his wife, [name].     Social Determinants of Health   Financial Resource Strain: Low Risk  (03/26/2019)   Overall Financial Resource Strain (CARDIA)    Difficulty of Paying Living Expenses: Not hard at all  Food Insecurity: No Food Insecurity (03/03/2019)   Hunger Vital Sign    Worried About Running Out of Food in the Last Year: Never true    Ran Out of Food in the Last Year: Never true  Transportation Needs: No Transportation Needs (03/03/2019)   PRAPARE - Hydrologist (Medical): No    Lack of Transportation (Non-Medical): No  Physical Activity: Not on file  Stress: Not on file  Social Connections: Socially Isolated (03/26/2019)   Social Connection and Isolation Panel [NHANES]    Frequency of Communication with Friends and Family: Once a week    Frequency of Social Gatherings with Friends and Family: Once a week    Attends Religious Services: Never    Marine scientist or Organizations: No    Attends Archivist Meetings: Never    Marital Status: Married  Human resources officer Violence: Not At Risk (03/26/2019)    Humiliation, Afraid, Rape, and Kick questionnaire    Fear of Current or Ex-Partner: No    Emotionally Abused: No    Physically Abused: No    Sexually Abused: No    Family History  Problem Relation Age of Onset   Heart failure Mother 54    ROS: no fevers or chills, productive cough, hemoptysis, dysphasia, odynophagia, melena, hematochezia, dysuria, hematuria, rash, seizure activity, orthopnea, PND, pedal edema, claudication. Remaining systems are negative.  Physical Exam: Well-developed well-nourished in no acute distress.  Skin is warm and dry.  HEENT is normal.  Neck is supple.  Chest is clear to auscultation with normal expansion.  Cardiovascular exam is regular rate and rhythm.  Abdominal exam nontender or distended. No masses palpated. Extremities show no edema. neuro grossly intact  ECG- personally reviewed  A/P  1 coronary artery disease-patient denies chest pain.  Continue statin.  He is not on aspirin given recent intracranial bleed.  2 hypertension-  3 hyperlipidemia-continue statin.  4 moderate aortic insufficiency-  5 orthostatic hypotension-  6 cerebrovascular disease-  Kirk Ruths, MD

## 2021-12-23 DIAGNOSIS — N1832 Chronic kidney disease, stage 3b: Secondary | ICD-10-CM | POA: Diagnosis not present

## 2021-12-23 DIAGNOSIS — I2510 Atherosclerotic heart disease of native coronary artery without angina pectoris: Secondary | ICD-10-CM | POA: Diagnosis not present

## 2021-12-23 DIAGNOSIS — G9340 Encephalopathy, unspecified: Secondary | ICD-10-CM | POA: Diagnosis not present

## 2021-12-23 DIAGNOSIS — F0390 Unspecified dementia without behavioral disturbance: Secondary | ICD-10-CM | POA: Diagnosis not present

## 2021-12-23 DIAGNOSIS — E039 Hypothyroidism, unspecified: Secondary | ICD-10-CM | POA: Diagnosis not present

## 2021-12-23 DIAGNOSIS — I129 Hypertensive chronic kidney disease with stage 1 through stage 4 chronic kidney disease, or unspecified chronic kidney disease: Secondary | ICD-10-CM | POA: Diagnosis not present

## 2021-12-27 DIAGNOSIS — I629 Nontraumatic intracranial hemorrhage, unspecified: Secondary | ICD-10-CM | POA: Diagnosis not present

## 2021-12-27 DIAGNOSIS — I2510 Atherosclerotic heart disease of native coronary artery without angina pectoris: Secondary | ICD-10-CM | POA: Diagnosis not present

## 2021-12-27 DIAGNOSIS — I951 Orthostatic hypotension: Secondary | ICD-10-CM | POA: Diagnosis not present

## 2021-12-27 DIAGNOSIS — E538 Deficiency of other specified B group vitamins: Secondary | ICD-10-CM | POA: Diagnosis not present

## 2021-12-27 DIAGNOSIS — G629 Polyneuropathy, unspecified: Secondary | ICD-10-CM | POA: Diagnosis not present

## 2021-12-27 DIAGNOSIS — R269 Unspecified abnormalities of gait and mobility: Secondary | ICD-10-CM | POA: Diagnosis not present

## 2021-12-27 DIAGNOSIS — M545 Low back pain, unspecified: Secondary | ICD-10-CM | POA: Diagnosis not present

## 2021-12-27 DIAGNOSIS — I10 Essential (primary) hypertension: Secondary | ICD-10-CM | POA: Diagnosis not present

## 2021-12-27 DIAGNOSIS — R627 Adult failure to thrive: Secondary | ICD-10-CM | POA: Diagnosis not present

## 2021-12-27 DIAGNOSIS — D696 Thrombocytopenia, unspecified: Secondary | ICD-10-CM | POA: Diagnosis not present

## 2021-12-27 DIAGNOSIS — F0280 Dementia in other diseases classified elsewhere without behavioral disturbance: Secondary | ICD-10-CM | POA: Diagnosis not present

## 2022-01-02 ENCOUNTER — Inpatient Hospital Stay: Payer: Medicare Other

## 2022-01-02 ENCOUNTER — Inpatient Hospital Stay: Payer: Medicare Other | Admitting: Family

## 2022-01-03 DIAGNOSIS — I2510 Atherosclerotic heart disease of native coronary artery without angina pectoris: Secondary | ICD-10-CM | POA: Diagnosis not present

## 2022-01-03 DIAGNOSIS — N1832 Chronic kidney disease, stage 3b: Secondary | ICD-10-CM | POA: Diagnosis not present

## 2022-01-03 DIAGNOSIS — I129 Hypertensive chronic kidney disease with stage 1 through stage 4 chronic kidney disease, or unspecified chronic kidney disease: Secondary | ICD-10-CM | POA: Diagnosis not present

## 2022-01-03 DIAGNOSIS — F0390 Unspecified dementia without behavioral disturbance: Secondary | ICD-10-CM | POA: Diagnosis not present

## 2022-01-03 DIAGNOSIS — E039 Hypothyroidism, unspecified: Secondary | ICD-10-CM | POA: Diagnosis not present

## 2022-01-03 DIAGNOSIS — G9340 Encephalopathy, unspecified: Secondary | ICD-10-CM | POA: Diagnosis not present

## 2022-01-04 ENCOUNTER — Other Ambulatory Visit: Payer: Self-pay

## 2022-01-04 ENCOUNTER — Emergency Department (HOSPITAL_COMMUNITY): Payer: Medicare Other

## 2022-01-04 ENCOUNTER — Ambulatory Visit: Payer: Medicare Other | Admitting: Physical Therapy

## 2022-01-04 ENCOUNTER — Ambulatory Visit: Payer: Medicare Other | Admitting: Cardiology

## 2022-01-04 ENCOUNTER — Inpatient Hospital Stay (HOSPITAL_COMMUNITY)
Admission: EM | Admit: 2022-01-04 | Discharge: 2022-01-11 | DRG: 086 | Disposition: A | Discharge status: Hospice | Payer: Medicare Other | Attending: Internal Medicine | Admitting: Internal Medicine

## 2022-01-04 DIAGNOSIS — Z7401 Bed confinement status: Secondary | ICD-10-CM

## 2022-01-04 DIAGNOSIS — D696 Thrombocytopenia, unspecified: Secondary | ICD-10-CM | POA: Diagnosis not present

## 2022-01-04 DIAGNOSIS — E785 Hyperlipidemia, unspecified: Secondary | ICD-10-CM | POA: Diagnosis present

## 2022-01-04 DIAGNOSIS — Z66 Do not resuscitate: Secondary | ICD-10-CM | POA: Diagnosis not present

## 2022-01-04 DIAGNOSIS — Z9181 History of falling: Secondary | ICD-10-CM | POA: Diagnosis not present

## 2022-01-04 DIAGNOSIS — G629 Polyneuropathy, unspecified: Secondary | ICD-10-CM | POA: Diagnosis not present

## 2022-01-04 DIAGNOSIS — I2510 Atherosclerotic heart disease of native coronary artery without angina pectoris: Secondary | ICD-10-CM | POA: Diagnosis present

## 2022-01-04 DIAGNOSIS — B370 Candidal stomatitis: Secondary | ICD-10-CM | POA: Diagnosis present

## 2022-01-04 DIAGNOSIS — Z20822 Contact with and (suspected) exposure to covid-19: Secondary | ICD-10-CM | POA: Diagnosis present

## 2022-01-04 DIAGNOSIS — M21372 Foot drop, left foot: Secondary | ICD-10-CM | POA: Diagnosis present

## 2022-01-04 DIAGNOSIS — F0781 Postconcussional syndrome: Secondary | ICD-10-CM | POA: Diagnosis present

## 2022-01-04 DIAGNOSIS — I10 Essential (primary) hypertension: Secondary | ICD-10-CM | POA: Diagnosis not present

## 2022-01-04 DIAGNOSIS — Z87891 Personal history of nicotine dependence: Secondary | ICD-10-CM

## 2022-01-04 DIAGNOSIS — F05 Delirium due to known physiological condition: Secondary | ICD-10-CM

## 2022-01-04 DIAGNOSIS — Z515 Encounter for palliative care: Secondary | ICD-10-CM

## 2022-01-04 DIAGNOSIS — E440 Moderate protein-calorie malnutrition: Secondary | ICD-10-CM | POA: Diagnosis not present

## 2022-01-04 DIAGNOSIS — I161 Hypertensive emergency: Secondary | ICD-10-CM | POA: Diagnosis present

## 2022-01-04 DIAGNOSIS — S065XAA Traumatic subdural hemorrhage with loss of consciousness status unknown, initial encounter: Secondary | ICD-10-CM

## 2022-01-04 DIAGNOSIS — E039 Hypothyroidism, unspecified: Secondary | ICD-10-CM | POA: Diagnosis present

## 2022-01-04 DIAGNOSIS — I25810 Atherosclerosis of coronary artery bypass graft(s) without angina pectoris: Secondary | ICD-10-CM | POA: Diagnosis present

## 2022-01-04 DIAGNOSIS — Z681 Body mass index (BMI) 19 or less, adult: Secondary | ICD-10-CM

## 2022-01-04 DIAGNOSIS — F0390 Unspecified dementia without behavioral disturbance: Secondary | ICD-10-CM | POA: Diagnosis present

## 2022-01-04 DIAGNOSIS — I951 Orthostatic hypotension: Secondary | ICD-10-CM | POA: Diagnosis present

## 2022-01-04 DIAGNOSIS — Z888 Allergy status to other drugs, medicaments and biological substances status: Secondary | ICD-10-CM

## 2022-01-04 DIAGNOSIS — R296 Repeated falls: Secondary | ICD-10-CM | POA: Diagnosis present

## 2022-01-04 DIAGNOSIS — I6203 Nontraumatic chronic subdural hemorrhage: Secondary | ICD-10-CM | POA: Diagnosis not present

## 2022-01-04 DIAGNOSIS — E876 Hypokalemia: Secondary | ICD-10-CM | POA: Diagnosis present

## 2022-01-04 DIAGNOSIS — N1831 Chronic kidney disease, stage 3a: Secondary | ICD-10-CM | POA: Diagnosis not present

## 2022-01-04 DIAGNOSIS — S065X0A Traumatic subdural hemorrhage without loss of consciousness, initial encounter: Principal | ICD-10-CM | POA: Diagnosis present

## 2022-01-04 DIAGNOSIS — Z7189 Other specified counseling: Secondary | ICD-10-CM | POA: Diagnosis not present

## 2022-01-04 DIAGNOSIS — Z79899 Other long term (current) drug therapy: Secondary | ICD-10-CM

## 2022-01-04 DIAGNOSIS — E860 Dehydration: Secondary | ICD-10-CM | POA: Diagnosis present

## 2022-01-04 DIAGNOSIS — I6381 Other cerebral infarction due to occlusion or stenosis of small artery: Secondary | ICD-10-CM | POA: Diagnosis not present

## 2022-01-04 DIAGNOSIS — R627 Adult failure to thrive: Secondary | ICD-10-CM | POA: Diagnosis not present

## 2022-01-04 DIAGNOSIS — W1830XA Fall on same level, unspecified, initial encounter: Secondary | ICD-10-CM | POA: Diagnosis present

## 2022-01-04 DIAGNOSIS — S065XAD Traumatic subdural hemorrhage with loss of consciousness status unknown, subsequent encounter: Secondary | ICD-10-CM | POA: Diagnosis not present

## 2022-01-04 DIAGNOSIS — R220 Localized swelling, mass and lump, head: Secondary | ICD-10-CM | POA: Diagnosis not present

## 2022-01-04 DIAGNOSIS — R338 Other retention of urine: Secondary | ICD-10-CM | POA: Diagnosis present

## 2022-01-04 DIAGNOSIS — I129 Hypertensive chronic kidney disease with stage 1 through stage 4 chronic kidney disease, or unspecified chronic kidney disease: Secondary | ICD-10-CM | POA: Diagnosis present

## 2022-01-04 DIAGNOSIS — I6200 Nontraumatic subdural hemorrhage, unspecified: Secondary | ICD-10-CM | POA: Diagnosis not present

## 2022-01-04 DIAGNOSIS — R0602 Shortness of breath: Secondary | ICD-10-CM | POA: Diagnosis not present

## 2022-01-04 DIAGNOSIS — N401 Enlarged prostate with lower urinary tract symptoms: Secondary | ICD-10-CM | POA: Diagnosis not present

## 2022-01-04 DIAGNOSIS — R2681 Unsteadiness on feet: Secondary | ICD-10-CM | POA: Diagnosis present

## 2022-01-04 DIAGNOSIS — Z8249 Family history of ischemic heart disease and other diseases of the circulatory system: Secondary | ICD-10-CM

## 2022-01-04 DIAGNOSIS — M21371 Foot drop, right foot: Secondary | ICD-10-CM | POA: Diagnosis present

## 2022-01-04 DIAGNOSIS — Z955 Presence of coronary angioplasty implant and graft: Secondary | ICD-10-CM

## 2022-01-04 DIAGNOSIS — F03911 Unspecified dementia, unspecified severity, with agitation: Secondary | ICD-10-CM | POA: Diagnosis not present

## 2022-01-04 DIAGNOSIS — Z79890 Hormone replacement therapy (postmenopausal): Secondary | ICD-10-CM

## 2022-01-04 DIAGNOSIS — R262 Difficulty in walking, not elsewhere classified: Secondary | ICD-10-CM | POA: Diagnosis not present

## 2022-01-04 DIAGNOSIS — R339 Retention of urine, unspecified: Secondary | ICD-10-CM | POA: Diagnosis not present

## 2022-01-04 DIAGNOSIS — R531 Weakness: Secondary | ICD-10-CM | POA: Diagnosis not present

## 2022-01-04 DIAGNOSIS — I6202 Nontraumatic subacute subdural hemorrhage: Secondary | ICD-10-CM | POA: Diagnosis not present

## 2022-01-04 DIAGNOSIS — R4182 Altered mental status, unspecified: Secondary | ICD-10-CM | POA: Diagnosis not present

## 2022-01-04 DIAGNOSIS — I739 Peripheral vascular disease, unspecified: Secondary | ICD-10-CM | POA: Diagnosis not present

## 2022-01-04 DIAGNOSIS — R4587 Impulsiveness: Secondary | ICD-10-CM | POA: Diagnosis present

## 2022-01-04 LAB — URINALYSIS, ROUTINE W REFLEX MICROSCOPIC
Bacteria, UA: NONE SEEN
Bilirubin Urine: NEGATIVE
Glucose, UA: NEGATIVE mg/dL
Ketones, ur: 5 mg/dL — AB
Leukocytes,Ua: NEGATIVE
Nitrite: NEGATIVE
Protein, ur: NEGATIVE mg/dL
RBC / HPF: >50 RBC/hpf — ABNORMAL HIGH (ref 0–5)
Specific Gravity, Urine: 1.018 (ref 1.005–1.030)
pH: 6 (ref 5.0–8.0)

## 2022-01-04 LAB — COMPREHENSIVE METABOLIC PANEL
ALT: 15 U/L (ref 0–44)
AST: 17 U/L (ref 15–41)
Albumin: 3.7 g/dL (ref 3.5–5.0)
Alkaline Phosphatase: 55 U/L (ref 38–126)
Anion gap: 9 (ref 5–15)
BUN: 10 mg/dL (ref 8–23)
CO2: 25 mmol/L (ref 22–32)
Calcium: 8.7 mg/dL — ABNORMAL LOW (ref 8.9–10.3)
Chloride: 104 mmol/L (ref 98–111)
Creatinine, Ser: 1 mg/dL (ref 0.61–1.24)
GFR, Estimated: >60 mL/min (ref >60)
Glucose, Bld: 105 mg/dL — ABNORMAL HIGH (ref 70–99)
Potassium: 3.4 mmol/L — ABNORMAL LOW (ref 3.5–5.1)
Sodium: 138 mmol/L (ref 135–145)
Total Bilirubin: 1.3 mg/dL — ABNORMAL HIGH (ref 0.3–1.2)
Total Protein: 6.3 g/dL — ABNORMAL LOW (ref 6.5–8.1)

## 2022-01-04 LAB — CBC WITH DIFFERENTIAL/PLATELET
Abs Immature Granulocytes: 0.21 10*3/uL — ABNORMAL HIGH (ref 0.00–0.07)
Basophils Absolute: 0 10*3/uL (ref 0.0–0.1)
Basophils Relative: 0 %
Eosinophils Absolute: 0.1 10*3/uL (ref 0.0–0.5)
Eosinophils Relative: 1 %
HCT: 42.4 % (ref 39.0–52.0)
Hemoglobin: 14.1 g/dL (ref 13.0–17.0)
Immature Granulocytes: 3 %
Lymphocytes Relative: 21 %
Lymphs Abs: 1.5 10*3/uL (ref 0.7–4.0)
MCH: 32.3 pg (ref 26.0–34.0)
MCHC: 33.3 g/dL (ref 30.0–36.0)
MCV: 97.2 fL (ref 80.0–100.0)
Monocytes Absolute: 2 10*3/uL — ABNORMAL HIGH (ref 0.1–1.0)
Monocytes Relative: 28 %
Neutro Abs: 3.5 10*3/uL (ref 1.7–7.7)
Neutrophils Relative %: 47 %
Platelets: 72 10*3/uL — ABNORMAL LOW (ref 150–400)
RBC: 4.36 MIL/uL (ref 4.22–5.81)
RDW: 12.8 % (ref 11.5–15.5)
WBC: 7.2 10*3/uL (ref 4.0–10.5)
nRBC: 0 % (ref 0.0–0.2)

## 2022-01-04 LAB — TROPONIN I (HIGH SENSITIVITY)
Troponin I (High Sensitivity): 20 ng/L — ABNORMAL HIGH (ref <18)
Troponin I (High Sensitivity): 18 ng/L — ABNORMAL HIGH (ref <18)

## 2022-01-04 LAB — RESP PANEL BY RT-PCR (FLU A&B, COVID) ARPGX2
Influenza A by PCR: NEGATIVE
Influenza B by PCR: NEGATIVE
SARS Coronavirus 2 by RT PCR: NEGATIVE

## 2022-01-04 LAB — CBG MONITORING, ED: Glucose-Capillary: 92 mg/dL (ref 70–99)

## 2022-01-04 MED ORDER — POLYETHYLENE GLYCOL 3350 17 GM/SCOOP PO POWD
17.0000 g | Freq: Every day | ORAL | Status: DC | PRN
Start: 2022-01-04 — End: 2022-01-07

## 2022-01-04 MED ORDER — AMLODIPINE BESYLATE 5 MG PO TABS
5.0000 mg | ORAL_TABLET | Freq: Every day | ORAL | Status: DC
  Administered 2022-01-04: 5 mg via ORAL
  Filled 2022-01-04: qty 1

## 2022-01-04 MED ORDER — HYDRALAZINE HCL 20 MG/ML IJ SOLN: 5.0000 mg | Freq: Four times a day (QID) | INTRAMUSCULAR | Status: DC | PRN

## 2022-01-04 MED ORDER — SODIUM CHLORIDE 0.9 % IV SOLN
INTRAVENOUS | Status: AC
Start: 2022-01-04 — End: 2022-01-05

## 2022-01-04 MED ORDER — HYDRALAZINE HCL 10 MG PO TABS
10.0000 mg | ORAL_TABLET | ORAL | Status: AC
  Administered 2022-01-04: 10 mg via ORAL
  Filled 2022-01-04: qty 1

## 2022-01-04 MED ORDER — DIVALPROEX SODIUM 250 MG PO DR TAB
250.0000 mg | DELAYED_RELEASE_TABLET | Freq: Two times a day (BID) | ORAL | Status: DC
  Administered 2022-01-05 (×2): 250 mg via ORAL
  Filled 2022-01-04 (×2): qty 1

## 2022-01-04 MED ORDER — MIRTAZAPINE 15 MG PO TABS
15.0000 mg | ORAL_TABLET | Freq: Every day | ORAL | Status: DC
  Administered 2022-01-04 – 2022-01-07 (×4): 15 mg via ORAL
  Filled 2022-01-04 (×4): qty 1

## 2022-01-04 MED ORDER — ACETAMINOPHEN 325 MG PO TABS
650.0000 mg | ORAL_TABLET | ORAL | Status: DC | PRN
  Administered 2022-01-06 – 2022-01-07 (×4): 650 mg via ORAL
  Filled 2022-01-04 (×4): qty 2

## 2022-01-04 MED ORDER — PANTOPRAZOLE SODIUM 40 MG PO TBEC
40.0000 mg | DELAYED_RELEASE_TABLET | Freq: Every day | ORAL | Status: DC
  Administered 2022-01-05 – 2022-01-07 (×2): 40 mg via ORAL
  Filled 2022-01-04 (×2): qty 1

## 2022-01-04 MED ORDER — FINASTERIDE 5 MG PO TABS
5.0000 mg | ORAL_TABLET | Freq: Every day | ORAL | Status: DC
Start: 2022-01-04 — End: 2022-01-09
  Administered 2022-01-05 – 2022-01-07 (×3): 5 mg via ORAL
  Filled 2022-01-04 (×4): qty 1

## 2022-01-04 MED ORDER — POTASSIUM CHLORIDE CRYS ER 20 MEQ PO TBCR
40.0000 meq | EXTENDED_RELEASE_TABLET | Freq: Once | ORAL | Status: DC
  Filled 2022-01-04: qty 2

## 2022-01-04 MED ORDER — DIPHENHYDRAMINE-APAP (SLEEP) 25-500 MG PO TABS: 1.0000 | ORAL_TABLET | Freq: Every evening | ORAL | Status: DC | PRN

## 2022-01-04 MED ORDER — EZETIMIBE 10 MG PO TABS
10.0000 mg | ORAL_TABLET | Freq: Every day | ORAL | Status: DC
  Administered 2022-01-04 – 2022-01-07 (×4): 10 mg via ORAL
  Filled 2022-01-04 (×5): qty 1

## 2022-01-04 MED ORDER — BUSPIRONE HCL 10 MG PO TABS: 5.0000 mg | ORAL_TABLET | Freq: Two times a day (BID) | ORAL | Status: DC

## 2022-01-04 MED ORDER — ACETAMINOPHEN 650 MG RE SUPP
650.0000 mg | RECTAL | Status: DC | PRN
  Administered 2022-01-08: 650 mg via RECTAL
  Filled 2022-01-04: qty 1

## 2022-01-04 MED ORDER — LABETALOL HCL 5 MG/ML IV SOLN
10.0000 mg | INTRAVENOUS | Status: AC
  Administered 2022-01-04: 10 mg via INTRAVENOUS
  Filled 2022-01-04: qty 4

## 2022-01-04 MED ORDER — HYDRALAZINE HCL 20 MG/ML IJ SOLN
2.0000 mg | Freq: Four times a day (QID) | INTRAMUSCULAR | Status: DC | PRN
  Administered 2022-01-04: 2 mg via INTRAVENOUS
  Filled 2022-01-04: qty 1

## 2022-01-04 MED ORDER — OXYCODONE-ACETAMINOPHEN 5-325 MG PO TABS
1.0000 | ORAL_TABLET | Freq: Four times a day (QID) | ORAL | Status: DC | PRN
  Administered 2022-01-05 – 2022-01-08 (×6): 1 via ORAL
  Filled 2022-01-04 (×6): qty 1

## 2022-01-04 MED ORDER — MEMANTINE HCL 10 MG PO TABS
10.0000 mg | ORAL_TABLET | Freq: Two times a day (BID) | ORAL | Status: DC
  Administered 2022-01-05 – 2022-01-07 (×6): 10 mg via ORAL
  Filled 2022-01-04 (×8): qty 1

## 2022-01-04 MED ORDER — LEVOTHYROXINE SODIUM 112 MCG PO TABS
112.0000 ug | ORAL_TABLET | Freq: Every day | ORAL | Status: DC
  Administered 2022-01-04 – 2022-01-08 (×3): 112 ug via ORAL
  Filled 2022-01-04 (×3): qty 1

## 2022-01-04 MED ORDER — ACETAMINOPHEN 160 MG/5ML PO SOLN: 650.0000 mg | ORAL | Status: DC | PRN

## 2022-01-04 NOTE — H&P (Signed)
History and Physical    [place] H Freimark TJQ:[id number] DOB: [birth year] DOA: 01/04/2022  PCP: Ginger Organ., MD (Confirm with patient/family/NH records and if not entered, this has to be entered at [facility] point of entry) Patient coming from: Home  I have personally briefly reviewed patient's old medical records in [place]  Chief Complaint: Feeling ok  HPI: [place] H Closs is a 88 y.o. male with medical history significant of recent subarachnoid hemorrhage after a fall, orthostatic hypotension, poorly controlled HTN, frequent falls, chronic urinary retention probably secondary to BPH, dementia with behavioral symptoms, chronic thrombocytopenia, chronic peripheral neuropathy, CAD, sent by family member for evaluation of increasing weakness and poor appetite.  Patient was recently hospitalized for a fall and SAH, which was stabilized and patient discharged to inpatient rehab.  Patient was released from rehab 2 weeks ago.  During the rehab stay, it was found patient has poorly controlled orthostatic hypotension and patient was restarted on fludrocortisone (which was discontinued on last admission due to hypotension emergency).  As per wife, the plan was that patient was also started on Flomax and appeared at recommendation of Flomax and further cortisone somehow stabilized his blood pressure.  Family also reported that the patient started to use walker after discharge from rehab however patient continued to forget about using the walker.  Which happened on last Saturday, patient fell down in the bathroom and hit his head, but no LOC.  Since then, wife found the patient cardiology become more lethargic and sleepy and more unsteady on his feet.  His appetite affected significantly as well, poor food and water intake for last 3 days and became so dehydrated.  Denies any new weakness or numbness of any of the limbs.  EMS arrived and found the patient SBP> 200.  ED Course: Blood pressure  significant elevated.  CT head without contrast showed acute on subacute-chronic bilateral frontoparietal subdural hematoma.  Review of Systems: As per HPI otherwise 14 point review of systems negative.    Past Medical History:  Diagnosis Date   Bilateral foot-drop 12/17/2019   BPH (benign prostatic hypertrophy)    Diverticulosis    ED (erectile dysfunction)    Food impaction of esophagus w/mild gastritis 06/28/2021   Gait abnormality 12/17/2019   GERD (gastroesophageal reflux disease)    Gout    H/O multiple concussions--as teenager    Hepatitis    hx hepatitis after mono as teenager   History of kidney stones    History of skin cancer    Hyperlipidemia    Hypertension    Hypothyroidism    IHD (ischemic heart disease)    Remote CABG in 1997   Memory disorder 12/17/2019   MI, acute, non ST segment elevation ([place]) 2010   s/p cath with occluded SVG to PD that fills by collaterals and the remainder of his revasculsarization is satisfactory. "the first time they did the stent , the second time they did the graft 8 vessels"   Nocturia    Schatzki's ring--mild    Thrombocytopenia ([place]) 11/19/2018   Tremor, essential 12/17/2019   Urinary frequency     Past Surgical History:  Procedure Laterality Date   APPENDECTOMY     BALLOON DILATION N/A 03/25/2019   Procedure: BALLOON DILATION;  Surgeon: Otis Brace, MD;  Location: [facility];  Service: Gastroenterology;  Laterality: N/A;   CARDIAC CATHETERIZATION  09/08/2008   NORMAL. EF 60%; Occluded SVG to PD that fills by collaterals and the remainder of his revascularization  is satisfactory. he is managed medically.    CORONARY ARTERY BYPASS GRAFT  1997   CABG x 8   CORONARY STENT INTERVENTION N/A 11/18/2018   Procedure: CORONARY STENT INTERVENTION;  Surgeon: Sherren Mocha, MD;  Location: [facility];  Service: Cardiovascular;  Laterality: N/A;   CYSTOSCOPY WITH INSERTION OF UROLIFT N/A 03/19/2015   Procedure: CYSTOSCOPY  WITH INSERTION OF FOUR UROLIFTS;  Surgeon: Carolan Clines, MD;  Location: [facility];  Service: Urology;  Laterality: N/A;   ESOPHAGOGASTRODUODENOSCOPY (EGD) WITH PROPOFOL N/A 02/12/2017   Procedure: ESOPHAGOGASTRODUODENOSCOPY (EGD) WITH PROPOFOL;  Surgeon: Otis Brace, MD;  Location: [facility];  Service: Gastroenterology;  Laterality: N/A;   ESOPHAGOGASTRODUODENOSCOPY (EGD) WITH PROPOFOL N/A 07/13/2018   Procedure: ESOPHAGOGASTRODUODENOSCOPY (EGD) WITH PROPOFOL;  Surgeon: Clarene Essex, MD;  Location: [facility];  Service: Endoscopy;  Laterality: N/A;   ESOPHAGOGASTRODUODENOSCOPY (EGD) WITH PROPOFOL N/A 03/25/2019   Procedure: ESOPHAGOGASTRODUODENOSCOPY (EGD) WITH PROPOFOL;  Surgeon: Otis Brace, MD;  [facility]: [facility];  Service: Gastroenterology;  Laterality: N/A;   ESOPHAGOGASTRODUODENOSCOPY (EGD) WITH PROPOFOL N/A 06/28/2021   Procedure: ESOPHAGOGASTRODUODENOSCOPY (EGD) WITH PROPOFOL;  Surgeon: Wilford Corner, MD;  Location: [facility];  Service: Endoscopy;  Laterality: N/A;   FOREIGN BODY REMOVAL N/A 07/13/2018   Procedure: FOREIGN BODY REMOVAL;  Surgeon: Clarene Essex, MD;  Location: [facility];  Service: Endoscopy;  Laterality: N/A;   IMPACTION REMOVAL  06/28/2021   Procedure: IMPACTION REMOVAL;  Surgeon: Wilford Corner, MD;  Location: [facility];  Service: Endoscopy;;   LEFT HEART CATH AND CORS/GRAFTS ANGIOGRAPHY N/A 11/18/2018   Procedure: LEFT HEART CATH AND CORS/GRAFTS ANGIOGRAPHY;  Surgeon: Sherren Mocha, MD;  Location: [name];  Service: Cardiovascular;  Laterality: N/A;     reports that he quit smoking about 63 years ago. His smoking use included cigarettes. He has a 15.00 pack-year smoking history. He has never used smokeless tobacco. He reports current alcohol use. He reports that he does not use drugs.  Allergies  Allergen Reactions   Flomax [Tamsulosin] Other (See Comments)    Hypotension, dizziness   Pravachol Other (See Comments)     Unknown reaction    Triazolam Other (See Comments)    "Makes me crazy"    Family History  Problem Relation Age of Onset   Heart failure Mother 72     Prior to Admission medications   Medication Sig Start Date End Date Taking? Authorizing Provider  diphenhydramine-acetaminophen (TYLENOL PM) 25-500 MG TABS tablet Take 1 tablet by mouth at bedtime as needed (Sleep).   Yes [provider]  divalproex (DEPAKOTE) 250 MG DR tablet Take 1 tablet (250 mg total) by mouth 2 (two) times daily. 12/21/21  Yes Love, Ivan Anchors, PA-C  ezetimibe (ZETIA) 10 MG tablet Take 1 tablet (10 mg total) by mouth daily. 12/21/21  Yes Love, Ivan Anchors, PA-C  finasteride (PROSCAR) 5 MG tablet Take 1 tablet (5 mg total) by mouth daily. 12/21/21  Yes Love, Ivan Anchors, PA-C  fludrocortisone (FLORINEF) 0.1 MG tablet Take 0.5 tablets (0.05 mg total) by mouth daily. 12/21/21  Yes Love, Ivan Anchors, PA-C  levothyroxine (SYNTHROID) 112 MCG tablet Take 1 tablet (112 mcg total) by mouth daily. 12/21/21  Yes Love, Ivan Anchors, PA-C  memantine (NAMENDA) 10 MG tablet Take 1 tablet (10 mg total) by mouth at bedtime. Patient taking differently: Take 10 mg by mouth 2 (two) times daily. 12/21/21 12/16/22 Yes Love, Ivan Anchors, PA-C  mirtazapine (REMERON) 15 MG tablet Take 1 tablet (15 mg total) by mouth  at bedtime. 12/21/21  Yes Love, Ivan Anchors, PA-C  Multiple Vitamin (MULTIVITAMIN WITH MINERALS) TABS tablet Take 1 tablet by mouth daily. 12/21/21  Yes Love, Ivan Anchors, PA-C  nitroGLYCERIN (NITROSTAT) 0.4 MG SL tablet Place 1 tablet (0.4 mg total) under the tongue every 5 (five) minutes as needed. Patient taking differently: Place 0.4 mg under the tongue every 5 (five) minutes as needed for chest pain. 07/03/19  Yes Burtis Junes, NP  oxyCODONE-acetaminophen (PERCOCET/ROXICET) 5-325 MG tablet Take 1 tablet by mouth every 6 (six) hours as needed for severe pain. 12/09/21  Yes Dahal, Marlowe Aschoff, MD  pantoprazole (PROTONIX) 40 MG tablet Take 1 tablet (40 mg total) by  mouth daily before breakfast. 12/21/21 12/21/22 Yes Love, Ivan Anchors, PA-C  polyethylene glycol powder (GLYCOLAX/MIRALAX) 17 GM/SCOOP powder Take 17 g by mouth 2 (two) times daily. Patient taking differently: Take 17 g by mouth daily as needed for mild constipation or moderate constipation. 12/22/21  Yes Love, Ivan Anchors, PA-C  vitamin D3 (CHOLECALCIFEROL) 25 MCG tablet Take 1 tablet (1,000 Units total) by mouth daily. 12/21/21  Yes Love, Ivan Anchors, PA-C  busPIRone (BUSPAR) 5 MG tablet Take 1 tablet (5 mg total) by mouth 2 (two) times daily. Patient not taking: Reported on 01/04/2022 12/21/21   Love, Ivan Anchors, PA-C  nystatin (MYCOSTATIN) 100000 UNIT/ML suspension Take 5 mLs (500,000 Units total) by mouth 4 (four) times daily. Patient not taking: Reported on 01/04/2022 12/21/21   Love, Ivan Anchors, PA-C  valACYclovir (VALTREX) 1000 MG tablet Take by mouth. Patient not taking: Reported on 01/04/2022 12/27/21   [provider]    Physical Exam: Vitals:   01/04/22 1516 01/04/22 1600 01/04/22 1607 01/04/22 1730  BP: (!) 176/70 (!) 168/65  (!) 155/75  Pulse:  65  74  Resp:  18  12  Temp:   98.5 F (36.9 C)   TempSrc:   Oral   SpO2:  95%  100%  Weight:      Height:        Constitutional: NAD, calm, comfortable Vitals:   01/04/22 1516 01/04/22 1600 01/04/22 1607 01/04/22 1730  BP: (!) 176/70 (!) 168/65  (!) 155/75  Pulse:  65  74  Resp:  18  12  Temp:   98.5 F (36.9 C)   TempSrc:   Oral   SpO2:  95%  100%  Weight:      Height:       Eyes: PERRL, lids and conjunctivae normal ENMT: Mucous membranes are dry. Posterior pharynx clear of any exudate or lesions.Normal dentition.  Neck: normal, supple, no masses, no thyromegaly Respiratory: clear to auscultation bilaterally, no wheezing, no crackles. Normal respiratory effort. No accessory muscle use.  Cardiovascular: Regular rate and rhythm, no murmurs / rubs / gallops. No extremity edema. 2+ pedal pulses. No carotid bruits.  Abdomen: no tenderness,  no masses palpated. No hepatosplenomegaly. Bowel sounds positive.  Musculoskeletal: no clubbing / cyanosis. No joint deformity upper and lower extremities. Good ROM, no contractures. Normal muscle tone.  Skin: no rashes, lesions, ulcers. No induration Neurologic: CN 2-12 grossly intact. Sensation intact, DTR normal. Strength 5/5 in all 4.  Psychiatric: Normal judgment and insight. Alert and oriented x 3. Normal mood.    Labs on Admission: I have personally reviewed following labs and imaging studies  CBC: Recent Labs  Lab 01/04/22 1225  WBC 7.2  NEUTROABS 3.5  HGB 14.1  HCT 42.4  MCV 97.2  PLT 72*   Basic Metabolic Panel: Recent Labs  Lab 01/04/22 1225  NA 138  K 3.4*  CL 104  CO2 25  GLUCOSE 105*  BUN 10  CREATININE 1.00  CALCIUM 8.7*   GFR: Estimated Creatinine Clearance: 42.1 mL/min (by C-G formula based on SCr of 1 mg/dL). Liver Function Tests: Recent Labs  Lab 01/04/22 1225  AST 17  ALT 15  ALKPHOS 55  BILITOT 1.3*  PROT 6.3*  ALBUMIN 3.7   No results for input(s): "LIPASE", "AMYLASE" in the last 168 hours. No results for input(s): "AMMONIA" in the last 168 hours. Coagulation Profile: No results for input(s): "INR", "PROTIME" in the last 168 hours. Cardiac Enzymes: No results for input(s): "CKTOTAL", "CKMB", "CKMBINDEX", "TROPONINI" in the last 168 hours. BNP (last 3 results) No results for input(s): "PROBNP" in the last 8760 hours. HbA1C: No results for input(s): "HGBA1C" in the last 72 hours. CBG: Recent Labs  Lab 01/04/22 1228  GLUCAP 92   Lipid Profile: No results for input(s): "CHOL", "HDL", "LDLCALC", "TRIG", "CHOLHDL", "LDLDIRECT" in the last 72 hours. Thyroid Function Tests: No results for input(s): "TSH", "T4TOTAL", "FREET4", "T3FREE", "THYROIDAB" in the last 72 hours. Anemia Panel: No results for input(s): "VITAMINB12", "FOLATE", "FERRITIN", "TIBC", "IRON", "RETICCTPCT" in the last 72 hours. Urine analysis:    Component Value  Date/Time   COLORURINE AMBER (A) 01/04/2022 1424   APPEARANCEUR CLEAR 01/04/2022 1424   LABSPEC 1.018 01/04/2022 1424   PHURINE 6.0 01/04/2022 1424   GLUCOSEU NEGATIVE 01/04/2022 1424   HGBUR SMALL (A) 01/04/2022 1424   BILIRUBINUR NEGATIVE 01/04/2022 1424   KETONESUR 5 (A) 01/04/2022 1424   PROTEINUR NEGATIVE 01/04/2022 1424   UROBILINOGEN 1.0 03/20/2015 1813   NITRITE NEGATIVE 01/04/2022 1424   LEUKOCYTESUR NEGATIVE 01/04/2022 1424    Radiological Exams on Admission: CT Head Wo Contrast  Result Date: 01/04/2022 CLINICAL DATA:  Head trauma, minor (Age >= 65y) EXAM: CT HEAD WITHOUT CONTRAST TECHNIQUE: Contiguous axial images were obtained from the base of the skull through the vertex without intravenous contrast. RADIATION DOSE REDUCTION: This exam was performed according to the departmental dose-optimization program which includes automated exposure control, adjustment of the mA and/or kV according to patient size and/or use of iterative reconstruction technique. COMPARISON:  CT head 12/11/2021. FINDINGS: Brain: Mixed density subdural hemorrhages bilaterally. The left-sided subdural hemorrhage measures up to approximately 10 mm (previously measuring up to 15 mm when remeasured on the prior). This subdural collection contains hyperdense blood products layering posteriorly, new from the prior. The right subdural hemorrhage measures up to 1.2 cm in thickness (previously approximately 8 mm in thickness when remeasured on the prior. This collection also has hyperdense blood products which are new in comparison to the prior. Trace rightward midline shift is similar. No evidence of acute large vascular territory infarct, mass lesion, or hydrocephalus. Remote lacunar infarct in the right basal ganglia and patchy chronic microvascular ischemic disease. Vascular: No hyperdense vessel identified. Skull: No acute fracture. Sinuses/Orbits: Visualized sinuses are clear. No acute orbital findings. Other: No  mastoid effusions. IMPRESSION: Increased size of an 8 mm thick right subdural hematoma and decreased size of a 10 mm thick left mixed density bilateral cerebral convexity subdural hematoma. Both of these hematomas have new hyperdense blood products, compatible with acute/recent hemorrhage into the collections. Recommend short interval follow-up imaging to exclude progression. Trace rightward midline shift is similar. Findings discussed with Dr. Philip Aspen at 1:54 PM via telephone. Electronically Signed   By: Margaretha Sheffield M.D.   On: 01/04/2022 13:57   DG Chest 1 View  Result Date: 01/04/2022 CLINICAL DATA:  Generalized weakness, rule out pneumonia. EXAM: CHEST  1 VIEW COMPARISON:  Chest radiograph 11/07/2021 FINDINGS: Median sternotomy wires and mediastinal surgical clips are again seen. The cardiomediastinal silhouette is stable. There is no focal consolidation or pulmonary edema. There is no pleural effusion or pneumothorax There is no acute osseous abnormality. IMPRESSION: No radiographic evidence of acute cardiopulmonary process. Electronically Signed   By: Valetta Mole M.D.   On: 01/04/2022 12:28    EKG: Independently reviewed.  Sinus rhythm, no acute ST changes.  No PR or QTc interval changes.  Assessment/Plan Principal Problem:   Subdural hematoma ([place])  (please populate well all problems here in Problem List. (For example, if patient is on BP meds at home and you resume or decide to hold them, it is a problem that needs to be her. Same for CAD, COPD, HLD and so on)  Acute on subacute-chronic subdural hematoma -Secondary to frequent falls, including most recent one about 6 days ago. -As per neurosurgery, not a candidate for surgical intervention given his baseline functional status and comorbidities -We will repeat CT head tomorrow to document stabilization of subdural hematoma -Immunized control BP 130/80, but appears to be will be challenging given the history of coexistence of  orthostatic hypotension and uncontrolled BP.  We will discontinue fludrocortisone, which is discussed with patient and his family, both of them agreed with the plan. -As needed IV hydralazine -Start amlodipine 5 mg daily -Long discussion with patient and his wife regarding the use of fludrocortisone, appears that fludrocortisone no longer appropriate choice for the patient.  May consider short acting agent such as midodrine.  Wife reported patient has had poor response to compression stocking  Acute on chronic urinary retention -Also observed on last admission, solution was in and out cath.  However wife reported the patient has had significant urethral bleeding and request Foley.  For now, we will do Purewick and bladder scan, and decide whether to put Foley tomorrow.  HTN emergency -Discontinue fludrocortisone -Received IV labetalol and IV hydralazine in the ED, start as needed hydralazine and start amlodipine 5 mg daily, aiming at BP control 130/80.  Acute on chronic ambulation dysfunction -PT evaluation tomorrow  BPH -Continue Proscar -Not tolerating Flomax  Chronic thrombocytopenia -Stable  DVT prophylaxis: SCD Code Status: Full code Family Communication: Wife at bedside Disposition Plan: Expect less than 2 midnight hospital stay Consults called: Neurosurgery Admission status: Telemetry observation   Lequita Halt MD [facility] Pager [phone]  01/04/2022, 7:10 PM

## 2022-01-04 NOTE — ED Notes (Signed)
Bladder Scan showed 359ms.

## 2022-01-04 NOTE — ED Provider Notes (Signed)
[facility] EMERGENCY DEPARTMENT Provider Note   CSN: [id number] Arrival date & time: 01/04/22  1131     History  Chief Complaint  Patient presents with   Failure To Thrive    Patrick [name] is a 88 y.o. male.  88 year old male with a history of dementia, ICH, thrombocytopenia, BPH, CAD status post CABG, and hypothyroidism who presents emergency department with generalized weakness.  [name] states that he was discharged from rehab home 1 week ago.  States that 4 days ago he started experiencing generalized weakness.  Says that usually can ambulate with a walker but that at this time he has been bedbound due to gradual weakness.  Says that he sometimes will have delayed responses and will stare off in space.  Reports that he has been complaining of increased urinary frequency recently.  Says that his urine has also been foul-smelling.  Reports that he did have a fall 6 days ago where he went up to go to the restroom and fell backwards.  Did not hit his head but does not report any LOC.  No focal weakness or numbness since that is new.  No fevers or cough.  No sick contacts.    Past Medical History:  Diagnosis Date   Bilateral foot-drop 12/17/2019   BPH (benign prostatic hypertrophy)    Diverticulosis    ED (erectile dysfunction)    Food impaction of esophagus w/mild gastritis 06/28/2021   Gait abnormality 12/17/2019   GERD (gastroesophageal reflux disease)    Gout    H/O multiple concussions--as teenager    Hepatitis    hx hepatitis after mono as teenager   History of kidney stones    History of skin cancer    Hyperlipidemia    Hypertension    Hypothyroidism    IHD (ischemic heart disease)    Remote CABG in 1997   Memory disorder 12/17/2019   MI, acute, non ST segment elevation ([place]) 2010   s/p cath with occluded SVG to PD that fills by collaterals and the remainder of his revasculsarization is satisfactory. "the first time they did the stent ,  the second time they did the graft 8 vessels"   Nocturia    Schatzki's ring--mild    Thrombocytopenia ([place]) 11/19/2018   Tremor, essential 12/17/2019   Urinary frequency        Home Medications Prior to Admission medications   Medication Sig Start Date End Date Taking? Authorizing Provider  diphenhydramine-acetaminophen (TYLENOL PM) 25-500 MG TABS tablet Take 1 tablet by mouth at bedtime as needed (Sleep).   Yes [provider]  divalproex (DEPAKOTE) 250 MG DR tablet Take 1 tablet (250 mg total) by mouth 2 (two) times daily. 12/21/21  Yes Love, Ivan Anchors, PA-C  ezetimibe (ZETIA) 10 MG tablet Take 1 tablet (10 mg total) by mouth daily. 12/21/21  Yes Love, Ivan Anchors, PA-C  finasteride (PROSCAR) 5 MG tablet Take 1 tablet (5 mg total) by mouth daily. 12/21/21  Yes Love, Ivan Anchors, PA-C  fludrocortisone (FLORINEF) 0.1 MG tablet Take 0.5 tablets (0.05 mg total) by mouth daily. 12/21/21  Yes Love, Ivan Anchors, PA-C  levothyroxine (SYNTHROID) 112 MCG tablet Take 1 tablet (112 mcg total) by mouth daily. 12/21/21  Yes Love, Ivan Anchors, PA-C  memantine (NAMENDA) 10 MG tablet Take 1 tablet (10 mg total) by mouth at bedtime. Patient taking differently: Take 10 mg by mouth 2 (two) times daily. 12/21/21 12/16/22 Yes Love, Ivan Anchors, PA-C  mirtazapine (REMERON) 15  MG tablet Take 1 tablet (15 mg total) by mouth at bedtime. 12/21/21  Yes Love, Ivan Anchors, PA-C  Multiple Vitamin (MULTIVITAMIN WITH MINERALS) TABS tablet Take 1 tablet by mouth daily. 12/21/21  Yes Love, Ivan Anchors, PA-C  nitroGLYCERIN (NITROSTAT) 0.4 MG SL tablet Place 1 tablet (0.4 mg total) under the tongue every 5 (five) minutes as needed. Patient taking differently: Place 0.4 mg under the tongue every 5 (five) minutes as needed for chest pain. 07/03/19  Yes Burtis Junes, NP  oxyCODONE-acetaminophen (PERCOCET/ROXICET) 5-325 MG tablet Take 1 tablet by mouth every 6 (six) hours as needed for severe pain. 12/09/21  Yes Dahal, Marlowe Aschoff, MD  pantoprazole (PROTONIX) 40  MG tablet Take 1 tablet (40 mg total) by mouth daily before breakfast. 12/21/21 12/21/22 Yes Love, Ivan Anchors, PA-C  polyethylene glycol powder (GLYCOLAX/MIRALAX) 17 GM/SCOOP powder Take 17 g by mouth 2 (two) times daily. Patient taking differently: Take 17 g by mouth daily as needed for mild constipation or moderate constipation. 12/22/21  Yes Love, Ivan Anchors, PA-C  vitamin D3 (CHOLECALCIFEROL) 25 MCG tablet Take 1 tablet (1,000 Units total) by mouth daily. 12/21/21  Yes Love, Ivan Anchors, PA-C  busPIRone (BUSPAR) 5 MG tablet Take 1 tablet (5 mg total) by mouth 2 (two) times daily. Patient not taking: Reported on 01/04/2022 12/21/21   Love, Ivan Anchors, PA-C  nystatin (MYCOSTATIN) 100000 UNIT/ML suspension Take 5 mLs (500,000 Units total) by mouth 4 (four) times daily. Patient not taking: Reported on 01/04/2022 12/21/21   Love, Ivan Anchors, PA-C  valACYclovir (VALTREX) 1000 MG tablet Take by mouth. Patient not taking: Reported on 01/04/2022 12/27/21   [provider]      Allergies    Flomax [tamsulosin], Pravachol, and Triazolam    Review of Systems   Review of Systems  Physical Exam Updated Vital Signs BP (!) 155/75   Pulse 74   Temp 98.5 F (36.9 C) (Oral)   Resp 12   Ht 6' (1.829 m)   Wt 63 kg   SpO2 100%   BMI 18.85 kg/m  Physical Exam Vitals and nursing note reviewed.  Constitutional:      General: He is not in acute distress.    Appearance: He is well-developed.     Comments: Coughing occasionally during exam  HENT:     Head: Normocephalic and atraumatic.     Right Ear: External ear normal.     Left Ear: External ear normal.     Nose: Nose normal.     Mouth/Throat:     Mouth: Mucous membranes are dry.     Pharynx: Oropharynx is clear.  Eyes:     Extraocular Movements: Extraocular movements intact.     Conjunctiva/sclera: Conjunctivae normal.     Pupils: Pupils are equal, round, and reactive to light.  Cardiovascular:     Rate and Rhythm: Normal rate and regular rhythm.      Heart sounds: Normal heart sounds.  Pulmonary:     Effort: Pulmonary effort is normal. No respiratory distress.     Breath sounds: Normal breath sounds.  Abdominal:     General: There is no distension.     Palpations: Abdomen is soft. There is no mass.     Tenderness: There is no abdominal tenderness. There is no guarding.  Musculoskeletal:        General: No swelling.     Cervical back: Normal range of motion and neck supple.     Right lower leg: No edema.  Left lower leg: No edema.  Skin:    General: Skin is warm and dry.     Capillary Refill: Capillary refill takes less than 2 seconds.  Neurological:     Mental Status: He is alert.     Comments: MENTAL STATUS: AAOx2 (did not know the year or date which is at baseline per patient's wife) CRANIAL NERVES: II: Pupils equal and reactive 3 mm BL, no RAPD, no VF deficits III, IV, VI: EOM intact, no gaze preference or deviation, no nystagmus. V: normal sensation to light touch in V1, V2, and V3 segments bilaterally VII: no facial weakness or asymmetry, no nasolabial fold flattening VIII: normal hearing to speech and finger friction IX, X: normal palatal elevation, no uvular deviation XI: 5/5 head turn and 5/5 shoulder shrug bilaterally XII: midline tongue protrusion MOTOR: 5/5 strength in R shoulder flexion, elbow flexion and extension, and grip strength. 5/5 strength in L shoulder flexion, elbow flexion and extension, and grip strength.  5/5 strength in R hip and knee flexion, knee extension, ankle plantar and dorsiflexion. 5/5 strength in L hip and knee flexion, knee extension, ankle plantar and dorsiflexion. SENSORY: Normal sensation to light touch in all extremities COORD: Mild right upper extremity dysmetria and difficulty with right lower extremity heel-to-shin.  At baseline per wife.   Psychiatric:        Mood and Affect: Mood normal.        Behavior: Behavior normal.     ED Results / Procedures / Treatments   Labs (all  labs ordered are listed, but only abnormal results are displayed) Labs Reviewed  COMPREHENSIVE METABOLIC PANEL - Abnormal; Notable for the following components:      Result Value   Potassium 3.4 (*)    Glucose, Bld 105 (*)    Calcium 8.7 (*)    Total Protein 6.3 (*)    Total Bilirubin 1.3 (*)    All other components within normal limits  CBC WITH DIFFERENTIAL/PLATELET - Abnormal; Notable for the following components:   Platelets 72 (*)    Monocytes Absolute 2.0 (*)    Abs Immature Granulocytes 0.21 (*)    All other components within normal limits  URINALYSIS, ROUTINE W REFLEX MICROSCOPIC - Abnormal; Notable for the following components:   Color, Urine AMBER (*)    Hgb urine dipstick SMALL (*)    Ketones, ur 5 (*)    RBC / HPF >50 (*)    All other components within normal limits  TROPONIN I (HIGH SENSITIVITY) - Abnormal; Notable for the following components:   Troponin I (High Sensitivity) 20 (*)    All other components within normal limits  TROPONIN I (HIGH SENSITIVITY) - Abnormal; Notable for the following components:   Troponin I (High Sensitivity) 18 (*)    All other components within normal limits  RESP PANEL BY RT-PCR (FLU A&B, COVID) [id number]  URINE CULTURE  CBG MONITORING, ED    EKG EKG Interpretation  Date/Time:  Wednesday January 04 2022 11:41:37 EDT Ventricular Rate:  77 PR Interval:  178 QRS Duration: 98 QT Interval:  423 QTC Calculation: 479 R Axis:   -84 Text Interpretation: Sinus rhythm Left atrial enlargement Abnormal R-wave progression, early transition Inferior infarct, old Consider anterior infarct When compared to 12/08/21 new TWI in V2 Confirmed by Margaretmary Eddy [id number]) on 01/04/2022 11:56:49 AM  Radiology CT Head Wo Contrast  Result Date: 01/04/2022 CLINICAL DATA:  Head trauma, minor (Age >= 65y) EXAM: CT HEAD WITHOUT CONTRAST TECHNIQUE: Contiguous axial  images were obtained from the base of the skull through the vertex without intravenous contrast.  RADIATION DOSE REDUCTION: This exam was performed according to the departmental dose-optimization program which includes automated exposure control, adjustment of the mA and/or kV according to patient size and/or use of iterative reconstruction technique. COMPARISON:  CT head 12/11/2021. FINDINGS: Brain: Mixed density subdural hemorrhages bilaterally. The left-sided subdural hemorrhage measures up to approximately 10 mm (previously measuring up to 15 mm when remeasured on the prior). This subdural collection contains hyperdense blood products layering posteriorly, new from the prior. The right subdural hemorrhage measures up to 1.2 cm in thickness (previously approximately 8 mm in thickness when remeasured on the prior. This collection also has hyperdense blood products which are new in comparison to the prior. Trace rightward midline shift is similar. No evidence of acute large vascular territory infarct, mass lesion, or hydrocephalus. Remote lacunar infarct in the right basal ganglia and patchy chronic microvascular ischemic disease. Vascular: No hyperdense vessel identified. Skull: No acute fracture. Sinuses/Orbits: Visualized sinuses are clear. No acute orbital findings. Other: No mastoid effusions. IMPRESSION: Increased size of an 8 mm thick right subdural hematoma and decreased size of a 10 mm thick left mixed density bilateral cerebral convexity subdural hematoma. Both of these hematomas have new hyperdense blood products, compatible with acute/recent hemorrhage into the collections. Recommend short interval follow-up imaging to exclude progression. Trace rightward midline shift is similar. Findings discussed with Dr. Philip Aspen at 1:54 PM via telephone. Electronically Signed   By: Margaretha Sheffield M.D.   On: 01/04/2022 13:57   DG Chest 1 View  Result Date: 01/04/2022 CLINICAL DATA:  Generalized weakness, rule out pneumonia. EXAM: CHEST  1 VIEW COMPARISON:  Chest radiograph 11/07/2021 FINDINGS: Median  sternotomy wires and mediastinal surgical clips are again seen. The cardiomediastinal silhouette is stable. There is no focal consolidation or pulmonary edema. There is no pleural effusion or pneumothorax There is no acute osseous abnormality. IMPRESSION: No radiographic evidence of acute cardiopulmonary process. Electronically Signed   By: Valetta Mole M.D.   On: 01/04/2022 12:28    Procedures Procedures   Medications Ordered in ED Medications  amLODipine (NORVASC) tablet 5 mg (5 mg Oral Given 01/04/22 1652)  hydrALAZINE (APRESOLINE) injection 2 mg (has no administration in time range)  0.9 %  sodium chloride infusion ( Intravenous New Bag/Given 01/04/22 1844)  oxyCODONE-acetaminophen (PERCOCET/ROXICET) 5-325 MG per tablet 1 tablet (has no administration in time range)  ezetimibe (ZETIA) tablet 10 mg (has no administration in time range)  memantine (NAMENDA) tablet 10 mg (has no administration in time range)  mirtazapine (REMERON) tablet 15 mg (has no administration in time range)  levothyroxine (SYNTHROID) tablet 112 mcg (has no administration in time range)  pantoprazole (PROTONIX) EC tablet 40 mg (has no administration in time range)  polyethylene glycol powder (GLYCOLAX/MIRALAX) container 17 g (has no administration in time range)  finasteride (PROSCAR) tablet 5 mg (has no administration in time range)  divalproex (DEPAKOTE) DR tablet 250 mg (has no administration in time range)  acetaminophen (TYLENOL) tablet 650 mg (has no administration in time range)    Or  acetaminophen (TYLENOL) 160 MG/5ML solution 650 mg (has no administration in time range)    Or  acetaminophen (TYLENOL) suppository 650 mg (has no administration in time range)  labetalol (NORMODYNE) injection 10 mg (10 mg Intravenous Given 01/04/22 1425)  hydrALAZINE (APRESOLINE) tablet 10 mg (10 mg Oral Given 01/04/22 1516)    ED Course/ Medical Decision Making/ A&P Clinical  Course as of 01/04/22 1915  Wed Jan 04, 2022  1502  Talked to Dr Kathyrn Sheriff from neurosurgery.  States that he does not feel that the subdurals are significantly worse.  Feels that he is not currently a surgical candidate.  Recommends blood pressure goals of less than 160.  Does not feel that interval scans are appropriate and she will come talk to the patient and his wife later tonight. [RP]  1800 Discussed with Dr Roosevelt Locks who has agreed to evaluate the patient for admission.  [RP]    Clinical Course User Index [RP] Fransico Meadow, MD                           Medical Decision Making Amount and/or Complexity of Data Reviewed Labs: ordered. Radiology: ordered.  Risk Prescription drug management. Decision regarding hospitalization.   88 year old male with a history of dementia, ICH, thrombocytopenia, BPH, CAD status post CABG, and hypothyroidism who presents emergency department with generalized weakness.   Initial Ddx & MDM: Spontaneous versus traumatic ICH, metabolic encephalopathy (including medications and infection), dehydration, electrolyte abnormalities With the patient's history of thrombocytopenia and recent bleed with recent fall are concerned about possible brain bleed.  Patient does appear to have dysmetria on the right side but is otherwise alert and oriented at his baseline without focal neurologic findings.  Feel the patient's symptoms could also likely be due to global weakness from an infection or dehydration as well so we will test for these etiologies along with electrolyte abnormalities that could accompany them.  After talking to the patient's wife does not appear that he is on any medications that would cause delirium due to polypharmacy.  Plan:  Labs Urinalysis Chest x-ray COVID and flu EKG CT head  ED Summary:  Patient underwent the above work-up and was found to have worsening bilateral subdural hematomas.  Discussed his case with neurosurgery who felt that since he is not a neurosurgical candidate that he  should have his blood pressure controlled and be admitted to medicine for further management.  Did not recommend additional imaging at this time and does not have any anticoagulation that needs to be reversed.  Patient was given labetalol and hydralazine with improvement of his blood pressure to the goal of less than 762 systolic.  His infectious work-up in the emergency department was negative and then he was admitted to medicine for further management of his hypertension and ambulatory dysfunction since patient is unable to walk at this time due to his weakness.  Consults: -Neurosurgery  Additional history obtained from spouse Records reviewed previous admission documents and Care Everywhere/External Records  The following labs were independently interpreted: Urinalysis did not reveal UTI  I independently visualized the following imaging with scope of interpretation limited to determining acute life threatening conditions related to emergency care: CT head, which revealed bilateral subdural hematomas with acute component  Final Clinical Impression(s) / ED Diagnoses Final diagnoses:  Ambulatory dysfunction  Subdural hematoma [facility])    Rx / DC Orders ED Discharge Orders     None         Fransico Meadow, MD 01/04/22 [phone]

## 2022-01-04 NOTE — Consult Note (Signed)
Chief Complaint   Chief Complaint  Patient presents with   Failure To Thrive    History of Present Illness  Patrick [name] is a 88 y.o. male brought into the emergency department via EMS by his wife after over the last week he has been more unsteady on his feet having fallen a few days ago, lack of appetite, and altered mental status.  Patient has a history of multiple previous falls and has previously been diagnosed with bilateral chronic subdural hematomas.  He actually was discharged from inpatient rehabilitation about 2 weeks ago.  His initial work-up revealed the bilateral subdural hematomas and given his age and lack of significant mass effect no surgical intervention was recommended.  In speaking with his wife today, she reports decreased appetite over the last several days, and the patient has not been eating much.  She also reports a fall about 6 days ago when he was walking without his walker.  She feels that the patient has not been acting like himself.  Past Medical History   Past Medical History:  Diagnosis Date   Bilateral foot-drop 12/17/2019   BPH (benign prostatic hypertrophy)    Diverticulosis    ED (erectile dysfunction)    Food impaction of esophagus w/mild gastritis 06/28/2021   Gait abnormality 12/17/2019   GERD (gastroesophageal reflux disease)    Gout    H/O multiple concussions--as teenager    Hepatitis    hx hepatitis after mono as teenager   History of kidney stones    History of skin cancer    Hyperlipidemia    Hypertension    Hypothyroidism    IHD (ischemic heart disease)    Remote CABG in 1997   Memory disorder 12/17/2019   MI, acute, non ST segment elevation ([place]) 2010   s/p cath with occluded SVG to PD that fills by collaterals and the remainder of his revasculsarization is satisfactory. "the first time they did the stent , the second time they did the graft 8 vessels"   Nocturia    Schatzki's ring--mild    Thrombocytopenia ([place]) 11/19/2018    Tremor, essential 12/17/2019   Urinary frequency     Past Surgical History   Past Surgical History:  Procedure Laterality Date   APPENDECTOMY     BALLOON DILATION N/A 03/25/2019   Procedure: BALLOON DILATION;  Surgeon: Otis Brace, MD;  Location: [facility];  Service: Gastroenterology;  Laterality: N/A;   CARDIAC CATHETERIZATION  09/08/2008   NORMAL. EF 60%; Occluded SVG to PD that fills by collaterals and the remainder of his revascularization is satisfactory. he is managed medically.    CORONARY ARTERY BYPASS GRAFT  1997   CABG x 8   CORONARY STENT INTERVENTION N/A 11/18/2018   Procedure: CORONARY STENT INTERVENTION;  Surgeon: Sherren Mocha, MD;  Location: [facility];  Service: Cardiovascular;  Laterality: N/A;   CYSTOSCOPY WITH INSERTION OF UROLIFT N/A 03/19/2015   Procedure: CYSTOSCOPY WITH INSERTION OF FOUR UROLIFTS;  Surgeon: Carolan Clines, MD;  Location: [facility];  Service: Urology;  Laterality: N/A;   ESOPHAGOGASTRODUODENOSCOPY (EGD) WITH PROPOFOL N/A 02/12/2017   Procedure: ESOPHAGOGASTRODUODENOSCOPY (EGD) WITH PROPOFOL;  Surgeon: Otis Brace, MD;  [facility]: [facility];  Service: Gastroenterology;  Laterality: N/A;   ESOPHAGOGASTRODUODENOSCOPY (EGD) WITH PROPOFOL N/A 07/13/2018   Procedure: ESOPHAGOGASTRODUODENOSCOPY (EGD) WITH PROPOFOL;  Surgeon: Clarene Essex, MD;  Location: [facility];  Service: Endoscopy;  Laterality: N/A;   ESOPHAGOGASTRODUODENOSCOPY (EGD) WITH PROPOFOL N/A 03/25/2019   Procedure: ESOPHAGOGASTRODUODENOSCOPY (EGD) WITH PROPOFOL;  Surgeon: Otis Brace, MD;  Location: [facility];  Service: Gastroenterology;  Laterality: N/A;   ESOPHAGOGASTRODUODENOSCOPY (EGD) WITH PROPOFOL N/A 06/28/2021   Procedure: ESOPHAGOGASTRODUODENOSCOPY (EGD) WITH PROPOFOL;  Surgeon: Wilford Corner, MD;  Location: [facility];  Service: Endoscopy;  Laterality: N/A;   FOREIGN BODY REMOVAL N/A 07/13/2018   Procedure: FOREIGN BODY REMOVAL;  Surgeon:  Clarene Essex, MD;  Location: [facility];  Service: Endoscopy;  Laterality: N/A;   IMPACTION REMOVAL  06/28/2021   Procedure: IMPACTION REMOVAL;  Surgeon: Wilford Corner, MD;  Location: [facility];  Service: Endoscopy;;   LEFT HEART CATH AND CORS/GRAFTS ANGIOGRAPHY N/A 11/18/2018   Procedure: LEFT HEART CATH AND CORS/GRAFTS ANGIOGRAPHY;  Surgeon: Sherren Mocha, MD;  Location: [facility];  Service: Cardiovascular;  Laterality: N/A;    Social History   Social History   Tobacco Use   Smoking status: Former    Packs/day: 1.00    Years: 15.00    Total pack years: 15.00    Types: Cigarettes    Quit date: 05/15/1958    Years since quitting: 63.6   Smokeless tobacco: Never  Vaping Use   Vaping Use: Never used  Substance Use Topics   Alcohol use: Yes    Comment: occasional beer   Drug use: No    Medications   Prior to Admission medications   Medication Sig Start Date End Date Taking? Authorizing Provider  diphenhydramine-acetaminophen (TYLENOL PM) 25-500 MG TABS tablet Take 1 tablet by mouth at bedtime as needed (Sleep).   Yes [provider]  divalproex (DEPAKOTE) 250 MG DR tablet Take 1 tablet (250 mg total) by mouth 2 (two) times daily. 12/21/21  Yes Love, Ivan Anchors, PA-C  ezetimibe (ZETIA) 10 MG tablet Take 1 tablet (10 mg total) by mouth daily. 12/21/21  Yes Love, Ivan Anchors, PA-C  finasteride (PROSCAR) 5 MG tablet Take 1 tablet (5 mg total) by mouth daily. 12/21/21  Yes Love, Ivan Anchors, PA-C  fludrocortisone (FLORINEF) 0.1 MG tablet Take 0.5 tablets (0.05 mg total) by mouth daily. 12/21/21  Yes Love, Ivan Anchors, PA-C  levothyroxine (SYNTHROID) 112 MCG tablet Take 1 tablet (112 mcg total) by mouth daily. 12/21/21  Yes Love, Ivan Anchors, PA-C  memantine (NAMENDA) 10 MG tablet Take 1 tablet (10 mg total) by mouth at bedtime. Patient taking differently: Take 10 mg by mouth 2 (two) times daily. 12/21/21 12/16/22 Yes Love, Ivan Anchors, PA-C  mirtazapine (REMERON) 15 MG tablet Take 1 tablet  (15 mg total) by mouth at bedtime. 12/21/21  Yes Love, Ivan Anchors, PA-C  Multiple Vitamin (MULTIVITAMIN WITH MINERALS) TABS tablet Take 1 tablet by mouth daily. 12/21/21  Yes Love, Ivan Anchors, PA-C  nitroGLYCERIN (NITROSTAT) 0.4 MG SL tablet Place 1 tablet (0.4 mg total) under the tongue every 5 (five) minutes as needed. Patient taking differently: Place 0.4 mg under the tongue every 5 (five) minutes as needed for chest pain. 07/03/19  Yes Burtis Junes, NP  oxyCODONE-acetaminophen (PERCOCET/ROXICET) 5-325 MG tablet Take 1 tablet by mouth every 6 (six) hours as needed for severe pain. 12/09/21  Yes Dahal, Marlowe Aschoff, MD  pantoprazole (PROTONIX) 40 MG tablet Take 1 tablet (40 mg total) by mouth daily before breakfast. 12/21/21 12/21/22 Yes Love, Ivan Anchors, PA-C  polyethylene glycol powder (GLYCOLAX/MIRALAX) 17 GM/SCOOP powder Take 17 g by mouth 2 (two) times daily. Patient taking differently: Take 17 g by mouth daily as needed for mild constipation or moderate constipation. 12/22/21  Yes Love, Ivan Anchors, PA-C  vitamin D3 (CHOLECALCIFEROL)  25 MCG tablet Take 1 tablet (1,000 Units total) by mouth daily. 12/21/21  Yes Love, Ivan Anchors, PA-C  busPIRone (BUSPAR) 5 MG tablet Take 1 tablet (5 mg total) by mouth 2 (two) times daily. Patient not taking: Reported on 01/04/2022 12/21/21   Love, Ivan Anchors, PA-C  nystatin (MYCOSTATIN) 100000 UNIT/ML suspension Take 5 mLs (500,000 Units total) by mouth 4 (four) times daily. Patient not taking: Reported on 01/04/2022 12/21/21   Love, Ivan Anchors, PA-C  valACYclovir (VALTREX) 1000 MG tablet Take by mouth. Patient not taking: Reported on 01/04/2022 12/27/21   [provider]    Allergies   Allergies  Allergen Reactions   Flomax [Tamsulosin] Other (See Comments)    Hypotension, dizziness   Pravachol Other (See Comments)    Unknown reaction    Triazolam Other (See Comments)    "Makes me crazy"    Review of Systems  ROS  Neurologic Exam  Awake, alert, oriented Memory and  concentration grossly intact Speech fluent, appropriate CN grossly intact Motor exam: Upper Extremities Deltoid Bicep Tricep Grip  Right 5/5 5/5 5/5 5/5  Left 5/5 5/5 5/5 5/5   Lower Extremities IP Quad PF DF EHL  Right 5/5 5/5 5/5 5/5 5/5  Left 5/5 5/5 5/5 5/5 5/5   Sensation grossly intact to LT  Imaging  CT scan of the head was personally reviewed and demonstrates acute on subacute to chronic bilateral frontoparietal convexity subdural hematomas.  There is significant brain atrophy and therefore minimal local mass effect.  No midline shift.  No hydrocephalus.  Impression  - 88 y.o. male with relatively vague more global symptoms consistent with failure to thrive which I am not entirely convinced are related directly to his subdural hematomas.  Moreover, given his age and chronic thrombocytopenia, I do not feel that he would be a candidate for operative evacuation, and that such an operation is unlikely to improve his overall condition.  Plan  -Would not recommend any operative neurosurgical intervention -Patient will likely be admitted for further work-up, and may need placement after his acute care.  I did review the situation with the patient and his wife at bedside.  I told him that I do not feel the patient would be a good candidate for operative evacuation as I am not convinced a surgery would improve his current symptoms.  Moreover, there is certainly risk that operative evacuation could lead to acute reaccumulation or seizures which would almost certainly cause his demise.  The patient and his wife agree that we should not consider any type of operative intervention.  All their questions today were answered.  Consuella Lose, MD [facility]

## 2022-01-04 NOTE — ED Triage Notes (Addendum)
Pt BIB EMS from home for increased weakness and decreased appetite. Pt is normally ambulatory with a walker however now cannot stand up.  Wife reports dark urine  88HR  98% RA  158/80 Cbg 110

## 2022-01-05 ENCOUNTER — Encounter (HOSPITAL_COMMUNITY): Payer: Self-pay | Admitting: Internal Medicine

## 2022-01-05 ENCOUNTER — Observation Stay (HOSPITAL_COMMUNITY): Payer: Medicare Other

## 2022-01-05 DIAGNOSIS — Z66 Do not resuscitate: Secondary | ICD-10-CM | POA: Diagnosis not present

## 2022-01-05 DIAGNOSIS — Z7189 Other specified counseling: Secondary | ICD-10-CM | POA: Diagnosis not present

## 2022-01-05 DIAGNOSIS — I10 Essential (primary) hypertension: Secondary | ICD-10-CM | POA: Diagnosis not present

## 2022-01-05 DIAGNOSIS — F0781 Postconcussional syndrome: Secondary | ICD-10-CM | POA: Diagnosis present

## 2022-01-05 DIAGNOSIS — R262 Difficulty in walking, not elsewhere classified: Secondary | ICD-10-CM | POA: Diagnosis not present

## 2022-01-05 DIAGNOSIS — R4182 Altered mental status, unspecified: Secondary | ICD-10-CM | POA: Diagnosis not present

## 2022-01-05 DIAGNOSIS — I129 Hypertensive chronic kidney disease with stage 1 through stage 4 chronic kidney disease, or unspecified chronic kidney disease: Secondary | ICD-10-CM | POA: Diagnosis present

## 2022-01-05 DIAGNOSIS — R296 Repeated falls: Secondary | ICD-10-CM | POA: Diagnosis present

## 2022-01-05 DIAGNOSIS — E876 Hypokalemia: Secondary | ICD-10-CM | POA: Diagnosis present

## 2022-01-05 DIAGNOSIS — Z7401 Bed confinement status: Secondary | ICD-10-CM | POA: Diagnosis not present

## 2022-01-05 DIAGNOSIS — E440 Moderate protein-calorie malnutrition: Secondary | ICD-10-CM | POA: Diagnosis present

## 2022-01-05 DIAGNOSIS — E785 Hyperlipidemia, unspecified: Secondary | ICD-10-CM | POA: Diagnosis present

## 2022-01-05 DIAGNOSIS — R627 Adult failure to thrive: Secondary | ICD-10-CM | POA: Diagnosis present

## 2022-01-05 DIAGNOSIS — S065XAD Traumatic subdural hemorrhage with loss of consciousness status unknown, subsequent encounter: Secondary | ICD-10-CM | POA: Diagnosis not present

## 2022-01-05 DIAGNOSIS — R220 Localized swelling, mass and lump, head: Secondary | ICD-10-CM | POA: Diagnosis not present

## 2022-01-05 DIAGNOSIS — I6200 Nontraumatic subdural hemorrhage, unspecified: Secondary | ICD-10-CM | POA: Diagnosis not present

## 2022-01-05 DIAGNOSIS — R338 Other retention of urine: Secondary | ICD-10-CM | POA: Diagnosis present

## 2022-01-05 DIAGNOSIS — W1830XA Fall on same level, unspecified, initial encounter: Secondary | ICD-10-CM | POA: Diagnosis present

## 2022-01-05 DIAGNOSIS — S065XAA Traumatic subdural hemorrhage with loss of consciousness status unknown, initial encounter: Secondary | ICD-10-CM | POA: Diagnosis present

## 2022-01-05 DIAGNOSIS — F0390 Unspecified dementia without behavioral disturbance: Secondary | ICD-10-CM | POA: Diagnosis present

## 2022-01-05 DIAGNOSIS — I951 Orthostatic hypotension: Secondary | ICD-10-CM | POA: Diagnosis not present

## 2022-01-05 DIAGNOSIS — I2510 Atherosclerotic heart disease of native coronary artery without angina pectoris: Secondary | ICD-10-CM

## 2022-01-05 DIAGNOSIS — R339 Retention of urine, unspecified: Secondary | ICD-10-CM | POA: Diagnosis not present

## 2022-01-05 DIAGNOSIS — I739 Peripheral vascular disease, unspecified: Secondary | ICD-10-CM | POA: Diagnosis not present

## 2022-01-05 DIAGNOSIS — Z9181 History of falling: Secondary | ICD-10-CM | POA: Diagnosis not present

## 2022-01-05 DIAGNOSIS — N401 Enlarged prostate with lower urinary tract symptoms: Secondary | ICD-10-CM | POA: Diagnosis present

## 2022-01-05 DIAGNOSIS — G629 Polyneuropathy, unspecified: Secondary | ICD-10-CM | POA: Diagnosis not present

## 2022-01-05 DIAGNOSIS — Z515 Encounter for palliative care: Secondary | ICD-10-CM | POA: Diagnosis not present

## 2022-01-05 DIAGNOSIS — E039 Hypothyroidism, unspecified: Secondary | ICD-10-CM | POA: Diagnosis present

## 2022-01-05 DIAGNOSIS — N1831 Chronic kidney disease, stage 3a: Secondary | ICD-10-CM

## 2022-01-05 DIAGNOSIS — S065X0A Traumatic subdural hemorrhage without loss of consciousness, initial encounter: Secondary | ICD-10-CM | POA: Diagnosis present

## 2022-01-05 DIAGNOSIS — F05 Delirium due to known physiological condition: Secondary | ICD-10-CM | POA: Diagnosis present

## 2022-01-05 DIAGNOSIS — I161 Hypertensive emergency: Secondary | ICD-10-CM | POA: Diagnosis present

## 2022-01-05 DIAGNOSIS — F03911 Unspecified dementia, unspecified severity, with agitation: Secondary | ICD-10-CM | POA: Diagnosis not present

## 2022-01-05 DIAGNOSIS — Z681 Body mass index (BMI) 19 or less, adult: Secondary | ICD-10-CM | POA: Diagnosis not present

## 2022-01-05 DIAGNOSIS — R0602 Shortness of breath: Secondary | ICD-10-CM | POA: Diagnosis not present

## 2022-01-05 DIAGNOSIS — B370 Candidal stomatitis: Secondary | ICD-10-CM | POA: Diagnosis present

## 2022-01-05 DIAGNOSIS — D696 Thrombocytopenia, unspecified: Secondary | ICD-10-CM | POA: Diagnosis present

## 2022-01-05 DIAGNOSIS — Z20822 Contact with and (suspected) exposure to covid-19: Secondary | ICD-10-CM | POA: Diagnosis present

## 2022-01-05 DIAGNOSIS — I25810 Atherosclerosis of coronary artery bypass graft(s) without angina pectoris: Secondary | ICD-10-CM | POA: Diagnosis present

## 2022-01-05 DIAGNOSIS — I6381 Other cerebral infarction due to occlusion or stenosis of small artery: Secondary | ICD-10-CM | POA: Diagnosis not present

## 2022-01-05 LAB — URINE CULTURE: Culture: <10000 — AB

## 2022-01-05 MED ORDER — ORAL CARE MOUTH RINSE: 15.0000 mL | OROMUCOSAL | Status: DC

## 2022-01-05 MED ORDER — ORAL CARE MOUTH RINSE: 15.0000 mL | OROMUCOSAL | Status: DC | PRN

## 2022-01-05 MED ORDER — AMLODIPINE BESYLATE 2.5 MG PO TABS
2.5000 mg | ORAL_TABLET | Freq: Every day | ORAL | Status: DC
  Administered 2022-01-05 – 2022-01-07 (×3): 2.5 mg via ORAL
  Filled 2022-01-05 (×4): qty 1

## 2022-01-05 MED ORDER — HYDRALAZINE HCL 20 MG/ML IJ SOLN
5.0000 mg | Freq: Four times a day (QID) | INTRAMUSCULAR | Status: DC | PRN
Start: 2022-01-05 — End: 2022-01-09
  Administered 2022-01-06 – 2022-01-08 (×4): 5 mg via INTRAVENOUS
  Filled 2022-01-05 (×4): qty 1

## 2022-01-05 MED ORDER — LABETALOL HCL 5 MG/ML IV SOLN
10.0000 mg | INTRAVENOUS | Status: DC | PRN
  Administered 2022-01-05 – 2022-01-09 (×4): 10 mg via INTRAVENOUS
  Filled 2022-01-05 (×4): qty 4

## 2022-01-05 MED ORDER — ORAL CARE MOUTH RINSE
15.0000 mL | OROMUCOSAL | Status: DC
  Administered 2022-01-06 – 2022-01-09 (×13): 15 mL via OROMUCOSAL

## 2022-01-05 NOTE — Progress Notes (Signed)
  Transition of Care [facility]) Screening Note   Patient Details  Name: Patrick [name] Date of Birth: 05/28/1926   Transition of Care [facility]) CM/SW Contact:    Pollie Friar, RN Phone Number: 01/05/2022, 2:00 PM   Pt is from home with spouse. Recommendations are for CIR. Transition of Care Department [facility]) has reviewed patient. We will continue to monitor patient advancement through interdisciplinary progression rounds. If new patient transition needs arise, please place a TOC consult.

## 2022-01-05 NOTE — Evaluation (Signed)
Occupational Therapy Evaluation Patient Details Name: Patrick [name] MRN: [id number] DOB: [birth year] Today's Date: 01/05/2022   History of Present Illness 88 y.o. M admitted on 01/04/22 due to fall 6 days ago, presenting now with increased weakness, cognitive concerns, and difficulty with mobility. Recently admitted on 12/06/21 due to a fall with subdural hematoma's. CT on 8/23 shows 2 subdural hematoma's with increased size compatible with acute recent collection.  PMH significant for CAD, CKD stage IIIa, chronic thrombocytopenia, hypertension, hyperlipidemia, hypothyroidism, BPH, GERD, gout, chronic pain, dementia, depression.   Clinical Impression   Pt admitted for concerns listed above. PTA pt's wife reported that he was been home from [facility] for [facility] weeks and was doing really well with his RW, until he fell 6 days ago. Since then pt has slowly been declining with eating, functional mobility, and cognition. At this time, he presents with ataxic movements when attempting to sit EOB and stand, as  well as increased weakness, balance deficits, and cognitive deficits. He is requiring up to max A +2 for sit<>stands, bed mobility, and most ADL's. Recommending AIR to maximize independence and safety. OT will follow acutely.       Recommendations for follow up therapy are one component of a multi-disciplinary discharge planning process, led by the attending physician.  Recommendations may be updated based on patient status, additional functional criteria and insurance authorization.   Follow Up Recommendations  Acute inpatient rehab (3hours/day)    Assistance Recommended at Discharge Frequent or constant Supervision/Assistance  Patient can return home with the following A lot of help with bathing/dressing/bathroom;Assistance with cooking/housework;Assist for transportation;Direct supervision/assist for financial management;Direct supervision/assist for medications management;Help with stairs or ramp for  entrance;Two people to help with walking and/or transfers    Functional Status Assessment  Patient has had a recent decline in their functional status and demonstrates the ability to make significant improvements in function in a reasonable and predictable amount of time.  Equipment Recommendations  None recommended by OT    Recommendations for Other Services Rehab consult     Precautions / Restrictions Precautions Precautions: Fall Precaution Comments: monitor BP - was orthostatic last admission Restrictions Weight Bearing Restrictions: No      Mobility Bed Mobility Overal bed mobility: Needs Assistance Bed Mobility: Rolling, Supine to Sit, Sit to Supine Rolling: Mod assist, +2 for physical assistance, +2 for safety/equipment   Supine to sit: Max assist, +2 for physical assistance, +2 for safety/equipment Sit to supine: Mod assist, +2 for physical assistance, +2 for safety/equipment   General bed mobility comments: Pt with difficulty sequencing and initiating roll, then to come to sitting he required Max A +2 to pull to trunk to sitting and bring BLE to EOB, on return to supine, pt has able to shift his trunk towards the bed, however needed assists for controlled decent to supine as well as bringing BLE into bed.    Transfers Overall transfer level: Needs assistance Equipment used: 2 person hand held assist Transfers: Sit to/from Stand Sit to Stand: Max assist, +2 physical assistance, +2 safety/equipment           General transfer comment: Heavy assist to power up to standing, pt remaining in a flexed position at hips and knees.      Balance Overall balance assessment: Needs assistance Sitting-balance support: Bilateral upper extremity supported, Feet supported Sitting balance-Leahy Scale: Poor Sitting balance - Comments: Difficulty keeping feet on the floor Postural control: Posterior lean, Left lateral lean Standing balance support: Bilateral upper extremity  supported Standing balance-Leahy Scale: Zero Standing balance comment: Pt needs +2 assist for safety with standing and transfers.                           ADL either performed or assessed with clinical judgement   ADL Overall ADL's : Needs assistance/impaired Eating/Feeding: Minimal assistance;Bed level   Grooming: Minimal assistance;Bed level   Upper Body Bathing: Minimal assistance;Bed level   Lower Body Bathing: Maximal assistance;+2 for physical assistance;+2 for safety/equipment;Bed level;Sitting/lateral leans;Sit to/from stand   Upper Body Dressing : Minimal assistance;Bed level   Lower Body Dressing: Maximal assistance;+2 for physical assistance;+2 for safety/equipment;Bed level;Sitting/lateral leans;Sit to/from stand   Toilet Transfer: Maximal assistance;+2 for physical assistance;+2 for safety/equipment;Squat-pivot   Toileting- Clothing Manipulation and Hygiene: Maximal assistance;+2 for safety/equipment;+2 for physical assistance;Bed level;Sit to/from stand;Sitting/lateral lean       Functional mobility during ADLs: Maximal assistance;+2 for physical assistance;+2 for safety/equipment General ADL Comments: Pt limited with all tasks due to poor balance in sitting and difficutly with standing, additionally needing assist/verbal cuing for sequencing     Vision Baseline Vision/History: 1 Wears glasses Ability to See in Adequate Light: 1 Impaired Patient Visual Report: No change from baseline Vision Assessment?: No apparent visual deficits     Perception     Praxis      Pertinent Vitals/Pain Pain Assessment Pain Assessment: No/denies pain     Hand Dominance Right   Extremity/Trunk Assessment Upper Extremity Assessment Upper Extremity Assessment: Generalized weakness   Lower Extremity Assessment Lower Extremity Assessment: Defer to PT evaluation   Cervical / Trunk Assessment Cervical / Trunk Assessment: Kyphotic   Communication  Communication Communication: HOH   Cognition Arousal/Alertness: Lethargic Behavior During Therapy: Flat affect Overall Cognitive Status: History of cognitive impairments - at baseline                                 General Comments: has history of dementia, however per wife he is slower with responses and having increased difficulty with sequencing at this time     General Comments  VSS on RA, Wife in room, very supportive    Exercises     Shoulder Instructions      Home Living Family/patient expects to be discharged to:: Private residence Living Arrangements: Spouse/significant other Available Help at Discharge: Family;Available 24 hours/day Type of Home: House Home Access: Stairs to enter [employer] of Steps: 2 Entrance Stairs-Rails: Right Home Layout: Two level Alternate Level Stairs-Number of Steps: Able to stay on main level Alternate Level Stairs-Rails: Right Bathroom Shower/Tub: Walk-in shower;Door   [employer] Toilet: Handicapped height Bathroom Accessibility: Yes How Accessible: Accessible via walker Home Equipment: Rolling Walker (2 wheels);BSC/3in1;Shower seat;Grab bars - tub/shower;Wheelchair - manual;Hospital bed          Prior Functioning/Environment Prior Level of Function : Needs assist             Mobility Comments: Uses RW mostly, however does forget intermittently ADLs Comments: Wife assists as needed        OT Problem List: Decreased strength;Decreased activity tolerance;Impaired balance (sitting and/or standing);Impaired vision/perception;Decreased coordination;Decreased cognition;Decreased safety awareness;Decreased knowledge of use of DME or AE;Impaired UE functional use      OT Treatment/Interventions: Self-care/ADL training;Therapeutic exercise;Energy conservation;DME and/or AE instruction    OT Goals(Current goals can be found in the care plan section) Acute Rehab OT Goals Patient Stated Goal: To get  back to  his prior level of functioning OT Goal Formulation: With patient/family Time For Goal Achievement: 01/19/22 Potential to Achieve Goals: Good ADL Goals Pt Will Perform Grooming: with set-up;sitting Pt Will Perform Lower Body Bathing: with min assist;sitting/lateral leans Pt Will Perform Lower Body Dressing: with mod assist;sitting/lateral leans;sit to/from stand Pt Will Transfer to Toilet: with min assist;ambulating Pt Will Perform Toileting - Clothing Manipulation and hygiene: with min assist;sitting/lateral leans;sit to/from stand  OT Frequency: Min 2X/week    Co-evaluation PT/OT/SLP Co-Evaluation/Treatment: Yes Reason for Co-Treatment: For patient/therapist safety;To address functional/ADL transfers   OT goals addressed during session: ADL's and self-care      AM-PAC OT "6 Clicks" Daily Activity     Outcome Measure Help from another person eating meals?: A Little Help from another person taking care of personal grooming?: A Little Help from another person toileting, which includes using toliet, bedpan, or urinal?: Total Help from another person bathing (including washing, rinsing, drying)?: A Lot Help from another person to put on and taking off regular upper body clothing?: A Little Help from another person to put on and taking off regular lower body clothing?: Total 6 Click Score: 13   End of Session Equipment Utilized During Treatment: Gait belt Nurse Communication: Mobility status  Activity Tolerance: Patient tolerated treatment well Patient left: in bed;with call bell/phone within reach;with bed alarm set;with family/visitor present  OT Visit Diagnosis: Unsteadiness on feet (R26.81);Muscle weakness (generalized) (M62.81);History of falling (Z91.81);Dizziness and giddiness (R42);Cognitive communication deficit (R41.841) Symptoms and signs involving cognitive functions: Nontraumatic SAH                Time: 9326-7124 OT Time Calculation (min): 21 min Charges:  OT General  Charges $OT Visit: 1 Visit OT Evaluation $OT Eval Moderate Complexity: 1 Mod  Trinna Kunst, OTR/L  Quynh Basso Elane Yolanda Bonine 01/05/2022, 11:32 AM

## 2022-01-05 NOTE — Progress Notes (Signed)
? ?  Inpatient Rehab Admissions Coordinator : ? ?Per therapy recommendations, patient was screened for CIR candidacy by Katherine Syme RN MSN.  At this time patient appears to be a potential candidate for CIR. I will place a rehab consult per protocol for full assessment. Please call me with any questions. ? ?Demia Viera RN MSN ?Admissions Coordinator ?[phone] ?  ?

## 2022-01-05 NOTE — Evaluation (Signed)
Physical Therapy Evaluation Patient Details Name: Patrick [name] Manwarren MRN: [id number] DOB: 25-Apr-1927 Today's Date: 01/05/2022  History of Present Illness  88 y.o. M admitted on 01/04/22 due to fall 6 days ago, presenting now with increased weakness, cognitive concerns, and difficulty with mobility. Recently admitted on 12/06/21 due to a fall with subdural hematoma's. CT on 8/23 shows 2 subdural hematoma's with increased size compatible with acute recent collection.  PMH significant for CAD, CKD stage IIIa, chronic thrombocytopenia, hypertension, hyperlipidemia, hypothyroidism, BPH, GERD, gout, chronic pain, dementia, depression.  Clinical Impression  Pt admitted secondary to problem above with deficits below. Pt requiring max A +2 to perform bed mobility tasks and to stand at EOB. Pt with increased weakness and poor balance noted throughout. Pt's wife reports pt was doing very well prior to fall and was able to ambulate with RW. Recommending AIR level therapies to increase independence and safety. Will continue to follow acutely.      Recommendations for follow up therapy are one component of a multi-disciplinary discharge planning process, led by the attending physician.  Recommendations may be updated based on patient status, additional functional criteria and insurance authorization.  Follow Up Recommendations Acute inpatient rehab (3hours/day)      Assistance Recommended at Discharge Frequent or constant Supervision/Assistance  Patient can return home with the following  Two people to help with walking and/or transfers;Two people to help with bathing/dressing/bathroom;Assistance with cooking/housework;Assist for transportation;Help with stairs or ramp for entrance    Equipment Recommendations Other (comment) (TBD)  Recommendations for Other Services  Rehab consult    Functional Status Assessment Patient has had a recent decline in their functional status and demonstrates the ability to make  significant improvements in function in a reasonable and predictable amount of time.     Precautions / Restrictions Precautions Precautions: Fall Precaution Comments: monitor BP - was orthostatic last admission Restrictions Weight Bearing Restrictions: No      Mobility  Bed Mobility Overal bed mobility: Needs Assistance Bed Mobility: Rolling, Supine to Sit, Sit to Supine Rolling: Mod assist, +2 for physical assistance, +2 for safety/equipment   Supine to sit: Max assist, +2 for physical assistance, +2 for safety/equipment Sit to supine: Mod assist, +2 for physical assistance, +2 for safety/equipment   General bed mobility comments: Pt with difficulty sequencing and initiating roll, then to come to sitting he required Max A +2 to pull to trunk to sitting and bring BLE to EOB, on return to supine, pt has able to shift his trunk towards the bed, however needed assists for controlled decent to supine as well as bringing BLE into bed.    Transfers Overall transfer level: Needs assistance Equipment used: 2 person hand held assist Transfers: Sit to/from Stand Sit to Stand: Max assist, +2 physical assistance, +2 safety/equipment           General transfer comment: Heavy assist to power up to standing, pt remaining in a flexed position at hips and knees.    Ambulation/Gait                  Stairs            Wheelchair Mobility    Modified Rankin (Stroke Patients Only)       Balance Overall balance assessment: Needs assistance Sitting-balance support: Bilateral upper extremity supported, Feet supported Sitting balance-Leahy Scale: Poor Sitting balance - Comments: Difficulty keeping feet on the floor Postural control: Posterior lean, Left lateral lean Standing balance support: Bilateral upper extremity supported  Standing balance-Leahy Scale: Zero Standing balance comment: Pt needs +2 max assist for safety with standing and transfers.                              Pertinent Vitals/Pain Pain Assessment Pain Assessment: No/denies pain    Home Living Family/patient expects to be discharged to:: Private residence Living Arrangements: Spouse/significant other Available Help at Discharge: Family;Available 24 hours/day Type of Home: House Home Access: Stairs to enter Entrance Stairs-Rails: Right Entrance Stairs-Number of Steps: 2 Alternate Level Stairs-Number of Steps: Able to stay on main level Home Layout: Two level Home Equipment: Rolling Walker (2 wheels);BSC/3in1;Shower seat;Grab bars - tub/shower;Wheelchair - manual;Hospital bed      Prior Function Prior Level of Function : Needs assist             Mobility Comments: Uses RW mostly, however does forget intermittently ADLs Comments: Wife assists as needed     Hand Dominance   Dominant Hand: Right    Extremity/Trunk Assessment   Upper Extremity Assessment Upper Extremity Assessment: Defer to OT evaluation    Lower Extremity Assessment Lower Extremity Assessment: Generalized weakness    Cervical / Trunk Assessment Cervical / Trunk Assessment: Kyphotic  Communication   Communication: HOH  Cognition Arousal/Alertness: Lethargic Behavior During Therapy: Flat affect Overall Cognitive Status: History of cognitive impairments - at baseline                                 General Comments: has history of dementia, however per wife he is slower with responses and having increased difficulty with sequencing at this time        General Comments General comments (skin integrity, edema, etc.): VSS on RA, Wife in room, very supportive    Exercises     Assessment/Plan    PT Assessment Patient needs continued PT services  PT Problem List Decreased strength;Decreased activity tolerance;Decreased balance;Decreased mobility;Decreased coordination;Decreased cognition;Decreased knowledge of use of DME;Decreased safety awareness;Decreased knowledge of  precautions       PT Treatment Interventions DME instruction;Gait training;Stair training;Functional mobility training;Therapeutic activities;Therapeutic exercise;Balance training;Neuromuscular re-education;Cognitive remediation;Patient/family education    PT Goals (Current goals can be found in the Care Plan section)  Acute Rehab PT Goals Patient Stated Goal: To regain independence PT Goal Formulation: With patient/family Time For Goal Achievement: 01/19/22 Potential to Achieve Goals: Good    Frequency Min 3X/week     Co-evaluation PT/OT/SLP Co-Evaluation/Treatment: Yes Reason for Co-Treatment: For patient/therapist safety;To address functional/ADL transfers PT goals addressed during session: Balance;Mobility/safety with mobility OT goals addressed during session: ADL's and self-care       AM-PAC PT "6 Clicks" Mobility  Outcome Measure Help needed turning from your back to your side while in a flat bed without using bedrails?: A Lot Help needed moving from lying on your back to sitting on the side of a flat bed without using bedrails?: Total Help needed moving to and from a bed to a chair (including a wheelchair)?: Total Help needed standing up from a chair using your arms (e.g., wheelchair or bedside chair)?: Total Help needed to walk in hospital room?: Total Help needed climbing 3-5 steps with a railing? : Total 6 Click Score: 7    End of Session Equipment Utilized During Treatment: Gait belt Activity Tolerance: Patient limited by fatigue Patient left: in bed;with bed alarm set;with call bell/phone within reach;with family/visitor present  Nurse Communication: Mobility status PT Visit Diagnosis: Unsteadiness on feet (R26.81);Other symptoms and signs involving the nervous system (D55.208)    Time: 0223-3612 PT Time Calculation (min) (ACUTE ONLY): 21 min   Charges:   PT Evaluation $PT Eval Moderate Complexity: 1 Mod          Reuel Derby, PT, DPT  Acute  Rehabilitation Services  Office: [phone]   Rudean Hitt 01/05/2022, 12:45 PM

## 2022-01-05 NOTE — Progress Notes (Signed)
MD on call paged about patient's agitation and combative behavior. He is restless, attempting to climb out of bed, pulling leads off, and cussing with spouse and staff.

## 2022-01-05 NOTE — Progress Notes (Signed)
[facility] PROGRESS NOTE    Progress Note  Patrick [name]  BVQ:[id number] DOB: [birth year] DOA: 01/04/2022 PCP: Ginger Organ., MD     Brief Narrative:   Patrick [name] is an 88 y.o. male past medical history significant for subarachnoid hemorrhage after fall, orthostatic hypotension poorly controlled hypertension chronic urinary retention secondary to BPH dementia, chronic thrombocytopenia is brought in by family for increased weakness and poor appetite.   Assessment/Plan:   Acute on subacute subdural hematoma [facility]): CT of the head on admission showed increase size 8 mm thick subdural hematoma and a decrease in size and 10 mm thick left mixed subdural hematoma compatible with acute recent collection. Secondary to frequent falls, including the most recent was 6 days ago. Per neurosurgery not a candidate for surgical intervention.  Recommended to repeat a CT of the head. Florinef was discontinued. Lower dose of amlodipine, the wife relates he has been staring at the ceiling the whole night. Patient has an extremely poor prognosis, the family met with the patient last time he was in the hospital. He remains a full code.  Consult palliative care discussed with wife.  Acute on chronic urinary retention: Observe in the last admission, the wife relates he has had significant urethral bleeding. For now continue condom cath.  Hypertensive emergency: Florinef was discontinued received IV labetalol and hydralazine in the ED and started on low-dose Norvasc.  Ambulatory dysfunction: Will probably need skilled awaiting physical therapy evaluation.  BPH: Continue Proscar.  Chronic thrombocytopenia: Stable.    DVT prophylaxis: SCD Family Communication:none Status is: Observation The patient remains OBS appropriate and will d/c before 2 midnights.    Code Status:     Code Status Orders  (From admission, onward)           Start     Ordered   01/04/22 1819   Full code  Continuous        01/04/22 1820           Code Status History     Date Active Date Inactive Code Status Order ID Comments User Context   12/09/2021 1601 12/22/2021 1610 Full Code [id number]  Bary Leriche, PA-C Inpatient   12/06/2021 2250 12/09/2021 1540 Full Code [id number]  Shela Leff, MD Inpatient   11/07/2021 2243 11/09/2021 2233 Full Code [id number]  Vianne Bulls, MD ED   06/23/2019 0142 06/24/2019 2243 Full Code [id number]  Etta Quill, DO ED   02/26/2019 2201 02/28/2019 1532 Full Code [id number]  Toy Baker, MD Inpatient   02/20/2019 1128 02/21/2019 1911 Full Code [id number]  Mercy Riding, MD Inpatient   02/19/2019 1938 02/20/2019 1128 DNR [id number]  Lavina Hamman, MD Inpatient   02/19/2019 1621 02/19/2019 1938 Full Code [id number]  Lavina Hamman, MD Inpatient   11/14/2018 2045 11/19/2018 1619 Full Code [id number]  Isaiah Serge, NP Inpatient      Advance Directive Documentation    Flowsheet Row Most Recent Value  Type of Advance Directive Healthcare Power of Attorney  Pre-existing out of facility DNR order (yellow form or pink MOST form) --  "MOST" Form in Place? --         IV Access:   Peripheral IV   Procedures and diagnostic studies:   CT Head Wo Contrast  Result Date: 01/04/2022 CLINICAL DATA:  Head trauma, minor (Age >= 65y) EXAM: CT HEAD WITHOUT CONTRAST TECHNIQUE: Contiguous axial images were obtained from the base of the skull through the vertex without intravenous  contrast. RADIATION DOSE REDUCTION: This exam was performed according to the departmental dose-optimization program which includes automated exposure control, adjustment of the mA and/or kV according to patient size and/or use of iterative reconstruction technique. COMPARISON:  CT head 12/11/2021. FINDINGS: Brain: Mixed density subdural hemorrhages bilaterally. The left-sided subdural hemorrhage measures up to approximately 10 mm (previously measuring up to 15 mm when remeasured  on the prior). This subdural collection contains hyperdense blood products layering posteriorly, new from the prior. The right subdural hemorrhage measures up to 1.2 cm in thickness (previously approximately 8 mm in thickness when remeasured on the prior. This collection also has hyperdense blood products which are new in comparison to the prior. Trace rightward midline shift is similar. No evidence of acute large vascular territory infarct, mass lesion, or hydrocephalus. Remote lacunar infarct in the right basal ganglia and patchy chronic microvascular ischemic disease. Vascular: No hyperdense vessel identified. Skull: No acute fracture. Sinuses/Orbits: Visualized sinuses are clear. No acute orbital findings. Other: No mastoid effusions. IMPRESSION: Increased size of an 8 mm thick right subdural hematoma and decreased size of a 10 mm thick left mixed density bilateral cerebral convexity subdural hematoma. Both of these hematomas have new hyperdense blood products, compatible with acute/recent hemorrhage into the collections. Recommend short interval follow-up imaging to exclude progression. Trace rightward midline shift is similar. Findings discussed with Dr. Philip Aspen at 1:54 PM via telephone. Electronically Signed   By: Margaretha Sheffield M.D.   On: 01/04/2022 13:57   DG Chest 1 View  Result Date: 01/04/2022 CLINICAL DATA:  Generalized weakness, rule out pneumonia. EXAM: CHEST  1 VIEW COMPARISON:  Chest radiograph 11/07/2021 FINDINGS: Median sternotomy wires and mediastinal surgical clips are again seen. The cardiomediastinal silhouette is stable. There is no focal consolidation or pulmonary edema. There is no pleural effusion or pneumothorax There is no acute osseous abnormality. IMPRESSION: No radiographic evidence of acute cardiopulmonary process. Electronically Signed   By: Valetta Mole M.D.   On: 01/04/2022 12:28     Medical Consultants:   None.   Subjective:    Patrick [name] has no  complaints.  Objective:    Vitals:   01/05/22 0430 01/05/22 0445 01/05/22 0530 01/05/22 0630  BP: (!) 139/111 (!) 147/74 (!) 158/70 (!) 152/81  Pulse: 88 85 86 84  Resp: (!) 24 (!) 22 18 (!) 24  Temp:    98.4 F (36.9 C)  TempSrc:      SpO2: 99% 97% 96% 95%  Weight:      Height:       SpO2: 95 %  No intake or output data in the 24 hours ending 01/05/22 0659 Filed Weights   01/04/22 1142  Weight: 63 kg    Exam: General exam: In no acute distress. Respiratory system: Good air movement and clear to auscultation. Cardiovascular system: S1 & S2 heard, RRR. No JVD. Gastrointestinal system: Abdomen is nondistended, soft and nontender.  Central nervous system: Alert and oriented x3, with slurred speech. Extremities: No pedal edema. Skin: No rashes, lesions or ulcers   Data Reviewed:    Labs: Basic Metabolic Panel: Recent Labs  Lab 01/04/22 1225  NA 138  K 3.4*  CL 104  CO2 25  GLUCOSE 105*  BUN 10  CREATININE 1.00  CALCIUM 8.7*   GFR Estimated Creatinine Clearance: 42.1 mL/min (by C-G formula based on SCr of 1 mg/dL). Liver Function Tests: Recent Labs  Lab 01/04/22 1225  AST 17  ALT 15  ALKPHOS 55  BILITOT  1.3*  PROT 6.3*  ALBUMIN 3.7   No results for input(s): "LIPASE", "AMYLASE" in the last 168 hours. No results for input(s): "AMMONIA" in the last 168 hours. Coagulation profile No results for input(s): "INR", "PROTIME" in the last 168 hours. COVID-19 Labs  No results for input(s): "DDIMER", "FERRITIN", "LDH", "CRP" in the last 72 hours.  Lab Results  Component Value Date   SARSCOV2NAA NEGATIVE 01/04/2022   SARSCOV2NAA NEGATIVE 06/28/2021   [place] NEGATIVE 06/23/2019   [place] NEGATIVE 03/25/2019    CBC: Recent Labs  Lab 01/04/22 1225  WBC 7.2  NEUTROABS 3.5  HGB 14.1  HCT 42.4  MCV 97.2  PLT 72*   Cardiac Enzymes: No results for input(s): "CKTOTAL", "CKMB", "CKMBINDEX", "TROPONINI" in the last 168 hours. BNP (last 3  results) No results for input(s): "PROBNP" in the last 8760 hours. CBG: Recent Labs  Lab 01/04/22 1228  GLUCAP 92   D-Dimer: No results for input(s): "DDIMER" in the last 72 hours. Hgb A1c: No results for input(s): "HGBA1C" in the last 72 hours. Lipid Profile: No results for input(s): "CHOL", "HDL", "LDLCALC", "TRIG", "CHOLHDL", "LDLDIRECT" in the last 72 hours. Thyroid function studies: No results for input(s): "TSH", "T4TOTAL", "T3FREE", "THYROIDAB" in the last 72 hours.  Invalid input(s): "FREET3" Anemia work up: No results for input(s): "VITAMINB12", "FOLATE", "FERRITIN", "TIBC", "IRON", "RETICCTPCT" in the last 72 hours. Sepsis Labs: Recent Labs  Lab 01/04/22 1225  WBC 7.2   Microbiology Recent Results (from the past 240 hour(s))  Resp Panel by RT-PCR (Flu A&B, Covid) Anterior Nasal Swab     Status: None   Collection Time: 01/04/22 12:10 PM   Specimen: Anterior Nasal Swab  Result Value Ref Range Status   SARS Coronavirus 2 by RT PCR NEGATIVE NEGATIVE Final    Comment: (NOTE) SARS-CoV-2 target nucleic acids are NOT DETECTED.  The SARS-CoV-2 RNA is generally detectable in upper respiratory specimens during the acute phase of infection. The lowest concentration of SARS-CoV-2 viral copies this assay can detect is 138 copies/mL. A negative result does not preclude SARS-Cov-2 infection and should not be used as the sole basis for treatment or other patient management decisions. A negative result may occur with  improper specimen collection/handling, submission of specimen other than nasopharyngeal swab, presence of viral mutation(s) within the areas targeted by this assay, and inadequate number of viral copies(<138 copies/mL). A negative result must be combined with clinical observations, patient history, and epidemiological information. The expected result is Negative.  Fact Sheet for Patients:  EntrepreneurPulse.com.au  Fact Sheet for Healthcare  Providers:  IncredibleEmployment.be  This test is no t yet approved or cleared by the [place] FDA and  has been authorized for detection and/or diagnosis of SARS-CoV-2 by FDA under an Emergency Use Authorization (EUA). This EUA will remain  in effect (meaning this test can be used) for the duration of the COVID-19 declaration under Section 564(b)(1) of the Act, 21 U.S.C.section 360bbb-3(b)(1), unless the authorization is terminated  or revoked sooner.       Influenza A by PCR NEGATIVE NEGATIVE Final   Influenza B by PCR NEGATIVE NEGATIVE Final    Comment: (NOTE) The Xpert Xpress SARS-CoV-2/FLU/RSV plus assay is intended as an aid in the diagnosis of influenza from Nasopharyngeal swab specimens and should not be used as a sole basis for treatment. Nasal washings and aspirates are unacceptable for Xpert Xpress SARS-CoV-2/FLU/RSV testing.  Fact Sheet for Patients: EntrepreneurPulse.com.au  Fact Sheet for Healthcare Providers: IncredibleEmployment.be  This test is not  yet approved or cleared by the [place] and has been authorized for detection and/or diagnosis of SARS-CoV-2 by FDA under an Emergency Use Authorization (EUA). This EUA will remain in effect (meaning this test can be used) for the duration of the COVID-19 declaration under Section 564(b)(1) of the Act, 21 U.S.C. section 360bbb-3(b)(1), unless the authorization is terminated or revoked.  Performed at [facility], [place] [address] [place], [place] [address]      Medications:    amLODipine  5 mg Oral Daily   divalproex  250 mg Oral BID   ezetimibe  10 mg Oral Daily   finasteride  5 mg Oral Daily   levothyroxine  112 mcg Oral Q0600   memantine  10 mg Oral BID   mirtazapine  15 mg Oral QHS   pantoprazole  40 mg Oral QAC breakfast   potassium chloride  40 mEq Oral Once   Continuous Infusions:  sodium chloride 100 mL/hr at 01/04/22 1844       LOS: 0 days   Charlynne Cousins  [facility]  01/05/2022, 6:59 AM

## 2022-01-05 NOTE — Consult Note (Signed)
Consultation Note Date: 01/05/2022   Patient Name: Patrick [name]  DOB: [birth year]  MRN: [id number]  Age / Sex: 88 y.o., male  PCP: Ginger Organ., MD Referring Physician: Aileen Fass, Tammi Klippel, MD  Reason for Consultation: Establishing goals of care and code status discussion  HPI/Patient Profile: 88 y.o. male  with past medical history of  recent subarachnoid hemorrhage after a fall, orthostatic hypotension, poorly controlled HTN, frequent falls, chronic urinary retention probably secondary to BPH, dementia with behavioral symptoms, chronic thrombocytopenia, chronic peripheral neuropathy, CAD admitted on 01/04/2022 with increased weakness, poor appetite, recurrent falls.    Patient with another fall 6 days ago and admitted with acute on subacute-chronic subdural hematoma. Patient has been admitted 4 times in the past 6 months. PMT has been consulted to assist with goals of care conversation.  Clinical Assessment and Goals of Care:  I have reviewed medical records including EPIC notes, labs and imaging, received report from RN, assessed the patient and then met at the bedside with patient's wife Patrick [name] to discuss diagnosis prognosis, [place], EOL wishes, disposition and options.  I introduced Palliative Medicine as specialized medical care for people living with serious illness. It focuses on providing relief from the symptoms and stress of a serious illness. The goal is to improve quality of life for both the patient and the family.  We discussed a brief life review of the patient and then focused on their current illness.   I attempted to elicit values and goals of care important to the patient.    Medical History Review and Understanding:  We reviewed patient's several chronic comorbidities including chronic SDHs, recurrent falls, CKD, blood pressure problems.   Social History: Patient has been living at  home with his wife as his primary caregiver for the past two weeks after recent CIR stay and hospital admission in July. 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 Fraser Din and Patrick [name] have been married for about 14 years.  Functional and Nutritional State: Patient recently made progress with his ambulation and was able to use a RW however was frequently forgetting to use it. Patrick [name] shares he has not eaten in the past 4 days and she feels this is due to recurrent thrush infection.   Code Status: Reviewed at length and recommended consideration of DNR status, understanding evidenced-based poor outcomes in similar hospitalized patients, as the cause of the arrest is likely associated with chronic/terminal disease rather than a reversible acute cardio-pulmonary event.   Discussion: Initially started the conversation with Patrick [name] outside of the room, reviewing his decline since returning home a couple of weeks ago. She expresses concern that he has had so many complications over the past few months, recognizing that something will need to change in order to keep him safe. I questioned the result of their conversations regarding code status, which was also reviewed during his last hospital stay. She tells me he was quite alarmed with this conversation, wanting to talk to his lawyer and becoming emotional about his mortality despite reassurance. We again reviewed his previous DNR but that he has since changed his mind and is open to any and all interventions to keep him alive or resuscitate him should he die despite our best efforts at treating his illness. We then met with Fraser Din at the bedside, who states "I sure would" want CPR, intubation, and even a feeding tube should he be unable to swallow. It  is unclear how much he truly understands his decision, but likely is responding from an emotional standpoint with some  uneasiness about end-of-life anticipatory care planning. He is impulsive and attempted to get up during our conversation in the hallway with a near fall out of a chair. Patrick [name] is committed to advocating for him and honoring his wishes, choosing to respect his desire for full code unless he states otherwise. She feels outpatient palliative care support for ongoing conversations based on his status would be very helpful, but will continue discussing with Fraser Din before proceeding with referral. We discussed her concern for his history of behaving this way with UTIs (even low counts on urine cultures) as well as for liberalizing his diet and treating thrush to improve his intake. She also wants to be sure that his medical history is only discussed with him and her and NO other family or friends.    The difference between aggressive medical intervention and comfort care was considered in light of the patient's goals of care. Hospice and Palliative Care services outpatient were explained and offered.   Discussed the importance of continued conversation with family and the medical providers regarding overall plan of care and treatment options, ensuring decisions are within the context of the patient's values and GOCs.   Questions and concerns were addressed.  Hard Choices booklet left for review. The family was encouraged to call with questions or concerns.  PMT will continue to support holistically.   SUMMARY OF RECOMMENDATIONS   -Continue full code/full scope treatment -Patient and his wife are hopeful for CIR with ultimate goal of continuing to live at home as long as possible -Discussed wife's request for treatment of bacteruria and suspected thrush with primary attending, as well as her request for regular diet to encourage oral intake -Patient and wife are considering outpatient palliative support at discharge -Psychosocial and emotional support provided -PMT will continue to follow and support as  needed   Prognosis:  Unable to determine  Discharge Planning: To Be Determined      Primary Diagnoses: Present on Admission:  Essential (primary) hypertension  Coronary artery disease involving native coronary artery of native heart without angina pectoris  Stage 3a chronic kidney disease (CKD) ([place])  Malnutrition of moderate degree   Physical Exam Vitals and nursing note reviewed.  Constitutional:      General: He is not in acute distress. Cardiovascular:     Rate and Rhythm: Normal rate.  Pulmonary:     Effort: Pulmonary effort is normal. No respiratory distress.  Skin:    General: Skin is warm and dry.  Neurological:     Mental Status: He is alert. Mental status is at baseline.  Psychiatric:        Mood and Affect: Mood normal.        Cognition and Memory: Cognition is impaired.        Judgment: Judgment is impulsive.     Vital Signs: BP (!) 146/69 (BP Location: Left Arm)   Pulse 81   Temp 99 F (37.2 C) (Oral)   Resp 14   Ht 6' (1.829 m)   Wt 63 kg   SpO2 97%   BMI 18.85 kg/m  Pain Scale: PAINAD   Pain Score: Asleep   SpO2: SpO2: 97 % O2 Device:SpO2: 97 % O2 Flow Rate: .    Palliative Assessment/Data: 50%    MDM: high   Aleeya Veitch Johnnette Litter, PA-C  Palliative Medicine Team Team phone # [phone]  Thank you  for allowing the Palliative Medicine Team to assist in the care of this patient. Please utilize secure chat with additional questions, if there is no response within 30 minutes please call the above phone number.  Palliative Medicine Team providers are available by phone from 7am to 7pm daily and can be reached through the team cell phone.  Should this patient require assistance outside of these hours, please call the patient's attending physician.

## 2022-01-06 DIAGNOSIS — I2510 Atherosclerotic heart disease of native coronary artery without angina pectoris: Secondary | ICD-10-CM | POA: Diagnosis not present

## 2022-01-06 DIAGNOSIS — N1831 Chronic kidney disease, stage 3a: Secondary | ICD-10-CM | POA: Diagnosis not present

## 2022-01-06 DIAGNOSIS — F05 Delirium due to known physiological condition: Secondary | ICD-10-CM

## 2022-01-06 DIAGNOSIS — S065XAA Traumatic subdural hemorrhage with loss of consciousness status unknown, initial encounter: Secondary | ICD-10-CM | POA: Diagnosis not present

## 2022-01-06 DIAGNOSIS — I10 Essential (primary) hypertension: Secondary | ICD-10-CM | POA: Diagnosis not present

## 2022-01-06 MED ORDER — FLUCONAZOLE IN SODIUM CHLORIDE 200-0.9 MG/100ML-% IV SOLN
200.0000 mg | Freq: Once | INTRAVENOUS | Status: AC
  Administered 2022-01-06: 200 mg via INTRAVENOUS
  Filled 2022-01-06: qty 100

## 2022-01-06 MED ORDER — QUETIAPINE FUMARATE 25 MG PO TABS: 25.0000 mg | ORAL_TABLET | Freq: Every evening | ORAL | Status: DC | PRN

## 2022-01-06 MED ORDER — SODIUM CHLORIDE 0.9 % IV BOLUS
1000.0000 mL | Freq: Once | INTRAVENOUS | Status: AC
Start: 2022-01-06 — End: 2022-01-06
  Administered 2022-01-06: 1000 mL via INTRAVENOUS

## 2022-01-06 MED ORDER — MELATONIN 3 MG PO TABS
3.0000 mg | ORAL_TABLET | Freq: Every day | ORAL | Status: DC
Start: 2022-01-06 — End: 2022-01-09
  Administered 2022-01-06 – 2022-01-07 (×2): 3 mg via ORAL
  Filled 2022-01-06 (×2): qty 1

## 2022-01-06 MED ORDER — QUETIAPINE FUMARATE 25 MG PO TABS
25.0000 mg | ORAL_TABLET | Freq: Every evening | ORAL | Status: AC | PRN
  Administered 2022-01-06: 25 mg via ORAL
  Filled 2022-01-06: qty 1

## 2022-01-06 MED ORDER — HALOPERIDOL LACTATE 5 MG/ML IJ SOLN
2.5000 mg | Freq: Four times a day (QID) | INTRAMUSCULAR | Status: DC | PRN
Start: 2022-01-06 — End: 2022-01-06

## 2022-01-06 MED ORDER — HALOPERIDOL LACTATE 5 MG/ML IJ SOLN
5.0000 mg | Freq: Four times a day (QID) | INTRAMUSCULAR | Status: DC | PRN
  Administered 2022-01-06 (×3): 5 mg via INTRAVENOUS
  Filled 2022-01-06 (×3): qty 1

## 2022-01-06 MED ORDER — SODIUM CHLORIDE 0.9 % IV SOLN: INTRAVENOUS | Status: DC

## 2022-01-06 MED ORDER — FLUCONAZOLE 100MG IVPB
100.0000 mg | INTRAVENOUS | Status: DC
  Administered 2022-01-07 – 2022-01-09 (×3): 100 mg via INTRAVENOUS
  Filled 2022-01-06 (×3): qty 50

## 2022-01-06 MED ORDER — HALOPERIDOL LACTATE 5 MG/ML IJ SOLN
2.5000 mg | Freq: Once | INTRAMUSCULAR | Status: AC
  Administered 2022-01-06: 2.5 mg via INTRAVENOUS
  Filled 2022-01-06: qty 1

## 2022-01-06 NOTE — Progress Notes (Signed)
Patient has been agitated and confused throughout the shift. Patient has jerking of hands and arms sometimes stating he is falling. Patient's blood pressure reads high, but he is very difficult to keep still and obtain an accurate blood pressure. Will continue to monitor. Movements of patient's arms, legs, hands, etc are spastic at times but he is able to control them when more alert and oriented.

## 2022-01-06 NOTE — Progress Notes (Addendum)
Inpatient Rehab Admissions Coordinator:   Patient is not able to participate with therapies today, due to medical status. Will continue to watch/follow for appropriateness for potential CIR admission.   Rehab Admissons Coordinator [place], [place], [place] [phone]

## 2022-01-06 NOTE — Progress Notes (Signed)
PT Cancellation Note  Patient Details Name: Patrick [name] MRN: [id number] DOB: 09-28-1926   Cancelled Treatment:    Reason Eval/Treat Not Completed: Fatigue/lethargy limiting ability to participate. Per wife, pt has not slept well in days and is finally sleeping after receiving Haldol. PT opted not to wake pt and discussed PT's plan to return Monday to continue with POC.    Thelma Comp 01/06/2022, 2:40 PM  Rolinda Roan, PT, DPT Acute Rehabilitation Services Secure Chat Preferred Office: [phone]

## 2022-01-06 NOTE — Consult Note (Signed)
   [facility] Inpatient Consult   01/06/2022  [place] [birth year] [id number]    [place] Organization [ACO] Patient: Medicare ACO REACH   Primary Care Provider: Ginger Organ., MD, [facility], is an [place] provider with a Care Management team and program and is listed for the [facility] follow up needs     Patient screened for high risk score for unplanned readmission risk with less than 30 days readmission recently from a CIR stay.  Reviewed to assess for potential [place] Management service needs for post hospital transition for readmission prevention needs.  Review of patient's medical record reveals patient is being recommended for a CIR level of care.     Plan:  Continue to follow progress and disposition to assess for post hospital care management needs.  For questions contact:    [name], RN BSN [facility] Liaison  [phone] business mobile phone Toll free office [phone]  Fax number: [phone] [email or web address]   .

## 2022-01-06 NOTE — Progress Notes (Signed)
[facility] PROGRESS NOTE    Progress Note  Patrick [name]  JKK:[id number] DOB: 1927-04-15 DOA: 01/04/2022 PCP: Ginger Organ., MD     Brief Narrative:   Patrick [name] is an 88 y.o. male past medical history significant for subarachnoid hemorrhage after fall, orthostatic hypotension poorly controlled hypertension chronic urinary retention secondary to BPH dementia, chronic thrombocytopenia is brought in by family for increased weakness and poor appetite.   Assessment/Plan:   Acute on subacute subdural hematoma [facility]): CT of the head on admission showed increase size 8 mm thick subdural hematoma and a decrease in size and 10 mm thick left mixed subdural hematoma compatible with acute recent collection. Secondary to frequent falls, including the most recent was 6 days ago. Per neurosurgery not a candidate for surgical intervention.   Repeated CT scan shows stable subdural hematoma, with mild intracranial mass effect no midline shift. Palliative care met with family and patient they want to continue at the measures. I have spoken to them talk with him that he will not survive CPR they seem to understand the risk and benefits. Place him n.p.o. start him on IV fluid hydration.  Acute confusional state: Patient not sleeping and I be coming startled and hallucinating. I have talked to the family that there is a bad prognosis. They relate they want to continue aggressive treatment. At high risk of aspiration.  Acute on chronic urinary retention: Observe in the last admission, the wife relates he has had significant urethral bleeding. For now continue condom cath.  Hypertensive emergency: Florinef was discontinued received IV labetalol and hydralazine in the ED and started on low-dose Norvasc.  Ambulatory dysfunction: Will probably need skilled awaiting physical therapy evaluation.  BPH: Continue Proscar.  Chronic thrombocytopenia: Stable.    DVT prophylaxis:  SCD Family Communication:none Status is: Observation The patient remains OBS appropriate and will d/c before 2 midnights.    Code Status:     Code Status Orders  (From admission, onward)           Start     Ordered   01/04/22 1819  Full code  Continuous        01/04/22 1820           Code Status History     Date Active Date Inactive Code Status Order ID Comments User Context   12/09/2021 1601 12/22/2021 1610 Full Code [id number]  Flora Lipps Inpatient   12/06/2021 2250 12/09/2021 1540 Full Code [id number]  Shela Leff, MD Inpatient   11/07/2021 2243 11/09/2021 2233 Full Code [id number]  Vianne Bulls, MD ED   06/23/2019 0142 06/24/2019 2243 Full Code [id number]  Etta Quill, DO ED   02/26/2019 2201 02/28/2019 1532 Full Code [id number]  Toy Baker, MD Inpatient   02/20/2019 1128 02/21/2019 1911 Full Code [id number]  Mercy Riding, MD Inpatient   02/19/2019 1938 02/20/2019 1128 DNR [id number]  Lavina Hamman, MD Inpatient   02/19/2019 1621 02/19/2019 1938 Full Code [id number]  Lavina Hamman, MD Inpatient   11/14/2018 2045 11/19/2018 1619 Full Code [id number]  Isaiah Serge, NP Inpatient      Advance Directive Documentation    Flowsheet Row Most Recent Value  Type of Advance Directive Healthcare Power of Attorney  Pre-existing out of facility DNR order (yellow form or pink MOST form) --  "MOST" Form in Place? --         IV Access:   Peripheral IV   Procedures and diagnostic studies:  CT HEAD WO CONTRAST (5MM)  Result Date: 01/05/2022 CLINICAL DATA:  88 year old male with bilateral subdural hematoma. Small volume intracranial hemorrhage in June, July. EXAM: CT HEAD WITHOUT CONTRAST TECHNIQUE: Contiguous axial images were obtained from the base of the skull through the vertex without intravenous contrast. RADIATION DOSE REDUCTION: This exam was performed according to the departmental dose-optimization program which includes automated exposure control,  adjustment of the mA and/or kV according to patient size and/or use of iterative reconstruction technique. COMPARISON:  Head CT 01/04/2022.  Brain MRI 10/19/2021. FINDINGS: Brain: Mixed density bilateral subdural hematoma along both superior convexities is 10 mm on the left, stable, and ranges from 9-12 mm on the right, stable. Associated mild intracranial mass effect is stable with no significant midline shift. Stable ventricular system, no ventriculomegaly. Basilar cisterns remain patent. No new intracranial hemorrhage. No cortically based acute infarct identified. Chronic small vessel disease appears stable by CT. Vascular: Calcified atherosclerosis at the skull base. Skull: Stable and intact. Sinuses/Orbits: Visualized paranasal sinuses and mastoids are stable and well aerated. Other: No acute orbit or scalp soft tissue finding. IMPRESSION: 1. Stable bilateral mixed density subdural hematomas since yesterday (10-12 mm). Stable mild intracranial mass effect with no midline shift or loss of basilar cisterns. 2. No new intracranial abnormality. Underlying chronic small vessel disease. Electronically Signed   By: Genevie Ann M.D.   On: 01/05/2022 07:56   CT Head Wo Contrast  Result Date: 01/04/2022 CLINICAL DATA:  Head trauma, minor (Age >= 65y) EXAM: CT HEAD WITHOUT CONTRAST TECHNIQUE: Contiguous axial images were obtained from the base of the skull through the vertex without intravenous contrast. RADIATION DOSE REDUCTION: This exam was performed according to the departmental dose-optimization program which includes automated exposure control, adjustment of the mA and/or kV according to patient size and/or use of iterative reconstruction technique. COMPARISON:  CT head 12/11/2021. FINDINGS: Brain: Mixed density subdural hemorrhages bilaterally. The left-sided subdural hemorrhage measures up to approximately 10 mm (previously measuring up to 15 mm when remeasured on the prior). This subdural collection contains  hyperdense blood products layering posteriorly, new from the prior. The right subdural hemorrhage measures up to 1.2 cm in thickness (previously approximately 8 mm in thickness when remeasured on the prior. This collection also has hyperdense blood products which are new in comparison to the prior. Trace rightward midline shift is similar. No evidence of acute large vascular territory infarct, mass lesion, or hydrocephalus. Remote lacunar infarct in the right basal ganglia and patchy chronic microvascular ischemic disease. Vascular: No hyperdense vessel identified. Skull: No acute fracture. Sinuses/Orbits: Visualized sinuses are clear. No acute orbital findings. Other: No mastoid effusions. IMPRESSION: Increased size of an 8 mm thick right subdural hematoma and decreased size of a 10 mm thick left mixed density bilateral cerebral convexity subdural hematoma. Both of these hematomas have new hyperdense blood products, compatible with acute/recent hemorrhage into the collections. Recommend short interval follow-up imaging to exclude progression. Trace rightward midline shift is similar. Findings discussed with Dr. Philip Aspen at 1:54 PM via telephone. Electronically Signed   By: Margaretha Sheffield M.D.   On: 01/04/2022 13:57   DG Chest 1 View  Result Date: 01/04/2022 CLINICAL DATA:  Generalized weakness, rule out pneumonia. EXAM: CHEST  1 VIEW COMPARISON:  Chest radiograph 11/07/2021 FINDINGS: Median sternotomy wires and mediastinal surgical clips are again seen. The cardiomediastinal silhouette is stable. There is no focal consolidation or pulmonary edema. There is no pleural effusion or pneumothorax There is no acute osseous abnormality. IMPRESSION:  No radiographic evidence of acute cardiopulmonary process. Electronically Signed   By: Valetta Mole M.D.   On: 01/04/2022 12:28     Medical Consultants:   None.   Subjective:    Patrick [name] confused  Objective:    Vitals:   01/06/22 0031 01/06/22  0440 01/06/22 0638 01/06/22 0833  BP:  (!) 182/92 (!) 172/67 (!) 127/103  Pulse:  97 85 88  Resp:  _0 Temp:  98.9 F (37.2 C)  99.1 F (37.3 C)  TempSrc:  Axillary  Oral  SpO2:  97%  96%  Weight: 62.4 kg     Height:       SpO2: 96 %   Intake/Output Summary (Last 24 hours) at 01/06/2022 0842 Last data filed at 01/06/2022 0454 Gross per 24 hour  Intake 2443.29 ml  Output 1075 ml  Net 1368.29 ml   Filed Weights   01/04/22 1142 01/05/22 2006 01/06/22 0031  Weight: 63 kg 63.2 kg 62.4 kg    Exam: General exam: In no acute distress. Respiratory system: Good air movement and clear to auscultation. Cardiovascular system: S1 & S2 heard, RRR. No JVD. Gastrointestinal system: Abdomen is nondistended, soft and nontender.  Extremities: No pedal edema. Skin: No rashes, lesions or ulcers Psychiatry: Judgement and insight appear normal. Mood & affect appropriate.   Data Reviewed:    Labs: Basic Metabolic Panel: Recent Labs  Lab 01/04/22 1225  NA 138  K 3.4*  CL 104  CO2 25  GLUCOSE 105*  BUN 10  CREATININE 1.00  CALCIUM 8.7*    GFR Estimated Creatinine Clearance: 41.6 mL/min (by C-G formula based on SCr of 1 mg/dL). Liver Function Tests: Recent Labs  Lab 01/04/22 1225  AST 17  ALT 15  ALKPHOS 55  BILITOT 1.3*  PROT 6.3*  ALBUMIN 3.7    No results for input(s): "LIPASE", "AMYLASE" in the last 168 hours. No results for input(s): "AMMONIA" in the last 168 hours. Coagulation profile No results for input(s): "INR", "PROTIME" in the last 168 hours. COVID-19 Labs  No results for input(s): "DDIMER", "FERRITIN", "LDH", "CRP" in the last 72 hours.  Lab Results  Component Value Date   SARSCOV2NAA NEGATIVE 01/04/2022   SARSCOV2NAA NEGATIVE 06/28/2021   [place] NEGATIVE 06/23/2019   [place] NEGATIVE 03/25/2019    CBC: Recent Labs  Lab 01/04/22 1225  WBC 7.2  NEUTROABS 3.5  HGB 14.1  HCT 42.4  MCV 97.2  PLT 72*    Cardiac Enzymes: No  results for input(s): "CKTOTAL", "CKMB", "CKMBINDEX", "TROPONINI" in the last 168 hours. BNP (last 3 results) No results for input(s): "PROBNP" in the last 8760 hours. CBG: Recent Labs  Lab 01/04/22 1228  GLUCAP 92    D-Dimer: No results for input(s): "DDIMER" in the last 72 hours. Hgb A1c: No results for input(s): "HGBA1C" in the last 72 hours. Lipid Profile: No results for input(s): "CHOL", "HDL", "LDLCALC", "TRIG", "CHOLHDL", "LDLDIRECT" in the last 72 hours. Thyroid function studies: No results for input(s): "TSH", "T4TOTAL", "T3FREE", "THYROIDAB" in the last 72 hours.  Invalid input(s): "FREET3" Anemia work up: No results for input(s): "VITAMINB12", "FOLATE", "FERRITIN", "TIBC", "IRON", "RETICCTPCT" in the last 72 hours. Sepsis Labs: Recent Labs  Lab 01/04/22 1225  WBC 7.2    Microbiology Recent Results (from the past 240 hour(s))  Resp Panel by RT-PCR (Flu A&B, Covid) Anterior Nasal Swab     Status: None   Collection Time: 01/04/22 12:10 PM   Specimen: Anterior Nasal Swab  Result Value Ref Range Status   SARS Coronavirus 2 by RT PCR NEGATIVE NEGATIVE Final    Comment: (NOTE) SARS-CoV-2 target nucleic acids are NOT DETECTED.  The SARS-CoV-2 RNA is generally detectable in upper respiratory specimens during the acute phase of infection. The lowest concentration of SARS-CoV-2 viral copies this assay can detect is 138 copies/mL. A negative result does not preclude SARS-Cov-2 infection and should not be used as the sole basis for treatment or other patient management decisions. A negative result may occur with  improper specimen collection/handling, submission of specimen other than nasopharyngeal swab, presence of viral mutation(s) within the areas targeted by this assay, and inadequate number of viral copies(<138 copies/mL). A negative result must be combined with clinical observations, patient history, and epidemiological information. The expected result is  Negative.  Fact Sheet for Patients:  EntrepreneurPulse.com.au  Fact Sheet for Healthcare Providers:  IncredibleEmployment.be  This test is no t yet approved or cleared by the [place] FDA and  has been authorized for detection and/or diagnosis of SARS-CoV-2 by FDA under an Emergency Use Authorization (EUA). This EUA will remain  in effect (meaning this test can be used) for the duration of the COVID-19 declaration under Section 564(b)(1) of the Act, 21 U.S.C.section 360bbb-3(b)(1), unless the authorization is terminated  or revoked sooner.       Influenza A by PCR NEGATIVE NEGATIVE Final   Influenza B by PCR NEGATIVE NEGATIVE Final    Comment: (NOTE) The Xpert Xpress SARS-CoV-2/FLU/RSV plus assay is intended as an aid in the diagnosis of influenza from Nasopharyngeal swab specimens and should not be used as a sole basis for treatment. Nasal washings and aspirates are unacceptable for Xpert Xpress SARS-CoV-2/FLU/RSV testing.  Fact Sheet for Patients: EntrepreneurPulse.com.au  Fact Sheet for Healthcare Providers: IncredibleEmployment.be  This test is not yet approved or cleared by the [place] FDA and has been authorized for detection and/or diagnosis of SARS-CoV-2 by FDA under an Emergency Use Authorization (EUA). This EUA will remain in effect (meaning this test can be used) for the duration of the COVID-19 declaration under Section 564(b)(1) of the Act, 21 U.S.C. section 360bbb-3(b)(1), unless the authorization is terminated or revoked.  Performed at [facility], [place] [address], [place], [place] [address]   Urine Culture     Status: Abnormal   Collection Time: 01/04/22  2:31 PM   Specimen: Urine, Clean Catch  Result Value Ref Range Status   Specimen Description URINE, CLEAN CATCH  Final   Special Requests NONE  Final   Culture (A)  Final    <10,000 COLONIES/mL INSIGNIFICANT  GROWTH Performed at [facility], [place] [address], [place],  [address]    Report Status 01/05/2022 FINAL  Final     Medications:    amLODipine  2.5 mg Oral Daily   divalproex  250 mg Oral BID   ezetimibe  10 mg Oral Daily   finasteride  5 mg Oral Daily   levothyroxine  112 mcg Oral Q0600   melatonin  3 mg Oral QHS   memantine  10 mg Oral BID   mirtazapine  15 mg Oral QHS   mouth rinse  15 mL Mouth Rinse 4 times per day   pantoprazole  40 mg Oral QAC breakfast   potassium chloride  40 mEq Oral Once   Continuous Infusions:      LOS: 1 day   Charlynne Cousins  [facility]  01/06/2022, 8:42 AM

## 2022-01-06 NOTE — Progress Notes (Signed)
HOSPITAL MEDICINE OVERNIGHT EVENT NOTE    Notified by nursing that patient has been persistently agitated throughout the evening, frequently pulling at medical devices and attempting to get out of bed.  Patient is not responding to commands and frequent attempts at redirection.  Chart reviewed, patient is suffering from a subdural hematoma which is likely etiology of the patient's current behavior.  Patient is not exhibiting any new focal deficit to warrant repeat neuroimaging.  While patient is a history of documented prolonged Qtc most recent EKG shows a relatively normal QTc and therefore we will administer 2.5 mg of intravenous Haldol x1 dose.  Continue to monitor closely.  Vernelle Emerald  MD [facility]

## 2022-01-07 ENCOUNTER — Inpatient Hospital Stay (HOSPITAL_COMMUNITY): Payer: Medicare Other

## 2022-01-07 DIAGNOSIS — N1831 Chronic kidney disease, stage 3a: Secondary | ICD-10-CM | POA: Diagnosis not present

## 2022-01-07 DIAGNOSIS — I2510 Atherosclerotic heart disease of native coronary artery without angina pectoris: Secondary | ICD-10-CM | POA: Diagnosis not present

## 2022-01-07 DIAGNOSIS — S065XAA Traumatic subdural hemorrhage with loss of consciousness status unknown, initial encounter: Secondary | ICD-10-CM | POA: Diagnosis not present

## 2022-01-07 DIAGNOSIS — I10 Essential (primary) hypertension: Secondary | ICD-10-CM | POA: Diagnosis not present

## 2022-01-07 LAB — CBC WITH DIFFERENTIAL/PLATELET
Abs Immature Granulocytes: 0.14 10*3/uL — ABNORMAL HIGH (ref 0.00–0.07)
Basophils Absolute: 0 10*3/uL (ref 0.0–0.1)
Basophils Relative: 0 %
Eosinophils Absolute: 0 10*3/uL (ref 0.0–0.5)
Eosinophils Relative: 0 %
HCT: 36.6 % — ABNORMAL LOW (ref 39.0–52.0)
Hemoglobin: 12.1 g/dL — ABNORMAL LOW (ref 13.0–17.0)
Immature Granulocytes: 2 %
Lymphocytes Relative: 7 %
Lymphs Abs: 0.5 10*3/uL — ABNORMAL LOW (ref 0.7–4.0)
MCH: 31.9 pg (ref 26.0–34.0)
MCHC: 33.1 g/dL (ref 30.0–36.0)
MCV: 96.6 fL (ref 80.0–100.0)
Monocytes Absolute: 1.5 10*3/uL — ABNORMAL HIGH (ref 0.1–1.0)
Monocytes Relative: 22 %
Neutro Abs: 4.8 10*3/uL (ref 1.7–7.7)
Neutrophils Relative %: 69 %
Platelets: 55 10*3/uL — ABNORMAL LOW (ref 150–400)
RBC: 3.79 MIL/uL — ABNORMAL LOW (ref 4.22–5.81)
RDW: 13.1 % (ref 11.5–15.5)
WBC: 7 10*3/uL (ref 4.0–10.5)
nRBC: 0 % (ref 0.0–0.2)

## 2022-01-07 LAB — BASIC METABOLIC PANEL
Anion gap: 8 (ref 5–15)
BUN: 10 mg/dL (ref 8–23)
CO2: 19 mmol/L — ABNORMAL LOW (ref 22–32)
Calcium: 7.9 mg/dL — ABNORMAL LOW (ref 8.9–10.3)
Chloride: 111 mmol/L (ref 98–111)
Creatinine, Ser: 0.87 mg/dL (ref 0.61–1.24)
GFR, Estimated: >60 mL/min (ref >60)
Glucose, Bld: 90 mg/dL (ref 70–99)
Potassium: 3.2 mmol/L — ABNORMAL LOW (ref 3.5–5.1)
Sodium: 138 mmol/L (ref 135–145)

## 2022-01-07 MED ORDER — SODIUM CHLORIDE 0.9 % IV BOLUS: 1000.0000 mL | Freq: Once | INTRAVENOUS | Status: DC

## 2022-01-07 MED ORDER — SODIUM CHLORIDE 0.9 % IV BOLUS
500.0000 mL | Freq: Once | INTRAVENOUS | Status: AC
  Administered 2022-01-07: 500 mL via INTRAVENOUS

## 2022-01-07 MED ORDER — SODIUM CHLORIDE 0.9 % IV SOLN: INTRAVENOUS | Status: AC

## 2022-01-07 MED ORDER — HALOPERIDOL LACTATE 5 MG/ML IJ SOLN
1.0000 mg | Freq: Four times a day (QID) | INTRAMUSCULAR | Status: DC | PRN
  Administered 2022-01-07 – 2022-01-08 (×4): 1 mg via INTRAVENOUS
  Filled 2022-01-07 (×4): qty 1

## 2022-01-07 MED ORDER — POTASSIUM CHLORIDE 10 MEQ/100ML IV SOLN
10.0000 meq | INTRAVENOUS | Status: AC
  Administered 2022-01-07 (×5): 10 meq via INTRAVENOUS
  Filled 2022-01-07 (×5): qty 100

## 2022-01-07 MED ORDER — POLYETHYLENE GLYCOL 3350 17 G PO PACK: 17.0000 g | PACK | Freq: Every day | ORAL | Status: DC | PRN

## 2022-01-07 NOTE — Progress Notes (Signed)
[facility] PROGRESS NOTE    Progress Note  Patrick [name]  PYP:[id number] DOB: [birth year] DOA: 01/04/2022 PCP: Ginger Organ., MD     Brief Narrative:   Patrick Rica H Brents is an 88 y.o. male past medical history significant for subarachnoid hemorrhage after fall, orthostatic hypotension poorly controlled hypertension chronic urinary retention secondary to BPH dementia, chronic thrombocytopenia is brought in by family for increased weakness and poor appetite.  In the ED,CT of the head on admission showed increase size 8 mm thick subdural hematoma and a decrease in size and 10 mm thick left mixed subdural hematoma compatible with acute recent collection.  Neurosurgery was consulted who deemed him not a candidate for surgical intervention Repeated CT scan shows stable subdural hematoma, with mild intracranial mass effect no midline shift. Palliative care met with family and patient they want to continue at the measures.   Assessment/Plan:   Acute on subacute subdural hematoma ([place]): Secondary to frequent falls, including the most recent was 6 days ago. Per neurosurgery not a candidate for surgical intervention.   I have spoken to them talk with him that he will not survive CPR they seem to understand the risk and benefits. Place him n.p.o. due to his encephalopathy.  Start him on IV fluid hydration.  Acute confusional state: Patient not sleeping and I be coming startled and hallucinating. I have talked to the family that there is a bad prognosis. They relate they want to continue aggressive treatment. At high risk of aspiration. Started on Seroquel and melatonin at night Haldol as needed. He is now on oxygen this morning, check a chest x-ray and a CBC with differential.  Rule out aspiration pneumonia.  Acute on chronic urinary retention: Observe in the last admission, the wife relates he has had significant urethral bleeding. For now continue condom cath.  Hypertensive  emergency: Florinef was discontinued received IV labetalol and hydralazine in the ED and started on low-dose Norvasc.  Ambulatory dysfunction: Will probably need skilled awaiting physical therapy evaluation.  BPH: Continue Proscar.  Chronic thrombocytopenia: Stable.  Hypokalemia: Replete orally now resolved.    DVT prophylaxis: SCD Family Communication:none Status is: Observation The patient remains OBS appropriate and will d/c before 2 midnights.    Code Status:     Code Status Orders  (From admission, onward)           Start     Ordered   01/04/22 1819  Full code  Continuous        01/04/22 1820           Code Status History     Date Active Date Inactive Code Status Order ID Comments User Context   12/09/2021 1601 12/22/2021 1610 Full Code [id number]  Flora Lipps Inpatient   12/06/2021 2250 12/09/2021 1540 Full Code [id number]  Shela Leff, MD Inpatient   11/07/2021 2243 11/09/2021 2233 Full Code [id number]  Vianne Bulls, MD ED   06/23/2019 0142 06/24/2019 2243 Full Code [id number]  Etta Quill, DO ED   02/26/2019 2201 02/28/2019 1532 Full Code [id number]  Toy Baker, MD Inpatient   02/20/2019 1128 02/21/2019 1911 Full Code [id number]  Mercy Riding, MD Inpatient   02/19/2019 1938 02/20/2019 1128 DNR [id number]  Lavina Hamman, MD Inpatient   02/19/2019 1621 02/19/2019 1938 Full Code [id number]  Lavina Hamman, MD Inpatient   11/14/2018 2045 11/19/2018 1619 Full Code [id number]  Isaiah Serge, NP Inpatient      Advance Directive  Documentation    Flowsheet Row Most Recent Value  Type of Advance Directive Healthcare Power of Attorney  Pre-existing out of facility DNR order (yellow form or pink MOST form) --  "MOST" Form in Place? --         IV Access:   Peripheral IV   Procedures and diagnostic studies:   No results found.   Medical Consultants:   None.   Subjective:    Patrick [name] sleepy this morning  Objective:     Vitals:   01/06/22 2000 01/06/22 2322 01/07/22 0439 01/07/22 0811  BP: (!) 147/56 (!) 147/64 (!) 117/58 (!) 162/49  Pulse: 86 88 71 87  Resp: 20 16 15  (!) 22  Temp: 99.6 F (37.6 C) 98.5 F (36.9 C) 97.7 F (36.5 C) 99.6 F (37.6 C)  TempSrc: Axillary Axillary Axillary Axillary  SpO2: 97% 98% 100% 98%  Weight:   66.8 kg   Height:       SpO2: 98 % O2 Flow Rate (L/min): 2 L/min   Intake/Output Summary (Last 24 hours) at 01/07/2022 0927 Last data filed at 01/07/2022 0443 Gross per 24 hour  Intake 1150.43 ml  Output 700 ml  Net 450.43 ml    Filed Weights   01/05/22 2006 01/06/22 0031 01/07/22 0439  Weight: 63.2 kg 62.4 kg 66.8 kg    Exam: General exam: In no acute distress. Respiratory system: Good air movement and clear to auscultation. Cardiovascular system: S1 & S2 heard, RRR. No JVD. Gastrointestinal system: Abdomen is nondistended, soft and nontender.  Extremities: No pedal edema. Skin: No rashes, lesions or ulcers Psychiatry: Judgement and insight appear normal. Mood & affect appropriate.  Data Reviewed:    Labs: Basic Metabolic Panel: Recent Labs  Lab 01/04/22 1225  NA 138  K 3.4*  CL 104  CO2 25  GLUCOSE 105*  BUN 10  CREATININE 1.00  CALCIUM 8.7*    GFR Estimated Creatinine Clearance: 44.5 mL/min (by C-G formula based on SCr of 1 mg/dL). Liver Function Tests: Recent Labs  Lab 01/04/22 1225  AST 17  ALT 15  ALKPHOS 55  BILITOT 1.3*  PROT 6.3*  ALBUMIN 3.7    No results for input(s): "LIPASE", "AMYLASE" in the last 168 hours. No results for input(s): "AMMONIA" in the last 168 hours. Coagulation profile No results for input(s): "INR", "PROTIME" in the last 168 hours. COVID-19 Labs  No results for input(s): "DDIMER", "FERRITIN", "LDH", "CRP" in the last 72 hours.  Lab Results  Component Value Date   SARSCOV2NAA NEGATIVE 01/04/2022   SARSCOV2NAA NEGATIVE 06/28/2021   SARSCOV2NAA NEGATIVE 06/23/2019   SARSCOV2NAA NEGATIVE  03/25/2019    CBC: Recent Labs  Lab 01/04/22 1225  WBC 7.2  NEUTROABS 3.5  HGB 14.1  HCT 42.4  MCV 97.2  PLT 72*    Cardiac Enzymes: No results for input(s): "CKTOTAL", "CKMB", "CKMBINDEX", "TROPONINI" in the last 168 hours. BNP (last 3 results) No results for input(s): "PROBNP" in the last 8760 hours. CBG: Recent Labs  Lab 01/04/22 1228  GLUCAP 92    D-Dimer: No results for input(s): "DDIMER" in the last 72 hours. Hgb A1c: No results for input(s): "HGBA1C" in the last 72 hours. Lipid Profile: No results for input(s): "CHOL", "HDL", "LDLCALC", "TRIG", "CHOLHDL", "LDLDIRECT" in the last 72 hours. Thyroid function studies: No results for input(s): "TSH", "T4TOTAL", "T3FREE", "THYROIDAB" in the last 72 hours.  Invalid input(s): "FREET3" Anemia work up: No results for input(s): "VITAMINB12", "FOLATE", "FERRITIN", "TIBC", "IRON", "RETICCTPCT" in the  last 72 hours. Sepsis Labs: Recent Labs  Lab 01/04/22 1225  WBC 7.2    Microbiology Recent Results (from the past 240 hour(s))  Resp Panel by RT-PCR (Flu A&B, Covid) Anterior Nasal Swab     Status: None   Collection Time: 01/04/22 12:10 PM   Specimen: Anterior Nasal Swab  Result Value Ref Range Status   SARS Coronavirus 2 by RT PCR NEGATIVE NEGATIVE Final    Comment: (NOTE) SARS-CoV-2 target nucleic acids are NOT DETECTED.  The SARS-CoV-2 RNA is generally detectable in upper respiratory specimens during the acute phase of infection. The lowest concentration of SARS-CoV-2 viral copies this assay can detect is 138 copies/mL. A negative result does not preclude SARS-Cov-2 infection and should not be used as the sole basis for treatment or other patient management decisions. A negative result may occur with  improper specimen collection/handling, submission of specimen other than nasopharyngeal swab, presence of viral mutation(s) within the areas targeted by this assay, and inadequate number of viral copies(<138  copies/mL). A negative result must be combined with clinical observations, patient history, and epidemiological information. The expected result is Negative.  Fact Sheet for Patients:  EntrepreneurPulse.com.au  Fact Sheet for Healthcare Providers:  IncredibleEmployment.be  This test is no t yet approved or cleared by the [place] FDA and  has been authorized for detection and/or diagnosis of SARS-CoV-2 by FDA under an Emergency Use Authorization (EUA). This EUA will remain  in effect (meaning this test can be used) for the duration of the COVID-19 declaration under Section 564(b)(1) of the Act, 21 U.S.C.section 360bbb-3(b)(1), unless the authorization is terminated  or revoked sooner.       Influenza A by PCR NEGATIVE NEGATIVE Final   Influenza B by PCR NEGATIVE NEGATIVE Final    Comment: (NOTE) The Xpert Xpress SARS-CoV-2/FLU/RSV plus assay is intended as an aid in the diagnosis of influenza from Nasopharyngeal swab specimens and should not be used as a sole basis for treatment. Nasal washings and aspirates are unacceptable for Xpert Xpress SARS-CoV-2/FLU/RSV testing.  Fact Sheet for Patients: EntrepreneurPulse.com.au  Fact Sheet for Healthcare Providers: IncredibleEmployment.be  This test is not yet approved or cleared by the [place] FDA and has been authorized for detection and/or diagnosis of SARS-CoV-2 by FDA under an Emergency Use Authorization (EUA). This EUA will remain in effect (meaning this test can be used) for the duration of the COVID-19 declaration under Section 564(b)(1) of the Act, 21 U.S.C. section 360bbb-3(b)(1), unless the authorization is terminated or revoked.  Performed at [facility], [place] [address] [place], [place] [address]   Urine Culture     Status: Abnormal   Collection Time: 01/04/22  2:31 PM   Specimen: Urine, Clean Catch  Result Value Ref Range  Status   Specimen Description URINE, CLEAN CATCH  Final   Special Requests NONE  Final   Culture (A)  Final    <10,000 COLONIES/mL INSIGNIFICANT GROWTH Performed at [facility], [place] [address], [place], [place] [address]    Report Status 01/05/2022 FINAL  Final     Medications:    amLODipine  2.5 mg Oral Daily   ezetimibe  10 mg Oral Daily   finasteride  5 mg Oral Daily   levothyroxine  112 mcg Oral Q0600   melatonin  3 mg Oral QHS   memantine  10 mg Oral BID   mirtazapine  15 mg Oral QHS   mouth rinse  15 mL Mouth Rinse 4 times  per day   pantoprazole  40 mg Oral QAC breakfast   potassium chloride  40 mEq Oral Once   Continuous Infusions:  sodium chloride 75 mL/hr at 01/07/22 0443   fluconazole (DIFLUCAN) IV         LOS: 2 days   Charlynne Cousins  [facility]  01/07/2022, 9:27 AM

## 2022-01-08 DIAGNOSIS — I2510 Atherosclerotic heart disease of native coronary artery without angina pectoris: Secondary | ICD-10-CM | POA: Diagnosis not present

## 2022-01-08 DIAGNOSIS — N1831 Chronic kidney disease, stage 3a: Secondary | ICD-10-CM | POA: Diagnosis not present

## 2022-01-08 DIAGNOSIS — I10 Essential (primary) hypertension: Secondary | ICD-10-CM | POA: Diagnosis not present

## 2022-01-08 DIAGNOSIS — S065XAA Traumatic subdural hemorrhage with loss of consciousness status unknown, initial encounter: Secondary | ICD-10-CM | POA: Diagnosis not present

## 2022-01-08 LAB — CBC WITH DIFFERENTIAL/PLATELET
Abs Immature Granulocytes: 0.14 10*3/uL — ABNORMAL HIGH (ref 0.00–0.07)
Basophils Absolute: 0 10*3/uL (ref 0.0–0.1)
Basophils Relative: 0 %
Eosinophils Absolute: 0.1 10*3/uL (ref 0.0–0.5)
Eosinophils Relative: 1 %
HCT: 35 % — ABNORMAL LOW (ref 39.0–52.0)
Hemoglobin: 11.6 g/dL — ABNORMAL LOW (ref 13.0–17.0)
Immature Granulocytes: 2 %
Lymphocytes Relative: 11 %
Lymphs Abs: 0.8 10*3/uL (ref 0.7–4.0)
MCH: 32 pg (ref 26.0–34.0)
MCHC: 33.1 g/dL (ref 30.0–36.0)
MCV: 96.4 fL (ref 80.0–100.0)
Monocytes Absolute: 1.4 10*3/uL — ABNORMAL HIGH (ref 0.1–1.0)
Monocytes Relative: 21 %
Neutro Abs: 4.5 10*3/uL (ref 1.7–7.7)
Neutrophils Relative %: 65 %
Platelets: 55 10*3/uL — ABNORMAL LOW (ref 150–400)
RBC: 3.63 MIL/uL — ABNORMAL LOW (ref 4.22–5.81)
RDW: 12.9 % (ref 11.5–15.5)
WBC: 6.9 10*3/uL (ref 4.0–10.5)
nRBC: 0 % (ref 0.0–0.2)

## 2022-01-08 LAB — BASIC METABOLIC PANEL
Anion gap: 9 (ref 5–15)
BUN: 9 mg/dL (ref 8–23)
CO2: 18 mmol/L — ABNORMAL LOW (ref 22–32)
Calcium: 7.9 mg/dL — ABNORMAL LOW (ref 8.9–10.3)
Chloride: 110 mmol/L (ref 98–111)
Creatinine, Ser: 0.9 mg/dL (ref 0.61–1.24)
GFR, Estimated: >60 mL/min (ref >60)
Glucose, Bld: 81 mg/dL (ref 70–99)
Potassium: 3.5 mmol/L (ref 3.5–5.1)
Sodium: 137 mmol/L (ref 135–145)

## 2022-01-08 MED ORDER — SODIUM CHLORIDE 0.9 % IV SOLN: INTRAVENOUS | Status: DC

## 2022-01-08 MED ORDER — SODIUM CHLORIDE 0.9 % IV BOLUS
500.0000 mL | Freq: Once | INTRAVENOUS | Status: AC
  Administered 2022-01-08: 500 mL via INTRAVENOUS

## 2022-01-08 MED ORDER — POTASSIUM CHLORIDE 10 MEQ/100ML IV SOLN
10.0000 meq | INTRAVENOUS | Status: AC
  Administered 2022-01-08 (×5): 10 meq via INTRAVENOUS
  Filled 2022-01-08 (×5): qty 100

## 2022-01-08 MED ORDER — PANTOPRAZOLE 2 MG/ML SUSPENSION
40.0000 mg | Freq: Every day | ORAL | Status: DC
Start: 2022-01-08 — End: 2022-01-09
  Filled 2022-01-08: qty 20

## 2022-01-08 NOTE — Progress Notes (Signed)
[facility] PROGRESS NOTE    Progress Note  Patrick [name]  JKD:[id number] DOB: 1926/11/02 DOA: 01/04/2022 PCP: Ginger Organ., MD     Brief Narrative:   Patrick [name] is an 88 y.o. male past medical history significant for subarachnoid hemorrhage after fall, orthostatic hypotension poorly controlled hypertension chronic urinary retention secondary to BPH dementia, chronic thrombocytopenia is brought in by family for increased weakness and poor appetite.  In the ED,CT of the head on admission showed increase size 8 mm thick subdural hematoma and a decrease in size and 10 mm thick left mixed subdural hematoma compatible with acute recent collection.  Neurosurgery was consulted who deemed him not a candidate for surgical intervention Repeated CT scan shows stable subdural hematoma, with mild intracranial mass effect no midline shift. Palliative care met with family and patient they want to continue at the measures.   Assessment/Plan:   Acute on subacute subdural hematoma ([place]): Secondary to frequent falls, including the most recent was 6 days ago. Per neurosurgery not a candidate for surgical intervention.   I have spoken to them talk with him that he will not survive CPR they seem to understand the risk and benefits. Place him n.p.o. due to his encephalopathy.  Start him on IV fluid hydration. Physical therapy evaluated the patient recommended inpatient rehab and the patient is in acute confusional state they are reevaluating him.  Acute confusional state: Patient not sleeping and I be coming startled and hallucinating. I have talked to the family that there is a bad prognosis. They relate they want to continue aggressive treatment. Chest x-ray showed no acute findings. Continue Haldol as needed. Continue melatonin and Haldol as needed, he is more awake today but still sleepy we will keep him n.p.o. No leukocytosis satting greater 95% on room air.  Acute on chronic  urinary retention: Observe in the last admission, the wife relates he has had significant urethral bleeding. For now continue condom cath.  Hypertensive emergency: Florinef was discontinued received IV labetalol and hydralazine in the ED and started on low-dose Norvasc.  Ambulatory dysfunction: Will probably need skilled awaiting physical therapy evaluation.  BPH: Continue Proscar.  Chronic thrombocytopenia: Stable.  Continue to monitor intermittently.  Hypokalemia: Replete orally now resolved.    DVT prophylaxis: SCD Family Communication:none Status is: Observation The patient remains OBS appropriate and will d/c before 2 midnights.    Code Status:     Code Status Orders  (From admission, onward)           Start     Ordered   01/04/22 1819  Full code  Continuous        01/04/22 1820           Code Status History     Date Active Date Inactive Code Status Order ID Comments User Context   12/09/2021 1601 12/22/2021 1610 Full Code [id number]  Flora Lipps Inpatient   12/06/2021 2250 12/09/2021 1540 Full Code [id number]  Shela Leff, MD Inpatient   11/07/2021 2243 11/09/2021 2233 Full Code [id number]  Vianne Bulls, MD ED   06/23/2019 0142 06/24/2019 2243 Full Code [id number]  Etta Quill, DO ED   02/26/2019 2201 02/28/2019 1532 Full Code [id number]  Toy Baker, MD Inpatient   02/20/2019 1128 02/21/2019 1911 Full Code [id number]  Mercy Riding, MD Inpatient   02/19/2019 1938 02/20/2019 1128 DNR [id number]  Lavina Hamman, MD Inpatient   02/19/2019 1621 02/19/2019 1938 Full Code [id number]  Posey Pronto,  Josetta Huddle, MD Inpatient   11/14/2018 2045 11/19/2018 1619 Full Code [id number]  Isaiah Serge, NP Inpatient      Advance Directive Documentation    Flowsheet Row Most Recent Value  Type of Advance Directive Healthcare Power of [place]  Pre-existing out of facility DNR order (yellow form or pink MOST form) --  "MOST" Form in Place? --         IV  Access:   Peripheral IV   Procedures and diagnostic studies:   DG CHEST PORT 1 VIEW  Result Date: 01/07/2022 CLINICAL DATA:  Shortness of breath EXAM: PORTABLE CHEST 1 VIEW COMPARISON:  01/04/2022 and prior studies FINDINGS: The cardiomediastinal silhouette is unchanged with evidence of prior CABG. There is no evidence of focal airspace disease, pulmonary edema, suspicious pulmonary nodule/mass, pleural effusion, or pneumothorax. No acute bony abnormalities are identified. IMPRESSION: No active disease. Electronically Signed   By: Margarette Canada M.D.   On: 01/07/2022 10:37     Medical Consultants:   None.   Subjective:    Patrick [name] more awake this morning still, able to answer yes or no questions  Objective:    Vitals:   01/07/22 2107 01/08/22 0014 01/08/22 0257 01/08/22 0834  BP: (!) 179/65 (!) 113/50 (!) 116/56 (!) 144/67  Pulse: 83 79 72 69  Resp: _0 Temp: 98.5 F (36.9 C) 98.8 F (37.1 C) 97.9 F (36.6 C) 98.3 F (36.8 C)  TempSrc: Oral Axillary Axillary Oral  SpO2: 100% 99% 97% 99%  Weight:      Height:       SpO2: 99 % O2 Flow Rate (L/min): 2 L/min   Intake/Output Summary (Last 24 hours) at 01/08/2022 1005 Last data filed at 01/08/2022 0558 Gross per 24 hour  Intake 2277.7 ml  Output 1100 ml  Net 1177.7 ml    Filed Weights   01/05/22 2006 01/06/22 0031 01/07/22 0439  Weight: 63.2 kg 62.4 kg 66.8 kg    Exam: General exam: In no acute distress. Respiratory system: Good air movement and clear to auscultation. Cardiovascular system: S1 & S2 heard, RRR. No JVD. Gastrointestinal system: Abdomen is nondistended, soft and nontender.  Extremities: No pedal edema. Skin: No rashes, lesions or ulcers Psychiatry: No judgment or insight of medical condition.  Data Reviewed:    Labs: Basic Metabolic Panel: Recent Labs  Lab 01/04/22 1225 01/07/22 1005 01/08/22 0208  NA 138 138 137  K 3.4* 3.2* 3.5  CL 104 111 110  CO2 25 19* 18*   GLUCOSE 105* 90 81  BUN _1 CREATININE 1.00 0.87 0.90  CALCIUM 8.7* 7.9* 7.9*    GFR Estimated Creatinine Clearance: 49.5 mL/min (by C-G formula based on SCr of 0.9 mg/dL). Liver Function Tests: Recent Labs  Lab 01/04/22 1225  AST 17  ALT 15  ALKPHOS 55  BILITOT 1.3*  PROT 6.3*  ALBUMIN 3.7    No results for input(s): "LIPASE", "AMYLASE" in the last 168 hours. No results for input(s): "AMMONIA" in the last 168 hours. Coagulation profile No results for input(s): "INR", "PROTIME" in the last 168 hours. COVID-19 Labs  No results for input(s): "DDIMER", "FERRITIN", "LDH", "CRP" in the last 72 hours.  Lab Results  Component Value Date   SARSCOV2NAA NEGATIVE 01/04/2022   SARSCOV2NAA NEGATIVE 06/28/2021   SARSCOV2NAA NEGATIVE 06/23/2019   [place] NEGATIVE 03/25/2019    CBC: Recent Labs  Lab 01/04/22 1225 01/07/22 1005 01/08/22 0208  WBC 7.2 7.0 6.9  NEUTROABS 3.5 4.8 4.5  HGB 14.1 12.1* 11.6*  HCT 42.4 36.6* 35.0*  MCV 97.2 96.6 96.4  PLT 72* 55* 55*    Cardiac Enzymes: No results for input(s): "CKTOTAL", "CKMB", "CKMBINDEX", "TROPONINI" in the last 168 hours. BNP (last 3 results) No results for input(s): "PROBNP" in the last 8760 hours. CBG: Recent Labs  Lab 01/04/22 1228  GLUCAP 92    D-Dimer: No results for input(s): "DDIMER" in the last 72 hours. Hgb A1c: No results for input(s): "HGBA1C" in the last 72 hours. Lipid Profile: No results for input(s): "CHOL", "HDL", "LDLCALC", "TRIG", "CHOLHDL", "LDLDIRECT" in the last 72 hours. Thyroid function studies: No results for input(s): "TSH", "T4TOTAL", "T3FREE", "THYROIDAB" in the last 72 hours.  Invalid input(s): "FREET3" Anemia work up: No results for input(s): "VITAMINB12", "FOLATE", "FERRITIN", "TIBC", "IRON", "RETICCTPCT" in the last 72 hours. Sepsis Labs: Recent Labs  Lab 01/04/22 1225 01/07/22 1005 01/08/22 0208  WBC 7.2 7.0 6.9    Microbiology Recent Results (from the past 240  hour(s))  Resp Panel by RT-PCR (Flu A&B, Covid) Anterior Nasal Swab     Status: None   Collection Time: 01/04/22 12:10 PM   Specimen: Anterior Nasal Swab  Result Value Ref Range Status   SARS Coronavirus 2 by RT PCR NEGATIVE NEGATIVE Final    Comment: (NOTE) SARS-CoV-2 target nucleic acids are NOT DETECTED.  The SARS-CoV-2 RNA is generally detectable in upper respiratory specimens during the acute phase of infection. The lowest concentration of SARS-CoV-2 viral copies this assay can detect is 138 copies/mL. A negative result does not preclude SARS-Cov-2 infection and should not be used as the sole basis for treatment or other patient management decisions. A negative result may occur with  improper specimen collection/handling, submission of specimen other than nasopharyngeal swab, presence of viral mutation(s) within the areas targeted by this assay, and inadequate number of viral copies(<138 copies/mL). A negative result must be combined with clinical observations, patient history, and epidemiological information. The expected result is Negative.  Fact Sheet for Patients:  EntrepreneurPulse.com.au  Fact Sheet for Healthcare Providers:  IncredibleEmployment.be  This test is no t yet approved or cleared by the [place] FDA and  has been authorized for detection and/or diagnosis of SARS-CoV-2 by FDA under an Emergency Use Authorization (EUA). This EUA will remain  in effect (meaning this test can be used) for the duration of the COVID-19 declaration under Section 564(b)(1) of the Act, 21 U.S.C.section 360bbb-3(b)(1), unless the authorization is terminated  or revoked sooner.       Influenza A by PCR NEGATIVE NEGATIVE Final   Influenza B by PCR NEGATIVE NEGATIVE Final    Comment: (NOTE) The Xpert Xpress SARS-CoV-2/FLU/RSV plus assay is intended as an aid in the diagnosis of influenza from Nasopharyngeal swab specimens and should not be  used as a sole basis for treatment. Nasal washings and aspirates are unacceptable for Xpert Xpress SARS-CoV-2/FLU/RSV testing.  Fact Sheet for Patients: EntrepreneurPulse.com.au  Fact Sheet for Healthcare Providers: IncredibleEmployment.be  This test is not yet approved or cleared by the [place] FDA and has been authorized for detection and/or diagnosis of SARS-CoV-2 by FDA under an Emergency Use Authorization (EUA). This EUA will remain in effect (meaning this test can be used) for the duration of the COVID-19 declaration under Section 564(b)(1) of the Act, 21 U.S.C. section 360bbb-3(b)(1), unless the authorization is terminated or revoked.  Performed at [facility], [place] [address] [place], [place] [address]   Urine Culture  Status: Abnormal   Collection Time: 01/04/22  2:31 PM   Specimen: Urine, Clean Catch  Result Value Ref Range Status   Specimen Description URINE, CLEAN CATCH  Final   Special Requests NONE  Final   Culture (A)  Final    <10,000 COLONIES/mL INSIGNIFICANT GROWTH Performed at [facility], [address] [place], [place] [address]    Report Status 01/05/2022 FINAL  Final     Medications:    amLODipine  2.5 mg Oral Daily   ezetimibe  10 mg Oral Daily   finasteride  5 mg Oral Daily   levothyroxine  112 mcg Oral Q0600   melatonin  3 mg Oral QHS   memantine  10 mg Oral BID   mirtazapine  15 mg Oral QHS   mouth rinse  15 mL Mouth Rinse 4 times per day   pantoprazole  40 mg Oral QAC breakfast   Continuous Infusions:  sodium chloride Stopped (01/08/22 0935)   fluconazole (DIFLUCAN) IV Stopped (01/07/22 1115)       LOS: 3 days   Charlynne Cousins  [facility]  01/08/2022, 10:05 AM

## 2022-01-08 NOTE — Progress Notes (Signed)
Daily Progress Note   Patient Name: Patrick [name]       Date: 01/08/2022 DOB: 06/18/1926  Age: 88 y.o. MRN#: [id number] Attending Physician: Charlynne Cousins, MD Primary Care Physician: Ginger Organ., MD Admit Date: 01/04/2022  Reason for Consultation/Follow-up: Establishing goals of care  Subjective: Medical records reviewed including progress notes, labs, imaging. Patient assessed at the bedside. Discussed with RN.  His wife Patrick [name] is present visiting.  Created space and opportunity for patient and family's thoughts and feelings on his current illness.  Patrick [name] is glad he is more alert today and Patrick [name] gives me a thumbs up when asked how he feels.  He then drifts back into sleep and does not participate further in the goals of care discussion.  Patrick [name] shares with me that she has made the decision for DNR yesterday and feels this is the right thing to do.  I agree given his current medical status.  We also discussed that he would be a poor candidate for feeding tube and she is very hopeful that he will be able to increase oral intake on his own.  She wonders about IV nutrition and we discussed that TPN is a last resort and really only a bridge to long-term solution such as feeding tubes.  She tells me she is reaching for anything and everything that could help him get better.  Emotional support and therapeutic listening was provided.  Followed up on her thoughts regarding outpatient palliative care referral.  We also discussed home health support if indicated.  Patrick [name] feels that they would likely benefit from outpatient palliative care, maybe even hospice care, and she is open to discussing further pending his clinical course.  Questions and concerns addressed. PMT will continue to  support holistically.   Length of Stay: 3   Physical Exam Vitals and nursing note reviewed.  Constitutional:      Appearance: He is ill-appearing.     Interventions: Nasal cannula in place.  Cardiovascular:     Rate and Rhythm: Normal rate.  Pulmonary:     Effort: Pulmonary effort is normal.  Skin:    General: Skin is warm and dry.  Neurological:     Mental Status: He is lethargic.             Vital Signs: BP Marland Kitchen)  144/67 (BP Location: Left Arm)   Pulse 69   Temp 98.3 F (36.8 C) (Oral)   Resp 15   Ht 6' (1.829 m)   Wt 66.8 kg   SpO2 99%   BMI 19.97 kg/m  SpO2: SpO2: 99 % O2 Device: O2 Device: Room Air O2 Flow Rate: O2 Flow Rate (L/min): 2 L/min      Palliative Assessment/Data:    Palliative Care Assessment & Plan   Patient Profile: 88 y.o. male  with past medical history of  recent subarachnoid hemorrhage after a fall, orthostatic hypotension, poorly controlled HTN, frequent falls, chronic urinary retention probably secondary to BPH, dementia with behavioral symptoms, chronic thrombocytopenia, chronic peripheral neuropathy, CAD admitted on 01/04/2022 with increased weakness, poor appetite, recurrent falls.    Patient with another fall 6 days ago and admitted with acute on subacute-chronic subdural hematoma. Patient has been admitted 4 times in the past 6 months. PMT has been consulted to assist with goals of care conversation.  Assessment: Goals of care conversation Acute on subacute subdural hematoma Acute on chronic urinary retention Intermittent confusion Dementia  Recommendations/Plan: DNR Continue full scope treatment Ongoing goals of care discussions pending clinical course, patient would benefit from palliative care follow-up at discharge [name] again requests treatment of bacteriuria/small amount of colonies grown on urine culture Psychosocial and emotional support provided PMT will continue to follow incrementally and support as  needed   Prognosis:  Unable to determine  Discharge Planning: To Be Determined  Care plan was discussed with patient, patient's wife   Total time: I spent 35 minutes in the care of the patient today in the above activities and documenting the encounter.   Patrick [name] Patrick Litter, PA-C  Palliative Medicine Team Team phone # [phone]  Thank you for allowing the Palliative Medicine Team to assist in the care of this patient. Please utilize secure chat with additional questions, if there is no response within 30 minutes please call the above phone number.  Palliative Medicine Team providers are available by phone from 7am to 7pm daily and can be reached through the team cell phone.  Should this patient require assistance outside of these hours, please call the patient's attending physician.

## 2022-01-09 ENCOUNTER — Inpatient Hospital Stay (HOSPITAL_COMMUNITY): Payer: Medicare Other

## 2022-01-09 DIAGNOSIS — I10 Essential (primary) hypertension: Secondary | ICD-10-CM | POA: Diagnosis not present

## 2022-01-09 DIAGNOSIS — N1831 Chronic kidney disease, stage 3a: Secondary | ICD-10-CM | POA: Diagnosis not present

## 2022-01-09 DIAGNOSIS — S065XAA Traumatic subdural hemorrhage with loss of consciousness status unknown, initial encounter: Secondary | ICD-10-CM | POA: Diagnosis not present

## 2022-01-09 DIAGNOSIS — I2510 Atherosclerotic heart disease of native coronary artery without angina pectoris: Secondary | ICD-10-CM | POA: Diagnosis not present

## 2022-01-09 LAB — BASIC METABOLIC PANEL
Anion gap: 12 (ref 5–15)
BUN: 9 mg/dL (ref 8–23)
CO2: 17 mmol/L — ABNORMAL LOW (ref 22–32)
Calcium: 8.2 mg/dL — ABNORMAL LOW (ref 8.9–10.3)
Chloride: 105 mmol/L (ref 98–111)
Creatinine, Ser: 0.93 mg/dL (ref 0.61–1.24)
GFR, Estimated: >60 mL/min (ref >60)
Glucose, Bld: 102 mg/dL — ABNORMAL HIGH (ref 70–99)
Potassium: 3.6 mmol/L (ref 3.5–5.1)
Sodium: 134 mmol/L — ABNORMAL LOW (ref 135–145)

## 2022-01-09 MED ORDER — LORAZEPAM 2 MG/ML PO CONC: 1.0000 mg | ORAL | Status: DC | PRN

## 2022-01-09 MED ORDER — HALOPERIDOL LACTATE 2 MG/ML PO CONC: 0.5000 mg | ORAL | Status: DC | PRN

## 2022-01-09 MED ORDER — HALOPERIDOL LACTATE 5 MG/ML IJ SOLN
0.5000 mg | INTRAMUSCULAR | Status: DC | PRN
  Administered 2022-01-11: 0.5 mg via INTRAVENOUS
  Filled 2022-01-09: qty 1

## 2022-01-09 MED ORDER — LORAZEPAM 2 MG/ML IJ SOLN
1.0000 mg | INTRAMUSCULAR | Status: DC | PRN
  Administered 2022-01-09 – 2022-01-10 (×2): 1 mg via INTRAVENOUS
  Filled 2022-01-09 (×2): qty 1

## 2022-01-09 MED ORDER — LORAZEPAM 1 MG PO TABS: 1.0000 mg | ORAL_TABLET | ORAL | Status: DC | PRN

## 2022-01-09 MED ORDER — GLYCOPYRROLATE 0.2 MG/ML IJ SOLN
0.2000 mg | INTRAMUSCULAR | Status: DC | PRN
Start: 2022-01-09 — End: 2022-01-10
  Administered 2022-01-09 (×2): 0.2 mg via SUBCUTANEOUS
  Filled 2022-01-09 (×3): qty 1

## 2022-01-09 MED ORDER — SODIUM BICARBONATE 8.4 % IV SOLN
INTRAVENOUS | Status: DC
  Filled 2022-01-09: qty 1000

## 2022-01-09 MED ORDER — ACETAMINOPHEN 650 MG RE SUPP
650.0000 mg | RECTAL | Status: DC | PRN
  Administered 2022-01-09: 650 mg via RECTAL
  Filled 2022-01-09: qty 1

## 2022-01-09 MED ORDER — HALOPERIDOL 0.5 MG PO TABS: 0.5000 mg | ORAL_TABLET | ORAL | Status: DC | PRN

## 2022-01-09 MED ORDER — LABETALOL HCL 5 MG/ML IV SOLN: 10.0000 mg | INTRAVENOUS | Status: DC | PRN

## 2022-01-09 MED ORDER — MORPHINE BOLUS VIA INFUSION
1.0000 mg | INTRAVENOUS | Status: DC | PRN
  Administered 2022-01-09 – 2022-01-11 (×9): 1 mg via INTRAVENOUS

## 2022-01-09 MED ORDER — SODIUM CHLORIDE 0.9 % IV SOLN
250.0000 mL | INTRAVENOUS | Status: DC | PRN
  Administered 2022-01-09 – 2022-01-10 (×2): 250 mL via INTRAVENOUS

## 2022-01-09 MED ORDER — SODIUM CHLORIDE 0.9% FLUSH: 3.0000 mL | INTRAVENOUS | Status: DC | PRN

## 2022-01-09 MED ORDER — MORPHINE 100MG IN NS 100ML (1MG/ML) PREMIX INFUSION
1.0000 mg/h | INTRAVENOUS | Status: DC
  Administered 2022-01-09: 1 mg/h via INTRAVENOUS
  Filled 2022-01-09: qty 100

## 2022-01-09 MED ORDER — SODIUM CHLORIDE 0.9% FLUSH
3.0000 mL | Freq: Two times a day (BID) | INTRAVENOUS | Status: DC
  Administered 2022-01-09 – 2022-01-10 (×2): 3 mL via INTRAVENOUS

## 2022-01-09 NOTE — Progress Notes (Signed)
Physical Therapy Treatment Patient Details Name: Patrick [name] MRN: [id number] DOB: Sep 08, 1926 Today's Date: 01/09/2022   History of Present Illness 88 y.o. M admitted on 01/04/22 due to fall 6 days ago, presenting now with increased weakness, cognitive concerns, and difficulty with mobility. Recently admitted on 12/06/21 due to a fall with subdural hematoma's. CT on 8/23 shows 2 subdural hematoma's with increased size compatible with acute recent collection.  PMH significant for CAD, CKD stage IIIa, chronic thrombocytopenia, hypertension, hyperlipidemia, hypothyroidism, BPH, GERD, gout, chronic pain, dementia, depression.    PT Comments    Pt progressing towards physical therapy goals. Goal of session was OOB to chair, and pt tolerated well with use of Stedy for support. Pt lethargic but able to participate fairly well. Motor planning deficits, and difficulty ending task (washing face, initiating sit from standing). Will continue to follow and progress as able per POC.     Recommendations for follow up therapy are one component of a multi-disciplinary discharge planning process, led by the attending physician.  Recommendations may be updated based on patient status, additional functional criteria and insurance authorization.  Follow Up Recommendations  Acute inpatient rehab (3hours/day)     Assistance Recommended at Discharge Frequent or constant Supervision/Assistance  Patient can return home with the following Two people to help with walking and/or transfers;Two people to help with bathing/dressing/bathroom;Assistance with cooking/housework;Assist for transportation;Help with stairs or ramp for entrance   Equipment Recommendations  Other (comment) (TBD)    Recommendations for Other Services Rehab consult     Precautions / Restrictions Precautions Precautions: Fall Precaution Comments: monitor BP - was orthostatic last admission Restrictions Weight Bearing Restrictions: No      Mobility  Bed Mobility Overal bed mobility: Needs Assistance Bed Mobility: Rolling, Sidelying to Sit Rolling: Max assist Sidelying to sit: Max assist, +2 for physical assistance, +2 for safety/equipment       General bed mobility comments: +2 assist to transition fully to EOB. Increased time to gain/maintain sitting balance but was able to sit unsupported with hands in lap.    Transfers Overall transfer level: Needs assistance Equipment used: Ambulation equipment used (stedy) Transfers: Sit to/from Stand Sit to Stand: Mod assist, +2 physical assistance, +2 safety/equipment           General transfer comment: Pt requiring max A for initiation of stand, but maintaining standing balance with min A for ~3-4 minutes while therapists assisted with posterior pericare. Max multimodal cues with facilitation at hips to initiate stand to sit.    Ambulation/Gait               General Gait Details: Unable to progress to gait training at this time.   Stairs             Wheelchair Mobility    Modified Rankin (Stroke Patients Only) Modified Rankin (Stroke Patients Only) Pre-Morbid Rankin Score: Slight disability Modified Rankin: Moderately severe disability     Balance Overall balance assessment: Needs assistance Sitting-balance support: Bilateral upper extremity supported, Feet supported Sitting balance-Leahy Scale: Poor Sitting balance - Comments: Min A for sitting balance EOB Postural control: Left lateral lean Standing balance support: Bilateral upper extremity supported Standing balance-Leahy Scale: Poor Standing balance comment: Reliant on stedy and external support (min A for static standing balance)                            Cognition Arousal/Alertness: Lethargic Behavior During Therapy: Flat affect Overall Cognitive  Status: History of cognitive impairments - at baseline Area of Impairment: Attention, Following commands, Problem solving                    Current Attention Level: Sustained   Following Commands: Follows one step commands with increased time     Problem Solving: Slow processing, Requires tactile cues, Difficulty sequencing General Comments: has history of dementia, however per wife he is slower with responses and having increased difficulty with sequencing at this time. No meaningful verbalizations this session, but following commands with increased time. Perseverating on washing face during session and max multimodal cues to sit following stand. Pt with good response towards wife and brief scanning to locate her. Gross L inattention        Exercises      General Comments General comments (skin integrity, edema, etc.): VSS on RA. Pt appears to be motivated by wife with greater initiation of gaze toward voice in standing and while sitting EOB. Wife present and eager to assist as needed (retrieving wash cloths, etc)      Pertinent Vitals/Pain Pain Assessment Pain Assessment: Faces Faces Pain Scale: No hurt Pain Location: General unwell feeling Pain Descriptors / Indicators: Discomfort Pain Intervention(s): Limited activity within patient's tolerance, Monitored during session, Repositioned    Home Living                          Prior Function            PT Goals (current goals can now be found in the care plan section) Acute Rehab PT Goals Patient Stated Goal: To regain independence PT Goal Formulation: With patient/family Time For Goal Achievement: 01/19/22 Potential to Achieve Goals: Good Progress towards PT goals: Progressing toward goals    Frequency    Min 3X/week      PT Plan Current plan remains appropriate    Co-evaluation PT/OT/SLP Co-Evaluation/Treatment: Yes Reason for Co-Treatment: Complexity of the patient's impairments (multi-system involvement);Necessary to address cognition/behavior during functional activity;For patient/therapist safety;To address  functional/ADL transfers PT goals addressed during session: Mobility/safety with mobility;Balance;Proper use of DME OT goals addressed during session: ADL's and self-care;Proper use of Adaptive equipment and DME      AM-PAC PT "6 Clicks" Mobility   Outcome Measure  Help needed turning from your back to your side while in a flat bed without using bedrails?: A Lot Help needed moving from lying on your back to sitting on the side of a flat bed without using bedrails?: Total Help needed moving to and from a bed to a chair (including a wheelchair)?: Total Help needed standing up from a chair using your arms (e.g., wheelchair or bedside chair)?: Total Help needed to walk in hospital room?: Total Help needed climbing 3-5 steps with a railing? : Total 6 Click Score: 7    End of Session Equipment Utilized During Treatment: Gait belt Activity Tolerance: Patient limited by fatigue Patient left: in chair;with call bell/phone within reach;with chair alarm set;with family/visitor present Nurse Communication: Mobility status PT Visit Diagnosis: Unsteadiness on feet (R26.81);Other symptoms and signs involving the nervous system (R29.898)     Time: 2703-5009 PT Time Calculation (min) (ACUTE ONLY): 40 min  Charges:  $Gait Training: 8-22 mins                     Rolinda Roan, PT, DPT Acute Rehabilitation Services Secure Chat Preferred Office: [phone]    Thelma Comp 01/09/2022,  3:24 PM

## 2022-01-09 NOTE — Progress Notes (Signed)
Occupational Therapy Treatment Patient Details Name: Patrick [name] MRN: [id number] DOB: [birth year] Today's Date: 01/09/2022   History of present illness 88 y.o. M admitted on 01/04/22 due to fall 6 days ago, presenting now with increased weakness, cognitive concerns, and difficulty with mobility. Recently admitted on 12/06/21 due to a fall with subdural hematoma's. CT on 8/23 shows 2 subdural hematoma's with increased size compatible with acute recent collection.  PMH significant for CAD, CKD stage IIIa, chronic thrombocytopenia, hypertension, hyperlipidemia, hypothyroidism, BPH, GERD, gout, chronic pain, dementia, depression.   OT comments  Pt progressing towards established OT goals. Pt following some commands this session, however, requiring assistance for initiation. Motivated by wife. Washing face at bed level, and assist to initiate, but then observed to perseverate. Pt performing sit<>stand transfers using stedy with mod A +2 and posterior pericare with total A. Maintaining standing balance with min A for ~3 minutes. Max multimodal cues for set-up and to initiate sitting following stand. Pt not observed to visually scan toward L side of environment this session. Continue to assess vision. Due to pt support and significant change in functional status, recommend discharge at AIR to optimize safety and independence in ADL.    Recommendations for follow up therapy are one component of a multi-disciplinary discharge planning process, led by the attending physician.  Recommendations may be updated based on patient status, additional functional criteria and insurance authorization.    Follow Up Recommendations  Acute inpatient rehab (3hours/day)    Assistance Recommended at Discharge Frequent or constant Supervision/Assistance  Patient can return home with the following  A lot of help with bathing/dressing/bathroom;Assistance with cooking/housework;Assist for transportation;Direct  supervision/assist for financial management;Direct supervision/assist for medications management;Help with stairs or ramp for entrance;Two people to help with walking and/or transfers   Equipment Recommendations  None recommended by OT    Recommendations for Other Services Rehab consult    Precautions / Restrictions Precautions Precautions: Fall Precaution Comments: monitor BP - was orthostatic last admission Restrictions Weight Bearing Restrictions: No       Mobility Bed Mobility Overal bed mobility: Needs Assistance Bed Mobility: Rolling, Sidelying to Sit Rolling: Max assist Sidelying to sit: Max assist, +2 for physical assistance, +2 for safety/equipment       General bed mobility comments: Max Ato manage BLE and trunk. pt initially with resistance of L knee flexion, but dangling EOB did not seem to bother pt.    Transfers Overall transfer level: Needs assistance Equipment used: Ambulation equipment used (stedy) Transfers: Sit to/from Stand Sit to Stand: Mod assist, +2 physical assistance, +2 safety/equipment           General transfer comment: Pt requiring max A for initiation of stand, but maintaining standing balance with min A for ~3-4 minutes while therapists assisted with posterior pericare. Max multimodal cues with facilitation at hips to initiate sit from standing position.     Balance Overall balance assessment: Needs assistance Sitting-balance support: Bilateral upper extremity supported, Feet supported Sitting balance-Leahy Scale: Poor Sitting balance - Comments: Min A for sitting balance EOB Postural control: Left lateral lean Standing balance support: Bilateral upper extremity supported Standing balance-Leahy Scale: Poor Standing balance comment: Reliant on stedy and external support (min A for static standing balance)                           ADL either performed or assessed with clinical judgement   ADL Overall ADL's : Needs  assistance/impaired  Grooming: Minimal assistance;Bed level Grooming Details (indicate cue type and reason): Min A to initiate, but then perseverating. Undershooting when asked to wash around eyes, and initially requiring min A for hand placement.                 Toilet Transfer: Moderate assistance;+2 for physical assistance;+2 for safety/equipment (stedy) Toilet Transfer Details (indicate cue type and reason): Simulated with stedy to recliner Toileting- Clothing Manipulation and Hygiene: Total assistance;+2 for physical assistance;+2 for safety/equipment;Sit to/from stand (Dependent for posterior pericare in standing.)       Functional mobility during ADLs: Maximal assistance;+2 for physical assistance;+2 for safety/equipment (stedy) General ADL Comments: Pt limited with all tasks due to poor balance in sitting and difficutly with standing, additionally needing assist/verbal cuing for sequencing    Extremity/Trunk Assessment Upper Extremity Assessment Upper Extremity Assessment: Generalized weakness LUE Deficits / Details: Decreased initiation to grasp stedy   Lower Extremity Assessment Lower Extremity Assessment: Defer to PT evaluation        Vision   Vision Assessment?: Vision impaired- to be further tested in functional context Additional Comments: Potentially decreased vision. Pt not tracking much this session, and not shifting gaze toward L toward therapist's voices. Pt with brief bouts of sustained visual focus on therapist   Perception     Praxis      Cognition Arousal/Alertness: Lethargic Behavior During Therapy: Flat affect Overall Cognitive Status: History of cognitive impairments - at baseline Area of Impairment: Attention, Following commands, Problem solving                   Current Attention Level: Sustained   Following Commands: Follows one step commands with increased time     Problem Solving: Slow processing, Requires tactile cues,  Difficulty sequencing General Comments: has history of dementia, however per wife he is slower with responses and having increased difficulty with sequencing at this time. No meaningful verbalizations this session, but following commands with increased time. Perseverating on washing face during session and max multimodal cues to sit following stand. Pt with good response towards wife and brief scanning to locate her. Pt not observed to scan toward L to find therapists.        Exercises      Shoulder Instructions       General Comments VSS on RA. Pt appears to be motivated by wife with greater initiation of gaze toward voice in standing and while sitting EOB. Wife present and eager to assist as needed (retrieving wash cloths, etc)    Pertinent Vitals/ Pain       Pain Assessment Pain Assessment: Faces Faces Pain Scale: No hurt Pain Intervention(s): Monitored during session  Home Living                                          Prior Functioning/Environment              Frequency  Min 2X/week        Progress Toward Goals  OT Goals(current goals can now be found in the care plan section)  Progress towards OT goals: Progressing toward goals  Acute Rehab OT Goals Patient Stated Goal: none stated by pt; wife requesting AIR OT Goal Formulation: With patient/family Time For Goal Achievement: 01/19/22 Potential to Achieve Goals: Good ADL Goals Pt Will Perform Grooming: with set-up;sitting Pt Will Perform Lower Body Bathing: with min  assist;sitting/lateral leans Pt Will Perform Upper Body Dressing: with modified independence;sitting Pt Will Perform Lower Body Dressing: with mod assist;sitting/lateral leans;sit to/from stand Pt Will Transfer to Toilet: with min assist;ambulating Pt Will Perform Toileting - Clothing Manipulation and hygiene: with min assist;sitting/lateral leans;sit to/from stand Additional ADL Goal #1: Pt will identify three fall precautions  with mod I.  Plan Discharge plan remains appropriate;Frequency remains appropriate    Co-evaluation    PT/OT/SLP Co-Evaluation/Treatment: Yes Reason for Co-Treatment: For patient/therapist safety;To address functional/ADL transfers PT goals addressed during session: Balance;Strengthening/ROM;Mobility/safety with mobility OT goals addressed during session: ADL's and self-care;Proper use of Adaptive equipment and DME      AM-PAC OT "6 Clicks" Daily Activity     Outcome Measure   Help from another person eating meals?: A Little Help from another person taking care of personal grooming?: A Little Help from another person toileting, which includes using toliet, bedpan, or urinal?: A Lot Help from another person bathing (including washing, rinsing, drying)?: A Lot Help from another person to put on and taking off regular upper body clothing?: A Lot Help from another person to put on and taking off regular lower body clothing?: Total 6 Click Score: 13    End of Session Equipment Utilized During Treatment: Gait belt  OT Visit Diagnosis: Unsteadiness on feet (R26.81);Muscle weakness (generalized) (M62.81);History of falling (Z91.81);Dizziness and giddiness (R42);Cognitive communication deficit (R41.841)   Activity Tolerance Patient tolerated treatment well   Patient Left in chair;with call bell/phone within reach;with chair alarm set;with family/visitor present   Nurse Communication Mobility status        Time: 0263-7858 OT Time Calculation (min): 39 min  Charges: OT General Charges $OT Visit: 1 Visit OT Treatments $Self Care/Home Management : 23-37 mins  Shanda Howells, OTR/L [facility] Acute Rehabilitation Office: [phone]   Lula Olszewski 01/09/2022, 2:05 PM

## 2022-01-09 NOTE — Evaluation (Signed)
Clinical/Bedside Swallow Evaluation Patient Details  Name: Patrick [name] MRN: [id number] Date of Birth: [birth year]  Today's Date: 01/09/2022 Time: SLP Start Time (ACUTE ONLY): 52 SLP Stop Time (ACUTE ONLY): 1411 SLP Time Calculation (min) (ACUTE ONLY): 37 min  Past Medical History:  Past Medical History:  Diagnosis Date   Bilateral foot-drop 12/17/2019   BPH (benign prostatic hypertrophy)    Diverticulosis    ED (erectile dysfunction)    Food impaction of esophagus w/mild gastritis 06/28/2021   Gait abnormality 12/17/2019   GERD (gastroesophageal reflux disease)    Gout    H/O multiple concussions--as teenager    Hepatitis    hx hepatitis after mono as teenager   History of kidney stones    History of skin cancer    Hyperlipidemia    Hypertension    Hypothyroidism    IHD (ischemic heart disease)    Remote CABG in 1997   Memory disorder 12/17/2019   MI, acute, non ST segment elevation ([place]) 2010   s/p cath with occluded SVG to PD that fills by collaterals and the remainder of his revasculsarization is satisfactory. "the first time they did the stent , the second time they did the graft 8 vessels"   Nocturia    Schatzki's ring--mild    Thrombocytopenia ([place]) 11/19/2018   Tremor, essential 12/17/2019   Urinary frequency    Past Surgical History:  Past Surgical History:  Procedure Laterality Date   APPENDECTOMY     BALLOON DILATION N/A 03/25/2019   Procedure: BALLOON DILATION;  Surgeon: Otis Brace, MD;  Location: [facility];  Service: Gastroenterology;  Laterality: N/A;   CARDIAC CATHETERIZATION  09/08/2008   NORMAL. EF 60%; Occluded SVG to PD that fills by collaterals and the remainder of his revascularization is satisfactory. he is managed medically.    CORONARY ARTERY BYPASS GRAFT  1997   CABG x 8   CORONARY STENT INTERVENTION N/A 11/18/2018   Procedure: CORONARY STENT INTERVENTION;  Surgeon: Sherren Mocha, MD;  Location: [facility];  Service:  Cardiovascular;  Laterality: N/A;   CYSTOSCOPY WITH INSERTION OF UROLIFT N/A 03/19/2015   Procedure: CYSTOSCOPY WITH INSERTION OF FOUR UROLIFTS;  Surgeon: Carolan Clines, MD;  Location: [facility];  Service: Urology;  Laterality: N/A;   ESOPHAGOGASTRODUODENOSCOPY (EGD) WITH PROPOFOL N/A 02/12/2017   Procedure: ESOPHAGOGASTRODUODENOSCOPY (EGD) WITH PROPOFOL;  Surgeon: Otis Brace, MD;  [facility];  Service: Gastroenterology;  Laterality: N/A;   ESOPHAGOGASTRODUODENOSCOPY (EGD) WITH PROPOFOL N/A 07/13/2018   Procedure: ESOPHAGOGASTRODUODENOSCOPY (EGD) WITH PROPOFOL;  Surgeon: Clarene Essex, MD;  [facility];  Service: Endoscopy;  Laterality: N/A;   ESOPHAGOGASTRODUODENOSCOPY (EGD) WITH PROPOFOL N/A 03/25/2019   Procedure: ESOPHAGOGASTRODUODENOSCOPY (EGD) WITH PROPOFOL;  Surgeon: Otis Brace, MD;  [facility];  Service: Gastroenterology;  Laterality: N/A;   ESOPHAGOGASTRODUODENOSCOPY (EGD) WITH PROPOFOL N/A 06/28/2021   Procedure: ESOPHAGOGASTRODUODENOSCOPY (EGD) WITH PROPOFOL;  Surgeon: Wilford Corner, MD;  [facility];  Service: Endoscopy;  Laterality: N/A;   FOREIGN BODY REMOVAL N/A 07/13/2018   Procedure: FOREIGN BODY REMOVAL;  Surgeon: Clarene Essex, MD;  [facility];  Service: Endoscopy;  Laterality: N/A;   IMPACTION REMOVAL  06/28/2021   Procedure: IMPACTION REMOVAL;  Surgeon: Wilford Corner, MD;  [facility];  Service: Endoscopy;;   LEFT HEART CATH AND CORS/GRAFTS ANGIOGRAPHY N/A 11/18/2018   Procedure: LEFT HEART CATH AND CORS/GRAFTS ANGIOGRAPHY;  Surgeon: Sherren Mocha, MD;  Location: [facility];  Service: Cardiovascular;  Laterality: N/A;   HPI:  88 y.o.  M admitted on 01/04/22 due to fall 6 days ago, presenting now with increased weakness, cognitive concerns, and difficulty with mobility. Recently admitted on 12/06/21 due to a fall with subdural hematoma's. CT on 8/23 shows 2 subdural hematoma's with increased  size compatible with acute recent collection.  PMH significant for CAD, CKD stage IIIa, chronic thrombocytopenia, hypertension, hyperlipidemia, hypothyroidism, BPH, GERD, gout, chronic pain, dementia, depression.    Assessment / Plan / Recommendation  Clinical Impression  Patrick [name] presents with a severe dysphagia that has impacted his ability to recognize food/liquid, execute the motor plan needed to initiate a swallow, and adequately protect his airway. These impairments lead to s/s of aspiration with all trials of ice and sips of water. There is poor oral recognition, no active propulsion of material, a delayed swallow response after ice/water has passively spilled into pharynx, explosive coughing.  No further food items were offered given severity of dysphagia.  Patrick [name] was at the bedside and provided helpful information about her husband's recent history. We had an honest discussion about his swallowing deficits, the trajectory of dementia and its impact on swallowing, the contraindication of feeding tubes in persons with advanced dementia. She asked very good questions which I answered to the best of my ability.  She is considering hospice as a likely necessity at this point.  Offered encouragement.  At this time, there are no safe food/liquids that Patrick [name] could eat that wouldn't cause discomfort. Recommend NPO with frequent oral care.  SLP will follow for further education as needed. D/W RN and Dr. Venetia Constable. SLP Visit Diagnosis: Dysphagia, oropharyngeal phase (R13.12)    Aspiration Risk  Severe aspiration risk    Diet Recommendation   NPO       Other  Recommendations Oral Care Recommendations: Oral care QID    Recommendations for follow up therapy are one component of a multi-disciplinary discharge planning process, led by the attending physician.  Recommendations may be updated based on patient status, additional functional criteria and insurance authorization.  Follow up  Recommendations No SLP follow up      Assistance Recommended at Discharge    Functional Status Assessment    Frequency and Duration min 1 x/week  1 week       Prognosis Prognosis for Safe Diet Advancement: Guarded      Swallow Study   General HPI: 88 y.o. M admitted on 01/04/22 due to fall 6 days ago, presenting now with increased weakness, cognitive concerns, and difficulty with mobility. Recently admitted on 12/06/21 due to a fall with subdural hematoma's. CT on 8/23 shows 2 subdural hematoma's with increased size compatible with acute recent collection.  PMH significant for CAD, CKD stage IIIa, chronic thrombocytopenia, hypertension, hyperlipidemia, hypothyroidism, BPH, GERD, gout, chronic pain, dementia, depression. Type of Study: Bedside Swallow Evaluation Diet Prior to this Study: Dysphagia 2 (chopped);Thin liquids Temperature Spikes Noted: No Respiratory Status: Room air History of Recent Intubation: No Behavior/Cognition: Alert Oral Cavity Assessment: Dried secretions Oral Care Completed by SLP: Yes Oral Cavity - Dentition: Adequate natural dentition Self-Feeding Abilities: Total assist Patient Positioning: Upright in chair Baseline Vocal Quality: Not observed Volitional Cough: Cognitively unable to elicit Volitional Swallow: Unable to elicit    Oral/Motor/Sensory Function Overall Oral Motor/Sensory Function: Other (comment) (symmetric at baseline)   Ice Chips Ice chips: Impaired Presentation: Spoon Oral Phase Impairments: Reduced labial seal;Reduced lingual movement/coordination;Poor awareness of bolus Oral Phase Functional Implications: Prolonged oral transit Pharyngeal Phase Impairments: Multiple swallows;Cough - Immediate   Thin Liquid  Thin Liquid: Impaired Presentation: Spoon Oral Phase Impairments: Reduced labial seal Oral Phase Functional Implications: Oral holding Pharyngeal  Phase Impairments: Suspected delayed Swallow;Multiple swallows;Cough - Immediate     Nectar Thick Nectar Thick Liquid: Not tested   Honey Thick Honey Thick Liquid: Not tested   Puree Puree: Not tested   Solid     Solid: Not tested      Juan Quam Laurice 01/09/2022,2:44 PM  Estill Bamberg L. Tivis Ringer, MA CCC/SLP Clinical Specialist - [place] Office number [phone]

## 2022-01-09 NOTE — Progress Notes (Signed)
He was confused this morning a CT scan was repeated that showed worsening subdural hematoma. He remains obtunded. It was discussed with the family [name] and his son and they would like to move towards comfort measures. All medications will be stopped except medications for comfort and no further lab draws. She is requesting residential hospice facility placement. We will get TOC involved.

## 2022-01-09 NOTE — Progress Notes (Signed)
Inpatient Rehab Admissions Coordinator:   Attempted to reach wife via phone to discuss CIR recommendations.  No answer and VM box full.  Will continue efforts.   Shann Medal, PT, DPT Admissions Coordinator [phone] 01/09/22  3:50 PM

## 2022-01-09 NOTE — Plan of Care (Signed)
  Problem: Education: Goal: Knowledge of General Education information will improve Description: Including pain rating scale, medication(s)/side effects and non-pharmacologic comfort measures Outcome: Not Progressing   Problem: Health Behavior/Discharge Planning: Goal: Ability to manage health-related needs will improve Outcome: Not Progressing   Problem: Clinical Measurements: Goal: Ability to maintain clinical measurements within normal limits will improve Outcome: Not Progressing Goal: Will remain free from infection Outcome: Not Progressing Goal: Diagnostic test results will improve Outcome: Not Progressing Goal: Respiratory complications will improve Outcome: Not Progressing Goal: Cardiovascular complication will be avoided Outcome: Not Progressing   Problem: Activity: Goal: Risk for activity intolerance will decrease Outcome: Not Progressing   Problem: Nutrition: Goal: Adequate nutrition will be maintained Outcome: Not Progressing   Problem: Coping: Goal: Level of anxiety will decrease Outcome: Not Progressing   Problem: Elimination: Goal: Will not experience complications related to bowel motility Outcome: Not Progressing Goal: Will not experience complications related to urinary retention Outcome: Not Progressing   Problem: Pain Managment: Goal: General experience of comfort will improve Outcome: Not Progressing   Problem: Safety: Goal: Ability to remain free from injury will improve Outcome: Not Progressing   Problem: Skin Integrity: Goal: Risk for impaired skin integrity will decrease Outcome: Not Progressing   Problem: Education: Goal: Knowledge of disease or condition will improve Outcome: Not Progressing Goal: Knowledge of secondary prevention will improve (SELECT ALL) Outcome: Not Progressing Goal: Knowledge of patient specific risk factors will improve (INDIVIDUALIZE FOR PATIENT) Outcome: Not Progressing Goal: Individualized Educational  Video(s) Outcome: Not Progressing   Problem: Health Behavior/Discharge Planning: Goal: Ability to manage health-related needs will improve Outcome: Not Progressing   Problem: Self-Care: Goal: Ability to participate in self-care as condition permits will improve Outcome: Not Progressing Goal: Verbalization of feelings and concerns over difficulty with self-care will improve Outcome: Not Progressing Goal: Ability to communicate needs accurately will improve Outcome: Not Progressing   Problem: Nutrition: Goal: Risk of aspiration will decrease Outcome: Not Progressing Goal: Dietary intake will improve Outcome: Not Progressing   Problem: Spontaneous Subarachnoid Hemorrhage Tissue Perfusion: Goal: Complications of Spontaneous Subarachnoid Hemorrhage will be minimized Outcome: Not Progressing

## 2022-01-09 NOTE — Progress Notes (Signed)
[facility] PROGRESS NOTE    Progress Note  Patrick Rica H Ogawa  XKG:[id number] DOB: 07-28-1926 DOA: 01/04/2022 PCP: Ginger Organ., MD     Brief Narrative:   Patrick [name] is an 88 y.o. male past medical history significant for subarachnoid hemorrhage after fall, orthostatic hypotension poorly controlled hypertension chronic urinary retention secondary to BPH dementia, chronic thrombocytopenia is brought in by family for increased weakness and poor appetite.  In the ED,CT of the head on admission showed increase size 8 mm thick subdural hematoma and a decrease in size and 10 mm thick left mixed subdural hematoma compatible with acute recent collection.  Neurosurgery was consulted who deemed him not a candidate for surgical intervention Repeated CT scan shows stable subdural hematoma, with mild intracranial mass effect no midline shift. Palliative care met with family and patient they want to continue at the measures.   Assessment/Plan:   Acute on subacute subdural hematoma ([place]): Secondary to frequent falls, including the most recent was 6 days ago. Per neurosurgery not a candidate for surgical intervention.   I have spoken to them talk with him that he will not survive CPR they seem to understand the risk and benefits. Place him n.p.o. due to his encephalopathy.  Start him on IV fluid hydration. Physical therapy evaluated the patient recommended inpatient rehab and the patient is in acute confusional state they are reevaluating him. Repeated CT of the head patient is sleepy this morning.  Acute confusional state: Patient not sleeping and I be coming startled and hallucinating. I have talked to the family that there is a bad prognosis. They relate they want to continue aggressive treatment. Chest x-ray showed no acute findings. Continue Haldol as needed. Continue melatonin and Haldol as needed, he is more awake today but still sleepy we will keep him n.p.o. No  leukocytosis satting greater 95% on room air. Continues to be sleepy this morning, will get a CT of the head rule out worsening subdural hematoma.  Acute on chronic urinary retention: Observe in the last admission, the wife relates he has had significant urethral bleeding. For now continue condom cath.  Hypertensive emergency: Florinef was discontinued received IV labetalol and hydralazine in the ED and started on low-dose Norvasc.  Ambulatory dysfunction: Will probably need skilled awaiting physical therapy evaluation.  BPH: Continue Proscar.  Chronic thrombocytopenia: Stable.  Continue to monitor intermittently.  Hypokalemia: Replete orally now resolved.    DVT prophylaxis: SCD Family Communication:none Status is: Observation The patient remains OBS appropriate and will d/c before 2 midnights.    Code Status:     Code Status Orders  (From admission, onward)           Start     Ordered   01/04/22 1819  Full code  Continuous        01/04/22 1820           Code Status History     Date Active Date Inactive Code Status Order ID Comments User Context   12/09/2021 1601 12/22/2021 1610 Full Code [id number]  Flora Lipps Inpatient   12/06/2021 2250 12/09/2021 1540 Full Code [id number]  Shela Leff, MD Inpatient   11/07/2021 2243 11/09/2021 2233 Full Code [id number]  Vianne Bulls, MD ED   06/23/2019 0142 06/24/2019 2243 Full Code [id number]  Etta Quill, DO ED   02/26/2019 2201 02/28/2019 1532 Full Code [id number]  Toy Baker, MD Inpatient   02/20/2019 1128 02/21/2019 1911 Full Code [id number]  Wendee Beavers  T, MD Inpatient   02/19/2019 1938 02/20/2019 1128 DNR [id number]  Lavina Hamman, MD Inpatient   02/19/2019 1621 02/19/2019 1938 Full Code [id number]  Lavina Hamman, MD Inpatient   11/14/2018 2045 11/19/2018 1619 Full Code [id number]  Isaiah Serge, NP Inpatient      Advance Directive Documentation    Flowsheet Row Most Recent Value  Type of  Advance Directive Healthcare Power of [place]  Pre-existing out of facility DNR order (yellow form or pink MOST form) --  "MOST" Form in Place? --         IV Access:   Peripheral IV   Procedures and diagnostic studies:   DG CHEST PORT 1 VIEW  Result Date: 01/07/2022 CLINICAL DATA:  Shortness of breath EXAM: PORTABLE CHEST 1 VIEW COMPARISON:  01/04/2022 and prior studies FINDINGS: The cardiomediastinal silhouette is unchanged with evidence of prior CABG. There is no evidence of focal airspace disease, pulmonary edema, suspicious pulmonary nodule/mass, pleural effusion, or pneumothorax. No acute bony abnormalities are identified. IMPRESSION: No active disease. Electronically Signed   By: Margarette Canada M.D.   On: 01/07/2022 10:37     Medical Consultants:   None.   Subjective:    Patrick Rica H Marty sleepy this morning.  Objective:    Vitals:   01/08/22 1937 01/08/22 2321 01/09/22 0245 01/09/22 0805  BP: (!) 197/86 (!) 162/69 (!) 174/75 (!) 172/68  Pulse: (!) 110 95 97 95  Resp: _0 Temp: 98.3 F (36.8 C) 98.9 F (37.2 C) 98.5 F (36.9 C) 98.7 F (37.1 C)  TempSrc: Oral Oral Axillary Oral  SpO2: 97% 96%  96%  Weight:      Height:       SpO2: 96 % O2 Flow Rate (L/min): 2 L/min   Intake/Output Summary (Last 24 hours) at 01/09/2022 1017 Last data filed at 01/09/2022 0600 Gross per 24 hour  Intake 1597.29 ml  Output 2325 ml  Net -727.71 ml    Filed Weights   01/05/22 2006 01/06/22 0031 01/07/22 0439  Weight: 63.2 kg 62.4 kg 66.8 kg    Exam: General exam: In no acute distress. Respiratory system: Good air movement and clear to auscultation. Cardiovascular system: S1 & S2 heard, RRR. No JVD. Gastrointestinal system: Abdomen is nondistended, soft and nontender.  Extremities: No pedal edema. Skin: No rashes, lesions or ulcers Psychiatry: No judgment or insight of medical condition.  Data Reviewed:    Labs: Basic Metabolic Panel: Recent Labs  Lab  01/04/22 1225 01/07/22 1005 01/08/22 0208 01/09/22 0402  NA 138 138 137 134*  K 3.4* 3.2* 3.5 3.6  CL 104 111 110 105  CO2 25 19* 18* 17*  GLUCOSE 105* 90 81 102*  BUN _1 CREATININE 1.00 0.87 0.90 0.93  CALCIUM 8.7* 7.9* 7.9* 8.2*    GFR Estimated Creatinine Clearance: 47.9 mL/min (by C-G formula based on SCr of 0.93 mg/dL). Liver Function Tests: Recent Labs  Lab 01/04/22 1225  AST 17  ALT 15  ALKPHOS 55  BILITOT 1.3*  PROT 6.3*  ALBUMIN 3.7    No results for input(s): "LIPASE", "AMYLASE" in the last 168 hours. No results for input(s): "AMMONIA" in the last 168 hours. Coagulation profile No results for input(s): "INR", "PROTIME" in the last 168 hours. COVID-19 Labs  No results for input(s): "DDIMER", "FERRITIN", "LDH", "CRP" in the last 72 hours.  Lab Results  Component Value Date   [place] NEGATIVE 01/04/2022   [place] NEGATIVE 06/28/2021  [place] NEGATIVE 06/23/2019   [place] NEGATIVE 03/25/2019    CBC: Recent Labs  Lab 01/04/22 1225 01/07/22 1005 01/08/22 0208  WBC 7.2 7.0 6.9  NEUTROABS 3.5 4.8 4.5  HGB 14.1 12.1* 11.6*  HCT 42.4 36.6* 35.0*  MCV 97.2 96.6 96.4  PLT 72* 55* 55*    Cardiac Enzymes: No results for input(s): "CKTOTAL", "CKMB", "CKMBINDEX", "TROPONINI" in the last 168 hours. BNP (last 3 results) No results for input(s): "PROBNP" in the last 8760 hours. CBG: Recent Labs  Lab 01/04/22 1228  GLUCAP 92    D-Dimer: No results for input(s): "DDIMER" in the last 72 hours. Hgb A1c: No results for input(s): "HGBA1C" in the last 72 hours. Lipid Profile: No results for input(s): "CHOL", "HDL", "LDLCALC", "TRIG", "CHOLHDL", "LDLDIRECT" in the last 72 hours. Thyroid function studies: No results for input(s): "TSH", "T4TOTAL", "T3FREE", "THYROIDAB" in the last 72 hours.  Invalid input(s): "FREET3" Anemia work up: No results for input(s): "VITAMINB12", "FOLATE", "FERRITIN", "TIBC", "IRON", "RETICCTPCT" in the  last 72 hours. Sepsis Labs: Recent Labs  Lab 01/04/22 1225 01/07/22 1005 01/08/22 0208  WBC 7.2 7.0 6.9    Microbiology Recent Results (from the past 240 hour(s))  Resp Panel by RT-PCR (Flu A&B, Covid) Anterior Nasal Swab     Status: None   Collection Time: 01/04/22 12:10 PM   Specimen: Anterior Nasal Swab  Result Value Ref Range Status   SARS Coronavirus 2 by RT PCR NEGATIVE NEGATIVE Final    Comment: (NOTE) SARS-CoV-2 target nucleic acids are NOT DETECTED.  The SARS-CoV-2 RNA is generally detectable in upper respiratory specimens during the acute phase of infection. The lowest concentration of SARS-CoV-2 viral copies this assay can detect is 138 copies/mL. A negative result does not preclude SARS-Cov-2 infection and should not be used as the sole basis for treatment or other patient management decisions. A negative result may occur with  improper specimen collection/handling, submission of specimen other than nasopharyngeal swab, presence of viral mutation(s) within the areas targeted by this assay, and inadequate number of viral copies(<138 copies/mL). A negative result must be combined with clinical observations, patient history, and epidemiological information. The expected result is Negative.  Fact Sheet for Patients:  EntrepreneurPulse.com.au  Fact Sheet for Healthcare Providers:  IncredibleEmployment.be  This test is no t yet approved or cleared by the [place] FDA and  has been authorized for detection and/or diagnosis of SARS-CoV-2 by FDA under an Emergency Use Authorization (EUA). This EUA will remain  in effect (meaning this test can be used) for the duration of the COVID-19 declaration under Section 564(b)(1) of the Act, 21 U.S.C.section 360bbb-3(b)(1), unless the authorization is terminated  or revoked sooner.       Influenza A by PCR NEGATIVE NEGATIVE Final   Influenza B by PCR NEGATIVE NEGATIVE Final     Comment: (NOTE) The Xpert Xpress SARS-CoV-2/FLU/RSV plus assay is intended as an aid in the diagnosis of influenza from Nasopharyngeal swab specimens and should not be used as a sole basis for treatment. Nasal washings and aspirates are unacceptable for Xpert Xpress SARS-CoV-2/FLU/RSV testing.  Fact Sheet for Patients: EntrepreneurPulse.com.au  Fact Sheet for Healthcare Providers: IncredibleEmployment.be  This test is not yet approved or cleared by the [place] FDA and has been authorized for detection and/or diagnosis of SARS-CoV-2 by FDA under an Emergency Use Authorization (EUA). This EUA will remain in effect (meaning this test can be used) for the duration of the COVID-19 declaration under Section 564(b)(1) of the Act, 21  U.S.C. section 360bbb-3(b)(1), unless the authorization is terminated or revoked.  Performed at [facility], [place] [address] [place], [place] [address]   Urine Culture     Status: Abnormal   Collection Time: 01/04/22  2:31 PM   Specimen: Urine, Clean Catch  Result Value Ref Range Status   Specimen Description URINE, CLEAN CATCH  Final   Special Requests NONE  Final   Culture (A)  Final    <10,000 COLONIES/mL INSIGNIFICANT GROWTH Performed at [facility], [place] [address], [place], [place] [address]    Report Status 01/05/2022 FINAL  Final     Medications:    amLODipine  2.5 mg Oral Daily   ezetimibe  10 mg Oral Daily   finasteride  5 mg Oral Daily   levothyroxine  112 mcg Oral Q0600   melatonin  3 mg Oral QHS   memantine  10 mg Oral BID   mirtazapine  15 mg Oral QHS   mouth rinse  15 mL Mouth Rinse 4 times per day   pantoprazole  40 mg Oral QAC breakfast   Continuous Infusions:  fluconazole (DIFLUCAN) IV Stopped (01/08/22 1137)   sodium bicarbonate 150 mEq in dextrose 5 % 1,150 mL infusion 75 mL/hr at 01/09/22 0648       LOS: 4 days   Charlynne Cousins  [facility]  01/09/2022, 10:17 AM

## 2022-01-09 NOTE — Care Management Important Message (Signed)
Important Message  Patient Details  Name: Patrick [name] MRN: [id number] Date of Birth: 1926-09-28   Medicare Important Message Given:  Yes     Shalea Tomczak 01/09/2022, 1:44 PM

## 2022-01-09 NOTE — Plan of Care (Signed)
  Problem: Clinical Measurements: Goal: Diagnostic test results will improve Outcome: Progressing Goal: Cardiovascular complication will be avoided Outcome: Progressing   Problem: Coping: Goal: Level of anxiety will decrease Outcome: Progressing   Problem: Elimination: Goal: Will not experience complications related to urinary retention Outcome: Progressing   Problem: Pain Managment: Goal: General experience of comfort will improve Outcome: Progressing   Problem: Safety: Goal: Ability to remain free from injury will improve Outcome: Progressing   Problem: Education: Goal: Knowledge of General Education information will improve Description: Including pain rating scale, medication(s)/side effects and non-pharmacologic comfort measures Outcome: Not Progressing   Problem: Health Behavior/Discharge Planning: Goal: Ability to manage health-related needs will improve Outcome: Not Progressing   Problem: Clinical Measurements: Goal: Ability to maintain clinical measurements within normal limits will improve Outcome: Not Progressing Goal: Will remain free from infection Outcome: Not Progressing Goal: Respiratory complications will improve Outcome: Not Progressing   Problem: Activity: Goal: Risk for activity intolerance will decrease Outcome: Not Progressing   Problem: Nutrition: Goal: Adequate nutrition will be maintained Outcome: Not Progressing   Problem: Elimination: Goal: Will not experience complications related to bowel motility Outcome: Not Progressing

## 2022-01-09 NOTE — Progress Notes (Signed)
HOSPITAL MEDICINE OVERNIGHT EVENT NOTE    Patient exhibiting significant coarse breath sounds due to secretions per nursing.  Unfortunately this has not been amenable to suctioning.  Nursing requesting order for as needed glycopyrrolate, order has been placed.  Furthermore, nursing reports the patient has a mild fever of 100.4 F.  In an effort to address discomfort associated with this fever will order as needed Tylenol suppositories as well.  Continuing comfort measures.  Vernelle Emerald  MD [facility]

## 2022-01-10 DIAGNOSIS — R0989 Other specified symptoms and signs involving the circulatory and respiratory systems: Secondary | ICD-10-CM

## 2022-01-10 DIAGNOSIS — F05 Delirium due to known physiological condition: Secondary | ICD-10-CM | POA: Diagnosis not present

## 2022-01-10 DIAGNOSIS — I10 Essential (primary) hypertension: Secondary | ICD-10-CM | POA: Diagnosis not present

## 2022-01-10 DIAGNOSIS — Z515 Encounter for palliative care: Secondary | ICD-10-CM

## 2022-01-10 DIAGNOSIS — R262 Difficulty in walking, not elsewhere classified: Secondary | ICD-10-CM

## 2022-01-10 DIAGNOSIS — E440 Moderate protein-calorie malnutrition: Secondary | ICD-10-CM

## 2022-01-10 DIAGNOSIS — S065XAA Traumatic subdural hemorrhage with loss of consciousness status unknown, initial encounter: Secondary | ICD-10-CM | POA: Diagnosis not present

## 2022-01-10 MED ORDER — SCOPOLAMINE 1 MG/3DAYS TD PT72
1.0000 | MEDICATED_PATCH | TRANSDERMAL | Status: DC
  Administered 2022-01-10: 1.5 mg via TRANSDERMAL
  Filled 2022-01-10: qty 1

## 2022-01-10 MED ORDER — GLYCOPYRROLATE 0.2 MG/ML IJ SOLN
0.2000 mg | INTRAMUSCULAR | Status: DC | PRN
Start: 2022-01-10 — End: 2022-01-10
  Administered 2022-01-10 (×2): 0.2 mg via INTRAVENOUS
  Filled 2022-01-10: qty 1

## 2022-01-10 MED ORDER — GLYCOPYRROLATE 0.2 MG/ML IJ SOLN
0.4000 mg | INTRAMUSCULAR | Status: DC | PRN
Start: 2022-01-10 — End: 2022-01-11
  Administered 2022-01-10 – 2022-01-11 (×4): 0.4 mg via INTRAVENOUS
  Filled 2022-01-10: qty 2

## 2022-01-10 MED ORDER — GLYCOPYRROLATE 0.2 MG/ML IJ SOLN
0.2000 mg | INTRAMUSCULAR | Status: DC
  Administered 2022-01-10 – 2022-01-11 (×3): 0.2 mg via INTRAVENOUS
  Filled 2022-01-10 (×3): qty 1

## 2022-01-10 NOTE — Progress Notes (Signed)
Patient WV:PXTGGYI TAYM TWIST      DOB: 14-May-1927      RSW:[id number]      Palliative Medicine Team    Subjective: Bedside symptom check completed. Wife bedside at time of visit.   Physical exam: Patient resting in bed with eyes closed at time of visit. Breathing even and non-labored, no excessive secretions noted. Patient without physical or non-verbal signs of pain or discomfort at this time. Extremities warm to touch with palpable distal pulses noted. Patient unresponsive to verbal or tactile stimulation.   Assessment and plan: Wife wishes to move patient as soon as possible to residential hospice with preference to [facility]. This RN reached out to case manager/LCSW and hospice liaison to make aware. No other needs were identified at this time. Bedside RN Ty without additional needs or concerns. Patient stable this morning for transfer if residential bed is offered. Will continue to follow for any changes or advances.    Thank you for allowing the Palliative Medicine Team to assist in the care of this patient.     Damian Leavell, MSN, RN Palliative Medicine Team Team Phone: [phone]  This phone is monitored 7a-7p, please reach out to attending physician outside of these hours for urgent needs.

## 2022-01-10 NOTE — Progress Notes (Signed)
Inpatient Rehab Admissions Coordinator:   Got report from [facility] that patient is going comfort care. Will sign off.   Rehab Admissons Coordinator [place], [place], [place] [phone]

## 2022-01-10 NOTE — Progress Notes (Addendum)
Brief Palliative Medicine Progress Note:  Primary RN concerned about patient's breakthrough respiratory secretions despite scheduled and PRN medication administration.  Chart review performed - patient full comfort measures.  Scheduled robinul q4h; adjusted PRN dose from 0.2 to 0.4 q2h PRN.  Thank you for allowing [facility] to assist in the care of this patient.  Charnese Federici M. [facility] Palliative Medicine Team Team Phone: [phone] NO CHARGE

## 2022-01-10 NOTE — Plan of Care (Signed)
Patient transitioned to Comfort measures 8/28- reason for outcomes not met  Problem: Education: Goal: Knowledge of General Education information will improve Description: Including pain rating scale, medication(s)/side effects and non-pharmacologic comfort measures Outcome: Not Met (add Reason)   Problem: Health Behavior/Discharge Planning: Goal: Ability to manage health-related needs will improve Outcome: Not Met (add Reason)   Problem: Clinical Measurements: Goal: Ability to maintain clinical measurements within normal limits will improve Outcome: Not Met (add Reason) Goal: Will remain free from infection Outcome: Not Met (add Reason) Goal: Diagnostic test results will improve Outcome: Not Met (add Reason) Goal: Respiratory complications will improve Outcome: Not Met (add Reason) Goal: Cardiovascular complication will be avoided Outcome: Not Met (add Reason)   Problem: Activity: Goal: Risk for activity intolerance will decrease Outcome: Not Met (add Reason)   Problem: Nutrition: Goal: Adequate nutrition will be maintained Outcome: Not Met (add Reason)   Problem: Safety: Goal: Ability to remain free from injury will improve Outcome: Not Met (add Reason)   Problem: Skin Integrity: Goal: Risk for impaired skin integrity will decrease Outcome: Not Met (add Reason)   Problem: Education: Goal: Knowledge of disease or condition will improve Outcome: Not Met (add Reason) Goal: Knowledge of secondary prevention will improve (SELECT ALL) Outcome: Not Met (add Reason) Goal: Knowledge of patient specific risk factors will improve (INDIVIDUALIZE FOR PATIENT) Outcome: Not Met (add Reason) Goal: Individualized Educational Video(s) Outcome: Not Met (add Reason)   Problem: Health Behavior/Discharge Planning: Goal: Ability to manage health-related needs will improve Outcome: Not Met (add Reason)   Problem: Self-Care: Goal: Ability to participate in self-care as condition permits  will improve Outcome: Not Met (add Reason) Goal: Verbalization of feelings and concerns over difficulty with self-care will improve Outcome: Not Met (add Reason) Goal: Ability to communicate needs accurately will improve Outcome: Not Met (add Reason)   Problem: Nutrition: Goal: Risk of aspiration will decrease Outcome: Not Met (add Reason) Goal: Dietary intake will improve Outcome: Not Met (add Reason)   Problem: Spontaneous Subarachnoid Hemorrhage Tissue Perfusion: Goal: Complications of Spontaneous Subarachnoid Hemorrhage will be minimized Outcome: Not Met (add Reason)    Problem: Coping: Goal: Level of anxiety will decrease Outcome: Progressing   Problem: Elimination: Goal: Will not experience complications related to bowel motility Outcome: Progressing Goal: Will not experience complications related to urinary retention Outcome: Progressing   Problem: Pain Managment: Goal: General experience of comfort will improve Outcome: Progressing

## 2022-01-10 NOTE — TOC Initial Note (Signed)
Transition of Care Patrick [name]) - Initial/Assessment Note    Patient Details  Name: Patrick [name] MRN: [id number] Date of Birth: [birth year]  Transition of Care [name]) CM/SW Contact:    Patrick Ochs, LCSW Phone Number: 01/10/2022, 2:08 PM  Clinical Narrative:            CSW updated by palliative medicine team and RN that patient's spouse interested in transition to comfort care, preference for [name]. CSW spoke with Patrick [name] liaison about referral, there is no bed available today but they will follow. CSW to follow.       Expected Discharge Plan: Patrick [name] Barriers to Discharge: Patrick Bed not available   Patient Goals and CMS Choice Patient states their goals for this hospitalization and ongoing recovery are:: patient unable to participate in goal setting, not fully oriented CMS Medicare.gov Compare Post Acute Care list provided to:: Patient Represenative (must comment) Choice offered to / list presented to : Spouse  Expected Discharge Plan and Services Expected Discharge Plan: [name]     Post Acute Care Choice: Patrick Living arrangements for the past 2 months: Single Family Home                                      Prior Living Arrangements/Services Living arrangements for the past 2 months: Single Family Home Lives with:: Spouse Patient language and need for interpreter reviewed:: No Do you feel safe going back to the place where you live?: Yes      Need for Family Participation in Patient Care: Yes (Comment) Care giver support system in place?: No (comment)   Criminal Activity/Legal Involvement Pertinent to Current Situation/Hospitalization: No - Comment as needed  Activities of Daily Living Home Assistive Devices/Equipment: Bedside commode/3-in-1, Blood pressure cuff, Cane (specify quad or straight), Eyeglasses, Walker (specify type) ADL Screening (condition at time of admission) Patient's  cognitive ability adequate to safely complete daily activities?: No Is the patient deaf or have difficulty hearing?: No Does the patient have difficulty seeing, even when wearing glasses/contacts?: No Does the patient have difficulty concentrating, remembering, or making decisions?: Yes Patient able to express need for assistance with ADLs?: Yes Does the patient have difficulty dressing or bathing?: Yes Independently performs ADLs?: No Communication: Independent Dressing (OT): Needs assistance Is this a change from baseline?: Pre-admission baseline Grooming: Needs assistance Is this a change from baseline?: Pre-admission baseline Feeding: Needs assistance Is this a change from baseline?: Pre-admission baseline Bathing: Needs assistance Is this a change from baseline?: Pre-admission baseline Toileting: Needs assistance Is this a change from baseline?: Pre-admission baseline In/Out Bed: Needs assistance Is this a change from baseline?: Pre-admission baseline Walks in Home: Independent with device (comment) (rolling/front wheel walker) Does the patient have difficulty walking or climbing stairs?: Yes Weakness of Legs: Both Weakness of Arms/Hands: Both  Permission Sought/Granted Permission sought to share information with : Facility Sport and exercise psychologist, Family Supports Permission granted to share information with : Yes, Verbal Permission Granted  Share Information with NAME: Patrick [name]  Permission granted to share info w AGENCY: Patrick [name]  Permission granted to share info w Relationship: Spouse     Emotional Assessment   Attitude/Demeanor/Rapport: Unable to Assess Affect (typically observed): Unable to Assess Orientation: :  (not oriented) Alcohol / Substance Use: Not Applicable Psych Involvement: No (comment)  Admission diagnosis:  Subdural hematoma ([name]) [S06.5XAA] Ambulatory dysfunction [  R26.2] Patient Active Problem List   Diagnosis Date Noted   Acute  confusional state 01/06/2022   Subdural hematoma ([name]) 01/04/2022   UTI (urinary tract infection) 12/21/2021   Anxiety disorder due to medical condition 12/21/2021   Constipation 12/21/2021   Dysfunctional voiding of urine 12/21/2021   Malnutrition of moderate degree 12/14/2021   Subarachnoid hematoma with loss of consciousness, sequela ([name]) 12/09/2021   Frequent falls 12/06/2021   Intracranial hemorrhage ([name]) 12/05/2021   Transient LOC (loss of consciousness) 11/08/2021   Orthostatic hypotension    Cerebral parenchymal hemorrhage ([name]) 11/07/2021   Stage 3a chronic kidney disease (CKD) ([name]) 11/07/2021   Hypertensive urgency 11/07/2021   Chronic pain 11/07/2021   Prolonged QT interval 11/07/2021   Food impaction of esophagus 06/28/2021   Memory disorder 12/17/2019   Bilateral foot-drop 12/17/2019   Gait abnormality 12/17/2019   Tremor, essential 12/17/2019   AKI (acute kidney injury) ([name]) 06/23/2019   Chest pain, rule out acute myocardial infarction 06/23/2019   Diverticulitis of intestine with perforation and abscess without bleeding 02/26/2019   Hypokalemia 02/26/2019   Occult blood positive stool 02/26/2019   Acute diverticulitis 02/19/2019   Thrombocytopenia ([name]) 11/19/2018   Unstable angina ([name]) 11/14/2018   Essential (primary) hypertension 02/25/2018   Claudication ([name]) 04/21/2016   History of cold sores 03/22/2011   Coronary artery disease involving native coronary artery of native heart without angina pectoris 10/28/2010   Hyperlipemia 10/28/2010   Hypothyroidism 10/28/2010   PCP:  Patrick [name]., MD Pharmacy:   [facility] - [place], [place] [address] Phone: [phone] Fax: [phone]  [facility]) [place], [place] [address] Phone: [phone] Fax: [phone]  [facility] [phone] [place] [address] [place] [address] Phone: [phone] Fax: [id number] - [place], [place] - [address] AT [place] [address] [place] [address] Phone: [phone] Fax: [phone]  [facility] [address] [name] [address] Phone: [phone] Fax: [phone]     Social Determinants of Health (SDOH) Interventions    Readmission Risk Interventions     No data to display

## 2022-01-10 NOTE — Progress Notes (Addendum)
[facility] PROGRESS NOTE    Progress Note  Patrick [name]  QJF:[id number] DOB: Apr 09, 1927 DOA: 01/04/2022 PCP: Ginger Organ., MD     Brief Narrative:   Patrick [name] is an 88 y.o. male past medical history significant for subarachnoid hemorrhage after fall, orthostatic hypotension poorly controlled hypertension chronic urinary retention secondary to BPH dementia, chronic thrombocytopenia is brought in by family for increased weakness and poor appetite.  In the ED,CT of the head on admission showed increase size 8 mm thick subdural hematoma and a decrease in size and 10 mm thick left mixed subdural hematoma compatible with acute recent collection.  Neurosurgery was consulted who deemed him not a candidate for surgical intervention Repeated CT scan shows stable subdural hematoma, with mild intracranial mass effect no midline shift. Palliative care met with family and patient they want to continue at the measures.   Assessment/Plan:   Acute on subacute subdural hematoma ([place]): Secondary to frequent falls, including the most recent was 6 days ago. Per neurosurgery not a candidate for surgical intervention.   I have spoken to them talk with him that he will not survive CPR they seem to understand the risk and benefits. Repeated CT scan showed worsening hematoma overlying both hemisphere with left increase in size compared to previous CT scan on 01/05/2022 now measuring 20 mm with mass effect slightly increased. This was discussed with the the son and they both decided to move towards comfort care. All medications were stopped they are not related to comfort. He was started on morphine drip. They would like the patient to be placed at a residential hospice facility at [facility] . Awaiting residential hospice facility placement.  Acute confusional state: Worsening CT of the head see above for further details..  Acute on chronic urinary retention: Noted  Hypertensive  emergency: Discontinue all medications. Ambulatory dysfunction: Noted  BPH: Chronic thrombocytopenia: Hypokalemia:   DVT prophylaxis: SCD Family Communication:none Status is: Observation The patient remains OBS appropriate and will d/c before 2 midnights.    Code Status:     Code Status Orders  (From admission, onward)           Start     Ordered   01/04/22 1819  Full code  Continuous        01/04/22 1820           Code Status History     Date Active Date Inactive Code Status Order ID Comments User Context   12/09/2021 1601 12/22/2021 1610 Full Code [id number]  Flora Lipps Inpatient   12/06/2021 2250 12/09/2021 1540 Full Code [id number]  Shela Leff, MD Inpatient   11/07/2021 2243 11/09/2021 2233 Full Code [id number]  Vianne Bulls, MD ED   06/23/2019 0142 06/24/2019 2243 Full Code [id number]  Etta Quill, DO ED   02/26/2019 2201 02/28/2019 1532 Full Code [id number]  Toy Baker, MD Inpatient   02/20/2019 1128 02/21/2019 1911 Full Code [id number]  Mercy Riding, MD Inpatient   02/19/2019 1938 02/20/2019 1128 DNR [id number]  Lavina Hamman, MD Inpatient   02/19/2019 1621 02/19/2019 1938 Full Code [id number]  Lavina Hamman, MD Inpatient   11/14/2018 2045 11/19/2018 1619 Full Code [id number]  Isaiah Serge, NP Inpatient      Advance Directive Documentation    Flowsheet Row Most Recent Value  Type of Advance Directive Healthcare Power of Attorney  Pre-existing out of facility DNR order (yellow form or pink MOST form) --  "MOST" Form  in Place? --         IV Access:   Peripheral IV   Procedures and diagnostic studies:   CT HEAD WO CONTRAST (5MM)  Result Date: 01/09/2022 CLINICAL DATA:  Provided history: Subdural hemorrhage. EXAM: CT HEAD WITHOUT CONTRAST TECHNIQUE: Contiguous axial images were obtained from the base of the skull through the vertex without intravenous contrast. RADIATION DOSE REDUCTION: This exam was performed according to the  departmental dose-optimization program which includes automated exposure control, adjustment of the mA and/or kV according to patient size and/or use of iterative reconstruction technique. COMPARISON:  Prior head CT examinations 01/05/2022 and earlier. FINDINGS: Brain: Mild generalized parenchymal atrophy. Redemonstrated mixed density subdural hematomas overlying both cerebral hemispheres. The subdural hematoma overlying the right cerebral hemisphere has not significantly changed in size, again measuring 13 mm in greatest thickness. The subdural hematoma overlying left cerebral hemisphere has increased in size, now measuring 20 mm in greatest thickness (previously 16 mm, remeasured on prior). Mass effect upon the underlying left frontal lobe (greatest uponon the left frontal lobe) has slightly increased. 4 mm rightward midline shift measured at the level of the septum pellucidum, unchanged. The basal cisterns remain patent. Redemonstrated chronic lacunar infarct within the right corona radiata/basal ganglia. Background mild patchy and ill-defined hypoattenuation within the cerebral white matter, nonspecific but compatible with chronic small vessel ischemic disease. No demarcated cortical infarct. No evidence of an intracranial mass. Vascular: No hyperdense vessel. Atherosclerotic calcifications. Skull: No fracture or aggressive osseous lesion. Sinuses/Orbits: No mass or acute finding within the imaged orbits. Trace mucosal thickening within the bilateral maxillary and left ethmoid sinuses. Other: Small-volume fluid within the right mastoid air cells. Impression #1 will be called to the ordering clinician or representative by the Radiologist Assistant, and communication documented in the PACS or [employer]. IMPRESSION: Redemonstrated mixed density subdural hematomas overlying both cerebral hemispheres. The subdural hematoma overlying the left cerebral hemisphere has increased in size since the head CT of  01/05/2022, now measuring 20 mm in greatest thickness (previously 16 mm). Mass effect upon the underlying left cerebral hemisphere has slightly increased. However, 4 mm rightward midline shift is unchanged. The subdural hematoma overlying the right cerebral hemisphere is unchanged in size. Background parenchymal atrophy and chronic small vessel ischemic disease, stable. Electronically Signed   By: Kellie Simmering D.O.   On: 01/09/2022 17:08     Medical Consultants:   None.   Subjective:    Patrick [name] obtunded  Objective:    Vitals:   01/09/22 0805 01/09/22 1154 01/09/22 1930 01/10/22 0841  BP: (!) 172/68 (!) 195/95  138/66  Pulse: 95 (!) 109 (!) 115 (!) 105  Resp:  18 (!) 28 17  Temp: 98.7 F (37.1 C) 98.8 F (37.1 C) (!) 100.4 F (38 C) 98.6 F (37 C)  TempSrc: Oral Oral Axillary Oral  SpO2: 96% 97%    Weight:      Height:       SpO2: 97 % O2 Flow Rate (L/min): 2 L/min   Intake/Output Summary (Last 24 hours) at 01/10/2022 1201 Last data filed at 01/09/2022 1602 Gross per 24 hour  Intake 742.5 ml  Output --  Net 742.5 ml    Filed Weights   01/05/22 2006 01/06/22 0031 01/07/22 0439  Weight: 63.2 kg 62.4 kg 66.8 kg    Exam: General exam: In no acute distress. Respiratory system: Good air movement and clear to auscultation. Cardiovascular system: S1 & S2 heard, RRR. No JVD. Gastrointestinal system:  Abdomen is nondistended, soft and nontender.  Extremities: No pedal edema. Skin: No rashes, lesions or ulcers  Data Reviewed:    Labs: Basic Metabolic Panel: Recent Labs  Lab 01/04/22 1225 01/07/22 1005 01/08/22 0208 01/09/22 0402  NA 138 138 137 134*  K 3.4* 3.2* 3.5 3.6  CL 104 111 110 105  CO2 25 19* 18* 17*  GLUCOSE 105* 90 81 102*  BUN _0 CREATININE 1.00 0.87 0.90 0.93  CALCIUM 8.7* 7.9* 7.9* 8.2*    GFR Estimated Creatinine Clearance: 47.9 mL/min (by C-G formula based on SCr of 0.93 mg/dL). Liver Function Tests: Recent Labs  Lab  01/04/22 1225  AST 17  ALT 15  ALKPHOS 55  BILITOT 1.3*  PROT 6.3*  ALBUMIN 3.7    No results for input(s): "LIPASE", "AMYLASE" in the last 168 hours. No results for input(s): "AMMONIA" in the last 168 hours. Coagulation profile No results for input(s): "INR", "PROTIME" in the last 168 hours. COVID-19 Labs  No results for input(s): "DDIMER", "FERRITIN", "LDH", "CRP" in the last 72 hours.  Lab Results  Component Value Date   SARSCOV2NAA NEGATIVE 01/04/2022   SARSCOV2NAA NEGATIVE 06/28/2021   SARSCOV2NAA NEGATIVE 06/23/2019   [place] NEGATIVE 03/25/2019    CBC: Recent Labs  Lab 01/04/22 1225 01/07/22 1005 01/08/22 0208  WBC 7.2 7.0 6.9  NEUTROABS 3.5 4.8 4.5  HGB 14.1 12.1* 11.6*  HCT 42.4 36.6* 35.0*  MCV 97.2 96.6 96.4  PLT 72* 55* 55*    Cardiac Enzymes: No results for input(s): "CKTOTAL", "CKMB", "CKMBINDEX", "TROPONINI" in the last 168 hours. BNP (last 3 results) No results for input(s): "PROBNP" in the last 8760 hours. CBG: Recent Labs  Lab 01/04/22 1228  GLUCAP 92    D-Dimer: No results for input(s): "DDIMER" in the last 72 hours. Hgb A1c: No results for input(s): "HGBA1C" in the last 72 hours. Lipid Profile: No results for input(s): "CHOL", "HDL", "LDLCALC", "TRIG", "CHOLHDL", "LDLDIRECT" in the last 72 hours. Thyroid function studies: No results for input(s): "TSH", "T4TOTAL", "T3FREE", "THYROIDAB" in the last 72 hours.  Invalid input(s): "FREET3" Anemia work up: No results for input(s): "VITAMINB12", "FOLATE", "FERRITIN", "TIBC", "IRON", "RETICCTPCT" in the last 72 hours. Sepsis Labs: Recent Labs  Lab 01/04/22 1225 01/07/22 1005 01/08/22 0208  WBC 7.2 7.0 6.9    Microbiology Recent Results (from the past 240 hour(s))  Resp Panel by RT-PCR (Flu A&B, Covid) Anterior Nasal Swab     Status: None   Collection Time: 01/04/22 12:10 PM   Specimen: Anterior Nasal Swab  Result Value Ref Range Status   SARS Coronavirus 2 by RT PCR  NEGATIVE NEGATIVE Final    Comment: (NOTE) SARS-CoV-2 target nucleic acids are NOT DETECTED.  The SARS-CoV-2 RNA is generally detectable in upper respiratory specimens during the acute phase of infection. The lowest concentration of SARS-CoV-2 viral copies this assay can detect is 138 copies/mL. A negative result does not preclude SARS-Cov-2 infection and should not be used as the sole basis for treatment or other patient management decisions. A negative result may occur with  improper specimen collection/handling, submission of specimen other than nasopharyngeal swab, presence of viral mutation(s) within the areas targeted by this assay, and inadequate number of viral copies(<138 copies/mL). A negative result must be combined with clinical observations, patient history, and epidemiological information. The expected result is Negative.  Fact Sheet for Patients:  EntrepreneurPulse.com.au  Fact Sheet for Healthcare Providers:  IncredibleEmployment.be  This test is no t yet approved  or cleared by the [place] and  has been authorized for detection and/or diagnosis of SARS-CoV-2 by FDA under an Emergency Use Authorization (EUA). This EUA will remain  in effect (meaning this test can be used) for the duration of the COVID-19 declaration under Section 564(b)(1) of the Act, 21 U.S.C.section 360bbb-3(b)(1), unless the authorization is terminated  or revoked sooner.       Influenza A by PCR NEGATIVE NEGATIVE Final   Influenza B by PCR NEGATIVE NEGATIVE Final    Comment: (NOTE) The Xpert Xpress SARS-CoV-2/FLU/RSV plus assay is intended as an aid in the diagnosis of influenza from Nasopharyngeal swab specimens and should not be used as a sole basis for treatment. Nasal washings and aspirates are unacceptable for Xpert Xpress SARS-CoV-2/FLU/RSV testing.  Fact Sheet for Patients: EntrepreneurPulse.com.au  Fact Sheet for  Healthcare Providers: IncredibleEmployment.be  This test is not yet approved or cleared by the [place] FDA and has been authorized for detection and/or diagnosis of SARS-CoV-2 by FDA under an Emergency Use Authorization (EUA). This EUA will remain in effect (meaning this test can be used) for the duration of the COVID-19 declaration under Section 564(b)(1) of the Act, 21 U.S.C. section 360bbb-3(b)(1), unless the authorization is terminated or revoked.  Performed at [facility], [place] [address] [place], [place] [address]   Urine Culture     Status: Abnormal   Collection Time: 01/04/22  2:31 PM   Specimen: Urine, Clean Catch  Result Value Ref Range Status   Specimen Description URINE, CLEAN CATCH  Final   Special Requests NONE  Final   Culture (A)  Final    <10,000 COLONIES/mL INSIGNIFICANT GROWTH Performed at [facility], [place] [address] [place], [place] [address]    Report Status 01/05/2022 FINAL  Final     Medications:    sodium chloride flush  3 mL Intravenous Q12H   Continuous Infusions:  sodium chloride 250 mL (01/09/22 1751)   morphine 1 mg/hr (01/09/22 1800)       LOS: 5 days   Charlynne Cousins  [facility]  01/10/2022, 12:01 PM

## 2022-01-11 ENCOUNTER — Inpatient Hospital Stay: Payer: Medicare Other | Attending: Hematology & Oncology | Admitting: Family

## 2022-01-11 ENCOUNTER — Ambulatory Visit: Payer: Medicare Other

## 2022-01-11 ENCOUNTER — Inpatient Hospital Stay: Payer: Medicare Other

## 2022-01-11 DIAGNOSIS — S065XAA Traumatic subdural hemorrhage with loss of consciousness status unknown, initial encounter: Secondary | ICD-10-CM | POA: Diagnosis not present

## 2022-01-11 MED ORDER — LORAZEPAM 1 MG PO TABS: 1.0000 mg | ORAL_TABLET | ORAL | 0 refills | Status: AC | PRN

## 2022-01-11 MED ORDER — MORPHINE BOLUS VIA INFUSION
2.0000 mg | INTRAVENOUS | Status: DC | PRN
  Administered 2022-01-11 (×3): 2 mg via INTRAVENOUS

## 2022-01-11 MED ORDER — ACETAMINOPHEN 650 MG RE SUPP: 650.0000 mg | RECTAL | 0 refills | Status: AC | PRN

## 2022-01-11 MED ORDER — SODIUM CHLORIDE 0.9% FLUSH: 3.0000 mL | Freq: Two times a day (BID) | INTRAVENOUS | Status: AC

## 2022-01-11 MED ORDER — MORPHINE BOLUS VIA INFUSION
1.0000 mg | Freq: Once | INTRAVENOUS | Status: AC
  Administered 2022-01-11: 1 mg via INTRAVENOUS
  Filled 2022-01-11: qty 1

## 2022-01-11 MED ORDER — LORAZEPAM 2 MG/ML PO CONC: 1.0000 mg | ORAL | 0 refills | Status: AC | PRN

## 2022-01-11 MED ORDER — MORPHINE 100MG IN NS 100ML (1MG/ML) PREMIX INFUSION: 1.0000 mg/h | INTRAVENOUS | 0 refills | Status: AC

## 2022-01-11 MED ORDER — SODIUM CHLORIDE 0.9 % IV SOLN
250.0000 mL | INTRAVENOUS | 0 refills | Status: AC | PRN
Start: 2022-01-11 — End: ?

## 2022-01-11 MED ORDER — MORPHINE BOLUS VIA INFUSION: 2.0000 mg | INTRAVENOUS | 0 refills | Status: AC | PRN

## 2022-01-11 MED ORDER — GLYCOPYRROLATE 0.2 MG/ML IJ SOLN: 0.4000 mg | INTRAMUSCULAR | Status: AC | PRN

## 2022-01-11 MED ORDER — SODIUM CHLORIDE 0.9% FLUSH: 3.0000 mL | INTRAVENOUS | Status: AC | PRN

## 2022-01-11 MED ORDER — GLYCOPYRROLATE 0.2 MG/ML IJ SOLN: 0.2000 mg | INTRAMUSCULAR | Status: AC

## 2022-01-11 MED ORDER — HALOPERIDOL 0.5 MG PO TABS: 0.5000 mg | ORAL_TABLET | ORAL | Status: AC | PRN

## 2022-01-11 MED ORDER — HALOPERIDOL LACTATE 5 MG/ML IJ SOLN: 0.5000 mg | INTRAMUSCULAR | Status: AC | PRN

## 2022-01-11 MED ORDER — MORPHINE SULFATE (PF) 2 MG/ML IV SOLN: 1.0000 mg | Freq: Once | INTRAVENOUS | Status: DC

## 2022-01-11 MED ORDER — SCOPOLAMINE 1 MG/3DAYS TD PT72: 1.0000 | MEDICATED_PATCH | TRANSDERMAL | 12 refills | Status: AC

## 2022-01-11 MED ORDER — HALOPERIDOL LACTATE 2 MG/ML PO CONC: 0.5000 mg | ORAL | 0 refills | Status: AC | PRN

## 2022-01-11 MED ORDER — LORAZEPAM 2 MG/ML IJ SOLN: 1.0000 mg | INTRAMUSCULAR | 0 refills | Status: AC | PRN

## 2022-01-11 NOTE — Consult Note (Signed)
   [facility] Inpatient Consult   01/11/2022  [name] 27-Jun-1926 [id number]  [facility] [ACO] Patient: Medicare ACO REACH  Follow up:  [facility] Readmission Report request  Chart reviewed and Patrick [name], per palliative [name] notes, as patient is currently in full comfort measure.  Plan: Report updated. No [facility] [name] Management is planned for transitional needs.   For questions,   [name], RN BSN [facility] Liaison  [phone] business mobile phone Toll free office [phone]  Fax number: [phone] [email or web address]

## 2022-01-11 NOTE — Care Management Important Message (Signed)
Important Message  Patient Details  Name: Patrick [name] MRN: [id number] Date of Birth: [birth year]   Medicare Important Message Given:  Yes     Laine Giovanetti Montine Circle 01/11/2022, 2:53 PM

## 2022-01-11 NOTE — Progress Notes (Signed)
This RN called into patient's room by wife at 78.  Pt jerking, increased RR to 20-30 and repetitive moaning.  Pt appears very uncomfortable.  Wife tearful at bedside.  This RN attempted to medicate per [facility] with PRN medication, however not affective.  This RN reached out to Dr. Cyd Silence regarding pt discomfort.  Medications ordered and plan of care adjusted.  Will continue to monitor closely.  Charge RN aware and providing supportive care for wife.

## 2022-01-11 NOTE — Discharge Summary (Addendum)
DISCHARGE SUMMARY  [place] H Mccraw  MR#: [id number]  DOB:08-28-1926  Date of Admission: 01/04/2022 Date of Discharge: 01/11/2022  Attending Physician:Braian Tijerina Hennie Duos, MD  Patient's WYO:VZCH, Emily Filbert., MD  Consults: Neurosurgery Palliative Care   Disposition: D/C to residential hospice   Discharge Diagnoses: Acute on subacute subdural hematoma  Acute confusional state - Traumatic encephalopathy  Acute on chronic urinary retention Hypertensive emergency Ambulatory dysfunction BPH Chronic thrombocytopenia  Initial presentation: 88 year old with a history of subarachnoid hemorrhage after a fall, chronic orthostatic hypotension, chronic urinary retention secondary to BPH, dementia, and chronic thrombocytopenia who was brought to the ER by his family with progressively worsening generalized weakness and a very poor appetite.  In the ER CT of the head revealed evidence of worsening subdural hematoma with findings suggestive of acute rebleeding.  Neurosurgery was asked to evaluate and deemed him to be in an appropriate candidate for surgical intervention.  There was evidence of mild intracranial mass effect but no midline shift.  Hospital Course: Given the patient's advanced age and his general medical status.  As well as his recurrent worsening subdural hematoma, palliative care was consulted to meet with the family.  The medical team and the family recognized that ongoing aggressive medical interventions would not benefit this patient, and the decision was made to transition to a comfort focused approach.  Arrangements were made for the patient to be admitted to a hospice inpatient facility for ongoing care.  Active issues addressed during this hospital stay include the following:  Acute on subacute subdural hematoma  Secondary to frequent falls, including the most recent 6 days prior to this admission. Per Neurosurgery not a candidate for surgical intervention.   Repeat CT  scan showed worsening hematoma overlying both hemisphere with left increase in size compared to previous CT scan on 01/05/2022 now measuring 20 mm with mass effect slightly increased. This was discussed with the the family and they decided to move towards comfort care. All medications were stopped they are not related to comfort. He was started on a morphine drip. Family requested the patient to be placed at a residential hosp[facility] facility at [facility] .  Acute confusional state - Traumatic encephalopathy  Due to worsening subdural hemorrhage and increasing intracranial pressure.   Acute on chronic urinary retention with BPH   Hypertensive emergency  Ambulatory dysfunction  Chronic thrombocytopenia  Allergies as of 01/11/2022       Reactions   Flomax [tamsulosin] Other (See Comments)   Hypotension, dizziness   Pravachol Other (See Comments)   Unknown reaction    Triazolam Other (See Comments)   "Makes me crazy"        Medication List     STOP taking these medications    busPIRone 5 MG tablet Commonly known as: BUSPAR   diphenhydramine-acetaminophen 25-500 MG Tabs tablet Commonly known as: TYLENOL PM   divalproex 250 MG DR tablet Commonly known as: DEPAKOTE   ezetimibe 10 MG tablet Commonly known as: ZETIA   finasteride 5 MG tablet Commonly known as: PROSCAR   fludrocortisone 0.1 MG tablet Commonly known as: FLORINEF   levothyroxine 112 MCG tablet Commonly known as: SYNTHROID   memantine 10 MG tablet Commonly known as: NAMENDA   mirtazapine 15 MG tablet Commonly known as: REMERON   multivitamin with minerals Tabs tablet   nitroGLYCERIN 0.4 MG SL tablet Commonly known as: NITROSTAT   nystatin 100000 UNIT/ML suspension Commonly known as: MYCOSTATIN   oxyCODONE-acetaminophen 5-325 MG tablet Commonly known as:  PERCOCET/ROXICET   pantoprazole 40 MG tablet Commonly known as: Protonix   polyethylene glycol powder 17 GM/SCOOP powder Commonly known as:  GLYCOLAX/MIRALAX   valACYclovir 1000 MG tablet Commonly known as: VALTREX   vitamin D3 25 MCG tablet Commonly known as: CHOLECALCIFEROL       TAKE these medications    acetaminophen 650 MG suppository Commonly known as: TYLENOL Place 1 suppository (650 mg total) rectally every 4 (four) hours as needed for fever.   glycopyrrolate 0.2 MG/ML injection Commonly known as: ROBINUL Inject 2 mLs (0.4 mg total) into the vein every 2 (two) hours as needed (secretions).   glycopyrrolate 0.2 MG/ML injection Commonly known as: ROBINUL Inject 1 mL (0.2 mg total) into the vein every 4 (four) hours.   haloperidol 0.5 MG tablet Commonly known as: HALDOL Take 1 tablet (0.5 mg total) by mouth every 4 (four) hours as needed for agitation (or delirium).   haloperidol 2 MG/ML solution Commonly known as: HALDOL Place 0.3 mLs (0.6 mg total) under the tongue every 4 (four) hours as needed for agitation (or delirium).   haloperidol lactate 5 MG/ML injection Commonly known as: HALDOL Inject 0.1 mLs (0.5 mg total) into the vein every 4 (four) hours as needed (or delirium).   LORazepam 1 MG tablet Commonly known as: ATIVAN Take 1 tablet (1 mg total) by mouth every 4 (four) hours as needed for anxiety.   LORazepam 2 MG/ML concentrated solution Commonly known as: ATIVAN Place 0.5 mLs (1 mg total) under the tongue every 4 (four) hours as needed for anxiety.   LORazepam 2 MG/ML injection Commonly known as: ATIVAN Inject 0.5 mLs (1 mg total) into the vein every 4 (four) hours as needed for anxiety.   morphine 1 mg/mL Soln infusion Inject 1 mg/hr into the vein continuous.   morphine 5 mg/mL Soln Inject 2 mg into the vein every 15 (fifteen) minutes as needed (uncontrolled pain, distress or if respiratory rate is greater than 25).   scopolamine 1 MG/3DAYS Commonly known as: TRANSDERM-SCOP Place 1 patch (1.5 mg total) onto the skin every 3 (three) days. Start taking on: January 13, 2022    sodium chloride 0.9 % infusion Inject 250 mLs into the vein as needed (for IV line care  (Saline / Heparin Lock)).   sodium chloride flush 0.9 % Soln Commonly known as: NS Inject 3 mLs into the vein every 12 (twelve) hours.   sodium chloride flush 0.9 % Soln Commonly known as: NS Inject 3 mLs into the vein as needed.        Day of Discharge BP 138/66 (BP Location: Right Arm)   Pulse (!) 105   Temp 98.6 F (37 C) (Oral)   Resp 17   Ht 6' (1.829 m)   Wt 66.8 kg   SpO2 97%   BMI 19.97 kg/m   Physical Exam: No evidence of acute distress at time of d/c.   Basic Metabolic Panel: Recent Labs  Lab 01/04/22 1225 01/07/22 1005 01/08/22 0208 01/09/22 0402  NA 138 138 137 134*  K 3.4* 3.2* 3.5 3.6  CL 104 111 110 105  CO2 25 19* 18* 17*  GLUCOSE 105* 90 81 102*  BUN '10 10 9 9  '$ CREATININE 1.00 0.87 0.90 0.93  CALCIUM 8.7* 7.9* 7.9* 8.2*   Time spent in discharge (includes decision making & examination of pt): 30 minutes  01/11/2022, 11:20 AM   Cherene Altes, MD [facility] Office  [phone]

## 2022-01-11 NOTE — Progress Notes (Signed)
Page

## 2022-01-11 NOTE — TOC Transition Note (Signed)
Transition of Care [facility]) - CM/SW Discharge Note   Patient Details  Name: Patrick [name] MRN: [id number] Date of Birth: 1926/12/31  [facility] CM/SW Contact:  Geralynn Ochs, LCSW Phone Number: 01/11/2022, 11:41 AM   Clinical Narrative:   CSW alerted by hospice liaison that there's a bed available for patient today. CSW updated MD about discharge. Hospice home would like patient to arrive at 2, transport arranged for 1:00.  Nurse to call report to [phone].    Final next level of care: [place] Barriers to Discharge: Barriers Resolved   Patient Goals and CMS Choice Patient states their goals for this hospitalization and ongoing recovery are:: patient unable to participate in goal setting, not fully oriented CMS Medicare.gov Compare Post Acute Care list provided to:: Patient Represenative (must comment) Choice offered to / list presented to : Spouse  Discharge Placement                Patient to be transferred to facility by: [facility] Name of family member notified: Spouse Patient and family notified of of transfer: 01/11/22  Discharge Plan and Services     Post Acute Care Choice: Hospice                               Social Determinants of Health (SDOH) Interventions     Readmission Risk Interventions     No data to display

## 2022-01-11 NOTE — Progress Notes (Signed)
[place]:  This pt was referred to Hospice home in [place]--Pt has been approved for the Hospice facility-- Family is in agreement with this transfer and we can accept pt today. Webb Silversmith RN BSN [facility] [phone]

## 2022-01-11 NOTE — Progress Notes (Signed)
Patient discharged to [facility] transported via [place]. Report called to nurse Page, family at bedside aware of plan of care.  Prabhav Faulkenberry, Tivis Ringer, RN

## 2022-01-11 NOTE — Plan of Care (Signed)
Problem: Education: Goal: Knowledge of General Education information will improve Description: Including pain rating scale, medication(s)/side effects and non-pharmacologic comfort measures Outcome: Adequate for Discharge   Problem: Health Behavior/Discharge Planning: Goal: Ability to manage health-related needs will improve Outcome: Adequate for Discharge   Problem: Clinical Measurements: Goal: Ability to maintain clinical measurements within normal limits will improve Outcome: Adequate for Discharge Goal: Will remain free from infection Outcome: Adequate for Discharge Goal: Diagnostic test results will improve Outcome: Adequate for Discharge Goal: Respiratory complications will improve Outcome: Adequate for Discharge Goal: Cardiovascular complication will be avoided Outcome: Adequate for Discharge   Problem: Activity: Goal: Risk for activity intolerance will decrease Outcome: Adequate for Discharge   Problem: Nutrition: Goal: Adequate nutrition will be maintained Outcome: Adequate for Discharge   Problem: Education: Goal: Knowledge of General Education information will improve Description: Including pain rating scale, medication(s)/side effects and non-pharmacologic comfort measures 01/11/2022 1328 by Emmaline Life, RN Outcome: Adequate for Discharge 01/11/2022 1327 by Emmaline Life, RN Outcome: Adequate for Discharge   Problem: Health Behavior/Discharge Planning: Goal: Ability to manage health-related needs will improve 01/11/2022 1328 by Emmaline Life, RN Outcome: Adequate for Discharge 01/11/2022 1327 by Emmaline Life, RN Outcome: Adequate for Discharge   Problem: Clinical Measurements: Goal: Ability to maintain clinical measurements within normal limits will improve 01/11/2022 1328 by Emmaline Life, RN Outcome: Adequate for Discharge 01/11/2022 1327 by Emmaline Life, RN Outcome: Adequate for Discharge Goal: Will remain free from  infection 01/11/2022 1328 by Emmaline Life, RN Outcome: Adequate for Discharge 01/11/2022 1327 by Emmaline Life, RN Outcome: Adequate for Discharge Goal: Diagnostic test results will improve 01/11/2022 1328 by Emmaline Life, RN Outcome: Adequate for Discharge 01/11/2022 1327 by Emmaline Life, RN Outcome: Adequate for Discharge Goal: Respiratory complications will improve 01/11/2022 1328 by Emmaline Life, RN Outcome: Adequate for Discharge 01/11/2022 1327 by Emmaline Life, RN Outcome: Adequate for Discharge Goal: Cardiovascular complication will be avoided 01/11/2022 1328 by Emmaline Life, RN Outcome: Adequate for Discharge 01/11/2022 1327 by Emmaline Life, RN Outcome: Adequate for Discharge   Problem: Activity: Goal: Risk for activity intolerance will decrease 01/11/2022 1328 by Emmaline Life, RN Outcome: Adequate for Discharge 01/11/2022 1327 by Emmaline Life, RN Outcome: Adequate for Discharge   Problem: Nutrition: Goal: Adequate nutrition will be maintained 01/11/2022 1328 by Emmaline Life, RN Outcome: Adequate for Discharge 01/11/2022 1327 by Emmaline Life, RN Outcome: Adequate for Discharge   Problem: Coping: Goal: Level of anxiety will decrease 01/11/2022 1328 by Emmaline Life, RN Outcome: Adequate for Discharge 01/11/2022 1327 by Emmaline Life, RN Outcome: Adequate for Discharge   Problem: Elimination: Goal: Will not experience complications related to bowel motility 01/11/2022 1328 by Emmaline Life, RN Outcome: Adequate for Discharge 01/11/2022 1327 by Emmaline Life, RN Outcome: Adequate for Discharge Goal: Will not experience complications related to urinary retention 01/11/2022 1328 by Emmaline Life, RN Outcome: Adequate for Discharge 01/11/2022 1327 by Emmaline Life, RN Outcome: Adequate for Discharge   Problem: Pain Managment: Goal: General experience of comfort will improve 01/11/2022 1328 by  Emmaline Life, RN Outcome: Adequate for Discharge 01/11/2022 1327 by Emmaline Life, RN Outcome: Adequate for Discharge   Problem: Safety: Goal: Ability to remain free from injury will improve 01/11/2022 1328 by Emmaline Life, RN Outcome: Adequate for Discharge 01/11/2022 1327 by Emmaline Life, RN Outcome: Adequate for Discharge   Problem: Skin Integrity:  Goal: Risk for impaired skin integrity will decrease 01/11/2022 1328 by Emmaline Life, RN Outcome: Adequate for Discharge 01/11/2022 1327 by Emmaline Life, RN Outcome: Adequate for Discharge   Problem: Acute Rehab OT Goals (only OT should resolve) Goal: Pt. Will Perform Grooming Outcome: Adequate for Discharge Goal: Pt. Will Perform Lower Body Bathing Outcome: Adequate for Discharge Goal: Pt. Will Perform Lower Body Dressing Outcome: Adequate for Discharge Goal: Pt. Will Transfer To Toilet Outcome: Adequate for Discharge Goal: Pt. Will Perform Toileting-Clothing Manipulation Outcome: Adequate for Discharge   Problem: Acute Rehab PT Goals(only PT should resolve) Goal: Pt Will Go Supine/Side To Sit Outcome: Adequate for Discharge Goal: Pt Will Go Sit To Supine/Side Outcome: Adequate for Discharge Goal: Patient Will Transfer Sit To/From Stand Outcome: Adequate for Discharge Goal: Pt Will Ambulate Outcome: Adequate for Discharge   Problem: Education: Goal: Knowledge of disease or condition will improve 01/11/2022 1328 by Emmaline Life, RN Outcome: Adequate for Discharge 01/11/2022 1327 by Emmaline Life, RN Outcome: Adequate for Discharge Goal: Knowledge of secondary prevention will improve (SELECT ALL) 01/11/2022 1328 by Emmaline Life, RN Outcome: Adequate for Discharge 01/11/2022 1327 by Emmaline Life, RN Outcome: Adequate for Discharge Goal: Knowledge of patient specific risk factors will improve (INDIVIDUALIZE FOR PATIENT) 01/11/2022 1328 by Emmaline Life, RN Outcome: Adequate for  Discharge 01/11/2022 1327 by Emmaline Life, RN Outcome: Adequate for Discharge Goal: Individualized Educational Video(s) 01/11/2022 1328 by Emmaline Life, RN Outcome: Adequate for Discharge 01/11/2022 1327 by Emmaline Life, RN Outcome: Adequate for Discharge   Problem: Health Behavior/Discharge Planning: Goal: Ability to manage health-related needs will improve 01/11/2022 1328 by Emmaline Life, RN Outcome: Adequate for Discharge 01/11/2022 1327 by Emmaline Life, RN Outcome: Adequate for Discharge   Problem: Self-Care: Goal: Ability to participate in self-care as condition permits will improve 01/11/2022 1328 by Emmaline Life, RN Outcome: Adequate for Discharge 01/11/2022 1327 by Emmaline Life, RN Outcome: Adequate for Discharge Goal: Verbalization of feelings and concerns over difficulty with self-care will improve 01/11/2022 1328 by Emmaline Life, RN Outcome: Adequate for Discharge 01/11/2022 1327 by Emmaline Life, RN Outcome: Adequate for Discharge Goal: Ability to communicate needs accurately will improve 01/11/2022 1328 by Emmaline Life, RN Outcome: Adequate for Discharge 01/11/2022 1327 by Emmaline Life, RN Outcome: Adequate for Discharge   Problem: Nutrition: Goal: Risk of aspiration will decrease 01/11/2022 1328 by Emmaline Life, RN Outcome: Adequate for Discharge 01/11/2022 1327 by Emmaline Life, RN Outcome: Adequate for Discharge Goal: Dietary intake will improve 01/11/2022 1328 by Emmaline Life, RN Outcome: Adequate for Discharge 01/11/2022 1327 by Emmaline Life, RN Outcome: Adequate for Discharge   Problem: Spontaneous Subarachnoid Hemorrhage Tissue Perfusion: Goal: Complications of Spontaneous Subarachnoid Hemorrhage will be minimized 01/11/2022 1328 by Emmaline Life, RN Outcome: Adequate for Discharge 01/11/2022 1327 by Emmaline Life, RN Outcome: Adequate for Discharge

## 2022-01-11 NOTE — Progress Notes (Signed)
Daily Progress Note   Patient Name: Patrick [name]       Date: 01/11/2022 DOB: [birth year]  Age: 88 y.o. MRN#: [id number] Attending Physician: Cherene Altes, MD Primary Care Physician: Ginger Organ., MD Admit Date: 01/04/2022  Reason for Consultation/Follow-up: Establishing goals of care  Subjective: Medical records reviewed. Patient assessed at the bedside. His family are present at the bedside.  Created space and opportunity for family's thoughts and feelings on patient's current illness.  Patient's symptoms are currently well managed and he is comfortable.  No questions or concerns. PMT will continue to support holistically.   Length of Stay: 6   Physical Exam Vitals and nursing note reviewed.  Constitutional:      Appearance: He is ill-appearing.     Interventions: Nasal cannula in place.  Cardiovascular:     Rate and Rhythm: Normal rate.  Pulmonary:     Effort: Pulmonary effort is normal.  Skin:    General: Skin is warm and dry.  Neurological:     Mental Status: He is unresponsive.            Vital Signs: BP 138/66 (BP Location: Right Arm)   Pulse (!) 105   Temp 98.6 F (37 C) (Oral)   Resp 17   Ht 6' (1.829 m)   Wt 66.8 kg   SpO2 97%   BMI 19.97 kg/m  SpO2: SpO2: 97 % O2 Device: O2 Device: Room Air O2 Flow Rate: O2 Flow Rate (L/min): 2 L/min      Palliative Assessment/Data: 10%   Palliative Care Assessment & Plan   Patient Profile: 88 y.o. male  with past medical history of  recent subarachnoid hemorrhage after a fall, orthostatic hypotension, poorly controlled HTN, frequent falls, chronic urinary retention probably secondary to BPH, dementia with behavioral symptoms, chronic thrombocytopenia, chronic peripheral neuropathy, CAD admitted on  01/04/2022 with increased weakness, poor appetite, recurrent falls.    Patient with another fall 6 days ago and admitted with acute on subacute-chronic subdural hematoma. Patient has been admitted 4 times in the past 6 months. PMT has been consulted to assist with goals of care conversation.  Assessment: End of life care  Recommendations/Plan: Continue DNR Continue comfort care measures per [facility] Patient stable for transfer to residential hospice today PMT will continue to follow incrementally  and support as needed   Prognosis:  < 2 weeks  Discharge Planning: Hospice facility  Care plan was discussed with patient's family   Total time: I spent 25 minutes in the care of the patient today in the above activities and documenting the encounter.   Caniyah Murley Johnnette Litter, PA-C  Palliative Medicine Team Team phone # [phone]  Thank you for allowing the Palliative Medicine Team to assist in the care of this patient. Please utilize secure chat with additional questions, if there is no response within 30 minutes please call the above phone number.  Palliative Medicine Team providers are available by phone from 7am to 7pm daily and can be reached through the team cell phone.  Should this patient require assistance outside of these hours, please call the patient's attending physician.

## 2022-01-13 DIAGNOSIS — F03911 Unspecified dementia, unspecified severity, with agitation: Secondary | ICD-10-CM | POA: Diagnosis not present

## 2022-01-13 DIAGNOSIS — G629 Polyneuropathy, unspecified: Secondary | ICD-10-CM | POA: Diagnosis not present

## 2022-01-13 DIAGNOSIS — I951 Orthostatic hypotension: Secondary | ICD-10-CM | POA: Diagnosis not present

## 2022-01-13 DIAGNOSIS — I10 Essential (primary) hypertension: Secondary | ICD-10-CM | POA: Diagnosis not present

## 2022-01-13 DIAGNOSIS — I2510 Atherosclerotic heart disease of native coronary artery without angina pectoris: Secondary | ICD-10-CM | POA: Diagnosis not present

## 2022-01-13 DIAGNOSIS — N401 Enlarged prostate with lower urinary tract symptoms: Secondary | ICD-10-CM | POA: Diagnosis not present

## 2022-01-13 DIAGNOSIS — R339 Retention of urine, unspecified: Secondary | ICD-10-CM | POA: Diagnosis not present

## 2022-01-13 DIAGNOSIS — Z9181 History of falling: Secondary | ICD-10-CM | POA: Diagnosis not present

## 2022-01-13 DIAGNOSIS — D696 Thrombocytopenia, unspecified: Secondary | ICD-10-CM | POA: Diagnosis not present

## 2022-01-13 DIAGNOSIS — S065XAD Traumatic subdural hemorrhage with loss of consciousness status unknown, subsequent encounter: Secondary | ICD-10-CM | POA: Diagnosis not present

## 2022-01-17 ENCOUNTER — Encounter: Payer: Medicare Other | Admitting: Physical Therapy

## 2022-01-19 ENCOUNTER — Encounter: Payer: Medicare Other | Admitting: Physical Therapy

## 2022-01-24 ENCOUNTER — Encounter: Payer: Medicare Other | Admitting: Physical Medicine and Rehabilitation

## 2022-01-25 ENCOUNTER — Encounter: Payer: Medicare Other | Admitting: Physical Therapy

## 2022-01-30 ENCOUNTER — Encounter: Payer: Medicare Other | Admitting: Physical Therapy

## 2022-02-01 ENCOUNTER — Ambulatory Visit: Payer: Medicare Other | Admitting: Neurology

## 2022-02-08 ENCOUNTER — Encounter: Payer: Medicare Other | Admitting: Physical Therapy

## 2022-02-12 DEATH — deceased
